# Patient Record
Sex: Female | Born: 1935 | Race: Black or African American | Hispanic: No | Smoking: Former smoker
Health system: Southern US, Community
[De-identification: ages and names within clinical notes are randomized; demographics above are authoritative.]

## PROBLEM LIST (undated history)

## (undated) DIAGNOSIS — M791 Myalgia, unspecified site: Secondary | ICD-10-CM

## (undated) DIAGNOSIS — I639 Cerebral infarction, unspecified: Secondary | ICD-10-CM

## (undated) DIAGNOSIS — I442 Atrioventricular block, complete: Secondary | ICD-10-CM

## (undated) DIAGNOSIS — Z8542 Personal history of malignant neoplasm of other parts of uterus: Secondary | ICD-10-CM

## (undated) DIAGNOSIS — N183 Chronic kidney disease, stage 3 unspecified: Secondary | ICD-10-CM

## (undated) DIAGNOSIS — I509 Heart failure, unspecified: Secondary | ICD-10-CM

## (undated) DIAGNOSIS — I219 Acute myocardial infarction, unspecified: Secondary | ICD-10-CM

## (undated) DIAGNOSIS — M109 Gout, unspecified: Secondary | ICD-10-CM

## (undated) DIAGNOSIS — D509 Iron deficiency anemia, unspecified: Secondary | ICD-10-CM

## (undated) DIAGNOSIS — R002 Palpitations: Secondary | ICD-10-CM

## (undated) DIAGNOSIS — E1121 Type 2 diabetes mellitus with diabetic nephropathy: Secondary | ICD-10-CM

## (undated) DIAGNOSIS — E785 Hyperlipidemia, unspecified: Secondary | ICD-10-CM

## (undated) DIAGNOSIS — J439 Emphysema, unspecified: Secondary | ICD-10-CM

## (undated) DIAGNOSIS — M1990 Unspecified osteoarthritis, unspecified site: Secondary | ICD-10-CM

## (undated) DIAGNOSIS — H9190 Unspecified hearing loss, unspecified ear: Secondary | ICD-10-CM

## (undated) DIAGNOSIS — I519 Heart disease, unspecified: Secondary | ICD-10-CM

## (undated) DIAGNOSIS — G4730 Sleep apnea, unspecified: Secondary | ICD-10-CM

## (undated) DIAGNOSIS — K219 Gastro-esophageal reflux disease without esophagitis: Secondary | ICD-10-CM

## (undated) DIAGNOSIS — Z860100 Personal history of colon polyps, unspecified: Secondary | ICD-10-CM

## (undated) DIAGNOSIS — I10 Essential (primary) hypertension: Secondary | ICD-10-CM

## (undated) DIAGNOSIS — I2510 Atherosclerotic heart disease of native coronary artery without angina pectoris: Secondary | ICD-10-CM

## (undated) HISTORY — DX: Personal history of colon polyps, unspecified: Z86.0100

## (undated) HISTORY — DX: Atrioventricular block, complete: I44.2

## (undated) HISTORY — DX: Hyperlipidemia, unspecified: E78.5

## (undated) HISTORY — DX: Atherosclerotic heart disease of native coronary artery without angina pectoris: I25.10

## (undated) HISTORY — DX: Acute myocardial infarction, unspecified: I21.9

## (undated) HISTORY — DX: Emphysema, unspecified: J43.9

## (undated) HISTORY — DX: Personal history of colonic polyps: Z86.010

## (undated) HISTORY — DX: Sleep apnea, unspecified: G47.30

## (undated) HISTORY — DX: Myalgia, unspecified site: M79.10

## (undated) HISTORY — PX: CERVICAL DISCECTOMY: SHX98

## (undated) HISTORY — DX: Gout, unspecified: M10.9

## (undated) HISTORY — DX: Iron deficiency anemia, unspecified: D50.9

## (undated) HISTORY — PX: ECTOPIC PREGNANCY SURGERY: SHX613

## (undated) HISTORY — PX: FOOT SURGERY: SHX648

## (undated) HISTORY — DX: Cerebral infarction, unspecified: I63.9

## (undated) HISTORY — PX: CORONARY ANGIOPLASTY: SHX604

## (undated) HISTORY — DX: Palpitations: R00.2

## (undated) HISTORY — DX: Gastro-esophageal reflux disease without esophagitis: K21.9

## (undated) HISTORY — DX: Unspecified osteoarthritis, unspecified site: M19.90

---

## 1996-11-19 DIAGNOSIS — I219 Acute myocardial infarction, unspecified: Secondary | ICD-10-CM

## 1996-11-19 HISTORY — DX: Acute myocardial infarction, unspecified: I21.9

## 2004-10-09 ENCOUNTER — Emergency Department (HOSPITAL_COMMUNITY): Admission: EM | Admit: 2004-10-09 | Discharge: 2004-10-09 | Payer: Self-pay | Admitting: Emergency Medicine

## 2004-11-10 ENCOUNTER — Ambulatory Visit (HOSPITAL_COMMUNITY): Admission: RE | Admit: 2004-11-10 | Discharge: 2004-11-11 | Payer: Self-pay | Admitting: *Deleted

## 2004-12-28 ENCOUNTER — Encounter (HOSPITAL_COMMUNITY): Admission: RE | Admit: 2004-12-28 | Discharge: 2005-01-27 | Payer: Self-pay | Admitting: *Deleted

## 2005-01-10 ENCOUNTER — Emergency Department (HOSPITAL_COMMUNITY): Admission: EM | Admit: 2005-01-10 | Discharge: 2005-01-10 | Payer: Self-pay | Admitting: Emergency Medicine

## 2005-03-14 ENCOUNTER — Ambulatory Visit (HOSPITAL_COMMUNITY): Admission: RE | Admit: 2005-03-14 | Discharge: 2005-03-14 | Payer: Self-pay | Admitting: Family Medicine

## 2005-12-07 ENCOUNTER — Ambulatory Visit (HOSPITAL_COMMUNITY): Admission: RE | Admit: 2005-12-07 | Discharge: 2005-12-07 | Payer: Self-pay | Admitting: *Deleted

## 2005-12-20 ENCOUNTER — Inpatient Hospital Stay (HOSPITAL_COMMUNITY): Admission: RE | Admit: 2005-12-20 | Discharge: 2005-12-22 | Payer: Self-pay | Admitting: *Deleted

## 2005-12-20 ENCOUNTER — Encounter (INDEPENDENT_AMBULATORY_CARE_PROVIDER_SITE_OTHER): Payer: Self-pay | Admitting: Cardiology

## 2006-02-25 ENCOUNTER — Ambulatory Visit: Admission: RE | Admit: 2006-02-25 | Discharge: 2006-02-25 | Payer: Self-pay | Admitting: *Deleted

## 2006-03-14 ENCOUNTER — Ambulatory Visit: Payer: Self-pay | Admitting: Pulmonary Disease

## 2006-03-18 ENCOUNTER — Ambulatory Visit (HOSPITAL_COMMUNITY): Admission: RE | Admit: 2006-03-18 | Discharge: 2006-03-18 | Payer: Self-pay | Admitting: Family Medicine

## 2006-03-18 ENCOUNTER — Ambulatory Visit (HOSPITAL_COMMUNITY): Admission: RE | Admit: 2006-03-18 | Discharge: 2006-03-18 | Payer: Self-pay | Admitting: *Deleted

## 2006-05-30 ENCOUNTER — Ambulatory Visit (HOSPITAL_COMMUNITY): Admission: RE | Admit: 2006-05-30 | Discharge: 2006-05-30 | Payer: Self-pay | Admitting: Family Medicine

## 2006-06-24 ENCOUNTER — Ambulatory Visit: Payer: Self-pay | Admitting: Family Medicine

## 2006-06-24 LAB — CONVERTED CEMR LAB
HCT: 37 %
Hemoglobin: 11.7 g/dL
MCHC: 31.6 g/dL
RDW: 16.5 %

## 2006-06-26 ENCOUNTER — Encounter (INDEPENDENT_AMBULATORY_CARE_PROVIDER_SITE_OTHER): Payer: Self-pay | Admitting: Family Medicine

## 2006-06-26 LAB — CONVERTED CEMR LAB
Ferritin: 25 ng/mL
Saturation Ratios: 23 %
TIBC: 424 ug/dL
UIBC: 325 ug/dL

## 2006-06-27 ENCOUNTER — Encounter (INDEPENDENT_AMBULATORY_CARE_PROVIDER_SITE_OTHER): Payer: Self-pay | Admitting: Family Medicine

## 2006-07-02 ENCOUNTER — Ambulatory Visit: Payer: Self-pay | Admitting: Gastroenterology

## 2006-07-08 ENCOUNTER — Ambulatory Visit: Payer: Self-pay | Admitting: Family Medicine

## 2006-08-06 ENCOUNTER — Ambulatory Visit (HOSPITAL_COMMUNITY): Admission: RE | Admit: 2006-08-06 | Discharge: 2006-08-06 | Payer: Self-pay | Admitting: Family Medicine

## 2006-08-06 ENCOUNTER — Ambulatory Visit: Payer: Self-pay | Admitting: Family Medicine

## 2006-08-26 ENCOUNTER — Ambulatory Visit: Payer: Self-pay | Admitting: Family Medicine

## 2006-09-19 HISTORY — PX: COLONOSCOPY: SHX174

## 2006-10-07 ENCOUNTER — Observation Stay (HOSPITAL_COMMUNITY): Admission: RE | Admit: 2006-10-07 | Discharge: 2006-10-08 | Payer: Self-pay | Admitting: Gastroenterology

## 2006-10-07 ENCOUNTER — Ambulatory Visit: Payer: Self-pay | Admitting: Gastroenterology

## 2006-10-24 ENCOUNTER — Ambulatory Visit: Payer: Self-pay | Admitting: Family Medicine

## 2006-10-24 ENCOUNTER — Ambulatory Visit: Payer: Self-pay | Admitting: Gastroenterology

## 2006-10-29 ENCOUNTER — Encounter: Payer: Self-pay | Admitting: Family Medicine

## 2006-10-29 DIAGNOSIS — I10 Essential (primary) hypertension: Secondary | ICD-10-CM | POA: Insufficient documentation

## 2006-10-29 DIAGNOSIS — I2510 Atherosclerotic heart disease of native coronary artery without angina pectoris: Secondary | ICD-10-CM

## 2006-10-29 DIAGNOSIS — I252 Old myocardial infarction: Secondary | ICD-10-CM

## 2006-10-29 DIAGNOSIS — J309 Allergic rhinitis, unspecified: Secondary | ICD-10-CM | POA: Insufficient documentation

## 2006-10-29 DIAGNOSIS — Z860100 Personal history of colon polyps, unspecified: Secondary | ICD-10-CM | POA: Insufficient documentation

## 2006-10-29 DIAGNOSIS — E782 Mixed hyperlipidemia: Secondary | ICD-10-CM

## 2006-10-29 DIAGNOSIS — K219 Gastro-esophageal reflux disease without esophagitis: Secondary | ICD-10-CM

## 2006-10-29 DIAGNOSIS — M545 Low back pain: Secondary | ICD-10-CM

## 2006-10-29 DIAGNOSIS — M1990 Unspecified osteoarthritis, unspecified site: Secondary | ICD-10-CM | POA: Insufficient documentation

## 2006-10-29 DIAGNOSIS — K5909 Other constipation: Secondary | ICD-10-CM

## 2006-11-15 ENCOUNTER — Ambulatory Visit (HOSPITAL_COMMUNITY): Admission: RE | Admit: 2006-11-15 | Discharge: 2006-11-15 | Payer: Self-pay | Admitting: *Deleted

## 2006-11-21 ENCOUNTER — Inpatient Hospital Stay (HOSPITAL_COMMUNITY): Admission: RE | Admit: 2006-11-21 | Discharge: 2006-11-23 | Payer: Self-pay | Admitting: *Deleted

## 2006-12-09 ENCOUNTER — Emergency Department (HOSPITAL_COMMUNITY): Admission: EM | Admit: 2006-12-09 | Discharge: 2006-12-09 | Payer: Self-pay | Admitting: Emergency Medicine

## 2007-01-08 ENCOUNTER — Encounter (INDEPENDENT_AMBULATORY_CARE_PROVIDER_SITE_OTHER): Payer: Self-pay | Admitting: Family Medicine

## 2007-01-24 ENCOUNTER — Encounter (INDEPENDENT_AMBULATORY_CARE_PROVIDER_SITE_OTHER): Payer: Self-pay | Admitting: Family Medicine

## 2007-01-24 ENCOUNTER — Ambulatory Visit: Payer: Self-pay | Admitting: Gastroenterology

## 2007-01-28 ENCOUNTER — Encounter (HOSPITAL_COMMUNITY): Admission: RE | Admit: 2007-01-28 | Discharge: 2007-02-27 | Payer: Self-pay | Admitting: *Deleted

## 2007-02-06 ENCOUNTER — Encounter (INDEPENDENT_AMBULATORY_CARE_PROVIDER_SITE_OTHER): Payer: Self-pay | Admitting: Family Medicine

## 2007-02-10 ENCOUNTER — Encounter: Payer: Self-pay | Admitting: Internal Medicine

## 2007-02-12 ENCOUNTER — Inpatient Hospital Stay (HOSPITAL_COMMUNITY): Admission: AD | Admit: 2007-02-12 | Discharge: 2007-02-24 | Payer: Self-pay | Admitting: Neurosurgery

## 2007-03-31 ENCOUNTER — Encounter (HOSPITAL_COMMUNITY): Admission: RE | Admit: 2007-03-31 | Discharge: 2007-04-30 | Payer: Self-pay | Admitting: Neurosurgery

## 2007-04-03 ENCOUNTER — Ambulatory Visit (HOSPITAL_COMMUNITY): Admission: RE | Admit: 2007-04-03 | Discharge: 2007-04-03 | Payer: Self-pay | Admitting: Family Medicine

## 2007-04-07 ENCOUNTER — Encounter (INDEPENDENT_AMBULATORY_CARE_PROVIDER_SITE_OTHER): Payer: Self-pay | Admitting: Family Medicine

## 2007-04-08 ENCOUNTER — Ambulatory Visit: Payer: Self-pay | Admitting: Family Medicine

## 2007-04-08 DIAGNOSIS — M5137 Other intervertebral disc degeneration, lumbosacral region: Secondary | ICD-10-CM

## 2007-05-06 ENCOUNTER — Encounter (INDEPENDENT_AMBULATORY_CARE_PROVIDER_SITE_OTHER): Payer: Self-pay | Admitting: Family Medicine

## 2007-05-07 ENCOUNTER — Encounter (INDEPENDENT_AMBULATORY_CARE_PROVIDER_SITE_OTHER): Payer: Self-pay | Admitting: Family Medicine

## 2007-05-12 ENCOUNTER — Encounter (HOSPITAL_COMMUNITY): Admission: RE | Admit: 2007-05-12 | Discharge: 2007-06-11 | Payer: Self-pay | Admitting: Neurosurgery

## 2007-05-14 ENCOUNTER — Encounter (INDEPENDENT_AMBULATORY_CARE_PROVIDER_SITE_OTHER): Payer: Self-pay | Admitting: Family Medicine

## 2007-05-14 ENCOUNTER — Ambulatory Visit (HOSPITAL_COMMUNITY): Admission: RE | Admit: 2007-05-14 | Discharge: 2007-05-14 | Payer: Self-pay | Admitting: *Deleted

## 2007-07-04 ENCOUNTER — Encounter (INDEPENDENT_AMBULATORY_CARE_PROVIDER_SITE_OTHER): Payer: Self-pay | Admitting: Family Medicine

## 2007-07-22 ENCOUNTER — Ambulatory Visit (HOSPITAL_COMMUNITY): Admission: RE | Admit: 2007-07-22 | Discharge: 2007-07-22 | Payer: Self-pay | Admitting: Family Medicine

## 2007-07-23 ENCOUNTER — Encounter (INDEPENDENT_AMBULATORY_CARE_PROVIDER_SITE_OTHER): Payer: Self-pay | Admitting: Family Medicine

## 2007-08-12 ENCOUNTER — Ambulatory Visit: Payer: Self-pay | Admitting: Family Medicine

## 2007-08-12 LAB — CONVERTED CEMR LAB
Hgb A1c MFr Bld: 7 %
LDL Goal: 70 mg/dL

## 2007-08-21 ENCOUNTER — Encounter (INDEPENDENT_AMBULATORY_CARE_PROVIDER_SITE_OTHER): Payer: Self-pay | Admitting: Family Medicine

## 2007-08-25 LAB — CONVERTED CEMR LAB
BUN: 24 mg/dL — ABNORMAL HIGH (ref 6–23)
Basophils Absolute: 0 10*3/uL (ref 0.0–0.1)
Basophils Relative: 0 % (ref 0–1)
Calcium: 9.3 mg/dL (ref 8.4–10.5)
Creatinine, Ser: 1.21 mg/dL — ABNORMAL HIGH (ref 0.40–1.20)
Creatinine, Urine: 84.5 mg/dL
HCT: 34.2 % — ABNORMAL LOW (ref 36.0–46.0)
HDL: 32 mg/dL — ABNORMAL LOW (ref >39)
Lymphocytes Relative: 42 % (ref 12–46)
Microalb Creat Ratio: 43 mg/g — ABNORMAL HIGH (ref 0.0–30.0)
Monocytes Absolute: 0.7 10*3/uL (ref 0.2–0.7)
Platelets: 251 10*3/uL (ref 150–400)
Potassium: 4.3 meq/L (ref 3.5–5.3)
RBC: 4.45 M/uL (ref 3.87–5.11)
Sodium: 144 meq/L (ref 135–145)
TSH: 4.168 microintl units/mL (ref 0.350–5.50)
Total CHOL/HDL Ratio: 3.7
Triglycerides: 82 mg/dL (ref <150)
WBC: 6.7 10*3/uL (ref 4.0–10.5)

## 2007-09-19 ENCOUNTER — Ambulatory Visit (HOSPITAL_COMMUNITY): Admission: RE | Admit: 2007-09-19 | Discharge: 2007-09-19 | Payer: Self-pay | Admitting: Neurosurgery

## 2007-09-23 ENCOUNTER — Ambulatory Visit: Payer: Self-pay | Admitting: Family Medicine

## 2007-09-23 DIAGNOSIS — R809 Proteinuria, unspecified: Secondary | ICD-10-CM | POA: Insufficient documentation

## 2007-09-23 DIAGNOSIS — D539 Nutritional anemia, unspecified: Secondary | ICD-10-CM

## 2007-09-23 LAB — CONVERTED CEMR LAB: Hemoglobin: 9.5 g/dL

## 2007-09-24 ENCOUNTER — Telehealth (INDEPENDENT_AMBULATORY_CARE_PROVIDER_SITE_OTHER): Payer: Self-pay | Admitting: *Deleted

## 2007-09-24 ENCOUNTER — Encounter (INDEPENDENT_AMBULATORY_CARE_PROVIDER_SITE_OTHER): Payer: Self-pay | Admitting: Family Medicine

## 2007-09-24 LAB — CONVERTED CEMR LAB
Iron: 112 ug/dL (ref 42–145)
Retic Ct Pct: 1.3 % (ref 0.4–3.1)
TIBC: 458 ug/dL (ref 250–470)
UIBC: 346 ug/dL

## 2007-09-30 ENCOUNTER — Telehealth (INDEPENDENT_AMBULATORY_CARE_PROVIDER_SITE_OTHER): Payer: Self-pay | Admitting: *Deleted

## 2007-10-09 ENCOUNTER — Encounter (INDEPENDENT_AMBULATORY_CARE_PROVIDER_SITE_OTHER): Payer: Self-pay | Admitting: Family Medicine

## 2007-10-09 ENCOUNTER — Ambulatory Visit (HOSPITAL_COMMUNITY): Admission: RE | Admit: 2007-10-09 | Discharge: 2007-10-09 | Payer: Self-pay | Admitting: *Deleted

## 2007-11-10 ENCOUNTER — Ambulatory Visit: Payer: Self-pay | Admitting: Family Medicine

## 2007-11-10 DIAGNOSIS — R002 Palpitations: Secondary | ICD-10-CM | POA: Insufficient documentation

## 2007-11-11 ENCOUNTER — Encounter (INDEPENDENT_AMBULATORY_CARE_PROVIDER_SITE_OTHER): Payer: Self-pay | Admitting: Family Medicine

## 2007-11-11 ENCOUNTER — Telehealth (INDEPENDENT_AMBULATORY_CARE_PROVIDER_SITE_OTHER): Payer: Self-pay | Admitting: *Deleted

## 2007-11-12 ENCOUNTER — Encounter (INDEPENDENT_AMBULATORY_CARE_PROVIDER_SITE_OTHER): Payer: Self-pay | Admitting: Family Medicine

## 2007-11-20 DIAGNOSIS — I2510 Atherosclerotic heart disease of native coronary artery without angina pectoris: Secondary | ICD-10-CM

## 2007-11-20 HISTORY — DX: Atherosclerotic heart disease of native coronary artery without angina pectoris: I25.10

## 2007-11-24 ENCOUNTER — Telehealth (INDEPENDENT_AMBULATORY_CARE_PROVIDER_SITE_OTHER): Payer: Self-pay | Admitting: Family Medicine

## 2007-12-02 ENCOUNTER — Ambulatory Visit (HOSPITAL_COMMUNITY): Admission: RE | Admit: 2007-12-02 | Discharge: 2007-12-02 | Payer: Self-pay | Admitting: Family Medicine

## 2007-12-02 ENCOUNTER — Encounter (INDEPENDENT_AMBULATORY_CARE_PROVIDER_SITE_OTHER): Payer: Self-pay | Admitting: Family Medicine

## 2007-12-09 ENCOUNTER — Ambulatory Visit: Payer: Self-pay | Admitting: Family Medicine

## 2007-12-10 ENCOUNTER — Telehealth (INDEPENDENT_AMBULATORY_CARE_PROVIDER_SITE_OTHER): Payer: Self-pay | Admitting: *Deleted

## 2007-12-10 ENCOUNTER — Encounter (INDEPENDENT_AMBULATORY_CARE_PROVIDER_SITE_OTHER): Payer: Self-pay | Admitting: Family Medicine

## 2007-12-10 LAB — CONVERTED CEMR LAB
Basophils Absolute: 0 10*3/uL (ref 0.0–0.1)
Basophils Relative: 0 % (ref 0–1)
Eosinophils Absolute: 0.2 10*3/uL (ref 0.0–0.7)
Hemoglobin: 10.3 g/dL — ABNORMAL LOW (ref 12.0–15.0)
MCHC: 31.1 g/dL (ref 30.0–36.0)
MCV: 76.1 fL — ABNORMAL LOW (ref 78.0–100.0)
Monocytes Absolute: 0.6 10*3/uL (ref 0.1–1.0)
Neutro Abs: 2.2 10*3/uL (ref 1.7–7.7)
Neutrophils Relative %: 42 % — ABNORMAL LOW (ref 43–77)
RDW: 15.9 % — ABNORMAL HIGH (ref 11.5–15.5)

## 2007-12-12 ENCOUNTER — Telehealth (INDEPENDENT_AMBULATORY_CARE_PROVIDER_SITE_OTHER): Payer: Self-pay | Admitting: *Deleted

## 2007-12-12 ENCOUNTER — Encounter (INDEPENDENT_AMBULATORY_CARE_PROVIDER_SITE_OTHER): Payer: Self-pay | Admitting: Family Medicine

## 2007-12-15 ENCOUNTER — Encounter (INDEPENDENT_AMBULATORY_CARE_PROVIDER_SITE_OTHER): Payer: Self-pay | Admitting: Family Medicine

## 2008-01-16 ENCOUNTER — Ambulatory Visit: Payer: Self-pay | Admitting: Family Medicine

## 2008-01-16 DIAGNOSIS — IMO0001 Reserved for inherently not codable concepts without codable children: Secondary | ICD-10-CM | POA: Insufficient documentation

## 2008-01-27 ENCOUNTER — Ambulatory Visit (HOSPITAL_COMMUNITY): Admission: RE | Admit: 2008-01-27 | Discharge: 2008-01-27 | Payer: Self-pay | Admitting: *Deleted

## 2008-02-02 ENCOUNTER — Encounter (INDEPENDENT_AMBULATORY_CARE_PROVIDER_SITE_OTHER): Payer: Self-pay | Admitting: Family Medicine

## 2008-02-06 ENCOUNTER — Encounter (INDEPENDENT_AMBULATORY_CARE_PROVIDER_SITE_OTHER): Payer: Self-pay | Admitting: Family Medicine

## 2008-02-26 ENCOUNTER — Encounter (INDEPENDENT_AMBULATORY_CARE_PROVIDER_SITE_OTHER): Payer: Self-pay | Admitting: Family Medicine

## 2008-03-05 ENCOUNTER — Inpatient Hospital Stay (HOSPITAL_COMMUNITY): Admission: EM | Admit: 2008-03-05 | Discharge: 2008-03-14 | Payer: Self-pay | Admitting: Emergency Medicine

## 2008-03-05 ENCOUNTER — Ambulatory Visit: Payer: Self-pay | Admitting: Emergency Medicine

## 2008-03-09 ENCOUNTER — Encounter (INDEPENDENT_AMBULATORY_CARE_PROVIDER_SITE_OTHER): Payer: Self-pay | Admitting: *Deleted

## 2008-03-09 ENCOUNTER — Ambulatory Visit: Payer: Self-pay | Admitting: Vascular Surgery

## 2008-04-21 ENCOUNTER — Encounter (INDEPENDENT_AMBULATORY_CARE_PROVIDER_SITE_OTHER): Payer: Self-pay | Admitting: Family Medicine

## 2008-04-26 ENCOUNTER — Ambulatory Visit: Admission: RE | Admit: 2008-04-26 | Discharge: 2008-04-26 | Payer: Self-pay | Admitting: *Deleted

## 2008-04-30 ENCOUNTER — Ambulatory Visit: Payer: Self-pay | Admitting: Family Medicine

## 2008-04-30 DIAGNOSIS — G4733 Obstructive sleep apnea (adult) (pediatric): Secondary | ICD-10-CM

## 2008-04-30 DIAGNOSIS — I519 Heart disease, unspecified: Secondary | ICD-10-CM

## 2008-04-30 LAB — CONVERTED CEMR LAB: Glucose, Bld: 225 mg/dL

## 2008-05-05 ENCOUNTER — Ambulatory Visit (HOSPITAL_COMMUNITY): Admission: RE | Admit: 2008-05-05 | Discharge: 2008-05-05 | Payer: Self-pay | Admitting: Family Medicine

## 2008-05-10 ENCOUNTER — Ambulatory Visit: Payer: Self-pay | Admitting: Pulmonary Disease

## 2008-05-14 ENCOUNTER — Encounter (INDEPENDENT_AMBULATORY_CARE_PROVIDER_SITE_OTHER): Payer: Self-pay | Admitting: Family Medicine

## 2008-05-15 ENCOUNTER — Encounter: Admission: RE | Admit: 2008-05-15 | Discharge: 2008-05-15 | Payer: Self-pay | Admitting: Neurosurgery

## 2008-05-18 ENCOUNTER — Ambulatory Visit: Payer: Self-pay | Admitting: Family Medicine

## 2008-05-19 ENCOUNTER — Encounter (INDEPENDENT_AMBULATORY_CARE_PROVIDER_SITE_OTHER): Payer: Self-pay | Admitting: Family Medicine

## 2008-05-19 LAB — CONVERTED CEMR LAB
Basophils Relative: 1 % (ref 0–1)
Eosinophils Absolute: 0.3 10*3/uL (ref 0.0–0.7)
Eosinophils Relative: 5 % (ref 0–5)
HCT: 35.3 % — ABNORMAL LOW (ref 36.0–46.0)
Lymphs Abs: 2.2 10*3/uL (ref 0.7–4.0)
MCHC: 30 g/dL (ref 30.0–36.0)
MCV: 75.4 fL — ABNORMAL LOW (ref 78.0–100.0)
Monocytes Absolute: 0.6 10*3/uL (ref 0.1–1.0)
Monocytes Relative: 10 % (ref 3–12)
Neutrophils Relative %: 48 % (ref 43–77)
RBC: 4.68 M/uL (ref 3.87–5.11)
WBC: 6 10*3/uL (ref 4.0–10.5)

## 2008-05-26 ENCOUNTER — Encounter: Admission: RE | Admit: 2008-05-26 | Discharge: 2008-05-26 | Payer: Self-pay | Admitting: Pulmonary Disease

## 2008-07-29 ENCOUNTER — Encounter (INDEPENDENT_AMBULATORY_CARE_PROVIDER_SITE_OTHER): Payer: Self-pay | Admitting: Family Medicine

## 2008-08-17 ENCOUNTER — Encounter (INDEPENDENT_AMBULATORY_CARE_PROVIDER_SITE_OTHER): Payer: Self-pay | Admitting: Family Medicine

## 2008-09-08 ENCOUNTER — Encounter: Admission: RE | Admit: 2008-09-08 | Discharge: 2008-09-08 | Payer: Self-pay | Admitting: Neurosurgery

## 2008-09-20 ENCOUNTER — Encounter: Admission: RE | Admit: 2008-09-20 | Discharge: 2008-09-20 | Payer: Self-pay | Admitting: Neurosurgery

## 2008-09-23 ENCOUNTER — Ambulatory Visit (HOSPITAL_COMMUNITY): Admission: RE | Admit: 2008-09-23 | Discharge: 2008-09-23 | Payer: Self-pay | Admitting: *Deleted

## 2008-09-24 ENCOUNTER — Ambulatory Visit: Payer: Self-pay | Admitting: Family Medicine

## 2008-09-24 LAB — CONVERTED CEMR LAB
Glucose, Bld: 350 mg/dL
Hgb A1c MFr Bld: 10.3 %

## 2008-09-27 ENCOUNTER — Encounter (INDEPENDENT_AMBULATORY_CARE_PROVIDER_SITE_OTHER): Payer: Self-pay | Admitting: Family Medicine

## 2008-11-24 ENCOUNTER — Ambulatory Visit (HOSPITAL_COMMUNITY): Admission: RE | Admit: 2008-11-24 | Discharge: 2008-11-24 | Payer: Self-pay | Admitting: Family Medicine

## 2008-12-03 ENCOUNTER — Ambulatory Visit (HOSPITAL_COMMUNITY): Admission: RE | Admit: 2008-12-03 | Discharge: 2008-12-03 | Payer: Self-pay | Admitting: Family Medicine

## 2008-12-03 ENCOUNTER — Ambulatory Visit: Payer: Self-pay | Admitting: Family Medicine

## 2008-12-16 ENCOUNTER — Ambulatory Visit: Payer: Self-pay | Admitting: Family Medicine

## 2008-12-16 DIAGNOSIS — J42 Unspecified chronic bronchitis: Secondary | ICD-10-CM

## 2008-12-17 ENCOUNTER — Encounter (INDEPENDENT_AMBULATORY_CARE_PROVIDER_SITE_OTHER): Payer: Self-pay | Admitting: Family Medicine

## 2008-12-21 ENCOUNTER — Encounter (INDEPENDENT_AMBULATORY_CARE_PROVIDER_SITE_OTHER): Payer: Self-pay | Admitting: Family Medicine

## 2008-12-21 ENCOUNTER — Ambulatory Visit (HOSPITAL_COMMUNITY): Admission: RE | Admit: 2008-12-21 | Discharge: 2008-12-21 | Payer: Self-pay | Admitting: Family Medicine

## 2008-12-23 ENCOUNTER — Encounter (INDEPENDENT_AMBULATORY_CARE_PROVIDER_SITE_OTHER): Payer: Self-pay | Admitting: Family Medicine

## 2008-12-30 ENCOUNTER — Ambulatory Visit: Payer: Self-pay | Admitting: Family Medicine

## 2008-12-30 DIAGNOSIS — I2789 Other specified pulmonary heart diseases: Secondary | ICD-10-CM | POA: Insufficient documentation

## 2009-01-18 ENCOUNTER — Encounter (INDEPENDENT_AMBULATORY_CARE_PROVIDER_SITE_OTHER): Payer: Self-pay | Admitting: Family Medicine

## 2009-02-09 ENCOUNTER — Encounter (INDEPENDENT_AMBULATORY_CARE_PROVIDER_SITE_OTHER): Payer: Self-pay | Admitting: Family Medicine

## 2009-02-10 ENCOUNTER — Ambulatory Visit: Payer: Self-pay | Admitting: Family Medicine

## 2009-02-24 ENCOUNTER — Ambulatory Visit: Payer: Self-pay | Admitting: Family Medicine

## 2009-02-24 DIAGNOSIS — J4489 Other specified chronic obstructive pulmonary disease: Secondary | ICD-10-CM | POA: Insufficient documentation

## 2009-02-24 DIAGNOSIS — J449 Chronic obstructive pulmonary disease, unspecified: Secondary | ICD-10-CM

## 2009-03-01 LAB — CONVERTED CEMR LAB: Cholesterol: 147 mg/dL

## 2009-03-24 ENCOUNTER — Ambulatory Visit: Payer: Self-pay | Admitting: Family Medicine

## 2009-04-14 ENCOUNTER — Ambulatory Visit: Payer: Self-pay | Admitting: Family Medicine

## 2009-04-14 DIAGNOSIS — R42 Dizziness and giddiness: Secondary | ICD-10-CM

## 2009-04-14 LAB — CONVERTED CEMR LAB
Bilirubin Urine: NEGATIVE
Blood Glucose, Fingerstick: 413
Blood in Urine, dipstick: NEGATIVE
Specific Gravity, Urine: 1.02
Urobilinogen, UA: 0.2
WBC Urine, dipstick: NEGATIVE

## 2009-04-15 ENCOUNTER — Encounter (INDEPENDENT_AMBULATORY_CARE_PROVIDER_SITE_OTHER): Payer: Self-pay | Admitting: Family Medicine

## 2009-04-15 LAB — CONVERTED CEMR LAB
Basophils Absolute: 0 10*3/uL (ref 0.0–0.1)
Basophils Relative: 0 % (ref 0–1)
CO2: 24 meq/L (ref 19–32)
Calcium: 9.4 mg/dL (ref 8.4–10.5)
Creatinine, Ser: 1.45 mg/dL — ABNORMAL HIGH (ref 0.40–1.20)
Eosinophils Absolute: 0.2 10*3/uL (ref 0.0–0.7)
Glucose, Bld: 331 mg/dL — ABNORMAL HIGH (ref 70–99)
MCHC: 30.4 g/dL (ref 30.0–36.0)
MCV: 76.4 fL — ABNORMAL LOW (ref 78.0–100.0)
Monocytes Relative: 12 % (ref 3–12)
Neutrophils Relative %: 53 % (ref 43–77)
Platelets: 235 10*3/uL (ref 150–400)
RBC: 5 M/uL (ref 3.87–5.11)
RDW: 17 % — ABNORMAL HIGH (ref 11.5–15.5)

## 2009-04-27 ENCOUNTER — Encounter (INDEPENDENT_AMBULATORY_CARE_PROVIDER_SITE_OTHER): Payer: Self-pay | Admitting: Family Medicine

## 2009-05-06 ENCOUNTER — Ambulatory Visit: Payer: Self-pay | Admitting: Family Medicine

## 2009-05-06 DIAGNOSIS — D239 Other benign neoplasm of skin, unspecified: Secondary | ICD-10-CM | POA: Insufficient documentation

## 2009-05-20 ENCOUNTER — Ambulatory Visit: Payer: Self-pay | Admitting: Family Medicine

## 2009-05-20 DIAGNOSIS — G609 Hereditary and idiopathic neuropathy, unspecified: Secondary | ICD-10-CM | POA: Insufficient documentation

## 2009-05-20 DIAGNOSIS — E669 Obesity, unspecified: Secondary | ICD-10-CM | POA: Insufficient documentation

## 2009-05-21 ENCOUNTER — Encounter (INDEPENDENT_AMBULATORY_CARE_PROVIDER_SITE_OTHER): Payer: Self-pay | Admitting: Family Medicine

## 2009-05-25 ENCOUNTER — Encounter (INDEPENDENT_AMBULATORY_CARE_PROVIDER_SITE_OTHER): Payer: Self-pay | Admitting: Family Medicine

## 2009-05-25 LAB — CONVERTED CEMR LAB
Creatinine, Urine: 372.7 mg/dL
Iron: 55 ug/dL (ref 42–145)
Lymphocytes Relative: 36 % (ref 12–46)
Lymphs Abs: 2.3 10*3/uL (ref 0.7–4.0)
Microalb, Ur: 122.62 mg/dL — ABNORMAL HIGH (ref 0.00–1.89)
Neutrophils Relative %: 52 % (ref 43–77)
Platelets: 209 10*3/uL (ref 150–400)
Saturation Ratios: 16 % — ABNORMAL LOW (ref 20–55)
UIBC: 298 ug/dL
WBC: 6.5 10*3/uL (ref 4.0–10.5)

## 2009-06-13 ENCOUNTER — Encounter (HOSPITAL_COMMUNITY): Admission: RE | Admit: 2009-06-13 | Discharge: 2009-07-13 | Payer: Self-pay | Admitting: Family Medicine

## 2009-06-24 ENCOUNTER — Ambulatory Visit: Payer: Self-pay | Admitting: Family Medicine

## 2009-07-18 ENCOUNTER — Encounter (HOSPITAL_COMMUNITY): Admission: RE | Admit: 2009-07-18 | Discharge: 2009-08-17 | Payer: Self-pay | Admitting: Family Medicine

## 2009-07-28 ENCOUNTER — Encounter (INDEPENDENT_AMBULATORY_CARE_PROVIDER_SITE_OTHER): Payer: Self-pay | Admitting: Family Medicine

## 2009-07-28 ENCOUNTER — Other Ambulatory Visit: Admission: RE | Admit: 2009-07-28 | Discharge: 2009-07-28 | Payer: Self-pay | Admitting: Family Medicine

## 2009-07-28 ENCOUNTER — Ambulatory Visit: Payer: Self-pay | Admitting: Family Medicine

## 2009-08-01 ENCOUNTER — Ambulatory Visit (HOSPITAL_COMMUNITY): Admission: RE | Admit: 2009-08-01 | Discharge: 2009-08-01 | Payer: Self-pay | Admitting: Family Medicine

## 2009-08-10 ENCOUNTER — Encounter (INDEPENDENT_AMBULATORY_CARE_PROVIDER_SITE_OTHER): Payer: Self-pay | Admitting: Family Medicine

## 2009-08-19 ENCOUNTER — Encounter (HOSPITAL_COMMUNITY): Admission: RE | Admit: 2009-08-19 | Discharge: 2009-09-18 | Payer: Self-pay | Admitting: Family Medicine

## 2009-09-19 ENCOUNTER — Encounter (HOSPITAL_COMMUNITY): Admission: RE | Admit: 2009-09-19 | Discharge: 2009-10-19 | Payer: Self-pay | Admitting: Family Medicine

## 2009-10-21 ENCOUNTER — Ambulatory Visit (HOSPITAL_COMMUNITY): Admission: RE | Admit: 2009-10-21 | Discharge: 2009-10-21 | Payer: Self-pay | Admitting: Cardiology

## 2009-10-24 ENCOUNTER — Ambulatory Visit: Payer: Self-pay | Admitting: Pulmonary Disease

## 2009-10-24 ENCOUNTER — Ambulatory Visit (HOSPITAL_COMMUNITY): Admission: RE | Admit: 2009-10-24 | Discharge: 2009-10-26 | Payer: Self-pay | Admitting: Cardiology

## 2009-11-17 ENCOUNTER — Encounter: Admission: RE | Admit: 2009-11-17 | Discharge: 2009-11-17 | Payer: Self-pay | Admitting: Family Medicine

## 2009-12-07 ENCOUNTER — Encounter: Admission: RE | Admit: 2009-12-07 | Discharge: 2009-12-07 | Payer: Self-pay | Admitting: Family Medicine

## 2010-01-09 ENCOUNTER — Ambulatory Visit (HOSPITAL_COMMUNITY): Admission: RE | Admit: 2010-01-09 | Discharge: 2010-01-09 | Payer: Self-pay | Admitting: Family Medicine

## 2010-01-18 ENCOUNTER — Encounter (HOSPITAL_COMMUNITY): Admission: RE | Admit: 2010-01-18 | Discharge: 2010-02-17 | Payer: Self-pay | Admitting: Family Medicine

## 2010-02-28 ENCOUNTER — Encounter: Admission: RE | Admit: 2010-02-28 | Discharge: 2010-02-28 | Payer: Self-pay | Admitting: Endocrinology

## 2010-04-18 ENCOUNTER — Ambulatory Visit (HOSPITAL_COMMUNITY): Admission: RE | Admit: 2010-04-18 | Discharge: 2010-04-18 | Payer: Self-pay | Admitting: Obstetrics and Gynecology

## 2010-04-18 ENCOUNTER — Encounter: Payer: Self-pay | Admitting: Obstetrics and Gynecology

## 2010-04-19 HISTORY — PX: ABDOMINAL HYSTERECTOMY: SHX81

## 2010-05-18 ENCOUNTER — Ambulatory Visit
Admission: RE | Admit: 2010-05-18 | Discharge: 2010-05-18 | Payer: Self-pay | Source: Home / Self Care | Admitting: Gynecologic Oncology

## 2010-06-07 ENCOUNTER — Inpatient Hospital Stay: Admission: AD | Admit: 2010-06-07 | Discharge: 2010-06-29 | Payer: Self-pay | Admitting: Internal Medicine

## 2010-06-12 ENCOUNTER — Ambulatory Visit (HOSPITAL_COMMUNITY): Admission: RE | Admit: 2010-06-12 | Discharge: 2010-06-12 | Payer: Self-pay | Admitting: Internal Medicine

## 2010-06-22 ENCOUNTER — Ambulatory Visit: Admission: RE | Admit: 2010-06-22 | Discharge: 2010-06-22 | Payer: Self-pay | Admitting: Gynecologic Oncology

## 2010-09-05 ENCOUNTER — Ambulatory Visit (HOSPITAL_COMMUNITY): Admission: RE | Admit: 2010-09-05 | Discharge: 2010-09-05 | Payer: Self-pay | Admitting: Family Medicine

## 2010-09-07 ENCOUNTER — Ambulatory Visit: Admission: RE | Admit: 2010-09-07 | Discharge: 2010-09-07 | Payer: Self-pay | Admitting: Gynecologic Oncology

## 2010-12-10 ENCOUNTER — Encounter: Payer: Self-pay | Admitting: Pulmonary Disease

## 2010-12-10 ENCOUNTER — Encounter: Payer: Self-pay | Admitting: Family Medicine

## 2010-12-11 ENCOUNTER — Encounter: Payer: Self-pay | Admitting: Family Medicine

## 2010-12-17 LAB — CONVERTED CEMR LAB
ALT: 28 units/L (ref 0–35)
BUN: 15 mg/dL (ref 6–23)
CO2: 32 meq/L (ref 19–32)
Calcium: 9.3 mg/dL (ref 8.4–10.5)
Chloride: 101 meq/L (ref 96–112)
Creatinine, Ser: 1.19 mg/dL (ref 0.40–1.20)
Glucose, Bld: 144 mg/dL — ABNORMAL HIGH (ref 70–99)
Glucose, Bld: 160 mg/dL
Pro B Natriuretic peptide (BNP): 184 pg/mL — ABNORMAL HIGH (ref 0.0–100.0)
TSH: 3.69 microintl units/mL (ref 0.350–4.50)
Total Bilirubin: 0.4 mg/dL (ref 0.3–1.2)

## 2010-12-27 ENCOUNTER — Ambulatory Visit (HOSPITAL_COMMUNITY)
Admission: RE | Admit: 2010-12-27 | Discharge: 2010-12-27 | Disposition: A | Discharge status: Home / Self Care | Payer: Medicare Other | Source: Ambulatory Visit | Attending: Family Medicine | Admitting: Family Medicine

## 2010-12-27 ENCOUNTER — Other Ambulatory Visit (HOSPITAL_COMMUNITY): Payer: Self-pay | Admitting: Family Medicine

## 2010-12-27 DIAGNOSIS — R52 Pain, unspecified: Secondary | ICD-10-CM

## 2010-12-27 DIAGNOSIS — S6990XA Unspecified injury of unspecified wrist, hand and finger(s), initial encounter: Secondary | ICD-10-CM | POA: Insufficient documentation

## 2010-12-27 DIAGNOSIS — M79609 Pain in unspecified limb: Secondary | ICD-10-CM | POA: Insufficient documentation

## 2010-12-27 DIAGNOSIS — X58XXXA Exposure to other specified factors, initial encounter: Secondary | ICD-10-CM | POA: Insufficient documentation

## 2011-02-02 LAB — GLUCOSE, CAPILLARY
Glucose-Capillary: 267 mg/dL — ABNORMAL HIGH (ref 70–99)
Glucose-Capillary: 282 mg/dL — ABNORMAL HIGH (ref 70–99)
Glucose-Capillary: 287 mg/dL — ABNORMAL HIGH (ref 70–99)
Glucose-Capillary: 305 mg/dL — ABNORMAL HIGH (ref 70–99)
Glucose-Capillary: 323 mg/dL — ABNORMAL HIGH (ref 70–99)
Glucose-Capillary: 339 mg/dL — ABNORMAL HIGH (ref 70–99)
Glucose-Capillary: 351 mg/dL — ABNORMAL HIGH (ref 70–99)
Glucose-Capillary: 361 mg/dL — ABNORMAL HIGH (ref 70–99)
Glucose-Capillary: 364 mg/dL — ABNORMAL HIGH (ref 70–99)
Glucose-Capillary: 368 mg/dL — ABNORMAL HIGH (ref 70–99)
Glucose-Capillary: 377 mg/dL — ABNORMAL HIGH (ref 70–99)
Glucose-Capillary: 388 mg/dL — ABNORMAL HIGH (ref 70–99)
Glucose-Capillary: 392 mg/dL — ABNORMAL HIGH (ref 70–99)
Glucose-Capillary: 400 mg/dL — ABNORMAL HIGH (ref 70–99)
Glucose-Capillary: 405 mg/dL — ABNORMAL HIGH (ref 70–99)
Glucose-Capillary: 417 mg/dL — ABNORMAL HIGH (ref 70–99)
Glucose-Capillary: 422 mg/dL — ABNORMAL HIGH (ref 70–99)
Glucose-Capillary: 443 mg/dL — ABNORMAL HIGH (ref 70–99)
Glucose-Capillary: 463 mg/dL — ABNORMAL HIGH (ref 70–99)
Glucose-Capillary: 493 mg/dL — ABNORMAL HIGH (ref 70–99)

## 2011-02-03 LAB — GLUCOSE, CAPILLARY
Glucose-Capillary: 260 mg/dL — ABNORMAL HIGH (ref 70–99)
Glucose-Capillary: 280 mg/dL — ABNORMAL HIGH (ref 70–99)
Glucose-Capillary: 285 mg/dL — ABNORMAL HIGH (ref 70–99)
Glucose-Capillary: 294 mg/dL — ABNORMAL HIGH (ref 70–99)
Glucose-Capillary: 294 mg/dL — ABNORMAL HIGH (ref 70–99)
Glucose-Capillary: 305 mg/dL — ABNORMAL HIGH (ref 70–99)
Glucose-Capillary: 306 mg/dL — ABNORMAL HIGH (ref 70–99)
Glucose-Capillary: 316 mg/dL — ABNORMAL HIGH (ref 70–99)
Glucose-Capillary: 328 mg/dL — ABNORMAL HIGH (ref 70–99)
Glucose-Capillary: 328 mg/dL — ABNORMAL HIGH (ref 70–99)
Glucose-Capillary: 331 mg/dL — ABNORMAL HIGH (ref 70–99)
Glucose-Capillary: 338 mg/dL — ABNORMAL HIGH (ref 70–99)
Glucose-Capillary: 342 mg/dL — ABNORMAL HIGH (ref 70–99)
Glucose-Capillary: 344 mg/dL — ABNORMAL HIGH (ref 70–99)
Glucose-Capillary: 353 mg/dL — ABNORMAL HIGH (ref 70–99)
Glucose-Capillary: 359 mg/dL — ABNORMAL HIGH (ref 70–99)
Glucose-Capillary: 362 mg/dL — ABNORMAL HIGH (ref 70–99)
Glucose-Capillary: 363 mg/dL — ABNORMAL HIGH (ref 70–99)
Glucose-Capillary: 363 mg/dL — ABNORMAL HIGH (ref 70–99)
Glucose-Capillary: 366 mg/dL — ABNORMAL HIGH (ref 70–99)
Glucose-Capillary: 368 mg/dL — ABNORMAL HIGH (ref 70–99)
Glucose-Capillary: 371 mg/dL — ABNORMAL HIGH (ref 70–99)
Glucose-Capillary: 376 mg/dL — ABNORMAL HIGH (ref 70–99)
Glucose-Capillary: 383 mg/dL — ABNORMAL HIGH (ref 70–99)
Glucose-Capillary: 386 mg/dL — ABNORMAL HIGH (ref 70–99)
Glucose-Capillary: 391 mg/dL — ABNORMAL HIGH (ref 70–99)
Glucose-Capillary: 412 mg/dL — ABNORMAL HIGH (ref 70–99)

## 2011-02-05 LAB — BASIC METABOLIC PANEL
Calcium: 9.5 mg/dL (ref 8.4–10.5)
Chloride: 108 mEq/L (ref 96–112)
Creatinine, Ser: 1.39 mg/dL — ABNORMAL HIGH (ref 0.4–1.2)
GFR calc Af Amer: 45 mL/min — ABNORMAL LOW (ref >60)
Sodium: 141 mEq/L (ref 135–145)

## 2011-02-05 LAB — CBC
MCHC: 31.3 g/dL (ref 30.0–36.0)
MCV: 77.3 fL — ABNORMAL LOW (ref 78.0–100.0)
Platelets: 187 10*3/uL (ref 150–400)
WBC: 5.4 10*3/uL (ref 4.0–10.5)

## 2011-02-05 LAB — GLUCOSE, CAPILLARY: Glucose-Capillary: 168 mg/dL — ABNORMAL HIGH (ref 70–99)

## 2011-02-05 LAB — MRSA PCR SCREENING: MRSA by PCR: NEGATIVE

## 2011-02-20 LAB — GLUCOSE, CAPILLARY
Glucose-Capillary: 258 mg/dL — ABNORMAL HIGH (ref 70–99)
Glucose-Capillary: 265 mg/dL — ABNORMAL HIGH (ref 70–99)
Glucose-Capillary: 272 mg/dL — ABNORMAL HIGH (ref 70–99)
Glucose-Capillary: 377 mg/dL — ABNORMAL HIGH (ref 70–99)
Glucose-Capillary: 96 mg/dL (ref 70–99)

## 2011-02-20 LAB — BASIC METABOLIC PANEL
BUN: 14 mg/dL (ref 6–23)
BUN: 16 mg/dL (ref 6–23)
BUN: 18 mg/dL (ref 6–23)
CO2: 28 mEq/L (ref 19–32)
Calcium: 9.2 mg/dL (ref 8.4–10.5)
Creatinine, Ser: 1.32 mg/dL — ABNORMAL HIGH (ref 0.4–1.2)
Creatinine, Ser: 1.33 mg/dL — ABNORMAL HIGH (ref 0.4–1.2)
Creatinine, Ser: 1.49 mg/dL — ABNORMAL HIGH (ref 0.4–1.2)
GFR calc Af Amer: 48 mL/min — ABNORMAL LOW (ref >60)
GFR calc non Af Amer: 34 mL/min — ABNORMAL LOW (ref >60)
GFR calc non Af Amer: 40 mL/min — ABNORMAL LOW (ref >60)

## 2011-02-20 LAB — CBC
MCHC: 31.9 g/dL (ref 30.0–36.0)
MCV: 77.1 fL — ABNORMAL LOW (ref 78.0–100.0)
Platelets: 170 10*3/uL (ref 150–400)
Platelets: 176 10*3/uL (ref 150–400)
RBC: 4.66 MIL/uL (ref 3.87–5.11)
WBC: 6.4 10*3/uL (ref 4.0–10.5)

## 2011-03-01 ENCOUNTER — Other Ambulatory Visit (HOSPITAL_COMMUNITY): Payer: Self-pay | Admitting: Pulmonary Disease

## 2011-03-01 ENCOUNTER — Ambulatory Visit (HOSPITAL_COMMUNITY)
Admission: RE | Admit: 2011-03-01 | Discharge: 2011-03-01 | Disposition: A | Discharge status: Home / Self Care | Payer: Medicare Other | Source: Ambulatory Visit | Attending: Pulmonary Disease | Admitting: Pulmonary Disease

## 2011-03-01 DIAGNOSIS — R0602 Shortness of breath: Secondary | ICD-10-CM

## 2011-03-06 LAB — BLOOD GAS, ARTERIAL
Acid-Base Excess: 3.7 mmol/L — ABNORMAL HIGH (ref 0.0–2.0)
Bicarbonate: 28.3 mEq/L — ABNORMAL HIGH (ref 20.0–24.0)
O2 Saturation: 96.7 %
Patient temperature: 37
TCO2: 25.5 mmol/L (ref 0–100)

## 2011-04-03 NOTE — Discharge Summary (Signed)
Krystal [name], Krystal [name]             ACCOUNT NO.:  [id number]   MEDICAL RECORD NO.:  [id number]          PATIENT TYPE:  INP   LOCATION:  3701                         FACILITY:  [facility]   PHYSICIAN:  Dani Gobble, MD       DATE OF BIRTH:  06/25/1936   DATE OF ADMISSION:  03/05/2008  DATE OF DISCHARGE:  03/14/2008                               DISCHARGE SUMMARY   DISCHARGE DIAGNOSES:  1. Weakness, shortness of breath secondary to diastolic dysfunction      and obstructive sleep apnea, improved.  2. Moderate pulmonary hypertension.  3. Coronary artery disease with patent stents in the right coronary,      left circumflex and left anterior descending artery, though      residual disease of 80% in a small diagonal one treated medically.      Abnormal CK-MB but negative troponin during hospitalization.  4. Junctional rhythm with shortness of breath, have adjusted      medications.  5. Hypotension with episode of junctional rhythm and shortness of      breath, adjusted medications.  6. Diabetes mellitus 2 with medication adjustment, to follow up with      Dr. Dagoberto Ligas.  7. Renal insufficiency.  8. Anemia with heme-positive stools at discharge, will follow up as an      outpatient.  Colonoscopy was done 2 years ago.  9. Elevated D-dimer with negative V/Q scan and negative venous      Dopplers of lower extremities.  10.Obstructive sleep apnea with continuous positive airway pressure.  11.Dyslipidemia.   DISCHARGE CONDITION:  Improved.   PROCEDURES:  Combined left heart catheterization and right and left  heart catheterization by Dr. Yates Decamp, March 08, 2008.   DISCHARGE MEDICATIONS:  1. Plavix 75 mg daily.  2. Divan 320/12.5 one daily, which is new.  3. Imdur 30 mg daily.  4. Crestor 20 mg daily.  5. Verapamil 180 mg one daily, new dose.  6. Glipizide twice a day - she was unsure of the dosage.  7. Januvia 100 mg daily.  8. Zetia 10 mg daily.  9. Metoprolol tartrate 50 mg twice a  day.  10.Niacin SR 500 mg at bedtime, new.  11.Aspirin 81 mg two daily.  12.Omeprazole 20 mg daily.  13.Nitroglycerin 1/150 under your tongue as needed for chest pain.  14.You may use MiraLax over-the-counter as needed for constipation.  15.You can use your sliding scale insulin as before and she uses it if      her glucose is greater than 200.  16.Stop taking metformin, Actos, Diovan 160/12.5, use newer      medication, and stop gemfibrozil.   DISCHARGE INSTRUCTIONS:  1. Low-sodium, 2-gram sodium, heart-healthy diabetic diet.  2. Wash catheterization site with soap and water.  Call if any      bleeding, swelling or drainage.  3. Increase activity slowly.  4. Decrease food intake to lose weight.  5. Follow up with Dr. Domingo Sep at this week; the office will call with      date and time.  6. Follow up with Dr. Juanetta Gosling in [place] for a  CPAP titration at      [facility].  7. Follow up with Dr. Dagoberto Ligas within the next month for diabetes      management.  8. Follow up with Dr. Loleta Chance and/or Dr. Erby Pian for general medical      care.  9. Have lab work done through home health on Tuesday to recheck      hemoglobin.   HISTORY OF PRESENT ILLNESS:  The patient presented to the emergency room  March 05, 2008, with increasing edema.  She had a few sharp, shooting  chest pains but not consistent.  She had been started on Lasix 2 weeks  prior with some improvement but symptoms continued to progress.  The  patient did not bring her medicines to the emergency room and so there  was concern correct dosing, if she has gotten everything.  She was  admitted for rule out MI, further evaluation, and she was started on IV  Lasix.   ALLERGIES:  1. PENICILLIN.  2. MORPHINE.  3. ZOCOR.   PAST MEDICAL HISTORY:  1. Coronary disease with previous MI and stents to the RCA, LAD and      circumflex.  2. Diabetes mellitus 2.  3. Dyslipidemia.  4. Hypertension.  5. Obstructive sleep apnea.  6. Normal  EF with diastolic dysfunction.  7. Mild to moderate MR, mild TR.  8. Pulmonary hypertension.  9. She had bilateral knee arthritis as well.  10.She previously has had a CardioNet for palpitations but no      arrhythmias were noted.  11.She has peripheral arterial disease with mild left subclavian      disease and disease of the proximal ICA.  12.She also has degenerative joint disease and spinal stenosis.   FAMILY HISTORY, SOCIAL HISTORY, REVIEW OF SYSTEMS, AND OUTPATIENT  MEDICATIONS:  See H&P.   PHYSICAL EXAM AT DISCHARGE:  Blood pressure 117/42, pulse 65 and  regular, temperature 97.8.  HEART:  Regular rate and rhythm.  No gallop, no murmur.  LUNGS:  Clear.  ABDOMEN:  Obese, soft, nontender.   LABORATORY DATA:  Admitting hemoglobin 10.1, hematocrit 32.5, WBC 6.9,  platelets 264, neutrophils 55, lymph 31.  Her hemoglobin slowly drifted  down.  She had been on heparin.  Prior to discharge her hemoglobin is  9.5 with hematocrit of 29.5, WBC 7.1, and platelets 229.   Blood gases on room air done April 21 showed pH of 7.405, pCO2 44.8, and  pO2 of 71.7 with oxygen saturation of 93%.  Coags on admission:  Pro  time 140, INR 1.1.  She was placed on heparin.  D-dimer was elevated at  0.50.  Tests were done, no clots were found.   Electrolytes prior to discharge:  Sodium 141, potassium 4.1, chloride  104, CO2 30, glucose 135, BUN 41, creatinine 1.24.  Creatinine peaked at  1.73 prior to the catheterization.  With the fluids it came down.  LFTs  were normal.  Total bili was low at 0.2.  Magnesium level was 2.3.   Cardiac markers:  CK ranged 261, 232, 214.  MBs 13, 9.5 and 6.8.  Relative index was elevated but troponin I was all negative at 0.01 x3.  BNP on admission was 172, came down to 150.   Total cholesterol 103, triglycerides 102, HDL 25, LDL 58.   TSH was 4.065.   Iron initially was 107 and prior to discharge was 56, TIBC was 492,  percent saturation was 22, and UIBC was  385.  Stool was heme-positive prior to discharge.   Radiology:  Chest x-ray on admission:  Heart size and mediastinal  contours within normal limits.  Lungs were clear.  No active disease.  V/Q scan done April 22:  Low likelihood ratio for pulmonary embolus.  Follow-up chest x-ray on April 22:  Heart size was stable.  Lungs were  clear.  No pleural fluid.  No acute findings.   EKG on admission:  Sinus rhythm, first-degree AV block, PR was 229  milliseconds, old septal infarct, no acute changes.   HOSPITAL COURSE:  Krystal [name] was admitted after presenting with  increasing shortness of breath and edema.  Her chest x-ray was clear.  She was started on IV Lasix for her diastolic dysfunction though blood  pressure on admission was actually 107/44, though at times with activity  it would be higher.  She was started on heparin and followed.  Plans  were for cardiac catheterization on April 20.  She was given Mucomyst  prior to the cardiac catheterization to protect her kidneys as she does  have renal insufficiency.  Underwent cardiac catheterization with  results as previously described and was found to have, again, moderate  pulmonary hypertension which had been seen on her 2-D echocardiogram  previously.  Her D-dimer was elevated.  She underwent a V/Q scan that  was negative for PE.  She also had lower extremity venous Dopplers,  again negative for DVT, and also no Baker cyst.  She does have  obstructive sleep apnea and was maintained on CPAP during  hospitalization.  Pulmonary consult was obtained.  Initially, there was  consideration for home oxygen but using the CPAP at night, during the  day her oxygen level actually improved and stayed in the 90s.  On the  last consult note from Dr. Delton Coombes that she would need CPAP titration  study at [facility], no oxygen for home, follow up with Dr. Juanetta Gosling.  If  she needed further pulmonary hypertension, follow up with Dr. Delton Coombes, he  would be glad  to see her.  The patient continued to improve.  She was  slightly anemic now.   On March 13, 2008, she did develop increasing shortness of breath  resting in bed.  She was found to be in a junctional rhythm.  Her blood  pressure had dropped to 92 and her heart rate was 48 in a junctional  rhythm.  We held her Lasix from that time out, it had already been  switched to p.o., and we also held her Diovan and verapamil and beta  blocker.  By the next morning she was better, though medications were  held, the Lopressor and the verapamil as needed.   At discharge I have decreased her verapamil because of the junctional  rhythm.  I will have her follow up with Dr. Domingo Sep to ensure that her  blood pressure does not climb at home once she goes back to eating her  more regular foods.  She has been instructed on 2-gram sodium diet and  she also will follow up with Dr. Dagoberto Ligas for diabetes.   Also, the patient seemed somewhat confused with medications.  We have  made changes.  She has different bottles of Verelan and verapamil at  home, partly because her pharmacy gives her one or the other depending  on what they have, which has not very helpful for this patient.  Home  health is to go out and assist her with her changes and get  her on the  right track.  Also, she will need lab work done Tuesday, April 28 to  follow up her hemoglobin.  I have not yet put her on iron; I was waiting  to see if off heparin and Lovenox her H&H would return back to her  previous hemoglobin of 10.      Darcella Gasman. Annie Paras, N.P.    ______________________________  Dani Gobble, MD    LRI/MEDQ  D:  03/14/2008  T:  03/14/2008  Job:  [id number]   cc:   Annia Friendly. Loleta Chance, MD  Oneal Deputy Juanetta Gosling, M.D.  Franchot Heidelberg, M.D.  Critical Care Medicine  Alfonse Alpers. Dagoberto Ligas, M.D.

## 2011-04-03 NOTE — Discharge Summary (Signed)
NAMEJANUS, VLCEK             ACCOUNT NO.:  [id number]   MEDICAL RECORD NO.:  [id number]          PATIENT TYPE:  OBV   LOCATION:  A219                          FACILITY:  [facility]   PHYSICIAN:  Marcello Moores, MD   DATE OF BIRTH:  Dec 04, 1935   DATE OF ADMISSION:  02/08/2007  DATE OF DISCHARGE:  03/26/2008LH                               DISCHARGE SUMMARY   ASSESSMENT THROUGH THE TRANSFER:  1. Severe back pain secondary to disk herniation with possible      compression.  2. Hypertension fairly controlled.  3. Hyperlipidemia stable.  4. Coronary artery disease stable.  5. Diabetes mellitus fairly controlled.  6. Chronic obstructive pulmonary disease.  7. Bronchitis stable.  8. History of anemia   HOSPITAL COURSE:  Ms. Robillard is a 75 year old female patient with  multiple medical problems.  She presented with severe back pain for two  weeks and with tingling and numbness of the lower legs.  A CAT scan of  the spine was done which shows disk herniation with possible compression  and she was put on pain medications, but then numbness and tingling  persisted.  It was discussed with neurosurgeon, Dr. Franky Macho, and he  agreed to accept her under his service and was transferred to [facility] for further treatment on February 12, 2007.   CURRENT MEDICATIONS:  1. Plavix 75 mg p.o. daily.  2. Diovan/hydrochlorothiazide 160/12.5 mg p.o. daily.  3. Actos 45 mg p.o. daily.  4. __________  600 mg p.o. daily.  5. Isosorbide mononitrate 30 mg p.o. daily.  6. Crestor 20 mg p.o. daily.  7. Verelan 300 mg p.o. daily.  8. Glipizide 10 mg p.o. daily.  9. Metformin 500 mg p.o. b.i.d.  10.Januvia 100 mg p.o. daily.   PHYSICAL EXAMINATION:  VITAL SIGNS:  Blood pressure 120/53, temperature  97.8, pulse rate 68 per minute.  HEENT:  She has pink conjunctivae and nonicteric sclera.  CHEST:  She has good air entry resolved with rhonchi.  CVS:  S1, S2 was regular.  ABDOMEN:  Soft but  obese.  EXTREMITIES:  She has no edema.  CNS:  She is alert and well-oriented.  There was no neurological deficit  except she has persistent numbness and tingling on the lower  extremities.   After discussing with Dr. Franky Macho of neurosurgery, she was transferred  to [facility] for further possible management to relieve her disk  herniation and compression.      Marcello Moores, MD  Electronically Signed     MT/MEDQ  D:  04/07/2007  T:  04/07/2007  Job:  [id number]

## 2011-04-03 NOTE — Procedures (Signed)
Krystal [name], SCARPULLA             ACCOUNT NO.:  [id number]   MEDICAL RECORD NO.:  [id number]          PATIENT TYPE:  OUT   LOCATION:  SLEEP LAB                     FACILITY:  [facility]   PHYSICIAN:  Barbaraann Share, MD,FCCPDATE OF BIRTH:  28-Sep-1936   DATE OF STUDY:  04/26/2008                            NOCTURNAL POLYSOMNOGRAM   REFERRING PHYSICIAN:  Dani Gobble, MD   INDICATIONS FOR STUDY:  Hypersomnia with sleep apnea.  The patient needs  optimization of CPAP pressure.   EPWORTH SCORE:  7.   SLEEP ARCHITECTURE:  The patient had total sleep time of 348 minutes  with decreased slow wave sleep as well as REM.  Sleep onset latency was  at the upper range of normal with 31 minutes noted, and REM onset was  normal as well.  Sleep efficiency was decreased at 86%.   RESPIRATORY DATA:  The patient underwent formal CPAP titration with a  medium Respironics ComfortGel mask.  Pressure was increased  incrementally up to a pressure of 12 cm of water, however, there were  significant numbers of breakthrough events noted.  Because of this, the  patient's pressure was increased to 13 cm, however, was decreased back  to 12 due to oral venting.  The sleep technician the decided against  replacing her nasal mask with a full face mask or trying a chin strap.  The patient's optimal pressure will be 13 cm, and the patient will  require a full face mask.  There was moderate snoring noted until CPAP  pressure was optimized.   OXYGEN DATA:  There was transient O2 desaturation as low as 86% until  optimal CPAP pressure was reached.   CARDIAC DATA:  No clinically significant arrhythmias were noted.   MOVEMENT/PARASOMNIA:  There were no abnormal leg jerks or abnormal  behaviors noted.   IMPRESSION/RECOMMENDATIONS:  1. Moderate control of previously documented obstructive sleep apnea      with 12 cm of CPAP pressure delivered by a medium Respironics      ComfortGel mask.  The patient had significant  breakthrough events      at that pressure, however on a pressure of 13 cm there was      excessive oral venting.  I would recommend a treatment pressure at      13 cm with a full face CPAP mask.  The patient should also be      encouraged to work aggressively on weight loss.      Barbaraann Share, MD,FCCP  Diplomate, American Board of Sleep  Medicine  Electronically Signed     KMC/MEDQ  D:  05/11/2008 15:22:42  T:  05/11/2008 16:04:41  Job:  [id number]

## 2011-04-03 NOTE — Procedures (Signed)
NAMESAYLOR, MURRY             ACCOUNT NO.:  [id number]   MEDICAL RECORD NO.:  [id number]           PATIENT TYPE:  OUT   LOCATION:  RESP                          FACILITY:  [facility]   PHYSICIAN:  Edward L. Juanetta Gosling, M.D.DATE OF BIRTH:  01-11-1936   DATE OF PROCEDURE:  DATE OF DISCHARGE:                            PULMONARY FUNCTION TEST   1. Spirometry shows a moderate ventilatory defect with evidence of      airflow obstruction which is most marked in the smaller airways.      2.  Lung volumes show borderline reduction in total lung capacity      and borderline air trapping.  2. DLCO is severely reduced.  3. Arterial blood gas shows adequate oxygenation, but note that it is      on 28% O2 and pCO2 is mildly elevated suggesting chronic CO2      retention.  4. There is no significant bronchodilator effect.      Edward L. Juanetta Gosling, M.D.  Electronically Signed     ELH/MEDQ  D:  12/22/2008  T:  12/22/2008  Job:  [id number]

## 2011-04-03 NOTE — Procedures (Signed)
NAMEALMAROSA, BOHAC             ACCOUNT NO.:  [id number]   MEDICAL RECORD NO.:  [id number]          PATIENT TYPE:  OUT   LOCATION:  RAD                           FACILITY:  [facility]   PHYSICIAN:  Edward L. Juanetta Gosling, M.D.DATE OF BIRTH:  1936-10-09   DATE OF PROCEDURE:  DATE OF DISCHARGE:                            PULMONARY FUNCTION TEST   1. Spirometry shows a moderate ventilatory defect without definite      airflow obstruction except at the level of the smaller airways.  2. Lung volumes are mildly reduced suggestive of a restrictive change.  3. DLCO is moderately reduced.  4. Arterial blood gases are normal.  5. There is no significant bronchodilator effect.  6. Noting the patient's stated height and weight, some of the      abnormalities seen on pulmonary function testing may be related to      body habitus.      Edward L. Juanetta Gosling, M.D.  Electronically Signed     ELH/MEDQ  D:  12/04/2007  T:  12/04/2007  Job:  [id number]   cc:   Franchot Heidelberg, M.D.

## 2011-04-03 NOTE — Cardiovascular Report (Signed)
NAMEFLOSSIE, WEXLER             ACCOUNT NO.:  [id number]   MEDICAL RECORD NO.:  [id number]          PATIENT TYPE:  INP   LOCATION:  3701                         FACILITY:  [facility]   PHYSICIAN:  Cristy Hilts. Jacinto Halim, MD       DATE OF BIRTH:  1936/03/11   DATE OF PROCEDURE:  03/08/2008  DATE OF DISCHARGE:                            CARDIAC CATHETERIZATION   PROCEDURES PERFORMED:  1. Hemodynamic monitoring of the left ventricle.  2. Coronary arteriography including selective right and left coronary      arteriography.  3. Right heart catheterization and calculation of cardiac output and      cardiac index by Fick.   INDICATION:  Ms. Aydia Maj is a 75 year old female with history of  known coronary artery disease, hypertension, hyperlipidemia, and  obstructive sleep apnea, who was admitted to the hospital with  increasing shortness of breath.  She was ruled out for myocardial  infarction.  She has history of PTCA and stenting to her mid circumflex  coronary artery in 1999 in [place] and mid and proximal LAD stent that  is 3.0 x 23 mm Cypher stent placed in January 2008, and a mid RCA stent  3.0 x 18 mm Cypher placed on December 20, 2005, and a distal RCA stent  that is a mini Vision placed on December 20, 2005.  A right heart  catheterization is being performed to evaluate for pulmonary  hypertension.   HEMODYNAMIC DATA RIGHT HEART CATHETERIZATION:  RA pressure 16/15 with a  mean of 30 mmHg.  Saturation 58%.   RV pressure 50/8, end-diastolic pressure of 14 mmHg.   PA pressure 49/19 with a mean of 30 mmHg.  PA saturation 55%.   Pulmonary capillary wedge 17/16, mean 15 mmHg.  Aortic saturation was  89%.   Cardiac output was 6.28, cardiac index of 2.98 by Fick and 5.09 and 2.41  by thermodilution respectively.   HEMODYNAMIC DATA OF LEFT HEART CATHETERIZATION:  Left ventricular  pressure was 138/-7 with end-diastolic pressure of 25 mmHg.  Aortic  pressure was 132/49 with a mean of 76  milliseconds mmHg.  There is no  pressure gradient across the aortic valve.   ANGIOGRAPHIC DATA:  Left ventricle:  Left ventriculography was not  performed, but the hemodynamic monitoring was performed only.  Previously, she has known normal left ventricular function.   Right coronary artery:  The right coronary artery has a largecaliber  dominant vessel.  The previously placed stents in the mid RCA and distal  RCA are widely patent.  There is mild luminal irregularity. The proximal  right coronary artery had mild calcification.   Left main coronary artery:  Left main coronary artery has a large  caliber smooth and normal.   Circumflex:  Circumflex artery is a large caliber vessel.  The proximal  circumflex has 20% stenosis and has an acute bend.  The mid circumflex  stent is widely patent.  The circumflex gives origin to a moderate-sized  obtuse marginal one, which is widely patent with mild luminal  irregularity.   Left LAD:  LAD has a large caliber vessel.  The previously placed Cypher  stent in the proximal and mid LAD is widely patent.  The stent jailed  diagonal one shows a 80% ostial stenosis; however, this is a 1.0 to 1.5  mm vessel and this is unchanged from prior cardiac catheterization.  The  LAD gives origin to large diagonal 2.  The LAD itself has mild luminal  irregularity.  It ends at the apex.   IMPRESSION:  1. Widely patent previously placed stents.  Elevated left ventricle      end-diastolic pressure secondary to hypertension, hyperlipidemia,      and obesity.  2. Moderate pulmonary hypertension.  Shortness of breath is probably      related to her obesity, diastolic dysfunction, and moderate      pulmonary hypertension.   RECOMMENDATIONS:  She will need weight loss and continued exercise  program and aggressive control of hypertension.  I did very minimal,  less than 1 mL contrast injection of the renal arteries and the renal  arteries were widely patent.    A total of 30-35 mL of contrast was utilized for diagnostic angiography  given her renal insufficiency.   TECHNIQUES AND PROCEDURES:  Under usual sterile precautions using a 7-  [place] right femoral venous and a 6-French right femoral arterial  access, a balloon-tip Swan-Ganz catheter was advanced to the venous  access into the pulmonary capillary wedge position.  The right-sided  hemodynamics were carefully performed and analyzed, waveforms were  analyzed and the catheter was then pulled out of the body.   A 6-French multipurpose B2 catheter was advanced to the ascending aorta  and then the left ventricle, and left ventricular pressures were  monitored.  Then, the catheter was pulled in the ascending aorta and  left main coronary artery selective engaged and angiography was  performed.  The catheter then pulled back in the abdominal aorta and  selective right and left renal arteriography by hand contrast injection  was performed with very minimal contrast, but this was not cine  angiogramed. The catheter then pulled out of the body and a 6-French  Judkins right 4 diagnostic catheter was utilized and gave the right  coronary artery in the usual fashion over a J-wire and angiography was  performed.  Then, the catheter was pulled out of the body.  Right  femoral arteriography was performed.  It was decided to hold to hold  manual pressure, as she already has a venous sheath.  The patient  tolerated the procedure well.  No immediate complications noted.      Cristy Hilts. Jacinto Halim, MD  Electronically Signed     JRG/MEDQ  D:  03/08/2008  T:  03/09/2008  Job:  [id number]   cc:   Ramon Dredge L. Juanetta Gosling, M.D.  Franchot Heidelberg, M.D.

## 2011-04-06 NOTE — Cardiovascular Report (Signed)
NAMEWAVER, DIBIASIO             ACCOUNT NO.:  [id number]   MEDICAL RECORD NO.:  [id number]          PATIENT TYPE:  OIB   LOCATION:  2899                         FACILITY:  [facility]   PHYSICIAN:  Darlin Priestly, MD  DATE OF BIRTH:  1936/06/07   DATE OF PROCEDURE:  12/20/2005  DATE OF DISCHARGE:                              CARDIAC CATHETERIZATION   PROCEDURES:  1.  Coronary angiography.  2.  PDA - proximal.  3.  Percutaneous transluminal coronary  angioplasty.  4.  Placement of intracoronary stent.  5.  RCA - mid.  6.  Placement of intercoronary stent.   COMPLICATIONS:  None.   Ms. Bellucci is a 75 year old female patient of Dr. Mirna Mires, Dr. Faythe Dingwall with a history of diabetes, hypertension, hyperlipidemia, history  of CAD with MI in 1995 with subsequent PTCA stenting of the distal RCA.  She  has also had history of in-stent restenosis of the distal RCA as well as a  cutting balloon angioplasty of the PDA and distal RCA.  In addition she has  stents to obtuse marginals.  For this reason she is complaining of  increasing shortness of breath and a repeat catheterization by me on December 14, 2005 revealing 80% mid RCA to these with 80% PDA lesion.  Her EF was  normal at the time.  She was also noted to have a small diagonal lesion.  She is now brought for a percutaneous intervention of the PDA and RCA.   DESCRIPTION OF PROCEDURE:  After informed consent the patient was brought to  the cardiac lab, right and left groin shaved, prepped and draped in usual  sterile fashion.  ECG monitor was established.  Using modified Seldinger  technique, a #6-French arterial sheath was inserted  in right femoral  artery. A 6-French JL-4 guiding catheter with side holes was engaged in the  right coronary ostium .  Next, a .014 ATW marker wire was advanced out of  the guiding catheter and positioned in the distal PDA without difficulty.  Following this a Voyager 2.0 x 8.0 mm balloon  was then positioned  across  the proximal PDA stenotic lesion and 2 inflations to 14 atmospheres  was  performed for approximately 1 minute and 40 seconds.  Follow-up angiogram  revealed no evidence of dissection or thrombus.  This  balloon was then  removed and the Guidant multi link mini Vision stent 2.25 x 12.0 mm was then  positioned across the proximal PDA stenotic  occlusion.  This  stent was  then deployed to 8 atmospheres for a total of 29 seconds.  A second  inflation to 12 atmospheres was performed for 21 seconds.  Follow-up  angiogram revealed no history of dissection or thrombus with TIMI-3 flow.  This stent balloon was then removed.  A Cypher 3.0 x 18.0 stent was then  positioned across the mid RC stenotic occlusion.  This stent was then  deployed to 12 atmospheres for a total of 29 seconds.  This stent appeared  to be appropriately sized in its proximal portion, however it appeared to be  somewhat  under deployed in the mid portion.  We then took a power cell 3.0 x  32 mm balloon and positioned it  in the mid stent to 14 atmospheres for a  total of 37 seconds.  Follow up angiogram showed no evidence of dissection  or thrombus.  The patient did complain of increasing shortness of breath  during the procedure and began to wheeze.  She was given intravenous Lasix  as well as a breathing treatment and maintained saturations in the upper  90's.  She was also noted to be markedly hypertensive requiring 18 cc of  nitro.  IV Angiomax was used throughout the case.   At this point we elected to conclude the procedure.  All balloons, wires,  and caths  were removed.  Hemostatic sheaths were sewn in place and patient  sent back to the recovery room in stable condition.   CONCLUSIONS:  1.  Successful percutaneous coronary angioplasty and placement of a multi      link mini-vision 2.25 x 12.0 mm stent in the proximal  PDA stenotic      lesion.  2.  Successful placement of a Cypher 3.0 x  18.0 mm stent in the mid RCA      stenotic lesion.  3.  Adjunct use of Angiomax infusion.  4.  Systemic hypertension.      Darlin Priestly, MD  Electronically Signed     RHM/MEDQ  D:  12/20/2005  T:  12/20/2005  Job:  [id number]   cc:   Annia Friendly. Loleta Chance, MD  Fax: [phone]   Faythe Dingwall, M.D.

## 2011-04-06 NOTE — Cardiovascular Report (Signed)
Krystal [name], Krystal [name]             ACCOUNT NO.:  [id number]   MEDICAL RECORD NO.:  [id number]          PATIENT TYPE:  INP   LOCATION:  2807                         FACILITY:  [facility]   PHYSICIAN:  Darlin Priestly, MD  DATE OF BIRTH:  1936/01/13   DATE OF PROCEDURE:  11/21/2006  DATE OF DISCHARGE:                            CARDIAC CATHETERIZATION   PROCEDURES:  1. Left heart catheterization.  2. Coronary angiography.  3. Left ventriculogram.  4. LAD - proximal - placement of intracoronary stent.   COMPLICATIONS:  None.   INDICATION:  Ms. Kirstein is a 75 year old female patient of Dr. Kem Boroughs and Dr. Dagoberto Ligas with a history of diabetes, hypertension,  hyperlipidemia, obstructive sleep apnea, history of inferior wall MI in  1995 with subsequent of stent in the RCA.  She has had multiple  percutaneous interventions with the RCA and PDA since that time with  scattered 60% disease throughout her LAD system.  Recently, she has  complained of increasing shortness of breath and chest pain.  She is now  referred for a repeat catheterization to assess her coronary anatomy.   DESCRIPTION OF OPERATION:  After obtaining informed consent, the patient  was brought to the cardiac cath lab.  The right and left groin was  shaved, prepped and draped in the usual sterile fashion.  An ECG monitor  was established.  Using modified Seldinger technique, #6-French arterial  sheath in the right femoral artery.  A 6-French diagnostic catheter was  used to performed using diagnostic angiography.   The left main is a medium-to-large vessel with no significant disease.   The LAD is a large vessel which coursed through the apex and gives rise  to two diagonal branches.  The LAD is noted to have 80% stenosis in the  proximal segment extending across the first diagonal on the septal  perforator.  The remainder of the LAD is irregular but has no high-grade  stenosis.   The first diagonal is a small  vessel with 80% ostial and proximal  disease.   The second diagonal is a medium-sized vessel with no significant  disease.   The circumflex is a medium-sized vessel coursing to the AV groove and  gives rise to three obtuse marginal branches.  There is a stent noted in  the mid AV groove circumflex which appears to be widely patent with mild  irregularities in the distal AV groove.   The first OM is a medium-sized vessel with no significant disease.   The second OM is a small vessel with no significant disease.   The third OM is a medium-sized vessel with no significant disease.   The right coronary  artery is a large vessel which is dominant and gives  rise to both PDA and posterolateral branch.  The RCA is coarsely  irregular with stent noted in the mid portion and distal portion.  The  proximal portion of the RCA is noted to be diffused 30% to 40% disease.  The mid RCA stent appears to be widely patent.  There is a 50% lesion  beyond the stent.  The distal  stent has 40% diffuse in-stent restenosis.   The PDA is a medium-sized vessel with stent noted in its proximal  segment with no severe disease.   The PLA is a small vessel with 90% proximal disease.   The left ventricle reveals a supernormal EF at 80%.   Hemodynamic systemic arterial pressure 139/55, LV systemic pressure of  139/5, LVEDP of 18.   INTERVENTIONAL PROCEDURE:  LAD-Proximal:  Final diagnostic angiography,  a #6-French JL4 guiding catheter was selectively engaged in the left  coronary ostium.  Following this, a 0.014 ATW __________  guiding  catheter was positioned across the proximal LAD stenotic lesion.  Measurements were obtained.  The guidewire was then positioned in the  distal LAD without difficulty.  A Cypher 3.0 x 23-mm stent was then  positioned across the proximal LAD stenotic lesion.  The stent was then  employed to 10 atmospheres for a total of approximately 15 seconds.  A  second inflation to 14  atmospheres were performed for a total of  approximately 10 seconds.  Followup angiogram revealed excellent luminal  gain with no dissection of the thrombus.  There was a TIMI 1 to 2 flow  noted in the septal perforator which improved with intracoronary nitro.  We then took a Quantum Maverick 3.0 x 12-mm balloon into the distal  portion of the stent, and overlapping inflations were performed  throughout the distal, mid, and proximal portions of the stent up to 20  atmospheres for a total of approximately 25 seconds.  Followup angiogram  revealed no dissection of the thrombus and TIMI-3 flow into the distal  vessel.  IV Angiomax was used throughout the case.   Final orthogonal angiograms reveal less than 10% of the residual  stenosis in the proximal LAD stenotic lesion with TIMI 3 flow into the  distal LAD.  At this point, we elected to conclude the procedure.  All  balloons, wires, caths were removed.  Hemostatic sheaths were sewn in  place, and the patient returned back to the ward in stable condition.   CONCLUSIONS:  1. Successful placement of a Cypher 3.0 x 23-mm stent in the proximal      LAD stenotic lesion, ultimately post dilated to 3.18 mm.  2. Scattered noncritical disease of the right coronary artery with 90%      lesion of the proximal small posterolateral artery.  3. Super normal EF.  4. Systemic hypertension.  5. Adjuvant use of Angiomax infusion.      Darlin Priestly, MD  Electronically Signed     RHM/MEDQ  D:  11/21/2006  T:  11/21/2006  Job:  [id number]   cc:   Annia Friendly. Loleta Chance, MD  Alfonse Alpers Dagoberto Ligas, M.D.  Dani Gobble, MD

## 2011-04-06 NOTE — Discharge Summary (Signed)
NAMEKAYAH, HECKER             ACCOUNT NO.:  [id number]   MEDICAL RECORD NO.:  [id number]          PATIENT TYPE:  INP   LOCATION:  2914                         FACILITY:  [facility]   PHYSICIAN:  Darlin Priestly, MD  DATE OF BIRTH:  26-Jan-1936   DATE OF ADMISSION:  12/20/2005  DATE OF DISCHARGE:  12/22/2005                                 DISCHARGE SUMMARY   DISCHARGE DIAGNOSIS:  1.  Subendocardial myocardial infarction by enzymes, status post elective      right coronary artery Cypher stenting this admission.  2.  Known coronary disease with previous right coronary artery intervention      in 1995 and subsequent interventions and atherectomy for in-stent      restenosis.  3.  Residual moderate disease in the left anterior descending.  4.  Hypertension.  5.  Insulin-dependent diabetes.  6.  Mild renal insufficiency.  7.  Fibromuscular dysplasia of the carotids.  8.  Arthritis.   HOSPITAL COURSE:  The patient is a 75 year old female followed by Dr.  Domingo Sep and Dr. Loleta Chance with a history of coronary disease as noted above.  Previously, she has had cardiac arrest during a Cardiolite study; I believe  this was in [place].  She saw Dr. Domingo Sep in the office December 04, 2005.  She had been having chest pain and was worrisome for angina.  She was set up  for a diagnostic catheterization.  This done as an outpatient by Dr. Jenne Campus  on December 14, 2005, and revealed an 80% distal RCA with a patent RCA site  previous to that.  Proximal to the RCA stent, there was an 80% stenosis.  The left main and circumflex were normal.  The LAD had a 50% narrowing and  an 80% in a small diagonal. EF was hyperdynamic.  The patient was admitted  for elective intervention on December 20, 2005.  The proximal RCA was dilated  and stented with a mini-vision stent.  The distal lesion was stented with a  Cypher stent.  She was put on Angiomax and monitored in the unit.  She did  have a bump in her troponins,  but her CKs were negative.  We feel she can be  discharged December 22, 2005.  She has had a slight rise in her BUN  postprocedure and we held her Diovan/HCTZ, and she will continue to hold  this for the next couple of days.   LABORATORY DATA:  White count 7.7, hemoglobin 10.6, hematocrit 32.8,  platelets 319, 000.  Sodium 130, potassium 2.9, BUN 42, creatinine 1.4.  Liver functions were normal.  Troponins peaked at 1.19.  BNP was less than  30.  A 2-D echocardiogram revealed normal LV function with mild to moderate  LVH.  Chest x-ray showed coarse bronchovascular markings which were stable.  No acute disease.  INR is 1.0.  EKG shows sinus rhythm, septal Q-waves and  poor anterior R-wave progression.   DISPOSITION:  The patient is discharged in stable condition.   DISCHARGE MEDICATIONS:  1.  Coated aspirin once a day.  2.  Plavix 75 mg a day.  A  3.  Gemfibrozil 600 mg twice a day.  4.  Glucotrol XL 10 mg twice a day.  5.  Imdur 30 mg a day.  6.  Crestor 10 mg a day.  7.  Actos plus 15/850 once a day which she will start on Sunday.  8.  75/25 insulin h.s. as taken at home.  9.  Verelan PM.  10. She will hold her Diovan/HCTZ for three days.   DISCHARGE INSTRUCTIONS:  1.  She will have a BMP Monday in [place].  2.  She will see Dr. Domingo Sep in about a week in the office in [place].      Abelino Derrick, P.A.      Darlin Priestly, MD  Electronically Signed    LKK/MEDQ  D:  12/22/2005  T:  12/22/2005  Job:  [id number]   cc:   Dani Gobble, MD  Fax: [phone]   Annia Friendly. Loleta Chance, MD  Fax: [phone]

## 2011-04-06 NOTE — Consult Note (Signed)
NAMEORVILLE, WIDMANN             ACCOUNT NO.:  [id number]   MEDICAL RECORD NO.:  [id number]          PATIENT TYPE:  INP   LOCATION:  3018                         FACILITY:  [facility]   PHYSICIAN:  Coletta Memos, M.D.     DATE OF BIRTH:  10/30/1936   DATE OF CONSULTATION:  02/13/2007  DATE OF DISCHARGE:                                 CONSULTATION   CHIEF COMPLAINT:  Displaced disk left L5-S1.   INDICATIONS:  Ms. Mcconaughey is a 75 year old woman who was admitted to the  [facility] on February 08, 2007.  At  that time, she was admitted for back pain.  She said she actually had to  call an ambulance to take her to the hospital because she was in so much  pain.  She does live by herself and she had never experienced pain like  this before.  The back pain was in the lower back radiating into the  left leg.  She had no discomfort in the right side.  She had no bowel or  bladder dysfunction.  She was not able to alleviate the pain in any  meaningful way.  She was seen in the [facility] emergency room and then  admitted by Dr. Sherle Poe on the 22nd.  She had some strange sensations  in the left lower extremity.  She was having great difficulty walking  around the house.  She says she has had pain for the past year in a  waxing and waning fashion.  The pain, however, became much worse over  the last 2 weeks and was persistent.  She has not had pain like this in  the past.  She was admitted to the hospital for pain control and  evaluation of this.  While at [facility], she received an MRI which  showed a large disk herniation at L5-S1, on the date of transfer the  24th.  However, they were unable I assume to obtain good pain control,  so I was asked yesterday if I would take her on transfer to achieve  adequate pain control.  I was asked to take on the transfer and she is  transferred to [facility] for possible operative intervention.   PAST MEDICAL  HISTORY:  1. Significant for coronary artery disease.  2. Hypertension.  3. Hyperlipidemia.  4. Asthma.  5. Chronic obstructive pulmonary disease.  6. Type 2 diabetes.  7. Obstructive sleep apnea with the use of CPAP.  8. Peripheral arterial disease.  9. Chronic anemia.   MEDICATIONS:  Included Plavix, Diovan, Actos, gemfibrozil, isosorbide  dinitrate, Crestor, Verelan, glipizide, metoprolol, metformin, Januvia  100 mg p.o. daily and she also uses a nebulizer at home.  She has an  allergy to PENICILLIN.  She has an intolerance to MORPHINE.   She is single.  She had a child who died.  She does not currently use  tobacco but has in the past.  Does not abuse any drugs.  No history of  illicit drug use.   FAMILY HISTORY:  Positive for coronary artery disease, diabetes,  hypertension.  She says she also cannot tolerate Zocor and Dilaudid.  She had a tubal ligation in 1999.  She has arthritis in her shoulder.  She does have headaches at times.  Position changes cause her to be  quite uncomfortable.  She has undergone coronary artery bypass grafting,  she says.  She did have stent placement in January 2008 secondary to  coronary artery disease.   VITAL SIGNS:  97.8, 78, 18, 122/58, oxygen saturation of 96.   EXAMINATION:  She is alert, oriented x4 and answering all questions  appropriately.  Significant pain in the lower extremities with movement.  Positive straight leg raising on the left side about 30 degrees.  She  has some weakness in the dorsiflexors on the left foot.  Proprioception  is intact.  Pinprick is intact.  Reflexes are intact at the knees and  ankles.  Normal muscle tone, bulk and coordination in the upper and  lower extremities.  I did not try to have Mrs. Geng walk as she was  in a good deal of discomfort.  She does have IVs placed.  She has  audible wheezing in the lungs.  No cervical bruits are appreciated.  Pulses are good in the wrists.  Abdomen is soft,  nontender.  Bowel  sounds were present.  No clubbing, cyanosis or edema in the upper and  lower extremities.   MRI shows a displaced disk at L5-S1 which has migrated rostrally behind  the body of L5.  This is putting a good deal of pressure on the L5 nerve  root.  The conus and the cauda are normal.  She has degenerated disk at  L1-2, L2-3, L3-4, L4-5, L5-S1.  Some facette arthropathy but very minor.  The biggest problem is the disk herniation at L5-S1 again which puts  more pressure seemingly on the L5 root then any other on the left side.   DIAGNOSIS:  1. Displaced disk left lumbar verterbrae-5/sacral vertebrae-1.  2. Left sacral vertebral-1 radiculopathy.   Ms. Srinivasan is amenable to surgery tonight.  However, she is on Plavix  that was not stopped.  I will order a bleeding time to see if this  improves to see exactly what her situation is, but if the bleeding time  is too high, I do not think for the sake of the disk operation that  platelet transfusion is in order.  We will simply wait until her  platelet function has returned closer to normal and then proceed with an  operative procedure then.  I will keep her in the hospital as she lives  by herself, does have good deal of weakness in the left lower extremity  as a result of the disk herniation.           ______________________________  Coletta Memos, M.D.     KC/MEDQ  D:  02/13/2007  T:  02/13/2007  Job:  [id number]

## 2011-04-06 NOTE — Discharge Summary (Signed)
NAMESHELDA, TRUBY             ACCOUNT NO.:  [id number]   MEDICAL RECORD NO.:  [id number]          PATIENT TYPE:  INP   LOCATION:  3703                         FACILITY:  [facility]   PHYSICIAN:  Darlin Priestly, MD  DATE OF BIRTH:  1936-08-11   DATE OF ADMISSION:  11/21/2006  DATE OF DISCHARGE:  11/23/2006                               DISCHARGE SUMMARY   DISCHARGE DIAGNOSIS:  1. Unstable angina, resolved.  2. Coronary artery disease with percutaneous transluminal coronary      angioplasty stent deployment to the proximal left anterior      descending artery with a drug-eluting CYPHER stent.      a.     History of coronary disease with a history of previous stent       to the mid right coronary artery as well as a stent to the left       circumflex in the mid portion.  3. Hyperdynamic left ventricular function, ejection fraction of 80%.  4. Diastolic dysfunction.  5. Hypertension.  6. Hyperlipidemia.  7. Increase of wheezes during this admission, treated with nebulizers      with improvement.  8. Diabetes mellitus, type 2.  9. Obstructive sleep apnea with continuous positive airway pressure.  10.Peripheral arterial disease with mild left subclavian stenosis.  11.Obesity.  12.Arthritis.  13.Anemia.   DISCHARGE CONDITION:  Improved.   PROCEDURES:  1. On November 21, 2006, left heart cath by Dr. Lenise Herald.  2. On November 21, 2006, PTCA and stent deployment to the proximal LAD      with a drug-eluting CYPHER stent placed reducing 80% stenosis to      less than 10%.  She continues with 80% stenosis of a small      diagonal.  We will treat it medically.   DISCHARGE MEDICATIONS:  1. Plavix 75 mg daily.  2. Imdur 30 mg daily.  3. Enteric-coated aspirin 81 mg daily.  4. __________  20 mg daily.  5. Crestor 20 mg daily.  6. Diovan/HCT 160/12.5, one and a half tablets daily.  7. Januvia 100 mg daily.  8. Actos 45 mg daily.  9. Glucophage 500 mg, two tablets twice a day.  Do  not take until      November 23, 2006, in the evening.  10.Gemfibrozil 600 mg twice a day.  11.Metoprolol 50 mg twice a day.  12.Verelan PM 300 mg daily.  13.Humulin mixed insulin 75/25 as before.  14.Nitroglycerin sublingual as needed for chest pain.   DISCHARGE INSTRUCTIONS:  1. Low-fat diabetic diet.  2. No driving for 2 days.  3. No lifting for 2 days.  4. Increase activity slowly.  5. May shower and bathe.  6. Wash cath site with soap and water.  Call if any bleeding, swelling      or drainage.  7. Follow up with Dr. Domingo Sep on December 09, 2006 and 11 a.m. in the      [place] office.   HISTORY OF PRESENT ILLNESS:  A 75 year old African American female with  a history of diabetes, hypertension, hyperlipidemia, severe obstructive  sleep apnea on CPAP,  bilateral knee arthritis, known coronary disease  with an MI in 1995, with a stent to the RCA, with multiple interventions  since that time to the RCA and circumflex.  She also has known  peripheral arterial disease with mild left subclavian stenosis.   The patient had a PCI in February 2007.  She received multi-link mini  Vision to the proximal PDA and a CYPHER stent to the mid RCA.  Her  previous stent to the circumflex was stable.  Her EF was supernormal at  80% with mid cavity obliteration.  She was seen by Dr. Domingo Sep on  November 14, 2006, complaining of chest discomfort, a tightness that had  been occurring over the last two to three weeks prior to that visit,  similar to her previous angina.  It was nitroglycerin responsive.  She  has also had increased fatigue and shortness of breath.  She also has  had palpitations in the past with only PACs showing on Holter.   She was brought in to [facility] electively for cardiac  catheterization.   ALLERGIES:  1. PENICILLIN  2. MORPHINE.  3. ZOCOR.   OUTPATIENT MEDICATIONS:  Essentially the same as discharge medicine, but  we did add __________  to the medical  regimen due to some chest  discomfort she had after her intervention.   Family history, social history, review of systems, see H&P.   DISCHARGE PHYSICAL EXAMINATION:  VITAL SIGNS:  Blood pressure 169/67,  pulse 65, respiratory rate is 18, temp 98.7, oxygen saturation room air  96%.   HEART:  S1-S2.  Regular rate and rhythm.  LUNGS:  Clear.  ABDOMEN:  Positive bowel sounds.  EXTREMITIES:  Right groin 2+ pedals.  Right groin without hematoma.   LABORATORY DATA:  Hemoglobin prior to admission was 10, at discharge  hemoglobin 9.8, hematocrit 30.9, platelets 209, MCV 6.1.  Sodium 140,  potassium 4, BUN 23, creatinine 1.1, and glucose 144.  Cardiac enzymes:  CK MBs were negative with CK 103, 102, 114, and 140.  MB 1.5, 1.8, 2.2,  and 3.6.  Troponin I did peak at 0.21 prior to discharge 0.09.   HOSPITAL COURSE:  Ms. Criss was brought in electively for outpatient  cardiac catheterization which she underwent without problems and  underwent PTCA and stent deployment to the proximal LAD as previously  described.  She did well initially, was admitted to the CCU after  procedure due to some left arm discomfort.  Troponin was slightly  elevated but CK MBs were negative.  She was to ambulate on the 4th which  she did without trouble in the CCU, was transferred to a telemetry bed.  Later that day, she complained of some chest discomfort and relieved  with one nitroglycerin sublingual.  EKG was without changes.  She  continued to improve and by the next morning of November 23, 2006, she was  ready for discharge home, ambulated in the hall without any further  chest discomfort.  She will follow up as an outpatient with Dr.  Domingo Sep.      Darcella Gasman. Valarie Merino      Darlin Priestly, MD  Electronically Signed    LRI/MEDQ  D:  11/23/2006  T:  11/23/2006  Job:  [id number]   cc:   Dani Gobble, MD  Alfonse Alpers Dagoberto Ligas, M.D.

## 2011-04-06 NOTE — Consult Note (Signed)
NAMEKAELEE, Krystal [name]             ACCOUNT NO.:  [id number]   MEDICAL RECORD NO.:  [id number]           PATIENT TYPE:  AMB   LOCATION:  DAY                           FACILITY:  [facility]   PHYSICIAN:  Kassie Mends, M.D.      DATE OF BIRTH:  11-03-1936   DATE OF CONSULTATION:  07/02/2006  DATE OF DISCHARGE:                                   CONSULTATION   Dear Dr. Erby Pian:   I am seeing [name] as a new patient consultation per your request.  I  am seeing her for rectal bleeding.   [name] is a 75 year old female who reports a history of colon polyps.  She has had two colonoscopies in the past.  She is unsure of the pathology  on those polyps.  She has three episodes of bright red blood per rectum  approximately 1 month ago.  She has not seen any additional bleeding.  She  denies any fever, rectal pain or rectal itching.  She reports years ago  she had hemorrhoids.  She denies any abdominal pain, weight loss, heartburn,  indigestion, rectal urgency, difficulty swallowing, vomiting or black tarry  stools.  She has mild nausea daily which is exacerbated by eggs.  She has no  known history of diverticulosis.   PAST MEDICAL HISTORY:  Includes diabetes and acute myocardial infarction in  February2007 with placement of a right coronary artery Cypher stent.  She  has hypertension and arthritis.   PAST SURGICAL HISTORY:  Tubal pregnancy requiring surgery and multiple  cardiac stent placements.   She is allergic PENICILLIN and MORPHINE.   MEDICINE LIST:  Includes Plavix 75 mg daily, Diovan, metformin, verapamil,  isosorbide, Actos, gemfibrozil,  metoprolol, Crestor, Ecotrin 81 mg daily,  and Januvia 160 mg daily.   She denies any family history of colon cancer or colon polyps.  She is  married and has no children.  She was retired from the [employer].  She stopped smoking 1 year ago.  She denies any alcohol use.   REVIEW OF SYSTEMS:  Per the HPI, otherwise all  systems are negative.   PHYSICAL EXAMINATION:  VITAL SIGNS: Weight 212.5 pounds, height 5 feet 7  inches, BMI 33.3, temperature 99, blood pressure 132/74, pulse 68.  GENERAL:  She is in no apparent distress, alert and oriented x4.  HEENT:  Atraumatic, normocephalic.  Pupils equal, react to light.  Mouth: No oral  lesions.  Posterior pharynx without erythema or exudate.  NECK: Full range  of motion and no lymphadenopathy.  ABDOMEN:  Bowel sounds present, soft,  nontender, nondistended.  No rebound or guarding.  No hepatosplenomegaly.  Obese.  EXTREMITIES: Her extremities are without cyanosis, clubbing or  edema.  NEUROLOGIC: She has no focal neurologic deficits.   Labs from August 2007:  White count 6.9, hemoglobin 11.7, platelet 308.   [name] is a 75 year old female with rectal bleeding and microcytic  anemia.  The differential diagnoses include occult gastrointestinal blood  loss from polyps, arteriovenous malformations, hemorrhoids, a low likelihood  of gastritis, peptic ulcer disease or reflux esophagitis.  Thank you for  allowing me to see [name] in consultation.  My recommendations follow.   [name] lives alone, and we will prep for her colonoscopy at home.  She  will be admitted to [facility] for 23-hour observation due to safety  concerns after receiving conscious sedation.  If no source for her  microcytic anemia can be found in her large intestines, a  esophagogastroduodenoscopy will be performed.  If no source for her anemia  can be found on upper endoscopy or colonoscopy, then would proceed with  capsule endoscopy to complete the evaluation of her gastrointestinal tract.  I will discuss with Dr. Domingo Sep whether the aspirin and/or the Plavix may  be discontinued for the colonoscopy and upper endoscopy.  She will follow up  as needed after the procedure.   Please feel free to contact me at [phone] with additional questions.   Sincerely,      Kassie Mends, M.D.  Electronically Signed     SM/MEDQ  D:  07/02/2006  T:  07/02/2006  Job:  [id number]

## 2011-04-06 NOTE — Procedures (Signed)
Krystal [name], Krystal [name]             ACCOUNT NO.:  [id number]   MEDICAL RECORD NO.:  [id number]          PATIENT TYPE:  OUT   LOCATION:  SLEEP LAB                     FACILITY:  [facility]   PHYSICIAN:  Marcelyn Bruins, M.D. [facility] DATE OF BIRTH:  Dec 15, 1935   DATE OF STUDY:  02/25/2006                              NOCTURNAL POLYSOMNOGRAM   REFERRING PHYSICIAN:  Dani Gobble, MD.   INDICATION FOR STUDY:  Hypersomnia with sleep apnea.   EPWORTH SLEEPINESS SCORE:  4.   SLEEP ARCHITECTURE:  The patient had a total sleep time of 218 minutes with  adequate slow wave sleep at very decreased REM.  Sleep onset latency was  increased at 37 minutes and REM onset was very prolonged at 345 minutes.  Sleep efficiency was very decreased at 54%.   RESPIRATORY DATA:  The patient was found to have 226 hypopneas with 50  apneas, which gave her a respiratory disturbance index of 76 events per  hour.  The events were not positional but there was moderate snoring noted  throughout.   OXYGEN DATA:  There was O2 desaturation as low as 81% with the patient's  obstructive events.   CARDIAC DATA:  No chronically significant cardiac arrhythmias.   MOVEMENT-PARASOMNIA:  The patient was found to have very small numbers of  leg jerks with no significant sleep disruption.   IMPRESSIONS-RECOMMENDATIONS:  Severe obstructive sleep apnea/hypopnea  syndrome with a respiratory disturbance index of 76 events per hour and  oxygen desaturation as low as 81%.  Treatment for this degree of sleep apnea  should focus primarily on weight loss if applicable as well as CPAP.                                            ______________________________  Marcelyn Bruins, M.D. [facility]  Diplomate, American Board of Sleep  Medicine     KC/MEDQ  D:  03/14/2006 16:04:28  T:  03/15/2006 11:09:56  Job:  [id number]

## 2011-04-06 NOTE — Discharge Summary (Signed)
Krystal [name], Krystal [name]             ACCOUNT NO.:  [id number]   MEDICAL RECORD NO.:  [id number]          PATIENT TYPE:  INP   LOCATION:  3018                         FACILITY:  [facility]   PHYSICIAN:  Coletta Memos, M.D.     DATE OF BIRTH:  Sep 14, 1936   DATE OF ADMISSION:  02/12/2007  DATE OF DISCHARGE:  02/24/2007                               DISCHARGE SUMMARY   ADMITTING DIAGNOSES:  1. Displaced disk L5-S1.  2. Left S1, left L5 radiculopathy.   DISCHARGE DIAGNOSES:  1. Displaced disk L5-S1.  2. Left S1, left L5 radiculopathy.   PROCEDURE:  Left L5 hemilaminectomy, diskectomy, and microdissection.   INDICATIONS:  Mrs. Sovine was actually admitted to [facility]  on February 08, 2007.  As this is one hospital system, I am still unsure  how it is that her date of admission is now March 26.  Nevertheless, she  was admitted to [facility] on February 08, 2007, for back and lower  extremity pain.  MRI showed a large herniated disk.  She was transferred  to [facility] on March 26.  At that time, she informed me that she  took Plavix.  So, although she had the herniated disk and the pain and  the desire for surgery, I could not do it until the bleeding time came  back to normal.  It started at 18 on the date of transfer.  When the  bleeding time was back to normal, we were able to then procedure with  her surgery.  Postoperatively, she has done well but has been very  reluctant to leave because she lives by herself.  She had preoperative  weakness in dorsiflexion in the left lower extremity.  She did need  rehab.  She did need gait training.  At discharge, she is doing all  those things.  Wound is clean, dry, no signs of infection.  She will be  sent home with pain medication, Vicodin and Flexeril.           ______________________________  Coletta Memos, M.D.     KC/MEDQ  D:  02/24/2007  T:  02/24/2007  Job:  [id number]

## 2011-04-06 NOTE — Op Note (Signed)
NAME:  Krystal [name], Krystal [name]             ACCOUNT NO.:  [id number]   MEDICAL RECORD NO.:  [id number]          PATIENT TYPE:  OBV   LOCATION:  A326                          FACILITY:  [facility]   PHYSICIAN:  Kassie Mends, M.D.      DATE OF BIRTH:  Jun 30, 1936   DATE OF PROCEDURE:  10/07/2006  DATE OF DISCHARGE:                                 OPERATIVE REPORT   PROCEDURE PERFORMED:  Colonoscopy   INDICATIONS FOR PROCEDURE:  Krystal [name] is a 75 year old female who has a  personal history of polyps.  She presents with rectal bleeding.   FINDINGS:  1. Moderate internal hemorrhoids.  Otherwise no polyps, masses,      inflammatory changes or vascular ectasia seen.   RECOMMENDATIONS:  1. High fiber diet.  Patient is given a handout on high fiber diet,      polyps, and management of hemorrhoids.  2. She has a return appointment to see me in two weeks.  3. If she continues to have problems with hemorrhoids, then I will give      her a referral to general surgery.  4. Screening colonoscopy in five years.   MEDICATIONS:  1. Demerol 75 mg  IV.  2. Versed 3 mg IV.   DESCRIPTION OF PROCEDURE:  Physical exam was performed and informed consent  was obtained from the patient after explaining the risks, benefits and  alternatives to the procedure.  The patient was connected to the monitor and  placed in left lateral position.  Continuous oxygen was provided by nasal  cannula and IV medicine administered through an indwelling cannula.  After  administration of sedation and rectal exam, the patient's rectum  was intubated and scope was advanced under direct visualization to the  cecum.  The scope was subsequently removed slowly by carefully examining the  color, texture, anatomy and integrity of the mucosa on the way out.  The  patient was recovered in the endoscopy suite and admitted to the floor in  satisfactory condition.      Kassie Mends, M.D.  Electronically Signed     SM/MEDQ  D:  1Feb 11, 202007   T:  1Feb 11, 202007  Job:  [id number]   cc:   Alfonse Alpers. Dagoberto Ligas, M.D.  Fax: [phone]   Annia Friendly. Loleta Chance, MD  Fax: [phone]   Dani Gobble, MD  Fax: [phone]

## 2011-04-06 NOTE — Consult Note (Signed)
NAMEKARESHA, TRZCINSKI             ACCOUNT NO.:  [id number]   MEDICAL RECORD NO.:  [id number]          PATIENT TYPE:  OBV   LOCATION:  A219                          FACILITY:  [facility]   PHYSICIAN:  J. Darreld Mclean, M.D. DATE OF BIRTH:  [birth year]   DATE OF CONSULTATION:  DATE OF DISCHARGE:  02/12/2007                                 CONSULTATION   Patient was seen in consultation, as requested by the hospitalist.   Patient is a 75 year old female with pain and tenderness involving her  left leg with pain radiating down to the toes on the left side.  She has  progressively gotten worse.  She has had a CT scan and more recently an  MRI scan, showing L5 nerve root impingement of a herniated nucleus  pulposus fragment on the left side.  This is a significant fragment.  This has not been relieved by medications or rest.   I talked to the patient, examined the patient.  I feel she needs to see  a neurosurgeon for neurosurgical procedure.  Back surgery is not done at  this institution.  Recommend neurosurgical consultation.           ______________________________  Shela Commons. Darreld Mclean, M.D.     JWK/MEDQ  D:  02/12/2007  T:  02/12/2007  Job:  [id number]

## 2011-04-06 NOTE — Cardiovascular Report (Signed)
NAMEAMORINA, Krystal [name]             ACCOUNT NO.:  [id number]   MEDICAL RECORD NO.:  [id number]          PATIENT TYPE:  OIB   LOCATION:  6526                         FACILITY:  [facility]   PHYSICIAN:  Darlin Priestly, MD  DATE OF BIRTH:  [birth year]   DATE OF PROCEDURE:  11/10/2004  DATE OF DISCHARGE:  11/11/2004                              CARDIAC CATHETERIZATION   PROCEDURE:  1.  Left heart catheterization.  2.  Coronary angiography.  3.  Left ventriculogram.  4.  Bilateral renal angiogram.  5.  Posterior descending artery/proximal percutaneous transluminal coronary      balloon angioplasty.  6.  Distal right coronary artery cutting balloon angioplasty.   INDICATIONS:  Ms. Sanborn is a 75 year old female, patient of Dr. Annia Friendly.  Hill and Dr. Kem Boroughs, with a history of diabetes, ongoing tobacco use,  hypertension, history of distal RCA stenting in 1999 with repeat circumflex  stenting in 1999 with repeat circumflex stenting at [facility].  The  patient recently saw Dr. Kem Boroughs complaining of left arm discomfort  and is now scheduled for cardiac catheterization to evaluate her distal RCA  stent as well as the circumflex stent.   DESCRIPTION OF PROCEDURE:  After informed consent, the patient was brought  to the cardiac catheter lab.  The right groin was shaved, prepped, and  draped in the usual sterile fashion.  ECG monitoring.  Stab with modified  Seldinger technique, 6 French arterial sheath in the right femoral artery.  A 6 French diagnostic catheter was used to perform diagnostic angiography.   RESULTS:  1.  The left main was a large vessel with no significant disease.  2.  LAD is a large vessel which courses back through two diagonal branches.      The LAD is noted to have proximal irregularities up to 40-50% in the      proximal vessel after takeoff of the first small diagonal.  3.  The first diagonal was a small vessel with 60% ostial lesion.  The  second diagonal was a medium-sized vessel with no significant disease.  4.  Left circumflex is a medium-sized vessel which coursed in the AV groove      and through the obtuse marginal branches.  The AV groove circumflex has      a widely patent mint AV groove circumflex stent.  5.  The first, second, and third OM were medium-sized vessels with no      significant disease.  6.  The right coronary artery was a large vessel which was dominant.  It      gave rise to both PDA as well as posterolateral branch.  There are      stents noted in the distal portion of the RCA with diffuse 70% end-stent      restenosis.  There is 40% diffuse proximal irregularities with a 60% mid-      RCA stenosis.  7.  The PDA is a medium-sized vessel with 80% proximal lesion.  The PLA is a      small vessel with 60% ostial lesion.   LEFT VENTRICULOGRAM:  Reveals preserved EF of 60%.   ANGIOGRAM:  Bilateral selective renal angiogram revealed no evidence of  significant renal artery stenosis.   HEMODYNAMIC DATA:  1.  Systemic arterial pressure 165/75.  2.  LV pressure 164/17.  3.  LVP of 24.   INTERVENTIONAL PROCEDURE:  PDA/proximal:  Following angiography, a 6 [place]  JR4 guiding catheter was threaded through right coronary ostium.  A 0.014  AGW marker was inserted beside the guiding catheter and positioned in the  distal PDA without difficulty.  Following this, a Maverick 2.0 by 9 mm  balloon was then tracked across the proximal portion of the PDA and three  subsequent inflations to 12 atmospheres were performed for a total of  approximately 1 minute and 50 seconds.  Follow-up angiogram revealed good  luminal game with no evidence of dissection and thrombus and TIMI III flow  to this vessel.  This balloon was then removed and a 2.5 by 10 mm cutting  balloon was then advanced into the distal portion of the distal RCA stent.  Approximately 6 inflations were then performed throughout the distal mid and   proximal portion of the distal RCA stent to maximum of 6 atmospheres.   A follow-up angiogram again showed good luminal gain; however, there still  appeared to be 30-40% end-stent restenosis.  This balloon was then exchanged  for a 2.75 by 10 mm balloon which was advanced into the distal portion of  the RCA stent.  Three overlapping inflations were then performed throughout  the distal, mid, and proximal RCA stent to a maximum of 6 atmospheres for a  total of approximately 1 minute and 40 seconds.  Follow-up angiogram  revealed no evidence of dissection of thrombus with TIMI III flow to the  distal vessel.  IV Reopro was used throughout the case and a dose of heparin  was given to maintain the ACT between 200 and 300.   Final angiograms revealed less than 20% residual stenosis in the proximal  PDA with approximately 20% end-stent restenosis throughout the distal RCA  stent.  At this point, we elected to conclude the procedure.  All balloons,  lines, and catheters were removed.  Hemostatic sheath was sewn in place and  the patient went back to the ward in stable condition.   CONCLUSION:  1.  Successful percutaneous transluminal coronary angioplasty of the      proximal posterior descending coronary artery with nonocclusion.  2.  Successful cutting balloon angioplasty of the distal right coronary      artery with end-stent restenosis.  3.  Systemic hypertension.  4.  Normal left ventricular systolic function.  5.  Normal renal arteries.  6.  Elevated left ventricular end-diastolic pressure.  7.  Adjuvant case of Reopro infusion.      [place]   RHM/MEDQ  D:  11/10/2004  T:  11/12/2004  Job:  [id number]   cc:   Kem Boroughs, M.D.   Annia Friendly. Loleta Chance, MD  [address]  [place]  [place] [address]  Fax: [phone]

## 2011-04-06 NOTE — Discharge Summary (Signed)
Krystal [name], Krystal [name]             ACCOUNT NO.:  [id number]   MEDICAL RECORD NO.:  [id number]          PATIENT TYPE:  OIB   LOCATION:  6526                         FACILITY:  [facility]   PHYSICIAN:  Darlin Priestly, MD  DATE OF BIRTH:  23-Apr-1936   DATE OF ADMISSION:  11/10/2004  DATE OF DISCHARGE:  11/11/2004                                 DISCHARGE SUMMARY   DISCHARGE DIAGNOSES:  1.  Angina.  2.  Coronary disease with percutaneous coronary intervention x 2, cutting      balloon angioplasty into the distal right coronary artery and      angioplasty to the posterior descending artery proximally reducing      stenosis from 80 to 0%.  Ejection fraction 60%.  3.  Tobacco abuse.  4.  Hypertension.  5.  Diabetes.  6.  Osteoarthritis.   DISCHARGE CONDITION:  Improved.   PROCEDURES:  1.  November 10, 2004:  Combined left heart catheterization by Darlin Priestly, M.D.  2.  November 10, 2004:  Angioplasty of the posterior descending artery      proximally and cutting balloon angioplasty to the right coronary      distally.   DISCHARGE MEDICATIONS:  1.  Plavix 75 mg daily, do not stop taking.  2.  Enteric-coated aspirin 81 mg daily.  3.  Diovan 160 mg daily.  4.  Metformin 500 mg 1 twice a day, do not take until Sunday evening.  5.  Glucotrol-XL 10 mg 1 twice a day.  6.  Lopressor 50 mg 1 twice a day.  7.  Stop  Zantac.  8.  Lopid 600 mg twice a day.  9.  Zocor 20 mg 1 every evening.  10. Verapamil 240 mg daily.  11. Furosemide as needed as before.  12. KlorCon 20 mEq as needed as before with the Lasix.  13. Nitroglycerin sublingual p.r.n. chest pain.  14. Protonix 40 mg once daily.   DISCHARGE INSTRUCTIONS:  1.  No strenuous activity for 48 hours, then resume regular activities.  2.  Watch diet closely over the weekend while off Glucophage, the resume low-      fat, low-salt, diabetic diet.  3.  Wash right groin catheterization site with soap and water, call if any    bleeding, swelling, or drainage.  4.  Follow up with Dr. Domingo Sep in the office, will call you with the date      and time.   HISTORY OF PRESENT ILLNESS:  Krystal [name] is a 75 year old African-American  female who presented to Dr. Roque Lias office at the request of Dr. Loleta Chance for  evaluation of her chest pain.  She had a history of coronary disease with an  MI in 1999, and undergoing a cardiac catheterization and stenting of the  right coronary and the in 2000, she underwent cardiac catheterization with a  second stent implanted in the RCA.  These were performed at [facility] in [place].   The patient was experiencing left arm pain, some tightness in the chest, no  sweating or diaphoresis or nausea.  She  was using nitroglycerin, but no more  frequently than usual.   It we decided to bring her in for cardiac catheterization to evaluate her  stent in this diabetic patient.   PAST MEDICAL HISTORY:  Other than cardiac, diabetes mellitus, hypertension,  hyperlipidemia, bronchitis, and bilateral knee arthritis.   FAMILY HISTORY, SOCIAL HISTORY, AND REVIEW OF SYSTEMS:  See H&P.   ALLERGIES:  MORPHINE and PENICILLIN.   OUTPATIENT MEDICATIONS:  Essentially the same as inpatient except now we  have decreased her aspirin and added Plavix as well as stopped the Zantac  and added Protonix.   PHYSICAL EXAMINATION AT DISCHARGE:  VITAL SIGNS:  Blood pressure 124/50,  pulse 68, respirations 20, temperature 98.2, oxygen saturation 92%.  GENERAL:  Alert and oriented African-American female with no problems.  HEART:  Regular rate and rhythm.  LUNGS:  Clear.  RIGHT GROIN:  Catheterization site stable. No hematoma.  Pedal pulses are  present.   LABORATORY VALUES:  Post-procedure hemoglobin 10.7, hematocrit 33, WBC 8.2,  platelets 296,000.  Sodium 135, potassium 3.9, BUN 13, creatinine 1, and  glucose 282.  CK-MB 106 with 2.4 and troponin-I 0.05.  Prior to procedure,  hemoglobin was 12.9,  hematocrit 40.8.  That should be followed as an  outpatient.   Chest x-ray done on October 09, 2004:  Mild bronchitic changes, upper  normal heart size, normal mediastinal contours and vascularity, no  infiltrate, effusion, or pneumothorax.   HOSPITAL COURSE:  Krystal [name] was brought in as an outpatient on November 10, 2004, underwent cardiac catheterization and interventions as previously  described.  Please see Dr. Mikey Bussing dictated note for further details.  She  remained stable during that evening.  On the next morning, she was stable,  ambulated without problems, and was felt ready for discharge home by Dr.  Alanda Amass who discharged her.      Laur   LRI/MEDQ  D:  11/11/2004  T:  11/13/2004  Job:  [id number]   cc:   Dani Gobble, MD  Fax: [phone]   Annia Friendly. Loleta Chance, MD  [address]  [place]  [place] [address]  Fax: [phone]

## 2011-04-06 NOTE — H&P (Signed)
NAMEHALAYNA, Krystal [name]             ACCOUNT NO.:  [id number]   MEDICAL RECORD NO.:  [id number]          PATIENT TYPE:  INP   LOCATION:  A330                          FACILITY:  [facility]   PHYSICIAN:  Margaretmary Dys, M.D.DATE OF BIRTH:  Jan 25, 1936   DATE OF ADMISSION:  02/08/2007  DATE OF DISCHARGE:  LH                              HISTORY & PHYSICAL   ADMISSION DIAGNOSES:  1. Severe back pain.  2. Degenerative disk disease of the lumbar spine.  3. Severe spinal stenosis.  4. Poorly controlled diabetes.  5. History of coronary artery disease with recent stent placement in      January, 2008.   CHIEF COMPLAINT:  Severe back pain over the past 2 weeks.   HISTORY OF PRESENT ILLNESS:  Krystal [name] is a 75 year old African-  American female with multiple medical problems who presented to the  emergency room with complaint of severe back pain. She describes the  pain as a 10 out of 10 at its worst, exacerbated by standing up  straight. The pain is relieved by sitting. It is a constant dull ache  and pain radiates to her left lower extremity with paresthesias. She  denies any fecal or urinary incontinence. She has not had any urinary  retention. She has had difficulty ambulating around her house. The  patient called 911 to bring her to the emergency room. She lives alone.  She reports she has been having some pain on and off for the past 1 year  but this has been much more severe over the past 2 weeks and appear to  be lasting much longer. She has never had any evaluation of her back and  was never told if she had any lumbar disk disease. The patient was  evaluated in the emergency room and a CT scan of the lumbar spine which  is reviewed below showed severe stenosis with significant disk  protrusion.  The patient has now been admitted for pain control and  further evaluation with MRI and possibly orthopedic surgery evaluation.   REVIEW OF SYSTEMS:  The patient denies any chest pain. No  nausea or  vomiting. No abdominal pain. No headaches or dizziness. She has had no  diarrhea. She overall feels well other than the severe back pain  radiating to her left leg.   PAST MEDICAL HISTORY:  1. Coronary artery disease with percutaneous transluminal coronary      angioplasty deployment to the left anterior descending artery with      a drug eluting stent in January, 2008.  2. History of coronary artery disease with a previous tent to the      right coronary artery as well as a stent to the left circumflex in      the mid portion.  3. Hyperdynamic left ventricular function with an ejection fraction of      80%.  4. Diastolic dysfunction.  5. Hypertension.  6. Hyperlipidemia.  7. Probable asthma/chronic obstructive pulmonary disease.  8. Type 2 diabetes.  9. Obstructive sleep apnea on continuous positive airway pressure.  10.Peripheral artery disease with mild left subclavian stenosis.  11.Obesity.  12.Degenerative joint disease involving the lower extremities.  13.History of chronic anemia.   CURRENT MEDICATIONS:  1. Plavix 75 mg p.o. daily.  2. Diovan 160/12.5 p.o. daily.  3. Actos 45 mg p.o. daily.  4. Gemfibrozil 600 mg p.o. daily.  5. Isosorbide mononitrate 39 mg p.o. daily.  6. Crestor 20 mg p.o. daily.  7. Verelan 300 mg p.o. daily.  8. Glipizide 10 mg p.o. daily.  9. Metoprolol (dose unclear).  10.Metformin 500 mg p.o. b.i.d.  11.Januvia 100 mg p.o. daily.   ALLERGIES:  PENICILLIN (causes a rash). MORPHINE (caused  hallucinations).   SOCIAL HISTORY:  The patient is single. She had a child die several  years ago. Patient used to smoke but quit smoking. She denies any  illicit drug use. No recent travel.   FAMILY HISTORY:  Positive for hypertension, coronary artery disease and  diabetes.   PHYSICAL EXAMINATION:  GENERAL: Conscious and alert. Patient is in pain  and distress. Alert and oriented to time, place and person.  VITAL SIGNS: Blood pressure was  163/68, pulse of 83, respiration was 22,  T-max 97.3, oxygen saturation was 98% on room air.  HEENT EXAM: Normocephalic, atraumatic. Oral mucosa was moist with no  exudates.  NECK: Supple, no JVD or lymphadenopathy.  LUNGS: Clear to auscultation with good air entry bilaterally.  HEART: S1, S2 regular, no S3, gallops or rubs.  ABDOMEN: Obese but soft, nontender, bowel sounds positive.  EXTREMITIES: No pretibial edema. No calf induration or tenderness was  noted. No muscle wasting was observed.  CNS EXAM: The patient was conscious, alert and oriented to time. place  and person. She did have significant tenderness over the lower lumbar  region with guarding. Reflexes were diminished in both lower  extremities. Sensation was intact.  POWER: Normal though exam was restricted due to her pain. The patient  was unable to complete a straight leg raise of the left leg.   LABORATORY/DIAGNOSTIC DATA:  White blood cell count was 7300, hemoglobin  10.3, hematocrit 32.7, platelet count was 316,000.  Sodium 136,  potassium 3.8, chloride 97, CO2 31, glucose 160, BUN 15, creatinine 1.1.  Calcium was 9.3.  Cardiac enzymes were negative.   A CT scan of the lumbar spine showed L5/S1 left sided disk protrusion  with mass effect as described above.  An MRI was advised.   L3-4 and L5 multifactorial spinal stenosis and prominent calcifications  of the aorta without any focal aneurysm.   ASSESSMENT AND PLAN:  PROBLEM #1: Krystal [name] is a 75 year old African-  American female who presents to the emergency room with complaints of  severe back pain with radiation to the left leg.  The patient denies any  prior history of trauma but she does remember trying to carry a heavy  box about 2 weeks ago and is not sure if she heard a pop sound.  Evaluation did not reveal any neurological deficits at this time but a  CT scan of the lumbar spine shows an L5-S1 disk protrusion with mass effect.  The patient is now being  admitted for further evaluation. She  will need an MRI or a post myelogram CT to further evaluate for these  findings.  In the meantime we will control her pain aggressively.  I  will also request orthopedic surgery to give [place] any input that they may  have on what needs to be done.  If patient develops neurologic symptoms  I will plan to transfer her urgently to a  neurosurgical spine surgery  center in [place] or [place] or [place]. I will also contemplate  steroids if that happens, but right now I think pain control is the big  issue and will get additional data.  from the MRI.   PROBLEM #2: Diabetes mellitus. Blood sugar is slightly elevated which  could be a stress response. Will continue on current oral hyperglycemia  agents and also sliding scale insulin.   PROBLEM #3: Poorly controlled hypertension. Will continue home  medications, put on Metoprolol 25 mg p.o. b.i.d. until we have the  actual dose of what she is on at home. Hypertension and increased  respiratory may also be a reflection of her pain. Hopefully pain control  will alleviate this.   DISPOSITION:  Will control pain, request MRI of the lumbar spine or a  post myelogram CT scan to help determine the next course of action.  Orthopedic surgical consult is pending.      Margaretmary Dys, M.D.  Electronically Signed     AM/MEDQ  D:  02/09/2007  T:  02/09/2007  Job:  [id number]   cc:   Franchot Heidelberg, M.D.

## 2011-04-06 NOTE — Op Note (Signed)
[name], [name]             ACCOUNT NO.:  [id number]   MEDICAL RECORD NO.:  [id number]          PATIENT TYPE:  INP   LOCATION:  3018                         FACILITY:  [facility]   PHYSICIAN:  Coletta Memos, M.D.     DATE OF BIRTH:  14-Mar-1936   DATE OF PROCEDURE:  02/18/2007  DATE OF DISCHARGE:                               OPERATIVE REPORT   PREOPERATIVE DIAGNOSIS:  1. Displaced distal left L5-S1.  2. Left S1 and left L5 radiculopathy.   POSTOPERATIVE DIAGNOSES:  1. Displaced distal left L5-S1.  2. Left S1 and left L5 radiculopathy.   PROCEDURE:  Left L5 hemilaminectomy and discectomy with microdissection.   COMPLICATIONS:  None.   FINDINGS:  Large pieces of fragmented disc in the epidural space rostral  to the L5-S1 disc space.   SURGEON:  Coletta Memos, M.D.   ASSISTANT:  None.   ANESTHESIA:  General endotracheal.   INDICATIONS:  Krystal [name] is a 75 year old who was first admitted to  [facility] on February 08, 2007.  She was admitted secondary to  severe pain in the lower extremities and her back.  She called an  ambulance and was taken to the [facility].  A workup performed  there included an MRI of the lumbar spine which showed a herniated disc  at L5-S1 with a probable free fragment.  She was transferred to me on  March 26 but I could not proceed with surgery as she was on Plavix.  Her  bleeding time at that juncture was 18.5 minutes.  I, therefore, held her  Plavix and she is taken to the operating room today for discectomy.   OPERATIVE NOTE:  Krystal [name] was brought to the operating room.  She was  intubated and placed under general anesthetic without difficulty.  She  was rolled prone onto a Wilson frame and all pressure points properly  padded.  Her back was prepped and she was draped in a sterile fashion.  I infiltrated 20 mL 0.5% lidocaine with 1:100,000 epinephrine into the  lumbar region subcutaneously.  I opened the skin with a #10  blade and I  took this down to the thoracolumbar fascia sharply.  I then exposed two  lamina, I placed a double ended ganglion knife, and that showed that the  superior lamina of my exposure was L4.  I then moved down one level and  performed a hemilaminectomy using a high speed drill.  I did this until  I was able to see the rostral edge of the ligamentum flavum.  I then  removed that in a rostral to caudal direction and exposed the thecal  sac.  I brought the microscope into the operative field to aid in  microdissection.  I retracted the thecal sac medially and probed for  what I believed to be free fragments.  The disc space appeared  relatively flat.  There was some calcification to it.  I then, after  working with a blunt tipped probe, saw what appeared to me to be disc  material.  I was able to pull out a number of  fragments which were  rather large using a Kerrison punch.  I then appreciated the opening  from the disc space to the epidural space.  At that time, I tried to  remove more and more of the material while I was in that opening, but  that led me to finally opened the disc space at L5-S1.  I encountered  what was a severely degenerated disc and removed loose disc material  from the endplate which was also loose.  I then probed again rostrally  and pulled out more free fragments underlying the L5 root.  Again, with  microdissection, I checked finally after I finished my decompression of  the disc space.  The nerve root of L5, the nerve root of S1, and the  thecal sac were all well decompressed.  I irrigated the wound.  I then  closed the wound in a layered fashion using Vicryl sutures.  Dermabond  was used for a sterile dressing.           ______________________________  Coletta Memos, M.D.     KC/MEDQ  D:  02/18/2007  T:  02/18/2007  Job:  [id number]

## 2011-04-06 NOTE — Procedures (Signed)
Krystal [name], Krystal [name]             ACCOUNT NO.:  [id number]   MEDICAL RECORD NO.:  [id number]            PATIENT TYPE:   LOCATION:                                 FACILITY:   PHYSICIAN:  Edward L. Juanetta Gosling, M.D.DATE OF BIRTH:  1935-11-24   DATE OF PROCEDURE:  DATE OF DISCHARGE:                                EKG INTERPRETATION   The rhythm is sinus rhythm with a rate of 70s. There is first degree AV  block. Right atrial enlargement is seen. There is slow R-wave progression  across the precordium with Q-waves in V1 and 2, suggestive of a previous  anteroseptal myocardial infarction. T-wave abnormalities which were  nonspecific are seen laterally. Abnormal electrocardiogram.      ELH/MEDQ  D:  01/12/2005  T:  01/12/2005  Job:  [id number]

## 2011-04-16 ENCOUNTER — Emergency Department (HOSPITAL_COMMUNITY): Admission: EM | Admit: 2011-04-16 | Payer: Medicare Other | Source: Home / Self Care

## 2011-04-16 ENCOUNTER — Inpatient Hospital Stay (HOSPITAL_COMMUNITY)
Admission: AD | Admit: 2011-04-16 | Discharge: 2011-04-18 | DRG: 287 | Disposition: A | Discharge status: Home Health | Payer: Medicare Other | Source: Ambulatory Visit | Attending: Cardiovascular Disease | Admitting: Cardiovascular Disease

## 2011-04-16 DIAGNOSIS — J449 Chronic obstructive pulmonary disease, unspecified: Secondary | ICD-10-CM | POA: Diagnosis present

## 2011-04-16 DIAGNOSIS — G4733 Obstructive sleep apnea (adult) (pediatric): Secondary | ICD-10-CM | POA: Diagnosis present

## 2011-04-16 DIAGNOSIS — E669 Obesity, unspecified: Secondary | ICD-10-CM | POA: Diagnosis present

## 2011-04-16 DIAGNOSIS — I2510 Atherosclerotic heart disease of native coronary artery without angina pectoris: Principal | ICD-10-CM | POA: Diagnosis present

## 2011-04-16 DIAGNOSIS — E119 Type 2 diabetes mellitus without complications: Secondary | ICD-10-CM | POA: Diagnosis present

## 2011-04-16 DIAGNOSIS — I10 Essential (primary) hypertension: Secondary | ICD-10-CM | POA: Diagnosis present

## 2011-04-16 DIAGNOSIS — J4489 Other specified chronic obstructive pulmonary disease: Secondary | ICD-10-CM | POA: Diagnosis present

## 2011-04-16 LAB — GLUCOSE, CAPILLARY: Glucose-Capillary: 308 mg/dL — ABNORMAL HIGH (ref 70–99)

## 2011-04-16 LAB — CBC
HCT: 40.7 % (ref 36.0–46.0)
Hemoglobin: 12.6 g/dL (ref 12.0–15.0)
RBC: 5.36 MIL/uL — ABNORMAL HIGH (ref 3.87–5.11)
WBC: 7.2 10*3/uL (ref 4.0–10.5)

## 2011-04-16 LAB — PROTIME-INR
INR: 1.01 (ref 0.00–1.49)
Prothrombin Time: 13.5 seconds (ref 11.6–15.2)

## 2011-04-16 LAB — BASIC METABOLIC PANEL
Chloride: 102 mEq/L (ref 96–112)
GFR calc Af Amer: 35 mL/min — ABNORMAL LOW (ref >60)
GFR calc non Af Amer: 29 mL/min — ABNORMAL LOW (ref >60)
Potassium: 4 mEq/L (ref 3.5–5.1)
Sodium: 141 mEq/L (ref 135–145)

## 2011-04-17 ENCOUNTER — Ambulatory Visit (HOSPITAL_COMMUNITY): Admission: RE | Admit: 2011-04-17 | Payer: Medicare Other | Source: Ambulatory Visit | Admitting: Cardiovascular Disease

## 2011-04-17 LAB — GLUCOSE, CAPILLARY
Glucose-Capillary: 133 mg/dL — ABNORMAL HIGH (ref 70–99)
Glucose-Capillary: 176 mg/dL — ABNORMAL HIGH (ref 70–99)
Glucose-Capillary: 224 mg/dL — ABNORMAL HIGH (ref 70–99)

## 2011-04-17 LAB — BASIC METABOLIC PANEL
BUN: 24 mg/dL — ABNORMAL HIGH (ref 6–23)
Calcium: 9.1 mg/dL (ref 8.4–10.5)
GFR calc non Af Amer: 31 mL/min — ABNORMAL LOW (ref >60)
Glucose, Bld: 147 mg/dL — ABNORMAL HIGH (ref 70–99)
Potassium: 3.7 mEq/L (ref 3.5–5.1)
Sodium: 143 mEq/L (ref 135–145)

## 2011-04-18 LAB — CBC
MCV: 75.5 fL — ABNORMAL LOW (ref 78.0–100.0)
Platelets: 208 10*3/uL (ref 150–400)
RBC: 4.98 MIL/uL (ref 3.87–5.11)
WBC: 6.5 10*3/uL (ref 4.0–10.5)

## 2011-04-18 LAB — BASIC METABOLIC PANEL
BUN: 23 mg/dL (ref 6–23)
Chloride: 103 mEq/L (ref 96–112)
Potassium: 3.7 mEq/L (ref 3.5–5.1)

## 2011-04-18 LAB — GLUCOSE, CAPILLARY
Glucose-Capillary: 160 mg/dL — ABNORMAL HIGH (ref 70–99)
Glucose-Capillary: 180 mg/dL — ABNORMAL HIGH (ref 70–99)

## 2011-05-03 NOTE — Discharge Summary (Addendum)
  NAMERUSHIE, Krystal [name]             ACCOUNT NO.:  [id number]  MEDICAL RECORD NO.:  [id number]           PATIENT TYPE:  E  LOCATION:  [facility]                         FACILITY:  [facility]  PHYSICIAN:  Nicki Guadalajara, M.D.     DATE OF BIRTH:  08-19-1936  DATE OF ADMISSION:  04/16/2011 DATE OF DISCHARGE:  04/18/2011                              DISCHARGE SUMMARY   DISCHARGE DIAGNOSES: 1. Chest pain consistent with unstable angina. 2. Abnormal outpatient Myoview. 3. Previous left anterior descending, circumflex, and right coronary     artery stenting, patent this admission. 4. Type 2 insulin-dependent diabetes. 5. Treated hypertension. 6. Treated dyslipidemia. 7. Chronic obstructive pulmonary disease, on chronic O2. 8. Morbid obesity. 9. Sleep apnea, on continuous positive airway pressure., 10.Stage III chronic renal insufficiency with a creatinine of 1.7.  HOSPITAL COURSE:  The patient is a 75 year old female who has been seen by our group in the past.  She has had multiple interventions dating back to 6.  Her last catheterization was in 2006.  She recently was seen in the office by Dr. Tresa Endo on Apr 10, 2011.  She had been complaining of some chest pain that was concerning for unstable angina. She had an outpatient Myoview that was abnormal with a suggestion of new ischemia.  It was decided to admit her for diagnostic catheterization. She was admitted on Apr 16, 2011, for hydration.  Her diuretic was cut back.  Creatinine was 1.7 on admission.  She underwent diagnostic catheterization on Apr 17, 2011.  This revealed a patent RCA proximal stent, 40-50% narrowing in the RCA distal stent and a PDA stent. Circumflex stent was patent and the vessel was otherwise open.  The LAD had a patent proximal stent with 50% narrowing in the native LAD after the stent.  The first diagonal had 70% narrowing.  Renal arteries and iliacs were normal.  Her EF was 70% with LVH.  Plan is for continued medical  therapy.  She was kept overnight to monitor her renal function. The patient does live alone and has trouble getting around some.  We asked care manager see her for home health nurse visits.  We feel she can be discharged on Apr 18, 2011.  Please see med rec for complete discharge medications.  LABORATORY DATA AT DISCHARGE:  White count 6.5, hemoglobin 11.7, hematocrit 37.6, and platelets 208.  Sodium 141 potassium 3.7, BUN 23, creatinine 1.75.  INR was 1.01.  EKG shows sinus rhythm, poor anterior R- wave progression, nonspecific ST changes.  DISPOSITION:  The patient is discharged in stable condition and will follow up with Dr. Tresa Endo in a couple of weeks.  We did cut her Demadex dose back.     Abelino Derrick, P.A.   ______________________________ Nicki Guadalajara, M.D.    Lenard Lance  D:  04/18/2011  T:  04/19/2011  Job:  [id number]  cc:   Annia Friendly. Loleta Chance, MD  Electronically Signed by Corine Shelter P.A. on 04/19/2011 04:22:35 PM Electronically Signed by Nicki Guadalajara M.D. on 05/03/2011 13:08:65 PM

## 2011-05-03 NOTE — Cardiovascular Report (Signed)
Krystal [name], Krystal [name] NO.:  [id number]  MEDICAL RECORD NO.:  [id number]           PATIENT TYPE:  LOCATION:                                 FACILITY:  PHYSICIAN:  Nicki Guadalajara, M.D.     DATE OF BIRTH:  02-07-1936  DATE OF PROCEDURE:  04/17/2011 DATE OF DISCHARGE:                           CARDIAC CATHETERIZATION   INDICATIONS:  Krystal [name] is a 75 year old African American female who has known coronary artery disease dating back to the 1990s. She has undergone multiple stent procedures and has stents located in the proximal LAD, AV groove circumflex, mid right coronary artery, and distal right coronary artery.  Her last catheterization was done in December 2006 which showed 50% narrowing in RCA and 70% distal RCA narrowing.  Recently, she underwent a stress test for recurrent chest pain.  She does have a history of significant obesity.  The stress study raised the possibility of a new area of mild ischemia in the mid anteroseptal region; however, breast attenuation could not be completely excluded.  Her chest pain has been somewhat nitrate responsive.  Because of risk factors and symptomatology, definitive repeat catheterization was recommended.  Due to a subsequent laboratory coming back showing renal insufficiency, she was brought in the day before to initiate hydration prior to her catheterization procedure.  PROCEDURE:  After premedication with Versed 1 mg plus fentanyl 25 mcg, the patient was prepped and draped in usual fashion.  Her right femoral artery was punctured anteriorly and a 5-French sheath was inserted. Diagnostic catheterization was done utilizing a 5-French Judkins for left and right coronary catheters.  A 5-French pigtail catheter was used for RAO ventriculography.  Distal aortography was also performed to make certain she did not have any renovascular etiology to her hypertension. Prior to breaking scrub, the angiographic findings  were reviewed with the patient's 2006 study.  Hemostasis was then obtained by direct manual pressure.  She tolerated the procedure well.  HEMODYNAMIC DATA:  Central aortic pressure 160/60.  Left ventricular pressure 160/24.  ANGIOGRAPHIC DATA: 1. Left main coronary was angiographically normal and bifurcated into     an LAD and left circumflex system. 2. The stent in the proximal LAD was widely patent.  The septal     perforating artery and first diagonal vessel arose within the     stented segment.  There was diffuse narrowing of 70% in the first     diagonal branch of the LAD.  Just beyond the LAD stent was     narrowing of approximately 50%.  The remainder of the LAD was free     of significant disease and gave rise to a moderate-sized second     diagonal vessel and extended to the apex. 3. The circumflex vessel gave rise to two marginal vessels.  The mid     AV groove circumflex stent was widely patent. 4. The right coronary artery was moderate-sized vessel.  The stent in     the proximal to mid RCA had 30% intimal hyperplasia but was free of     significant obstructive disease.  Beyond the acute margin, the  distal stent seemed to have diffuse narrowing of 40% to 50% due to     intimal hyperplasia.  It also appeared that there may have been a     stent at the ostium of the PDA, although this was hard to tell but     there did appear to be 40% narrowing in this proximal PDA segment.     There was brisk TIMI 3 flow. 5. RAO ventriculography revealed hyperdynamic LV function with an     ejection fraction greater than 70% with evidence for moderate left     ventricular hypertrophy. 6. Distal aortography did not demonstrate any renal artery stenosis or     significant aortoiliac disease.  IMPRESSION: 1. Hyperdynamic left ventricular function. 2. The mild moderate coronary artery disease with widely patent     previously placed proximal LAD stent but with 70% narrowing in the      first diagonal branch arising from the stented segment, 50%     narrowing in the left anterior descending at the distal aspect and     just beyond the stented segment in the left anterior descending. 3. Widely patent left circumflex atrioventricular groove stent. 4. Dominant right coronary artery with 30% intimal hyperplasia in the     proximal to mid right coronary artery stent and evidence for     diffuse 40% to 50% intimal hyperplasia in the distal right coronary     artery stent with 40% posterior descending artery stenosis.  The     patient will be aggressively hydrated following the catheterization     procedure.  Medications will be adjusted and titrated.          ______________________________ Nicki Guadalajara, M.D.     TK/MEDQ  D:  04/17/2011  T:  04/18/2011  Job:  [id number]  cc:   Annia Friendly. Loleta Chance, MD  Electronically Signed by Nicki Guadalajara M.D. on 05/03/2011 04:09:35 PM

## 2011-05-21 ENCOUNTER — Encounter: Payer: Self-pay | Admitting: Podiatry

## 2011-08-07 ENCOUNTER — Other Ambulatory Visit (HOSPITAL_COMMUNITY): Payer: Self-pay | Admitting: Family Medicine

## 2011-08-07 DIAGNOSIS — Z139 Encounter for screening, unspecified: Secondary | ICD-10-CM

## 2011-08-08 LAB — BLOOD GAS, ARTERIAL
Acid-Base Excess: 1.1
FIO2: 0.21
O2 Saturation: 92.2
Patient temperature: 37
pCO2 arterial: 44.1

## 2011-08-14 LAB — BLOOD GAS, ARTERIAL
Acid-Base Excess: 3.1 — ABNORMAL HIGH
Drawn by: 244801
FIO2: 0.21
pCO2 arterial: 44.8
pH, Arterial: 7.405 — ABNORMAL HIGH
pO2, Arterial: 71.7 — ABNORMAL LOW

## 2011-08-14 LAB — CBC
HCT: 30 — ABNORMAL LOW
Hemoglobin: 9.7 — ABNORMAL LOW
MCHC: 31.2
MCHC: 32
MCHC: 32.1
MCHC: 32.2
MCHC: 32.4
MCV: 75.7 — ABNORMAL LOW
MCV: 76.9 — ABNORMAL LOW
MCV: 77 — ABNORMAL LOW
Platelets: 229
Platelets: 264
RBC: 3.79 — ABNORMAL LOW
RBC: 3.86 — ABNORMAL LOW
RBC: 3.97
RBC: 4.3
RDW: 15.8 — ABNORMAL HIGH
RDW: 16.3 — ABNORMAL HIGH
RDW: 16.5 — ABNORMAL HIGH
WBC: 6.4
WBC: 6.7
WBC: 7.1

## 2011-08-14 LAB — IRON AND TIBC
Iron: 107
TIBC: 492 — ABNORMAL HIGH

## 2011-08-14 LAB — COMPREHENSIVE METABOLIC PANEL
AST: 18
CO2: 24
CO2: 28
Calcium: 9.4
Chloride: 104
Creatinine, Ser: 1.73 — ABNORMAL HIGH
Creatinine, Ser: 1.8 — ABNORMAL HIGH
GFR calc Af Amer: 35 — ABNORMAL LOW
GFR calc non Af Amer: 28 — ABNORMAL LOW
GFR calc non Af Amer: 29 — ABNORMAL LOW
Glucose, Bld: 101 — ABNORMAL HIGH
Glucose, Bld: 102 — ABNORMAL HIGH
Total Bilirubin: 0.2 — ABNORMAL LOW
Total Bilirubin: 0.4

## 2011-08-14 LAB — BASIC METABOLIC PANEL
BUN: 41 — ABNORMAL HIGH
BUN: 45 — ABNORMAL HIGH
CO2: 27
CO2: 32
Calcium: 9.1
Calcium: 9.1
Calcium: 9.5
Chloride: 100
Creatinine, Ser: 1.24 — ABNORMAL HIGH
Creatinine, Ser: 1.4 — ABNORMAL HIGH
Creatinine, Ser: 1.47 — ABNORMAL HIGH
Creatinine, Ser: 1.54 — ABNORMAL HIGH
GFR calc Af Amer: 40 — ABNORMAL LOW
GFR calc Af Amer: 43 — ABNORMAL LOW
GFR calc Af Amer: 52 — ABNORMAL LOW
GFR calc non Af Amer: 35 — ABNORMAL LOW
Glucose, Bld: 122 — ABNORMAL HIGH
Glucose, Bld: 149 — ABNORMAL HIGH

## 2011-08-14 LAB — OCCULT BLOOD X 1 CARD TO LAB, STOOL: Fecal Occult Bld: POSITIVE

## 2011-08-14 LAB — LIPID PANEL
Cholesterol: 103
HDL: 25 — ABNORMAL LOW
LDL Cholesterol: 58
Total CHOL/HDL Ratio: 4.1
Triglycerides: 102

## 2011-08-14 LAB — POCT I-STAT 3, VENOUS BLOOD GAS (G3P V)
Acid-Base Excess: 4 — ABNORMAL HIGH
O2 Saturation: 86
O2 Saturation: 89
Operator id: 284701
Operator id: 284701
TCO2: 32
pCO2, Ven: 47.5
pH, Ven: 7.368 — ABNORMAL HIGH
pH, Ven: 7.397 — ABNORMAL HIGH

## 2011-08-14 LAB — CARDIAC PANEL(CRET KIN+CKTOT+MB+TROPI): Relative Index: 4.1 — ABNORMAL HIGH

## 2011-08-14 LAB — HEPARIN LEVEL (UNFRACTIONATED)
Heparin Unfractionated: 0.17 — ABNORMAL LOW
Heparin Unfractionated: 0.78 — ABNORMAL HIGH

## 2011-08-14 LAB — CK TOTAL AND CKMB (NOT AT ARMC): Total CK: 261 — ABNORMAL HIGH

## 2011-08-14 LAB — IRON: Iron: 56

## 2011-08-14 LAB — DIFFERENTIAL
Basophils Absolute: 0.1
Eosinophils Absolute: 0.2
Lymphocytes Relative: 31
Lymphs Abs: 2.1
Neutrophils Relative %: 55

## 2011-08-14 LAB — TROPONIN I: Troponin I: 0.01

## 2011-08-14 LAB — B-NATRIURETIC PEPTIDE (CONVERTED LAB): Pro B Natriuretic peptide (BNP): 150 — ABNORMAL HIGH

## 2011-08-14 LAB — APTT: aPTT: 42 — ABNORMAL HIGH

## 2011-08-14 LAB — PROTIME-INR: Prothrombin Time: 14

## 2011-08-14 LAB — MAGNESIUM: Magnesium: 2.3

## 2011-08-14 LAB — D-DIMER, QUANTITATIVE: D-Dimer, Quant: 0.5 — ABNORMAL HIGH

## 2011-08-20 ENCOUNTER — Encounter: Payer: Self-pay | Admitting: Gastroenterology

## 2011-08-30 ENCOUNTER — Ambulatory Visit: Payer: Medicare Other | Admitting: Gynecologic Oncology

## 2011-08-30 ENCOUNTER — Ambulatory Visit: Payer: Medicare Other | Attending: Gynecologic Oncology | Admitting: Gynecologic Oncology

## 2011-08-30 DIAGNOSIS — I10 Essential (primary) hypertension: Secondary | ICD-10-CM | POA: Insufficient documentation

## 2011-08-30 DIAGNOSIS — E119 Type 2 diabetes mellitus without complications: Secondary | ICD-10-CM | POA: Insufficient documentation

## 2011-08-30 DIAGNOSIS — Z9079 Acquired absence of other genital organ(s): Secondary | ICD-10-CM | POA: Insufficient documentation

## 2011-08-30 DIAGNOSIS — Z90710 Acquired absence of both cervix and uterus: Secondary | ICD-10-CM | POA: Insufficient documentation

## 2011-08-30 DIAGNOSIS — C549 Malignant neoplasm of corpus uteri, unspecified: Secondary | ICD-10-CM | POA: Insufficient documentation

## 2011-08-30 DIAGNOSIS — J438 Other emphysema: Secondary | ICD-10-CM | POA: Insufficient documentation

## 2011-08-30 DIAGNOSIS — N952 Postmenopausal atrophic vaginitis: Secondary | ICD-10-CM | POA: Insufficient documentation

## 2011-08-30 DIAGNOSIS — Z794 Long term (current) use of insulin: Secondary | ICD-10-CM | POA: Insufficient documentation

## 2011-08-30 DIAGNOSIS — I252 Old myocardial infarction: Secondary | ICD-10-CM | POA: Insufficient documentation

## 2011-08-31 NOTE — Consult Note (Signed)
NAMETAMANTHA, SALINE             ACCOUNT NO.:  [id number]  MEDICAL RECORD NO.:  [id number]  LOCATION:  GYN                          FACILITY:  [facility]  PHYSICIAN:  Laurette Schimke, MD     DATE OF BIRTH:  [birth year]  DATE OF CONSULTATION:  08/30/2011 DATE OF DISCHARGE:                                CONSULTATION   REASON FOR CONSULT:  Surveillance endometrial cancer and vaginal bleeding.  HISTORY OF PRESENT ILLNESS:  This is a 75 year old who underwent a total abdominal hysterectomy, bilateral salpingo-oophorectomy on June 02, 2010, for a stage IA grade 1 endometrial cancer.  Her postoperative course was complicated by a prolonged ileus with significant physical decompensation requiring post discharge care at a nursing facility.  Ms. Tercero presents today with complaints of vaginal bleeding for approximately 3 days last week.  She denies any changes in bowel or bladder habits.  Denies bleeding from the rectum or the bladder.  She reports worsening shortness of breath, consistent with her emphysema.  PAST MEDICAL HISTORY: 1. Insulin-dependent diabetes mellitus. 2. Emphysema/COPD. 3. Hypertension. 4. History of myocardial infarction.  PAST SURGICAL HISTORY:  Ectopic pregnancy in 1960s, back surgery 4 years ago, total abdominal hysterectomy, bilateral salpingo-oophorectomy in July 2011.  REVIEW OF SYSTEMS:  Vaginal spotting, vaginal dryness.  No weight loss. Reports good appetite.  Reports stable abdominal bloating with intermittent nausea, that is unchanged over a long period of time. Otherwise 10-point review of systems is noncontributory.  PHYSICAL EXAMINATION:  GENERAL:  Well-developed female, in no acute distress. VITAL SIGNS:  Weight 230.5 pounds, blood pressure 120/80. CHEST:  Clear to auscultation. HEART:  Regular rate and rhythm.  ABDOMEN:  Soft, obese.  Midline incision intact without any evidence of a hernia. BACK:  No CVA tenderness.  Normal external genitalia,  Bartholin, urethra, and Skene.  Vagina markedly atrophic with trauma to the vaginal walls with manipulation of the speculum.  No gross lesions. DIGITAL EXAMINATION:  There is no associated absence of any pelvic masses. RECTAL EXAMINATION:  Good anal sphincter tone without any masses.  IMPRESSION: 1. Stage IA endometrial carcinoma.  No evidence of disease. 2. Atrophic vaginitis.  Vagifem was prescribed for a 3 month course.     The patient was counseled there is an [employer] administrator and Drug Administration     contraindication for the use of estrogen in women with endometrial     cancer; however, the symptomatic atrophic vaginitis can only be     managed with vaginal estrogen.  A 62-month course     has been prescribed.  Ms. Armel has been informed that if the     bleeding is any worse, to inform either myself or Dr. Emelda Fear.     She will follow up with Dr. Emelda Fear in 3 months and followup with     Gynecology-Oncology in 6 months.     Laurette Schimke, MD     WB/MEDQ  D:  08/30/2011  T:  08/30/2011  Job:  [id number]  cc:   Telford Nab, R.N. [address] [place], [place] [address]  Nicki Guadalajara, M.D. Fax: [phone]  Tilda Burrow, M.D. Fax: [phone]  Annia Friendly. Loleta Chance, MD Fax: [phone]  Electronically Signed  by Laurette Schimke MD on 08/31/2011 12:06:22 PM

## 2011-09-10 ENCOUNTER — Ambulatory Visit (HOSPITAL_COMMUNITY): Payer: Medicare Other

## 2011-09-13 ENCOUNTER — Ambulatory Visit (HOSPITAL_COMMUNITY)
Admission: RE | Admit: 2011-09-13 | Discharge: 2011-09-13 | Disposition: A | Discharge status: Home / Self Care | Payer: Medicare Other | Source: Ambulatory Visit | Attending: Family Medicine | Admitting: Family Medicine

## 2011-09-13 DIAGNOSIS — Z1231 Encounter for screening mammogram for malignant neoplasm of breast: Secondary | ICD-10-CM | POA: Insufficient documentation

## 2011-09-13 DIAGNOSIS — Z139 Encounter for screening, unspecified: Secondary | ICD-10-CM

## 2011-09-26 ENCOUNTER — Encounter: Payer: Self-pay | Admitting: Internal Medicine

## 2011-09-27 ENCOUNTER — Ambulatory Visit (INDEPENDENT_AMBULATORY_CARE_PROVIDER_SITE_OTHER): Payer: Medicare Other | Admitting: Gastroenterology

## 2011-09-27 ENCOUNTER — Encounter: Payer: Self-pay | Admitting: Gastroenterology

## 2011-09-27 VITALS — BP 140/80 | HR 76 | Temp 97.6°F | Ht 67.0 in | Wt 238.6 lb

## 2011-09-27 DIAGNOSIS — Z86010 Personal history of colonic polyps: Secondary | ICD-10-CM

## 2011-09-27 NOTE — Assessment & Plan Note (Signed)
TCS DEC 2012 DUE TO CARDIOPULMONARY COMLICATIONS WITH MODERATE SEDATION. HOLD IRON FOR 7 DAYS. HOLD BYETTA AND GLIPIZIDE AM OF PROCEDURE. 1/2 HUMALOG NIGHT PRIOR AND HOLD AM OF OPV PRN

## 2011-09-27 NOTE — Progress Notes (Signed)
Subjective:    Patient ID: Krystal [name], female    DOB: [birth year], 75 y.o.   MRN: [id number]  PCP: [facility]  HPI Bms: q2-3days. EATS CERTAIN THINGS TO MAKE THEM MOVE. EATS GREENS. USES MIRALAX PRN. RARE RECTAL BLEEDING-LAST TIME NEVER IN LAST YEAR.   Past Medical History  Diagnosis Date  . Sleep apnea   . Occult blood in stools   . Diastolic dysfunction   . Myalgia   . Abnormal PFT   . DM (diabetes mellitus)     type II uncontrolled  . Palpitations   . Microalbuminuria   . Microcytic anemia   . DJD (degenerative joint disease)   . Constipation   . Abnormal heart rhythm   . CAD (coronary artery disease)   . Osteoarthritis   . MI (myocardial infarction)   . Low back pain   . HTN (hypertension)   . Hyperlipidemia   . GERD (gastroesophageal reflux disease)   . History of colonic polyps   . Allergic rhinitis   . Personal history of colonic polyps     Past Surgical History  Procedure Date  . Ectopic pregnancy surgery   . Foot surgery     left and right for callous  . Angiplasty 99/05/06/07    x 5  . Cervical discectomy     L5 left/hemilminectomy  . Coronary angioplasty 02/2008  . Colonoscopy 09/2006    int hemmorhoids, COMPLICATED BY CARDIOPULMONARY COMPLICATIONS    Allergies  Allergen Reactions  . Morphine     REACTION: swelling,SOB  . Penicillins     REACTION: swelling,SOB    Current Outpatient Prescriptions  Medication Sig Dispense Refill  . aspirin 81 MG EC tablet Take 81 mg by mouth daily.        . clopidogrel (PLAVIX) 75 MG tablet Take 75 mg by mouth daily.        Marland Kitchen exenatide (BYETTA 10 MCG PEN) 10 MCG/0.04ML SOLN Inject 10 mcg into the skin 2 (two) times daily with a meal.        . famotidine (PEPCID) 20 MG tablet Take 20 mg by mouth 2 (two) times daily.        . ferrous sulfate 325 (65 FE) MG tablet Take 325 mg by mouth daily.        . Fluticasone-Salmeterol (ADVAIR DISKUS) 250-50 MCG/DOSE AEPB Inhale 2 puffs into the lungs 2 (two) times daily.         . furosemide (LASIX) 40 MG tablet Take 40 mg by mouth daily.        Marland Kitchen glipiZIDE (GLUCOTROL) 10 MG 24 hr tablet Take 10 mg by mouth daily.        . Hydrocodone-APAP-Dietary Prod (HYDROCODONE-APAP-NUTRIT SUPP) 5-500 MG MISC Take by mouth as needed.        . insulin lispro protamine-insulin lispro (HUMALOG 75/25) (75-25) 100 UNIT/ML SUSP Inject into the skin.        Marland Kitchen loratadine (CLARITIN) 10 MG tablet Take 10 mg by mouth daily.        . Nebivolol HCl (BYSTOLIC PO) Take 30 mg by mouth daily.        . niacin (NIASPAN) 1000 MG CR tablet Take 1,000 mg by mouth daily.        . potassium chloride (KLOR-CON) 10 MEQ CR tablet Take 40 mEq by mouth daily.        . rosuvastatin (CRESTOR) 40 MG tablet Take 40 mg by mouth daily.        Marland Kitchen  budesonide-formoterol (SYMBICORT) 160-4.5 MCG/ACT inhaler Inhale into the lungs.        . isosorbide mononitrate (IMDUR) 60 MG 24 hr tablet Take 60 mg by mouth daily.            Review of Systems  All other systems reviewed and are negative.       Objective:   Physical Exam  Vitals reviewed. Constitutional: She is oriented to person, place, and time. She appears well-developed and well-nourished. No distress.  HENT:  Head: Normocephalic and atraumatic.  Eyes: Pupils are equal, round, and reactive to light.  Neck: Normal range of motion. Neck supple.  Cardiovascular: Normal rate, regular rhythm and normal heart sounds.   Pulmonary/Chest: Effort normal and breath sounds normal. No respiratory distress.  Abdominal: Soft. Bowel sounds are normal. She exhibits no distension. There is no tenderness.  Musculoskeletal: She exhibits edema (TRACE BIL LE).  Lymphadenopathy:    She has no cervical adenopathy.  Neurological: She is alert and oriented to person, place, and time.       NO FOCAL DEFICITS           Assessment & Plan:

## 2011-10-01 NOTE — Progress Notes (Signed)
Cc to PCP 

## 2011-10-09 ENCOUNTER — Encounter (HOSPITAL_COMMUNITY): Payer: Self-pay | Admitting: Pharmacy Technician

## 2011-10-15 ENCOUNTER — Encounter (HOSPITAL_COMMUNITY): Admission: RE | Admit: 2011-10-15 | Discharge: 2011-10-15 | Payer: Medicare Other | Source: Ambulatory Visit

## 2011-10-15 NOTE — Patient Instructions (Signed)
20 Krystal [name]  10/15/2011   Your procedure is scheduled on:  10/22/2011  Report to [facility] at 07:15 AM.  Call this number if you have problems the morning of surgery: [phone]   Remember:   Do not eat food:After Midnight.  May have clear liquids:until Midnight .  Clear liquids include soda, tea, black coffee, apple or grape juice, broth.  Take these medicines the morning of surgery with A SIP OF WATER: Pepcid, Hydrocodone, Imdur, Verapamil, Claritin, Bystolic, and Ranexa. Also, take your inhaler, Advair.   Do not wear jewelry, make-up or nail polish.  Do not wear lotions, powders, or perfumes. You may wear deodorant.  Do not shave 48 hours prior to surgery.  Do not bring valuables to the hospital.  Contacts, dentures or bridgework may not be worn into surgery.  Leave suitcase in the car. After surgery it may be brought to your room.  For patients admitted to the hospital, checkout time is 11:00 AM the day of discharge.   Patients discharged the day of surgery will not be allowed to drive home.  Name and phone number of your driver:   Special Instructions: N/A   Please read over the following fact sheets that you were given: Anesthesia Post-op Instructions     Colonoscopy A colonoscopy is an exam to evaluate your entire colon. In this exam, your colon is cleansed. A long fiberoptic tube is inserted through your rectum and into your colon. The fiberoptic scope (endoscope) is a long bundle of enclosed and very flexible fibers. These fibers transmit light to the area examined and send images from that area to your caregiver. Discomfort is usually minimal. You may be given a drug to help you sleep (sedative) during or prior to the procedure. This exam helps to detect lumps (tumors), polyps, inflammation, and areas of bleeding. Your caregiver may also take a small piece of tissue (biopsy) that will be examined under a microscope. LET YOUR CAREGIVER KNOW ABOUT:   Allergies to  food or medicine.   Medicines taken, including vitamins, herbs, eyedrops, over-the-counter medicines, and creams.   Use of steroids (by mouth or creams).   Previous problems with anesthetics or numbing medicines.   History of bleeding problems or blood clots.   Previous surgery.   Other health problems, including diabetes and kidney problems.   Possibility of pregnancy, if this applies.  BEFORE THE PROCEDURE   A clear liquid diet may be required for 2 days before the exam.   Ask your caregiver about changing or stopping your regular medications.   Liquid injections (enemas) or laxatives may be required.   A large amount of electrolyte solution may be given to you to drink over a short period of time. This solution is used to clean out your colon.   You should be present 60 minutes prior to your procedure or as directed by your caregiver.  AFTER THE PROCEDURE   If you received a sedative or pain relieving medication, you will need to arrange for someone to drive you home.   Occasionally, there is a little blood passed with the first bowel movement. Do not be concerned.  FINDING OUT THE RESULTS OF YOUR TEST Not all test results are available during your visit. If your test results are not back during the visit, make an appointment with your caregiver to find out the results. Do not assume everything is normal if you have not heard from your caregiver or the medical facility. It is  important for you to follow up on all of your test results. HOME CARE INSTRUCTIONS   It is not unusual to pass moderate amounts of gas and experience mild abdominal cramping following the procedure. This is due to air being used to inflate your colon during the exam. Walking or a warm pack on your belly (abdomen) may help.   You may resume all normal meals and activities after sedatives and medicines have worn off.   Only take over-the-counter or prescription medicines for pain, discomfort, or fever as  directed by your caregiver. Do not use aspirin or blood thinners if a biopsy was taken. Consult your caregiver for medicine usage if biopsies were taken.  SEEK IMMEDIATE MEDICAL CARE IF:   You have a fever.   You pass large blood clots or fill a toilet with blood following the procedure. This may also occur 10 to 14 days following the procedure. This is more likely if a biopsy was taken.   You develop abdominal pain that keeps getting worse and cannot be relieved with medicine.  Document Released: 11/02/2000 Document Revised: 07/18/2011 Document Reviewed: 06/17/2008 [facility] Patient Information 2012 [place], [place].      PATIENT INSTRUCTIONS POST-ANESTHESIA  IMMEDIATELY FOLLOWING SURGERY:  Do not drive or operate machinery for the first twenty four hours after surgery.  Do not make any important decisions for twenty four hours after surgery or while taking narcotic pain medications or sedatives.  If you develop intractable nausea and vomiting or a severe headache please notify your doctor immediately.  FOLLOW-UP:  Please make an appointment with your surgeon as instructed. You do not need to follow up with anesthesia unless specifically instructed to do so.  WOUND CARE INSTRUCTIONS (if applicable):  Keep a dry clean dressing on the anesthesia/puncture wound site if there is drainage.  Once the wound has quit draining you may leave it open to air.  Generally you should leave the bandage intact for twenty four hours unless there is drainage.  If the epidural site drains for more than 36-48 hours please call the anesthesia department.  QUESTIONS?:  Please feel free to call your physician or the hospital operator if you have any questions, and they will be happy to assist you.     [facility] Anesthesia Department [address] [place] [phone]

## 2011-10-19 ENCOUNTER — Encounter (HOSPITAL_COMMUNITY)
Admission: RE | Admit: 2011-10-19 | Discharge: 2011-10-19 | Disposition: A | Discharge status: Home / Self Care | Payer: Medicare Other | Source: Ambulatory Visit | Attending: Gastroenterology | Admitting: Gastroenterology

## 2011-10-19 ENCOUNTER — Encounter (HOSPITAL_COMMUNITY): Payer: Self-pay

## 2011-10-19 HISTORY — DX: Unspecified hearing loss, unspecified ear: H91.90

## 2011-10-19 LAB — BASIC METABOLIC PANEL
BUN: 17 mg/dL (ref 6–23)
Chloride: 99 mEq/L (ref 96–112)
GFR calc Af Amer: 37 mL/min — ABNORMAL LOW (ref >90)
GFR calc non Af Amer: 32 mL/min — ABNORMAL LOW (ref >90)
Potassium: 4.1 mEq/L (ref 3.5–5.1)
Sodium: 139 mEq/L (ref 135–145)

## 2011-10-19 LAB — CBC
HCT: 40.5 % (ref 36.0–46.0)
Hemoglobin: 12.7 g/dL (ref 12.0–15.0)
RDW: 15.4 % (ref 11.5–15.5)
WBC: 6.6 10*3/uL (ref 4.0–10.5)

## 2011-10-19 NOTE — Patient Instructions (Addendum)
20 Krystal [name]  10/19/2011   Your procedure is scheduled on:  10/22/2011  Report to [facility] at 07;15 AM.  Call this number if you have problems the morning of surgery: [phone]   Remember:   Do not eat food:After Midnight.  May have clear liquids:until Midnight .  Clear liquids include soda, tea, black coffee, apple or grape juice, broth.  Take these medicines the morning of surgery with A SIP OF WATER: Verapamil, pepcid, advair, vicodin if needed, imdur, nebivolol (bystolic), and ranexa. Please bring your inhaler with you.   Do not wear jewelry, make-up or nail polish.  Do not wear lotions, powders, or perfumes. You may wear deodorant.  Do not bring valuables to the hospital.  Contacts, dentures or bridgework may not be worn into surgery.  Patients discharged the day of surgery will not be allowed to drive home.  Name and phone number of your driver:   Special Instructions: N/A   Please read over the following fact sheets that you were given: Pain Booklet, Anesthesia Post-op Instructions and Care and Recovery After Surgery  Colonoscopy Care After Read the instructions outlined below and refer to this sheet in the next few weeks. These discharge instructions provide you with general information on caring for yourself after you leave the hospital. Your doctor may also give you specific instructions. While your treatment has been planned according to the most current medical practices available, unavoidable complications occasionally occur. If you have any problems or questions after discharge, call your doctor. HOME CARE INSTRUCTIONS ACTIVITY:  You may resume your regular activity, but move at a slower pace for the next 24 hours.   Take frequent rest periods for the next 24 hours.   Walking will help get rid of the air and reduce the bloated feeling in your belly (abdomen).   No driving for 24 hours (because of the medicine (anesthesia) used during the test).   You may  shower.   Do not sign any important legal documents or operate any machinery for 24 hours (because of the anesthesia used during the test).  NUTRITION:  Drink plenty of fluids.   You may resume your normal diet as instructed by your doctor.   Begin with a light meal and progress to your normal diet. Heavy or fried foods are harder to digest and may make you feel sick to your stomach (nauseated).   Avoid alcoholic beverages for 24 hours or as instructed.  MEDICATIONS:  You may resume your normal medications unless your doctor tells you otherwise.  WHAT TO EXPECT TODAY:  Some feelings of bloating in the abdomen.   Passage of more gas than usual.   Spotting of blood in your stool or on the toilet paper.  IF YOU HAD POLYPS REMOVED DURING THE COLONOSCOPY:  No aspirin products for 7 days or as instructed.   No alcohol for 7 days or as instructed.   Eat a soft diet for the next 24 hours.  FINDING OUT THE RESULTS OF YOUR TEST Not all test results are available during your visit. If your test results are not back during the visit, make an appointment with your caregiver to find out the results. Do not assume everything is normal if you have not heard from your caregiver or the medical facility. It is important for you to follow up on all of your test results.  SEEK IMMEDIATE MEDICAL CARE IF:  You have more than a spotting of blood in your stool.  Your belly is swollen (abdominal distention).   You are nauseated or vomiting.   You have a fever.   You have abdominal pain or discomfort that is severe or gets worse throughout the day.  Document Released: 06/19/2004 Document Revised: 07/18/2011 Document Reviewed: 06/17/2008 [facility] Patient Information 2012 [place], [place].Monitored Anesthesia Care (MAC)  MAC stands for monitored anesthesia care. MAC usually means a tube is not put in your trachea (windpipe). MAC may also be called moderate sedation. MAC usually involves giving  intravenous anesthetic drugs, oxygen, watching vital signs and standard patient monitoring procedures similar to those used during a general anesthetic. MAC can be done without going to the operating room. MAC is typically used for small procedures that cannot be done with only local anesthesia. MAC usually means lower doses of anesthetic drugs. The recovery period tends to be shorter. The drugs used cause a lower level of awareness. This means you are partially awake and your reflexes are intact. You may hear what is being said and feel some pressure, but should not feel pain. The drugs used may affect your ability to remember the procedure. If you have depressed consciousness and lose some protective reflexes, this is called deep sedation. If you become unconscious and fall completely asleep, this is general anesthesia. In both deep sedation and general anesthesia, the caregivers must make sure that your airway remains open. During MAC, the sedation-trained caregivers will:  Give medications which may include:   Sedatives.   Analgesics.   Hypnotics.   Other medications which are needed to keep you comfortable, safe and secure.   Give local anesthetic to numb the procedural site.   Monitor your level of consciousness.   Monitor your blood pressure.   Monitor your heart rate and rhythm.   Monitor your respirations and oxygen levels.   Monitor your airway.   Monitor your level of pain.   Evaluate and treat problems which may occur.  Document Released: 08/01/2005 Document Revised: 07/18/2011 Document Reviewed: 10/04/2009 [facility] Patient Information 2012 [place], [place].

## 2011-10-22 ENCOUNTER — Ambulatory Visit (HOSPITAL_COMMUNITY): Payer: Medicare Other | Admitting: Anesthesiology

## 2011-10-22 ENCOUNTER — Ambulatory Visit (HOSPITAL_COMMUNITY)
Admission: RE | Admit: 2011-10-22 | Discharge: 2011-10-22 | Disposition: A | Discharge status: Home / Self Care | Payer: Medicare Other | Source: Ambulatory Visit | Attending: Gastroenterology | Admitting: Gastroenterology

## 2011-10-22 ENCOUNTER — Encounter (HOSPITAL_COMMUNITY)
Admission: RE | Disposition: A | Discharge status: Home / Self Care | Payer: Self-pay | Source: Ambulatory Visit | Attending: Gastroenterology

## 2011-10-22 ENCOUNTER — Encounter (HOSPITAL_COMMUNITY): Payer: Self-pay | Admitting: Anesthesiology

## 2011-10-22 ENCOUNTER — Encounter (HOSPITAL_COMMUNITY): Payer: Self-pay

## 2011-10-22 ENCOUNTER — Other Ambulatory Visit: Payer: Self-pay | Admitting: Gastroenterology

## 2011-10-22 DIAGNOSIS — K648 Other hemorrhoids: Secondary | ICD-10-CM | POA: Insufficient documentation

## 2011-10-22 DIAGNOSIS — E119 Type 2 diabetes mellitus without complications: Secondary | ICD-10-CM | POA: Insufficient documentation

## 2011-10-22 DIAGNOSIS — Z7982 Long term (current) use of aspirin: Secondary | ICD-10-CM | POA: Insufficient documentation

## 2011-10-22 DIAGNOSIS — Z860100 Personal history of colon polyps, unspecified: Secondary | ICD-10-CM | POA: Insufficient documentation

## 2011-10-22 DIAGNOSIS — Z79899 Other long term (current) drug therapy: Secondary | ICD-10-CM | POA: Insufficient documentation

## 2011-10-22 DIAGNOSIS — D126 Benign neoplasm of colon, unspecified: Secondary | ICD-10-CM | POA: Insufficient documentation

## 2011-10-22 DIAGNOSIS — Z01812 Encounter for preprocedural laboratory examination: Secondary | ICD-10-CM | POA: Insufficient documentation

## 2011-10-22 DIAGNOSIS — Z1211 Encounter for screening for malignant neoplasm of colon: Secondary | ICD-10-CM

## 2011-10-22 DIAGNOSIS — Z794 Long term (current) use of insulin: Secondary | ICD-10-CM | POA: Insufficient documentation

## 2011-10-22 DIAGNOSIS — I10 Essential (primary) hypertension: Secondary | ICD-10-CM | POA: Insufficient documentation

## 2011-10-22 DIAGNOSIS — G4733 Obstructive sleep apnea (adult) (pediatric): Secondary | ICD-10-CM | POA: Insufficient documentation

## 2011-10-22 HISTORY — PX: POLYPECTOMY: SHX5525

## 2011-10-22 SURGERY — COLONOSCOPY WITH PROPOFOL
Anesthesia: Monitor Anesthesia Care

## 2011-10-22 MED ORDER — MIDAZOLAM HCL 2 MG/2ML IJ SOLN
INTRAMUSCULAR | Status: AC
  Filled 2011-10-22: qty 2

## 2011-10-22 MED ORDER — FENTANYL CITRATE 0.05 MG/ML IJ SOLN
INTRAMUSCULAR | Status: DC | PRN
  Administered 2011-10-22 (×2): 50 ug via INTRAVENOUS

## 2011-10-22 MED ORDER — WATER FOR IRRIGATION, STERILE IR SOLN
Status: DC | PRN
  Administered 2011-10-22: 1000 mL

## 2011-10-22 MED ORDER — FENTANYL CITRATE 0.05 MG/ML IJ SOLN
INTRAMUSCULAR | Status: AC
  Filled 2011-10-22: qty 2

## 2011-10-22 MED ORDER — MIDAZOLAM HCL 2 MG/2ML IJ SOLN
1.0000 mg | INTRAMUSCULAR | Status: DC | PRN
  Administered 2011-10-22 (×2): 2 mg via INTRAVENOUS

## 2011-10-22 MED ORDER — PROPOFOL 10 MG/ML IV EMUL
INTRAVENOUS | Status: DC | PRN
  Administered 2011-10-22: 75 ug/kg/min via INTRAVENOUS

## 2011-10-22 MED ORDER — GLYCOPYRROLATE 0.2 MG/ML IJ SOLN
INTRAMUSCULAR | Status: AC
  Filled 2011-10-22: qty 1

## 2011-10-22 MED ORDER — NEBIVOLOL HCL 10 MG PO TABS
10.0000 mg | ORAL_TABLET | Freq: Every day | ORAL | Status: DC
  Administered 2011-10-22: 10 mg via ORAL
  Filled 2011-10-22: qty 1

## 2011-10-22 MED ORDER — MIDAZOLAM HCL 2 MG/2ML IJ SOLN
INTRAMUSCULAR | Status: AC
  Administered 2011-10-22: 2 mg via INTRAVENOUS
  Filled 2011-10-22: qty 2

## 2011-10-22 MED ORDER — LIDOCAINE HCL (PF) 1 % IJ SOLN
INTRAMUSCULAR | Status: AC
  Filled 2011-10-22: qty 5

## 2011-10-22 MED ORDER — STERILE WATER FOR IRRIGATION IR SOLN
Status: DC | PRN
  Administered 2011-10-22: 09:00:00

## 2011-10-22 MED ORDER — LACTATED RINGERS IV SOLN
INTRAVENOUS | Status: DC
  Administered 2011-10-22: 08:00:00 via INTRAVENOUS

## 2011-10-22 MED ORDER — PROPOFOL 10 MG/ML IV EMUL
INTRAVENOUS | Status: AC
  Filled 2011-10-22: qty 40

## 2011-10-22 MED ORDER — GLYCOPYRROLATE 0.2 MG/ML IJ SOLN
0.2000 mg | Freq: Once | INTRAMUSCULAR | Status: AC | PRN
  Administered 2011-10-22: 0.2 mg via INTRAVENOUS

## 2011-10-22 SURGICAL SUPPLY — 21 items
ELECT REM PT RETURN 9FT ADLT (ELECTROSURGICAL)
ELECTRODE REM PT RTRN 9FT ADLT (ELECTROSURGICAL) IMPLANT
FCP BXJMBJMB 240X2.8X (CUTTING FORCEPS)
FLOOR PAD 36X40 (MISCELLANEOUS) ×3
FORCEPS BIOP RAD 4 LRG CAP 4 (CUTTING FORCEPS) ×3 IMPLANT
FORCEPS BIOP RJ4 240 W/NDL (CUTTING FORCEPS)
FORCEPS BXJMBJMB 240X2.8X (CUTTING FORCEPS) IMPLANT
INJECTOR/SNARE I SNARE (MISCELLANEOUS) IMPLANT
LUBRICANT JELLY 4.5OZ STERILE (MISCELLANEOUS) ×3 IMPLANT
MANIFOLD NEPTUNE II (INSTRUMENTS) ×3 IMPLANT
NEEDLE SCLEROTHERAPY 25GX240 (NEEDLE) IMPLANT
PAD FLOOR 36X40 (MISCELLANEOUS) ×2 IMPLANT
PROBE APC STR FIRE (PROBE) IMPLANT
PROBE INJECTION GOLD (MISCELLANEOUS)
PROBE INJECTION GOLD 7FR (MISCELLANEOUS) IMPLANT
SNARE ROTATE MED OVAL 20MM (MISCELLANEOUS) IMPLANT
SYR 50ML LL SCALE MARK (SYRINGE) ×3 IMPLANT
TRAP SPECIMEN MUCOUS 40CC (MISCELLANEOUS) IMPLANT
TUBING ENDO SMARTCAP PENTAX (MISCELLANEOUS) IMPLANT
TUBING IRRIGATION ENDOGATOR (MISCELLANEOUS) ×3 IMPLANT
WATER STERILE IRR 1000ML POUR (IV SOLUTION) ×6 IMPLANT

## 2011-10-22 NOTE — Anesthesia Postprocedure Evaluation (Signed)
  Anesthesia Post-op Note  Patient: Krystal [name]  Procedure(s) Performed:  COLONOSCOPY WITH PROPOFOL - in cecum at 0927 ; total withdrawal time 13 minutes; POLYPECTOMY - Descending Colon Polypectomy  Patient Location: PACU  Anesthesia Type: MAC  Level of Consciousness: awake, alert  and oriented  Airway and Oxygen Therapy: Patient Spontanous Breathing  Post-op Pain: none  Post-op Assessment: Post-op Vital signs reviewed, Patient's Cardiovascular Status Stable, Respiratory Function Stable and No signs of Nausea or vomiting  Post-op Vital Signs: Reviewed and stable  Complications: No apparent anesthesia complications

## 2011-10-22 NOTE — Transfer of Care (Signed)
Immediate Anesthesia Transfer of Care Note  Patient: Krystal [name]  Procedure(s) Performed:  COLONOSCOPY WITH PROPOFOL - in cecum at 0927 ; total withdrawal time 13 minutes; POLYPECTOMY - Descending Colon Polypectomy  Patient Location: PACU  Anesthesia Type: MAC  Level of Consciousness: awake, alert  and oriented  Airway & Oxygen Therapy: Patient Spontanous Breathing and Patient connected to face mask oxygen  Post-op Assessment: Report given to PACU RN  Post vital signs: Reviewed and stable  Complications: No apparent anesthesia complications

## 2011-10-22 NOTE — Anesthesia Preprocedure Evaluation (Addendum)
Anesthesia Evaluation  Patient identified by MRN, date of birth, ID band Patient awake    Reviewed: Allergy & Precautions, H&P , NPO status , Patient's Chart, lab work & pertinent test results, reviewed documented beta blocker date and time   History of Anesthesia Complications Negative for: history of anesthetic complications  Airway Mallampati: II      Dental  (+) Teeth Intact   Pulmonary sleep apnea and Continuous Positive Airway Pressure Ventilation , COPD oxygen dependent,    + decreased breath sounds      Cardiovascular hypertension, Pt. on medications + angina with exertion + CAD, + Past MI and + Cardiac Stents Regular Normal    Neuro/Psych  Neuromuscular disease    GI/Hepatic GERD-  Medicated and Poorly Controlled,  Endo/Other  Type 2, Oral Hypoglycemic Agents  Renal/GU      Musculoskeletal   Abdominal   Peds  Hematology   Anesthesia Other Findings   Reproductive/Obstetrics                          Anesthesia Physical Anesthesia Plan  ASA: III  Anesthesia Plan: MAC   Post-op Pain Management:    Induction: Intravenous  Airway Management Planned: Simple Face Mask  Additional Equipment:   Intra-op Plan:   Post-operative Plan:   Informed Consent: I have reviewed the patients History and Physical, chart, labs and discussed the procedure including the risks, benefits and alternatives for the proposed anesthesia with the patient or authorized representative who has indicated his/her understanding and acceptance.     Plan Discussed with:   Anesthesia Plan Comments:         Anesthesia Quick Evaluation

## 2011-10-22 NOTE — H&P (Signed)
Reason for Visit     Colonoscopy          Vitals - Last Recorded       BP  Pulse  Temp(Src)  Ht  Wt  BMI    140/80  76  97.6 F (36.4 C) (Temporal)  5\' 7"  (1.702 m)  238 lb 9.6 oz (108.228 kg)  37.37 kg/m2             Progress Notes     Jonette Eva, MD 09/27/2011 4:31 PM Signed    Subjective:    Patient ID: Krystal [name], female DOB: [birth year], 75 y.o. MRN: [id number]  PCP: [facility]  HPI  Bms: q2-3days. EATS CERTAIN THINGS TO MAKE THEM MOVE. EATS GREENS. USES MIRALAX PRN. RARE RECTAL BLEEDING-LAST TIME NEVER IN LAST YEAR.     Past Medical History     Diagnosis  Date     .  Sleep apnea      .  Occult blood in stools      .  Diastolic dysfunction      .  Myalgia      .  Abnormal PFT      .  DM (diabetes mellitus)        type II uncontrolled     .  Palpitations      .  Microalbuminuria      .  Microcytic anemia      .  DJD (degenerative joint disease)      .  Constipation      .  Abnormal heart rhythm      .  CAD (coronary artery disease)      .  Osteoarthritis      .  MI (myocardial infarction)      .  Low back pain      .  HTN (hypertension)      .  Hyperlipidemia      .  GERD (gastroesophageal reflux disease)      .  History of colonic polyps      .  Allergic rhinitis      .  Personal history of colonic polyps          Past Surgical History     Procedure  Date     .  Ectopic pregnancy surgery      .  Foot surgery        left and right for callous     .  Angiplasty  99/05/06/07       x 5     .  Cervical discectomy        L5 left/hemilminectomy     .  Coronary angioplasty  02/2008     .  Colonoscopy  09/2006       int hemmorhoids, COMPLICATED BY CARDIOPULMONARY COMPLICATIONS         Allergies     Allergen  Reactions     .  Morphine        REACTION: swelling,SOB     .  Penicillins        REACTION: swelling,SOB         Current Outpatient Prescriptions     Medication  Sig  Dispense  Refill     .  aspirin 81 MG EC tablet  Take 81 mg by mouth  daily.       .  clopidogrel (PLAVIX) 75 MG tablet  Take 75 mg by mouth daily.       Marland Kitchen  exenatide (BYETTA 10 MCG PEN) 10 MCG/0.04ML SOLN  Inject 10 mcg into the skin 2 (two) times daily with a meal.       .  famotidine (PEPCID) 20 MG tablet  Take 20 mg by mouth 2 (two) times daily.       .  ferrous sulfate 325 (65 FE) MG tablet  Take 325 mg by mouth daily.       .  Fluticasone-Salmeterol (ADVAIR DISKUS) 250-50 MCG/DOSE AEPB  Inhale 2 puffs into the lungs 2 (two) times daily.       .  furosemide (LASIX) 40 MG tablet  Take 40 mg by mouth daily.       Marland Kitchen  glipiZIDE (GLUCOTROL) 10 MG 24 hr tablet  Take 10 mg by mouth daily.       .  Hydrocodone-APAP-Dietary Prod (HYDROCODONE-APAP-NUTRIT SUPP) 5-500 MG MISC  Take by mouth as needed.       .  insulin lispro protamine-insulin lispro (HUMALOG 75/25) (75-25) 100 UNIT/ML SUSP  Inject into the skin.       Marland Kitchen  loratadine (CLARITIN) 10 MG tablet  Take 10 mg by mouth daily.       .  Nebivolol HCl (BYSTOLIC PO)  Take 30 mg by mouth daily.       .  niacin (NIASPAN) 1000 MG CR tablet  Take 1,000 mg by mouth daily.       .  potassium chloride (KLOR-CON) 10 MEQ CR tablet  Take 40 mEq by mouth daily.       .  rosuvastatin (CRESTOR) 40 MG tablet  Take 40 mg by mouth daily.       .  budesonide-formoterol (SYMBICORT) 160-4.5 MCG/ACT inhaler  Inhale into the lungs.       .  isosorbide mononitrate (IMDUR) 60 MG 24 hr tablet  Take 60 mg by mouth daily.        Review of Systems  All other systems reviewed and are negative.        Objective:      Physical Exam  Vitals reviewed.  Constitutional: She is oriented to person, place, and time. She appears well-developed and well-nourished. No distress.  HENT:  Head: Normocephalic and atraumatic.  Eyes: Pupils are equal, round, and reactive to light.  Neck: Normal range of motion. Neck supple.  Cardiovascular: Normal rate, regular rhythm and normal heart sounds.  Pulmonary/Chest: Effort normal and breath sounds normal. No  respiratory distress.  Abdominal: Soft. Bowel sounds are normal. She exhibits no distension. There is no tenderness.  Musculoskeletal: She exhibits edema (TRACE BIL LE).  Lymphadenopathy:  She has no cervical adenopathy.  Neurological: She is alert and oriented to person, place, and time.  NO FOCAL DEFICITS        Assessment & Plan:       Krystal [name] 10/01/2011 10:30 AM Signed  Cc to PCP      COLONIC POLYPS, HX OF - Jonette Eva, MD 09/27/2011 4:29 PM Signed  TCS DEC 2012 DUE TO CARDIOPULMONARY COMLICATIONS WITH MODERATE SEDATION.  HOLD IRON FOR 7 DAYS.  HOLD BYETTA AND GLIPIZIDE AM OF PROCEDURE.  1/2 HUMALOG NIGHT PRIOR AND HOLD AM OF  OPV PRN

## 2011-10-22 NOTE — Interval H&P Note (Signed)
History and Physical Interval Note:  10/22/2011 7:38 AM  Krystal [name]  has presented today for surgery, with the diagnosis of COLON POLYPS  The various methods of treatment have been discussed with the patient and family. After consideration of risks, benefits and other options for treatment, the patient has consented to  Procedure(s): COLONOSCOPY WITH PROPOFOL as a surgical intervention .  The patients' history has been reviewed, patient examined, no change in status, stable for surgery.  I have reviewed the patients' chart and labs.  Questions were answered to the patient's satisfaction.     [employer]

## 2011-10-25 ENCOUNTER — Encounter (HOSPITAL_COMMUNITY): Payer: Self-pay | Admitting: Gastroenterology

## 2011-10-26 ENCOUNTER — Telehealth: Payer: Self-pay | Admitting: Gastroenterology

## 2011-10-26 NOTE — Telephone Encounter (Signed)
Results Cc to PCP  

## 2011-10-26 NOTE — Telephone Encounter (Signed)
Please call pt. She had HYPERPLASTIC POLYPS removed from her colon. TCS in 10-15 years. High fiber diet.

## 2011-10-26 NOTE — Telephone Encounter (Signed)
Pt aware of results and I am going to mail a high fiber diet to her.

## 2011-11-01 NOTE — Telephone Encounter (Signed)
tcs in 10-15 years

## 2011-11-27 DIAGNOSIS — I270 Primary pulmonary hypertension: Secondary | ICD-10-CM | POA: Diagnosis not present

## 2011-11-27 DIAGNOSIS — G4730 Sleep apnea, unspecified: Secondary | ICD-10-CM | POA: Diagnosis not present

## 2011-11-27 DIAGNOSIS — J449 Chronic obstructive pulmonary disease, unspecified: Secondary | ICD-10-CM | POA: Diagnosis not present

## 2011-11-29 ENCOUNTER — Other Ambulatory Visit (HOSPITAL_COMMUNITY): Payer: Self-pay | Admitting: Pulmonary Disease

## 2011-11-29 ENCOUNTER — Ambulatory Visit (HOSPITAL_COMMUNITY)
Admission: RE | Admit: 2011-11-29 | Discharge: 2011-11-29 | Disposition: A | Discharge status: Home / Self Care | Payer: Medicare Other | Source: Ambulatory Visit | Attending: Pulmonary Disease | Admitting: Pulmonary Disease

## 2011-11-29 DIAGNOSIS — I517 Cardiomegaly: Secondary | ICD-10-CM | POA: Diagnosis not present

## 2011-11-29 DIAGNOSIS — R0602 Shortness of breath: Secondary | ICD-10-CM | POA: Insufficient documentation

## 2011-11-29 DIAGNOSIS — I739 Peripheral vascular disease, unspecified: Secondary | ICD-10-CM | POA: Diagnosis not present

## 2011-11-29 DIAGNOSIS — F17200 Nicotine dependence, unspecified, uncomplicated: Secondary | ICD-10-CM | POA: Diagnosis not present

## 2011-11-29 DIAGNOSIS — L608 Other nail disorders: Secondary | ICD-10-CM | POA: Diagnosis not present

## 2011-11-29 DIAGNOSIS — L84 Corns and callosities: Secondary | ICD-10-CM | POA: Diagnosis not present

## 2011-11-29 DIAGNOSIS — E1059 Type 1 diabetes mellitus with other circulatory complications: Secondary | ICD-10-CM | POA: Diagnosis not present

## 2011-12-10 DIAGNOSIS — I2510 Atherosclerotic heart disease of native coronary artery without angina pectoris: Secondary | ICD-10-CM | POA: Diagnosis not present

## 2011-12-10 DIAGNOSIS — I119 Hypertensive heart disease without heart failure: Secondary | ICD-10-CM | POA: Diagnosis not present

## 2011-12-10 DIAGNOSIS — G4733 Obstructive sleep apnea (adult) (pediatric): Secondary | ICD-10-CM | POA: Diagnosis not present

## 2011-12-10 DIAGNOSIS — E782 Mixed hyperlipidemia: Secondary | ICD-10-CM | POA: Diagnosis not present

## 2012-01-18 DIAGNOSIS — E119 Type 2 diabetes mellitus without complications: Secondary | ICD-10-CM | POA: Diagnosis not present

## 2012-01-25 DIAGNOSIS — R0609 Other forms of dyspnea: Secondary | ICD-10-CM | POA: Diagnosis not present

## 2012-01-25 DIAGNOSIS — E789 Disorder of lipoprotein metabolism, unspecified: Secondary | ICD-10-CM | POA: Diagnosis not present

## 2012-01-25 DIAGNOSIS — I10 Essential (primary) hypertension: Secondary | ICD-10-CM | POA: Diagnosis not present

## 2012-01-25 DIAGNOSIS — E119 Type 2 diabetes mellitus without complications: Secondary | ICD-10-CM | POA: Diagnosis not present

## 2012-01-30 DIAGNOSIS — E785 Hyperlipidemia, unspecified: Secondary | ICD-10-CM | POA: Diagnosis not present

## 2012-01-30 DIAGNOSIS — J449 Chronic obstructive pulmonary disease, unspecified: Secondary | ICD-10-CM | POA: Diagnosis not present

## 2012-01-30 DIAGNOSIS — I10 Essential (primary) hypertension: Secondary | ICD-10-CM | POA: Diagnosis not present

## 2012-02-05 DIAGNOSIS — Z131 Encounter for screening for diabetes mellitus: Secondary | ICD-10-CM | POA: Diagnosis not present

## 2012-02-05 DIAGNOSIS — Z1389 Encounter for screening for other disorder: Secondary | ICD-10-CM | POA: Diagnosis not present

## 2012-02-05 DIAGNOSIS — N949 Unspecified condition associated with female genital organs and menstrual cycle: Secondary | ICD-10-CM | POA: Diagnosis not present

## 2012-02-05 DIAGNOSIS — Z8542 Personal history of malignant neoplasm of other parts of uterus: Secondary | ICD-10-CM | POA: Diagnosis not present

## 2012-02-25 DIAGNOSIS — E1059 Type 1 diabetes mellitus with other circulatory complications: Secondary | ICD-10-CM | POA: Diagnosis not present

## 2012-02-25 DIAGNOSIS — L608 Other nail disorders: Secondary | ICD-10-CM | POA: Diagnosis not present

## 2012-02-25 DIAGNOSIS — I10 Essential (primary) hypertension: Secondary | ICD-10-CM | POA: Diagnosis not present

## 2012-02-25 DIAGNOSIS — N390 Urinary tract infection, site not specified: Secondary | ICD-10-CM | POA: Diagnosis not present

## 2012-02-25 DIAGNOSIS — L84 Corns and callosities: Secondary | ICD-10-CM | POA: Diagnosis not present

## 2012-02-25 DIAGNOSIS — I739 Peripheral vascular disease, unspecified: Secondary | ICD-10-CM | POA: Diagnosis not present

## 2012-03-26 DIAGNOSIS — N2581 Secondary hyperparathyroidism of renal origin: Secondary | ICD-10-CM | POA: Diagnosis not present

## 2012-03-26 DIAGNOSIS — J449 Chronic obstructive pulmonary disease, unspecified: Secondary | ICD-10-CM | POA: Diagnosis not present

## 2012-03-26 DIAGNOSIS — I10 Essential (primary) hypertension: Secondary | ICD-10-CM | POA: Diagnosis not present

## 2012-03-26 DIAGNOSIS — E119 Type 2 diabetes mellitus without complications: Secondary | ICD-10-CM | POA: Diagnosis not present

## 2012-04-10 DIAGNOSIS — I10 Essential (primary) hypertension: Secondary | ICD-10-CM | POA: Diagnosis not present

## 2012-04-10 DIAGNOSIS — E1129 Type 2 diabetes mellitus with other diabetic kidney complication: Secondary | ICD-10-CM | POA: Diagnosis not present

## 2012-04-10 DIAGNOSIS — R609 Edema, unspecified: Secondary | ICD-10-CM | POA: Diagnosis not present

## 2012-04-23 DIAGNOSIS — G4733 Obstructive sleep apnea (adult) (pediatric): Secondary | ICD-10-CM | POA: Diagnosis not present

## 2012-04-23 DIAGNOSIS — E785 Hyperlipidemia, unspecified: Secondary | ICD-10-CM | POA: Diagnosis not present

## 2012-04-23 DIAGNOSIS — I119 Hypertensive heart disease without heart failure: Secondary | ICD-10-CM | POA: Diagnosis not present

## 2012-05-15 DIAGNOSIS — L608 Other nail disorders: Secondary | ICD-10-CM | POA: Diagnosis not present

## 2012-05-15 DIAGNOSIS — I739 Peripheral vascular disease, unspecified: Secondary | ICD-10-CM | POA: Diagnosis not present

## 2012-05-15 DIAGNOSIS — E1059 Type 1 diabetes mellitus with other circulatory complications: Secondary | ICD-10-CM | POA: Diagnosis not present

## 2012-05-15 DIAGNOSIS — L84 Corns and callosities: Secondary | ICD-10-CM | POA: Diagnosis not present

## 2012-05-19 DIAGNOSIS — I10 Essential (primary) hypertension: Secondary | ICD-10-CM | POA: Diagnosis not present

## 2012-05-19 DIAGNOSIS — E109 Type 1 diabetes mellitus without complications: Secondary | ICD-10-CM | POA: Diagnosis not present

## 2012-05-19 DIAGNOSIS — J449 Chronic obstructive pulmonary disease, unspecified: Secondary | ICD-10-CM | POA: Diagnosis not present

## 2012-05-19 DIAGNOSIS — G4730 Sleep apnea, unspecified: Secondary | ICD-10-CM | POA: Diagnosis not present

## 2012-07-08 DIAGNOSIS — I129 Hypertensive chronic kidney disease with stage 1 through stage 4 chronic kidney disease, or unspecified chronic kidney disease: Secondary | ICD-10-CM | POA: Diagnosis not present

## 2012-07-08 DIAGNOSIS — E119 Type 2 diabetes mellitus without complications: Secondary | ICD-10-CM | POA: Diagnosis not present

## 2012-07-08 DIAGNOSIS — I10 Essential (primary) hypertension: Secondary | ICD-10-CM | POA: Diagnosis not present

## 2012-07-09 ENCOUNTER — Encounter: Payer: Self-pay | Admitting: Gastroenterology

## 2012-07-10 ENCOUNTER — Encounter: Payer: Self-pay | Admitting: Gastroenterology

## 2012-07-10 ENCOUNTER — Other Ambulatory Visit: Payer: Self-pay | Admitting: Obstetrics and Gynecology

## 2012-07-10 ENCOUNTER — Other Ambulatory Visit (HOSPITAL_COMMUNITY)
Admission: RE | Admit: 2012-07-10 | Discharge: 2012-07-10 | Disposition: A | Discharge status: Home / Self Care | Payer: Medicare Other | Source: Ambulatory Visit | Attending: Obstetrics and Gynecology | Admitting: Obstetrics and Gynecology

## 2012-07-10 ENCOUNTER — Ambulatory Visit (INDEPENDENT_AMBULATORY_CARE_PROVIDER_SITE_OTHER): Payer: Medicare Other | Admitting: Gastroenterology

## 2012-07-10 VITALS — BP 138/63 | HR 84 | Temp 97.6°F | Ht 67.0 in | Wt 226.6 lb

## 2012-07-10 DIAGNOSIS — K648 Other hemorrhoids: Secondary | ICD-10-CM | POA: Insufficient documentation

## 2012-07-10 DIAGNOSIS — Z124 Encounter for screening for malignant neoplasm of cervix: Secondary | ICD-10-CM | POA: Diagnosis not present

## 2012-07-10 DIAGNOSIS — Z1272 Encounter for screening for malignant neoplasm of vagina: Secondary | ICD-10-CM | POA: Diagnosis not present

## 2012-07-10 DIAGNOSIS — Z1212 Encounter for screening for malignant neoplasm of rectum: Secondary | ICD-10-CM | POA: Diagnosis not present

## 2012-07-10 DIAGNOSIS — Z8542 Personal history of malignant neoplasm of other parts of uterus: Secondary | ICD-10-CM | POA: Diagnosis not present

## 2012-07-10 NOTE — Progress Notes (Signed)
Subjective:    Patient ID: Krystal [name], female    DOB: [birth year], 76 y.o.   MRN: [id number]  Pcp: [facility]  HPI LAST SEEN DEC 2012: TCS-LARGE IH. HAD CONSTIPATION 2 WEEKS AGO. TOOK 2 CAPS OF MIRALAX. CONSTIPATION ASSOCIATED WITH BLOATING AND ABD PAIN. TWO NEW MEDS SINCE LAST SEEN. NO PAIN OR PRESSURE IN HER RECTUM. DRINKS WATER. EATS PEACHES, PEARS, STRAWBERRIES. BLOATING PAIN BETTER AFTER HER BM. RECTAL BLEEDING SUN WIPED AND SAW IT THEN WIPED AGAIN AND JUST A LITTLE SPOT(2).   Past Medical History  Diagnosis Date  . Sleep apnea   . Occult blood in stools   . Diastolic dysfunction   . Myalgia   . Abnormal PFT   . DM (diabetes mellitus)     type II uncontrolled  . Palpitations   . Microalbuminuria   . Microcytic anemia   . DJD (degenerative joint disease)   . Constipation   . Abnormal heart rhythm   . CAD (coronary artery disease)   . Osteoarthritis   . Low back pain   . HTN (hypertension)   . Hyperlipidemia   . GERD (gastroesophageal reflux disease)   . History of colonic polyps   . Allergic rhinitis   . Personal history of colonic polyps   . HOH (hard of hearing)   . MI (myocardial infarction) 98  . Glaucoma   . Cancer     uterine    Past Surgical History  Procedure Date  . Ectopic pregnancy surgery   . Foot surgery     left and right for callous  . Angiplasty 99/05/06/07    x 5  . Cervical discectomy     L5 left/hemilminectomy  . Colonoscopy 09/2006    int hemmorhoids, COMPLICATED BY CARDIOPULMONARY COMPLICATIONS  . Coronary angioplasty 02/2008    5 cardiac stents total  . Abdominal hysterectomy     uterine cancer  . Polypectomy 10/22/2011    internal hemorrhoids/sessile polyp    Allergies  Allergen Reactions  . Morphine     REACTION: swelling,SOB  . Penicillins     REACTION: swelling,SOB   Current Outpatient Prescriptions  Medication Sig Dispense Refill  . aspirin 81 MG EC tablet Take 81 mg by mouth daily.        . clopidogrel (PLAVIX) 75 MG  tablet Take 75 mg by mouth daily.        Marland Kitchen exenatide (BYETTA 10 MCG PEN) 10 MCG/0.04ML SOLN Inject 10 mcg into the skin 2 (two) times daily with a meal.        . ezetimibe (ZETIA) 10 MG tablet Take 10 mg by mouth daily.        . famotidine (PEPCID) 20 MG tablet Take 20 mg by mouth daily.       . ferrous sulfate 325 (65 FE) MG tablet Take 325 mg by mouth daily.   NONE FOR 3 WEEKS      . Fluticasone-Salmeterol (ADVAIR DISKUS) 250-50 MCG/DOSE AEPB Inhale 1 puff into the lungs 2 (two) times daily.       . furosemide (LASIX) 40 MG tablet Take 40 mg by mouth daily.        Marland Kitchen glipiZIDE (GLUCOTROL) 10 MG 24 hr tablet Take 10 mg by mouth daily.        Marland Kitchen HYDROcodone-acetaminophen (VICODIN) 5-500 MG per tablet Take 500 mg by mouth Every 6 hours as needed. For pain      . Hydrocodone-APAP-Dietary Prod (HYDROCODONE-APAP-NUTRIT SUPP) 5-500 MG MISC Take by mouth as  needed.        . insulin lispro protamine-insulin lispro (HUMALOG 75/25) (75-25) 100 UNIT/ML SUSP Inject 80 Units into the skin 2 (two) times daily.       . isosorbide mononitrate (IMDUR) 60 MG 24 hr tablet Take 60 mg by mouth daily.       Marland Kitchen loratadine (CLARITIN) 10 MG tablet Take 10 mg by mouth daily.        . nebivolol (BYSTOLIC) 10 MG tablet Take 10 mg by mouth daily. Along with 20 mg      . Nebivolol HCl 20 MG TABS Take 20 mg by mouth daily. Along with 10 mg      . niacin (NIASPAN) 1000 MG CR tablet Take 1,000 mg by mouth daily.        Marland Kitchen NITROSTAT 0.4 MG SL tablet Take 1 tablet by mouth every 5 (five) minutes x 3 doses as needed. For chest pains      . polyethylene glycol powder (GLYCOLAX/MIRALAX) powder Take 1 packet by mouth Every 48 hours.      . potassium chloride (KLOR-CON) 10 MEQ CR tablet Take 40 mEq by mouth daily.        . ranolazine (RANEXA) 500 MG 12 hr tablet Take 500 mg by mouth daily.        . rosuvastatin (CRESTOR) 40 MG tablet Take 40 mg by mouth daily.        Marland Kitchen torsemide (DEMADEX) 20 MG tablet Take 20 mg by mouth Daily.      .  verapamil (CALAN-SR) 120 MG CR tablet Take 1 tablet by mouth Daily.         Review of Systems     Objective:   Physical Exam  Vitals reviewed. Constitutional: She is oriented to person, place, and time. She appears well-nourished. No distress.       OXYGEN IN PLACE  HENT:  Head: Normocephalic and atraumatic.  Mouth/Throat: Oropharynx is clear and moist. No oropharyngeal exudate.  Eyes: Pupils are equal, round, and reactive to light. No scleral icterus.  Neck: Normal range of motion. Neck supple.  Cardiovascular: Normal rate, regular rhythm and normal heart sounds.   Pulmonary/Chest: Effort normal and breath sounds normal. No respiratory distress.  Abdominal: Soft. Bowel sounds are normal. She exhibits no distension. There is no tenderness.  Neurological: She is alert and oriented to person, place, and time.       NO FOCAL DEFICITS   Psychiatric: She has a normal mood and affect.          Assessment & Plan:

## 2012-07-10 NOTE — Progress Notes (Signed)
Faxed to PCP

## 2012-07-10 NOTE — Patient Instructions (Signed)
YOUR RECTAL BLEEDING IS DUE TO HEMORRHOIDS. HEMORRHOIDS FLARE IF YOU HAVE CONSTIPATION.   USE MIRALAX EVERY MONWEDFRI. INCREASE TO M-F IF YOUR CONSTIPATION DOES NOT RESOLVE.   USE PREPARATION H AS NEEDED FOR RECTAL BLEEDING OR PAIN.  CONTINUE A HIGH FIBER DIET. SEE INFO BELOW.  DRINK WATER TO KEEP URINE LIGHT YELLOW.    FOLLOW UP IN 3 MOS. WE WILL DISCUSS [facility] AT THAT TIME.    Hemorrhoids Hemorrhoids are dilated (enlarged) veins around the rectum. Sometimes clots will form in the veins. This makes them swollen and painful. These are called thrombosed hemorrhoids.  Causes of hemorrhoids include:    Constipation.   Straining to have a bowel movement.      HOME CARE INSTRUCTIONS  Eat a well balanced diet and drink 6 to 8 glasses of water every day to avoid constipation. You may also use a bulk laxative.   Avoid straining to have bowel movements.   Keep anal area dry and clean.   If thrombosed:  Take hot sitz baths for 20 to 30 minutes, 3 to 4 times per day.   If the hemorrhoids are very tender and swollen, place ice packs on area as tolerated. Using ice packs between sitz baths may be helpful. Fill a plastic bag with ice and use a towel between the bag of ice and your skin.   Do not use a donut shaped pillow or sit on the toilet for long periods. This increases blood pooling and pain.   Move your bowels when your body has the urge; this will require less straining and will decrease pain and pressure.    High-Fiber Diet A high-fiber diet changes your normal diet to include more whole grains, legumes, fruits, and vegetables. Changes in the diet involve replacing refined carbohydrates with unrefined foods. The calorie level of the diet is essentially unchanged. The Dietary Reference Intake (recommended amount) for adult males is 38 grams per day. For adult females, it is 25 grams per day. Pregnant and lactating women should consume 28 grams of fiber per  day. Fiber is the intact part of a plant that is not broken down during digestion. Functional fiber is fiber that has been isolated from the plant to provide a beneficial effect in the body. PURPOSE  Increase stool bulk.   Ease and regulate bowel movements.   Lower cholesterol.  INDICATIONS THAT YOU NEED MORE FIBER  Constipation and hemorrhoids.   Uncomplicated diverticulosis (intestine condition) and irritable bowel syndrome.   Weight management.   As a protective measure against hardening of the arteries (atherosclerosis), diabetes, and cancer.   GUIDELINES FOR INCREASING FIBER IN THE DIET  Start adding fiber to the diet slowly. A gradual increase of about 5 more grams (2 slices of whole-wheat bread, 2 servings of most fruits or vegetables, or 1 bowl of high-fiber cereal) per day is best. Too rapid an increase in fiber may result in constipation, flatulence, and bloating.   Drink enough water and fluids to keep your urine clear or pale yellow. Water, juice, or caffeine-free drinks are recommended. Not drinking enough fluid may cause constipation.   Eat a variety of high-fiber foods rather than one type of fiber.   Try to increase your intake of fiber through using high-fiber foods rather than fiber pills or supplements that contain small amounts of fiber.   The goal is to change the types of food eaten. Do not supplement your present diet with high-fiber foods, but replace foods in  your present diet.  INCLUDE A VARIETY OF FIBER SOURCES  Replace refined and processed grains with whole grains, canned fruits with fresh fruits, and incorporate other fiber sources. White rice, white breads, and most bakery goods contain little or no fiber.   Brown whole-grain rice, buckwheat oats, and many fruits and vegetables are all good sources of fiber. These include: broccoli, Brussels sprouts, cabbage, cauliflower, beets, sweet potatoes, white potatoes (skin on), carrots, tomatoes, eggplant,  squash, berries, fresh fruits, and dried fruits.   Cereals appear to be the richest source of fiber. Cereal fiber is found in whole grains and bran. Bran is the fiber-rich outer coat of cereal grain, which is largely removed in refining. In whole-grain cereals, the bran remains. In breakfast cereals, the largest amount of fiber is found in those with "bran" in their names. The fiber content is sometimes indicated on the label.   You may need to include additional fruits and vegetables each day.   In baking, for 1 cup white flour, you may use the following substitutions:   1 cup whole-wheat flour minus 2 tablespoons.   1/2 cup white flour plus 1/2 cup whole-wheat flour.

## 2012-07-10 NOTE — Assessment & Plan Note (Deleted)
FLARE DUE TO CONSTIPATION.  USE MIRALAX EVERY MONWEDFRI. INCREASE TO M-F IF CONSTIPATION NOT RESOLVED.  FOLLOW UP IN 3 MOS. IF BLEEDING CONTINUES, COULD CONSIDER [facility].

## 2012-07-16 NOTE — Progress Notes (Signed)
Jun 30 2012: CR 1.43 K 4.8 GLU 239

## 2012-07-28 DIAGNOSIS — E1059 Type 1 diabetes mellitus with other circulatory complications: Secondary | ICD-10-CM | POA: Diagnosis not present

## 2012-07-28 DIAGNOSIS — L84 Corns and callosities: Secondary | ICD-10-CM | POA: Diagnosis not present

## 2012-07-28 DIAGNOSIS — I739 Peripheral vascular disease, unspecified: Secondary | ICD-10-CM | POA: Diagnosis not present

## 2012-08-04 ENCOUNTER — Other Ambulatory Visit (HOSPITAL_COMMUNITY): Payer: Self-pay | Admitting: Family Medicine

## 2012-08-04 DIAGNOSIS — Z1211 Encounter for screening for malignant neoplasm of colon: Secondary | ICD-10-CM

## 2012-08-20 DIAGNOSIS — Z8542 Personal history of malignant neoplasm of other parts of uterus: Secondary | ICD-10-CM | POA: Diagnosis not present

## 2012-08-20 DIAGNOSIS — N952 Postmenopausal atrophic vaginitis: Secondary | ICD-10-CM | POA: Diagnosis not present

## 2012-09-09 DIAGNOSIS — I2510 Atherosclerotic heart disease of native coronary artery without angina pectoris: Secondary | ICD-10-CM | POA: Diagnosis not present

## 2012-09-09 DIAGNOSIS — E119 Type 2 diabetes mellitus without complications: Secondary | ICD-10-CM | POA: Diagnosis not present

## 2012-09-09 DIAGNOSIS — F411 Generalized anxiety disorder: Secondary | ICD-10-CM | POA: Diagnosis not present

## 2012-09-09 DIAGNOSIS — Z23 Encounter for immunization: Secondary | ICD-10-CM | POA: Diagnosis not present

## 2012-09-09 DIAGNOSIS — R079 Chest pain, unspecified: Secondary | ICD-10-CM | POA: Diagnosis not present

## 2012-09-11 ENCOUNTER — Inpatient Hospital Stay (HOSPITAL_COMMUNITY)
Admission: AD | Admit: 2012-09-11 | Discharge: 2012-09-19 | DRG: 286 | Disposition: A | Discharge status: Home Health | Payer: Medicare Other | Source: Ambulatory Visit | Attending: Cardiovascular Disease | Admitting: Cardiovascular Disease

## 2012-09-11 DIAGNOSIS — Y92009 Unspecified place in unspecified non-institutional (private) residence as the place of occurrence of the external cause: Secondary | ICD-10-CM

## 2012-09-11 DIAGNOSIS — I2510 Atherosclerotic heart disease of native coronary artery without angina pectoris: Principal | ICD-10-CM | POA: Diagnosis present

## 2012-09-11 DIAGNOSIS — H9190 Unspecified hearing loss, unspecified ear: Secondary | ICD-10-CM | POA: Diagnosis present

## 2012-09-11 DIAGNOSIS — Z87891 Personal history of nicotine dependence: Secondary | ICD-10-CM

## 2012-09-11 DIAGNOSIS — I472 Ventricular tachycardia, unspecified: Secondary | ICD-10-CM | POA: Diagnosis not present

## 2012-09-11 DIAGNOSIS — I200 Unstable angina: Secondary | ICD-10-CM | POA: Diagnosis present

## 2012-09-11 DIAGNOSIS — Y840 Cardiac catheterization as the cause of abnormal reaction of the patient, or of later complication, without mention of misadventure at the time of the procedure: Secondary | ICD-10-CM | POA: Diagnosis not present

## 2012-09-11 DIAGNOSIS — E119 Type 2 diabetes mellitus without complications: Secondary | ICD-10-CM | POA: Diagnosis present

## 2012-09-11 DIAGNOSIS — H409 Unspecified glaucoma: Secondary | ICD-10-CM | POA: Diagnosis present

## 2012-09-11 DIAGNOSIS — I519 Heart disease, unspecified: Secondary | ICD-10-CM | POA: Diagnosis present

## 2012-09-11 DIAGNOSIS — I129 Hypertensive chronic kidney disease with stage 1 through stage 4 chronic kidney disease, or unspecified chronic kidney disease: Secondary | ICD-10-CM | POA: Diagnosis present

## 2012-09-11 DIAGNOSIS — G4730 Sleep apnea, unspecified: Secondary | ICD-10-CM | POA: Diagnosis present

## 2012-09-11 DIAGNOSIS — R6889 Other general symptoms and signs: Secondary | ICD-10-CM | POA: Diagnosis not present

## 2012-09-11 DIAGNOSIS — Z860100 Personal history of colon polyps, unspecified: Secondary | ICD-10-CM

## 2012-09-11 DIAGNOSIS — N183 Chronic kidney disease, stage 3 unspecified: Secondary | ICD-10-CM | POA: Diagnosis present

## 2012-09-11 DIAGNOSIS — Z6835 Body mass index (BMI) 35.0-35.9, adult: Secondary | ICD-10-CM

## 2012-09-11 DIAGNOSIS — Z880 Allergy status to penicillin: Secondary | ICD-10-CM

## 2012-09-11 DIAGNOSIS — Z794 Long term (current) use of insulin: Secondary | ICD-10-CM

## 2012-09-11 DIAGNOSIS — Z7982 Long term (current) use of aspirin: Secondary | ICD-10-CM

## 2012-09-11 DIAGNOSIS — Z79899 Other long term (current) drug therapy: Secondary | ICD-10-CM

## 2012-09-11 DIAGNOSIS — IMO0002 Reserved for concepts with insufficient information to code with codable children: Secondary | ICD-10-CM | POA: Diagnosis not present

## 2012-09-11 DIAGNOSIS — I10 Essential (primary) hypertension: Secondary | ICD-10-CM | POA: Diagnosis not present

## 2012-09-11 DIAGNOSIS — N9989 Other postprocedural complications and disorders of genitourinary system: Secondary | ICD-10-CM | POA: Diagnosis not present

## 2012-09-11 DIAGNOSIS — Z888 Allergy status to other drugs, medicaments and biological substances status: Secondary | ICD-10-CM

## 2012-09-11 DIAGNOSIS — Z7902 Long term (current) use of antithrombotics/antiplatelets: Secondary | ICD-10-CM

## 2012-09-11 DIAGNOSIS — N179 Acute kidney failure, unspecified: Secondary | ICD-10-CM | POA: Diagnosis not present

## 2012-09-11 DIAGNOSIS — N990 Postprocedural (acute) (chronic) kidney failure: Secondary | ICD-10-CM | POA: Diagnosis not present

## 2012-09-11 DIAGNOSIS — E782 Mixed hyperlipidemia: Secondary | ICD-10-CM | POA: Diagnosis not present

## 2012-09-11 DIAGNOSIS — I2789 Other specified pulmonary heart diseases: Secondary | ICD-10-CM | POA: Diagnosis present

## 2012-09-11 DIAGNOSIS — E875 Hyperkalemia: Secondary | ICD-10-CM | POA: Diagnosis present

## 2012-09-11 DIAGNOSIS — I5033 Acute on chronic diastolic (congestive) heart failure: Secondary | ICD-10-CM | POA: Diagnosis not present

## 2012-09-11 DIAGNOSIS — Z8542 Personal history of malignant neoplasm of other parts of uterus: Secondary | ICD-10-CM

## 2012-09-11 DIAGNOSIS — J4489 Other specified chronic obstructive pulmonary disease: Secondary | ICD-10-CM | POA: Diagnosis present

## 2012-09-11 DIAGNOSIS — Y9210 Unspecified residential institution as the place of occurrence of the external cause: Secondary | ICD-10-CM | POA: Diagnosis not present

## 2012-09-11 DIAGNOSIS — G4733 Obstructive sleep apnea (adult) (pediatric): Secondary | ICD-10-CM | POA: Diagnosis present

## 2012-09-11 DIAGNOSIS — E785 Hyperlipidemia, unspecified: Secondary | ICD-10-CM | POA: Diagnosis present

## 2012-09-11 DIAGNOSIS — I252 Old myocardial infarction: Secondary | ICD-10-CM

## 2012-09-11 DIAGNOSIS — E669 Obesity, unspecified: Secondary | ICD-10-CM | POA: Diagnosis present

## 2012-09-11 DIAGNOSIS — I509 Heart failure, unspecified: Secondary | ICD-10-CM | POA: Diagnosis present

## 2012-09-11 DIAGNOSIS — I4729 Other ventricular tachycardia: Secondary | ICD-10-CM | POA: Diagnosis not present

## 2012-09-11 DIAGNOSIS — R079 Chest pain, unspecified: Secondary | ICD-10-CM | POA: Diagnosis not present

## 2012-09-11 DIAGNOSIS — J42 Unspecified chronic bronchitis: Secondary | ICD-10-CM | POA: Diagnosis present

## 2012-09-11 DIAGNOSIS — J449 Chronic obstructive pulmonary disease, unspecified: Secondary | ICD-10-CM | POA: Diagnosis present

## 2012-09-11 DIAGNOSIS — Z9861 Coronary angioplasty status: Secondary | ICD-10-CM

## 2012-09-11 DIAGNOSIS — T82897A Other specified complication of cardiac prosthetic devices, implants and grafts, initial encounter: Secondary | ICD-10-CM | POA: Diagnosis present

## 2012-09-11 HISTORY — DX: Chronic kidney disease, stage 3 (moderate): N18.3

## 2012-09-11 HISTORY — DX: Chronic kidney disease, stage 3 unspecified: N18.30

## 2012-09-11 HISTORY — DX: Heart disease, unspecified: I51.9

## 2012-09-11 LAB — CBC WITH DIFFERENTIAL/PLATELET
Basophils Absolute: 0 10*3/uL (ref 0.0–0.1)
Basophils Relative: 0 % (ref 0–1)
Eosinophils Absolute: 0.2 10*3/uL (ref 0.0–0.7)
Eosinophils Relative: 4 % (ref 0–5)
HCT: 37.8 % (ref 36.0–46.0)
Hemoglobin: 11.8 g/dL — ABNORMAL LOW (ref 12.0–15.0)
Lymphocytes Relative: 37 % (ref 12–46)
Lymphs Abs: 2.2 10*3/uL (ref 0.7–4.0)
MCH: 23.9 pg — ABNORMAL LOW (ref 26.0–34.0)
MCHC: 31.2 g/dL (ref 30.0–36.0)
MCV: 76.5 fL — ABNORMAL LOW (ref 78.0–100.0)
Monocytes Absolute: 0.6 10*3/uL (ref 0.1–1.0)
Monocytes Relative: 10 % (ref 3–12)
Neutro Abs: 2.9 10*3/uL (ref 1.7–7.7)
Neutrophils Relative %: 49 % (ref 43–77)
Platelets: 203 10*3/uL (ref 150–400)
RBC: 4.94 MIL/uL (ref 3.87–5.11)
RDW: 15.6 % — ABNORMAL HIGH (ref 11.5–15.5)
WBC: 6.1 10*3/uL (ref 4.0–10.5)

## 2012-09-11 LAB — COMPREHENSIVE METABOLIC PANEL
ALT: 22 U/L (ref 0–35)
AST: 22 U/L (ref 0–37)
Albumin: 3.6 g/dL (ref 3.5–5.2)
Alkaline Phosphatase: 72 U/L (ref 39–117)
BUN: 27 mg/dL — ABNORMAL HIGH (ref 6–23)
CO2: 28 mEq/L (ref 19–32)
Calcium: 9.7 mg/dL (ref 8.4–10.5)
Chloride: 102 mEq/L (ref 96–112)
Creatinine, Ser: 1.22 mg/dL — ABNORMAL HIGH (ref 0.50–1.10)
GFR calc Af Amer: 49 mL/min — ABNORMAL LOW (ref >90)
GFR calc non Af Amer: 43 mL/min — ABNORMAL LOW (ref >90)
Glucose, Bld: 182 mg/dL — ABNORMAL HIGH (ref 70–99)
Potassium: 4.3 mEq/L (ref 3.5–5.1)
Sodium: 139 mEq/L (ref 135–145)
Total Bilirubin: 0.3 mg/dL (ref 0.3–1.2)
Total Protein: 7 g/dL (ref 6.0–8.3)

## 2012-09-11 LAB — PROTIME-INR
INR: 1.07 (ref 0.00–1.49)
Prothrombin Time: 13.8 seconds (ref 11.6–15.2)

## 2012-09-11 LAB — GLUCOSE, CAPILLARY
Glucose-Capillary: 155 mg/dL — ABNORMAL HIGH (ref 70–99)
Glucose-Capillary: 155 mg/dL — ABNORMAL HIGH (ref 70–99)

## 2012-09-11 LAB — APTT: aPTT: 29 seconds (ref 24–37)

## 2012-09-11 LAB — TSH: TSH: 1.571 u[IU]/mL (ref 0.350–4.500)

## 2012-09-11 MED ORDER — INSULIN ASPART PROT & ASPART (70-30 MIX) 100 UNIT/ML ~~LOC~~ SUSP
50.0000 [IU] | Freq: Two times a day (BID) | SUBCUTANEOUS | Status: DC
  Filled 2012-09-11: qty 3

## 2012-09-11 MED ORDER — NITROGLYCERIN IN D5W 200-5 MCG/ML-% IV SOLN
5.0000 ug/min | INTRAVENOUS | Status: DC
  Administered 2012-09-11: 5 ug/min via INTRAVENOUS

## 2012-09-11 MED ORDER — SODIUM CHLORIDE 0.9 % IJ SOLN: 3.0000 mL | Freq: Two times a day (BID) | INTRAMUSCULAR | Status: DC

## 2012-09-11 MED ORDER — EXENATIDE 10 MCG/0.04ML ~~LOC~~ SOPN
10.0000 ug | PEN_INJECTOR | Freq: Two times a day (BID) | SUBCUTANEOUS | Status: DC
Start: 2012-09-11 — End: 2012-09-11

## 2012-09-11 MED ORDER — RANOLAZINE ER 500 MG PO TB12
500.0000 mg | ORAL_TABLET | Freq: Two times a day (BID) | ORAL | Status: DC
  Administered 2012-09-11 – 2012-09-19 (×15): 500 mg via ORAL
  Filled 2012-09-11 (×17): qty 1

## 2012-09-11 MED ORDER — GLIPIZIDE ER 10 MG PO TB24
10.0000 mg | ORAL_TABLET | Freq: Two times a day (BID) | ORAL | Status: DC
  Administered 2012-09-12 – 2012-09-19 (×13): 10 mg via ORAL
  Filled 2012-09-11 (×17): qty 1

## 2012-09-11 MED ORDER — NIACIN ER 500 MG PO CPCR
1000.0000 mg | ORAL_CAPSULE | Freq: Every day | ORAL | Status: DC
  Administered 2012-09-11 – 2012-09-18 (×8): 1000 mg via ORAL
  Filled 2012-09-11 (×9): qty 2

## 2012-09-11 MED ORDER — FERROUS SULFATE 325 (65 FE) MG PO TABS
325.0000 mg | ORAL_TABLET | Freq: Every day | ORAL | Status: DC
  Administered 2012-09-12 – 2012-09-19 (×8): 325 mg via ORAL
  Filled 2012-09-11 (×8): qty 1

## 2012-09-11 MED ORDER — NEBIVOLOL HCL 10 MG PO TABS
10.0000 mg | ORAL_TABLET | Freq: Every day | ORAL | Status: DC
  Administered 2012-09-12: 10 mg via ORAL
  Filled 2012-09-11 (×2): qty 1

## 2012-09-11 MED ORDER — POTASSIUM CHLORIDE CRYS ER 20 MEQ PO TBCR
20.0000 meq | EXTENDED_RELEASE_TABLET | Freq: Every day | ORAL | Status: DC
  Administered 2012-09-13 – 2012-09-17 (×5): 20 meq via ORAL
  Filled 2012-09-11 (×5): qty 1

## 2012-09-11 MED ORDER — FAMOTIDINE 20 MG PO TABS
20.0000 mg | ORAL_TABLET | Freq: Two times a day (BID) | ORAL | Status: DC
  Administered 2012-09-11 – 2012-09-16 (×10): 20 mg via ORAL
  Filled 2012-09-11 (×11): qty 1

## 2012-09-11 MED ORDER — FLUTICASONE-SALMETEROL 250-50 MCG/DOSE IN AEPB
1.0000 | INHALATION_SPRAY | Freq: Two times a day (BID) | RESPIRATORY_TRACT | Status: DC
  Administered 2012-09-11 – 2012-09-19 (×15): 1 via RESPIRATORY_TRACT
  Filled 2012-09-11: qty 14

## 2012-09-11 MED ORDER — INSULIN ASPART PROT & ASPART (70-30 MIX) 100 UNIT/ML ~~LOC~~ SUSP
50.0000 [IU] | Freq: Two times a day (BID) | SUBCUTANEOUS | Status: DC
  Administered 2012-09-13: 50 [IU] via SUBCUTANEOUS
  Filled 2012-09-11: qty 10

## 2012-09-11 MED ORDER — IRBESARTAN 150 MG PO TABS
150.0000 mg | ORAL_TABLET | Freq: Every day | ORAL | Status: DC
  Administered 2012-09-13 – 2012-09-15 (×3): 150 mg via ORAL
  Filled 2012-09-11 (×4): qty 1

## 2012-09-11 MED ORDER — EZETIMIBE 10 MG PO TABS
10.0000 mg | ORAL_TABLET | Freq: Every day | ORAL | Status: DC
  Administered 2012-09-12 – 2012-09-19 (×8): 10 mg via ORAL
  Filled 2012-09-11 (×8): qty 1

## 2012-09-11 MED ORDER — SULFAMETHOXAZOLE-TMP DS 800-160 MG PO TABS
1.0000 | ORAL_TABLET | Freq: Two times a day (BID) | ORAL | Status: DC
  Administered 2012-09-11 – 2012-09-15 (×8): 1 via ORAL
  Filled 2012-09-11 (×9): qty 1

## 2012-09-11 MED ORDER — ACETAMINOPHEN 325 MG PO TABS: 650.0000 mg | ORAL_TABLET | ORAL | Status: DC | PRN

## 2012-09-11 MED ORDER — ATORVASTATIN CALCIUM 80 MG PO TABS
80.0000 mg | ORAL_TABLET | Freq: Every day | ORAL | Status: DC
  Administered 2012-09-11 – 2012-09-18 (×8): 80 mg via ORAL
  Filled 2012-09-11 (×9): qty 1

## 2012-09-11 MED ORDER — VERAPAMIL HCL ER 120 MG PO TBCR
120.0000 mg | EXTENDED_RELEASE_TABLET | Freq: Every day | ORAL | Status: DC
  Administered 2012-09-12 – 2012-09-14 (×3): 120 mg via ORAL
  Filled 2012-09-11 (×4): qty 1

## 2012-09-11 MED ORDER — HEPARIN (PORCINE) IN NACL 100-0.45 UNIT/ML-% IJ SOLN
1450.0000 [IU]/h | INTRAMUSCULAR | Status: DC
  Administered 2012-09-11 – 2012-09-12 (×2): 1450 [IU]/h via INTRAVENOUS
  Filled 2012-09-11 (×4): qty 250

## 2012-09-11 MED ORDER — INSULIN ASPART PROT & ASPART (70-30 MIX) 100 UNIT/ML ~~LOC~~ SUSP
30.0000 [IU] | Freq: Once | SUBCUTANEOUS | Status: AC
  Administered 2012-09-11: 30 [IU] via SUBCUTANEOUS
  Filled 2012-09-11: qty 3

## 2012-09-11 MED ORDER — ONDANSETRON HCL 4 MG/2ML IJ SOLN: 4.0000 mg | Freq: Four times a day (QID) | INTRAMUSCULAR | Status: DC | PRN

## 2012-09-11 MED ORDER — TORSEMIDE 20 MG PO TABS
20.0000 mg | ORAL_TABLET | Freq: Every day | ORAL | Status: DC
  Filled 2012-09-11: qty 1

## 2012-09-11 MED ORDER — ZOLPIDEM TARTRATE 5 MG PO TABS: 5.0000 mg | ORAL_TABLET | Freq: Every evening | ORAL | Status: DC | PRN

## 2012-09-11 MED ORDER — LORATADINE 10 MG PO TABS
5.0000 mg | ORAL_TABLET | Freq: Two times a day (BID) | ORAL | Status: DC
  Administered 2012-09-11: 5 mg via ORAL
  Administered 2012-09-12: 10:00:00 via ORAL
  Administered 2012-09-12 – 2012-09-19 (×14): 5 mg via ORAL
  Filled 2012-09-11 (×18): qty 0.5

## 2012-09-11 MED ORDER — HYDROCODONE-ACETAMINOPHEN 5-325 MG PO TABS
1.0000 | ORAL_TABLET | Freq: Four times a day (QID) | ORAL | Status: DC | PRN
  Administered 2012-09-18: 2 via ORAL
  Filled 2012-09-11: qty 2

## 2012-09-11 MED ORDER — LORATADINE 10 MG PO TABS: 5.0000 mg | ORAL_TABLET | Freq: Every day | ORAL | Status: DC

## 2012-09-11 MED ORDER — SODIUM CHLORIDE 0.9 % IV SOLN
1.0000 mL/kg/h | INTRAVENOUS | Status: DC
  Administered 2012-09-12: 1 mL/kg/h via INTRAVENOUS
  Administered 2012-09-12: 0.998 mL/kg/h via INTRAVENOUS

## 2012-09-11 MED ORDER — NITROGLYCERIN 0.4 MG SL SUBL: 0.4000 mg | SUBLINGUAL_TABLET | SUBLINGUAL | Status: DC | PRN

## 2012-09-11 MED ORDER — SODIUM CHLORIDE 0.9 % IJ SOLN: 3.0000 mL | INTRAMUSCULAR | Status: DC | PRN

## 2012-09-11 MED ORDER — INSULIN ASPART 100 UNIT/ML ~~LOC~~ SOLN
0.0000 [IU] | Freq: Every day | SUBCUTANEOUS | Status: DC
  Administered 2012-09-12: 5 [IU] via SUBCUTANEOUS
  Administered 2012-09-18: 3 [IU] via SUBCUTANEOUS

## 2012-09-11 MED ORDER — ALPRAZOLAM 0.25 MG PO TABS: 0.2500 mg | ORAL_TABLET | Freq: Two times a day (BID) | ORAL | Status: DC | PRN

## 2012-09-11 MED ORDER — CLOPIDOGREL BISULFATE 75 MG PO TABS
75.0000 mg | ORAL_TABLET | Freq: Every day | ORAL | Status: DC
  Administered 2012-09-12 – 2012-09-19 (×8): 75 mg via ORAL
  Filled 2012-09-11 (×8): qty 1

## 2012-09-11 MED ORDER — NIACIN ER (ANTIHYPERLIPIDEMIC) 1000 MG PO TBCR: 1000.0000 mg | EXTENDED_RELEASE_TABLET | Freq: Every day | ORAL | Status: DC

## 2012-09-11 MED ORDER — BUDESONIDE-FORMOTEROL FUMARATE 160-4.5 MCG/ACT IN AERO: 2.0000 | INHALATION_SPRAY | Freq: Two times a day (BID) | RESPIRATORY_TRACT | Status: DC

## 2012-09-11 MED ORDER — INSULIN ASPART 100 UNIT/ML ~~LOC~~ SOLN
0.0000 [IU] | Freq: Three times a day (TID) | SUBCUTANEOUS | Status: DC
  Administered 2012-09-11: 3 [IU] via SUBCUTANEOUS
  Administered 2012-09-12: 5 [IU] via SUBCUTANEOUS
  Administered 2012-09-12: 3 [IU] via SUBCUTANEOUS
  Administered 2012-09-12: 5 [IU] via SUBCUTANEOUS
  Administered 2012-09-13: 8 [IU] via SUBCUTANEOUS
  Administered 2012-09-13: 2 [IU] via SUBCUTANEOUS
  Administered 2012-09-13: 5 [IU] via SUBCUTANEOUS
  Administered 2012-09-14: 2 [IU] via SUBCUTANEOUS
  Administered 2012-09-14: 3 [IU] via SUBCUTANEOUS
  Administered 2012-09-14: 5 [IU] via SUBCUTANEOUS
  Administered 2012-09-15 (×2): 2 [IU] via SUBCUTANEOUS
  Administered 2012-09-15 – 2012-09-16 (×2): 3 [IU] via SUBCUTANEOUS
  Administered 2012-09-17 (×2): 5 [IU] via SUBCUTANEOUS
  Administered 2012-09-18: 8 [IU] via SUBCUTANEOUS
  Administered 2012-09-18: 5 [IU] via SUBCUTANEOUS
  Administered 2012-09-19: 2 [IU] via SUBCUTANEOUS

## 2012-09-11 MED ORDER — LEVOCETIRIZINE DIHYDROCHLORIDE 5 MG PO TABS: 5.0000 mg | ORAL_TABLET | Freq: Every evening | ORAL | Status: DC

## 2012-09-11 MED ORDER — HEPARIN BOLUS VIA INFUSION
5000.0000 [IU] | Freq: Once | INTRAVENOUS | Status: AC
  Administered 2012-09-11: 5000 [IU] via INTRAVENOUS
  Filled 2012-09-11: qty 5000

## 2012-09-11 MED ORDER — NITROGLYCERIN 0.4 MG SL SUBL
0.4000 mg | SUBLINGUAL_TABLET | SUBLINGUAL | Status: DC | PRN
  Administered 2012-09-13: 0.4 mg via SUBLINGUAL

## 2012-09-11 MED ORDER — SODIUM CHLORIDE 0.9 % IV SOLN
250.0000 mL | INTRAVENOUS | Status: DC | PRN
  Administered 2012-09-11: 16:00:00 via INTRAVENOUS

## 2012-09-11 MED ORDER — ASPIRIN 81 MG PO TBEC: 81.0000 mg | DELAYED_RELEASE_TABLET | Freq: Every day | ORAL | Status: DC

## 2012-09-11 MED ORDER — NITROGLYCERIN IN D5W 200-5 MCG/ML-% IV SOLN
INTRAVENOUS | Status: AC
  Filled 2012-09-11: qty 250

## 2012-09-11 MED ORDER — TORSEMIDE 20 MG PO TABS: 20.0000 mg | ORAL_TABLET | Freq: Every day | ORAL | Status: DC

## 2012-09-11 MED ORDER — DIAZEPAM 5 MG PO TABS
5.0000 mg | ORAL_TABLET | ORAL | Status: AC
  Administered 2012-09-12: 5 mg via ORAL
  Filled 2012-09-11: qty 1

## 2012-09-11 MED ORDER — ASPIRIN EC 81 MG PO TBEC
81.0000 mg | DELAYED_RELEASE_TABLET | Freq: Every day | ORAL | Status: DC
  Administered 2012-09-12 – 2012-09-19 (×8): 81 mg via ORAL
  Filled 2012-09-11 (×8): qty 1

## 2012-09-11 MED ORDER — POTASSIUM CHLORIDE CRYS ER 20 MEQ PO TBCR: 20.0000 meq | EXTENDED_RELEASE_TABLET | Freq: Every day | ORAL | Status: DC

## 2012-09-11 NOTE — H&P (Signed)
Patient ID: Krystal [name] MRN: [id number], DOB/AGE: 1936-05-10   Admit date: 09/11/2012   Primary Physician: Evlyn Courier, MD Primary Cardiologist: Dr Tresa Endo  HPI: 76 y/o female known to Dr Tresa Endo with a history of CAD. She has had a remote RCA stent in 1995. In Feb 2007 she had RCA ISR and a new RCA site Rx'd with Cypher stents. In Jan 2008 she had an LAD Cypher placed. Cath in April 2009, December 2010, and May 2012 have shown patency of these sites. The last cath was done after an abnormal Myoview. She presented to the [place] office today with a history of 3 days of SSCP and Lt arm pain. She also says she has been SOB. Today her symptoms improved with NTG SL.    Problem List: Past Medical History  Diagnosis Date  . Sleep apnea   . Occult blood in stools   . Diastolic dysfunction   . Myalgia   . Abnormal PFT   . DM (diabetes mellitus)     type II uncontrolled  . Palpitations   . Microalbuminuria   . Microcytic anemia   . DJD (degenerative joint disease)   . Constipation   . Abnormal heart rhythm   . CAD (coronary artery disease)   . Osteoarthritis   . Low back pain   . HTN (hypertension)   . Hyperlipidemia   . GERD (gastroesophageal reflux disease)   . History of colonic polyps   . Allergic rhinitis   . Personal history of colonic polyps   . HOH (hard of hearing)   . MI (myocardial infarction) 98  . Glaucoma   . Cancer     uterine    Past Surgical History  Procedure Date  . Ectopic pregnancy surgery   . Foot surgery     left and right for callous  . Angiplasty 99/05/06/07    x 5  . Cervical discectomy     L5 left/hemilminectomy  . Colonoscopy 09/2006    int hemmorhoids, COMPLICATED BY CARDIOPULMONARY COMPLICATIONS  . Coronary angioplasty 02/2008    5 cardiac stents total  . Abdominal hysterectomy     uterine cancer  . Polypectomy 10/22/2011    internal hemorrhoids/sessile polyp     Allergies:  Allergies  Allergen Reactions  . Morphine    REACTION: swelling,SOB  . Penicillins     REACTION: swelling,SOB     Home Medications Prescriptions prior to admission  Medication Sig Dispense Refill  . aspirin 81 MG EC tablet Take 81 mg by mouth daily.        . budesonide-formoterol (SYMBICORT) 160-4.5 MCG/ACT inhaler Inhale 2 puffs into the lungs 2 (two) times daily.      . clopidogrel (PLAVIX) 75 MG tablet Take 75 mg by mouth daily.        Marland Kitchen exenatide (BYETTA 10 MCG PEN) 10 MCG/0.04ML SOLN Inject 10 mcg into the skin 2 (two) times daily with a meal.        . ezetimibe (ZETIA) 10 MG tablet Take 10 mg by mouth daily.        . famotidine (PEPCID) 20 MG tablet Take 20 mg by mouth 2 (two) times daily.       . ferrous sulfate 325 (65 FE) MG tablet Take 325 mg by mouth daily.        . Fluticasone-Salmeterol (ADVAIR DISKUS) 250-50 MCG/DOSE AEPB Inhale 1 puff into the lungs 2 (two) times daily.       . furosemide (LASIX) 40 MG tablet  Take 40 mg by mouth daily.        Marland Kitchen glipiZIDE (GLUCOTROL) 10 MG 24 hr tablet Take 10 mg by mouth 2 (two) times daily.       Marland Kitchen HYDROcodone-acetaminophen (VICODIN) 5-500 MG per tablet Take 500 mg by mouth Every 6 hours as needed. For pain      . insulin lispro protamine-insulin lispro (HUMALOG 75/25) (75-25) 100 UNIT/ML SUSP Inject 80 Units into the skin 2 (two) times daily.       . isosorbide mononitrate (IMDUR) 60 MG 24 hr tablet Take 30 mg by mouth daily.       Marland Kitchen levocetirizine (XYZAL) 5 MG tablet Take 5 mg by mouth every evening.      . loratadine (CLARITIN) 10 MG tablet Take 5 mg by mouth daily.       . Nebivolol HCl (BYSTOLIC PO) Take 40 mg by mouth daily.      . niacin (NIASPAN) 1000 MG CR tablet Take 1,000 mg by mouth daily.        Marland Kitchen NITROSTAT 0.4 MG SL tablet Take 1 tablet by mouth every 5 (five) minutes x 3 doses as needed. For chest pains      . olmesartan (BENICAR) 20 MG tablet Take 40 mg by mouth daily.      . potassium chloride (KLOR-CON) 10 MEQ CR tablet Take 40 mEq by mouth daily.       .  ranolazine (RANEXA) 500 MG 12 hr tablet Take 500 mg by mouth 2 (two) times daily.       . rosuvastatin (CRESTOR) 40 MG tablet Take 40 mg by mouth daily.        Marland Kitchen sulfamethoxazole-trimethoprim (BACTRIM DS) 800-160 MG per tablet Take 1 tablet by mouth 2 (two) times daily.      Marland Kitchen torsemide (DEMADEX) 20 MG tablet Take 20 mg by mouth Daily.      . verapamil (CALAN-SR) 120 MG CR tablet Take 1 tablet by mouth Daily.         Family History  Problem Relation Age of Onset  . Anesthesia problems Neg Hx   . Hypotension Neg Hx   . Malignant hyperthermia Neg Hx   . Pseudochol deficiency Neg Hx      History   Social History  . Marital Status: Widowed    Spouse Name: N/A    Number of Children: N/A  . Years of Education: N/A   Occupational History  . Not on file.   Social History Main Topics  . Smoking status: Former Smoker -- 0.4 packs/day    Types: Cigarettes    Quit date: 10/18/2006  . Smokeless tobacco: Not on file   Comment: quit about 4 yrs ago  . Alcohol Use: No  . Drug Use: No  . Sexually Active: Yes    Birth Control/ Protection: Surgical   Other Topics Concern  . Not on file   Social History Narrative  . No narrative on file   She lives alone, only family is an 86y/o aunt.  Review of Systems: General: negative for chills, fever, night sweats or weight changes.  Cardiovascular: negative for, edema, orthopnea, palpitations, paroxysmal nocturnal dyspnea or shortness of breath Dermatological: negative for rash Respiratory: negative for cough or wheezing Urologic: negative for hematuria Abdominal: negative for nausea, vomiting, diarrhea, bright red blood per rectum, melena, or hematemesis Neurologic: negative for visual changes, syncope, or dizziness All other systems reviewed and are otherwise negative except as noted above.  Physical Exam: Blood  pressure 173/73, height 5\' 7"  (1.702 m), weight 101.2 kg (223 lb 1.7 oz).  General appearance: alert, cooperative and no  distress Neck: no carotid bruit and no JVD Lungs: clear to auscultation bilaterally Heart: regular rate and rhythm Abdomen: morbid obesity Extremities: no edema Pulses: 2+ and symmetric Skin: cool and dry Neurologic: Grossly normal    Labs:  No results found for this or any previous visit (from the past 24 hour(s)).   Radiology/Studies: No results found.  EKG: pending  ASSESSMENT AND PLAN:  Principal Problem:  *Unstable angina Active Problems:  HYPERTENSION  CAD, prior LAD/RCA/CFX stents, last cath March 2012 showed patent stents  COPD  Diabetes mellitus  HYPERLIPIDEMIA  OBESITY  DIASTOLIC DYSFUNCTION, normal LVF  SLEEP APNEA, on C-pap  Plan- Radial cath in am. Check enzymes tonight, Heparin and NTG  Signed, Jatoria Kneeland K, PA-C 09/11/2012, 3:20 PM

## 2012-09-11 NOTE — H&P (Signed)
Pt. Seen and examined. Agree with the NP/PA-C note as written.  Patient seen in the office today by my partner Dr. Allyson Sabal. Concern for unstable angina. He has admitted her for [facility] tomorrow. She is currently chest pain free. Plan IV nitroglycerin and heparin gtts.  Chrystie Nose, MD, [facility] Attending Cardiologist [facility]

## 2012-09-11 NOTE — Progress Notes (Signed)
ANTICOAGULATION CONSULT NOTE - Initial Consult  Pharmacy Consult for Heparin Indication: chest pain/ACS  Allergies  Allergen Reactions  . Morphine     REACTION: swelling,SOB  . Penicillins     REACTION: swelling,SOB    Patient Measurements: Height: 5\' 7"  (170.2 cm) Weight: 223 lb 1.7 oz (101.2 kg) IBW/kg (Calculated) : 61.6   Labs: No results found for this basename: HGB:2,HCT:3,PLT:3,APTT:3,LABPROT:3,INR:3,HEPARINUNFRC:3,CREATININE:3,CKTOTAL:3,CKMB:3,TROPONINI:3 in the last 72 hours  Estimated Creatinine Clearance: 39.2 ml/min (by C-G formula based on Cr of 1.54).   Medical History: Past Medical History  Diagnosis Date  . Sleep apnea   . Occult blood in stools   . Diastolic dysfunction   . Myalgia   . Abnormal PFT   . DM (diabetes mellitus)     type II uncontrolled  . Palpitations   . Microalbuminuria   . Microcytic anemia   . DJD (degenerative joint disease)   . Constipation   . Abnormal heart rhythm   . CAD (coronary artery disease)   . Osteoarthritis   . Low back pain   . HTN (hypertension)   . Hyperlipidemia   . GERD (gastroesophageal reflux disease)   . History of colonic polyps   . Allergic rhinitis   . Personal history of colonic polyps   . HOH (hard of hearing)   . MI (myocardial infarction) 98  . Glaucoma   . Cancer     uterine   Assessment: 76 year old with a history of CAD.  She has had a remote RCA stent in 1995. In Feb 2007 she had RCA ISR and a new RCA site Rx'd with Cypher stents. In Jan 2008 she had an LAD Cypher placed. Cath in April 2009, December 2010, and May 2012 have shown patency of these sites. The last cath was done after an abnormal Myoview.   She presents today with a 3 day history of chest and left arm pain.  To begin heparin.  Cath planned for AM  Goal of Therapy:  Heparin level 0.3-0.7 units/ml Monitor platelets by anticoagulation protocol: Yes   Plan:  1) Heparin 5000 units iv bolus x 1 2) Heparin drip at 1450 units /  hr 3) Heparin level 6 hours after heparin begins 4) Daily heparin level, CBC  Thank you. Okey Regal, PharmD [phone]  09/11/2012,3:45 PM

## 2012-09-11 NOTE — Progress Notes (Signed)
Set up patient on Auto CPAP (6-20cmh2o) with a nasal mask and 2 liters of oxygen bled in. Filled up humidifier to full line with sterile water. Patient is tolerating well at this time. RT will monitor.

## 2012-09-12 ENCOUNTER — Encounter (HOSPITAL_COMMUNITY): Payer: Self-pay

## 2012-09-12 ENCOUNTER — Encounter (HOSPITAL_COMMUNITY)
Admission: AD | Disposition: A | Discharge status: Home Health | Payer: Self-pay | Source: Ambulatory Visit | Attending: Cardiovascular Disease

## 2012-09-12 DIAGNOSIS — I509 Heart failure, unspecified: Secondary | ICD-10-CM | POA: Diagnosis not present

## 2012-09-12 DIAGNOSIS — H409 Unspecified glaucoma: Secondary | ICD-10-CM | POA: Diagnosis present

## 2012-09-12 DIAGNOSIS — I472 Ventricular tachycardia: Secondary | ICD-10-CM | POA: Diagnosis not present

## 2012-09-12 DIAGNOSIS — Z79899 Other long term (current) drug therapy: Secondary | ICD-10-CM | POA: Diagnosis not present

## 2012-09-12 DIAGNOSIS — I2789 Other specified pulmonary heart diseases: Secondary | ICD-10-CM | POA: Diagnosis present

## 2012-09-12 DIAGNOSIS — R079 Chest pain, unspecified: Secondary | ICD-10-CM | POA: Diagnosis not present

## 2012-09-12 DIAGNOSIS — Z7902 Long term (current) use of antithrombotics/antiplatelets: Secondary | ICD-10-CM | POA: Diagnosis not present

## 2012-09-12 DIAGNOSIS — I129 Hypertensive chronic kidney disease with stage 1 through stage 4 chronic kidney disease, or unspecified chronic kidney disease: Secondary | ICD-10-CM | POA: Diagnosis present

## 2012-09-12 DIAGNOSIS — Z880 Allergy status to penicillin: Secondary | ICD-10-CM | POA: Diagnosis not present

## 2012-09-12 DIAGNOSIS — I200 Unstable angina: Secondary | ICD-10-CM | POA: Diagnosis present

## 2012-09-12 DIAGNOSIS — E875 Hyperkalemia: Secondary | ICD-10-CM | POA: Diagnosis present

## 2012-09-12 DIAGNOSIS — I10 Essential (primary) hypertension: Secondary | ICD-10-CM | POA: Diagnosis not present

## 2012-09-12 DIAGNOSIS — T82897A Other specified complication of cardiac prosthetic devices, implants and grafts, initial encounter: Secondary | ICD-10-CM | POA: Diagnosis present

## 2012-09-12 DIAGNOSIS — N9989 Other postprocedural complications and disorders of genitourinary system: Secondary | ICD-10-CM | POA: Diagnosis not present

## 2012-09-12 DIAGNOSIS — R0602 Shortness of breath: Secondary | ICD-10-CM | POA: Diagnosis not present

## 2012-09-12 DIAGNOSIS — H9190 Unspecified hearing loss, unspecified ear: Secondary | ICD-10-CM | POA: Diagnosis present

## 2012-09-12 DIAGNOSIS — I2510 Atherosclerotic heart disease of native coronary artery without angina pectoris: Secondary | ICD-10-CM | POA: Diagnosis not present

## 2012-09-12 DIAGNOSIS — I252 Old myocardial infarction: Secondary | ICD-10-CM | POA: Diagnosis not present

## 2012-09-12 DIAGNOSIS — J449 Chronic obstructive pulmonary disease, unspecified: Secondary | ICD-10-CM | POA: Diagnosis present

## 2012-09-12 DIAGNOSIS — Z7982 Long term (current) use of aspirin: Secondary | ICD-10-CM | POA: Diagnosis not present

## 2012-09-12 DIAGNOSIS — I5033 Acute on chronic diastolic (congestive) heart failure: Secondary | ICD-10-CM | POA: Diagnosis present

## 2012-09-12 DIAGNOSIS — G4730 Sleep apnea, unspecified: Secondary | ICD-10-CM | POA: Diagnosis present

## 2012-09-12 DIAGNOSIS — Z794 Long term (current) use of insulin: Secondary | ICD-10-CM | POA: Diagnosis not present

## 2012-09-12 DIAGNOSIS — E669 Obesity, unspecified: Secondary | ICD-10-CM | POA: Diagnosis present

## 2012-09-12 DIAGNOSIS — E119 Type 2 diabetes mellitus without complications: Secondary | ICD-10-CM | POA: Diagnosis present

## 2012-09-12 DIAGNOSIS — E785 Hyperlipidemia, unspecified: Secondary | ICD-10-CM | POA: Diagnosis present

## 2012-09-12 DIAGNOSIS — M169 Osteoarthritis of hip, unspecified: Secondary | ICD-10-CM | POA: Diagnosis not present

## 2012-09-12 DIAGNOSIS — N179 Acute kidney failure, unspecified: Secondary | ICD-10-CM | POA: Diagnosis not present

## 2012-09-12 DIAGNOSIS — M25559 Pain in unspecified hip: Secondary | ICD-10-CM | POA: Diagnosis not present

## 2012-09-12 HISTORY — PX: LEFT AND RIGHT HEART CATHETERIZATION WITH CORONARY ANGIOGRAM: SHX5449

## 2012-09-12 LAB — CBC
HCT: 34.8 % — ABNORMAL LOW (ref 36.0–46.0)
Hemoglobin: 10.8 g/dL — ABNORMAL LOW (ref 12.0–15.0)
MCH: 23.7 pg — ABNORMAL LOW (ref 26.0–34.0)
MCHC: 31 g/dL (ref 30.0–36.0)
MCV: 76.3 fL — ABNORMAL LOW (ref 78.0–100.0)
Platelets: 179 10*3/uL (ref 150–400)
RBC: 4.56 MIL/uL (ref 3.87–5.11)
RDW: 15.7 % — ABNORMAL HIGH (ref 11.5–15.5)
WBC: 7.5 10*3/uL (ref 4.0–10.5)

## 2012-09-12 LAB — POCT I-STAT 3, ART BLOOD GAS (G3+)
Acid-Base Excess: 1 mmol/L (ref 0.0–2.0)
O2 Saturation: 96 %

## 2012-09-12 LAB — TROPONIN I: Troponin I: <0.3 ng/mL (ref <0.30)

## 2012-09-12 LAB — POCT I-STAT 3, VENOUS BLOOD GAS (G3P V)
Bicarbonate: 27.5 mEq/L — ABNORMAL HIGH (ref 20.0–24.0)
O2 Saturation: 48 %
O2 Saturation: 48 %
TCO2: 29 mmol/L (ref 0–100)
pCO2, Ven: 55 mmHg — ABNORMAL HIGH (ref 45.0–50.0)
pH, Ven: 7.31 — ABNORMAL HIGH (ref 7.250–7.300)
pO2, Ven: 29 mmHg — CL (ref 30.0–45.0)

## 2012-09-12 LAB — BASIC METABOLIC PANEL
BUN: 22 mg/dL (ref 6–23)
CO2: 26 mEq/L (ref 19–32)
Calcium: 9.1 mg/dL (ref 8.4–10.5)
Chloride: 105 mEq/L (ref 96–112)
Creatinine, Ser: 1.17 mg/dL — ABNORMAL HIGH (ref 0.50–1.10)
GFR calc Af Amer: 52 mL/min — ABNORMAL LOW (ref >90)
GFR calc non Af Amer: 45 mL/min — ABNORMAL LOW (ref >90)
Glucose, Bld: 166 mg/dL — ABNORMAL HIGH (ref 70–99)
Potassium: 4 mEq/L (ref 3.5–5.1)
Sodium: 140 mEq/L (ref 135–145)

## 2012-09-12 LAB — HEPARIN LEVEL (UNFRACTIONATED)
Heparin Unfractionated: 0.49 IU/mL (ref 0.30–0.70)
Heparin Unfractionated: 0.6 IU/mL (ref 0.30–0.70)

## 2012-09-12 LAB — GLUCOSE, CAPILLARY
Glucose-Capillary: 185 mg/dL — ABNORMAL HIGH (ref 70–99)
Glucose-Capillary: 202 mg/dL — ABNORMAL HIGH (ref 70–99)
Glucose-Capillary: 239 mg/dL — ABNORMAL HIGH (ref 70–99)

## 2012-09-12 LAB — HEMOGLOBIN A1C
Hgb A1c MFr Bld: 10.2 % — ABNORMAL HIGH (ref <5.7)
Mean Plasma Glucose: 246 mg/dL — ABNORMAL HIGH (ref <117)

## 2012-09-12 SURGERY — LEFT AND RIGHT HEART CATHETERIZATION WITH CORONARY ANGIOGRAM
Anesthesia: LOCAL

## 2012-09-12 MED ORDER — OXYCODONE-ACETAMINOPHEN 5-325 MG PO TABS
1.0000 | ORAL_TABLET | ORAL | Status: DC | PRN
  Administered 2012-09-18: 1 via ORAL
  Filled 2012-09-12: qty 1

## 2012-09-12 MED ORDER — SODIUM CHLORIDE 0.9 % IV SOLN: INTRAVENOUS | Status: DC

## 2012-09-12 MED ORDER — LEVALBUTEROL HCL 0.63 MG/3ML IN NEBU
0.6300 mg | INHALATION_SOLUTION | Freq: Three times a day (TID) | RESPIRATORY_TRACT | Status: DC | PRN
  Administered 2012-09-14: 0.63 mg via RESPIRATORY_TRACT
  Filled 2012-09-12: qty 3

## 2012-09-12 MED ORDER — NITROGLYCERIN 0.2 MG/ML ON CALL CATH LAB
INTRAVENOUS | Status: AC
  Filled 2012-09-12: qty 1

## 2012-09-12 MED ORDER — LIDOCAINE HCL (PF) 1 % IJ SOLN
INTRAMUSCULAR | Status: AC
  Filled 2012-09-12: qty 30

## 2012-09-12 MED ORDER — FUROSEMIDE 10 MG/ML IJ SOLN
40.0000 mg | Freq: Once | INTRAMUSCULAR | Status: AC
  Administered 2012-09-12: 40 mg via INTRAVENOUS
  Filled 2012-09-12: qty 4

## 2012-09-12 MED ORDER — HYDRALAZINE HCL 20 MG/ML IJ SOLN
10.0000 mg | Freq: Four times a day (QID) | INTRAMUSCULAR | Status: DC | PRN
  Administered 2012-09-13: 10 mg via INTRAVENOUS
  Filled 2012-09-12: qty 1

## 2012-09-12 MED ORDER — FUROSEMIDE 80 MG PO TABS
80.0000 mg | ORAL_TABLET | Freq: Every day | ORAL | Status: DC
  Filled 2012-09-12: qty 1

## 2012-09-12 MED ORDER — ONDANSETRON HCL 4 MG/2ML IJ SOLN: 4.0000 mg | Freq: Four times a day (QID) | INTRAMUSCULAR | Status: DC | PRN

## 2012-09-12 MED ORDER — HEPARIN (PORCINE) IN NACL 2-0.9 UNIT/ML-% IJ SOLN
INTRAMUSCULAR | Status: AC
  Filled 2012-09-12: qty 1000

## 2012-09-12 MED ORDER — ACETAMINOPHEN 325 MG PO TABS: 650.0000 mg | ORAL_TABLET | ORAL | Status: DC | PRN

## 2012-09-12 NOTE — CV Procedure (Signed)
Krystal [name] CARDIAC CATHETERIZATION REPORT   Procedures performed:  1. Right and left heart catheterization  2. Selective coronary angiography  3. Left ventriculography  4. MYNX arteriotomy closure device  Reason for procedure:  Unstable angina pectoris Congestive heart failure  Procedure performed by: Thurmon Fair, MD, FACC  Complications: none   Estimated blood loss: less than 5 mL   History:  76 yo woman with longstanding CAD s/p multiple PCI procedures  Consent: The risks, benefits, and details of the procedure were explained to the patient. Risks including death, MI, stroke, bleeding, limb ischemia, renal failure and allergy were described and accepted by the patient. Informed written consent was obtained prior to proceeding.  Technique: The patient was brought to the cardiac catheterization laboratory in the fasting state. He was prepped and draped in the usual sterile fashion. Local anesthesia with 1% lidocaine was administered to the right groin area. Using the modified Seldinger technique a 5 French right common femoral artery sheath was introduced without difficulty. In a similar fashion a 7 [place] right common femoral vein sheath was introduced. Under fluoroscopic guidance, using 5 [place] JL4, JR and angled pigtail catheters, selective cannulation of the left coronary artery, right coronary artery and left ventricle were respectively performed. Several coronary angiograms in a variety of projections were recorded, as well as a left ventriculogram in the RAO projection. Left ventricular pressure and a pull back to the aorta were recorded. No immediate complications occurred. At the end of the procedure, all catheters were removed. After the procedure, arterial hemostasis was achieved with a minx closure device and venous hemostasis will be achieved with manual pressure.  Contrast used: 100 mL Omnipaque  Angiographic Findings:  1. The left main coronary artery is free of significant  atherosclerosis and bifurcates in the usual fashion into the left anterior descending artery and left circumflex coronary artery.  2. The left anterior descending artery is a large vessel that reaches the apex and generates two major diagonal branches. There is evidence of extensive luminal irregularities and mild calcification. There is a widely patent stent in the proximal vessel with no more than 20% in-stent restenosis. The first septal artery is "jailed" with approximately 80-90% ostial stenosis. No other hemodynamically meaningful stenoses are seen. 3. The left circumflex coronary artery is a large-size vessel on dominant vessel that generates 3 major oblique marginal arteries, of which the first is by far the largest  . There is evidence of extensive luminal irregularities and mild calcification. There is a widely patent stent in the proximal vessel with no more than 20-30% in-stent restenosis. No other hemodynamically meaningful stenoses are seen. 4. The right coronary artery is a large-size dominant vessel that generates a medium-size posterior lateral ventricular system as well as a long and branching posterior descending artery. There is evidence of extensive luminal irregularities and mild calcification. Therapeutically 3 stents in the right coronary artery. The first is located in the midportion of the vessel and is widely patent with minimal in stent restenosis. The other 2 stents appear to be located just upstream and just downstream of the right coronary artery bifurcation at the crux. There is approximately 40-50% in-stent restenosis in the proximal stent in a diffuse pattern. There is approximately 30% in-stent restenosis in the distal stent which appears to be smaller in caliber. The ostium the posterolateral artery is mildly "pinched" with a maximum diameter stenosis of about 50%. No other hemodynamically meaningful stenoses are seen.  5. The left ventricle is normal in size.  The left  ventricle systolic function is normal with an estimated ejection fraction of 65%. Regional wall motion abnormalities are not seen. No left ventricular thrombus is seen. There is no mitral insufficiency. The ascending aorta appears normal in caliber, with mild calcification. There is no aortic valve stenosis by pullback.    Hemodynamic findings:  Aortic pressure 123/51 (mean 78) mm Hg Left ventricle 130/10 with end-diastolic pressure of 23 mm Hg Pulmonary artery 47/20 (mean 30) mm Hg Pulmonary artery wedge pressure A wave 25, V wave 29, mean 26 mm Hg Right ventricle 50/10 with an end-diastolic pressure of 13 mm Hg Right atrium A wave 14, V wave 14,mean 12 mm Hg  Cardiac output is  4L per minute (cardiac index 1.9 L per minute per meter sq)  Systemic arterial oxygen saturation was 96% Right atrial  48% Pulmonary artery 48%  Pulmonary vascular resistance  200 DSC (424DSCI) Systemic vascular resistance    1420 DSC (3011 DSCI)   IMPRESSIONS:  Extensive coronary atherosclerosis without hemodynamically significant stenoses in the large epicardial arteries. Previously stented areas are widely patent with mild to moderate in-stent restenosis There is evidence of acute on chronic diastolic left ventricular failure as well as moderate pulmonary arterial hypertension and mild right heart failure. Transpulmonary gradient is quite low suggesting that the pulmonary arterial hypertension is primarily related to diastolic heart failure RECOMMENDATION:  Continued coronary risk factor modification. Diuretics. Continue intravenous nitroglycerin overnight to assist diuresis.

## 2012-09-12 NOTE — Progress Notes (Signed)
Inpatient Diabetes Program Recommendations  AACE/ADA: New Consensus Statement on Inpatient Glycemic Control (2013)  Target Ranges:  Prepandial:   less than 140 mg/dL      Peak postprandial:   less than 180 mg/dL (1-2 hours)      Critically ill patients:  140 - 180 mg/dL    Inpatient Diabetes Program Recommendations HgbA1C: =10.2 Outpatient Referral: Order OP ed if patient has not been to OP classes before. Diabetes Coordinator will give patient information for Diabetes and nutrition classes at [facility].   The diabetes videos have been ordered for patient to view on the patient education network as soon as she is alert and oriented.  Thank you  Piedad Climes RN,BSN,CDE Inpatient Diabetes Coordinator [phone] (team pager)

## 2012-09-12 NOTE — Progress Notes (Signed)
[facility]  Subjective: No CP.  She is SOB but not more than usual.  Objective: Vital signs in last 24 hours: Temp:  [98.2 F (36.8 C)-98.8 F (37.1 C)] 98.8 F (37.1 C) (10/25 0431) Pulse Rate:  [74-87] 85  (10/25 0431) Resp:  [14-20] 18  (10/25 0758) BP: (117-191)/(17-120) 191/58 mmHg (10/25 0758) SpO2:  [98 %-100 %] 98 % (10/25 0758) Weight:  [101.2 kg (223 lb 1.7 oz)] 101.2 kg (223 lb 1.7 oz) (10/24 1500) Last BM Date: 09/11/12  Intake/Output from previous day: 10/24 0701 - 10/25 0700 In: 1904 [P.O.:440; I.V.:1464] Out: 700 [Urine:700] Intake/Output this shift: Total I/O In: 121.5 [I.V.:121.5] Out: -   Medications Current Facility-Administered Medications  Medication Dose Route Frequency Provider Last Rate Last Dose  . 0.9 %  sodium chloride infusion  250 mL Intravenous PRN Abelino Derrick, PA 10 mL/hr at 09/11/12 1800 250 mL at 09/11/12 1800  . 0.9 %  sodium chloride infusion  1 mL/kg/hr Intravenous Continuous Eda Paschal [place], [place] 101 mL/hr at 09/12/12 0650 0.998 mL/kg/hr at 09/12/12 0650  . acetaminophen (TYLENOL) tablet 650 mg  650 mg Oral Q4H PRN Abelino Derrick, PA      . ALPRAZolam Prudy Feeler) tablet 0.25 mg  0.25 mg Oral BID PRN Abelino Derrick, PA      . aspirin EC tablet 81 mg  81 mg Oral Daily Eda Paschal [place], [place]      . atorvastatin (LIPITOR) tablet 80 mg  80 mg Oral [address] [place], [place]   80 mg at 09/11/12 1744  . clopidogrel (PLAVIX) tablet 75 mg  75 mg Oral Daily Luke K [place], [place]      . diazepam (VALIUM) tablet 5 mg  5 mg Oral On Call Abelino Derrick, [place]      . ezetimibe (ZETIA) tablet 10 mg  10 mg Oral Daily Eda Paschal [place], [place]      . famotidine (PEPCID) tablet 20 mg  20 mg Oral BID Eda Paschal [place], [place]   20 mg at 09/11/12 2138  . ferrous sulfate tablet 325 mg  325 mg Oral Daily Eda Paschal [place], [place]      . Fluticasone-Salmeterol (ADVAIR) 250-50 MCG/DOSE inhaler 1 puff  1 puff Inhalation BID Eda Paschal [place], [place]   1 puff at 09/11/12 1952  .  glipiZIDE (GLUCOTROL XL) 24 hr tablet 10 mg  10 mg Oral BID WC Luke K [place], [place]      . heparin ADULT infusion 100 units/mL (25000 units/250 mL)  1,450 Units/hr Intravenous Continuous Runell Gess, MD 14.5 mL/hr at 09/12/12 0650 1,450 Units/hr at 09/12/12 0650  . heparin bolus via infusion 5,000 Units  5,000 Units Intravenous Once Runell Gess, MD   5,000 Units at 09/11/12 1600  . HYDROcodone-acetaminophen (NORCO/VICODIN) 5-325 MG per tablet 1-2 tablet  1-2 tablet Oral Q6H PRN Abelino Derrick, PA      . insulin aspart (novoLOG) injection 0-15 Units  0-15 Units Subcutaneous TID WC Abelino Derrick, PA   3 Units at 09/11/12 1744  . insulin aspart (novoLOG) injection 0-5 Units  0-5 Units Subcutaneous QHS Eda Paschal [place], [place]      . insulin aspart protamine-insulin aspart (NOVOLOG 70/30) injection 30 Units  30 Units Subcutaneous Once Runell Gess, MD   30 Units at 09/11/12 1814  . insulin aspart protamine-insulin aspart (NOVOLOG 70/30) injection 50 Units  50 Units Subcutaneous BID WC Runell Gess, MD      .  irbesartan (AVAPRO) tablet 150 mg  150 mg Oral Daily Eda Paschal [place], [place]      . loratadine (CLARITIN) tablet 5 mg  5 mg Oral BID Runell Gess, MD   5 mg at 09/11/12 2138  . nebivolol (BYSTOLIC) tablet 10 mg  10 mg Oral Daily Eda Paschal [place], [place]      . niacin CR capsule 1,000 mg  1,000 mg Oral QHS Runell Gess, MD   1,000 mg at 09/11/12 2137  . nitroGLYCERIN (NITROSTAT) SL tablet 0.4 mg  0.4 mg Sublingual Q5 Min x 3 PRN Abelino Derrick, PA      . nitroGLYCERIN 0.2 mg/mL in dextrose 5 % infusion  5 mcg/min Intravenous Titrated Abelino Derrick, PA 3 mL/hr at 09/11/12 1800 10 mcg/min at 09/11/12 1800  . nitroGLYCERIN 0.2 mg/mL in dextrose 5 % infusion           . ondansetron (ZOFRAN) injection 4 mg  4 mg Intravenous Q6H PRN Abelino Derrick, PA      . potassium chloride SA (K-DUR,KLOR-CON) CR tablet 20 mEq  20 mEq Oral Daily Runell Gess, MD      . ranolazine (RANEXA) 12 hr tablet 500 mg  500 mg  Oral BID Eda Paschal [place], PA   500 mg at 09/11/12 2138  . sodium chloride 0.9 % injection 3 mL  3 mL Intravenous Q12H [employer], [place]      . sodium chloride 0.9 % injection 3 mL  3 mL Intravenous PRN Abelino Derrick, PA      . sulfamethoxazole-trimethoprim (BACTRIM DS) 800-160 MG per tablet 1 tablet  1 tablet Oral BID Abelino Derrick, [place]   1 tablet at 09/11/12 2137  . torsemide (DEMADEX) tablet 20 mg  20 mg Oral Daily Runell Gess, MD      . verapamil (CALAN-SR) CR tablet 120 mg  120 mg Oral Daily Eda Paschal [place], [place]      . zolpidem (AMBIEN) tablet 5 mg  5 mg Oral QHS PRN Abelino Derrick, PA      . DISCONTD: aspirin EC tablet 81 mg  81 mg Oral Daily Eda Paschal [place], [place]      . DISCONTD: budesonide-formoterol (SYMBICORT) 160-4.5 MCG/ACT inhaler 2 puff  2 puff Inhalation BID Abelino Derrick, [place]      . DISCONTD: exenatide (BYETTA) injection SOLN 10 mcg  10 mcg Subcutaneous BID WC Abelino Derrick, [place]      . DISCONTD: insulin aspart protamine-insulin aspart (NOVOLOG 70/30) injection 50 Units  50 Units Subcutaneous BID WC Abelino Derrick, [place]      . DISCONTD: insulin aspart protamine-insulin aspart (NOVOLOG 70/30) injection 50 Units  50 Units Subcutaneous BID WC Runell Gess, MD      . DISCONTD: levocetirizine (XYZAL) tablet 5 mg  5 mg Oral QPM Abelino Derrick, [place]      . DISCONTD: loratadine (CLARITIN) tablet 5 mg  5 mg Oral Daily Eda Paschal [place], [place]      . DISCONTD: niacin (NIASPAN) CR tablet 1,000 mg  1,000 mg Oral Daily Abelino Derrick, [place]      . DISCONTD: nitroGLYCERIN (NITROSTAT) SL tablet 0.4 mg  0.4 mg Sublingual Q5 Min x 3 PRN Abelino Derrick, [place]      . DISCONTD: potassium chloride SA (K-DUR,KLOR-CON) CR tablet 20 mEq  20 mEq Oral Daily Abelino Derrick, [place]      . DISCONTD: torsemide (DEMADEX) tablet 20  mg  20 mg Oral Daily Eda Paschal [place], [place]        PE: General appearance: alert, cooperative and no distress Lungs: Slight bilateral wheeze.  Decreased BS. Heart: regular rate and rhythm, S1, S2 normal, no murmur,  click, rub or gallop Extremities: No LEE Pulses: 2+ and symmetric Skin: Warm and dry. Neurologic: Grossly normal  Lab Results:   Basename 09/12/12 0455 09/11/12 1618  WBC 7.5 6.1  HGB 10.8* 11.8*  HCT 34.8* 37.8  PLT 179 203   BMET  Basename 09/12/12 0455 09/11/12 1618  NA 140 139  K 4.0 4.3  CL 105 102  CO2 26 28  GLUCOSE 166* 182*  BUN 22 27*  CREATININE 1.17* 1.22*  CALCIUM 9.1 9.7   PT/INR  Basename 09/11/12 1618  LABPROT 13.8  INR 1.07   Cardiac Panel (last 3 results)  Basename 09/11/12 1556  CKTOTAL --  CKMB --  TROPONINI <0.30  RELINDX --    Studies/Results: @RISRSLT2 @   Assessment/Plan  Principal Problem:  *Unstable angina Active Problems:  Diabetes mellitus  HYPERLIPIDEMIA  OBESITY  HYPERTENSION  CAD, prior LAD/RCA/CFX stents, last cath March 2012 showed patent stents  DIASTOLIC DYSFUNCTION, normal LVF  COPD  SLEEP APNEA, on C-pap  Plan:   Left radial cath today.  Troponin negative.  BP is elevated.  Will given IV hydralazine now.  Adding Xopenex for wheezing as well.  A1C: 10.2   Lateral TWI.  Septal Q's.   LOS: 1 day    HAGER, BRYAN 09/12/2012 8:13 AM  I have seen and examined the patient along with Wilburt Finlay, PA.  I have reviewed the chart, notes and new data.  I agree with PA's note.  Key new complaints: little change in symptoms Key examination changes: mild wheezing; elevated BP Key new findings / data: normal enzymes, ECG changes could well be from LVH or ischemia.  PLAN: Only reasonable way to exclude a new coronary problem is coronary angiography, especially with a history of false positive nuclear perfusion studies. Will try radial approach in view of her obesity. This procedure has been fully reviewed with the patient and written informed consent has been obtained.  Thurmon Fair, MD, [facility] [phone] 09/12/2012, 8:50 AM

## 2012-09-12 NOTE — Progress Notes (Signed)
Pt. Placed herself on cpap. RT connected pt. Oxygen to the cpap. Pt. On auto titrate mode and is tolerating well at this time. Pt. Wearing a nasal mask. RT will continue to monitor.

## 2012-09-12 NOTE — Progress Notes (Signed)
To the cath lab by bed, stable. 

## 2012-09-12 NOTE — Progress Notes (Signed)
ANTICOAGULATION CONSULT NOTE - Follow Up Consult  Pharmacy Consult for Heparin Indication: chest pain/ACS  Allergies  Allergen Reactions  . Morphine Shortness Of Breath and Swelling  . Penicillins Shortness Of Breath and Swelling    Patient Measurements: Height: 5\' 7"  (170.2 cm) Weight: 223 lb 1.7 oz (101.2 kg) IBW/kg (Calculated) : 61.6  Heparin Dosing Weight: 84kg  Vital Signs: Temp: 98 F (36.7 C) (10/25 0844) Temp src: Oral (10/25 0844) BP: 191/58 mmHg (10/25 0758) Pulse Rate: 84  (10/25 0844)  Labs:  Basename 09/12/12 0455 09/11/12 2333 09/11/12 1618 09/11/12 1556  HGB 10.8* -- 11.8* --  HCT 34.8* -- 37.8 --  PLT 179 -- 203 --  APTT -- -- 29 --  LABPROT -- -- 13.8 --  INR -- -- 1.07 --  HEPARINUNFRC 0.60 0.49 -- --  CREATININE 1.17* -- 1.22* --  CKTOTAL -- -- -- --  CKMB -- -- -- --  TROPONINI -- -- -- <0.30    Estimated Creatinine Clearance: 51.5 ml/min (by C-G formula based on Cr of 1.17).   Medications:  Heparin @ 1450 units/hr  Assessment: 76yof continues on heparin with a therapeutic heparin level. CBC is fairly stable. No bleeding. For radial cath today.  Goal of Therapy:  Heparin level 0.3-0.7 units/ml Monitor platelets by anticoagulation protocol: Yes   Plan:  1) Continue heparin at 1450 units/hr 2) Follow up after cath  Fredrik Rigger 09/12/2012,11:21 AM

## 2012-09-12 NOTE — Progress Notes (Signed)
ANTICOAGULATION CONSULT NOTE - Follow Up Consult  Pharmacy Consult for heparin Indication: chest pain/ACS  Allergies  Allergen Reactions  . Morphine Shortness Of Breath and Swelling  . Penicillins Shortness Of Breath and Swelling    Patient Measurements: Height: 5\' 7"  (170.2 cm) Weight: 223 lb 1.7 oz (101.2 kg) IBW/kg (Calculated) : 61.6    Vital Signs: Temp: 98.8 F (37.1 C) (10/24 2342) Temp src: Oral (10/24 2342) BP: 179/65 mmHg (10/24 2342) Pulse Rate: 86  (10/24 2342)  Labs:  Basename 09/11/12 2333 09/11/12 1618 09/11/12 1556  HGB -- 11.8* --  HCT -- 37.8 --  PLT -- 203 --  APTT -- 29 --  LABPROT -- 13.8 --  INR -- 1.07 --  HEPARINUNFRC 0.49 -- --  CREATININE -- 1.22* --  CKTOTAL -- -- --  CKMB -- -- --  TROPONINI -- -- <0.30    Estimated Creatinine Clearance: 49.4 ml/min (by C-G formula based on Cr of 1.22).   Medications:  Scheduled:    . aspirin EC  81 mg Oral Daily  . atorvastatin  80 mg Oral q1800  . clopidogrel  75 mg Oral Daily  . diazepam  5 mg Oral On Call  . ezetimibe  10 mg Oral Daily  . famotidine  20 mg Oral BID  . ferrous sulfate  325 mg Oral Daily  . Fluticasone-Salmeterol  1 puff Inhalation BID  . glipiZIDE  10 mg Oral BID WC  . heparin  5,000 Units Intravenous Once  . insulin aspart  0-15 Units Subcutaneous TID WC  . insulin aspart  0-5 Units Subcutaneous QHS  . insulin aspart protamine-insulin aspart  30 Units Subcutaneous Once  . insulin aspart protamine-insulin aspart  50 Units Subcutaneous BID WC  . irbesartan  150 mg Oral Daily  . loratadine  5 mg Oral BID  . nebivolol  10 mg Oral Daily  . niacin  1,000 mg Oral QHS  . nitroGLYCERIN      . potassium chloride  20 mEq Oral Daily  . ranolazine  500 mg Oral BID  . sodium chloride  3 mL Intravenous Q12H  . sulfamethoxazole-trimethoprim  1 tablet Oral BID  . torsemide  20 mg Oral Daily  . verapamil  120 mg Oral Daily  . DISCONTD: aspirin  81 mg Oral Daily  . DISCONTD:  budesonide-formoterol  2 puff Inhalation BID  . DISCONTD: exenatide  10 mcg Subcutaneous BID WC  . DISCONTD: insulin aspart protamine-insulin aspart  50 Units Subcutaneous BID WC  . DISCONTD: insulin aspart protamine-insulin aspart  50 Units Subcutaneous BID WC  . DISCONTD: levocetirizine  5 mg Oral QPM  . DISCONTD: loratadine  5 mg Oral Daily  . DISCONTD: niacin  1,000 mg Oral Daily  . DISCONTD: potassium chloride  20 mEq Oral Daily  . DISCONTD: torsemide  20 mg Oral Daily   Infusions:    . sodium chloride 0.998 mL/kg/hr (09/11/12 2000)  . heparin 1,450 Units/hr (09/11/12 1640)  . nitroGLYCERIN 10 mcg/min (09/11/12 1800)    Assessment: 76 yo female with ACS/chest pain is currently on therapeutic heparin.  Heparin level 0.49. Goal of Therapy:  Heparin level 0.3-0.7 units/ml Monitor platelets by anticoagulation protocol: Yes   Plan:  1) Continue heparin at 1450 units/hr. 2) Follow up heparin level and CBC in am.  Demerius Podolak, Tsz-Yin 09/12/2012,1:12 AM

## 2012-09-12 NOTE — Progress Notes (Signed)
Right groin dressing soaked with blood. Dressing reinforced manual pressure applied x 20 min. Pressure dressing applied. Bleeding stopped. Site soft to touch, continue to monitor.

## 2012-09-13 ENCOUNTER — Encounter (HOSPITAL_COMMUNITY): Payer: Self-pay | Admitting: Cardiology

## 2012-09-13 DIAGNOSIS — I5033 Acute on chronic diastolic (congestive) heart failure: Secondary | ICD-10-CM | POA: Diagnosis present

## 2012-09-13 LAB — CBC
HCT: 34.5 % — ABNORMAL LOW (ref 36.0–46.0)
MCV: 76.3 fL — ABNORMAL LOW (ref 78.0–100.0)
RBC: 4.52 MIL/uL (ref 3.87–5.11)
WBC: 7.2 10*3/uL (ref 4.0–10.5)

## 2012-09-13 LAB — GLUCOSE, CAPILLARY: Glucose-Capillary: 131 mg/dL — ABNORMAL HIGH (ref 70–99)

## 2012-09-13 MED ORDER — MAGNESIUM HYDROXIDE 400 MG/5ML PO SUSP
30.0000 mL | Freq: Every day | ORAL | Status: DC | PRN
  Administered 2012-09-13: 30 mL via ORAL
  Filled 2012-09-13: qty 30

## 2012-09-13 MED ORDER — NEBIVOLOL HCL 10 MG PO TABS
40.0000 mg | ORAL_TABLET | Freq: Every day | ORAL | Status: DC
  Administered 2012-09-14 – 2012-09-17 (×4): 40 mg via ORAL
  Filled 2012-09-13 (×5): qty 4

## 2012-09-13 MED ORDER — NEBIVOLOL HCL 10 MG PO TABS
10.0000 mg | ORAL_TABLET | Freq: Once | ORAL | Status: AC
  Administered 2012-09-13: 10 mg via ORAL
  Filled 2012-09-13: qty 1

## 2012-09-13 MED ORDER — FUROSEMIDE 10 MG/ML IJ SOLN
40.0000 mg | Freq: Two times a day (BID) | INTRAMUSCULAR | Status: DC
  Administered 2012-09-13 – 2012-09-14 (×3): 40 mg via INTRAVENOUS
  Filled 2012-09-13 (×4): qty 4

## 2012-09-13 MED ORDER — INSULIN ASPART PROT & ASPART (70-30 MIX) 100 UNIT/ML ~~LOC~~ SUSP
70.0000 [IU] | Freq: Two times a day (BID) | SUBCUTANEOUS | Status: DC
  Administered 2012-09-13 – 2012-09-14 (×2): 70 [IU] via SUBCUTANEOUS
  Administered 2012-09-14: 20 [IU] via SUBCUTANEOUS
  Administered 2012-09-15: 70 [IU] via SUBCUTANEOUS
  Administered 2012-09-15: 40 [IU] via SUBCUTANEOUS
  Administered 2012-09-16 – 2012-09-19 (×6): 70 [IU] via SUBCUTANEOUS
  Filled 2012-09-13: qty 3

## 2012-09-13 NOTE — Progress Notes (Signed)
Pt complaining of chest pain 7/10.  BP is 153/63 and HR is 60.  EKG obtained.  One nitro SL given.  Pt verbalizes pain has resolved.  Will continue to monitor.

## 2012-09-13 NOTE — Progress Notes (Signed)
[facility] DAILY PROGRESS NOTE  NAME:  Krystal [name]   MRN: [id number] DOB:  [birth year]   ADMIT DATE: 09/11/2012   Patient Description   76 y.o. female with PMH below presented with three-day history of substernal chest pain radiating to the left arm history personal angina. She underwent diagnostic right and a heart catheterization yesterday by Dr. Royann Shivers with results as noted below. No evidence of obstructive coronary disease to explain angina however there is evidence of acute on chronic diastolic dysfunction/heart failure.    Past Medical History  Diagnosis Date  . Sleep apnea   . Occult blood in stools   . Diastolic dysfunction   . Myalgia   . Abnormal PFT   . DM (diabetes mellitus)     type II uncontrolled  . Palpitations   . Microalbuminuria   . Microcytic anemia   . DJD (degenerative joint disease)   . Constipation   . Abnormal heart rhythm   . CAD (coronary artery disease)     RCA stent in 1995. In Feb 2007 she had RCA ISR and a new RCA site Rx'd with Cypher stents. In Jan 2008 she had an LAD Cypher placed; Cath 09/12/12 - patent stents: 1 mLAD, 1 Cx, 3 in RCA  . Osteoarthritis   . Low back pain   . HTN (hypertension)   . Hyperlipidemia   . GERD (gastroesophageal reflux disease)   . History of colonic polyps   . Allergic rhinitis   . Personal history of colonic polyps   . HOH (hard of hearing)   . MI (myocardial infarction) 98  . Glaucoma(365)   . Cancer     uterine  . Diastolic dysfunction, left ventricle 09/12/12   Length of Stay:  LOS: 2 days   Subjective:   Today Abigail Miyamoto was quite short of breath getting up from her chair to the bathroom. She denied any further chest discomfort was mostly dyspneic. Sitting in a chair she is comfortable on her home oxygen level. Her blood pressures overnight abrasion 130/66-148/52.  Objective:  Temp:  [97.4 F (36.3 C)-98.5 F (36.9 C)] 98.3 F (36.8 C) (10/26 0745) Pulse  Rate:  [51-79] 77  (10/26 0745) Resp:  [14-23] 18  (10/26 0745) BP: (109-174)/(36-76) 133/66 mmHg (10/26 0745) SpO2:  [97 %-100 %] 99 % (10/26 0745) Weight change:  Physical Exam: General appearance: alert, cooperative, mild distress and pleasant mood & affect Neck: no adenopathy, no carotid bruit, no JVD, supple, symmetrical, trachea midline and thyroid not enlarged, symmetric, no tenderness/mass/nodules Lungs: diminished breath sounds bibasilar and wheezes mild scattered; no "rales" Heart: regular rate and rhythm, S1, S2 normal, no murmur, click, rub or gallop and no S4 auscultated due to body habitus Abdomen: soft, non-tender; bowel sounds normal; no masses,  no organomegaly and obese Extremities: edema mild Pulses: 2+ and symmetric  Intake/Output from previous day: 10/25 0701 - 10/26 0700 In: 1108.5 [P.O.:320; I.V.:788.5] Out: -   Intake/Output Summary (Last 24 hours) at 09/13/12 0917 Last data filed at 09/13/12 0600  Gross per 24 hour  Intake    987 ml  Output      0 ml  Net    987 ml    Lab 09/12/12 0455 09/11/12 1618  NA 140 139  K 4.0 4.3  CL 105 102  CO2 26 28  BUN 22 27*  CREATININE 1.17* 1.22*     Lab 09/13/12 0455 09/12/12 0455 09/11/12 1618  WBC 7.2 7.5  6.1  HGB 10.6* 10.8* 11.8*  HCT 34.5* 34.8* 37.8  PLT 171 179 203   No components found with this basename: TROP:3, BNP:3  Imaging:   R&LHC yesterday:  Extensive coronary atherosclerosis without hemodynamically significant stenoses in the large epicardial arteries. Previously stented areas are widely patent with mild to moderate in-stent restenosis  Hemodynamic findings:  Aortic pressure 123/51 (mean 78) mm Hg  Left ventricle 130/10 with end-diastolic pressure of 23 mm Hg  Pulmonary artery 47/20 (mean 30) mm Hg  Pulmonary artery wedge pressure A wave 25, V wave 29, mean 26 mm Hg  Right ventricle 50/10 with an end-diastolic pressure of 13 mm Hg  Right atrium A wave 14, V wave 14,mean 12 mm Hg    Cardiac output is 4L per minute (cardiac index 1.9 L per minute per meter sq)   IMPRESSIONS:  Mild to moderate ISR Acute on Chronic Diastolic Left Ventricular Failure as well as moderate pulmonary arterial hypertension and mild right heart failure.  Transpulmonary gradient is quite low suggesting that the pulmonary arterial hypertension is primarily related to diastolic heart failure  RECOMMENDATION:  Continued coronary risk factor modification.  Diuretics.  Continue intravenous nitroglycerin overnight to assist diuresis.   MAR Reviewed  Assessment/Plan:  Active Problems:  DIASTOLIC DYSFUNCTION, normal LVF  Unstable angina  Acute on chronic diastolic heart failure - with SSx of Unstable Angina  Diabetes mellitus  OBESITY  CAD, prior LAD/RCA/CFX stents, cath 09/12/12 with patent stents  HYPERTENSION, PULMONARY - partially due to LV diastolic dysfunction  BRONCHITIS, CHRONIC  HYPERLIPIDEMIA  HYPERTENSION  COPD  SLEEP APNEA, on C-pap  Blood pressure much better controlled overnight, however neck ~2 L positive over 48 hours or not recorded/measured correctly Hemoglobin stable after initial drop with precatheterization.  She's a very interesting regimen of medications at home including was recorded as diastolic 40 mg daily, Imdur 60 mg daily, Arava female 120 mg daily, torsemide 20 mg daily, Benicar 40 mg daily, lisinopril 10 mg daily, Lasix 40 mg daily. She is also on Crestor 40 mg and that Zetia 10 mg and that it can further lipids. She is on aspirin and Plavix. For diabetes she is on glipizide 10 mg, Byetta 10 mg - 3 times a day and Humalog mix 75/25 8 units in the morning. She also thinks that she is taking Ranexa 0 but is not.  Time Spent Directly with Patient:  Least 15 minutes talking with the patient this morning she is quite confused as to what her regimen is. She is still short of breath simply walking to the bathroom which is more than her baseline. I still think she needs  more time for diuresis. My plan is to give her IV diuretics today. I've asked Dr. Tresa Endo to actually review her medication list to solidify what her home med list will be her blood pressure/diastolic heart failure control. This needs to be a explained to her in detail on discharge as to what these medications for. She feels that the medications are being adjusted by multiple physicians and gets quite confused.  I think she is stable to be transferred to telemetry.  But is not ready for discharge  Blood sugars are not adequately controlled -- her home 70/30 dose is 80, will need to adjust. & cover with SSI.  Will need to have this addressed by her PCP/DM MD as OP.  Glucotrol is not adequately controlling her PM sugars with the AM 70/30 (75/25). -- I will increase 70/30 to  70 Units for now & continue SSI  Discussed with Dr. Tresa Endo -- will need to streamline her med regimen -- d/c on ARB only, Bystolic (not + Verapamil), only 1 loop diuretic.   Will consider Ranexa as OP  To accurately adjust BP med regimen may take 1-2 days which is vital to ensure that she understands her regimen & is ready for d/c with low likelihood of bounceback.  15 minutes   Anothony Bursch W, M.D., M.S. [facility] [address] [place], [place]  [address]  [phone]  09/13/2012 9:17 AM

## 2012-09-14 LAB — CBC
HCT: 35.7 % — ABNORMAL LOW (ref 36.0–46.0)
MCH: 24.2 pg — ABNORMAL LOW (ref 26.0–34.0)
MCV: 77.1 fL — ABNORMAL LOW (ref 78.0–100.0)
RDW: 15.9 % — ABNORMAL HIGH (ref 11.5–15.5)
WBC: 9 10*3/uL (ref 4.0–10.5)

## 2012-09-14 LAB — GLUCOSE, CAPILLARY
Glucose-Capillary: 176 mg/dL — ABNORMAL HIGH (ref 70–99)
Glucose-Capillary: 179 mg/dL — ABNORMAL HIGH (ref 70–99)

## 2012-09-14 MED ORDER — FUROSEMIDE 40 MG PO TABS
40.0000 mg | ORAL_TABLET | Freq: Two times a day (BID) | ORAL | Status: DC
  Filled 2012-09-14 (×2): qty 1

## 2012-09-14 MED ORDER — FUROSEMIDE 40 MG PO TABS
40.0000 mg | ORAL_TABLET | Freq: Two times a day (BID) | ORAL | Status: DC
  Filled 2012-09-14 (×4): qty 1

## 2012-09-14 MED ORDER — FUROSEMIDE 10 MG/ML IJ SOLN
40.0000 mg | Freq: Once | INTRAMUSCULAR | Status: AC
  Administered 2012-09-14: 40 mg via INTRAVENOUS

## 2012-09-14 NOTE — Progress Notes (Signed)
Patient refused to wear cpap tonight. RT will continue to monitor. 

## 2012-09-14 NOTE — Progress Notes (Signed)
Rounding on patient when patient stated "I just don't feel good. I feel really dizzy, like I'm going to pass out." Initial BP: 97/66, but after 5 minutes of sitting BP: 134/76, other vitals WDL. Upon checking telemetry, it appeared patient had a rhythm change to afib, which patient states she has a history of, but upon further assessment of two other RNs it was thought patient may be in an accelerated third degree heart block. Wilburt Finlay, PA on call for [facility] paged and made aware. Order given to obtain 12 lead EKG and call with results.  Results given to [facility], waiting for further orders. Will continue to monitor patient closely.

## 2012-09-14 NOTE — Progress Notes (Signed)
Pt's CBG this am 126 - order for 70 units 70/30 as well as 2 units Novolog.  Pt stated "if i was at home, I would only take 20 units 70/30 since my sugar is 126 and see what it does." 20 units of 70/30 given to pt per her request as well as 2 units novolog.  Will speak with rounding physician regarding this matter.

## 2012-09-14 NOTE — Progress Notes (Signed)
Currently patient states "I feel better, just want to rest." BP holding in 130s systolic.  Updated by Wilburt Finlay, adjusting patients meds and confirmed pt was not in a heart block, but an escape rhythm.  Will continue to monitor closely.

## 2012-09-14 NOTE — Progress Notes (Signed)
[facility]  Subjective: Feeling better.  Objective: Vital signs in last 24 hours: Temp:  [97.5 F (36.4 C)-98.7 F (37.1 C)] 97.5 F (36.4 C) (10/27 0500) Pulse Rate:  [57-75] 67  (10/27 0500) Resp:  [18-20] 18  (10/27 0500) BP: (134-165)/(21-89) 149/79 mmHg (10/27 0500) SpO2:  [97 %-100 %] 99 % (10/27 0500) Weight:  [102.5 kg (225 lb 15.5 oz)] 102.5 kg (225 lb 15.5 oz) (10/27 0500) Last BM Date: 09/10/12  Intake/Output from previous day: 10/26 0701 - 10/27 0700 In: 281.3 [P.O.:240; I.V.:41.3] Out: 1200 [Urine:1200] Intake/Output this shift: Total I/O In: 240 [P.O.:240] Out: -   Medications Current Facility-Administered Medications  Medication Dose Route Frequency Provider Last Rate Last Dose  . 0.9 %  sodium chloride infusion   Intravenous Continuous Runell Gess, MD      . acetaminophen (TYLENOL) tablet 650 mg  650 mg Oral Q4H PRN Mihai Croitoru, MD      . ALPRAZolam Prudy Feeler) tablet 0.25 mg  0.25 mg Oral BID PRN Abelino Derrick, PA      . aspirin EC tablet 81 mg  81 mg Oral Daily [address] [place], [place]   81 mg at 09/14/12 1006  . atorvastatin (LIPITOR) tablet 80 mg  80 mg Oral [address] [place], [place]   80 mg at 09/14/12 1006  . clopidogrel (PLAVIX) tablet 75 mg  75 mg Oral Daily Eda Paschal [employer], [place]   75 mg at 09/14/12 1005  . ezetimibe (ZETIA) tablet 10 mg  10 mg Oral Daily Eda Paschal [place], [place]   10 mg at 09/14/12 1006  . famotidine (PEPCID) tablet 20 mg  20 mg Oral BID Eda Paschal [place], PA   20 mg at 09/14/12 1006  . ferrous sulfate tablet 325 mg  325 mg Oral Daily Eda Paschal [place], [place]   325 mg at 09/14/12 1006  . Fluticasone-Salmeterol (ADVAIR) 250-50 MCG/DOSE inhaler 1 puff  1 puff Inhalation BID Abelino Derrick, PA   1 puff at 09/14/12 1013  . furosemide (LASIX) injection 40 mg  40 mg Intravenous BID Marykay Lex, MD   40 mg at 09/14/12 1006  . glipiZIDE (GLUCOTROL XL) 24 hr tablet 10 mg  10 mg Oral BID WC Abelino Derrick, PA   10 mg at 09/14/12 0813    . hydrALAZINE (APRESOLINE) injection 10 mg  10 mg Intravenous Q6H PRN Wilburt Finlay, PA   10 mg at 09/13/12 0359  . HYDROcodone-acetaminophen (NORCO/VICODIN) 5-325 MG per tablet 1-2 tablet  1-2 tablet Oral Q6H PRN Abelino Derrick, PA      . insulin aspart (novoLOG) injection 0-15 Units  0-15 Units Subcutaneous TID [facility] Eda Paschal [place], [place]   2 Units at 09/14/12 [phone]  . insulin aspart (novoLOG) injection 0-5 Units  0-5 Units Subcutaneous QHS Eda Paschal [place], [place]   5 Units at 09/12/12 2220  . insulin aspart protamine-insulin aspart (NOVOLOG 70/30) injection 70 Units  70 Units Subcutaneous BID WC Marykay Lex, MD   20 Units at 09/14/12 [phone]  . irbesartan (AVAPRO) tablet 150 mg  150 mg Oral Daily Eda Paschal [place], [place]   150 mg at 09/14/12 1006  . levalbuterol (XOPENEX) nebulizer solution 0.63 mg  0.63 mg Nebulization Q8H PRN Wilburt Finlay, PA      . loratadine (CLARITIN) tablet 5 mg  5 mg Oral BID Runell Gess, MD   5 mg at 09/14/12 1006  . magnesium hydroxide (MILK OF MAGNESIA)  suspension 30 mL  30 mL Oral Daily PRN Runell Gess, MD   30 mL at 09/13/12 2151  . nebivolol (BYSTOLIC) tablet 10 mg  10 mg Oral Once Marykay Lex, MD   10 mg at 09/13/12 1046  . nebivolol (BYSTOLIC) tablet 40 mg  40 mg Oral Daily Marykay Lex, MD   40 mg at 09/14/12 1006  . niacin CR capsule 1,000 mg  1,000 mg Oral QHS Runell Gess, MD   1,000 mg at 09/13/12 2132  . nitroGLYCERIN (NITROSTAT) SL tablet 0.4 mg  0.4 mg Sublingual Q5 Min x 3 PRN Abelino Derrick, PA   0.4 mg at 09/13/12 2112  . nitroGLYCERIN 0.2 mg/mL in dextrose 5 % infusion  5 mcg/min Intravenous Titrated Abelino Derrick, PA   20 mcg/min at 09/13/12 0600  . ondansetron (ZOFRAN) injection 4 mg  4 mg Intravenous Q6H PRN Mihai Croitoru, MD      . oxyCODONE-acetaminophen (PERCOCET/ROXICET) 5-325 MG per tablet 1-2 tablet  1-2 tablet Oral Q4H PRN Mihai Croitoru, MD      . potassium chloride SA (K-DUR,KLOR-CON) CR tablet 20 mEq  20 mEq Oral Daily Runell Gess, MD    20 mEq at 09/14/12 1006  . ranolazine (RANEXA) 12 hr tablet 500 mg  500 mg Oral BID Abelino Derrick, PA   500 mg at 09/14/12 1005  . sulfamethoxazole-trimethoprim (BACTRIM DS) 800-160 MG per tablet 1 tablet  1 tablet Oral BID Abelino Derrick, PA   1 tablet at 09/14/12 1005  . verapamil (CALAN-SR) CR tablet 120 mg  120 mg Oral Daily Eda Paschal [place], [place]   120 mg at 09/14/12 1005  . zolpidem (AMBIEN) tablet 5 mg  5 mg Oral QHS PRN Abelino Derrick, PA        PE: General appearance: alert, cooperative and no distress Lungs: clear to auscultation bilaterally Heart: regular rate and rhythm, S1, S2 normal, no murmur, click, rub or gallop Extremities: No LEE Pulses: 2+ and symmetric Skin: No LEE Neurologic: Grossly normal  Lab Results:   Basename 09/14/12 0506 09/13/12 0455 09/12/12 0455  WBC 9.0 7.2 7.5  HGB 11.2* 10.6* 10.8*  HCT 35.7* 34.5* 34.8*  PLT 188 171 179   BMET  Basename 09/12/12 0455 09/11/12 1618  NA 140 139  K 4.0 4.3  CL 105 102  CO2 26 28  GLUCOSE 166* 182*  BUN 22 27*  CREATININE 1.17* 1.22*  CALCIUM 9.1 9.7   PT/INR  Basename 09/11/12 1618  LABPROT 13.8  INR 1.07   Assessment/Plan   Active Problems:  Diabetes mellitus  HYPERLIPIDEMIA  OBESITY  HYPERTENSION  CAD, prior LAD/RCA/CFX stents, cath 09/12/12 with patent stents  HYPERTENSION, PULMONARY - partially due to LV diastolic dysfunction  DIASTOLIC DYSFUNCTION, normal LVF  BRONCHITIS, CHRONIC  COPD  SLEEP APNEA, on C-pap  Unstable angina  Acute on chronic diastolic heart failure - with SSx of Unstable Angina  Plan:   1.  Net fluids -0.53ml(several unmeasured urine).  Still on IV lasix.  Will switch to PO.  I discussed daily weights and reduced Na intake.    2.  A1C 10.2.  Glucose uncontrolled. Will need OP followup.  3.  Likely DC home tomorrow.   LOS: 3 days    HAGER, BRYAN 09/14/2012 10:37 AM  I have seen & examined the patient this Am & agree with Mr. Jasper Riling findings, exam &  recommendations.  Improving Sx of HF -- no more CP.  Decent, yet not impressive diuresis overnight.  BP relatively well controlled -- too soon to titrate medications, as current doses only 1 day old.  Will need standing lasix or torsemide on d/c (will need to simplify her regimen).  Glucose levels are better -- with high A1C, needs closer OP monitoring.  Agree with at least 1 more day.  May actually give 1 more dose IV Lasix along with PO dose this PM.  Marykay Lex, M.D., M.S. [facility] [address] [place], [place]  [address]  [phone] Pager # [phone] 09/14/2012 12:13 PM

## 2012-09-14 NOTE — Progress Notes (Signed)
Placed patient on Auto-CPAP with minimum pressure set at 4cm with maximum pressure at at 20cm. Oxygen set at 2lpm,will continue to monitor patient

## 2012-09-15 ENCOUNTER — Ambulatory Visit (HOSPITAL_COMMUNITY): Payer: Medicare Other

## 2012-09-15 ENCOUNTER — Inpatient Hospital Stay (HOSPITAL_COMMUNITY): Payer: Medicare Other

## 2012-09-15 LAB — GLUCOSE, CAPILLARY
Glucose-Capillary: 108 mg/dL — ABNORMAL HIGH (ref 70–99)
Glucose-Capillary: 60 mg/dL — ABNORMAL LOW (ref 70–99)
Glucose-Capillary: 143 mg/dL — ABNORMAL HIGH (ref 70–99)
Glucose-Capillary: 121 mg/dL — ABNORMAL HIGH (ref 70–99)

## 2012-09-15 LAB — BASIC METABOLIC PANEL
BUN: 31 mg/dL — ABNORMAL HIGH (ref 6–23)
Chloride: 102 mEq/L (ref 96–112)
GFR calc Af Amer: 25 mL/min — ABNORMAL LOW (ref >90)
GFR calc non Af Amer: 21 mL/min — ABNORMAL LOW (ref >90)
Glucose, Bld: 179 mg/dL — ABNORMAL HIGH (ref 70–99)
Potassium: 4.6 mEq/L (ref 3.5–5.1)
Sodium: 139 mEq/L (ref 135–145)

## 2012-09-15 LAB — URINALYSIS, ROUTINE W REFLEX MICROSCOPIC
Bilirubin Urine: NEGATIVE
Glucose, UA: NEGATIVE mg/dL
Ketones, ur: NEGATIVE mg/dL
Protein, ur: 100 mg/dL — AB
pH: 6.5 (ref 5.0–8.0)

## 2012-09-15 LAB — URINE MICROSCOPIC-ADD ON

## 2012-09-15 LAB — CBC
HCT: 37.2 % (ref 36.0–46.0)
MCHC: 31.2 g/dL (ref 30.0–36.0)
MCV: 76.2 fL — ABNORMAL LOW (ref 78.0–100.0)
RDW: 15.7 % — ABNORMAL HIGH (ref 11.5–15.5)

## 2012-09-15 MED ORDER — POLYETHYLENE GLYCOL 3350 17 G PO PACK
17.0000 g | PACK | Freq: Every day | ORAL | Status: DC
  Administered 2012-09-15 – 2012-09-19 (×5): 17 g via ORAL
  Filled 2012-09-15 (×5): qty 1

## 2012-09-15 MED ORDER — LACTULOSE 10 GM/15ML PO SOLN
20.0000 g | Freq: Once | ORAL | Status: AC
  Administered 2012-09-15: 20 g via ORAL
  Filled 2012-09-15: qty 30

## 2012-09-15 MED ORDER — DOCUSATE SODIUM 100 MG PO CAPS
100.0000 mg | ORAL_CAPSULE | Freq: Two times a day (BID) | ORAL | Status: DC
  Administered 2012-09-15 – 2012-09-19 (×9): 100 mg via ORAL
  Filled 2012-09-15 (×10): qty 1

## 2012-09-15 NOTE — Progress Notes (Signed)
CARDIAC REHAB PHASE I   PRE:  Rate/Rhythm: 73 SR    BP: sitting 162/76    SaO2: 100 3L   MODE:  Ambulation: 240 ft   POST:  Rate/Rhythm: 76    BP: sitting 166/92     SaO2: 100 2L  Assist x2 with RW and 2L (what she wears at home). Fairly steady, x1 LOB. Gets DOE with x3 rest breaks. SaO2 good on 2L. Doesn't seem pt is very active at home although she sts she does her own grocery shopping. Sts she feels SOB. Will f/u. [phone]  Harriet Masson CES, ACSM

## 2012-09-15 NOTE — Progress Notes (Signed)
Subjective: Arrythmia last pm, during the time frame of ? High grade AV block with junct escape rhythm pt was dizzy and BP dropped the 90's systolic.  Once BP improved/HR stablized  Today continues with SOB that is worse now than her normal.  She does wear 02 at home. [place] no BM since last Wed. Objective: Vital signs in last 24 hours: Temp:  [97.5 F (36.4 C)-98 F (36.7 C)] 97.8 F (36.6 C) (10/28 0500) Pulse Rate:  [55-66] 66  (10/28 0500) Resp:  [18-20] 20  (10/28 0500) BP: (97-155)/(66-92) 146/76 mmHg (10/28 0500) SpO2:  [98 %-100 %] 98 % (10/28 0500) Weight:  [103 kg (227 lb 1.2 oz)] 103 kg (227 lb 1.2 oz) (10/28 0500) Weight change: 0.5 kg (1 lb 1.6 oz) Last BM Date: 09/10/12 Intake/Output from previous day:-720 10/27 0701 - 10/28 0700 In: 480 [P.O.:480] Out: 1200 [Urine:1200] Intake/Output this shift:    PE: General:alert and oriented, complains of constipation Heart:S1S2 RRR, no further arrhythmias Lungs:clear without rales, rhonchi or wheezes Abd:+ BS, soft non tender Ext:no edema    Lab Results:  Basename 09/15/12 0521 09/14/12 0506  WBC 9.2 9.0  HGB 11.6* 11.2*  HCT 37.2 35.7*  PLT 197 188   BMET  Basename 09/15/12 0521  NA 139  K 4.6  CL 102  CO2 26  GLUCOSE 179*  BUN 31*  CREATININE 2.15*  CALCIUM 9.2   No results found for this basename: TROPONINI:2,CK,MB:2 in the last 72 hours  Lab Results  Component Value Date   CHOL 147 03/01/2009   HDL 33 03/01/2009   LDLCALC 92 03/01/2009   TRIG 166 03/01/2009   CHOLHDL 4.1 03/06/2008   Lab Results  Component Value Date   HGBA1C 10.2* 09/11/2012     Lab Results  Component Value Date   TSH 1.571 09/11/2012      EKG: from yesterday (10/27) Profound Sinus Brady with Junctional escape beats/rhythm.  Studies/Results: No results found.  Medications: I have reviewed the patient's current medications.    Marland Kitchen aspirin EC  81 mg Oral Daily  . atorvastatin  80 mg Oral q1800  . clopidogrel  75 mg Oral  Daily  . ezetimibe  10 mg Oral Daily  . famotidine  20 mg Oral BID  . ferrous sulfate  325 mg Oral Daily  . Fluticasone-Salmeterol  1 puff Inhalation BID  . furosemide  40 mg Intravenous Once  . furosemide  40 mg Oral BID  . glipiZIDE  10 mg Oral BID WC  . insulin aspart  0-15 Units Subcutaneous TID WC  . insulin aspart  0-5 Units Subcutaneous QHS  . insulin aspart protamine-insulin aspart  70 Units Subcutaneous BID WC  . irbesartan  150 mg Oral Daily  . loratadine  5 mg Oral BID  . nebivolol  40 mg Oral Daily  . niacin  1,000 mg Oral QHS  . potassium chloride  20 mEq Oral Daily  . ranolazine  500 mg Oral BID  . sulfamethoxazole-trimethoprim  1 tablet Oral BID  . verapamil  120 mg Oral Daily  . DISCONTD: furosemide  40 mg Intravenous BID  . DISCONTD: furosemide  40 mg Oral BID   Assessment/Plan: Principal Problem:  *Acute on chronic diastolic heart failure - with SSx of Unstable Angina Active Problems:  Diabetes mellitus  HYPERLIPIDEMIA  OBESITY  HYPERTENSION  CAD, prior LAD/RCA/CFX stents, cath 09/12/12 with patent stents  HYPERTENSION, PULMONARY - partially due to LV diastolic dysfunction  DIASTOLIC DYSFUNCTION, normal LVF  BRONCHITIS, CHRONIC  COPD  SLEEP APNEA, on C-pap  Unstable angina  PLAN: Cr. Up from 1.17 on the 25th to 2.15 today.  Will hold lasix. Holding Verapamil for now due to arrhythmia yesterday.  Ambulate with cardiac rehab.  Keep today, recheck labs in am.  Evaluate dyspnea off lasix today.  She has already rec'd ARB.   Will d/c bactrim  Pt denies taking med prior to admit.   LOS: 4 days   INGOLD,LAURA R 09/15/2012, 9:22 AM  I have seen & examined the patient today along with Ms. Ingold.  I agree with her note.  Unusual rhythm change yesterday -- also became hypotensive -- Verapamil D/C'd.  Will need to monitor today & tonite to ensure no further episodes of this rhythm  Acute Renal failure with Cr up to 2.15 from 1.17 -- in setting of diuresis  (~740ml only out in past 24 hrs -- would not expect that dramatic a change in renal Fxn) -- likely combination of diuresis + arrythmia related hypotension ATN.  Will need to see this trending back to normal prior to d/c.  BP has improved.  Continues to complain of dyspnea - only potential cardiac etiology is diastolic dysfunction, which should be improved with diuresis & BP control.  Obesity is probably the major driving force with external restrictive lung disease.  Keep inpatient overnight -- if Cr stable & BP / HR stable, anticipate d/c tomorrow.   Marykay Lex, M.D., M.S. [facility] [address] [place], [place]  [address]  [phone] Pager # [phone] 09/15/2012 1:24 PM

## 2012-09-15 NOTE — Progress Notes (Signed)
Hypoglycemic Event  CBG: 60  Treatment: 15 GM carbohydrate snack  Symptoms: Shaky  Follow-up CBG: Time:0220 CBG Result:108  Possible Reasons for Event: Unknown  Comments/MD notified:Pt BS up to 108 after snack, and stated feels better    [name]  Remember to initiate Hypoglycemia Order Set & complete

## 2012-09-15 NOTE — Progress Notes (Signed)
Pt stated she needed her blood sugar checked because she felt "shaky". Blood sugar was taken and it was 60, pt given 15g carbohydrate snack. Will reassess blood sugar.

## 2012-09-16 LAB — BASIC METABOLIC PANEL
CO2: 28 mEq/L (ref 19–32)
Chloride: 105 mEq/L (ref 96–112)
GFR calc non Af Amer: 22 mL/min — ABNORMAL LOW (ref >90)
Glucose, Bld: 94 mg/dL (ref 70–99)
Potassium: 4.5 mEq/L (ref 3.5–5.1)
Sodium: 141 mEq/L (ref 135–145)

## 2012-09-16 LAB — CBC
HCT: 36.8 % (ref 36.0–46.0)
Hemoglobin: 11.6 g/dL — ABNORMAL LOW (ref 12.0–15.0)
RBC: 4.78 MIL/uL (ref 3.87–5.11)
WBC: 8.6 10*3/uL (ref 4.0–10.5)

## 2012-09-16 LAB — GLUCOSE, CAPILLARY

## 2012-09-16 MED ORDER — FAMOTIDINE 20 MG PO TABS
20.0000 mg | ORAL_TABLET | Freq: Every day | ORAL | Status: DC
  Administered 2012-09-17 – 2012-09-19 (×3): 20 mg via ORAL
  Filled 2012-09-16 (×3): qty 1

## 2012-09-16 NOTE — Progress Notes (Signed)
[facility]  Subjective: Breathing better.  Objective: Vital signs in last 24 hours: Temp:  [97.5 F (36.4 C)-98.4 F (36.9 C)] 98.4 F (36.9 C) (10/29 0500) Pulse Rate:  [66-68] 66  (10/29 0500) Resp:  [18-20] 18  (10/29 0500) BP: (126-137)/(63-82) 126/63 mmHg (10/29 0500) SpO2:  [98 %-100 %] 100 % (10/29 0500) Last BM Date: 09/10/12  Intake/Output from previous day:   Intake/Output this shift:    Medications Current Facility-Administered Medications  Medication Dose Route Frequency Provider Last Rate Last Dose  . 0.9 %  sodium chloride infusion   Intravenous Continuous Runell Gess, MD      . acetaminophen (TYLENOL) tablet 650 mg  650 mg Oral Q4H PRN Aamir Mclinden, MD      . ALPRAZolam Prudy Feeler) tablet 0.25 mg  0.25 mg Oral BID PRN Abelino Derrick, PA      . aspirin EC tablet 81 mg  81 mg Oral Daily Eda Paschal [place], [place]   81 mg at 09/15/12 0950  . atorvastatin (LIPITOR) tablet 80 mg  80 mg Oral [address] [employer], [place]   80 mg at 09/15/12 0950  . clopidogrel (PLAVIX) tablet 75 mg  75 mg Oral Daily Eda Paschal [place], [place]   75 mg at 09/15/12 2130  . docusate sodium (COLACE) capsule 100 mg  100 mg Oral BID Nada Boozer, NP   100 mg at 09/15/12 2209  . ezetimibe (ZETIA) tablet 10 mg  10 mg Oral Daily Eda Paschal [place], [place]   10 mg at 09/15/12 0950  . famotidine (PEPCID) tablet 20 mg  20 mg Oral BID Eda Paschal [place], [place]   20 mg at 09/15/12 2209  . ferrous sulfate tablet 325 mg  325 mg Oral Daily Eda Paschal [place], [place]   325 mg at 09/15/12 0950  . Fluticasone-Salmeterol (ADVAIR) 250-50 MCG/DOSE inhaler 1 puff  1 puff Inhalation BID Eda Paschal [place], PA   1 puff at 09/16/12 0901  . glipiZIDE (GLUCOTROL XL) 24 hr tablet 10 mg  10 mg Oral BID WC Abelino Derrick, PA   10 mg at 09/15/12 1652  . hydrALAZINE (APRESOLINE) injection 10 mg  10 mg Intravenous Q6H PRN Wilburt Finlay, PA   10 mg at 09/13/12 0359  . HYDROcodone-acetaminophen (NORCO/VICODIN) 5-325 MG per tablet 1-2 tablet  1-2  tablet Oral Q6H PRN Abelino Derrick, PA      . insulin aspart (novoLOG) injection 0-15 Units  0-15 Units Subcutaneous TID WC Abelino Derrick, PA   2 Units at 09/15/12 1653  . insulin aspart (novoLOG) injection 0-5 Units  0-5 Units Subcutaneous QHS Eda Paschal [place], [place]   5 Units at 09/12/12 2220  . insulin aspart protamine-insulin aspart (NOVOLOG 70/30) injection 70 Units  70 Units Subcutaneous BID WC Marykay Lex, MD   40 Units at 09/15/12 1653  . irbesartan (AVAPRO) tablet 150 mg  150 mg Oral Daily Eda Paschal [place], [place]   150 mg at 09/15/12 0950  . lactulose (CHRONULAC) 10 GM/15ML solution 20 g  20 g Oral Once Nada Boozer, NP   20 g at 09/15/12 1209  . levalbuterol (XOPENEX) nebulizer solution 0.63 mg  0.63 mg Nebulization Q8H PRN Wilburt Finlay, PA   0.63 mg at 09/14/12 1751  . loratadine (CLARITIN) tablet 5 mg  5 mg Oral BID Runell Gess, MD   5 mg at 09/15/12 2210  . magnesium hydroxide (MILK OF MAGNESIA) suspension 30 mL  30 mL  Oral Daily PRN Runell Gess, MD   30 mL at 09/13/12 2151  . nebivolol (BYSTOLIC) tablet 40 mg  40 mg Oral Daily Wilburt Finlay, PA   40 mg at 09/15/12 0950  . niacin CR capsule 1,000 mg  1,000 mg Oral QHS Runell Gess, MD   1,000 mg at 09/15/12 2209  . nitroGLYCERIN (NITROSTAT) SL tablet 0.4 mg  0.4 mg Sublingual Q5 Min x 3 PRN Abelino Derrick, PA   0.4 mg at 09/13/12 2112  . nitroGLYCERIN 0.2 mg/mL in dextrose 5 % infusion  5 mcg/min Intravenous Titrated Abelino Derrick, PA   20 mcg/min at 09/13/12 0600  . ondansetron (ZOFRAN) injection 4 mg  4 mg Intravenous Q6H PRN Murel Wigle, MD      . oxyCODONE-acetaminophen (PERCOCET/ROXICET) 5-325 MG per tablet 1-2 tablet  1-2 tablet Oral Q4H PRN Massiah Minjares, MD      . polyethylene glycol (MIRALAX / GLYCOLAX) packet 17 g  17 g Oral Daily Nada Boozer, NP   17 g at 09/15/12 1045  . potassium chloride SA (K-DUR,KLOR-CON) CR tablet 20 mEq  20 mEq Oral Daily Runell Gess, MD   20 mEq at 09/15/12 0950  . ranolazine (RANEXA) 12 hr  tablet 500 mg  500 mg Oral BID Eda Paschal [place], PA   500 mg at 09/15/12 2209  . zolpidem (AMBIEN) tablet 5 mg  5 mg Oral QHS PRN Abelino Derrick, PA      . DISCONTD: furosemide (LASIX) tablet 40 mg  40 mg Oral BID Wilburt Finlay, PA      . DISCONTD: sulfamethoxazole-trimethoprim (BACTRIM DS) 800-160 MG per tablet 1 tablet  1 tablet Oral BID Abelino Derrick, PA   1 tablet at 09/15/12 0953  . DISCONTD: verapamil (CALAN-SR) CR tablet 120 mg  120 mg Oral Daily Wilburt Finlay, PA   120 mg at 09/14/12 1005    PE: General appearance: alert, cooperative and no distress Lungs: clear to auscultation bilaterally Heart: regular rate and rhythm, S1, S2 normal, no murmur, click, rub or gallop Extremities: 1+ LEE Pulses: 2+ and symmetric Neurologic: Grossly normal  Lab Results:   Basename 09/16/12 0554 09/15/12 0521 09/14/12 0506  WBC 8.6 9.2 9.0  HGB 11.6* 11.6* 11.2*  HCT 36.8 37.2 35.7*  PLT 209 197 188   BMET  Basename 09/16/12 0554 09/15/12 0521  NA 141 139  K 4.5 4.6  CL 105 102  CO2 28 26  GLUCOSE 94 179*  BUN 28* 31*  CREATININE 2.08* 2.15*  CALCIUM 9.2 9.2   Assessment/Plan  Principal Problem:  *Acute on chronic diastolic heart failure - with SSx of Unstable Angina Active Problems:  Diabetes mellitus  HYPERLIPIDEMIA  OBESITY  HYPERTENSION  CAD, prior LAD/RCA/CFX stents, cath 09/12/12 with patent stents  HYPERTENSION, PULMONARY - partially due to LV diastolic dysfunction  DIASTOLIC DYSFUNCTION, normal LVF  BRONCHITIS, CHRONIC  COPD  SLEEP APNEA, on C-pap  Unstable angina  Plan:  Maintaining NSR.  Some NSVT ~7beat on telemetry.  Junctional escape seen from Saturday evening not apparent any more.  Minimal improvement today in SCr.  BP and HR stable.  Verapamil DCd.  Will DC the ARB with kidney function where it is.  Consider Imdur or hydralazine for afterload.Can follow SCr as outpat.  She also needs a 30 day Cardionet monitor.     LOS: 5 days    HAGER, BRYAN 09/16/2012 9:20  AM  I have seen and examined the patient along with  Wilburt Finlay, PA.  I have reviewed the chart, notes and new data.  I agree with PA's note.  Worsening kidney function despite weight gain suggests a component of contrast nephrotoxicity. Creat seems to have peaked and is starting to trend down. Agree with holding ARB. Wait another 24 h until restarting diuretics. BP is great right now, will start hydr/nitrates if BP starts to rise and we cannot yet restart ARB. Keep inpatient another 24-48 hours depending on renal function.  Thurmon Fair, MD, [facility] [phone] 09/16/2012, 11:57 AM

## 2012-09-16 NOTE — Progress Notes (Signed)
CARDIAC REHAB PHASE I   SR PRE:  Rate/Rhythm: 69 SR/    BP: sitting 130/60    SaO2: 100 2L  MODE:  Ambulation: 200 ft   POST:  Rate/Rhythm: 76    BP: sitting 130/60     SaO2: 98 2L  Steady using RW assist x1 with 2L. SOB. Required x3 rest stops. Sts SOB worse when she stops/sits down. VSS.  [id number]  Elissa Lovett [place] CES, ACSM

## 2012-09-17 LAB — BASIC METABOLIC PANEL
Calcium: 9.1 mg/dL (ref 8.4–10.5)
Creatinine, Ser: 1.85 mg/dL — ABNORMAL HIGH (ref 0.50–1.10)
GFR calc Af Amer: 30 mL/min — ABNORMAL LOW (ref >90)
Glucose, Bld: 253 mg/dL — ABNORMAL HIGH (ref 70–99)
Potassium: 5.2 mEq/L — ABNORMAL HIGH (ref 3.5–5.1)
Sodium: 138 mEq/L (ref 135–145)

## 2012-09-17 LAB — GLUCOSE, CAPILLARY
Glucose-Capillary: 114 mg/dL — ABNORMAL HIGH (ref 70–99)
Glucose-Capillary: 106 mg/dL — ABNORMAL HIGH (ref 70–99)

## 2012-09-17 LAB — CBC
HCT: 36.4 % (ref 36.0–46.0)
Hemoglobin: 11.2 g/dL — ABNORMAL LOW (ref 12.0–15.0)
MCHC: 30.8 g/dL (ref 30.0–36.0)
MCV: 77.8 fL — ABNORMAL LOW (ref 78.0–100.0)

## 2012-09-17 NOTE — Progress Notes (Signed)
[facility]  Patient Status: New  [facility] is providing the following services: RN  If patient discharges after hours, please call [phone].   Wynelle Bourgeois 09/17/2012, 7:24 PM

## 2012-09-17 NOTE — Progress Notes (Signed)
[facility]  DAILY PROGRESS NOTE   Subjective:  No events noted overnight. Creatinine is pending today.  She feels well.  Objective:  Temp:  [98.4 F (36.9 C)-98.9 F (37.2 C)] 98.4 F (36.9 C) (10/30 0500) Pulse Rate:  [65-68] 66  (10/30 0500) Resp:  [18-20] 20  (10/30 0500) BP: (139-155)/(67-75) 139/75 mmHg (10/30 0500) SpO2:  [100 %] 100 % (10/30 0500) Weight change:   Intake/Output from previous day: 10/29 0701 - 10/30 0700 In: 480 [P.O.:480] Out: -   Intake/Output from this shift: Total I/O In: 120 [P.O.:120] Out: -   Medications: Current Facility-Administered Medications  Medication Dose Route Frequency Provider Last Rate Last Dose  . 0.9 %  sodium chloride infusion   Intravenous Continuous Runell Gess, MD      . acetaminophen (TYLENOL) tablet 650 mg  650 mg Oral Q4H PRN Mihai Croitoru, MD      . ALPRAZolam Prudy Feeler) tablet 0.25 mg  0.25 mg Oral BID PRN Abelino Derrick, PA      . aspirin EC tablet 81 mg  81 mg Oral Daily [address] [place], [place]   81 mg at 09/16/12 1029  . atorvastatin (LIPITOR) tablet 80 mg  80 mg Oral [address] [place], [place]   80 mg at 09/16/12 1829  . clopidogrel (PLAVIX) tablet 75 mg  75 mg Oral Daily Eda Paschal [place], [place]   75 mg at 09/16/12 1032  . docusate sodium (COLACE) capsule 100 mg  100 mg Oral BID Nada Boozer, NP   100 mg at 09/16/12 2142  . ezetimibe (ZETIA) tablet 10 mg  10 mg Oral Daily Eda Paschal [place], [place]   10 mg at 09/16/12 1029  . famotidine (PEPCID) tablet 20 mg  20 mg Oral Daily Abelino Derrick, [place]      . ferrous sulfate tablet 325 mg  325 mg Oral Daily Eda Paschal [place], [place]   325 mg at 09/16/12 1029  . Fluticasone-Salmeterol (ADVAIR) 250-50 MCG/DOSE inhaler 1 puff  1 puff Inhalation BID Eda Paschal [place], [place]   1 puff at 09/16/12 2133  . glipiZIDE (GLUCOTROL XL) 24 hr tablet 10 mg  10 mg Oral BID WC Abelino Derrick, PA   10 mg at 09/17/12 1610  . hydrALAZINE (APRESOLINE) injection 10 mg  10 mg Intravenous Q6H PRN Wilburt Finlay, PA   10 mg at 09/13/12 0359  . HYDROcodone-acetaminophen (NORCO/VICODIN) 5-325 MG per tablet 1-2 tablet  1-2 tablet Oral Q6H PRN Abelino Derrick, PA      . insulin aspart (novoLOG) injection 0-15 Units  0-15 Units Subcutaneous TID WC Eda Paschal [place], PA   3 Units at 09/16/12 1830  . insulin aspart (novoLOG) injection 0-5 Units  0-5 Units Subcutaneous QHS Eda Paschal [place], [place]   5 Units at 09/12/12 2220  . insulin aspart protamine-insulin aspart (NOVOLOG 70/30) injection 70 Units  70 Units Subcutaneous BID WC Marykay Lex, MD   70 Units at 09/17/12 [phone]  . levalbuterol (XOPENEX) nebulizer solution 0.63 mg  0.63 mg Nebulization Q8H PRN Wilburt Finlay, PA   0.63 mg at 09/14/12 1751  . loratadine (CLARITIN) tablet 5 mg  5 mg Oral BID Runell Gess, MD   5 mg at 09/16/12 2142  . magnesium hydroxide (MILK OF MAGNESIA) suspension 30 mL  30 mL Oral Daily PRN Runell Gess, MD   30 mL at 09/13/12 2151  . nebivolol (BYSTOLIC) tablet 40 mg  40 mg  Oral Daily Wilburt Finlay, PA   40 mg at 09/16/12 1029  . niacin CR capsule 1,000 mg  1,000 mg Oral QHS Runell Gess, MD   1,000 mg at 09/16/12 2142  . nitroGLYCERIN (NITROSTAT) SL tablet 0.4 mg  0.4 mg Sublingual Q5 Min x 3 PRN Abelino Derrick, PA   0.4 mg at 09/13/12 2112  . nitroGLYCERIN 0.2 mg/mL in dextrose 5 % infusion  5 mcg/min Intravenous Titrated Abelino Derrick, PA   20 mcg/min at 09/13/12 0600  . ondansetron (ZOFRAN) injection 4 mg  4 mg Intravenous Q6H PRN Mihai Croitoru, MD      . oxyCODONE-acetaminophen (PERCOCET/ROXICET) 5-325 MG per tablet 1-2 tablet  1-2 tablet Oral Q4H PRN Mihai Croitoru, MD      . polyethylene glycol (MIRALAX / GLYCOLAX) packet 17 g  17 g Oral Daily Nada Boozer, NP   17 g at 09/16/12 1030  . potassium chloride SA (K-DUR,KLOR-CON) CR tablet 20 mEq  20 mEq Oral Daily Runell Gess, MD   20 mEq at 09/16/12 1029  . ranolazine (RANEXA) 12 hr tablet 500 mg  500 mg Oral BID Eda Paschal [place], PA   500 mg at 09/16/12 2142  . zolpidem  (AMBIEN) tablet 5 mg  5 mg Oral QHS PRN Abelino Derrick, PA      . DISCONTD: famotidine (PEPCID) tablet 20 mg  20 mg Oral BID Abelino Derrick, PA   20 mg at 09/16/12 1029  . DISCONTD: irbesartan (AVAPRO) tablet 150 mg  150 mg Oral Daily Eda Paschal [place], [place]   150 mg at 09/15/12 1610    Physical Exam: General appearance: alert, no distress and on oxygen per nasal cannula Neck: no adenopathy, no carotid bruit, no JVD, supple, symmetrical, trachea midline and thyroid not enlarged, symmetric, no tenderness/mass/nodules Lungs: diminished breath sounds bibasilar Heart: regular rate and rhythm, S1, S2 normal, no murmur, click, rub or gallop Abdomen: soft, non-tender; bowel sounds normal; no masses,  no organomegaly Extremities: extremities normal, atraumatic, no cyanosis or edema Pulses: 2+ and symmetric Skin: Skin color, texture, turgor normal. No rashes or lesions Neurologic: Grossly normal  Lab Results: Results for orders placed during the hospital encounter of 09/11/12 (from the past 48 hour(s))  GLUCOSE, CAPILLARY     Status: Abnormal   Collection Time   09/15/12 12:04 PM      Component Value Range Comment   Glucose-Capillary 136 (*) 70 - 99 mg/dL    Comment 1 Notify RN     GLUCOSE, CAPILLARY     Status: Abnormal   Collection Time   09/15/12  4:34 PM      Component Value Range Comment   Glucose-Capillary 143 (*) 70 - 99 mg/dL    Comment 1 Notify RN     URINALYSIS, ROUTINE W REFLEX MICROSCOPIC     Status: Abnormal   Collection Time   09/15/12  7:05 PM      Component Value Range Comment   Color, Urine YELLOW  YELLOW    APPearance CLEAR  CLEAR    Specific Gravity, Urine 1.020  1.005 - 1.030    pH 6.5  5.0 - 8.0    Glucose, UA NEGATIVE  NEGATIVE mg/dL    Hgb urine dipstick NEGATIVE  NEGATIVE    Bilirubin Urine NEGATIVE  NEGATIVE    Ketones, ur NEGATIVE  NEGATIVE mg/dL    Protein, ur 960 (*) NEGATIVE mg/dL    Urobilinogen, UA 0.2  0.0 - 1.0 mg/dL  Nitrite NEGATIVE  NEGATIVE     Leukocytes, UA NEGATIVE  NEGATIVE   URINE MICROSCOPIC-ADD ON     Status: Abnormal   Collection Time   09/15/12  7:05 PM      Component Value Range Comment   Squamous Epithelial / LPF MANY (*) RARE    WBC, UA 0-2  <3 WBC/hpf    Bacteria, UA RARE  RARE    Urine-Other RARE YEAST     GLUCOSE, CAPILLARY     Status: Abnormal   Collection Time   09/15/12  9:16 PM      Component Value Range Comment   Glucose-Capillary 121 (*) 70 - 99 mg/dL    Comment 1 Notify RN     CBC     Status: Abnormal   Collection Time   09/16/12  5:54 AM      Component Value Range Comment   WBC 8.6  4.0 - 10.5 K/uL    RBC 4.78  3.87 - 5.11 MIL/uL    Hemoglobin 11.6 (*) 12.0 - 15.0 g/dL    HCT 16.1  09.6 - 04.5 %    MCV 77.0 (*) 78.0 - 100.0 fL    MCH 24.3 (*) 26.0 - 34.0 pg    MCHC 31.5  30.0 - 36.0 g/dL    RDW 40.9 (*) 81.1 - 15.5 %    Platelets 209  150 - 400 K/uL   BASIC METABOLIC PANEL     Status: Abnormal   Collection Time   09/16/12  5:54 AM      Component Value Range Comment   Sodium 141  135 - 145 mEq/L    Potassium 4.5  3.5 - 5.1 mEq/L    Chloride 105  96 - 112 mEq/L    CO2 28  19 - 32 mEq/L    Glucose, Bld 94  70 - 99 mg/dL    BUN 28 (*) 6 - 23 mg/dL    Creatinine, Ser 9.14 (*) 0.50 - 1.10 mg/dL    Calcium 9.2  8.4 - 78.2 mg/dL    GFR calc non Af Amer 22 (*) >90 mL/min    GFR calc Af Amer 26 (*) >90 mL/min   GLUCOSE, CAPILLARY     Status: Normal   Collection Time   09/16/12  7:39 AM      Component Value Range Comment   Glucose-Capillary 97  70 - 99 mg/dL    Comment 1 Notify RN     GLUCOSE, CAPILLARY     Status: Abnormal   Collection Time   09/16/12 11:29 AM      Component Value Range Comment   Glucose-Capillary 176 (*) 70 - 99 mg/dL    Comment 1 Notify RN     GLUCOSE, CAPILLARY     Status: Abnormal   Collection Time   09/16/12  4:59 PM      Component Value Range Comment   Glucose-Capillary 190 (*) 70 - 99 mg/dL    Comment 1 Notify RN     GLUCOSE, CAPILLARY     Status: Abnormal    Collection Time   09/16/12  9:03 PM      Component Value Range Comment   Glucose-Capillary 119 (*) 70 - 99 mg/dL    Comment 1 Notify RN     GLUCOSE, CAPILLARY     Status: Normal   Collection Time   09/17/12  1:56 AM      Component Value Range Comment   Glucose-Capillary 74  70 - 99  mg/dL   GLUCOSE, CAPILLARY     Status: Abnormal   Collection Time   09/17/12  2:22 AM      Component Value Range Comment   Glucose-Capillary 114 (*) 70 - 99 mg/dL    Comment 1 Notify RN     CBC     Status: Abnormal   Collection Time   09/17/12  5:56 AM      Component Value Range Comment   WBC 7.6  4.0 - 10.5 K/uL    RBC 4.68  3.87 - 5.11 MIL/uL    Hemoglobin 11.2 (*) 12.0 - 15.0 g/dL    HCT 95.2  84.1 - 32.4 %    MCV 77.8 (*) 78.0 - 100.0 fL    MCH 23.9 (*) 26.0 - 34.0 pg    MCHC 30.8  30.0 - 36.0 g/dL    RDW 40.1 (*) 02.7 - 15.5 %    Platelets 210  150 - 400 K/uL   GLUCOSE, CAPILLARY     Status: Abnormal   Collection Time   09/17/12  7:39 AM      Component Value Range Comment   Glucose-Capillary 106 (*) 70 - 99 mg/dL     Imaging: Dg Hip Complete Left  09/15/2012  *RADIOLOGY REPORT*  Clinical Data: Left hip pain  LEFT HIP - COMPLETE 2+ VIEW  Comparison: 07/22/2007  Findings: Negative for fracture.  Mild degenerative change in the hip joint bilaterally is similar to the prior study.  Negative for AVN.  No focal bony destruction.  IMPRESSION: Mild degenerative change in both hip joints.  No acute abnormality.   Original Report Authenticated By: Camelia Phenes, M.D.     Assessment:  1. Principal Problem: 2.  *Acute on chronic diastolic heart failure - with SSx of Unstable Angina 3. Active Problems: 4.  Diabetes mellitus 5.  HYPERLIPIDEMIA 6.  OBESITY 7.  HYPERTENSION 8.  CAD, prior LAD/RCA/CFX stents, cath 09/12/12 with patent stents 9.  HYPERTENSION, PULMONARY - partially due to LV diastolic dysfunction 10.  DIASTOLIC DYSFUNCTION, normal LVF 11.  BRONCHITIS, CHRONIC 12.  COPD 13.   SLEEP APNEA, on C-pap 14.  Unstable angina 15.   Plan:  1. Awaiting today's creatinine value. Renal function appears to be improving. May be able to restart low dose lasix today if creatinine improves. Hold on restarting bp meds until tomorrow - agree that Bidil would be a good choice if creatinine remains elevated and she remains hypertensive.  Time Spent Directly with Patient:  15 minutes  Length of Stay:  LOS: 6 days   Chrystie Nose, MD, [facility] Attending Cardiologist [facility]  Lotoya Casella C 09/17/2012, 8:52 AM

## 2012-09-17 NOTE — Progress Notes (Signed)
   CARE MANAGEMENT NOTE 09/17/2012  Patient:  Krystal [name], Krystal [name]   Account Number:  [id number]  Date Initiated:  09/11/2012  Documentation initiated by:  Junius Creamer  Subjective/Objective Assessment:   adm w ch pain     Action/Plan:   lives alone   Anticipated DC Date:     Anticipated DC Plan:        DC Planning Services  CM consult      Choice offered to / List presented to:          [facility] arranged  HH-1 RN  HH-10 DISEASE MANAGEMENT      HH agency  [facility]   Status of service:  Completed, signed off Medicare Important Message given?   (If response is "NO", the following Medicare IM given date fields will be blank) Date Medicare IM given:   Date Additional Medicare IM given:    Discharge Disposition:  HOME W HOME HEALTH SERVICES  Per UR Regulation:  Reviewed for med. necessity/level of care/duration of stay  If discussed at Long Length of Stay Meetings, dates discussed:    Comments:  09-17-12 [address] [place], [place] [phone] CM did speak to pt and she will benefit from [facility] for CHF management. Pt is agreeable to [facility] for  [facility] services. CM made referral  for hh services. SOC to begin within 24-48 hours post d/c. Pt states she will have transportation tomorrow for possible d/c home. Cm will continue to monitor.   10/24 15:49p debbie dowell rn,bsn [phone]

## 2012-09-17 NOTE — Progress Notes (Deleted)
Krystal Nose, MD Physician Signed Cardiology Progress Notes 09/17/2012 8:52 AM  [facility]  DAILY PROGRESS NOTE  Subjective:  No events noted overnight. Creatinine is pending today. She feels well.  Objective:  Temp: [98.4 F (36.9 C)-98.9 F (37.2 C)] 98.4 F (36.9 C) (10/30 0500)  Pulse Rate: [65-68] 66 (10/30 0500)  Resp: [18-20] 20 (10/30 0500)  BP: (139-155)/(67-75) 139/75 mmHg (10/30 0500)  SpO2: [100 %] 100 % (10/30 0500)  Weight change:  Intake/Output from previous day:  10/29 0701 - 10/30 0700  In: 480 [P.O.:480]  Out: -  Intake/Output from this shift:  Total I/O  In: 120 [P.O.:120]  Out: -  Medications:    Current Facility-Administered Medications    Medication  Dose  Route  Frequency  Provider  Last Rate  Last Dose    .  0.9 % sodium chloride infusion   Intravenous  Continuous  Runell Gess, MD      .  acetaminophen (TYLENOL) tablet 650 mg  650 mg  Oral  Q4H PRN  Mihai Croitoru, MD      .  ALPRAZolam Prudy Feeler) tablet 0.25 mg  0.25 mg  Oral  BID PRN  Abelino Derrick, PA      .  aspirin EC tablet 81 mg  81 mg  Oral  Daily  [address] [place], [place]   81 mg at 09/16/12 1029    .  atorvastatin (LIPITOR) tablet 80 mg  80 mg  Oral  [address] [place], [place]   80 mg at 09/16/12 1829    .  clopidogrel (PLAVIX) tablet 75 mg  75 mg  Oral  Daily  Eda Paschal [place], [place]   75 mg at 09/16/12 1032    .  docusate sodium (COLACE) capsule 100 mg  100 mg  Oral  BID  Nada Boozer, NP   100 mg at 09/16/12 2142    .  ezetimibe (ZETIA) tablet 10 mg  10 mg  Oral  Daily  Eda Paschal [place], [place]   10 mg at 09/16/12 1029    .  famotidine (PEPCID) tablet 20 mg  20 mg  Oral  Daily  Abelino Derrick, [place]      .  ferrous sulfate tablet 325 mg  325 mg  Oral  Daily  Eda Paschal [place], [place]   325 mg at 09/16/12 1029    .  Fluticasone-Salmeterol (ADVAIR) 250-50 MCG/DOSE inhaler 1 puff  1 puff  Inhalation  BID  Eda Paschal [place], [place]   1 puff at 09/16/12 2133    .  glipiZIDE (GLUCOTROL XL) 24 hr tablet  10 mg  10 mg  Oral  BID WC  Abelino Derrick, PA   10 mg at 09/17/12 4098    .  hydrALAZINE (APRESOLINE) injection 10 mg  10 mg  Intravenous  Q6H PRN  Wilburt Finlay, PA   10 mg at 09/13/12 0359    .  HYDROcodone-acetaminophen (NORCO/VICODIN) 5-325 MG per tablet 1-2 tablet  1-2 tablet  Oral  Q6H PRN  Abelino Derrick, PA      .  insulin aspart (novoLOG) injection 0-15 Units  0-15 Units  Subcutaneous  TID WC  Eda Paschal [place], PA   3 Units at 09/16/12 1830    .  insulin aspart (novoLOG) injection 0-5 Units  0-5 Units  Subcutaneous  QHS  Eda Paschal [place], [place]   5 Units at 09/12/12 2220    .  insulin aspart protamine-insulin aspart (NOVOLOG 70/30) injection 70 Units  70 Units  Subcutaneous  BID WC  Marykay Lex, MD   70 Units at 09/17/12 [phone]    .  levalbuterol (XOPENEX) nebulizer solution 0.63 mg  0.63 mg  Nebulization  Q8H PRN  Wilburt Finlay, PA   0.63 mg at 09/14/12 1751    .  loratadine (CLARITIN) tablet 5 mg  5 mg  Oral  BID  Runell Gess, MD   5 mg at 09/16/12 2142    .  magnesium hydroxide (MILK OF MAGNESIA) suspension 30 mL  30 mL  Oral  Daily PRN  Runell Gess, MD   30 mL at 09/13/12 2151    .  nebivolol (BYSTOLIC) tablet 40 mg  40 mg  Oral  Daily  Wilburt Finlay, PA   40 mg at 09/16/12 1029    .  niacin CR capsule 1,000 mg  1,000 mg  Oral  QHS  Runell Gess, MD   1,000 mg at 09/16/12 2142    .  nitroGLYCERIN (NITROSTAT) SL tablet 0.4 mg  0.4 mg  Sublingual  Q5 Min x 3 PRN  Abelino Derrick, PA   0.4 mg at 09/13/12 2112    .  nitroGLYCERIN 0.2 mg/mL in dextrose 5 % infusion  5 mcg/min  Intravenous  Titrated  Abelino Derrick, PA   20 mcg/min at 09/13/12 0600    .  ondansetron (ZOFRAN) injection 4 mg  4 mg  Intravenous  Q6H PRN  Mihai Croitoru, MD      .  oxyCODONE-acetaminophen (PERCOCET/ROXICET) 5-325 MG per tablet 1-2 tablet  1-2 tablet  Oral  Q4H PRN  Mihai Croitoru, MD      .  polyethylene glycol (MIRALAX / GLYCOLAX) packet 17 g  17 g  Oral  Daily  Nada Boozer, NP   17 g at 09/16/12 1030    .   potassium chloride SA (K-DUR,KLOR-CON) CR tablet 20 mEq  20 mEq  Oral  Daily  Runell Gess, MD   20 mEq at 09/16/12 1029    .  ranolazine (RANEXA) 12 hr tablet 500 mg  500 mg  Oral  BID  Eda Paschal [place], PA   500 mg at 09/16/12 2142    .  zolpidem (AMBIEN) tablet 5 mg  5 mg  Oral  QHS PRN  Abelino Derrick, PA      .  DISCONTD: famotidine (PEPCID) tablet 20 mg  20 mg  Oral  BID  Abelino Derrick, PA   20 mg at 09/16/12 1029    .  DISCONTD: irbesartan (AVAPRO) tablet 150 mg  150 mg  Oral  Daily  Eda Paschal [place], [place]   150 mg at 09/15/12 5409     Physical Exam:  General appearance: alert, no distress and on oxygen per nasal cannula  Neck: no adenopathy, no carotid bruit, no JVD, supple, symmetrical, trachea midline and thyroid not enlarged, symmetric, no tenderness/mass/nodules  Lungs: diminished breath sounds bibasilar  Heart: regular rate and rhythm, S1, S2 normal, no murmur, click, rub or gallop  Abdomen: soft, non-tender; bowel sounds normal; no masses, no organomegaly  Extremities: extremities normal, atraumatic, no cyanosis or edema  Pulses: 2+ and symmetric  Skin: Skin color, texture, turgor normal. No rashes or lesions  Neurologic: Grossly normal  Lab Results:    Results for orders placed during the hospital encounter of 09/11/12 (from the past 48 hour(s))    GLUCOSE, CAPILLARY  Status: Abnormal     Collection Time     09/15/12 12:04 PM    Component  Value  Range  Comment     Glucose-Capillary  136 (*)  70 - 99 mg/dL      Comment 1  Notify RN      GLUCOSE, CAPILLARY Status: Abnormal     Collection Time     09/15/12 4:34 PM    Component  Value  Range  Comment     Glucose-Capillary  143 (*)  70 - 99 mg/dL      Comment 1  Notify RN      URINALYSIS, ROUTINE W REFLEX MICROSCOPIC Status: Abnormal     Collection Time     09/15/12 7:05 PM    Component  Value  Range  Comment     Color, Urine  YELLOW  YELLOW      APPearance  CLEAR  CLEAR      Specific Gravity, Urine  1.020  1.005 - 1.030        pH  6.5  5.0 - 8.0      Glucose, UA  NEGATIVE  NEGATIVE mg/dL      Hgb urine dipstick  NEGATIVE  NEGATIVE      Bilirubin Urine  NEGATIVE  NEGATIVE      Ketones, ur  NEGATIVE  NEGATIVE mg/dL      Protein, ur  366 (*)  NEGATIVE mg/dL      Urobilinogen, UA  0.2  0.0 - 1.0 mg/dL      Nitrite  NEGATIVE  NEGATIVE      Leukocytes, UA  NEGATIVE  NEGATIVE     URINE MICROSCOPIC-ADD ON Status: Abnormal     Collection Time     09/15/12 7:05 PM    Component  Value  Range  Comment     Squamous Epithelial / LPF  MANY (*)  RARE      WBC, UA  0-2  <3 WBC/hpf      Bacteria, UA  RARE  RARE      Urine-Other  RARE YEAST      GLUCOSE, CAPILLARY Status: Abnormal     Collection Time     09/15/12 9:16 PM    Component  Value  Range  Comment     Glucose-Capillary  121 (*)  70 - 99 mg/dL      Comment 1  Notify RN      CBC Status: Abnormal     Collection Time     09/16/12 5:54 AM    Component  Value  Range  Comment     WBC  8.6  4.0 - 10.5 K/uL      RBC  4.78  3.87 - 5.11 MIL/uL      Hemoglobin  11.6 (*)  12.0 - 15.0 g/dL      HCT  44.0  34.7 - 46.0 %      MCV  77.0 (*)  78.0 - 100.0 fL      MCH  24.3 (*)  26.0 - 34.0 pg      MCHC  31.5  30.0 - 36.0 g/dL      RDW  42.5 (*)  95.6 - 15.5 %      Platelets  209  150 - 400 K/uL     BASIC METABOLIC PANEL Status: Abnormal     Collection Time     09/16/12 5:54 AM    Component  Value  Range  Comment  Sodium  141  135 - 145 mEq/L      Potassium  4.5  3.5 - 5.1 mEq/L      Chloride  105  96 - 112 mEq/L      CO2  28  19 - 32 mEq/L      Glucose, Bld  94  70 - 99 mg/dL      BUN  28 (*)  6 - 23 mg/dL      Creatinine, Ser  7.82 (*)  0.50 - 1.10 mg/dL      Calcium  9.2  8.4 - 10.5 mg/dL      GFR calc non Af Amer  22 (*)  >90 mL/min      GFR calc Af Amer  26 (*)  >90 mL/min     GLUCOSE, CAPILLARY Status: Normal     Collection Time     09/16/12 7:39 AM    Component  Value  Range  Comment     Glucose-Capillary  97  70 - 99 mg/dL      Comment 1  Notify RN       GLUCOSE, CAPILLARY Status: Abnormal     Collection Time     09/16/12 11:29 AM    Component  Value  Range  Comment     Glucose-Capillary  176 (*)  70 - 99 mg/dL      Comment 1  Notify RN      GLUCOSE, CAPILLARY Status: Abnormal     Collection Time     09/16/12 4:59 PM    Component  Value  Range  Comment     Glucose-Capillary  190 (*)  70 - 99 mg/dL      Comment 1  Notify RN      GLUCOSE, CAPILLARY Status: Abnormal     Collection Time     09/16/12 9:03 PM    Component  Value  Range  Comment     Glucose-Capillary  119 (*)  70 - 99 mg/dL      Comment 1  Notify RN      GLUCOSE, CAPILLARY Status: Normal     Collection Time     09/17/12 1:56 AM    Component  Value  Range  Comment     Glucose-Capillary  74  70 - 99 mg/dL     GLUCOSE, CAPILLARY Status: Abnormal     Collection Time     09/17/12 2:22 AM    Component  Value  Range  Comment     Glucose-Capillary  114 (*)  70 - 99 mg/dL      Comment 1  Notify RN      CBC Status: Abnormal     Collection Time     09/17/12 5:56 AM    Component  Value  Range  Comment     WBC  7.6  4.0 - 10.5 K/uL      RBC  4.68  3.87 - 5.11 MIL/uL      Hemoglobin  11.2 (*)  12.0 - 15.0 g/dL      HCT  95.6  21.3 - 46.0 %      MCV  77.8 (*)  78.0 - 100.0 fL      MCH  23.9 (*)  26.0 - 34.0 pg      MCHC  30.8  30.0 - 36.0 g/dL      RDW  08.6 (*)  57.8 - 15.5 %      Platelets  210  150 -  400 K/uL     GLUCOSE, CAPILLARY Status: Abnormal     Collection Time     09/17/12 7:39 AM    Component  Value  Range  Comment     Glucose-Capillary  106 (*)  70 - 99 mg/dL      Imaging:  Dg Hip Complete Left  09/15/2012 *RADIOLOGY REPORT* Clinical Data: Left hip pain LEFT HIP - COMPLETE 2+ VIEW Comparison: 07/22/2007 Findings: Negative for fracture. Mild degenerative change in the hip joint bilaterally is similar to the prior study. Negative for AVN. No focal bony destruction. IMPRESSION: Mild degenerative change in both hip joints. No acute abnormality. Original  Report Authenticated By: Camelia Phenes, M.D.   Assessment:  1. Principal Problem: 2. *Acute on chronic diastolic heart failure - with SSx of Unstable Angina 3. Active Problems: 4. Diabetes mellitus 5. HYPERLIPIDEMIA 6. OBESITY 7. HYPERTENSION 8. CAD, prior LAD/RCA/CFX stents, cath 09/12/12 with patent stents 9. HYPERTENSION, PULMONARY - partially due to LV diastolic dysfunction 10. DIASTOLIC DYSFUNCTION, normal LVF 11. BRONCHITIS, CHRONIC 12. COPD 13. SLEEP APNEA, on C-pap 14. Unstable angina 15.  Plan:  1. Awaiting today's creatinine value. Renal function appears to be improving. May be able to restart low dose lasix today if creatinine improves. Hold on restarting bp meds until tomorrow - agree that Bidil would be a good choice if creatinine remains elevated and she remains hypertensive. Time Spent Directly with Patient:  15 minutes  Length of Stay:  LOS: 6 days  Krystal Nose, MD, [facility]  Attending Cardiologist  [facility]  HILTY,Kenneth C  09/17/2012, 8:52 AM

## 2012-09-17 NOTE — Progress Notes (Signed)
CARDIAC REHAB PHASE I   PRE:  Rate/Rhythm: 73 SR    BP: sitting 173/61    SaO2: 100 2L  MODE:  Ambulation: 450 ft   POST:  Rate/Rhythm: 82 SR    BP: sitting 194/74     SaO2: 100 2L  Stronger today. Less SOB. X1 rest break toward end of longer walk. Used RW, assist x1 with 2L O2. Tired after walk. BP elevated in right forearm. Back to bed for sleep. [id number]  Harriet Masson CES, ACSM

## 2012-09-18 ENCOUNTER — Encounter (HOSPITAL_COMMUNITY): Payer: Self-pay | Admitting: Cardiology

## 2012-09-18 DIAGNOSIS — E875 Hyperkalemia: Secondary | ICD-10-CM | POA: Diagnosis present

## 2012-09-18 DIAGNOSIS — N990 Postprocedural (acute) (chronic) kidney failure: Secondary | ICD-10-CM | POA: Diagnosis not present

## 2012-09-18 LAB — BASIC METABOLIC PANEL
BUN: 23 mg/dL (ref 6–23)
CO2: 25 mEq/L (ref 19–32)
Calcium: 9.2 mg/dL (ref 8.4–10.5)
Chloride: 105 mEq/L (ref 96–112)
Creatinine, Ser: 1.67 mg/dL — ABNORMAL HIGH (ref 0.50–1.10)

## 2012-09-18 LAB — CBC
HCT: 37.2 % (ref 36.0–46.0)
MCH: 23.8 pg — ABNORMAL LOW (ref 26.0–34.0)
MCV: 77 fL — ABNORMAL LOW (ref 78.0–100.0)
RBC: 4.83 MIL/uL (ref 3.87–5.11)
WBC: 7.2 10*3/uL (ref 4.0–10.5)

## 2012-09-18 LAB — GLUCOSE, CAPILLARY: Glucose-Capillary: 225 mg/dL — ABNORMAL HIGH (ref 70–99)

## 2012-09-18 MED ORDER — NEBIVOLOL HCL 10 MG PO TABS
20.0000 mg | ORAL_TABLET | Freq: Every day | ORAL | Status: DC
  Administered 2012-09-18 – 2012-09-19 (×2): 20 mg via ORAL
  Filled 2012-09-18 (×2): qty 2

## 2012-09-18 MED ORDER — SODIUM POLYSTYRENE SULFONATE 15 GM/60ML PO SUSP
15.0000 g | Freq: Once | ORAL | Status: AC
  Administered 2012-09-18: 15 g via ORAL
  Filled 2012-09-18: qty 60

## 2012-09-18 MED ORDER — INSULIN ASPART PROT & ASPART (70-30 MIX) 100 UNIT/ML ~~LOC~~ SUSP
10.0000 [IU] | Freq: Once | SUBCUTANEOUS | Status: AC
  Administered 2012-09-18: 10 [IU] via SUBCUTANEOUS
  Filled 2012-09-18: qty 3

## 2012-09-18 MED ORDER — AMLODIPINE BESYLATE 5 MG PO TABS
5.0000 mg | ORAL_TABLET | Freq: Every day | ORAL | Status: DC
  Administered 2012-09-18 – 2012-09-19 (×2): 5 mg via ORAL
  Filled 2012-09-18 (×2): qty 1

## 2012-09-18 NOTE — Progress Notes (Signed)
Pt states not ready to wear CPAP at this time and will self administer when ready. Pt will call if assistance needed.

## 2012-09-18 NOTE — Progress Notes (Signed)
Subjective: No complaints  Walked yesterday only 1 rest break.  BP elevated after walk 194/74--off ARB and verapamil   Objective: Vital signs in last 24 hours: Temp:  [98 F (36.7 C)-98.5 F (36.9 C)] 98 F (36.7 C) (10/31 0518) Pulse Rate:  [66-70] 67  (10/31 0518) Resp:  [18-20] 18  (10/31 0518) BP: (128-170)/(70-74) 149/74 mmHg (10/31 0518) SpO2:  [98 %-100 %] 100 % (10/31 0518) Weight change:  Last BM Date: 09/17/12 Intake/Output from previous day: +600 10/30 0701 - 10/31 0700 In: 600 [P.O.:600] Out: -  Intake/Output this shift:    PE: General: alert and oriented Heart:S1S2 RRR Lungs:decreased in the bases Abd:+ BS, soft, nontender Ext:no edema    Lab Results:  [facility] 09/18/12 0610 09/17/12 0556  WBC 7.2 7.6  HGB 11.5* 11.2*  HCT 37.2 36.4  PLT 235 210   BMET  Basename 09/18/12 0610 09/17/12 1054  NA 140 138  K 5.5* 5.2*  CL 105 105  CO2 25 27  GLUCOSE 83 253*  BUN 23 24*  CREATININE 1.67* 1.85*  CALCIUM 9.2 9.1   No results found for this basename: TROPONINI:2,CK,MB:2 in the last 72 hours  Lab Results  Component Value Date   CHOL 147 03/01/2009   HDL 33 03/01/2009   LDLCALC 92 03/01/2009   TRIG 166 03/01/2009   CHOLHDL 4.1 03/06/2008   Lab Results  Component Value Date   HGBA1C 10.2* 09/11/2012     Lab Results  Component Value Date   TSH 1.571 09/11/2012      EKG: Orders placed during the hospital encounter of 09/11/12  . EKG 12-LEAD  . EKG 12-LEAD  . EKG 12-LEAD  . EKG 12-LEAD  . EKG 12-LEAD  . EKG 12-LEAD  . EKG 12-LEAD  . EKG  . EKG 12-LEAD  . EKG 12-LEAD    Studies/Results: No results found.  Medications: I have reviewed the patient's current medications.    Marland Kitchen aspirin EC  81 mg Oral Daily  . atorvastatin  80 mg Oral q1800  . clopidogrel  75 mg Oral Daily  . docusate sodium  100 mg Oral BID  . ezetimibe  10 mg Oral Daily  . famotidine  20 mg Oral Daily  . ferrous sulfate  325 mg Oral Daily  . Fluticasone-Salmeterol   1 puff Inhalation BID  . glipiZIDE  10 mg Oral BID WC  . insulin aspart  0-15 Units Subcutaneous TID WC  . insulin aspart  0-5 Units Subcutaneous QHS  . insulin aspart protamine-insulin aspart  10 Units Subcutaneous Once  . insulin aspart protamine-insulin aspart  70 Units Subcutaneous BID WC  . loratadine  5 mg Oral BID  . nebivolol  40 mg Oral Daily  . niacin  1,000 mg Oral QHS  . polyethylene glycol  17 g Oral Daily  . ranolazine  500 mg Oral BID  . DISCONTD: potassium chloride  20 mEq Oral Daily   Assessment/Plan: Principal Problem:  *Acute on chronic diastolic heart failure - with SSx of Unstable Angina Active Problems:  Diabetes mellitus  HYPERLIPIDEMIA  OBESITY  HYPERTENSION  CAD, prior LAD/RCA/CFX stents, cath 09/12/12 with patent stents  HYPERTENSION, PULMONARY - partially due to LV diastolic dysfunction  DIASTOLIC DYSFUNCTION, normal LVF  BRONCHITIS, CHRONIC  COPD  SLEEP APNEA, on C-pap  Unstable angina  Acute renal failure due to procedure  CKD (chronic kidney disease) stage 3, GFR 30-59 ml/min  Hyperkalemia  PLAN: no further arrhythmias except last pm slept without cpap and  had pause with junct escape beat.  Cr improving K+ level still elevated, will give kayexalate.  Constipation resolved.  Off Lasix, torsemide,  Verapamil, benicar.   BP elevated with exertion.  Outpt she was on furosemide and torsemide.  She has seen Dr. Caryn [facility] at [facility].  ? D/c today vs. Tomorrow after medication adjustment. Off all diuretics  LOS: 7 days   INGOLD,LAURA R 09/18/2012, 9:19 AM   Patient seen and examined. Agree with assessment and plan.  Not yet ready for DC today.  Will decrease Bystolic to 20 mg daily, and try low dose amlodipine at 5 mg for improved BP control; she may have had edema issues in the past. Cr slowly improving. Plan home tomorrow with OV f/u. May ultimately need low dose diuretic as an outpatient.   Lennette Bihari, MD, [facility] 09/18/2012 10:01 AM

## 2012-09-18 NOTE — Progress Notes (Signed)
CARDIAC REHAB PHASE I   PRE:  Rate/Rhythm: 65SR  BP:  Supine:   Sitting: 127/78  Standing:    SaO2: 100%2L  MODE:  Ambulation: 550 ft   POST:  Rate/Rhythem: 76SR  BP:  Supine:   Sitting: 151/61  Standing:    SaO2: 97%2L 1153-1212 Pt walked 550 ft on 2L with rolling walker and minimal asst. Stopped a couple of times to rest. Wheezing. To recliner after walk. Discussed daily weights during walk. Pt knew to call MD if 3lb weight gain overnight or 5 lbs in week.  Krystal [name]

## 2012-09-19 LAB — CBC
HCT: 36 % (ref 36.0–46.0)
Hemoglobin: 11 g/dL — ABNORMAL LOW (ref 12.0–15.0)
RBC: 4.63 MIL/uL (ref 3.87–5.11)
RDW: 16 % — ABNORMAL HIGH (ref 11.5–15.5)
WBC: 7.2 10*3/uL (ref 4.0–10.5)

## 2012-09-19 LAB — BASIC METABOLIC PANEL
BUN: 21 mg/dL (ref 6–23)
CO2: 29 mEq/L (ref 19–32)
Chloride: 105 mEq/L (ref 96–112)
GFR calc Af Amer: 38 mL/min — ABNORMAL LOW (ref >90)
Glucose, Bld: 85 mg/dL (ref 70–99)
Potassium: 4.2 mEq/L (ref 3.5–5.1)

## 2012-09-19 LAB — GLUCOSE, CAPILLARY: Glucose-Capillary: 93 mg/dL (ref 70–99)

## 2012-09-19 MED ORDER — RANOLAZINE ER 500 MG PO TB12: 500.0000 mg | ORAL_TABLET | Freq: Two times a day (BID) | ORAL | Status: DC

## 2012-09-19 MED ORDER — AMLODIPINE BESYLATE 5 MG PO TABS: 5.0000 mg | ORAL_TABLET | Freq: Every day | ORAL | Status: DC

## 2012-09-19 MED ORDER — FUROSEMIDE 40 MG PO TABS: 40.0000 mg | ORAL_TABLET | ORAL | Status: DC | PRN

## 2012-09-19 NOTE — Progress Notes (Signed)
[facility]  Subjective: No Complaints.  Objective: Vital signs in last 24 hours: Temp:  [97.4 F (36.3 C)-98.4 F (36.9 C)] 97.8 F (36.6 C) (11/01 0603) Pulse Rate:  [65-66] 66  (11/01 0603) Resp:  [18-20] 18  (11/01 0603) BP: (127-162)/(49-81) 156/49 mmHg (11/01 0603) SpO2:  [98 %-100 %] 100 % (11/01 0603) Last BM Date: 09/18/12  Intake/Output from previous day: 10/31 0701 - 11/01 0700 In: 1200 [P.O.:1200] Out: -  Intake/Output this shift:    Medications Current Facility-Administered Medications  Medication Dose Route Frequency Provider Last Rate Last Dose  . 0.9 %  sodium chloride infusion   Intravenous Continuous Runell Gess, MD      . acetaminophen (TYLENOL) tablet 650 mg  650 mg Oral Q4H PRN Mihai Croitoru, MD      . ALPRAZolam Prudy Feeler) tablet 0.25 mg  0.25 mg Oral BID PRN Abelino Derrick, PA      . amLODipine (NORVASC) tablet 5 mg  5 mg Oral Daily Nada Boozer, NP   5 mg at 09/18/12 1135  . aspirin EC tablet 81 mg  81 mg Oral Daily Eda Paschal [place], [place]   81 mg at 09/18/12 1137  . atorvastatin (LIPITOR) tablet 80 mg  80 mg Oral [address] [place], [place]   80 mg at 09/18/12 1735  . clopidogrel (PLAVIX) tablet 75 mg  75 mg Oral Daily Eda Paschal [place], [place]   75 mg at 09/18/12 1137  . docusate sodium (COLACE) capsule 100 mg  100 mg Oral BID Nada Boozer, NP   100 mg at 09/18/12 2218  . ezetimibe (ZETIA) tablet 10 mg  10 mg Oral Daily Eda Paschal [place], [place]   10 mg at 09/18/12 1138  . famotidine (PEPCID) tablet 20 mg  20 mg Oral Daily Eda Paschal [place], [place]   20 mg at 09/18/12 1136  . ferrous sulfate tablet 325 mg  325 mg Oral Daily Eda Paschal [place], [place]   325 mg at 09/18/12 1137  . Fluticasone-Salmeterol (ADVAIR) 250-50 MCG/DOSE inhaler 1 puff  1 puff Inhalation BID Eda Paschal [place], [place]   1 puff at 09/18/12 2048  . glipiZIDE (GLUCOTROL XL) 24 hr tablet 10 mg  10 mg Oral BID WC Abelino Derrick, PA   10 mg at 09/19/12 0900  . hydrALAZINE (APRESOLINE) injection 10 mg  10  mg Intravenous Q6H PRN Wilburt Finlay, PA   10 mg at 09/13/12 0359  . HYDROcodone-acetaminophen (NORCO/VICODIN) 5-325 MG per tablet 1-2 tablet  1-2 tablet Oral Q6H PRN Abelino Derrick, PA   2 tablet at 09/18/12 2009  . insulin aspart (novoLOG) injection 0-15 Units  0-15 Units Subcutaneous TID WC Eda Paschal [place], PA   8 Units at 09/18/12 1735  . insulin aspart (novoLOG) injection 0-5 Units  0-5 Units Subcutaneous QHS Eda Paschal [place], [place]   3 Units at 09/18/12 2219  . insulin aspart protamine-insulin aspart (NOVOLOG 70/30) injection 70 Units  70 Units Subcutaneous BID WC Marykay Lex, MD   70 Units at 09/19/12 0859  . levalbuterol (XOPENEX) nebulizer solution 0.63 mg  0.63 mg Nebulization Q8H PRN Wilburt Finlay, PA   0.63 mg at 09/14/12 1751  . loratadine (CLARITIN) tablet 5 mg  5 mg Oral BID Runell Gess, MD   5 mg at 09/18/12 2218  . magnesium hydroxide (MILK OF MAGNESIA) suspension 30 mL  30 mL Oral Daily PRN Runell Gess, MD   30 mL at 09/13/12  2151  . nebivolol (BYSTOLIC) tablet 20 mg  20 mg Oral Daily Nada Boozer, NP   20 mg at 09/18/12 1138  . niacin CR capsule 1,000 mg  1,000 mg Oral QHS Runell Gess, MD   1,000 mg at 09/18/12 2218  . nitroGLYCERIN (NITROSTAT) SL tablet 0.4 mg  0.4 mg Sublingual Q5 Min x 3 PRN Abelino Derrick, PA   0.4 mg at 09/13/12 2112  . ondansetron (ZOFRAN) injection 4 mg  4 mg Intravenous Q6H PRN Mihai Croitoru, MD      . oxyCODONE-acetaminophen (PERCOCET/ROXICET) 5-325 MG per tablet 1-2 tablet  1-2 tablet Oral Q4H PRN Thurmon Fair, MD   1 tablet at 09/18/12 0827  . polyethylene glycol (MIRALAX / GLYCOLAX) packet 17 g  17 g Oral Daily Nada Boozer, NP   17 g at 09/18/12 1136  . ranolazine (RANEXA) 12 hr tablet 500 mg  500 mg Oral BID Eda Paschal [place], PA   500 mg at 09/18/12 2218  . sodium polystyrene (KAYEXALATE) 15 GM/60ML suspension 15 g  15 g Oral Once Nada Boozer, NP   15 g at 09/18/12 1136  . zolpidem (AMBIEN) tablet 5 mg  5 mg Oral QHS PRN Abelino Derrick, PA        . DISCONTD: nebivolol (BYSTOLIC) tablet 40 mg  40 mg Oral Daily Wilburt Finlay, PA   40 mg at 09/17/12 1011    PE: General appearance: alert, cooperative and no distress Lungs: clear to auscultation bilaterally Heart: regular rate and rhythm, S1, S2 normal, no murmur, click, rub or gallop Extremities: No LEE Pulses: 2+ and symmetric Neurologic: Grossly normal  Lab Results:   Basename 09/19/12 0545 09/18/12 0610 09/17/12 0556  WBC 7.2 7.2 7.6  HGB 11.0* 11.5* 11.2*  HCT 36.0 37.2 36.4  PLT 210 235 210   BMET  Basename 09/19/12 0545 09/18/12 0610 09/17/12 1054  NA 141 140 138  K 4.2 5.5* 5.2*  CL 105 105 105  CO2 29 25 27   GLUCOSE 85 83 253*  BUN 21 23 24*  CREATININE 1.52* 1.67* 1.85*  CALCIUM 8.7 9.2 9.1   PT/INR No results found for this basename: LABPROT:3,INR:3 in the last 72 hours Cholesterol No results found for this basename: CHOL in the last 72 hours Cardiac Enzymes No components found with this basename: TROPONIN:3, CKMB:3  Studies/Results: @RISRSLT2 @   Assessment/Plan   Principal Problem:  *Acute on chronic diastolic heart failure - with SSx of Unstable Angina Active Problems:  Diabetes mellitus  HYPERLIPIDEMIA  OBESITY  HYPERTENSION  CAD, prior LAD/RCA/CFX stents, cath 09/12/12 with patent stents  HYPERTENSION, PULMONARY - partially due to LV diastolic dysfunction  DIASTOLIC DYSFUNCTION, normal LVF  BRONCHITIS, CHRONIC  COPD  SLEEP APNEA, on C-pap  Unstable angina  Acute renal failure due to procedure  CKD (chronic kidney disease) stage 3, GFR 30-59 ml/min  Hyperkalemia  Plan:  No further arrhythmia.   SCr continues to improves.  Now 1.52.  Potassium better after Kayexalate.   BP Stable.  Can adjust meds as OP.  Amlodipine adding yesterday and Bystolic decreased.  FU OP BMET and appt in 1-2 weeks. Discussed wt monitoring and sodium intake.  Will given PRN lasix.   LOS: 8 days    Randilyn Foisy 09/19/2012 9:28 AM

## 2012-09-19 NOTE — Progress Notes (Signed)
Patient placed on cpap auto with 3lpm 02 bleed in. Patient is tolerating cpap well at this time. Rt will continue to monitor.

## 2012-09-19 NOTE — Discharge Summary (Signed)
Patient ID: Krystal [name],  MRN: [id number], DOB/AGE: Jan 16, 1936 76 y.o.  Admit date: 09/11/2012 Discharge date: 09/19/2012  Primary Care Provider:  Dr Mirna Mires Primary Cardiologist: Dr Tresa Endo [place])  Discharge Diagnoses Principal Problem:  *Unstable angina Active Problems:  Acute on chronic diastolic heart failure - with SSx of Unstable Angina  HYPERTENSION  CAD, prior LAD/RCA/CFX stents, cath 09/12/12 with patent stents  HYPERTENSION, PULMONARY - partially due to LV diastolic dysfunction  COPD  Acute renal failure due to procedure  CKD (chronic kidney disease) stage 3, GFR 30-59 ml/min  Diabetes mellitus  HYPERLIPIDEMIA  OBESITY  DIASTOLIC DYSFUNCTION, normal LVF  BRONCHITIS, CHRONIC  SLEEP APNEA, on C-pap  Hyperkalemia, treated     Procedures: Rt and Lt heart cath 09/12/12   Hospital Course:  76 y/o female known to Dr Tresa Endo with a history of CAD. She has had a remote RCA stent in 1995. In Feb 2007 she had RCA ISR and a new RCA site Rx'd with Cypher stents. In Jan 2008 she had an LAD Cypher placed. Cath in April 2009, December 2010, and May 2012 have shown patency of these sites. The last cath was done after an abnormal Myoview. She presented to the [place] office 09/11/12 with a history of 3 days of SSCP and Lt arm pain. She also says she has been SOB. Her symptoms improved with NTG SL. She was admitted to telemetry, Troponin was negative. It was felt she would require a diagnostic cath to determine if she had progression in her CAD as her Myoview in the past was false positive. Rt and Lt heart cath was done 09/12/12 and revealed patent stents with evidence of pulmonary HTN and diastolic dysfunction. Post cath her SCr peaked at 2.15. She also had hyperkalemia and this was treated. Dr Rennis Golden feels she can be discharged 09/19/12 on prn Lasix based on her weight. We will ask for [facility] before discharge.   Discharge Vitals:  Blood pressure 180/75, pulse 66, temperature 97.8  F (36.6 C), temperature source Oral, resp. rate 18, height 5\' 7"  (1.702 m), weight 103 kg (227 lb 1.2 oz), SpO2 100.00%.    Labs: Results for orders placed during the hospital encounter of 09/11/12 (from the past 48 hour(s))  GLUCOSE, CAPILLARY     Status: Abnormal   Collection Time   09/17/12  4:45 PM      Component Value Range Comment   Glucose-Capillary 222 (*) 70 - 99 mg/dL    Comment 1 Notify RN     GLUCOSE, CAPILLARY     Status: Abnormal   Collection Time   09/17/12  9:04 PM      Component Value Range Comment   Glucose-Capillary 179 (*) 70 - 99 mg/dL    Comment 1 Notify RN     CBC     Status: Abnormal   Collection Time   09/18/12  6:10 AM      Component Value Range Comment   WBC 7.2  4.0 - 10.5 K/uL    RBC 4.83  3.87 - 5.11 MIL/uL    Hemoglobin 11.5 (*) 12.0 - 15.0 g/dL    HCT 91.4  78.2 - 95.6 %    MCV 77.0 (*) 78.0 - 100.0 fL    MCH 23.8 (*) 26.0 - 34.0 pg    MCHC 30.9  30.0 - 36.0 g/dL    RDW 21.3 (*) 08.6 - 15.5 %    Platelets 235  150 - 400 K/uL   BASIC METABOLIC PANEL  Status: Abnormal   Collection Time   09/18/12  6:10 AM      Component Value Range Comment   Sodium 140  135 - 145 mEq/L    Potassium 5.5 (*) 3.5 - 5.1 mEq/L MARKED HEMOLYSIS   Chloride 105  96 - 112 mEq/L    CO2 25  19 - 32 mEq/L    Glucose, Bld 83  70 - 99 mg/dL    BUN 23  6 - 23 mg/dL    Creatinine, Ser 1.61 (*) 0.50 - 1.10 mg/dL    Calcium 9.2  8.4 - 09.6 mg/dL    GFR calc non Af Amer 29 (*) >90 mL/min    GFR calc Af Amer 34 (*) >90 mL/min   GLUCOSE, CAPILLARY     Status: Normal   Collection Time   09/18/12  7:36 AM      Component Value Range Comment   Glucose-Capillary 72  70 - 99 mg/dL   GLUCOSE, CAPILLARY     Status: Abnormal   Collection Time   09/18/12 11:36 AM      Component Value Range Comment   Glucose-Capillary 225 (*) 70 - 99 mg/dL   GLUCOSE, CAPILLARY     Status: Abnormal   Collection Time   09/18/12  4:02 PM      Component Value Range Comment   Glucose-Capillary  303 (*) 70 - 99 mg/dL   GLUCOSE, CAPILLARY     Status: Abnormal   Collection Time   09/18/12  9:41 PM      Component Value Range Comment   Glucose-Capillary 271 (*) 70 - 99 mg/dL   CBC     Status: Abnormal   Collection Time   09/19/12  5:45 AM      Component Value Range Comment   WBC 7.2  4.0 - 10.5 K/uL    RBC 4.63  3.87 - 5.11 MIL/uL    Hemoglobin 11.0 (*) 12.0 - 15.0 g/dL    HCT 04.5  40.9 - 81.1 %    MCV 77.8 (*) 78.0 - 100.0 fL    MCH 23.8 (*) 26.0 - 34.0 pg    MCHC 30.6  30.0 - 36.0 g/dL    RDW 91.4 (*) 78.2 - 15.5 %    Platelets 210  150 - 400 K/uL   BASIC METABOLIC PANEL     Status: Abnormal   Collection Time   09/19/12  5:45 AM      Component Value Range Comment   Sodium 141  135 - 145 mEq/L    Potassium 4.2  3.5 - 5.1 mEq/L    Chloride 105  96 - 112 mEq/L    CO2 29  19 - 32 mEq/L    Glucose, Bld 85  70 - 99 mg/dL    BUN 21  6 - 23 mg/dL    Creatinine, Ser 9.56 (*) 0.50 - 1.10 mg/dL    Calcium 8.7  8.4 - 21.3 mg/dL    GFR calc non Af Amer 33 (*) >90 mL/min    GFR calc Af Amer 38 (*) >90 mL/min   GLUCOSE, CAPILLARY     Status: Normal   Collection Time   09/19/12  7:34 AM      Component Value Range Comment   Glucose-Capillary 93  70 - 99 mg/dL   GLUCOSE, CAPILLARY     Status: Abnormal   Collection Time   09/19/12 11:34 AM      Component Value Range Comment   Glucose-Capillary 133 (*)  70 - 99 mg/dL     Disposition: Follow up with Dr Rennis Golden as Dr Tresa Endo has no appointments for 3 months in [place]. BMP next week.     Follow-up Information    Follow up with Chrystie Nose, MD. On 10/09/2012. (9:30am at the [facility] office)    Contact information:   [address] [place] [address] [phone]          Discharge Medications:    Medication List     As of 09/19/2012 12:14 PM    STOP taking these medications         potassium chloride 10 MEQ CR tablet   Commonly known as: KLOR-CON      torsemide 20 MG tablet   Commonly known as:  DEMADEX      TAKE these medications         ADVAIR DISKUS 250-50 MCG/DOSE Aepb   Generic drug: Fluticasone-Salmeterol   Inhale 1 puff into the lungs 2 (two) times daily.      amLODipine 5 MG tablet   Commonly known as: NORVASC   Take 1 tablet (5 mg total) by mouth daily.      aspirin 81 MG EC tablet   Take 81 mg by mouth daily.      budesonide-formoterol 160-4.5 MCG/ACT inhaler   Commonly known as: SYMBICORT   Inhale 2 puffs into the lungs 2 (two) times daily.      BYSTOLIC 20 MG Tabs   Generic drug: Nebivolol HCl   Take 20 mg by mouth daily. Takes with 10mg  tablet for total dose of 30mg       clopidogrel 75 MG tablet   Commonly known as: PLAVIX   Take 75 mg by mouth daily.      ezetimibe 10 MG tablet   Commonly known as: ZETIA   Take 10 mg by mouth daily.      famotidine 20 MG tablet   Commonly known as: PEPCID   Take 20 mg by mouth 2 (two) times daily.      ferrous sulfate 325 (65 FE) MG tablet   Take 325 mg by mouth daily.      furosemide 40 MG tablet   Commonly known as: LASIX   Take 1 tablet (40 mg total) by mouth as needed (take one if your weight increases more than 2 lbs in 24hrs).      glipiZIDE 10 MG 24 hr tablet   Commonly known as: GLUCOTROL XL   Take 10 mg by mouth 2 (two) times daily.      HYDROcodone-acetaminophen 5-500 MG per tablet   Commonly known as: VICODIN   Take 1 tablet by mouth every 6 (six) hours as needed. For pain      insulin lispro protamine-insulin lispro (75-25) 100 UNIT/ML Susp   Commonly known as: HUMALOG 75/25   Inject 70 Units into the skin 2 (two) times daily with a meal.      isosorbide mononitrate 60 MG 24 hr tablet   Commonly known as: IMDUR   Take 30 mg by mouth daily.      loratadine 10 MG tablet   Commonly known as: CLARITIN   Take 10 mg by mouth daily.      NITROSTAT 0.4 MG SL tablet   Generic drug: nitroGLYCERIN   Take 1 tablet by mouth every 5 (five) minutes x 3 doses as needed. For chest pains       olmesartan 20 MG tablet   Commonly known as: [employer]  Take 40 mg by mouth daily.      ranolazine 500 MG 12 hr tablet   Commonly known as: RANEXA   Take 1 tablet (500 mg total) by mouth 2 (two) times daily.      rosuvastatin 40 MG tablet   Commonly known as: CRESTOR   Take 40 mg by mouth daily.      verapamil 120 MG CR tablet   Commonly known as: CALAN-SR   Take 120 mg by mouth at bedtime.           Duration of Discharge Encounter: Greater than 30 minutes including physician time.  Jolene Provost PA-C 09/19/2012 12:14 PM

## 2012-09-19 NOTE — Progress Notes (Signed)
Pt. Seen and examined. Agree with the NP/PA-C note as written.  Ok for discharge today. Outpatient follow-up in our office.  Chrystie Nose, MD, [facility] Attending Cardiologist [facility]

## 2012-09-20 DIAGNOSIS — E119 Type 2 diabetes mellitus without complications: Secondary | ICD-10-CM | POA: Diagnosis not present

## 2012-09-20 DIAGNOSIS — I5033 Acute on chronic diastolic (congestive) heart failure: Secondary | ICD-10-CM | POA: Diagnosis not present

## 2012-09-20 DIAGNOSIS — I2510 Atherosclerotic heart disease of native coronary artery without angina pectoris: Secondary | ICD-10-CM | POA: Diagnosis not present

## 2012-09-20 DIAGNOSIS — I509 Heart failure, unspecified: Secondary | ICD-10-CM | POA: Diagnosis not present

## 2012-09-20 DIAGNOSIS — J449 Chronic obstructive pulmonary disease, unspecified: Secondary | ICD-10-CM | POA: Diagnosis not present

## 2012-09-20 DIAGNOSIS — M159 Polyosteoarthritis, unspecified: Secondary | ICD-10-CM | POA: Diagnosis not present

## 2012-09-24 DIAGNOSIS — I5033 Acute on chronic diastolic (congestive) heart failure: Secondary | ICD-10-CM | POA: Diagnosis not present

## 2012-09-24 DIAGNOSIS — I509 Heart failure, unspecified: Secondary | ICD-10-CM | POA: Diagnosis not present

## 2012-09-24 DIAGNOSIS — M159 Polyosteoarthritis, unspecified: Secondary | ICD-10-CM | POA: Diagnosis not present

## 2012-09-24 DIAGNOSIS — I2510 Atherosclerotic heart disease of native coronary artery without angina pectoris: Secondary | ICD-10-CM | POA: Diagnosis not present

## 2012-09-24 DIAGNOSIS — E119 Type 2 diabetes mellitus without complications: Secondary | ICD-10-CM | POA: Diagnosis not present

## 2012-09-24 DIAGNOSIS — J449 Chronic obstructive pulmonary disease, unspecified: Secondary | ICD-10-CM | POA: Diagnosis not present

## 2012-09-25 DIAGNOSIS — Z79899 Other long term (current) drug therapy: Secondary | ICD-10-CM | POA: Diagnosis not present

## 2012-09-26 DIAGNOSIS — M159 Polyosteoarthritis, unspecified: Secondary | ICD-10-CM | POA: Diagnosis not present

## 2012-09-26 DIAGNOSIS — I509 Heart failure, unspecified: Secondary | ICD-10-CM | POA: Diagnosis not present

## 2012-09-26 DIAGNOSIS — J449 Chronic obstructive pulmonary disease, unspecified: Secondary | ICD-10-CM | POA: Diagnosis not present

## 2012-09-26 DIAGNOSIS — I2510 Atherosclerotic heart disease of native coronary artery without angina pectoris: Secondary | ICD-10-CM | POA: Diagnosis not present

## 2012-09-26 DIAGNOSIS — E119 Type 2 diabetes mellitus without complications: Secondary | ICD-10-CM | POA: Diagnosis not present

## 2012-09-26 DIAGNOSIS — I5033 Acute on chronic diastolic (congestive) heart failure: Secondary | ICD-10-CM | POA: Diagnosis not present

## 2012-09-27 DIAGNOSIS — E119 Type 2 diabetes mellitus without complications: Secondary | ICD-10-CM | POA: Diagnosis not present

## 2012-09-27 DIAGNOSIS — I2510 Atherosclerotic heart disease of native coronary artery without angina pectoris: Secondary | ICD-10-CM | POA: Diagnosis not present

## 2012-09-27 DIAGNOSIS — I509 Heart failure, unspecified: Secondary | ICD-10-CM | POA: Diagnosis not present

## 2012-09-27 DIAGNOSIS — M159 Polyosteoarthritis, unspecified: Secondary | ICD-10-CM | POA: Diagnosis not present

## 2012-09-27 DIAGNOSIS — J449 Chronic obstructive pulmonary disease, unspecified: Secondary | ICD-10-CM | POA: Diagnosis not present

## 2012-09-27 DIAGNOSIS — I5033 Acute on chronic diastolic (congestive) heart failure: Secondary | ICD-10-CM | POA: Diagnosis not present

## 2012-09-29 ENCOUNTER — Encounter: Payer: Self-pay | Admitting: *Deleted

## 2012-09-30 DIAGNOSIS — I5033 Acute on chronic diastolic (congestive) heart failure: Secondary | ICD-10-CM | POA: Diagnosis not present

## 2012-09-30 DIAGNOSIS — I509 Heart failure, unspecified: Secondary | ICD-10-CM | POA: Diagnosis not present

## 2012-09-30 DIAGNOSIS — J449 Chronic obstructive pulmonary disease, unspecified: Secondary | ICD-10-CM | POA: Diagnosis not present

## 2012-09-30 DIAGNOSIS — E119 Type 2 diabetes mellitus without complications: Secondary | ICD-10-CM | POA: Diagnosis not present

## 2012-09-30 DIAGNOSIS — I2510 Atherosclerotic heart disease of native coronary artery without angina pectoris: Secondary | ICD-10-CM | POA: Diagnosis not present

## 2012-09-30 DIAGNOSIS — M159 Polyosteoarthritis, unspecified: Secondary | ICD-10-CM | POA: Diagnosis not present

## 2012-10-02 DIAGNOSIS — E119 Type 2 diabetes mellitus without complications: Secondary | ICD-10-CM | POA: Diagnosis not present

## 2012-10-02 DIAGNOSIS — I509 Heart failure, unspecified: Secondary | ICD-10-CM | POA: Diagnosis not present

## 2012-10-02 DIAGNOSIS — I2510 Atherosclerotic heart disease of native coronary artery without angina pectoris: Secondary | ICD-10-CM | POA: Diagnosis not present

## 2012-10-02 DIAGNOSIS — J449 Chronic obstructive pulmonary disease, unspecified: Secondary | ICD-10-CM | POA: Diagnosis not present

## 2012-10-02 DIAGNOSIS — M159 Polyosteoarthritis, unspecified: Secondary | ICD-10-CM | POA: Diagnosis not present

## 2012-10-02 DIAGNOSIS — I5033 Acute on chronic diastolic (congestive) heart failure: Secondary | ICD-10-CM | POA: Diagnosis not present

## 2012-10-03 DIAGNOSIS — I5033 Acute on chronic diastolic (congestive) heart failure: Secondary | ICD-10-CM | POA: Diagnosis not present

## 2012-10-03 DIAGNOSIS — I2510 Atherosclerotic heart disease of native coronary artery without angina pectoris: Secondary | ICD-10-CM | POA: Diagnosis not present

## 2012-10-03 DIAGNOSIS — I509 Heart failure, unspecified: Secondary | ICD-10-CM | POA: Diagnosis not present

## 2012-10-03 DIAGNOSIS — E119 Type 2 diabetes mellitus without complications: Secondary | ICD-10-CM | POA: Diagnosis not present

## 2012-10-03 DIAGNOSIS — M159 Polyosteoarthritis, unspecified: Secondary | ICD-10-CM | POA: Diagnosis not present

## 2012-10-03 DIAGNOSIS — J449 Chronic obstructive pulmonary disease, unspecified: Secondary | ICD-10-CM | POA: Diagnosis not present

## 2012-10-06 DIAGNOSIS — I509 Heart failure, unspecified: Secondary | ICD-10-CM | POA: Diagnosis not present

## 2012-10-06 DIAGNOSIS — J449 Chronic obstructive pulmonary disease, unspecified: Secondary | ICD-10-CM | POA: Diagnosis not present

## 2012-10-06 DIAGNOSIS — I2510 Atherosclerotic heart disease of native coronary artery without angina pectoris: Secondary | ICD-10-CM | POA: Diagnosis not present

## 2012-10-06 DIAGNOSIS — E119 Type 2 diabetes mellitus without complications: Secondary | ICD-10-CM | POA: Diagnosis not present

## 2012-10-06 DIAGNOSIS — M159 Polyosteoarthritis, unspecified: Secondary | ICD-10-CM | POA: Diagnosis not present

## 2012-10-06 DIAGNOSIS — I5033 Acute on chronic diastolic (congestive) heart failure: Secondary | ICD-10-CM | POA: Diagnosis not present

## 2012-10-08 DIAGNOSIS — E119 Type 2 diabetes mellitus without complications: Secondary | ICD-10-CM | POA: Diagnosis not present

## 2012-10-08 DIAGNOSIS — J449 Chronic obstructive pulmonary disease, unspecified: Secondary | ICD-10-CM | POA: Diagnosis not present

## 2012-10-08 DIAGNOSIS — M159 Polyosteoarthritis, unspecified: Secondary | ICD-10-CM | POA: Diagnosis not present

## 2012-10-08 DIAGNOSIS — I5033 Acute on chronic diastolic (congestive) heart failure: Secondary | ICD-10-CM | POA: Diagnosis not present

## 2012-10-08 DIAGNOSIS — I509 Heart failure, unspecified: Secondary | ICD-10-CM | POA: Diagnosis not present

## 2012-10-08 DIAGNOSIS — I2510 Atherosclerotic heart disease of native coronary artery without angina pectoris: Secondary | ICD-10-CM | POA: Diagnosis not present

## 2012-10-09 DIAGNOSIS — M159 Polyosteoarthritis, unspecified: Secondary | ICD-10-CM | POA: Diagnosis not present

## 2012-10-09 DIAGNOSIS — E785 Hyperlipidemia, unspecified: Secondary | ICD-10-CM | POA: Diagnosis not present

## 2012-10-09 DIAGNOSIS — J449 Chronic obstructive pulmonary disease, unspecified: Secondary | ICD-10-CM | POA: Diagnosis not present

## 2012-10-09 DIAGNOSIS — R0602 Shortness of breath: Secondary | ICD-10-CM | POA: Diagnosis not present

## 2012-10-09 DIAGNOSIS — E119 Type 2 diabetes mellitus without complications: Secondary | ICD-10-CM | POA: Diagnosis not present

## 2012-10-09 DIAGNOSIS — I2789 Other specified pulmonary heart diseases: Secondary | ICD-10-CM | POA: Diagnosis not present

## 2012-10-09 DIAGNOSIS — R069 Unspecified abnormalities of breathing: Secondary | ICD-10-CM | POA: Diagnosis not present

## 2012-10-09 DIAGNOSIS — E782 Mixed hyperlipidemia: Secondary | ICD-10-CM | POA: Diagnosis not present

## 2012-10-09 DIAGNOSIS — I5033 Acute on chronic diastolic (congestive) heart failure: Secondary | ICD-10-CM | POA: Diagnosis not present

## 2012-10-09 DIAGNOSIS — Z79899 Other long term (current) drug therapy: Secondary | ICD-10-CM | POA: Diagnosis not present

## 2012-10-09 DIAGNOSIS — I2510 Atherosclerotic heart disease of native coronary artery without angina pectoris: Secondary | ICD-10-CM | POA: Diagnosis not present

## 2012-10-09 DIAGNOSIS — I509 Heart failure, unspecified: Secondary | ICD-10-CM | POA: Diagnosis not present

## 2012-10-14 DIAGNOSIS — I509 Heart failure, unspecified: Secondary | ICD-10-CM | POA: Diagnosis not present

## 2012-10-14 DIAGNOSIS — J449 Chronic obstructive pulmonary disease, unspecified: Secondary | ICD-10-CM | POA: Diagnosis not present

## 2012-10-14 DIAGNOSIS — I5033 Acute on chronic diastolic (congestive) heart failure: Secondary | ICD-10-CM | POA: Diagnosis not present

## 2012-10-14 DIAGNOSIS — E119 Type 2 diabetes mellitus without complications: Secondary | ICD-10-CM | POA: Diagnosis not present

## 2012-10-14 DIAGNOSIS — I2510 Atherosclerotic heart disease of native coronary artery without angina pectoris: Secondary | ICD-10-CM | POA: Diagnosis not present

## 2012-10-14 DIAGNOSIS — M159 Polyosteoarthritis, unspecified: Secondary | ICD-10-CM | POA: Diagnosis not present

## 2012-10-17 DIAGNOSIS — I509 Heart failure, unspecified: Secondary | ICD-10-CM | POA: Diagnosis not present

## 2012-10-17 DIAGNOSIS — J449 Chronic obstructive pulmonary disease, unspecified: Secondary | ICD-10-CM | POA: Diagnosis not present

## 2012-10-17 DIAGNOSIS — I2510 Atherosclerotic heart disease of native coronary artery without angina pectoris: Secondary | ICD-10-CM | POA: Diagnosis not present

## 2012-10-17 DIAGNOSIS — M159 Polyosteoarthritis, unspecified: Secondary | ICD-10-CM | POA: Diagnosis not present

## 2012-10-17 DIAGNOSIS — E119 Type 2 diabetes mellitus without complications: Secondary | ICD-10-CM | POA: Diagnosis not present

## 2012-10-17 DIAGNOSIS — I5033 Acute on chronic diastolic (congestive) heart failure: Secondary | ICD-10-CM | POA: Diagnosis not present

## 2012-10-21 DIAGNOSIS — I509 Heart failure, unspecified: Secondary | ICD-10-CM | POA: Diagnosis not present

## 2012-10-21 DIAGNOSIS — M159 Polyosteoarthritis, unspecified: Secondary | ICD-10-CM | POA: Diagnosis not present

## 2012-10-21 DIAGNOSIS — J449 Chronic obstructive pulmonary disease, unspecified: Secondary | ICD-10-CM | POA: Diagnosis not present

## 2012-10-21 DIAGNOSIS — E119 Type 2 diabetes mellitus without complications: Secondary | ICD-10-CM | POA: Diagnosis not present

## 2012-10-21 DIAGNOSIS — I2510 Atherosclerotic heart disease of native coronary artery without angina pectoris: Secondary | ICD-10-CM | POA: Diagnosis not present

## 2012-10-21 DIAGNOSIS — I5033 Acute on chronic diastolic (congestive) heart failure: Secondary | ICD-10-CM | POA: Diagnosis not present

## 2012-10-22 DIAGNOSIS — I509 Heart failure, unspecified: Secondary | ICD-10-CM | POA: Diagnosis not present

## 2012-10-22 DIAGNOSIS — I5033 Acute on chronic diastolic (congestive) heart failure: Secondary | ICD-10-CM | POA: Diagnosis not present

## 2012-10-22 DIAGNOSIS — I2510 Atherosclerotic heart disease of native coronary artery without angina pectoris: Secondary | ICD-10-CM | POA: Diagnosis not present

## 2012-10-22 DIAGNOSIS — M159 Polyosteoarthritis, unspecified: Secondary | ICD-10-CM | POA: Diagnosis not present

## 2012-10-22 DIAGNOSIS — J449 Chronic obstructive pulmonary disease, unspecified: Secondary | ICD-10-CM | POA: Diagnosis not present

## 2012-10-22 DIAGNOSIS — E119 Type 2 diabetes mellitus without complications: Secondary | ICD-10-CM | POA: Diagnosis not present

## 2012-10-23 DIAGNOSIS — I509 Heart failure, unspecified: Secondary | ICD-10-CM | POA: Diagnosis not present

## 2012-10-23 DIAGNOSIS — I2510 Atherosclerotic heart disease of native coronary artery without angina pectoris: Secondary | ICD-10-CM | POA: Diagnosis not present

## 2012-10-23 DIAGNOSIS — E119 Type 2 diabetes mellitus without complications: Secondary | ICD-10-CM | POA: Diagnosis not present

## 2012-10-23 DIAGNOSIS — M159 Polyosteoarthritis, unspecified: Secondary | ICD-10-CM | POA: Diagnosis not present

## 2012-10-23 DIAGNOSIS — J449 Chronic obstructive pulmonary disease, unspecified: Secondary | ICD-10-CM | POA: Diagnosis not present

## 2012-10-23 DIAGNOSIS — I5033 Acute on chronic diastolic (congestive) heart failure: Secondary | ICD-10-CM | POA: Diagnosis not present

## 2012-10-24 DIAGNOSIS — G4733 Obstructive sleep apnea (adult) (pediatric): Secondary | ICD-10-CM | POA: Diagnosis not present

## 2012-10-24 DIAGNOSIS — I119 Hypertensive heart disease without heart failure: Secondary | ICD-10-CM | POA: Diagnosis not present

## 2012-10-24 DIAGNOSIS — I2510 Atherosclerotic heart disease of native coronary artery without angina pectoris: Secondary | ICD-10-CM | POA: Diagnosis not present

## 2012-10-24 DIAGNOSIS — E109 Type 1 diabetes mellitus without complications: Secondary | ICD-10-CM | POA: Diagnosis not present

## 2012-10-28 DIAGNOSIS — E119 Type 2 diabetes mellitus without complications: Secondary | ICD-10-CM | POA: Diagnosis not present

## 2012-10-28 DIAGNOSIS — I5033 Acute on chronic diastolic (congestive) heart failure: Secondary | ICD-10-CM | POA: Diagnosis not present

## 2012-10-28 DIAGNOSIS — I2510 Atherosclerotic heart disease of native coronary artery without angina pectoris: Secondary | ICD-10-CM | POA: Diagnosis not present

## 2012-10-28 DIAGNOSIS — I509 Heart failure, unspecified: Secondary | ICD-10-CM | POA: Diagnosis not present

## 2012-10-28 DIAGNOSIS — M159 Polyosteoarthritis, unspecified: Secondary | ICD-10-CM | POA: Diagnosis not present

## 2012-10-28 DIAGNOSIS — J449 Chronic obstructive pulmonary disease, unspecified: Secondary | ICD-10-CM | POA: Diagnosis not present

## 2012-10-30 DIAGNOSIS — E1059 Type 1 diabetes mellitus with other circulatory complications: Secondary | ICD-10-CM | POA: Diagnosis not present

## 2012-10-30 DIAGNOSIS — I739 Peripheral vascular disease, unspecified: Secondary | ICD-10-CM | POA: Diagnosis not present

## 2012-10-30 DIAGNOSIS — J449 Chronic obstructive pulmonary disease, unspecified: Secondary | ICD-10-CM | POA: Diagnosis not present

## 2012-10-30 DIAGNOSIS — I5033 Acute on chronic diastolic (congestive) heart failure: Secondary | ICD-10-CM | POA: Diagnosis not present

## 2012-10-30 DIAGNOSIS — L608 Other nail disorders: Secondary | ICD-10-CM | POA: Diagnosis not present

## 2012-10-30 DIAGNOSIS — I2510 Atherosclerotic heart disease of native coronary artery without angina pectoris: Secondary | ICD-10-CM | POA: Diagnosis not present

## 2012-10-30 DIAGNOSIS — E119 Type 2 diabetes mellitus without complications: Secondary | ICD-10-CM | POA: Diagnosis not present

## 2012-10-30 DIAGNOSIS — I509 Heart failure, unspecified: Secondary | ICD-10-CM | POA: Diagnosis not present

## 2012-10-30 DIAGNOSIS — L84 Corns and callosities: Secondary | ICD-10-CM | POA: Diagnosis not present

## 2012-10-30 DIAGNOSIS — M159 Polyosteoarthritis, unspecified: Secondary | ICD-10-CM | POA: Diagnosis not present

## 2012-10-31 DIAGNOSIS — I509 Heart failure, unspecified: Secondary | ICD-10-CM | POA: Diagnosis not present

## 2012-10-31 DIAGNOSIS — I5033 Acute on chronic diastolic (congestive) heart failure: Secondary | ICD-10-CM | POA: Diagnosis not present

## 2012-10-31 DIAGNOSIS — E119 Type 2 diabetes mellitus without complications: Secondary | ICD-10-CM | POA: Diagnosis not present

## 2012-10-31 DIAGNOSIS — J449 Chronic obstructive pulmonary disease, unspecified: Secondary | ICD-10-CM | POA: Diagnosis not present

## 2012-10-31 DIAGNOSIS — I2510 Atherosclerotic heart disease of native coronary artery without angina pectoris: Secondary | ICD-10-CM | POA: Diagnosis not present

## 2012-10-31 DIAGNOSIS — M159 Polyosteoarthritis, unspecified: Secondary | ICD-10-CM | POA: Diagnosis not present

## 2012-11-04 DIAGNOSIS — I509 Heart failure, unspecified: Secondary | ICD-10-CM | POA: Diagnosis not present

## 2012-11-04 DIAGNOSIS — M159 Polyosteoarthritis, unspecified: Secondary | ICD-10-CM | POA: Diagnosis not present

## 2012-11-04 DIAGNOSIS — J449 Chronic obstructive pulmonary disease, unspecified: Secondary | ICD-10-CM | POA: Diagnosis not present

## 2012-11-04 DIAGNOSIS — I2510 Atherosclerotic heart disease of native coronary artery without angina pectoris: Secondary | ICD-10-CM | POA: Diagnosis not present

## 2012-11-04 DIAGNOSIS — E119 Type 2 diabetes mellitus without complications: Secondary | ICD-10-CM | POA: Diagnosis not present

## 2012-11-04 DIAGNOSIS — I5033 Acute on chronic diastolic (congestive) heart failure: Secondary | ICD-10-CM | POA: Diagnosis not present

## 2012-11-06 DIAGNOSIS — I5033 Acute on chronic diastolic (congestive) heart failure: Secondary | ICD-10-CM | POA: Diagnosis not present

## 2012-11-06 DIAGNOSIS — I2510 Atherosclerotic heart disease of native coronary artery without angina pectoris: Secondary | ICD-10-CM | POA: Diagnosis not present

## 2012-11-06 DIAGNOSIS — J449 Chronic obstructive pulmonary disease, unspecified: Secondary | ICD-10-CM | POA: Diagnosis not present

## 2012-11-06 DIAGNOSIS — I509 Heart failure, unspecified: Secondary | ICD-10-CM | POA: Diagnosis not present

## 2012-11-06 DIAGNOSIS — M159 Polyosteoarthritis, unspecified: Secondary | ICD-10-CM | POA: Diagnosis not present

## 2012-11-06 DIAGNOSIS — E119 Type 2 diabetes mellitus without complications: Secondary | ICD-10-CM | POA: Diagnosis not present

## 2012-11-10 DIAGNOSIS — I509 Heart failure, unspecified: Secondary | ICD-10-CM | POA: Diagnosis not present

## 2012-11-10 DIAGNOSIS — E119 Type 2 diabetes mellitus without complications: Secondary | ICD-10-CM | POA: Diagnosis not present

## 2012-11-10 DIAGNOSIS — I5033 Acute on chronic diastolic (congestive) heart failure: Secondary | ICD-10-CM | POA: Diagnosis not present

## 2012-11-10 DIAGNOSIS — J449 Chronic obstructive pulmonary disease, unspecified: Secondary | ICD-10-CM | POA: Diagnosis not present

## 2012-11-10 DIAGNOSIS — M159 Polyosteoarthritis, unspecified: Secondary | ICD-10-CM | POA: Diagnosis not present

## 2012-11-10 DIAGNOSIS — I2510 Atherosclerotic heart disease of native coronary artery without angina pectoris: Secondary | ICD-10-CM | POA: Diagnosis not present

## 2012-11-13 DIAGNOSIS — M159 Polyosteoarthritis, unspecified: Secondary | ICD-10-CM | POA: Diagnosis not present

## 2012-11-13 DIAGNOSIS — I509 Heart failure, unspecified: Secondary | ICD-10-CM | POA: Diagnosis not present

## 2012-11-13 DIAGNOSIS — I2510 Atherosclerotic heart disease of native coronary artery without angina pectoris: Secondary | ICD-10-CM | POA: Diagnosis not present

## 2012-11-13 DIAGNOSIS — E119 Type 2 diabetes mellitus without complications: Secondary | ICD-10-CM | POA: Diagnosis not present

## 2012-11-13 DIAGNOSIS — J449 Chronic obstructive pulmonary disease, unspecified: Secondary | ICD-10-CM | POA: Diagnosis not present

## 2012-11-13 DIAGNOSIS — I5033 Acute on chronic diastolic (congestive) heart failure: Secondary | ICD-10-CM | POA: Diagnosis not present

## 2012-11-14 DIAGNOSIS — I498 Other specified cardiac arrhythmias: Secondary | ICD-10-CM | POA: Diagnosis not present

## 2012-11-14 DIAGNOSIS — E1129 Type 2 diabetes mellitus with other diabetic kidney complication: Secondary | ICD-10-CM | POA: Diagnosis not present

## 2012-11-14 DIAGNOSIS — R42 Dizziness and giddiness: Secondary | ICD-10-CM | POA: Diagnosis not present

## 2012-11-14 DIAGNOSIS — E119 Type 2 diabetes mellitus without complications: Secondary | ICD-10-CM | POA: Diagnosis not present

## 2012-11-14 DIAGNOSIS — E1165 Type 2 diabetes mellitus with hyperglycemia: Secondary | ICD-10-CM | POA: Diagnosis not present

## 2012-11-14 DIAGNOSIS — J209 Acute bronchitis, unspecified: Secondary | ICD-10-CM | POA: Diagnosis not present

## 2012-11-14 DIAGNOSIS — I2510 Atherosclerotic heart disease of native coronary artery without angina pectoris: Secondary | ICD-10-CM | POA: Diagnosis not present

## 2012-11-17 DIAGNOSIS — E119 Type 2 diabetes mellitus without complications: Secondary | ICD-10-CM | POA: Diagnosis not present

## 2012-11-17 DIAGNOSIS — M159 Polyosteoarthritis, unspecified: Secondary | ICD-10-CM | POA: Diagnosis not present

## 2012-11-17 DIAGNOSIS — I2510 Atherosclerotic heart disease of native coronary artery without angina pectoris: Secondary | ICD-10-CM | POA: Diagnosis not present

## 2012-11-17 DIAGNOSIS — I509 Heart failure, unspecified: Secondary | ICD-10-CM | POA: Diagnosis not present

## 2012-11-17 DIAGNOSIS — I5033 Acute on chronic diastolic (congestive) heart failure: Secondary | ICD-10-CM | POA: Diagnosis not present

## 2012-11-17 DIAGNOSIS — J449 Chronic obstructive pulmonary disease, unspecified: Secondary | ICD-10-CM | POA: Diagnosis not present

## 2012-11-19 DIAGNOSIS — M159 Polyosteoarthritis, unspecified: Secondary | ICD-10-CM | POA: Diagnosis not present

## 2012-11-19 DIAGNOSIS — I5033 Acute on chronic diastolic (congestive) heart failure: Secondary | ICD-10-CM | POA: Diagnosis not present

## 2012-11-19 DIAGNOSIS — I2510 Atherosclerotic heart disease of native coronary artery without angina pectoris: Secondary | ICD-10-CM | POA: Diagnosis not present

## 2012-11-19 DIAGNOSIS — J449 Chronic obstructive pulmonary disease, unspecified: Secondary | ICD-10-CM | POA: Diagnosis not present

## 2012-11-19 DIAGNOSIS — E119 Type 2 diabetes mellitus without complications: Secondary | ICD-10-CM | POA: Diagnosis not present

## 2012-11-19 DIAGNOSIS — I442 Atrioventricular block, complete: Secondary | ICD-10-CM

## 2012-11-19 DIAGNOSIS — I509 Heart failure, unspecified: Secondary | ICD-10-CM | POA: Diagnosis not present

## 2012-11-19 HISTORY — DX: Atrioventricular block, complete: I44.2

## 2012-11-20 DIAGNOSIS — I5033 Acute on chronic diastolic (congestive) heart failure: Secondary | ICD-10-CM | POA: Diagnosis not present

## 2012-11-20 DIAGNOSIS — J449 Chronic obstructive pulmonary disease, unspecified: Secondary | ICD-10-CM | POA: Diagnosis not present

## 2012-11-20 DIAGNOSIS — E119 Type 2 diabetes mellitus without complications: Secondary | ICD-10-CM | POA: Diagnosis not present

## 2012-11-20 DIAGNOSIS — I509 Heart failure, unspecified: Secondary | ICD-10-CM | POA: Diagnosis not present

## 2012-11-20 DIAGNOSIS — M159 Polyosteoarthritis, unspecified: Secondary | ICD-10-CM | POA: Diagnosis not present

## 2012-11-20 DIAGNOSIS — I2510 Atherosclerotic heart disease of native coronary artery without angina pectoris: Secondary | ICD-10-CM | POA: Diagnosis not present

## 2012-11-21 DIAGNOSIS — M159 Polyosteoarthritis, unspecified: Secondary | ICD-10-CM | POA: Diagnosis not present

## 2012-11-21 DIAGNOSIS — I2510 Atherosclerotic heart disease of native coronary artery without angina pectoris: Secondary | ICD-10-CM | POA: Diagnosis not present

## 2012-11-21 DIAGNOSIS — I509 Heart failure, unspecified: Secondary | ICD-10-CM | POA: Diagnosis not present

## 2012-11-21 DIAGNOSIS — I5033 Acute on chronic diastolic (congestive) heart failure: Secondary | ICD-10-CM | POA: Diagnosis not present

## 2012-11-21 DIAGNOSIS — J449 Chronic obstructive pulmonary disease, unspecified: Secondary | ICD-10-CM | POA: Diagnosis not present

## 2012-11-21 DIAGNOSIS — E119 Type 2 diabetes mellitus without complications: Secondary | ICD-10-CM | POA: Diagnosis not present

## 2012-11-25 DIAGNOSIS — I2510 Atherosclerotic heart disease of native coronary artery without angina pectoris: Secondary | ICD-10-CM | POA: Diagnosis not present

## 2012-11-25 DIAGNOSIS — M159 Polyosteoarthritis, unspecified: Secondary | ICD-10-CM | POA: Diagnosis not present

## 2012-11-25 DIAGNOSIS — I509 Heart failure, unspecified: Secondary | ICD-10-CM | POA: Diagnosis not present

## 2012-11-25 DIAGNOSIS — J449 Chronic obstructive pulmonary disease, unspecified: Secondary | ICD-10-CM | POA: Diagnosis not present

## 2012-11-25 DIAGNOSIS — I5033 Acute on chronic diastolic (congestive) heart failure: Secondary | ICD-10-CM | POA: Diagnosis not present

## 2012-11-25 DIAGNOSIS — E119 Type 2 diabetes mellitus without complications: Secondary | ICD-10-CM | POA: Diagnosis not present

## 2012-11-27 DIAGNOSIS — I5033 Acute on chronic diastolic (congestive) heart failure: Secondary | ICD-10-CM | POA: Diagnosis not present

## 2012-11-27 DIAGNOSIS — J449 Chronic obstructive pulmonary disease, unspecified: Secondary | ICD-10-CM | POA: Diagnosis not present

## 2012-11-27 DIAGNOSIS — I509 Heart failure, unspecified: Secondary | ICD-10-CM | POA: Diagnosis not present

## 2012-11-27 DIAGNOSIS — I2510 Atherosclerotic heart disease of native coronary artery without angina pectoris: Secondary | ICD-10-CM | POA: Diagnosis not present

## 2012-11-27 DIAGNOSIS — E119 Type 2 diabetes mellitus without complications: Secondary | ICD-10-CM | POA: Diagnosis not present

## 2012-11-27 DIAGNOSIS — M159 Polyosteoarthritis, unspecified: Secondary | ICD-10-CM | POA: Diagnosis not present

## 2012-12-01 DIAGNOSIS — M159 Polyosteoarthritis, unspecified: Secondary | ICD-10-CM | POA: Diagnosis not present

## 2012-12-01 DIAGNOSIS — E119 Type 2 diabetes mellitus without complications: Secondary | ICD-10-CM | POA: Diagnosis not present

## 2012-12-01 DIAGNOSIS — I5033 Acute on chronic diastolic (congestive) heart failure: Secondary | ICD-10-CM | POA: Diagnosis not present

## 2012-12-01 DIAGNOSIS — I2510 Atherosclerotic heart disease of native coronary artery without angina pectoris: Secondary | ICD-10-CM | POA: Diagnosis not present

## 2012-12-01 DIAGNOSIS — J449 Chronic obstructive pulmonary disease, unspecified: Secondary | ICD-10-CM | POA: Diagnosis not present

## 2012-12-01 DIAGNOSIS — I509 Heart failure, unspecified: Secondary | ICD-10-CM | POA: Diagnosis not present

## 2012-12-02 DIAGNOSIS — I2510 Atherosclerotic heart disease of native coronary artery without angina pectoris: Secondary | ICD-10-CM | POA: Diagnosis not present

## 2012-12-02 DIAGNOSIS — M159 Polyosteoarthritis, unspecified: Secondary | ICD-10-CM | POA: Diagnosis not present

## 2012-12-02 DIAGNOSIS — J449 Chronic obstructive pulmonary disease, unspecified: Secondary | ICD-10-CM | POA: Diagnosis not present

## 2012-12-02 DIAGNOSIS — E119 Type 2 diabetes mellitus without complications: Secondary | ICD-10-CM | POA: Diagnosis not present

## 2012-12-02 DIAGNOSIS — I509 Heart failure, unspecified: Secondary | ICD-10-CM | POA: Diagnosis not present

## 2012-12-02 DIAGNOSIS — I5033 Acute on chronic diastolic (congestive) heart failure: Secondary | ICD-10-CM | POA: Diagnosis not present

## 2012-12-04 DIAGNOSIS — E119 Type 2 diabetes mellitus without complications: Secondary | ICD-10-CM | POA: Diagnosis not present

## 2012-12-04 DIAGNOSIS — J449 Chronic obstructive pulmonary disease, unspecified: Secondary | ICD-10-CM | POA: Diagnosis not present

## 2012-12-04 DIAGNOSIS — I509 Heart failure, unspecified: Secondary | ICD-10-CM | POA: Diagnosis not present

## 2012-12-04 DIAGNOSIS — M159 Polyosteoarthritis, unspecified: Secondary | ICD-10-CM | POA: Diagnosis not present

## 2012-12-04 DIAGNOSIS — I5033 Acute on chronic diastolic (congestive) heart failure: Secondary | ICD-10-CM | POA: Diagnosis not present

## 2012-12-04 DIAGNOSIS — I2510 Atherosclerotic heart disease of native coronary artery without angina pectoris: Secondary | ICD-10-CM | POA: Diagnosis not present

## 2012-12-08 DIAGNOSIS — I509 Heart failure, unspecified: Secondary | ICD-10-CM | POA: Diagnosis not present

## 2012-12-08 DIAGNOSIS — I2510 Atherosclerotic heart disease of native coronary artery without angina pectoris: Secondary | ICD-10-CM | POA: Diagnosis not present

## 2012-12-08 DIAGNOSIS — M159 Polyosteoarthritis, unspecified: Secondary | ICD-10-CM | POA: Diagnosis not present

## 2012-12-08 DIAGNOSIS — I5033 Acute on chronic diastolic (congestive) heart failure: Secondary | ICD-10-CM | POA: Diagnosis not present

## 2012-12-08 DIAGNOSIS — J449 Chronic obstructive pulmonary disease, unspecified: Secondary | ICD-10-CM | POA: Diagnosis not present

## 2012-12-08 DIAGNOSIS — E119 Type 2 diabetes mellitus without complications: Secondary | ICD-10-CM | POA: Diagnosis not present

## 2012-12-11 DIAGNOSIS — M159 Polyosteoarthritis, unspecified: Secondary | ICD-10-CM | POA: Diagnosis not present

## 2012-12-11 DIAGNOSIS — E119 Type 2 diabetes mellitus without complications: Secondary | ICD-10-CM | POA: Diagnosis not present

## 2012-12-11 DIAGNOSIS — I5033 Acute on chronic diastolic (congestive) heart failure: Secondary | ICD-10-CM | POA: Diagnosis not present

## 2012-12-11 DIAGNOSIS — I2510 Atherosclerotic heart disease of native coronary artery without angina pectoris: Secondary | ICD-10-CM | POA: Diagnosis not present

## 2012-12-11 DIAGNOSIS — J449 Chronic obstructive pulmonary disease, unspecified: Secondary | ICD-10-CM | POA: Diagnosis not present

## 2012-12-11 DIAGNOSIS — I509 Heart failure, unspecified: Secondary | ICD-10-CM | POA: Diagnosis not present

## 2012-12-12 DIAGNOSIS — E119 Type 2 diabetes mellitus without complications: Secondary | ICD-10-CM | POA: Diagnosis not present

## 2012-12-12 DIAGNOSIS — I509 Heart failure, unspecified: Secondary | ICD-10-CM | POA: Diagnosis not present

## 2012-12-12 DIAGNOSIS — I2510 Atherosclerotic heart disease of native coronary artery without angina pectoris: Secondary | ICD-10-CM | POA: Diagnosis not present

## 2012-12-12 DIAGNOSIS — J449 Chronic obstructive pulmonary disease, unspecified: Secondary | ICD-10-CM | POA: Diagnosis not present

## 2012-12-12 DIAGNOSIS — M159 Polyosteoarthritis, unspecified: Secondary | ICD-10-CM | POA: Diagnosis not present

## 2012-12-12 DIAGNOSIS — I5033 Acute on chronic diastolic (congestive) heart failure: Secondary | ICD-10-CM | POA: Diagnosis not present

## 2012-12-15 DIAGNOSIS — I2510 Atherosclerotic heart disease of native coronary artery without angina pectoris: Secondary | ICD-10-CM | POA: Diagnosis not present

## 2012-12-15 DIAGNOSIS — I509 Heart failure, unspecified: Secondary | ICD-10-CM | POA: Diagnosis not present

## 2012-12-15 DIAGNOSIS — J449 Chronic obstructive pulmonary disease, unspecified: Secondary | ICD-10-CM | POA: Diagnosis not present

## 2012-12-15 DIAGNOSIS — E119 Type 2 diabetes mellitus without complications: Secondary | ICD-10-CM | POA: Diagnosis not present

## 2012-12-15 DIAGNOSIS — I5033 Acute on chronic diastolic (congestive) heart failure: Secondary | ICD-10-CM | POA: Diagnosis not present

## 2012-12-15 DIAGNOSIS — M159 Polyosteoarthritis, unspecified: Secondary | ICD-10-CM | POA: Diagnosis not present

## 2012-12-18 DIAGNOSIS — E119 Type 2 diabetes mellitus without complications: Secondary | ICD-10-CM | POA: Diagnosis not present

## 2012-12-18 DIAGNOSIS — I2510 Atherosclerotic heart disease of native coronary artery without angina pectoris: Secondary | ICD-10-CM | POA: Diagnosis not present

## 2012-12-18 DIAGNOSIS — I5033 Acute on chronic diastolic (congestive) heart failure: Secondary | ICD-10-CM | POA: Diagnosis not present

## 2012-12-18 DIAGNOSIS — I509 Heart failure, unspecified: Secondary | ICD-10-CM | POA: Diagnosis not present

## 2012-12-18 DIAGNOSIS — M159 Polyosteoarthritis, unspecified: Secondary | ICD-10-CM | POA: Diagnosis not present

## 2012-12-18 DIAGNOSIS — J449 Chronic obstructive pulmonary disease, unspecified: Secondary | ICD-10-CM | POA: Diagnosis not present

## 2012-12-19 DIAGNOSIS — I2510 Atherosclerotic heart disease of native coronary artery without angina pectoris: Secondary | ICD-10-CM | POA: Diagnosis not present

## 2012-12-19 DIAGNOSIS — E119 Type 2 diabetes mellitus without complications: Secondary | ICD-10-CM | POA: Diagnosis not present

## 2012-12-19 DIAGNOSIS — J449 Chronic obstructive pulmonary disease, unspecified: Secondary | ICD-10-CM | POA: Diagnosis not present

## 2012-12-19 DIAGNOSIS — I509 Heart failure, unspecified: Secondary | ICD-10-CM | POA: Diagnosis not present

## 2012-12-19 DIAGNOSIS — M159 Polyosteoarthritis, unspecified: Secondary | ICD-10-CM | POA: Diagnosis not present

## 2012-12-19 DIAGNOSIS — I5033 Acute on chronic diastolic (congestive) heart failure: Secondary | ICD-10-CM | POA: Diagnosis not present

## 2012-12-22 DIAGNOSIS — I509 Heart failure, unspecified: Secondary | ICD-10-CM | POA: Diagnosis not present

## 2012-12-22 DIAGNOSIS — E119 Type 2 diabetes mellitus without complications: Secondary | ICD-10-CM | POA: Diagnosis not present

## 2012-12-22 DIAGNOSIS — M159 Polyosteoarthritis, unspecified: Secondary | ICD-10-CM | POA: Diagnosis not present

## 2012-12-22 DIAGNOSIS — I5033 Acute on chronic diastolic (congestive) heart failure: Secondary | ICD-10-CM | POA: Diagnosis not present

## 2012-12-22 DIAGNOSIS — I2510 Atherosclerotic heart disease of native coronary artery without angina pectoris: Secondary | ICD-10-CM | POA: Diagnosis not present

## 2012-12-22 DIAGNOSIS — J449 Chronic obstructive pulmonary disease, unspecified: Secondary | ICD-10-CM | POA: Diagnosis not present

## 2012-12-24 DIAGNOSIS — R82998 Other abnormal findings in urine: Secondary | ICD-10-CM | POA: Diagnosis not present

## 2012-12-24 DIAGNOSIS — E782 Mixed hyperlipidemia: Secondary | ICD-10-CM | POA: Diagnosis not present

## 2012-12-24 DIAGNOSIS — G4733 Obstructive sleep apnea (adult) (pediatric): Secondary | ICD-10-CM | POA: Diagnosis not present

## 2012-12-24 DIAGNOSIS — I2510 Atherosclerotic heart disease of native coronary artery without angina pectoris: Secondary | ICD-10-CM | POA: Diagnosis not present

## 2012-12-24 DIAGNOSIS — R609 Edema, unspecified: Secondary | ICD-10-CM | POA: Diagnosis not present

## 2012-12-24 DIAGNOSIS — E7800 Pure hypercholesterolemia, unspecified: Secondary | ICD-10-CM | POA: Diagnosis not present

## 2012-12-24 DIAGNOSIS — E049 Nontoxic goiter, unspecified: Secondary | ICD-10-CM | POA: Diagnosis not present

## 2012-12-25 ENCOUNTER — Other Ambulatory Visit (HOSPITAL_COMMUNITY): Payer: Self-pay | Admitting: Cardiovascular Disease

## 2012-12-25 DIAGNOSIS — I2510 Atherosclerotic heart disease of native coronary artery without angina pectoris: Secondary | ICD-10-CM

## 2012-12-25 DIAGNOSIS — I5033 Acute on chronic diastolic (congestive) heart failure: Secondary | ICD-10-CM | POA: Diagnosis not present

## 2012-12-25 DIAGNOSIS — E119 Type 2 diabetes mellitus without complications: Secondary | ICD-10-CM | POA: Diagnosis not present

## 2012-12-25 DIAGNOSIS — M159 Polyosteoarthritis, unspecified: Secondary | ICD-10-CM | POA: Diagnosis not present

## 2012-12-25 DIAGNOSIS — I509 Heart failure, unspecified: Secondary | ICD-10-CM | POA: Diagnosis not present

## 2012-12-25 DIAGNOSIS — J449 Chronic obstructive pulmonary disease, unspecified: Secondary | ICD-10-CM | POA: Diagnosis not present

## 2012-12-26 DIAGNOSIS — N2581 Secondary hyperparathyroidism of renal origin: Secondary | ICD-10-CM | POA: Diagnosis not present

## 2012-12-26 DIAGNOSIS — N183 Chronic kidney disease, stage 3 unspecified: Secondary | ICD-10-CM | POA: Diagnosis not present

## 2012-12-29 DIAGNOSIS — I10 Essential (primary) hypertension: Secondary | ICD-10-CM | POA: Diagnosis not present

## 2012-12-29 DIAGNOSIS — E7800 Pure hypercholesterolemia, unspecified: Secondary | ICD-10-CM | POA: Diagnosis not present

## 2012-12-29 DIAGNOSIS — E049 Nontoxic goiter, unspecified: Secondary | ICD-10-CM | POA: Diagnosis not present

## 2012-12-30 DIAGNOSIS — E119 Type 2 diabetes mellitus without complications: Secondary | ICD-10-CM | POA: Diagnosis not present

## 2012-12-30 DIAGNOSIS — I509 Heart failure, unspecified: Secondary | ICD-10-CM | POA: Diagnosis not present

## 2012-12-30 DIAGNOSIS — J449 Chronic obstructive pulmonary disease, unspecified: Secondary | ICD-10-CM | POA: Diagnosis not present

## 2012-12-30 DIAGNOSIS — I2510 Atherosclerotic heart disease of native coronary artery without angina pectoris: Secondary | ICD-10-CM | POA: Diagnosis not present

## 2012-12-30 DIAGNOSIS — M159 Polyosteoarthritis, unspecified: Secondary | ICD-10-CM | POA: Diagnosis not present

## 2012-12-30 DIAGNOSIS — I5033 Acute on chronic diastolic (congestive) heart failure: Secondary | ICD-10-CM | POA: Diagnosis not present

## 2013-01-02 ENCOUNTER — Encounter (HOSPITAL_COMMUNITY): Payer: Medicare Other

## 2013-01-02 DIAGNOSIS — I2510 Atherosclerotic heart disease of native coronary artery without angina pectoris: Secondary | ICD-10-CM | POA: Diagnosis not present

## 2013-01-02 DIAGNOSIS — E119 Type 2 diabetes mellitus without complications: Secondary | ICD-10-CM | POA: Diagnosis not present

## 2013-01-02 DIAGNOSIS — I5033 Acute on chronic diastolic (congestive) heart failure: Secondary | ICD-10-CM | POA: Diagnosis not present

## 2013-01-02 DIAGNOSIS — J449 Chronic obstructive pulmonary disease, unspecified: Secondary | ICD-10-CM | POA: Diagnosis not present

## 2013-01-02 DIAGNOSIS — I509 Heart failure, unspecified: Secondary | ICD-10-CM | POA: Diagnosis not present

## 2013-01-02 DIAGNOSIS — M159 Polyosteoarthritis, unspecified: Secondary | ICD-10-CM | POA: Diagnosis not present

## 2013-01-06 DIAGNOSIS — I5033 Acute on chronic diastolic (congestive) heart failure: Secondary | ICD-10-CM | POA: Diagnosis not present

## 2013-01-06 DIAGNOSIS — J449 Chronic obstructive pulmonary disease, unspecified: Secondary | ICD-10-CM | POA: Diagnosis not present

## 2013-01-06 DIAGNOSIS — E119 Type 2 diabetes mellitus without complications: Secondary | ICD-10-CM | POA: Diagnosis not present

## 2013-01-06 DIAGNOSIS — M159 Polyosteoarthritis, unspecified: Secondary | ICD-10-CM | POA: Diagnosis not present

## 2013-01-06 DIAGNOSIS — I509 Heart failure, unspecified: Secondary | ICD-10-CM | POA: Diagnosis not present

## 2013-01-06 DIAGNOSIS — I2510 Atherosclerotic heart disease of native coronary artery without angina pectoris: Secondary | ICD-10-CM | POA: Diagnosis not present

## 2013-01-08 DIAGNOSIS — I509 Heart failure, unspecified: Secondary | ICD-10-CM | POA: Diagnosis not present

## 2013-01-08 DIAGNOSIS — I2510 Atherosclerotic heart disease of native coronary artery without angina pectoris: Secondary | ICD-10-CM | POA: Diagnosis not present

## 2013-01-08 DIAGNOSIS — I5033 Acute on chronic diastolic (congestive) heart failure: Secondary | ICD-10-CM | POA: Diagnosis not present

## 2013-01-08 DIAGNOSIS — M159 Polyosteoarthritis, unspecified: Secondary | ICD-10-CM | POA: Diagnosis not present

## 2013-01-08 DIAGNOSIS — J449 Chronic obstructive pulmonary disease, unspecified: Secondary | ICD-10-CM | POA: Diagnosis not present

## 2013-01-08 DIAGNOSIS — E119 Type 2 diabetes mellitus without complications: Secondary | ICD-10-CM | POA: Diagnosis not present

## 2013-01-09 DIAGNOSIS — J449 Chronic obstructive pulmonary disease, unspecified: Secondary | ICD-10-CM | POA: Diagnosis not present

## 2013-01-09 DIAGNOSIS — I2510 Atherosclerotic heart disease of native coronary artery without angina pectoris: Secondary | ICD-10-CM | POA: Diagnosis not present

## 2013-01-09 DIAGNOSIS — M159 Polyosteoarthritis, unspecified: Secondary | ICD-10-CM | POA: Diagnosis not present

## 2013-01-09 DIAGNOSIS — E119 Type 2 diabetes mellitus without complications: Secondary | ICD-10-CM | POA: Diagnosis not present

## 2013-01-09 DIAGNOSIS — I509 Heart failure, unspecified: Secondary | ICD-10-CM | POA: Diagnosis not present

## 2013-01-09 DIAGNOSIS — I5033 Acute on chronic diastolic (congestive) heart failure: Secondary | ICD-10-CM | POA: Diagnosis not present

## 2013-01-12 DIAGNOSIS — M159 Polyosteoarthritis, unspecified: Secondary | ICD-10-CM | POA: Diagnosis not present

## 2013-01-12 DIAGNOSIS — E119 Type 2 diabetes mellitus without complications: Secondary | ICD-10-CM | POA: Diagnosis not present

## 2013-01-12 DIAGNOSIS — I2510 Atherosclerotic heart disease of native coronary artery without angina pectoris: Secondary | ICD-10-CM | POA: Diagnosis not present

## 2013-01-12 DIAGNOSIS — J449 Chronic obstructive pulmonary disease, unspecified: Secondary | ICD-10-CM | POA: Diagnosis not present

## 2013-01-12 DIAGNOSIS — I5033 Acute on chronic diastolic (congestive) heart failure: Secondary | ICD-10-CM | POA: Diagnosis not present

## 2013-01-12 DIAGNOSIS — I509 Heart failure, unspecified: Secondary | ICD-10-CM | POA: Diagnosis not present

## 2013-01-13 DIAGNOSIS — M159 Polyosteoarthritis, unspecified: Secondary | ICD-10-CM | POA: Diagnosis not present

## 2013-01-13 DIAGNOSIS — E119 Type 2 diabetes mellitus without complications: Secondary | ICD-10-CM | POA: Diagnosis not present

## 2013-01-13 DIAGNOSIS — J449 Chronic obstructive pulmonary disease, unspecified: Secondary | ICD-10-CM | POA: Diagnosis not present

## 2013-01-13 DIAGNOSIS — I5033 Acute on chronic diastolic (congestive) heart failure: Secondary | ICD-10-CM | POA: Diagnosis not present

## 2013-01-13 DIAGNOSIS — I509 Heart failure, unspecified: Secondary | ICD-10-CM | POA: Diagnosis not present

## 2013-01-13 DIAGNOSIS — I2510 Atherosclerotic heart disease of native coronary artery without angina pectoris: Secondary | ICD-10-CM | POA: Diagnosis not present

## 2013-01-14 ENCOUNTER — Ambulatory Visit (HOSPITAL_COMMUNITY)
Admission: RE | Admit: 2013-01-14 | Discharge: 2013-01-14 | Disposition: A | Discharge status: Home / Self Care | Payer: Medicare Other | Source: Ambulatory Visit | Attending: Cardiovascular Disease | Admitting: Cardiovascular Disease

## 2013-01-14 DIAGNOSIS — R002 Palpitations: Secondary | ICD-10-CM | POA: Insufficient documentation

## 2013-01-14 DIAGNOSIS — E119 Type 2 diabetes mellitus without complications: Secondary | ICD-10-CM | POA: Insufficient documentation

## 2013-01-14 DIAGNOSIS — R0602 Shortness of breath: Secondary | ICD-10-CM | POA: Diagnosis not present

## 2013-01-14 DIAGNOSIS — I2510 Atherosclerotic heart disease of native coronary artery without angina pectoris: Secondary | ICD-10-CM

## 2013-01-14 DIAGNOSIS — I10 Essential (primary) hypertension: Secondary | ICD-10-CM | POA: Insufficient documentation

## 2013-01-14 DIAGNOSIS — J449 Chronic obstructive pulmonary disease, unspecified: Secondary | ICD-10-CM | POA: Insufficient documentation

## 2013-01-14 DIAGNOSIS — J4489 Other specified chronic obstructive pulmonary disease: Secondary | ICD-10-CM | POA: Insufficient documentation

## 2013-01-14 DIAGNOSIS — R079 Chest pain, unspecified: Secondary | ICD-10-CM | POA: Diagnosis not present

## 2013-01-14 DIAGNOSIS — R42 Dizziness and giddiness: Secondary | ICD-10-CM | POA: Diagnosis not present

## 2013-01-14 MED ORDER — TECHNETIUM TC 99M SESTAMIBI GENERIC - CARDIOLITE
10.0000 | Freq: Once | INTRAVENOUS | Status: AC | PRN
  Administered 2013-01-14: 10 via INTRAVENOUS

## 2013-01-14 MED ORDER — TECHNETIUM TC 99M SESTAMIBI GENERIC - CARDIOLITE
30.0000 | Freq: Once | INTRAVENOUS | Status: AC | PRN
  Administered 2013-01-14: 30 via INTRAVENOUS

## 2013-01-14 MED ORDER — AMINOPHYLLINE 25 MG/ML IV SOLN
75.0000 mg | Freq: Once | INTRAVENOUS | Status: AC
  Administered 2013-01-14: 75 mg via INTRAVENOUS

## 2013-01-14 MED ORDER — REGADENOSON 0.4 MG/5ML IV SOLN
0.4000 mg | Freq: Once | INTRAVENOUS | Status: AC
  Administered 2013-01-14: 0.4 mg via INTRAVENOUS

## 2013-01-14 NOTE — Procedures (Addendum)
[facility] [address] [place] [address] [phone]  Cardiology Nuclear Med Study  NHI BUTRUM is a 77 y.o. female     MRN : [id number]     DOB: [birth year]  Procedure Date: 01/14/2013  Nuclear Med Background Indication for Stress Test:  Evaluation for Ischemia, Stent Patency, PTCA Patency and Abnormal EKG History:  COPD and CAD;MI;PTCA-2012;STENT--10/2009 Cardiac Risk Factors: History of Smoking, Hypertension, IDDM Type 2 and Obesity  Symptoms:  Chest Pain, Dizziness, Fatigue, Light-Headedness, Palpitations and SOB   Nuclear Pre-Procedure Caffeine/Decaff Intake:  1:00am NPO After: 11:00am   IV Site: R Antecubital  IV 0.9% NS with Angio Cath:  22g  Chest Size (in):  N/A IV Started by: Emmit Pomfret, RN  Height: 5\' 7"  (1.702 m)  Cup Size: C  BMI:  Body mass index is 35.7 kg/(m^2). Weight:  228 lb (103.42 kg)   Tech Comments:  PT IS ON CONTINUOUS OXYGEN    Nuclear Med Study 1 or 2 day study: 1 day  Stress Test Type:  Lexiscan  Order Authorizing Provider:  Benny Lennert   Resting Radionuclide: Technetium 28m Sestamibi  Resting Radionuclide Dose: 10.6 mCi   Stress Radionuclide:  Technetium 32m Sestamibi  Stress Radionuclide Dose: 30.8 mCi           Stress Protocol Rest HR: 49 64  Rest BP: 147/69 Stress BP: 136/55  Exercise Time (min): n/a METS: n/a          Dose of Adenosine (mg):  n/a Dose of Lexiscan: 0.4 mg  Dose of Atropine (mg): n/a Dose of Dobutamine: n/a mcg/kg/min (at max HR)  Stress Test Technologist: Ernestene Mention, CCT Nuclear Technologist: Koren Shiver, CNMT   Rest Procedure:  Myocardial perfusion imaging was performed at rest 45 minutes following the intravenous administration of Technetium 88m Sestamibi. Stress Procedure:  The patient received IV Lexiscan 0.4 mg over 15-seconds.  Technetium 67m Sestamibi injected at 30-seconds.  Due to patient increased shortness of breath, she was given  IV Aminophylline 75 mg. Symptom was resolved.There were no significant changes with Lexiscan.  Quantitative spect images were obtained after a 45 minute delay.  Transient Ischemic Dilatation (Normal <1.22):  0.95 Lung/Heart Ratio (Normal <0.45):  0.34 QGS EDV:  75 ml QGS ESV:  22 ml LV Ejection Fraction: 71%     Rest ECG: NSR. Anteroseptal infarcttion, old. Lateral ST-T changes  Stress ECG: No significant change from baseline ECG  QPS Raw Data Images:  Normal; no motion artifact; normal heart/lung ratio. Stress Images:  Mild anterior wall reduction intracer uptake. Normal homogeneous uptake in all other areas of the myocardium. Rest Images:  Comparison with the stress images reveals no significant change. Subtraction (SDS):  There is a fixed anterior defect that is most consistent with breast attenuation.  Impression Exercise Capacity:  Lexiscan with no exercise. BP Response:  Normal blood pressure response. Clinical Symptoms:  No significant symptoms noted. ECG Impression:  No significant ST segment change suggestive of ischemia. Comparison with Prior Nuclear Study: Anterior wall ischemia is no longer appreciated, A very mild and fixed anterior defect, not corroborated by a wall motion abnormality, is consistent with attenuation artifact.  Overall Impression:  Low risk stress nuclear study.  LV Wall Motion:  NL LV Function; NL Wall Motion   Laurieann Friddle, MD  01/14/2013 6:15 PM

## 2013-01-15 DIAGNOSIS — I509 Heart failure, unspecified: Secondary | ICD-10-CM | POA: Diagnosis not present

## 2013-01-15 DIAGNOSIS — I2510 Atherosclerotic heart disease of native coronary artery without angina pectoris: Secondary | ICD-10-CM | POA: Diagnosis not present

## 2013-01-15 DIAGNOSIS — E119 Type 2 diabetes mellitus without complications: Secondary | ICD-10-CM | POA: Diagnosis not present

## 2013-01-15 DIAGNOSIS — M159 Polyosteoarthritis, unspecified: Secondary | ICD-10-CM | POA: Diagnosis not present

## 2013-01-15 DIAGNOSIS — J449 Chronic obstructive pulmonary disease, unspecified: Secondary | ICD-10-CM | POA: Diagnosis not present

## 2013-01-15 DIAGNOSIS — I5033 Acute on chronic diastolic (congestive) heart failure: Secondary | ICD-10-CM | POA: Diagnosis not present

## 2013-01-25 ENCOUNTER — Other Ambulatory Visit: Payer: Self-pay

## 2013-01-25 ENCOUNTER — Encounter (HOSPITAL_COMMUNITY): Payer: Self-pay

## 2013-01-25 ENCOUNTER — Emergency Department (HOSPITAL_COMMUNITY): Payer: Medicare Other

## 2013-01-25 ENCOUNTER — Inpatient Hospital Stay (HOSPITAL_COMMUNITY)
Admission: EM | Admit: 2013-01-25 | Discharge: 2013-01-27 | DRG: 310 | Disposition: A | Discharge status: Home Health | Payer: Medicare Other | Attending: Cardiovascular Disease | Admitting: Cardiovascular Disease

## 2013-01-25 DIAGNOSIS — Z6832 Body mass index (BMI) 32.0-32.9, adult: Secondary | ICD-10-CM | POA: Diagnosis not present

## 2013-01-25 DIAGNOSIS — I2510 Atherosclerotic heart disease of native coronary artery without angina pectoris: Secondary | ICD-10-CM | POA: Diagnosis present

## 2013-01-25 DIAGNOSIS — I519 Heart disease, unspecified: Secondary | ICD-10-CM | POA: Diagnosis present

## 2013-01-25 DIAGNOSIS — R079 Chest pain, unspecified: Secondary | ICD-10-CM | POA: Diagnosis not present

## 2013-01-25 DIAGNOSIS — D509 Iron deficiency anemia, unspecified: Secondary | ICD-10-CM | POA: Diagnosis present

## 2013-01-25 DIAGNOSIS — Z87891 Personal history of nicotine dependence: Secondary | ICD-10-CM | POA: Diagnosis not present

## 2013-01-25 DIAGNOSIS — K219 Gastro-esophageal reflux disease without esophagitis: Secondary | ICD-10-CM | POA: Diagnosis present

## 2013-01-25 DIAGNOSIS — J449 Chronic obstructive pulmonary disease, unspecified: Secondary | ICD-10-CM | POA: Diagnosis present

## 2013-01-25 DIAGNOSIS — Z79899 Other long term (current) drug therapy: Secondary | ICD-10-CM | POA: Diagnosis not present

## 2013-01-25 DIAGNOSIS — N183 Chronic kidney disease, stage 3 unspecified: Secondary | ICD-10-CM | POA: Diagnosis present

## 2013-01-25 DIAGNOSIS — E119 Type 2 diabetes mellitus without complications: Secondary | ICD-10-CM | POA: Diagnosis not present

## 2013-01-25 DIAGNOSIS — Z885 Allergy status to narcotic agent status: Secondary | ICD-10-CM

## 2013-01-25 DIAGNOSIS — R072 Precordial pain: Secondary | ICD-10-CM | POA: Diagnosis not present

## 2013-01-25 DIAGNOSIS — I509 Heart failure, unspecified: Secondary | ICD-10-CM | POA: Diagnosis present

## 2013-01-25 DIAGNOSIS — I252 Old myocardial infarction: Secondary | ICD-10-CM | POA: Diagnosis not present

## 2013-01-25 DIAGNOSIS — E669 Obesity, unspecified: Secondary | ICD-10-CM | POA: Diagnosis present

## 2013-01-25 DIAGNOSIS — Z794 Long term (current) use of insulin: Secondary | ICD-10-CM | POA: Diagnosis not present

## 2013-01-25 DIAGNOSIS — I442 Atrioventricular block, complete: Secondary | ICD-10-CM | POA: Diagnosis not present

## 2013-01-25 DIAGNOSIS — G609 Hereditary and idiopathic neuropathy, unspecified: Secondary | ICD-10-CM | POA: Diagnosis present

## 2013-01-25 DIAGNOSIS — R0602 Shortness of breath: Secondary | ICD-10-CM | POA: Diagnosis not present

## 2013-01-25 DIAGNOSIS — E782 Mixed hyperlipidemia: Secondary | ICD-10-CM | POA: Diagnosis present

## 2013-01-25 DIAGNOSIS — I2789 Other specified pulmonary heart diseases: Secondary | ICD-10-CM | POA: Diagnosis present

## 2013-01-25 DIAGNOSIS — Z880 Allergy status to penicillin: Secondary | ICD-10-CM

## 2013-01-25 DIAGNOSIS — G4730 Sleep apnea, unspecified: Secondary | ICD-10-CM | POA: Diagnosis present

## 2013-01-25 DIAGNOSIS — E785 Hyperlipidemia, unspecified: Secondary | ICD-10-CM | POA: Diagnosis present

## 2013-01-25 DIAGNOSIS — Z8542 Personal history of malignant neoplasm of other parts of uterus: Secondary | ICD-10-CM

## 2013-01-25 DIAGNOSIS — I129 Hypertensive chronic kidney disease with stage 1 through stage 4 chronic kidney disease, or unspecified chronic kidney disease: Secondary | ICD-10-CM | POA: Diagnosis present

## 2013-01-25 DIAGNOSIS — Z9861 Coronary angioplasty status: Secondary | ICD-10-CM | POA: Diagnosis not present

## 2013-01-25 DIAGNOSIS — M1990 Unspecified osteoarthritis, unspecified site: Secondary | ICD-10-CM | POA: Diagnosis present

## 2013-01-25 DIAGNOSIS — I10 Essential (primary) hypertension: Secondary | ICD-10-CM | POA: Diagnosis present

## 2013-01-25 DIAGNOSIS — J4489 Other specified chronic obstructive pulmonary disease: Secondary | ICD-10-CM | POA: Diagnosis present

## 2013-01-25 DIAGNOSIS — Z7982 Long term (current) use of aspirin: Secondary | ICD-10-CM | POA: Diagnosis not present

## 2013-01-25 DIAGNOSIS — G4733 Obstructive sleep apnea (adult) (pediatric): Secondary | ICD-10-CM | POA: Diagnosis present

## 2013-01-25 DIAGNOSIS — R42 Dizziness and giddiness: Secondary | ICD-10-CM | POA: Diagnosis present

## 2013-01-25 HISTORY — DX: Heart failure, unspecified: I50.9

## 2013-01-25 LAB — BASIC METABOLIC PANEL
BUN: 38 mg/dL — ABNORMAL HIGH (ref 6–23)
Chloride: 100 mEq/L (ref 96–112)
Creatinine, Ser: 2.69 mg/dL — ABNORMAL HIGH (ref 0.50–1.10)
GFR calc Af Amer: 19 mL/min — ABNORMAL LOW (ref >90)
Glucose, Bld: 227 mg/dL — ABNORMAL HIGH (ref 70–99)
Potassium: 5 mEq/L (ref 3.5–5.1)

## 2013-01-25 LAB — CBC
HCT: 36.2 % (ref 36.0–46.0)
Hemoglobin: 11.4 g/dL — ABNORMAL LOW (ref 12.0–15.0)
MCHC: 31.5 g/dL (ref 30.0–36.0)
MCV: 76.2 fL — ABNORMAL LOW (ref 78.0–100.0)
RDW: 16.5 % — ABNORMAL HIGH (ref 11.5–15.5)
WBC: 8 10*3/uL (ref 4.0–10.5)

## 2013-01-25 MED ORDER — SODIUM CHLORIDE 0.9 % IV SOLN
INTRAVENOUS | Status: DC
  Administered 2013-01-25: via INTRAVENOUS

## 2013-01-25 MED ORDER — NITROGLYCERIN 0.4 MG SL SUBL
SUBLINGUAL_TABLET | SUBLINGUAL | Status: AC
  Administered 2013-01-25: 0.4 mg
  Filled 2013-01-25: qty 25

## 2013-01-25 MED ORDER — ASPIRIN 325 MG PO TABS
325.0000 mg | ORAL_TABLET | ORAL | Status: AC
  Administered 2013-01-25: 325 mg via ORAL
  Filled 2013-01-25: qty 1

## 2013-01-25 MED ORDER — NITROGLYCERIN 0.4 MG SL SUBL
0.4000 mg | SUBLINGUAL_TABLET | SUBLINGUAL | Status: DC | PRN
  Administered 2013-01-25 (×2): 0.4 mg via SUBLINGUAL

## 2013-01-25 NOTE — ED Notes (Addendum)
Pt rating chest "tightness" a 3 now but states she is very dizzy.

## 2013-01-25 NOTE — ED Provider Notes (Addendum)
History    This chart was scribed for Shelda Jakes, MD by Sofie Rower, ED Scribe. The patient was seen in room APA15/AP[facility]15 and the patient's care was started at 8:21PM.    CSN: [id number]  Arrival date & time 01/25/13  1948   First MD Initiated Contact with Patient 01/25/13 2021      Chief Complaint  Patient presents with  . Shortness of Breath  . Chest Pain    (Consider location/radiation/quality/duration/timing/severity/associated sxs/prior treatment) Patient is a 77 y.o. female presenting with chest pain. The history is provided by the patient. No language interpreter was used.  Chest Pain Pain location:  Substernal area (Left substernal area) Pain radiates to:  Does not radiate Pain radiates to the back: no   Pain severity:  Moderate Onset quality:  Sudden Duration:  3 minutes Timing:  Intermittent Progression:  Worsening Chronicity:  New Context: movement   Relieved by:  Nothing Worsened by:  Movement, exertion and certain positions Ineffective treatments:  Aspirin Associated symptoms: diaphoresis, dizziness, headache, nausea and shortness of breath   Associated symptoms: no abdominal pain, no back pain, no cough, no fever and not vomiting     Krystal [name] is a 77 y.o. female , with a hx of diabetes, CAD, hypertension, MI, and coronary angioplasty (performed 02/2008, 5 stents total), who presents to the Emergency Department complaining of intermittent, progressively worsening, non radiating chest pain, located at the left substernal chest, onset two days ago (01/23/13).  Associated symptoms include diffuse headache, dizziness (onset four days ago, characterized as a tight sensation, intensified by the transition from the seated to standing position), shortness of breath, nausea and sweats. The pt reports she has been very anxious and experiencing intermittent chest pains, lasting 2-3 minutes in duration, for the past two days. The last chest pain episode the pt  experienced occurred at 8:00PM this evening, 01/25/13. The pt has taken aspirin PTA which does not provide relief of the chest pain. Modifying factors include certain movements and positions which intensifies the dizziness, in addition to exertion which intensifies the shortness of breath. Important to note, the pt is taking plavix at the present point and time.   The pt denies fever, chills, nasal congestion, rhinorrhea, cough, sore throat, visual disturbance, neck pain, back pain, abdominal pain, vomiting, diarrhea, dysuria, and rash.  The pt does not smoke or drink alcohol.   PCP is Dr. Loleta Chance. Cardiologist is Dr. Tresa Endo [facility])   Past Medical History  Diagnosis Date  . Sleep apnea   . Occult blood in stools   . Diastolic dysfunction   . Myalgia   . Abnormal PFT   . DM (diabetes mellitus)     type II uncontrolled  . Palpitations   . Microalbuminuria   . Microcytic anemia   . DJD (degenerative joint disease)   . Constipation   . Abnormal heart rhythm   . CAD (coronary artery disease)     RCA stent in 1995. In Feb 2007 she had RCA ISR and a new RCA site Rx'd with Cypher stents. In Jan 2008 she had an LAD Cypher placed; Cath 09/12/12 - patent stents: 1 mLAD, 1 Cx, 3 in RCA  . Osteoarthritis   . Low back pain   . HTN (hypertension)   . Hyperlipidemia   . GERD (gastroesophageal reflux disease)   . History of colonic polyps   . Allergic rhinitis   . Personal history of colonic polyps   . HOH (hard of hearing)   .  MI (myocardial infarction) 98  . Glaucoma(365)   . Cancer     uterine  . Diastolic dysfunction, left ventricle 09/12/12  . Acute renal failure due to procedure 09/18/2012  . CKD (chronic kidney disease) stage 3, GFR 30-59 ml/min 09/18/2012    Past Surgical History  Procedure Laterality Date  . Ectopic pregnancy surgery    . Foot surgery      left and right for callous  . Angiplasty  99/05/06/07    x 5  . Cervical discectomy      L5  left/hemilminectomy  . Colonoscopy  09/2006    int hemmorhoids, COMPLICATED BY CARDIOPULMONARY COMPLICATIONS  . Coronary angioplasty  02/2008    5 cardiac stents total  . Abdominal hysterectomy      uterine cancer  . Polypectomy  10/22/2011    internal hemorrhoids/sessile polyp    Family History  Problem Relation Age of Onset  . Anesthesia problems Neg Hx   . Hypotension Neg Hx   . Malignant hyperthermia Neg Hx   . Pseudochol deficiency Neg Hx     History  Substance Use Topics  . Smoking status: Former Smoker -- 0.40 packs/day    Types: Cigarettes    Quit date: 10/18/2006  . Smokeless tobacco: Not on file     Comment: quit about 4 yrs ago  . Alcohol Use: No    OB History   Grav Para Term Preterm Abortions TAB SAB Ect Mult Living                  Review of Systems  Constitutional: Positive for diaphoresis. Negative for fever and chills.  HENT: Negative for congestion, sore throat, rhinorrhea and neck pain.   Eyes: Negative for visual disturbance.  Respiratory: Positive for shortness of breath. Negative for cough.   Cardiovascular: Positive for chest pain.  Gastrointestinal: Positive for nausea. Negative for vomiting, abdominal pain and diarrhea.  Genitourinary: Negative for dysuria.  Musculoskeletal: Negative for back pain.  Skin: Negative for rash.  Neurological: Positive for dizziness and headaches.  Hematological: Bruises/bleeds easily.    Allergies  Morphine and Penicillins  Home Medications   Current Outpatient Rx  Name  Route  Sig  Dispense  Refill  . amLODipine (NORVASC) 5 MG tablet   Oral   Take 5 mg by mouth every morning.         Marland Kitchen aspirin 81 MG EC tablet   Oral   Take 81 mg by mouth every morning.          . budesonide-formoterol (SYMBICORT) 160-4.5 MCG/ACT inhaler   Inhalation   Inhale 2 puffs into the lungs 2 (two) times daily.         . clopidogrel (PLAVIX) 75 MG tablet   Oral   Take 75 mg by mouth every morning.          .  ezetimibe (ZETIA) 10 MG tablet   Oral   Take 10 mg by mouth every morning.          . famotidine (PEPCID) 20 MG tablet   Oral   Take 20 mg by mouth 2 (two) times daily.          . ferrous sulfate 325 (65 FE) MG tablet   Oral   Take 325 mg by mouth daily.          . Fluticasone-Salmeterol (ADVAIR DISKUS) 250-50 MCG/DOSE AEPB   Inhalation   Inhale 1 puff into the lungs 2 (two) times daily.          Marland Kitchen  furosemide (LASIX) 40 MG tablet   Oral   Take 1 tablet (40 mg total) by mouth as needed (take one if your weight increases more than 2 lbs in 24hrs).   30 tablet      . insulin lispro protamine-insulin lispro (HUMALOG 75/25) (75-25) 100 UNIT/ML SUSP   Subcutaneous   Inject 40-70 Units into the skin 2 (two) times daily with a meal. 70 units in the morning and 40 units at bedtime         . isosorbide mononitrate (IMDUR) 60 MG 24 hr tablet   Oral   Take 30 mg by mouth daily.          Marland Kitchen loratadine (CLARITIN) 10 MG tablet   Oral   Take 10 mg by mouth every morning.          . nebivolol (BYSTOLIC) 10 MG tablet   Oral   Take 10 mg by mouth daily. *takes with 20mg  tablet for a total of 30mg  daily*         . Nebivolol HCl (BYSTOLIC) 20 MG TABS   Oral   Take 20 mg by mouth daily. Takes with 10mg  tablet for total dose of 30mg          . NITROSTAT 0.4 MG SL tablet   Oral   Take 1 tablet by mouth every 5 (five) minutes x 3 doses as needed. For chest pains         . olmesartan (BENICAR) 40 MG tablet   Oral   Take 40 mg by mouth every morning.         . ranolazine (RANEXA) 500 MG 12 hr tablet   Oral   Take 1 tablet (500 mg total) by mouth 2 (two) times daily.   60 tablet   6   . rosuvastatin (CRESTOR) 40 MG tablet   Oral   Take 40 mg by mouth every evening.          . verapamil (CALAN-SR) 120 MG CR tablet   Oral   Take 120 mg by mouth at bedtime.           BP 134/39  Pulse 101  Temp(Src) 97.5 F (36.4 C) (Oral)  Resp 20  Ht 5\' 7"  (1.702 m)   Wt 219 lb (99.338 kg)  BMI 34.29 kg/m2  SpO2 95%  Physical Exam  Nursing note and vitals reviewed. Constitutional: She is oriented to person, place, and time. She appears well-developed and well-nourished. No distress.  HENT:  Head: Normocephalic and atraumatic.  Mouth/Throat: Oropharynx is clear and moist.  Eyes: EOM are normal. Pupils are equal, round, and reactive to light. No scleral icterus.  Neck: Neck supple. No tracheal deviation present.  Cardiovascular: Regular rhythm and normal heart sounds.  Bradycardia present.   No murmur heard. Pulmonary/Chest: Effort normal and breath sounds normal. No respiratory distress. She has no wheezes.  Abdominal: Soft. Bowel sounds are normal. She exhibits no distension. There is no tenderness.  Musculoskeletal: Normal range of motion. She exhibits no edema.  Neurological: She is alert and oriented to person, place, and time. No cranial nerve deficit or sensory deficit.  Skin: Skin is warm and dry.  Psychiatric: She has a normal mood and affect. Her behavior is normal.    ED Course  Procedures (including critical care time)  DIAGNOSTIC STUDIES: Oxygen Saturation is 95% on [place], normal by my interpretation.    COORDINATION OF CARE:  9:17 PM- Treatment plan discussed with patient. Pt agrees with treatment.  Results for orders placed during the hospital encounter of 01/25/13  CBC      Result Value Range   WBC 8.0  4.0 - 10.5 K/uL   RBC 4.75  3.87 - 5.11 MIL/uL   Hemoglobin 11.4 (*) 12.0 - 15.0 g/dL   HCT 11.9  14.7 - 82.9 %   MCV 76.2 (*) 78.0 - 100.0 fL   MCH 24.0 (*) 26.0 - 34.0 pg   MCHC 31.5  30.0 - 36.0 g/dL   RDW 56.2 (*) 13.0 - 86.5 %   Platelets 230  150 - 400 K/uL  BASIC METABOLIC PANEL      Result Value Range   Sodium 139  135 - 145 mEq/L   Potassium 5.0  3.5 - 5.1 mEq/L   Chloride 100  96 - 112 mEq/L   CO2 31  19 - 32 mEq/L   Glucose, Bld 227 (*) 70 - 99 mg/dL   BUN 38 (*) 6 - 23 mg/dL   Creatinine, Ser 7.84 (*)  0.50 - 1.10 mg/dL   Calcium 9.6  8.4 - 69.6 mg/dL   GFR calc non Af Amer 16 (*) >90 mL/min   GFR calc Af Amer 19 (*) >90 mL/min  TROPONIN I      Result Value Range   Troponin I <0.30  <0.30 ng/mL  PRO B NATRIURETIC PEPTIDE      Result Value Range   Pro B Natriuretic peptide (BNP) 613.4 (*) 0 - 125 pg/mL  HEPATIC FUNCTION PANEL      Result Value Range   Total Protein 6.5  6.0 - 8.3 g/dL   Albumin 3.3 (*) 3.5 - 5.2 g/dL   AST 14  0 - 37 U/L   ALT 17  0 - 35 U/L   Alkaline Phosphatase 65  39 - 117 U/L   Total Bilirubin 0.2 (*) 0.3 - 1.2 mg/dL   Bilirubin, Direct PENDING  0.0 - 0.3 mg/dL   Indirect Bilirubin PENDING  0.3 - 0.9 mg/dL   Dg Chest Port 1 View  01/25/2013  *RADIOLOGY REPORT*  Clinical Data: Shortness of breath and chest pain.  PORTABLE CHEST - 1 VIEW  Comparison: Chest 11/29/2011.  Findings: Heart size is upper normal.  No pulmonary edema, consolidative process or pneumothorax.  No pleural effusion is identified.  IMPRESSION: No acute finding.   Original Report Authenticated By: Holley Dexter, M.D.    Results for orders placed during the hospital encounter of 01/25/13  CBC      Result Value Range   WBC 8.0  4.0 - 10.5 K/uL   RBC 4.75  3.87 - 5.11 MIL/uL   Hemoglobin 11.4 (*) 12.0 - 15.0 g/dL   HCT 29.5  28.4 - 13.2 %   MCV 76.2 (*) 78.0 - 100.0 fL   MCH 24.0 (*) 26.0 - 34.0 pg   MCHC 31.5  30.0 - 36.0 g/dL   RDW 44.0 (*) 10.2 - 72.5 %   Platelets 230  150 - 400 K/uL  BASIC METABOLIC PANEL      Result Value Range   Sodium 139  135 - 145 mEq/L   Potassium 5.0  3.5 - 5.1 mEq/L   Chloride 100  96 - 112 mEq/L   CO2 31  19 - 32 mEq/L   Glucose, Bld 227 (*) 70 - 99 mg/dL   BUN 38 (*) 6 - 23 mg/dL   Creatinine, Ser 3.66 (*) 0.50 - 1.10 mg/dL   Calcium 9.6  8.4 - 44.0 mg/dL  GFR calc non Af Amer 16 (*) >90 mL/min   GFR calc Af Amer 19 (*) >90 mL/min  TROPONIN I      Result Value Range   Troponin I <0.30  <0.30 ng/mL  PRO B NATRIURETIC PEPTIDE      Result Value  Range   Pro B Natriuretic peptide (BNP) 613.4 (*) 0 - 125 pg/mL  HEPATIC FUNCTION PANEL      Result Value Range   Total Protein 6.5  6.0 - 8.3 g/dL   Albumin 3.3 (*) 3.5 - 5.2 g/dL   AST 14  0 - 37 U/L   ALT 17  0 - 35 U/L   Alkaline Phosphatase 65  39 - 117 U/L   Total Bilirubin 0.2 (*) 0.3 - 1.2 mg/dL   Bilirubin, Direct PENDING  0.0 - 0.3 mg/dL   Indirect Bilirubin PENDING  0.3 - 0.9 mg/dL     Date: 96/02/5408  Rate: 47  Rhythm: Complete heart block.  QRS Axis: normal  Intervals: normal  ST/T Wave abnormalities: normal  Conduction Disutrbances:none  Narrative Interpretation:   Old EKG Reviewed: changes noted Previous EKG from 02/11/2013 shows marked sinus bradycardia with first degree AV block.   1. Complete heart block   2. Chest pain       MDM  Patient with new complete heart block. Previous EKG did show a sinus bradycardia with first degree AV block. Patient's followed by [facility] heart and vascular cardiology. In discussion with the on-call physician for the group house possible suspects include that the patient is on a beta blocker and a calcium channel blocker however she been on both of them for a long time that could be the culprit. They will admit her to the CC[facility] by [facility]. If heart block does not resolve may require pacemaker. Patient's symptoms today of feeling as if she was given a pass out or probably related to this. While patient is in the bed unknown further symptoms. Heart rates been consistently in the mid to upper 40s occasionally in the low 50s. Blood pressure although not hypotensive has been in the 90s systolic. Patient's troponin was negative she's had intermittent chest pain for the past 3 days. Renal function shows an elevated creatinine at 2.69 with a BUN of 38 potassium normal at 5. Patient's previous creatinines have been in the 1 range services as an increase. As stated above patient's chest pain is only lasted 1-2 minutes  and then is gone for about an hour for comes back again. She did have an aspirin today was originally treated with some nitroglycerin when she got here made no difference in the pain. Chest x-ray was negative.     I personally performed the services described in this documentation, which was scribed in my presence. The recorded information has been reviewed and is accurate.     Shelda Jakes, MD 01/25/13 [id number]  Shelda Jakes, MD 01/26/13 0001

## 2013-01-25 NOTE — ED Notes (Signed)
Short of breath, chest pain, and I have been dizzy for 3 days per pt.

## 2013-01-26 ENCOUNTER — Encounter (HOSPITAL_COMMUNITY): Payer: Self-pay | Admitting: *Deleted

## 2013-01-26 DIAGNOSIS — E119 Type 2 diabetes mellitus without complications: Secondary | ICD-10-CM | POA: Diagnosis not present

## 2013-01-26 DIAGNOSIS — I442 Atrioventricular block, complete: Secondary | ICD-10-CM | POA: Diagnosis present

## 2013-01-26 DIAGNOSIS — I129 Hypertensive chronic kidney disease with stage 1 through stage 4 chronic kidney disease, or unspecified chronic kidney disease: Secondary | ICD-10-CM | POA: Diagnosis not present

## 2013-01-26 LAB — HEPATIC FUNCTION PANEL
ALT: 17 U/L (ref 0–35)
Albumin: 3.3 g/dL — ABNORMAL LOW (ref 3.5–5.2)
Alkaline Phosphatase: 65 U/L (ref 39–117)
Total Bilirubin: 0.2 mg/dL — ABNORMAL LOW (ref 0.3–1.2)
Total Protein: 6.5 g/dL (ref 6.0–8.3)

## 2013-01-26 LAB — BASIC METABOLIC PANEL
BUN: 41 mg/dL — ABNORMAL HIGH (ref 6–23)
CO2: 30 mEq/L (ref 19–32)
Calcium: 9 mg/dL (ref 8.4–10.5)
Chloride: 102 mEq/L (ref 96–112)
Creatinine, Ser: 2.35 mg/dL — ABNORMAL HIGH (ref 0.50–1.10)
GFR calc Af Amer: 22 mL/min — ABNORMAL LOW (ref >90)
GFR calc non Af Amer: 19 mL/min — ABNORMAL LOW (ref >90)
Glucose, Bld: 233 mg/dL — ABNORMAL HIGH (ref 70–99)
Potassium: 4.3 mEq/L (ref 3.5–5.1)
Sodium: 139 mEq/L (ref 135–145)

## 2013-01-26 LAB — URINALYSIS, ROUTINE W REFLEX MICROSCOPIC
Bilirubin Urine: NEGATIVE
Glucose, UA: NEGATIVE mg/dL
Hgb urine dipstick: NEGATIVE
Ketones, ur: NEGATIVE mg/dL
Nitrite: NEGATIVE
Protein, ur: 30 mg/dL — AB
Specific Gravity, Urine: 1.019 (ref 1.005–1.030)
Urobilinogen, UA: 0.2 mg/dL (ref 0.0–1.0)
pH: 5 (ref 5.0–8.0)

## 2013-01-26 LAB — CBC
HCT: 33.9 % — ABNORMAL LOW (ref 36.0–46.0)
Hemoglobin: 11 g/dL — ABNORMAL LOW (ref 12.0–15.0)
MCH: 24.2 pg — ABNORMAL LOW (ref 26.0–34.0)
MCHC: 32.4 g/dL (ref 30.0–36.0)
MCV: 74.5 fL — ABNORMAL LOW (ref 78.0–100.0)
Platelets: 203 10*3/uL (ref 150–400)
RBC: 4.55 MIL/uL (ref 3.87–5.11)
RDW: 16.2 % — ABNORMAL HIGH (ref 11.5–15.5)
WBC: 7.7 10*3/uL (ref 4.0–10.5)

## 2013-01-26 LAB — URINE MICROSCOPIC-ADD ON

## 2013-01-26 LAB — MRSA PCR SCREENING: MRSA by PCR: NEGATIVE

## 2013-01-26 LAB — GLUCOSE, CAPILLARY
Glucose-Capillary: 248 mg/dL — ABNORMAL HIGH (ref 70–99)
Glucose-Capillary: 205 mg/dL — ABNORMAL HIGH (ref 70–99)
Glucose-Capillary: 277 mg/dL — ABNORMAL HIGH (ref 70–99)
Glucose-Capillary: 156 mg/dL — ABNORMAL HIGH (ref 70–99)

## 2013-01-26 LAB — TSH: TSH: 1.021 u[IU]/mL (ref 0.350–4.500)

## 2013-01-26 LAB — PRO B NATRIURETIC PEPTIDE: Pro B Natriuretic peptide (BNP): 828 pg/mL — ABNORMAL HIGH (ref 0–125)

## 2013-01-26 LAB — HEMOGLOBIN A1C
Hgb A1c MFr Bld: 10.1 % — ABNORMAL HIGH (ref <5.7)
Mean Plasma Glucose: 243 mg/dL — ABNORMAL HIGH (ref <117)

## 2013-01-26 LAB — TROPONIN I: Troponin I: <0.3 ng/mL (ref <0.30)

## 2013-01-26 MED ORDER — RANOLAZINE ER 500 MG PO TB12
500.0000 mg | ORAL_TABLET | Freq: Two times a day (BID) | ORAL | Status: DC
  Administered 2013-01-26 – 2013-01-27 (×3): 500 mg via ORAL
  Filled 2013-01-26 (×4): qty 1

## 2013-01-26 MED ORDER — FAMOTIDINE 20 MG PO TABS
20.0000 mg | ORAL_TABLET | Freq: Every day | ORAL | Status: DC
  Administered 2013-01-26 – 2013-01-27 (×2): 20 mg via ORAL
  Filled 2013-01-26 (×2): qty 1

## 2013-01-26 MED ORDER — BIOTENE DRY MOUTH MT LIQD
15.0000 mL | Freq: Two times a day (BID) | OROMUCOSAL | Status: DC
  Administered 2013-01-26 – 2013-01-27 (×3): 15 mL via OROMUCOSAL

## 2013-01-26 MED ORDER — ASPIRIN EC 81 MG PO TBEC
81.0000 mg | DELAYED_RELEASE_TABLET | Freq: Every day | ORAL | Status: DC
  Administered 2013-01-26 – 2013-01-27 (×2): 81 mg via ORAL
  Filled 2013-01-26 (×2): qty 1

## 2013-01-26 MED ORDER — ATORVASTATIN CALCIUM 80 MG PO TABS
80.0000 mg | ORAL_TABLET | Freq: Every day | ORAL | Status: DC
  Administered 2013-01-26: 80 mg via ORAL
  Filled 2013-01-26 (×2): qty 1

## 2013-01-26 MED ORDER — MAGNESIUM HYDROXIDE 400 MG/5ML PO SUSP
30.0000 mL | Freq: Every day | ORAL | Status: DC | PRN
  Administered 2013-01-26: 30 mL via ORAL
  Filled 2013-01-26: qty 30

## 2013-01-26 MED ORDER — SODIUM CHLORIDE 0.9 % IV SOLN: 250.0000 mL | INTRAVENOUS | Status: DC | PRN

## 2013-01-26 MED ORDER — INSULIN LISPRO PROT & LISPRO (75-25 MIX) 100 UNIT/ML ~~LOC~~ SUSP: 30.0000 [IU] | Freq: Two times a day (BID) | SUBCUTANEOUS | Status: DC

## 2013-01-26 MED ORDER — EZETIMIBE 10 MG PO TABS
10.0000 mg | ORAL_TABLET | Freq: Every morning | ORAL | Status: DC
  Administered 2013-01-26 – 2013-01-27 (×2): 10 mg via ORAL
  Filled 2013-01-26 (×2): qty 1

## 2013-01-26 MED ORDER — CLOPIDOGREL BISULFATE 75 MG PO TABS
75.0000 mg | ORAL_TABLET | Freq: Every day | ORAL | Status: DC
  Administered 2013-01-26 – 2013-01-27 (×2): 75 mg via ORAL
  Filled 2013-01-26 (×3): qty 1

## 2013-01-26 MED ORDER — ENOXAPARIN SODIUM 30 MG/0.3ML ~~LOC~~ SOLN
30.0000 mg | SUBCUTANEOUS | Status: DC
  Administered 2013-01-26 – 2013-01-27 (×2): 30 mg via SUBCUTANEOUS
  Filled 2013-01-26 (×2): qty 0.3

## 2013-01-26 MED ORDER — LORATADINE 10 MG PO TABS
10.0000 mg | ORAL_TABLET | Freq: Every morning | ORAL | Status: DC
  Administered 2013-01-26 – 2013-01-27 (×2): 10 mg via ORAL
  Filled 2013-01-26 (×2): qty 1

## 2013-01-26 MED ORDER — MOMETASONE FURO-FORMOTEROL FUM 100-5 MCG/ACT IN AERO: 2.0000 | INHALATION_SPRAY | Freq: Two times a day (BID) | RESPIRATORY_TRACT | Status: DC

## 2013-01-26 MED ORDER — FERROUS SULFATE 325 (65 FE) MG PO TABS
325.0000 mg | ORAL_TABLET | Freq: Every day | ORAL | Status: DC
  Administered 2013-01-26 – 2013-01-27 (×2): 325 mg via ORAL
  Filled 2013-01-26 (×3): qty 1

## 2013-01-26 MED ORDER — INSULIN ASPART PROT & ASPART (70-30 MIX) 100 UNIT/ML ~~LOC~~ SUSP
30.0000 [IU] | Freq: Two times a day (BID) | SUBCUTANEOUS | Status: DC
  Administered 2013-01-26 (×2): 30 [IU] via SUBCUTANEOUS
  Filled 2013-01-26 (×2): qty 10

## 2013-01-26 MED ORDER — ONDANSETRON HCL 4 MG/2ML IJ SOLN: 4.0000 mg | Freq: Four times a day (QID) | INTRAMUSCULAR | Status: DC | PRN

## 2013-01-26 MED ORDER — FAMOTIDINE 20 MG PO TABS: 20.0000 mg | ORAL_TABLET | Freq: Two times a day (BID) | ORAL | Status: DC

## 2013-01-26 MED ORDER — ZOLPIDEM TARTRATE 5 MG PO TABS: 5.0000 mg | ORAL_TABLET | Freq: Every evening | ORAL | Status: DC | PRN

## 2013-01-26 MED ORDER — DOPAMINE-DEXTROSE 3.2-5 MG/ML-% IV SOLN
INTRAVENOUS | Status: AC
  Filled 2013-01-26: qty 250

## 2013-01-26 MED ORDER — ALPRAZOLAM 0.25 MG PO TABS: 0.2500 mg | ORAL_TABLET | Freq: Two times a day (BID) | ORAL | Status: DC | PRN

## 2013-01-26 MED ORDER — ISOSORBIDE MONONITRATE ER 30 MG PO TB24
30.0000 mg | ORAL_TABLET | Freq: Every day | ORAL | Status: DC
  Administered 2013-01-26 – 2013-01-27 (×2): 30 mg via ORAL
  Filled 2013-01-26 (×2): qty 1

## 2013-01-26 MED ORDER — ASPIRIN 81 MG PO TBEC
81.0000 mg | DELAYED_RELEASE_TABLET | Freq: Every morning | ORAL | Status: DC
Start: 2013-01-26 — End: 2013-01-26

## 2013-01-26 MED ORDER — SODIUM CHLORIDE 0.9 % IV SOLN
INTRAVENOUS | Status: DC
  Administered 2013-01-26: 10 mL/h via INTRAVENOUS

## 2013-01-26 MED ORDER — AMLODIPINE BESYLATE 5 MG PO TABS
5.0000 mg | ORAL_TABLET | Freq: Every morning | ORAL | Status: DC
  Administered 2013-01-26 – 2013-01-27 (×2): 5 mg via ORAL
  Filled 2013-01-26 (×2): qty 1

## 2013-01-26 MED ORDER — SODIUM CHLORIDE 0.9 % IJ SOLN: 3.0000 mL | INTRAMUSCULAR | Status: DC | PRN

## 2013-01-26 MED ORDER — ACETAMINOPHEN 325 MG PO TABS: 650.0000 mg | ORAL_TABLET | ORAL | Status: DC | PRN

## 2013-01-26 MED ORDER — BUDESONIDE-FORMOTEROL FUMARATE 160-4.5 MCG/ACT IN AERO
2.0000 | INHALATION_SPRAY | Freq: Two times a day (BID) | RESPIRATORY_TRACT | Status: DC
  Administered 2013-01-26 – 2013-01-27 (×3): 2 via RESPIRATORY_TRACT
  Filled 2013-01-26: qty 6

## 2013-01-26 MED ORDER — SODIUM CHLORIDE 0.9 % IJ SOLN
3.0000 mL | Freq: Two times a day (BID) | INTRAMUSCULAR | Status: DC
  Administered 2013-01-26 – 2013-01-27 (×2): 3 mL via INTRAVENOUS

## 2013-01-26 MED ORDER — NITROGLYCERIN 0.4 MG SL SUBL: 0.4000 mg | SUBLINGUAL_TABLET | SUBLINGUAL | Status: DC | PRN

## 2013-01-26 MED ORDER — DOPAMINE-DEXTROSE 3.2-5 MG/ML-% IV SOLN
3.0000 ug/kg/min | INTRAVENOUS | Status: DC
  Administered 2013-01-26: 3 ug/kg/min via INTRAVENOUS

## 2013-01-26 NOTE — H&P (Signed)
Patient ID: HAILE TOPPINS MRN: [id number], DOB/AGE: 1936/03/25   Admit date: 01/25/2013   Primary Physician: Evlyn Courier, MD Primary Cardiologist: Dr Tresa Endo  HPI:     77 y/o female known to Dr Tresa Endo with a history of CAD. She has had a remote RCA stent in 1995. In Feb 2007 she had RCA ISR and a new RCA site Rx'd with Cypher stents. In Jan 2008 she had an LAD Cypher placed. Cath in April 2009, December 2010, and May 2012 have shown patency of these sites. The last cath was 10/13 after an abnormal Myoview. This again showed patent stents and she was treated medically. She saw Dr Tresa Endo 12/24/12 and was complaining of some chest pain. A Myoview was done 01/14/13 that was low risk. She came to the ER 6:30 pm on the 9th with complaints of 3 days of weakness and dizziness. She denies syncope. In the ER her EKG showed heart block vs junctional rhythm with narrow complex QRS and bradycardia. She is transported now to [facility]. She is currently in NSR SB and comfortable. She denies any syncope.     Problem List: Past Medical History  Diagnosis Date  . Sleep apnea   . Occult blood in stools   . Diastolic dysfunction   . Myalgia   . Abnormal PFT   . DM (diabetes mellitus)     type II uncontrolled  . Palpitations   . Microalbuminuria   . Microcytic anemia   . DJD (degenerative joint disease)   . Constipation   . Abnormal heart rhythm   . CAD (coronary artery disease)     RCA stent in 1995. In Feb 2007 she had RCA ISR and a new RCA site Rx'd with Cypher stents. In Jan 2008 she had an LAD Cypher placed; Cath 09/12/12 - patent stents: 1 mLAD, 1 Cx, 3 in RCA  . Osteoarthritis   . Low back pain   . HTN (hypertension)   . Hyperlipidemia   . GERD (gastroesophageal reflux disease)   . History of colonic polyps   . Allergic rhinitis   . Personal history of colonic polyps   . HOH (hard of hearing)   . MI (myocardial infarction) 98  . Glaucoma(365)   . Cancer     uterine  . Diastolic dysfunction, left  ventricle 09/12/12  . Acute renal failure due to procedure 09/18/2012  . CKD (chronic kidney disease) stage 3, GFR 30-59 ml/min 09/18/2012  . CHF (congestive heart failure)     Past Surgical History  Procedure Laterality Date  . Ectopic pregnancy surgery    . Foot surgery      left and right for callous  . Angiplasty  99/05/06/07    x 5  . Cervical discectomy      L5 left/hemilminectomy  . Colonoscopy  09/2006    int hemmorhoids, COMPLICATED BY CARDIOPULMONARY COMPLICATIONS  . Coronary angioplasty  02/2008    5 cardiac stents total  . Abdominal hysterectomy      uterine cancer  . Polypectomy  10/22/2011    internal hemorrhoids/sessile polyp     Allergies:  Allergies  Allergen Reactions  . Morphine Shortness Of Breath and Swelling  . Penicillins Shortness Of Breath and Swelling     Home Medications Prescriptions prior to admission  Medication Sig Dispense Refill  . amLODipine (NORVASC) 5 MG tablet Take 5 mg by mouth every morning.      Marland Kitchen aspirin 81 MG EC tablet Take 81 mg by mouth every  morning.       . budesonide-formoterol (SYMBICORT) 160-4.5 MCG/ACT inhaler Inhale 2 puffs into the lungs 2 (two) times daily.      . clopidogrel (PLAVIX) 75 MG tablet Take 75 mg by mouth every morning.       . ezetimibe (ZETIA) 10 MG tablet Take 10 mg by mouth every morning.       . famotidine (PEPCID) 20 MG tablet Take 20 mg by mouth 2 (two) times daily.       . ferrous sulfate 325 (65 FE) MG tablet Take 325 mg by mouth daily.       . Fluticasone-Salmeterol (ADVAIR DISKUS) 250-50 MCG/DOSE AEPB Inhale 1 puff into the lungs 2 (two) times daily.       . furosemide (LASIX) 40 MG tablet Take 1 tablet (40 mg total) by mouth as needed (take one if your weight increases more than 2 lbs in 24hrs).  30 tablet    . insulin lispro protamine-insulin lispro (HUMALOG 75/25) (75-25) 100 UNIT/ML SUSP Inject 40-70 Units into the skin 2 (two) times daily with a meal. 70 units in the morning and 40 units at  bedtime      . isosorbide mononitrate (IMDUR) 60 MG 24 hr tablet Take 30 mg by mouth daily.       Marland Kitchen loratadine (CLARITIN) 10 MG tablet Take 10 mg by mouth every morning.       . nebivolol (BYSTOLIC) 10 MG tablet Take 10 mg by mouth daily. *takes with 20mg  tablet for a total of 30mg  daily*      . Nebivolol HCl (BYSTOLIC) 20 MG TABS Take 20 mg by mouth daily. Takes with 10mg  tablet for total dose of 30mg       . NITROSTAT 0.4 MG SL tablet Take 1 tablet by mouth every 5 (five) minutes x 3 doses as needed. For chest pains      . olmesartan (BENICAR) 40 MG tablet Take 40 mg by mouth every morning.      . ranolazine (RANEXA) 500 MG 12 hr tablet Take 1 tablet (500 mg total) by mouth 2 (two) times daily.  60 tablet  6  . rosuvastatin (CRESTOR) 40 MG tablet Take 40 mg by mouth every evening.       . verapamil (CALAN-SR) 120 MG CR tablet Take 120 mg by mouth at bedtime.         Family History  Problem Relation Age of Onset  . Anesthesia problems Neg Hx   . Hypotension Neg Hx   . Malignant hyperthermia Neg Hx   . Pseudochol deficiency Neg Hx      History   Social History  . Marital Status: Widowed    Spouse Name: N/A    Number of Children: N/A  . Years of Education: N/A   Occupational History  . Not on file.   Social History Main Topics  . Smoking status: Former Smoker -- 0.40 packs/day    Types: Cigarettes    Quit date: 10/18/2006  . Smokeless tobacco: Not on file     Comment: quit about 4 yrs ago  . Alcohol Use: No  . Drug Use: No  . Sexually Active: Yes    Birth Control/ Protection: Surgical   Other Topics Concern  . Not on file   Social History Narrative  . No narrative on file     Review of Systems: General: negative for chills, fever, night sweats or weight changes.  Cardiovascular edema, orthopnea, palpitations, paroxysmal nocturnal dyspnea or  shortness of breath Dermatological: negative for rash Respiratory: negative for cough or wheezing, chronic O2 and  C-pap Urologic: negative for hematuria Abdominal: negative for nausea, vomiting, diarrhea, bright red blood per rectum, melena, or hematemesis Neurologic: negative for visual changes, syncope, or dizziness All other systems reviewed and are otherwise negative except as noted above.  Physical Exam: Blood pressure 114/67, pulse 47, temperature 97.5 F (36.4 C), temperature source Oral, resp. rate 18, height 5\' 7"  (1.702 m), weight 99.338 kg (219 lb), SpO2 98.00%.  General appearance: alert, cooperative, no distress and morbidly obese Neck: no carotid bruit and bruit Lungs: clear to auscultation bilaterally Heart: regular rate and rhythm Abdomen: obese Extremities: extremities normal, atraumatic, no cyanosis or edema Pulses: 2+ and symmetric Skin: Skin color, texture, turgor normal. No rashes or lesions Neurologic: Grossly normal    Labs:   Results for orders placed during the hospital encounter of 01/25/13 (from the past 24 hour(s))  CBC     Status: Abnormal   Collection Time    01/25/13  9:18 PM      Result Value Range   WBC 8.0  4.0 - 10.5 K/uL   RBC 4.75  3.87 - 5.11 MIL/uL   Hemoglobin 11.4 (*) 12.0 - 15.0 g/dL   HCT 16.1  09.6 - 04.5 %   MCV 76.2 (*) 78.0 - 100.0 fL   MCH 24.0 (*) 26.0 - 34.0 pg   MCHC 31.5  30.0 - 36.0 g/dL   RDW 40.9 (*) 81.1 - 91.4 %   Platelets 230  150 - 400 K/uL  BASIC METABOLIC PANEL     Status: Abnormal   Collection Time    01/25/13  9:18 PM      Result Value Range   Sodium 139  135 - 145 mEq/L   Potassium 5.0  3.5 - 5.1 mEq/L   Chloride 100  96 - 112 mEq/L   CO2 31  19 - 32 mEq/L   Glucose, Bld 227 (*) 70 - 99 mg/dL   BUN 38 (*) 6 - 23 mg/dL   Creatinine, Ser 7.82 (*) 0.50 - 1.10 mg/dL   Calcium 9.6  8.4 - 95.6 mg/dL   GFR calc non Af Amer 16 (*) >90 mL/min   GFR calc Af Amer 19 (*) >90 mL/min  TROPONIN I     Status: None   Collection Time    01/25/13  9:18 PM      Result Value Range   Troponin I <0.30  <0.30 ng/mL  PRO B  NATRIURETIC PEPTIDE     Status: Abnormal   Collection Time    01/25/13  9:18 PM      Result Value Range   Pro B Natriuretic peptide (BNP) 613.4 (*) 0 - 125 pg/mL  HEPATIC FUNCTION PANEL     Status: Abnormal (Preliminary result)   Collection Time    01/25/13  9:40 PM      Result Value Range   Total Protein 6.5  6.0 - 8.3 g/dL   Albumin 3.3 (*) 3.5 - 5.2 g/dL   AST 14  0 - 37 U/L   ALT 17  0 - 35 U/L   Alkaline Phosphatase 65  39 - 117 U/L   Total Bilirubin 0.2 (*) 0.3 - 1.2 mg/dL   Bilirubin, Direct PENDING  0.0 - 0.3 mg/dL   Indirect Bilirubin PENDING  0.3 - 0.9 mg/dL     Radiology/Studies: Dg Chest Port 1 View  01/25/2013  *RADIOLOGY REPORT*  Clinical Data: Shortness  of breath and chest pain.  PORTABLE CHEST - 1 VIEW  Comparison: Chest 11/29/2011.  Findings: Heart size is upper normal.  No pulmonary edema, consolidative process or pneumothorax.  No pleural effusion is identified.  IMPRESSION: No acute finding.   Original Report Authenticated By: Holley Dexter, M.D.     RUE:[id number] NSR, SB  ASSESSMENT AND PLAN:   Principal Problem:   Complete heart block, transient, vs transient junctional rythm  Active Problems:   DIZZINESS, secondary to arrythmia   Diabetes mellitus, type 2 IDDM   HYPERTENSION   CAD, prior LAD/RCA/CFX stents, cath 09/12/12 with patent stents, low risk BNuc 01/14/13   HYPERTENSION, PULMONARY - partially due to LV diastolic dysfunction   COPD, chronic O2   CKD (chronic kidney disease) stage 3, GFR 30-59 ml/min   HYPERLIPIDEMIA   OBESITY   PERIPHERAL NEUROPATHY   DIASTOLIC DYSFUNCTION, normal LVF   SLEEP APNEA, on C-pap  Plan- Hold Bystolic, (on 30mg  daily ?) low dose Dopamine tonight. Consider lower dose Bystolic on am. Stop Verapamil, hold ARB with borderline high K+ and increase in her baseline SCr.     Deland Pretty, PA-C 01/26/2013, 1:05 AM

## 2013-01-26 NOTE — Progress Notes (Signed)
Placed pt. On cpap as per order. Pt. Is tolerating well at this time. RT will monitor.

## 2013-01-26 NOTE — H&P (Signed)
Pt. Seen and examined. Agree with the NP/PA-C note as written.  77 yo female who has been feeling weak and dizzy. EKG on admission showed marked bradycardia and complete heart block.  Her medications were withheld and she was started on low dose dopamine. This morning she is in sinus rhythm in the 70's with a normal blood pressure. No complaints of chest pain. Still feels somewhat fatigued.  Plan to stop dopamine this morning. Continue to hold bystolic and verapamil.  May need to add hydralazine if BP >140 systolic. Ok to transfer to telemetry floor this afternoon.  Chrystie Nose, MD, [facility] Attending Cardiologist [facility]

## 2013-01-26 NOTE — Care Management Note (Signed)
    Page 1 of 1   01/26/2013     10:35:06 AM   CARE MANAGEMENT NOTE 01/26/2013  Patient:  Krystal [name], Krystal [name]   Account Number:  [id number]  Date Initiated:  01/26/2013  Documentation initiated by:  Junius Creamer  Subjective/Objective Assessment:   adm w arrthymia     Action/Plan:   lives alone, pcp dr Earvin [place]   Anticipated DC Date:     Anticipated DC Plan:        DC Planning Services  CM consult      Choice offered to / List presented to:             Status of service:   Medicare Important Message given?   (If response is "NO", the following Medicare IM given date fields will be blank) Date Medicare IM given:   Date Additional Medicare IM given:    Discharge Disposition:    Per UR Regulation:  Reviewed for med. necessity/level of care/duration of stay  If discussed at Long Length of Stay Meetings, dates discussed:    Comments:  3/10 1034a debbie dowell rn,bsn

## 2013-01-26 NOTE — Progress Notes (Signed)
Inpatient Diabetes Program Recommendations  AACE/ADA: New Consensus Statement on Inpatient Glycemic Control (2013)  Target Ranges:  Prepandial:   less than 140 mg/dL      Peak postprandial:   less than 180 mg/dL (1-2 hours)      Critically ill patients:  140 - 180 mg/dL  Results for BETHANN, QUALLEY (MRN [id number]) as of 01/26/2013 13:37  Ref. Range 01/26/2013 01:42 01/26/2013 08:22 01/26/2013 11:52  Glucose-Capillary Latest Range: 70-99 mg/dL 295 (H) 621 (H) 308 (H)   Inpatient Diabetes Program Recommendations Correction (SSI): Start Novolog MODERATE scale TID + HS scale per Glycemic Control order set Thank you  Piedad Climes BSN, RN,CDE Inpatient Diabetes Coordinator [phone] (team pager)

## 2013-01-27 DIAGNOSIS — I442 Atrioventricular block, complete: Secondary | ICD-10-CM | POA: Diagnosis not present

## 2013-01-27 DIAGNOSIS — I2510 Atherosclerotic heart disease of native coronary artery without angina pectoris: Secondary | ICD-10-CM | POA: Diagnosis not present

## 2013-01-27 LAB — GLUCOSE, CAPILLARY: Glucose-Capillary: 194 mg/dL — ABNORMAL HIGH (ref 70–99)

## 2013-01-27 MED ORDER — AMLODIPINE BESYLATE 5 MG PO TABS
5.0000 mg | ORAL_TABLET | Freq: Two times a day (BID) | ORAL | Status: DC
  Filled 2013-01-27: qty 1

## 2013-01-27 MED ORDER — AMLODIPINE BESYLATE 5 MG PO TABS: 5.0000 mg | ORAL_TABLET | Freq: Two times a day (BID) | ORAL | Status: DC

## 2013-01-27 NOTE — Progress Notes (Signed)
[facility]  Subjective: No further CP. Only feels dizzy when first standing, then dissipates after a few seconds.   Objective: Vital signs in last 24 hours: Temp:  [97.8 F (36.6 C)-98.3 F (36.8 C)] 98.1 F (36.7 C) (03/11 0521) Pulse Rate:  [65-74] 65 (03/11 0521) Resp:  [18-21] 18 (03/11 0521) BP: (139-165)/(50-70) 145/70 mmHg (03/11 0938) SpO2:  [98 %-100 %] 100 % (03/11 0923) Weight:  [225 lb 12 oz (102.4 kg)] 225 lb 12 oz (102.4 kg) (03/11 0521) Last BM Date: 01/26/13  Intake/Output from previous day: 03/10 0701 - 03/11 0700 In: 1468.4 [P.O.:1440; I.V.:28.4] Out: 300 [Urine:300] Intake/Output this shift:    Medications Current Facility-Administered Medications  Medication Dose Route Frequency Merilee Wible Last Rate Last Dose  . 0.9 %  sodium chloride infusion  250 mL Intravenous PRN Eda Paschal Kilroy, PA-C      . 0.9 %  sodium chloride infusion   Intravenous Continuous Abelino Derrick, PA-C   10 mL/hr at 01/26/13 0217  . acetaminophen (TYLENOL) tablet 650 mg  650 mg Oral Q4H PRN Abelino Derrick, PA-C      . ALPRAZolam Prudy Feeler) tablet 0.25 mg  0.25 mg Oral BID PRN Eda Paschal Kilroy, PA-C      . amLODipine (NORVASC) tablet 5 mg  5 mg Oral q morning - 10a Abelino Derrick, PA-C   5 mg at 01/27/13 4098  . antiseptic oral rinse (BIOTENE) solution 15 mL  15 mL Mouth Rinse BID Lennette Bihari, MD   15 mL at 01/27/13 0800  . aspirin EC tablet 81 mg  81 mg Oral Daily Lennette Bihari, MD   81 mg at 01/27/13 1191  . atorvastatin (LIPITOR) tablet 80 mg  80 mg Oral [address] [place], PA-C   80 mg at 01/26/13 1648  . budesonide-formoterol (SYMBICORT) 160-4.5 MCG/ACT inhaler 2 puff  2 puff Inhalation BID Abelino Derrick, PA-C   2 puff at 01/27/13 4782  . clopidogrel (PLAVIX) tablet 75 mg  75 mg Oral Q breakfast Abelino Derrick, PA-C   75 mg at 01/27/13 9562  . enoxaparin (LOVENOX) injection 30 mg  30 mg Subcutaneous Q24H Abelino Derrick, PA-C   30 mg at 01/27/13 1308  . ezetimibe  (ZETIA) tablet 10 mg  10 mg Oral q morning - 10a Abelino Derrick, PA-C   10 mg at 01/27/13 6578  . famotidine (PEPCID) tablet 20 mg  20 mg Oral Daily Lennette Bihari, MD   20 mg at 01/27/13 [phone]  . ferrous sulfate tablet 325 mg  325 mg Oral Q breakfast Abelino Derrick, PA-C   325 mg at 01/27/13 2952  . insulin aspart protamine-insulin aspart (NOVOLOG 70/30) injection 30 Units  30 Units Subcutaneous BID WC Lennette Bihari, MD   30 Units at 01/26/13 1648  . isosorbide mononitrate (IMDUR) 24 hr tablet 30 mg  30 mg Oral Daily Abelino Derrick, PA-C   30 mg at 01/27/13 8413  . loratadine (CLARITIN) tablet 10 mg  10 mg Oral q morning - 10a Abelino Derrick, PA-C   10 mg at 01/27/13 2440  . magnesium hydroxide (MILK OF MAGNESIA) suspension 30 mL  30 mL Oral Daily PRN Lennette Bihari, MD   30 mL at 01/26/13 1648  . nitroGLYCERIN (NITROSTAT) SL tablet 0.4 mg  0.4 mg Sublingual Q5 min PRN Hurman Horn, MD   0.4 mg at 01/25/13 2059  . nitroGLYCERIN (NITROSTAT) SL  tablet 0.4 mg  0.4 mg Sublingual Q5 Min x 3 PRN Eda Paschal Kilroy, PA-C      . ondansetron [facility]) injection 4 mg  4 mg Intravenous Q6H PRN Abelino Derrick, PA-C      . ranolazine (RANEXA) 12 hr tablet 500 mg  500 mg Oral BID Abelino Derrick, PA-C   500 mg at 01/27/13 1610  . sodium chloride 0.9 % injection 3 mL  3 mL Intravenous Q12H Abelino Derrick, PA-C   3 mL at 01/27/13 0942  . sodium chloride 0.9 % injection 3 mL  3 mL Intravenous PRN Abelino Derrick, PA-C      . zolpidem (AMBIEN) tablet 5 mg  5 mg Oral QHS PRN Abelino Derrick, PA-C        PE: General appearance: alert, cooperative and no distress Lungs: clear to auscultation bilaterally Heart: regular rate and rhythm Extremities: no LEE Pulses: 2+ and symmetric Skin: warm and dry Neurologic: Grossly normal  Lab Results:   Recent Labs  01/25/13 2118 01/26/13 0550  WBC 8.0 7.7  HGB 11.4* 11.0*  HCT 36.2 33.9*  PLT 230 203   BMET  Recent Labs  01/25/13 2118 01/26/13 0550  NA 139 139  K 5.0 4.3   CL 100 102  CO2 31 30  GLUCOSE 227* 233*  BUN 38* 41*  CREATININE 2.69* 2.35*  CALCIUM 9.6 9.0   Assessment/Plan  Principal Problem:   Complete heart block, transiet Active Problems:   Diabetes mellitus, type 2 IDDM   HYPERLIPIDEMIA   OBESITY   PERIPHERAL NEUROPATHY   HYPERTENSION   CAD, prior LAD/RCA/CFX stents, cath 09/12/12 with patent stents, low risk BNuc 01/14/13   HYPERTENSION, PULMONARY - partially due to LV diastolic dysfunction   DIASTOLIC DYSFUNCTION, normal LVF   COPD, chronic O2   DIZZINESS   SLEEP APNEA, on C-pap   CKD (chronic kidney disease) stage 3, GFR 30-59 ml/min  Plan: Bystolic and verapamil were D/C yesterday due to symptomatic complete heart block. Pt is maintaining NSR on telemetry. HR is in the 70s. First degree HB noted on telemetry but no further CHB. BP is stable with most recent being 145/70. No further CP. Continue holding Bystolic and verapamil for now. MD to follow with further recommendation.      LOS: 2 days    Brittainy M. Sharol Harness, PA-C 01/27/2013 10:11 AM

## 2013-01-27 NOTE — Discharge Summary (Signed)
Physician Discharge Summary  Patient ID: Krystal [name] MRN: [id number] DOB/AGE: Dec 11, 1935 77 y.o.  Admit date: 01/25/2013 Discharge date: 01/27/2013  Admission Diagnoses: Complete Heart Block  Discharge Diagnoses:  Principal Problem:   Complete heart block, transiet Active Problems:   Diabetes mellitus, type 2 IDDM   HYPERLIPIDEMIA   OBESITY   PERIPHERAL NEUROPATHY   HYPERTENSION   CAD, prior LAD/RCA/CFX stents, cath 09/12/12 with patent stents, low risk BNuc 01/14/13   HYPERTENSION, PULMONARY - partially due to LV diastolic dysfunction   DIASTOLIC DYSFUNCTION, normal LVF   COPD, chronic O2   DIZZINESS   SLEEP APNEA, on C-pap   CKD (chronic kidney disease) stage 3, GFR 30-59 ml/min   Discharged Condition: stable  Hospital Course: The patient is a 77 y/o female known to Dr Tresa Endo with a history of CAD. She has had a remote RCA stent in 1995. In Feb 2007 she had RCA ISR and a new RCA site Rx'd with Cypher stents. In Jan 2008 she had a LAD Cypher placed. Cath in April 2009, December 2010, and May 2012 have shown patency of these sites. The last cath was 10/13 after an abnormal Myoview. This again showed patent stents and she was treated medically. She saw Dr Tresa Endo on 12/24/12 and was complaining of some chest pain. A Myoview was done 01/14/13 that was low risk.   She initially presented to the ER at [facility] on 3/9 with complaints of 3 days of weakness and dizziness. She denied syncope. In the ER her EKG showed marked bradycardia and complete heart block. She was transported to [facility] for further care. She had been on Bystolic and verapamil. Both medications were held and she was started on low dose dopamine. She also had an increase in her baseline SCr and was borderline hyperkalemic. Cr was 2.69 and K+ was 5.0. Her ACE-I was therefore held. The following morning, she was in sinus rhythm with a heart rate in the 70's. Her blood pressure was normal. Dopamine was discontinued. On hospital day  2, she continued to maintain sinus rhythm and had no further complete heart block off of beta blocker and verapamil. She was however hypertensive with systolic pressures in the 150's -160s. Her Norvasc was increased to 5 mg BID. Her Cr was trending down to 2.35.  She was last seen and examined by Dr. Rennis Golden, who felt she was stable for discharge home.  She is ordered to discontinue taking Bystolic, verapamil and lisinopril at home, and to increase Norvasc to two times per day. Arrangements have been made for her to pick up a holter monitor from [facility], to wear for 2 weeks to assess for further heart block. Follow up has been arranged at our [facility] office with Dr. Rennis Golden on 02/20/13.   Consults: None  Significant Diagnostic Studies: None  Treatments: See Hospital Course  Discharge Exam: Blood pressure 152/65, pulse 71, temperature 97.9 F (36.6 C), temperature source Oral, resp. rate 18, height 5\' 7"  (1.702 m), weight 225 lb 12 oz (102.4 kg), SpO2 100.00%.   Disposition: 06-Home-Health Care Svc      Discharge Orders   Future Orders Complete By Expires     Diet - low sodium heart healthy  As directed     Discharge instructions  As directed     Comments:      Increase Norvasc to 2 times a day Go to [facility] office in [place] on Wed. 3/12 to pick up heart monitor and wear for  2 weeks.    Increase activity slowly  As directed         Medication List    STOP taking these medications       lisinopril 10 MG tablet  Commonly known as:  PRINIVIL,ZESTRIL     Nebivolol HCl 20 MG Tabs     verapamil 120 MG CR tablet  Commonly known as:  CALAN-SR      TAKE these medications       ADVAIR DISKUS 250-50 MCG/DOSE Aepb  Generic drug:  Fluticasone-Salmeterol  Inhale 1 puff into the lungs 2 (two) times daily.     amLODipine 5 MG tablet  Commonly known as:  NORVASC  Take 1 tablet (5 mg total) by mouth 2 (two) times daily.     aspirin 81 MG EC tablet  Take 81 mg by  mouth every morning.     budesonide-formoterol 160-4.5 MCG/ACT inhaler  Commonly known as:  SYMBICORT  Inhale 2 puffs into the lungs 2 (two) times daily as needed (for shortness of breath).     clopidogrel 75 MG tablet  Commonly known as:  PLAVIX  Take 75 mg by mouth every morning.     ezetimibe 10 MG tablet  Commonly known as:  ZETIA  Take 10 mg by mouth every morning.     famotidine 20 MG tablet  Commonly known as:  PEPCID  Take 20 mg by mouth 2 (two) times daily.     ferrous sulfate 325 (65 FE) MG tablet  Take 325 mg by mouth daily.     furosemide 40 MG tablet  Commonly known as:  LASIX  Take 1 tablet (40 mg total) by mouth as needed (take one if your weight increases more than 2 lbs in 24hrs).     insulin lispro protamine-insulin lispro (75-25) 100 UNIT/ML Susp  Commonly known as:  HUMALOG 75/25  Inject 40-70 Units into the skin 2 (two) times daily with a meal. Takes 70 units with breakfast and 40 units with supper     isosorbide mononitrate 30 MG 24 hr tablet  Commonly known as:  IMDUR  Take 30 mg by mouth daily.     loratadine 10 MG tablet  Commonly known as:  CLARITIN  Take 10 mg by mouth daily as needed for allergies.     NITROSTAT 0.4 MG SL tablet  Generic drug:  nitroGLYCERIN  Take 1 tablet by mouth every 5 (five) minutes x 3 doses as needed. For chest pains     ranolazine 500 MG 12 hr tablet  Commonly known as:  RANEXA  Take 1 tablet (500 mg total) by mouth 2 (two) times daily.     rosuvastatin 20 MG tablet  Commonly known as:  CRESTOR  Take 20 mg by mouth every evening.       Follow-up Information   Follow up with Chrystie Nose, MD On 02/20/2013. (at [place] office 8:30 am )    Contact information:   [place] Office [phone]       Follow up with [facility] On 01/28/2013. (Go to [place] office to pick up heart monitor after 2:00pm)    Contact information:   [place] Office [phone]     Time Spent on  Discharge: 45 minutes  Signed: Allayne Butcher, PA-C 01/27/2013, 4:41 PM

## 2013-01-27 NOTE — Progress Notes (Signed)
Pt discharged per MD order and protocol. Discharge instructions reviewed with patient and all questions answered. Prescription given to pt. Pt aware of all follow up appointments.  Martyn Malay, RN

## 2013-01-27 NOTE — Progress Notes (Addendum)
Pt. Seen and examined. Agree with the NP/PA-C note as written.  Complete heart block has resolved. Maintaining sinus.  Hypertensive now that b-blocker and verapamil have been stopped. On amlodipine 5 mg - will change to 5 mg po BID. Renal function improved somewhat. Ok for discharge home today. Follow-up with Dr. Tresa Endo, Dr. Allyson Sabal or myself in the office in [place] in 3-4 weeks - will arrange 2 week outpatient monitor to evaluate for further heart block.   Chrystie Nose, MD, [facility] Attending Cardiologist [facility]

## 2013-01-28 DIAGNOSIS — I442 Atrioventricular block, complete: Secondary | ICD-10-CM | POA: Diagnosis not present

## 2013-02-05 DIAGNOSIS — E1059 Type 1 diabetes mellitus with other circulatory complications: Secondary | ICD-10-CM | POA: Diagnosis not present

## 2013-02-05 DIAGNOSIS — I739 Peripheral vascular disease, unspecified: Secondary | ICD-10-CM | POA: Diagnosis not present

## 2013-02-05 DIAGNOSIS — L84 Corns and callosities: Secondary | ICD-10-CM | POA: Diagnosis not present

## 2013-02-05 DIAGNOSIS — L608 Other nail disorders: Secondary | ICD-10-CM | POA: Diagnosis not present

## 2013-02-19 DIAGNOSIS — I2510 Atherosclerotic heart disease of native coronary artery without angina pectoris: Secondary | ICD-10-CM | POA: Diagnosis not present

## 2013-02-19 DIAGNOSIS — E119 Type 2 diabetes mellitus without complications: Secondary | ICD-10-CM | POA: Diagnosis not present

## 2013-02-19 DIAGNOSIS — I10 Essential (primary) hypertension: Secondary | ICD-10-CM | POA: Diagnosis not present

## 2013-02-20 DIAGNOSIS — I2789 Other specified pulmonary heart diseases: Secondary | ICD-10-CM | POA: Diagnosis not present

## 2013-02-20 DIAGNOSIS — Z79899 Other long term (current) drug therapy: Secondary | ICD-10-CM | POA: Diagnosis not present

## 2013-02-20 DIAGNOSIS — I442 Atrioventricular block, complete: Secondary | ICD-10-CM | POA: Diagnosis not present

## 2013-02-20 DIAGNOSIS — I2510 Atherosclerotic heart disease of native coronary artery without angina pectoris: Secondary | ICD-10-CM | POA: Diagnosis not present

## 2013-02-23 DIAGNOSIS — I10 Essential (primary) hypertension: Secondary | ICD-10-CM | POA: Diagnosis not present

## 2013-03-10 DIAGNOSIS — I10 Essential (primary) hypertension: Secondary | ICD-10-CM | POA: Diagnosis not present

## 2013-03-10 DIAGNOSIS — E119 Type 2 diabetes mellitus without complications: Secondary | ICD-10-CM | POA: Diagnosis not present

## 2013-03-10 DIAGNOSIS — I2510 Atherosclerotic heart disease of native coronary artery without angina pectoris: Secondary | ICD-10-CM | POA: Diagnosis not present

## 2013-03-19 DIAGNOSIS — E1129 Type 2 diabetes mellitus with other diabetic kidney complication: Secondary | ICD-10-CM | POA: Diagnosis not present

## 2013-03-19 DIAGNOSIS — E7800 Pure hypercholesterolemia, unspecified: Secondary | ICD-10-CM | POA: Diagnosis not present

## 2013-03-19 DIAGNOSIS — E1165 Type 2 diabetes mellitus with hyperglycemia: Secondary | ICD-10-CM | POA: Diagnosis not present

## 2013-03-19 DIAGNOSIS — I10 Essential (primary) hypertension: Secondary | ICD-10-CM | POA: Diagnosis not present

## 2013-03-25 DIAGNOSIS — G4730 Sleep apnea, unspecified: Secondary | ICD-10-CM | POA: Diagnosis not present

## 2013-03-25 DIAGNOSIS — J449 Chronic obstructive pulmonary disease, unspecified: Secondary | ICD-10-CM | POA: Diagnosis not present

## 2013-03-25 DIAGNOSIS — E109 Type 1 diabetes mellitus without complications: Secondary | ICD-10-CM | POA: Diagnosis not present

## 2013-03-25 DIAGNOSIS — I10 Essential (primary) hypertension: Secondary | ICD-10-CM | POA: Diagnosis not present

## 2013-03-27 ENCOUNTER — Emergency Department (HOSPITAL_COMMUNITY)
Admission: EM | Admit: 2013-03-27 | Discharge: 2013-03-27 | Disposition: A | Discharge status: Home / Self Care | Payer: Medicare Other | Attending: Emergency Medicine | Admitting: Emergency Medicine

## 2013-03-27 ENCOUNTER — Encounter (HOSPITAL_COMMUNITY): Payer: Self-pay | Admitting: *Deleted

## 2013-03-27 DIAGNOSIS — H409 Unspecified glaucoma: Secondary | ICD-10-CM | POA: Insufficient documentation

## 2013-03-27 DIAGNOSIS — I252 Old myocardial infarction: Secondary | ICD-10-CM | POA: Insufficient documentation

## 2013-03-27 DIAGNOSIS — M1990 Unspecified osteoarthritis, unspecified site: Secondary | ICD-10-CM | POA: Diagnosis not present

## 2013-03-27 DIAGNOSIS — N183 Chronic kidney disease, stage 3 unspecified: Secondary | ICD-10-CM | POA: Diagnosis not present

## 2013-03-27 DIAGNOSIS — I2510 Atherosclerotic heart disease of native coronary artery without angina pectoris: Secondary | ICD-10-CM | POA: Diagnosis not present

## 2013-03-27 DIAGNOSIS — Z860100 Personal history of colon polyps, unspecified: Secondary | ICD-10-CM | POA: Insufficient documentation

## 2013-03-27 DIAGNOSIS — Z794 Long term (current) use of insulin: Secondary | ICD-10-CM | POA: Insufficient documentation

## 2013-03-27 DIAGNOSIS — D509 Iron deficiency anemia, unspecified: Secondary | ICD-10-CM | POA: Insufficient documentation

## 2013-03-27 DIAGNOSIS — E119 Type 2 diabetes mellitus without complications: Secondary | ICD-10-CM | POA: Diagnosis not present

## 2013-03-27 DIAGNOSIS — Z8679 Personal history of other diseases of the circulatory system: Secondary | ICD-10-CM | POA: Diagnosis not present

## 2013-03-27 DIAGNOSIS — T23009A Burn of unspecified degree of unspecified hand, unspecified site, initial encounter: Secondary | ICD-10-CM | POA: Insufficient documentation

## 2013-03-27 DIAGNOSIS — Z880 Allergy status to penicillin: Secondary | ICD-10-CM | POA: Diagnosis not present

## 2013-03-27 DIAGNOSIS — K219 Gastro-esophageal reflux disease without esophagitis: Secondary | ICD-10-CM | POA: Insufficient documentation

## 2013-03-27 DIAGNOSIS — Z87891 Personal history of nicotine dependence: Secondary | ICD-10-CM | POA: Insufficient documentation

## 2013-03-27 DIAGNOSIS — Z8639 Personal history of other endocrine, nutritional and metabolic disease: Secondary | ICD-10-CM | POA: Insufficient documentation

## 2013-03-27 DIAGNOSIS — G4730 Sleep apnea, unspecified: Secondary | ICD-10-CM | POA: Insufficient documentation

## 2013-03-27 DIAGNOSIS — Z7982 Long term (current) use of aspirin: Secondary | ICD-10-CM | POA: Insufficient documentation

## 2013-03-27 DIAGNOSIS — I509 Heart failure, unspecified: Secondary | ICD-10-CM | POA: Diagnosis not present

## 2013-03-27 DIAGNOSIS — I129 Hypertensive chronic kidney disease with stage 1 through stage 4 chronic kidney disease, or unspecified chronic kidney disease: Secondary | ICD-10-CM | POA: Insufficient documentation

## 2013-03-27 DIAGNOSIS — Z8542 Personal history of malignant neoplasm of other parts of uterus: Secondary | ICD-10-CM | POA: Insufficient documentation

## 2013-03-27 DIAGNOSIS — Z7902 Long term (current) use of antithrombotics/antiplatelets: Secondary | ICD-10-CM | POA: Insufficient documentation

## 2013-03-27 DIAGNOSIS — Z9861 Coronary angioplasty status: Secondary | ICD-10-CM | POA: Diagnosis not present

## 2013-03-27 DIAGNOSIS — Y9389 Activity, other specified: Secondary | ICD-10-CM | POA: Insufficient documentation

## 2013-03-27 DIAGNOSIS — E785 Hyperlipidemia, unspecified: Secondary | ICD-10-CM | POA: Diagnosis not present

## 2013-03-27 DIAGNOSIS — X12XXXA Contact with other hot fluids, initial encounter: Secondary | ICD-10-CM | POA: Insufficient documentation

## 2013-03-27 DIAGNOSIS — Y9229 Other specified public building as the place of occurrence of the external cause: Secondary | ICD-10-CM | POA: Insufficient documentation

## 2013-03-27 DIAGNOSIS — Z862 Personal history of diseases of the blood and blood-forming organs and certain disorders involving the immune mechanism: Secondary | ICD-10-CM | POA: Insufficient documentation

## 2013-03-27 DIAGNOSIS — T24019A Burn of unspecified degree of unspecified thigh, initial encounter: Secondary | ICD-10-CM | POA: Diagnosis not present

## 2013-03-27 DIAGNOSIS — H9190 Unspecified hearing loss, unspecified ear: Secondary | ICD-10-CM | POA: Insufficient documentation

## 2013-03-27 DIAGNOSIS — IMO0001 Reserved for inherently not codable concepts without codable children: Secondary | ICD-10-CM | POA: Diagnosis not present

## 2013-03-27 DIAGNOSIS — T24119A Burn of first degree of unspecified thigh, initial encounter: Secondary | ICD-10-CM | POA: Insufficient documentation

## 2013-03-27 DIAGNOSIS — Z79899 Other long term (current) drug therapy: Secondary | ICD-10-CM | POA: Insufficient documentation
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
## 2013-03-27 NOTE — ED Notes (Signed)
Pt sitting in bedside chair at time of d/c. No blistering noted to areas on the hand. When asked to rate pain for d/c documentation, she said it was "about the same, sometimes a ten" Pt sitting quietly. Ambulated out without difficulty.
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
## 2013-03-30 DIAGNOSIS — E119 Type 2 diabetes mellitus without complications: Secondary | ICD-10-CM | POA: Diagnosis not present

## 2013-03-30 DIAGNOSIS — T24009A Burn of unspecified degree of unspecified site of unspecified lower limb, except ankle and foot, initial encounter: Secondary | ICD-10-CM | POA: Diagnosis not present

## 2013-04-06 DIAGNOSIS — E119 Type 2 diabetes mellitus without complications: Secondary | ICD-10-CM | POA: Diagnosis not present

## 2013-04-06 DIAGNOSIS — T24009A Burn of unspecified degree of unspecified site of unspecified lower limb, except ankle and foot, initial encounter: Secondary | ICD-10-CM | POA: Diagnosis not present

## 2013-04-15 DIAGNOSIS — I10 Essential (primary) hypertension: Secondary | ICD-10-CM | POA: Diagnosis not present

## 2013-04-15 DIAGNOSIS — E049 Nontoxic goiter, unspecified: Secondary | ICD-10-CM | POA: Diagnosis not present

## 2013-04-20 ENCOUNTER — Other Ambulatory Visit (HOSPITAL_COMMUNITY): Payer: Self-pay | Admitting: Cardiovascular Disease

## 2013-04-20 DIAGNOSIS — T24009A Burn of unspecified degree of unspecified site of unspecified lower limb, except ankle and foot, initial encounter: Secondary | ICD-10-CM | POA: Diagnosis not present

## 2013-04-23 ENCOUNTER — Ambulatory Visit (HOSPITAL_COMMUNITY)
Admission: RE | Admit: 2013-04-23 | Discharge: 2013-04-23 | Disposition: A | Discharge status: Home / Self Care | Payer: Medicare Other | Source: Ambulatory Visit | Attending: Family Medicine | Admitting: Family Medicine

## 2013-04-23 DIAGNOSIS — Z1231 Encounter for screening mammogram for malignant neoplasm of breast: Secondary | ICD-10-CM | POA: Diagnosis not present

## 2013-04-23 DIAGNOSIS — Z1211 Encounter for screening for malignant neoplasm of colon: Secondary | ICD-10-CM

## 2013-04-28 DIAGNOSIS — E049 Nontoxic goiter, unspecified: Secondary | ICD-10-CM | POA: Diagnosis not present

## 2013-05-14 DIAGNOSIS — L84 Corns and callosities: Secondary | ICD-10-CM | POA: Diagnosis not present

## 2013-05-14 DIAGNOSIS — L608 Other nail disorders: Secondary | ICD-10-CM | POA: Diagnosis not present

## 2013-05-14 DIAGNOSIS — I739 Peripheral vascular disease, unspecified: Secondary | ICD-10-CM | POA: Diagnosis not present

## 2013-05-14 DIAGNOSIS — E1059 Type 1 diabetes mellitus with other circulatory complications: Secondary | ICD-10-CM | POA: Diagnosis not present

## 2013-06-10 ENCOUNTER — Ambulatory Visit (INDEPENDENT_AMBULATORY_CARE_PROVIDER_SITE_OTHER): Payer: Medicare Other | Admitting: Endocrinology

## 2013-06-10 ENCOUNTER — Encounter: Payer: Self-pay | Admitting: Endocrinology

## 2013-06-10 VITALS — BP 140/70 | HR 98 | Resp 14 | Ht 65.5 in | Wt 224.1 lb

## 2013-06-10 DIAGNOSIS — E1165 Type 2 diabetes mellitus with hyperglycemia: Secondary | ICD-10-CM

## 2013-06-10 DIAGNOSIS — E785 Hyperlipidemia, unspecified: Secondary | ICD-10-CM

## 2013-06-10 DIAGNOSIS — IMO0001 Reserved for inherently not codable concepts without codable children: Secondary | ICD-10-CM

## 2013-06-10 DIAGNOSIS — I10 Essential (primary) hypertension: Secondary | ICD-10-CM | POA: Diagnosis not present

## 2013-06-10 DIAGNOSIS — E1129 Type 2 diabetes mellitus with other diabetic kidney complication: Secondary | ICD-10-CM

## 2013-06-10 DIAGNOSIS — N189 Chronic kidney disease, unspecified: Secondary | ICD-10-CM

## 2013-06-10 DIAGNOSIS — E049 Nontoxic goiter, unspecified: Secondary | ICD-10-CM

## 2013-06-10 NOTE — Progress Notes (Signed)
Patient ID: Krystal [name], female   DOB: 06-05-1936, 77 y.o.   MRN: [id number]  Reason for Appointment: Diabetes follow-up   History of Present Illness    Diagnosis: date of diagnosis: 1999, Type 2 diabetes mellitus.   Complications: nephropathy.  Side effects from medications: Edema.  Proper timing of medications in relation to meals: Not consistently, Takes her insulin at breakfast in am but not always before supper.  Monitors blood glucose: Twice a day.  Glucometer: Freestyle.  Blood Glucose readings by recall: AM 200+; acl 190-200  acl 200 hs 200+   Hypoglycemia frequency: Once at 2:30 AM  Meals: 3 meals per day with breakfast at 10 am; usually minimal lunch; dinner 6 pm. The diet that the patient has been following is none, does not always restrict fat intake and will try to eat sugar-free desserts, eating out about twice a week.  Physical activity: exercise: none, gets short of breath on activity.  Dietician visit: 5/14.  CDE 6/14 Retinal exam: Most recent:12/13.   Current insulin dose: LANTUS insulin 55 UNITS IN AM DAILY; NOVOLOG 25 AT BREAKFAST, 20 AT LUNCH AND 35 AT SUPPER. Takes ac or just pc  PAST history: She has had long-standing diabetes which had been initially treated with oral agents. She was started on premixed insulin possibly in 2005. She also has been on metformin, Januvia, Actos and glipizide in the past. Actos was stopped in 2009 when she had edema. Metformin was stopped because of renal dysfunction Overall her blood sugar control has been poor with A1c consistently over 7% and since about 2009 has been mostly over 10%. INSULIN doses have been progressively increased. However her evening dose has been variable because of the tendency to hypoglycemia overnight; was taking pm dose hs. She had been continued on premixed insulin because of simplicity of dosing.   RECENT history: Because of poor control with premixed insulin and difficulty with consistent compliance with  midday insulin she was switched to a basal bolus regimen with Lantus and NovoLog. Although she was started on 60 units of Lantus she is only taking 55 units and blood sugars are still about 200 throughout the day Did not bring her monitor and not clear how much fluctuation she has. However her blood sugars are overall much better especially later in the day. She thinks she is doing a little better with compliance with mealtime insulin She has been to the nurse educator also for further education She has started back on Victoza which she only tried with a sample previously Also her stress level is better She did have one episode of low sugar at night possibly from eating smaller amounts at suppertime  The last HbgA1c was 9.7 in 5/14, was 10.1 % on 12/24/12.   The microalbumin has been tested , and the result is 860.   Hyperlipidemia:   Most recent lipids showed LDL measured: 98, HDL: 37. Currently on Crestor 40 mg and Zetia.       Medication List       This list is accurate as of: 06/10/13 11:59 PM.  Always use your most recent med list.               ADVAIR DISKUS 250-50 MCG/DOSE Aepb  Generic drug:  Fluticasone-Salmeterol  Inhale 1 puff into the lungs 2 (two) times daily.     amLODipine 5 MG tablet  Commonly known as:  NORVASC  Take 1 tablet (5 mg total) by mouth 2 (two) times daily.  aspirin 81 MG EC tablet  Take 81 mg by mouth every morning.     budesonide-formoterol 160-4.5 MCG/ACT inhaler  Commonly known as:  SYMBICORT  Inhale 2 puffs into the lungs 2 (two) times daily as needed (for shortness of breath).     clopidogrel 75 MG tablet  Commonly known as:  PLAVIX  Take 75 mg by mouth every morning.     ezetimibe 10 MG tablet  Commonly known as:  ZETIA  Take 10 mg by mouth every morning.     famotidine 20 MG tablet  Commonly known as:  PEPCID  Take 20 mg by mouth 2 (two) times daily.     ferrous sulfate 325 (65 FE) MG tablet  Take 325 mg by mouth daily.      furosemide 40 MG tablet  Commonly known as:  LASIX  Take 1 tablet (40 mg total) by mouth as needed (take one if your weight increases more than 2 lbs in 24hrs).     insulin aspart 100 UNIT/ML injection  Commonly known as:  novoLOG  Inject into the skin 3 (three) times daily with meals.     insulin glargine 100 UNIT/ML injection  Commonly known as:  LANTUS  Inject 55 Units into the skin at bedtime.     isosorbide mononitrate 30 MG 24 hr tablet  Commonly known as:  IMDUR  TAKE 1 TABLET ONCE DAILY.     loratadine 10 MG tablet  Commonly known as:  CLARITIN  Take 10 mg by mouth daily as needed for allergies.     NITROSTAT 0.4 MG SL tablet  Generic drug:  nitroGLYCERIN  Take 1 tablet by mouth every 5 (five) minutes x 3 doses as needed. For chest pains     ranolazine 500 MG 12 hr tablet  Commonly known as:  RANEXA  Take 1,000 mg by mouth 2 (two) times daily.     rosuvastatin 20 MG tablet  Commonly known as:  CRESTOR  Take 20 mg by mouth every evening.        Allergies:  Allergies  Allergen Reactions  . Morphine Shortness Of Breath and Swelling  . Penicillins Shortness Of Breath and Swelling    Past Medical History  Diagnosis Date  . Sleep apnea   . Occult blood in stools   . Diastolic dysfunction   . Myalgia   . Abnormal PFT   . DM (diabetes mellitus)     type II uncontrolled  . Palpitations   . Microalbuminuria   . Microcytic anemia   . DJD (degenerative joint disease)   . Constipation   . Abnormal heart rhythm   . CAD (coronary artery disease)     RCA stent in 1995. In Feb 2007 she had RCA ISR and a new RCA site Rx'd with Cypher stents. In Jan 2008 she had an LAD Cypher placed; Cath 09/12/12 - patent stents: 1 mLAD, 1 Cx, 3 in RCA  . Osteoarthritis   . Low back pain   . HTN (hypertension)   . Hyperlipidemia   . GERD (gastroesophageal reflux disease)   . History of colonic polyps   . Allergic rhinitis   . Personal history of colonic polyps   . HOH (hard of  hearing)   . MI (myocardial infarction) 98  . Glaucoma   . Diastolic dysfunction, left ventricle 09/12/12  . CHF (congestive heart failure)   . Cancer     uterine  . Acute renal failure due to procedure 09/18/2012  .  CKD (chronic kidney disease) stage 3, GFR 30-59 ml/min 09/18/2012    Past Surgical History  Procedure Laterality Date  . Ectopic pregnancy surgery    . Foot surgery      left and right for callous  . Angiplasty  99/05/06/07    x 5  . Cervical discectomy      L5 left/hemilminectomy  . Colonoscopy  09/2006    int hemmorhoids, COMPLICATED BY CARDIOPULMONARY COMPLICATIONS  . Abdominal hysterectomy      uterine cancer  . Polypectomy  10/22/2011    internal hemorrhoids/sessile polyp  . Coronary angioplasty  02/2008    5 cardiac stents total    Family History  Problem Relation Age of Onset  . Anesthesia problems Neg Hx   . Hypotension Neg Hx   . Malignant hyperthermia Neg Hx   . Pseudochol deficiency Neg Hx     Social History:  reports that she quit smoking about 6 years ago. Her smoking use included Cigarettes. She smoked 0.40 packs per day. She does not have any smokeless tobacco history on file. She reports that she does not drink alcohol or use illicit drugs.  Review of Systems - Cardiovascular ROS: She has a history of CAD Also has history of asthma/COPD She has a history of small goiter   Examination:   BP 140/70  Pulse 98  Resp 14  Ht 5' 5.5" (1.664 m)  Wt 224 lb 1.6 oz (101.651 kg)  BMI 36.71 kg/m2  SpO2 94%  Body mass index is 36.71 kg/(m^2).   Assesment:    1. Diabetes type 2, uncontrolled - 250.02  The patient's diabetes control appears to be overall better although still poorly controlled with home readings consistently over 200. She does seem to be benefiting from using Lantus instead of premixed insulin and fasting readings are better  Overall will need to higher doses of insulin except at suppertime She may have a tendency to low sugars  early at night if she is not eating a full meal and this needs to be assessed better with postprandial readings and review of meter download Again discussed principles of adjustment of Lantus insulin, how this works, timing of insulin and blood sugar target Also she will hopefully be able to adjust her mealtime dose based on portion sizes and carbohydrate intake. Discussed postprandial blood sugar targets Also her weight may stabilize better with continuing Victoza; this should have better effects physiologically when glucose toxicity has improved further  2. History of hyperlipidemia: We'll need followup levels   PLAN:  Increase Lantus to 65 units and reduce suppertime dose by 5 units,  Adjust insulin as discussed further especially with variable meal sizes Bring monitor on next visit  Counseling time over 50% of today's 25 minute visit  Deshanta Lady 06/16/2013, 10:36 AM

## 2013-06-10 NOTE — Patient Instructions (Addendum)
Gray ( lantus ) 65 UNITS IN AM DAILY;   NOVOLOG 25 AT BREAKFAST, 25 AT LUNCH AND 30 AT SUPPER

## 2013-06-16 DIAGNOSIS — E119 Type 2 diabetes mellitus without complications: Secondary | ICD-10-CM | POA: Insufficient documentation

## 2013-06-16 DIAGNOSIS — N189 Chronic kidney disease, unspecified: Secondary | ICD-10-CM | POA: Insufficient documentation

## 2013-06-23 DIAGNOSIS — I10 Essential (primary) hypertension: Secondary | ICD-10-CM | POA: Diagnosis not present

## 2013-06-23 DIAGNOSIS — E119 Type 2 diabetes mellitus without complications: Secondary | ICD-10-CM | POA: Diagnosis not present

## 2013-06-23 DIAGNOSIS — N289 Disorder of kidney and ureter, unspecified: Secondary | ICD-10-CM | POA: Diagnosis not present

## 2013-06-23 DIAGNOSIS — J309 Allergic rhinitis, unspecified: Secondary | ICD-10-CM | POA: Diagnosis not present

## 2013-06-25 DIAGNOSIS — E109 Type 1 diabetes mellitus without complications: Secondary | ICD-10-CM | POA: Diagnosis not present

## 2013-06-25 DIAGNOSIS — J449 Chronic obstructive pulmonary disease, unspecified: Secondary | ICD-10-CM | POA: Diagnosis not present

## 2013-06-25 DIAGNOSIS — N19 Unspecified kidney failure: Secondary | ICD-10-CM | POA: Diagnosis not present

## 2013-06-25 DIAGNOSIS — J019 Acute sinusitis, unspecified: Secondary | ICD-10-CM | POA: Diagnosis not present

## 2013-07-01 ENCOUNTER — Ambulatory Visit
Admission: RE | Admit: 2013-07-01 | Discharge: 2013-07-01 | Disposition: A | Discharge status: Home / Self Care | Payer: Medicare Other | Source: Ambulatory Visit | Attending: Family Medicine | Admitting: Family Medicine

## 2013-07-01 ENCOUNTER — Encounter: Payer: Self-pay | Admitting: Endocrinology

## 2013-07-01 ENCOUNTER — Other Ambulatory Visit: Payer: Self-pay | Admitting: Family Medicine

## 2013-07-01 ENCOUNTER — Ambulatory Visit (INDEPENDENT_AMBULATORY_CARE_PROVIDER_SITE_OTHER): Payer: Medicare Other | Admitting: Endocrinology

## 2013-07-01 VITALS — BP 136/70 | HR 83 | Resp 12 | Ht 67.0 in | Wt 224.3 lb

## 2013-07-01 DIAGNOSIS — E1165 Type 2 diabetes mellitus with hyperglycemia: Secondary | ICD-10-CM | POA: Diagnosis not present

## 2013-07-01 DIAGNOSIS — E785 Hyperlipidemia, unspecified: Secondary | ICD-10-CM | POA: Diagnosis not present

## 2013-07-01 DIAGNOSIS — R809 Proteinuria, unspecified: Secondary | ICD-10-CM | POA: Diagnosis not present

## 2013-07-01 DIAGNOSIS — M25551 Pain in right hip: Secondary | ICD-10-CM

## 2013-07-01 DIAGNOSIS — E1129 Type 2 diabetes mellitus with other diabetic kidney complication: Secondary | ICD-10-CM | POA: Diagnosis not present

## 2013-07-01 DIAGNOSIS — M169 Osteoarthritis of hip, unspecified: Secondary | ICD-10-CM | POA: Diagnosis not present

## 2013-07-01 NOTE — Progress Notes (Signed)
Patient ID: Krystal [name], female   DOB: 04-07-1936, 77 y.o.   MRN: [id number]  Reason for Appointment: Diabetes follow-up   History of Present Illness    Diagnosis: date of diagnosis: 1999, Type 2 diabetes mellitus.   PAST history: She has had long-standing diabetes which had been initially treated with oral agents. She was started on premixed insulin possibly in 2005. She also has been on metformin, Januvia, Actos and glipizide in the past. Actos was stopped in 2009 when she had edema. Metformin was stopped because of renal dysfunction Overall her blood sugar control has been poor with A1c consistently over 7% and since about 2009 has been mostly over 10%. INSULIN doses have been progressively increased. However her evening dose has been variable because of the tendency to hypoglycemia overnight; was taking pm dose hs. She had been continued on premixed insulin because of simplicity of dosing.   Complications: nephropathy.  Side effects from medications: Edema.  Proper timing of medications in relation to meals: Not consistently, Takes her insulin at breakfast in am but not always before supper.  Monitors blood glucose: Twice a day.  Glucometer: Freestyle.  Blood Glucose readings: Fasting 166-391 and on fasting 149-443 with higher readings in the evenings and occasional good readings at 2 AM or 5 PM  Hypoglycemia frequency: none  Current insulin dose: LANTUS insulin 65 h.s.; NOVOLOG 30 AT BREAKFAST, 25 AT LUNCH AND 35 AT SUPPER. Takes ac or just pc  Meals: 3 meals per day with breakfast at 10 am; usually minimal lunch; dinner 5-6 pm. The diet that the patient has been following is none, does not always restrict fat intake and will try to eat sugar-free desserts, eating out about twice a week.  Physical activity: exercise: none, gets short of breath on activity.  Dietician visit: 5/14.  CDE 6/14 Retinal exam: Most recent:12/13.   RECENT history: Because of poor control with premixed  insulin and difficulty with consistent compliance with midday insulin she was switched to a basal bolus regimen with Lantus and NovoLog. Appears that initially the readings were better with this regimen but they are high again. She is again getting noncompliant with insulin at mealtimes and does not understand she can keep the insulin with her when eating out Also made her take her Lantus on waking up but later when she has breakfast She has been to the nurse educator also for education a couple of times She has again not been taking Victoza as directed  The last HbgA1c was 9.7 in 5/14, was 10.1 % on 12/24/12.   The microalbumin has been tested , and the result was 860.   Hyperlipidemia:   Most recent lipids showed LDL measured: 98, HDL: 37. Currently on Crestor 40 mg and Zetia.       Medication List       This list is accurate as of: 07/01/13 11:29 AM.  Always use your most recent med list.               ADVAIR DISKUS 250-50 MCG/DOSE Aepb  Generic drug:  Fluticasone-Salmeterol  Inhale 1 puff into the lungs 2 (two) times daily.     amLODipine 5 MG tablet  Commonly known as:  NORVASC  Take 1 tablet (5 mg total) by mouth 2 (two) times daily.     aspirin 81 MG EC tablet  Take 81 mg by mouth every morning.     budesonide-formoterol 160-4.5 MCG/ACT inhaler  Commonly known as:  SYMBICORT  Inhale  2 puffs into the lungs 2 (two) times daily as needed (for shortness of breath).     clopidogrel 75 MG tablet  Commonly known as:  PLAVIX  Take 75 mg by mouth every morning.     ezetimibe 10 MG tablet  Commonly known as:  ZETIA  Take 10 mg by mouth every morning.     famotidine 20 MG tablet  Commonly known as:  PEPCID  Take 20 mg by mouth 2 (two) times daily.     ferrous sulfate 325 (65 FE) MG tablet  Take 325 mg by mouth daily.     furosemide 40 MG tablet  Commonly known as:  LASIX  Take 1 tablet (40 mg total) by mouth as needed (take one if your weight increases more than 2 lbs  in 24hrs).     insulin aspart 100 UNIT/ML injection  Commonly known as:  novoLOG  Inject 30 Units into the skin 3 (three) times daily with meals. Take 25 units at breakfast and lunch and 30 units at supper     insulin glargine 100 UNIT/ML injection  Commonly known as:  LANTUS  Inject 65 Units into the skin at bedtime.     isosorbide mononitrate 30 MG 24 hr tablet  Commonly known as:  IMDUR  TAKE 1 TABLET ONCE DAILY.     loratadine 10 MG tablet  Commonly known as:  CLARITIN  Take 10 mg by mouth daily as needed for allergies.     NITROSTAT 0.4 MG SL tablet  Generic drug:  nitroGLYCERIN  Take 1 tablet by mouth every 5 (five) minutes x 3 doses as needed. For chest pains     RANEXA 1000 MG SR tablet  Generic drug:  ranolazine  Take 1,000 mg by mouth 2 (two) times daily.     rosuvastatin 20 MG tablet  Commonly known as:  CRESTOR  Take 20 mg by mouth every evening.        Allergies:  Allergies  Allergen Reactions  . Morphine Shortness Of Breath and Swelling  . Penicillins Shortness Of Breath and Swelling    Past Medical History  Diagnosis Date  . Sleep apnea   . Occult blood in stools   . Diastolic dysfunction   . Myalgia   . Abnormal PFT   . DM (diabetes mellitus)     type II uncontrolled  . Palpitations   . Microalbuminuria   . Microcytic anemia   . DJD (degenerative joint disease)   . Constipation   . Abnormal heart rhythm   . CAD (coronary artery disease)     RCA stent in 1995. In Feb 2007 she had RCA ISR and a new RCA site Rx'd with Cypher stents. In Jan 2008 she had an LAD Cypher placed; Cath 09/12/12 - patent stents: 1 mLAD, 1 Cx, 3 in RCA  . Osteoarthritis   . Low back pain   . HTN (hypertension)   . Hyperlipidemia   . GERD (gastroesophageal reflux disease)   . History of colonic polyps   . Allergic rhinitis   . Personal history of colonic polyps   . HOH (hard of hearing)   . MI (myocardial infarction) 98  . Glaucoma   . Diastolic dysfunction, left  ventricle 09/12/12  . CHF (congestive heart failure)   . Cancer     uterine  . Acute renal failure due to procedure 09/18/2012  . CKD (chronic kidney disease) stage 3, GFR 30-59 ml/min 09/18/2012    Past Surgical History  Procedure Laterality Date  . Ectopic pregnancy surgery    . Foot surgery      left and right for callous  . Angiplasty  99/05/06/07    x 5  . Cervical discectomy      L5 left/hemilminectomy  . Colonoscopy  09/2006    int hemmorhoids, COMPLICATED BY CARDIOPULMONARY COMPLICATIONS  . Abdominal hysterectomy      uterine cancer  . Polypectomy  10/22/2011    internal hemorrhoids/sessile polyp  . Coronary angioplasty  02/2008    5 cardiac stents total    Family History  Problem Relation Age of Onset  . Anesthesia problems Neg Hx   . Hypotension Neg Hx   . Malignant hyperthermia Neg Hx   . Pseudochol deficiency Neg Hx     Social History:  reports that she quit smoking about 6 years ago. Her smoking use included Cigarettes. She smoked 0.40 packs per day. She does not have any smokeless tobacco history on file. She reports that she does not drink alcohol or use illicit drugs.  Review of Systems - Cardiovascular ROS: She has a history of CAD Also has history of asthma/COPD She has a history of small goiter   Examination:   BP 136/70  Pulse 83  Resp 12  Ht 5\' 7"  (1.702 m)  Wt 224 lb 4.8 oz (101.742 kg)  BMI 35.12 kg/m2  SpO2 99%  Body mass index is 35.12 kg/(m^2).   No edema  Assesment:   1. Diabetes type 2, uncontrolled - 250.02  The patient's diabetes control appears to be overall worse  with home readings mostly over 200. Has a few better readings recently She does seem to be benefiting from using Lantus instead of premixed insulin and fasting readings are better  Overall will need higher doses of insulin including Lantus Discussed need for better compliance with all insulin doses especially at meals and she can carry the insulin pen with her when  eating out She has difficulty understanding instructions and written instruction reviewed with her in detail Also discussed restarting the Victoza which will help was prandial readings Also her weight may stabilize better with Victoza; this should have better effects physiologically when glucose toxicity has improved further  2. History of hyperlipidemia: we'll need followup levels  PLAN:  Increase Lantus to 75 units and keep mealtime doses at breakfast and lunch unchanged for now Since the glucose is highest at bedtime we'll increase the suppertime dose to 40 She will call if blood sugars are not improving or if she is having any low sugars Adjust insulin on followup visit  Counseling time over 50% of today's 25 minute visit  Timm Bonenberger 07/01/2013, 11:29 AM

## 2013-07-01 NOTE — Patient Instructions (Addendum)
TAKE GRAY insulin on waking up and increase the dose to 75 units  Start VICTOZA injection with the sample pen once daily at the same time of the day.  Dial the dose to 0.6 mg for the first week.  You may  experience nausea in the first few days which usually gets better the After 1 week increase the dose to 1.2mg  daily if no nausea.  You may inject in the stomach, thigh or arm.   You will feel fullness of the stomach with starting the medication and should try to keep portions of food small.    NovoLog/orange pen: Take this before each meal and may take it with you when eating out. Increase the dose to 40 units before supper.  May take extra 10 units at mealtimes on top of the usual dosage blood sugar is over 200

## 2013-07-22 ENCOUNTER — Other Ambulatory Visit: Payer: Self-pay | Admitting: *Deleted

## 2013-07-22 MED ORDER — FAMOTIDINE 20 MG PO TABS: 20.0000 mg | ORAL_TABLET | Freq: Two times a day (BID) | ORAL | Status: DC

## 2013-07-22 NOTE — Telephone Encounter (Signed)
Rx was sent to pharmacy electronically. 

## 2013-07-28 ENCOUNTER — Encounter: Payer: Self-pay | Admitting: Cardiovascular Disease

## 2013-07-28 ENCOUNTER — Ambulatory Visit (INDEPENDENT_AMBULATORY_CARE_PROVIDER_SITE_OTHER): Payer: Medicare Other | Admitting: Cardiovascular Disease

## 2013-07-28 VITALS — BP 140/82 | HR 88 | Ht 67.0 in | Wt 228.5 lb

## 2013-07-28 DIAGNOSIS — J449 Chronic obstructive pulmonary disease, unspecified: Secondary | ICD-10-CM

## 2013-07-28 DIAGNOSIS — R0602 Shortness of breath: Secondary | ICD-10-CM | POA: Diagnosis not present

## 2013-07-28 DIAGNOSIS — G4730 Sleep apnea, unspecified: Secondary | ICD-10-CM

## 2013-07-28 DIAGNOSIS — I2510 Atherosclerotic heart disease of native coronary artery without angina pectoris: Secondary | ICD-10-CM

## 2013-07-28 DIAGNOSIS — E559 Vitamin D deficiency, unspecified: Secondary | ICD-10-CM

## 2013-07-28 DIAGNOSIS — E785 Hyperlipidemia, unspecified: Secondary | ICD-10-CM | POA: Diagnosis not present

## 2013-07-28 DIAGNOSIS — R5381 Other malaise: Secondary | ICD-10-CM

## 2013-07-28 DIAGNOSIS — E119 Type 2 diabetes mellitus without complications: Secondary | ICD-10-CM

## 2013-07-28 DIAGNOSIS — R002 Palpitations: Secondary | ICD-10-CM

## 2013-07-28 DIAGNOSIS — R42 Dizziness and giddiness: Secondary | ICD-10-CM

## 2013-07-28 MED ORDER — NEBIVOLOL HCL 2.5 MG PO TABS: 2.5000 mg | ORAL_TABLET | Freq: Every day | ORAL | Status: DC

## 2013-07-28 MED ORDER — ISOSORBIDE MONONITRATE ER 60 MG PO TB24: ORAL_TABLET | ORAL | Status: DC

## 2013-07-28 NOTE — Patient Instructions (Addendum)
Your physician has recommended you make the following change in your medication Increase the isosorbide to 60 mg daily. Bystolic 2.5 mg daily. These new  Prescriptions has already been sent to your pharmacy.  Your physician recommends that you return for lab work fasting prior to your next visit.  Your physician recommends that you schedule a follow-up appointment in: 3-4 weeks.Marland Kitchen

## 2013-08-03 ENCOUNTER — Ambulatory Visit: Payer: Medicare Other | Admitting: Endocrinology

## 2013-08-06 ENCOUNTER — Ambulatory Visit: Payer: Medicare Other | Admitting: Endocrinology

## 2013-08-09 ENCOUNTER — Encounter: Payer: Self-pay | Admitting: Cardiovascular Disease

## 2013-08-09 NOTE — Progress Notes (Signed)
Patient ID: Krystal [name], female   DOB: 1936/06/06, 77 y.o.   MRN: [id number]     HPI: Krystal [name], is a 77 y.o. female who presents to the office for a five-month cardiology evaluation. I last saw her in December 2013 but apparently she did see Dr. Rennis [name] in April 2014. Krystal [name] has a history of hypertension, type 2 diabetes mellitus, obstructive sleep apnea on CPAP therapy, and has documented diastolic dysfunction. She has coronary artery disease dating back to the 90s and in the past had undergone multiple stent procedures with stents located in the proximal LAD, 80 groove circumflex, mid right coronary artery, and distal right coronary artery. Her last catheterization was done in October 2013 by Dr. Eugene [name] not found to have high-grade restenosis. She did have 80-90% ostial stenosis and a small jailed first septal perforating artery. She had mild calcification at 2030% narrowing in the stent in the circumflex vessel. The right coronary artery had 3 stents and there was 40-50% in-stent smooth narrowing with 30% narrowing in the distal stent. Ejection fraction was 65%. She did have moderate pulmonary hypertension with a PA pressure 47/20 was found to have acute on chronic diastolic left ventricular failure. Recently, she has noticed weakness at times and dizziness. She also feels at times her heart "jumps ."  She does note some occasional episodes of chest tightness. She is on oxygen 2 L in the past, while she was on high-dose verapamil and Bystolic she did develop transient heart block and these medicines were ultimately discontinued.  Past Medical History  Diagnosis Date  . Sleep apnea   . Occult blood in stools   . Diastolic dysfunction   . Myalgia   . Abnormal PFT   . DM (diabetes mellitus)     type II uncontrolled  . Palpitations   . Microalbuminuria   . Microcytic anemia   . DJD (degenerative joint disease)   . Constipation   . Abnormal heart rhythm   . CAD (coronary  artery disease)     RCA stent in 1995. In Feb 2007 she had RCA ISR and a new RCA site Rx'd with Cypher stents. In Jan 2008 she had an LAD Cypher placed; Cath 09/12/12 - patent stents: 1 mLAD, 1 Cx, 3 in RCA  . Osteoarthritis   . Low back pain   . HTN (hypertension)   . Hyperlipidemia   . GERD (gastroesophageal reflux disease)   . History of colonic polyps   . Allergic rhinitis   . Personal history of colonic polyps   . HOH (hard of hearing)   . MI (myocardial infarction) 98  . Glaucoma   . Diastolic dysfunction, left ventricle 09/12/12  . CHF (congestive heart failure)   . Cancer     uterine  . Acute renal failure due to procedure 09/18/2012  . CKD (chronic kidney disease) stage 3, GFR 30-59 ml/min 09/18/2012    Past Surgical History  Procedure Laterality Date  . Ectopic pregnancy surgery    . Foot surgery      left and right for callous  . Angiplasty  99/05/06/07    x 5  . Cervical discectomy      L5 left/hemilminectomy  . Colonoscopy  09/2006    int hemmorhoids, COMPLICATED BY CARDIOPULMONARY COMPLICATIONS  . Abdominal hysterectomy      uterine cancer  . Polypectomy  10/22/2011    internal hemorrhoids/sessile polyp  . Coronary angioplasty  02/2008    5 cardiac stents total  Allergies  Allergen Reactions  . Morphine Shortness Of Breath and Swelling  . Penicillins Shortness Of Breath and Swelling    Current Outpatient Prescriptions  Medication Sig Dispense Refill  . amLODipine (NORVASC) 5 MG tablet Take 1 tablet (5 mg total) by mouth 2 (two) times daily.  60 tablet  5  . aspirin 81 MG EC tablet Take 81 mg by mouth every morning.       . clopidogrel (PLAVIX) 75 MG tablet Take 75 mg by mouth every morning.       . ezetimibe (ZETIA) 10 MG tablet Take 10 mg by mouth every morning.       . famotidine (PEPCID) 20 MG tablet Take 1 tablet (20 mg total) by mouth 2 (two) times daily.  60 tablet  9  . ferrous sulfate 325 (65 FE) MG tablet Take 325 mg by mouth daily.       .  Fluticasone-Salmeterol (ADVAIR DISKUS) 250-50 MCG/DOSE AEPB Inhale 1 puff into the lungs 2 (two) times daily.       . furosemide (LASIX) 40 MG tablet Take 1 tablet (40 mg total) by mouth as needed (take one if your weight increases more than 2 lbs in 24hrs).  30 tablet    . insulin aspart (NOVOLOG) 100 UNIT/ML injection Inject 30 Units into the skin 3 (three) times daily with meals. Take 25 units @ breakfast and lunch and 30 units @supper .      . insulin glargine (LANTUS) 100 UNIT/ML injection Inject 65 Units into the skin at bedtime.       . isosorbide mononitrate (IMDUR) 60 MG 24 hr tablet TAKE 1 TABLET ONCE DAILY.  30 tablet  6  . loratadine (CLARITIN) 10 MG tablet Take 10 mg by mouth daily as needed for allergies.      Marland Kitchen NITROSTAT 0.4 MG SL tablet Take 1 tablet by mouth every 5 (five) minutes x 3 doses as needed. For chest pains      . ranolazine (RANEXA) 1000 MG SR tablet Take 1,000 mg by mouth 2 (two) times daily.      . rosuvastatin (CRESTOR) 20 MG tablet Take 20 mg by mouth every evening.      . budesonide-formoterol (SYMBICORT) 160-4.5 MCG/ACT inhaler Inhale 2 puffs into the lungs 2 (two) times daily as needed (for shortness of breath).      . calcitRIOL (ROCALTROL) 0.25 MCG capsule       . HYDROcodone-acetaminophen (VICODIN) 5-500 MG per tablet       . montelukast (SINGULAIR) 10 MG tablet       . nebivolol (BYSTOLIC) 2.5 MG tablet Take 1 tablet (2.5 mg total) by mouth daily.  30 tablet  6  . potassium chloride (K-DUR) 10 MEQ tablet       . QC PEN NEEDLES 31G X 6 MM MISC        No current facility-administered medications for this visit.    History   Social History  . Marital Status: Widowed    Spouse Name: N/A    Number of Children: N/A  . Years of Education: N/A   Occupational History  . Not on file.   Social History Main Topics  . Smoking status: Former Smoker -- 0.40 packs/day    Types: Cigarettes    Quit date: 10/18/2006  . Smokeless tobacco: Not on file     Comment:  quit about 4 yrs ago  . Alcohol Use: No  . Drug Use: No  . Sexual Activity: Yes  Birth Control/ Protection: Surgical   Other Topics Concern  . Not on file   Social History Narrative  . No narrative on file    Family History  Problem Relation Age of Onset  . Anesthesia problems Neg Hx   . Hypotension Neg Hx   . Malignant hyperthermia Neg Hx   . Pseudochol deficiency Neg Hx     ROS is negative for fevers, chills or night sweats.  She does note weakness. At times there is some transient dizziness. She feels at times her heart is faster. She denies any syncope. Time she does note vague chest tightness. She denies bleeding. She is on 2 L of oxygen supplement during the day and uses her CPAP therapy with oxygen nocturnally. She at times noticed trace ankle swelling. She denies paresthesias Other system review is negative.  PE BP 140/82  Pulse 88  Ht 5\' 7"  (1.702 m)  Wt 228 lb 8 oz (103.647 kg)  BMI 35.78 kg/m2  General: Alert, oriented, no distress.  Skin: normal turgor, no rashes HEENT: Normocephalic, atraumatic. Pupils round and reactive; sclera anicteric;no lid lag.  Nose without nasal septal hypertrophy Mouth/Parynx benign; Mallinpatti scale 3/4 Neck: No JVD, no carotid briuts Lungs: clear to ausculatation and percussion; no wheezing or rales Heart: RRR, s1 s2 normal 1/6 systolic murmur Abdomen: Central adiposity soft, nontender; no hepatosplenomehaly, BS+; abdominal aorta nontender and not dilated by palpation. Pulses 2+ Extremities: no clubbing cyanosis or edema, Homan's sign negative  Neurologic: grossly nonfocal  ECG: Sinus rhythm at 88 beats per minute. Nonspecific T changes.  LABS:  BMET    Component Value Date/Time   NA 139 01/26/2013 0550   K 4.3 01/26/2013 0550   CL 102 01/26/2013 0550   CO2 30 01/26/2013 0550   GLUCOSE 233* 01/26/2013 0550   BUN 41* 01/26/2013 0550   CREATININE 2.35* 01/26/2013 0550   CALCIUM 9.0 01/26/2013 0550   GFRNONAA 19* 01/26/2013 0550    GFRAA 22* 01/26/2013 0550     Hepatic Function Panel     Component Value Date/Time   PROT 6.5 01/25/2013 2140   ALBUMIN 3.3* 01/25/2013 2140   AST 14 01/25/2013 2140   ALT 17 01/25/2013 2140   ALKPHOS 65 01/25/2013 2140   BILITOT 0.2* 01/25/2013 2140   BILIDIR <0.1 01/25/2013 2140   IBILI NOT CALCULATED 01/25/2013 2140     CBC    Component Value Date/Time   WBC 7.7 01/26/2013 0550   RBC 4.55 01/26/2013 0550   HGB 11.0* 01/26/2013 0550   HCT 33.9* 01/26/2013 0550   PLT 203 01/26/2013 0550   MCV 74.5* 01/26/2013 0550   MCH 24.2* 01/26/2013 0550   MCHC 32.4 01/26/2013 0550   RDW 16.2* 01/26/2013 0550   LYMPHSABS 2.2 09/11/2012 1618   MONOABS 0.6 09/11/2012 1618   EOSABS 0.2 09/11/2012 1618   BASOSABS 0.0 09/11/2012 1618     BNP    Component Value Date/Time   PROBNP 828.0* 01/26/2013 0550    Lipid Panel     Component Value Date/Time   CHOL 147 03/01/2009   TRIG 166 03/01/2009   HDL 33 03/01/2009   CHOLHDL 4.1 03/06/2008 0750   VLDL 20 03/06/2008 0750   LDLCALC 92 03/01/2009     RADIOLOGY: No results found.    ASSESSMENT AND PLAN: Krystal [name] has established coronary disease dating back to the 1990s and has undergone intervention with stenting to her proximal LAD, AV groove circumflex, mid and distal right coronary arteries. He has  been off verapamil and Bystolic of which were at higher doses when she developed transient heart block. Her resting pulse is now in the upper 80's. I'm suggesting a rechallenge with very low-dose Bystolic at 2.5 mg daily. She also has noticed some vague chest pain and does have mild concomitant CAD at her last catheterization. I will further titrate her isosorbide mononitrate to 60 mg from her present dose of 30 mg. Laboratory will be rechecked consisting of a CBC, CMP, lipid panel, thyroid function studies, as well as vitamin D level. I will see her back in the office in approximately 4 weeks for followup evaluation.     Lennette Bihari, MD, [facility]    08/09/2013 10:37 AM

## 2013-08-13 ENCOUNTER — Encounter: Payer: Self-pay | Admitting: Endocrinology

## 2013-08-13 ENCOUNTER — Ambulatory Visit (INDEPENDENT_AMBULATORY_CARE_PROVIDER_SITE_OTHER): Payer: Medicare Other | Admitting: Endocrinology

## 2013-08-13 ENCOUNTER — Encounter: Payer: Self-pay | Admitting: Cardiology

## 2013-08-13 ENCOUNTER — Ambulatory Visit (INDEPENDENT_AMBULATORY_CARE_PROVIDER_SITE_OTHER): Payer: Medicare Other | Admitting: Cardiovascular Disease

## 2013-08-13 ENCOUNTER — Other Ambulatory Visit (HOSPITAL_COMMUNITY): Payer: Self-pay | Admitting: Cardiovascular Disease

## 2013-08-13 VITALS — BP 150/92 | HR 80 | Ht 67.0 in | Wt 227.9 lb

## 2013-08-13 VITALS — BP 122/60 | HR 84 | Temp 98.3°F | Resp 14 | Ht 67.0 in | Wt 231.1 lb

## 2013-08-13 DIAGNOSIS — I119 Hypertensive heart disease without heart failure: Secondary | ICD-10-CM

## 2013-08-13 DIAGNOSIS — E049 Nontoxic goiter, unspecified: Secondary | ICD-10-CM | POA: Diagnosis not present

## 2013-08-13 DIAGNOSIS — I25810 Atherosclerosis of coronary artery bypass graft(s) without angina pectoris: Secondary | ICD-10-CM

## 2013-08-13 DIAGNOSIS — R600 Localized edema: Secondary | ICD-10-CM

## 2013-08-13 DIAGNOSIS — IMO0001 Reserved for inherently not codable concepts without codable children: Secondary | ICD-10-CM

## 2013-08-13 DIAGNOSIS — I2510 Atherosclerotic heart disease of native coronary artery without angina pectoris: Secondary | ICD-10-CM

## 2013-08-13 DIAGNOSIS — E785 Hyperlipidemia, unspecified: Secondary | ICD-10-CM | POA: Diagnosis not present

## 2013-08-13 DIAGNOSIS — R002 Palpitations: Secondary | ICD-10-CM | POA: Diagnosis not present

## 2013-08-13 DIAGNOSIS — R0789 Other chest pain: Secondary | ICD-10-CM

## 2013-08-13 DIAGNOSIS — G4730 Sleep apnea, unspecified: Secondary | ICD-10-CM | POA: Diagnosis not present

## 2013-08-13 DIAGNOSIS — E1129 Type 2 diabetes mellitus with other diabetic kidney complication: Secondary | ICD-10-CM | POA: Diagnosis not present

## 2013-08-13 DIAGNOSIS — R609 Edema, unspecified: Secondary | ICD-10-CM

## 2013-08-13 DIAGNOSIS — E669 Obesity, unspecified: Secondary | ICD-10-CM

## 2013-08-13 DIAGNOSIS — N184 Chronic kidney disease, stage 4 (severe): Secondary | ICD-10-CM | POA: Diagnosis not present

## 2013-08-13 LAB — COMPREHENSIVE METABOLIC PANEL
Alkaline Phosphatase: 70 U/L (ref 39–117)
BUN: 17 mg/dL (ref 6–23)
Glucose, Bld: 295 mg/dL — ABNORMAL HIGH (ref 70–99)
Sodium: 138 mEq/L (ref 135–145)
Total Bilirubin: 0.2 mg/dL — ABNORMAL LOW (ref 0.3–1.2)
Total Protein: 6.9 g/dL (ref 6.0–8.3)

## 2013-08-13 LAB — T4, FREE: Free T4: 0.87 ng/dL (ref 0.60–1.60)

## 2013-08-13 LAB — LIPID PANEL
Cholesterol: 192 mg/dL (ref 0–200)
Total CHOL/HDL Ratio: 4
Triglycerides: 365 mg/dL — ABNORMAL HIGH (ref 0.0–149.0)
VLDL: 73 mg/dL — ABNORMAL HIGH (ref 0.0–40.0)

## 2013-08-13 MED ORDER — NEBIVOLOL HCL 2.5 MG PO TABS: 2.5000 mg | ORAL_TABLET | Freq: Two times a day (BID) | ORAL | Status: DC

## 2013-08-13 MED ORDER — FUROSEMIDE 40 MG PO TABS: ORAL_TABLET | ORAL | Status: DC

## 2013-08-13 NOTE — Patient Instructions (Addendum)
GRAY TUBE Increase Lantus to 75 units INSTEAD OF 60  ORANGE Pen TAKE  50 AT SUPPER AND 25 AT LUNCH

## 2013-08-13 NOTE — Progress Notes (Signed)
Patient ID: Krystal [name], female   DOB: 1936-09-13, 77 y.o.   MRN: [id number]  Reason for Appointment: Diabetes follow-up   History of Present Illness   Diagnosis: date of diagnosis: 1999, Type 2 diabetes mellitus.   PAST history: She has had long-standing diabetes which had been initially treated with oral agents. She was started on premixed insulin possibly in 2005. She also has been on metformin, Januvia, Actos and glipizide in the past. Actos was stopped in 2009 when she had edema. Metformin was stopped because of renal dysfunction Overall her blood sugar control has been poor with A1c consistently over 7% and since about 2009 has been mostly over 10%. INSULIN doses have been progressively increased. However her evening dose has been variable because of the tendency to hypoglycemia overnight; was taking pm dose hs. She had been continued on premixed insulin because of simplicity of dosing.   RECENT history: Because of poor control with premixed insulin and difficulty with consistent compliance with midday insulin she was switched to a basal bolus regimen with Lantus and NovoLog. Appears that initially the readings were better with this regimen but they are persistently high. She is  getting better compliant with insulin at mealtimes and afternoon blood sugars are better but she has not taken the doses of Lantus and not clear how much he is taking at suppertime. Because of high readings at bedtime she may take 15 units additional NovoLog at bedtime She has been to the nurse educator also for education a couple of times She has been taking Victoza as directed since her last visit   Current insulin dose: LANTUS insulin 60 am.; NOVOLOG 30 AT BREAKFAST, 20 AT LUNCH AND 35 AT SUPPER, 15 hs at times. Takes ac or just pc  Complications: nephropathy.  Side effects from medications: Edema.  Proper timing of medications in relation to meals: Not consistently, Takes her insulin at breakfast in am but not  always before supper.  Monitors blood glucose: Twice a day.  Glucometer: Freestyle.  Blood Glucose readings: am: Average about 260, range 122-425 with only one reading below 170. Midday and evening mostly over 200 with sporadic good readings at 3-4 PM. Have his blood sugar and late night about 320 and overnight range 195-340. Overall average 263 as of 08/13/13  Hypoglycemia frequency: none  Meals: 3 meals per day with breakfast at 10 am; usually minimal lunch; dinner 5-6 pm. The diet that the patient has been following is none, does not always restrict fat intake and will try to eat sugar-free desserts, eating out about twice a week.  Physical activity: exercise: none, gets short of breath on activity.  Dietician visit: 5/14.  CDE 6/14 Retinal exam: Most recent:12/13.   The last HbgA1c was 9.7 in 5/14, was 10.1 % on 12/24/12. Lab Results  Component Value Date   HGBA1C 11.6* 08/13/2013   The microalbumin has been tested , and the result was 860.   Hyperlipidemia:   Most recent lipids showed LDL measured: 98, HDL: 37. Currently on Crestor 40 mg and Zetia.     Medication List       This list is accurate as of: 08/13/13  2:41 PM.  Always use your most recent med list.               ADVAIR DISKUS 250-50 MCG/DOSE Aepb  Generic drug:  Fluticasone-Salmeterol  Inhale 1 puff into the lungs 2 (two) times daily.     amLODipine 5 MG tablet  Commonly  known as:  NORVASC  Take 1 tablet (5 mg total) by mouth 2 (two) times daily.     aspirin 81 MG EC tablet  Take 81 mg by mouth every morning.     budesonide-formoterol 160-4.5 MCG/ACT inhaler  Commonly known as:  SYMBICORT  Inhale 2 puffs into the lungs 2 (two) times daily as needed (for shortness of breath).     calcitRIOL 0.25 MCG capsule  Commonly known as:  ROCALTROL     clopidogrel 75 MG tablet  Commonly known as:  PLAVIX  Take 75 mg by mouth every morning.     ezetimibe 10 MG tablet  Commonly known as:  ZETIA  Take 10 mg by  mouth every morning.     famotidine 20 MG tablet  Commonly known as:  PEPCID  Take 1 tablet (20 mg total) by mouth 2 (two) times daily.     ferrous sulfate 325 (65 FE) MG tablet  Take 325 mg by mouth daily.     furosemide 40 MG tablet  Commonly known as:  LASIX  Take 1 tablet in the AM and 1/2 tablet in the PM.     HYDROcodone-acetaminophen 5-500 MG per tablet  Commonly known as:  VICODIN     insulin aspart 100 UNIT/ML injection  Commonly known as:  novoLOG  Inject 30 Units into the skin 3 (three) times daily with meals. Take 25 units @ breakfast and lunch and 30 units @supper .     insulin glargine 100 UNIT/ML injection  Commonly known as:  LANTUS  Inject 65 Units into the skin at bedtime.     isosorbide mononitrate 60 MG 24 hr tablet  Commonly known as:  IMDUR  TAKE 1 TABLET ONCE DAILY.     loratadine 10 MG tablet  Commonly known as:  CLARITIN  Take 10 mg by mouth daily as needed for allergies.     montelukast 10 MG tablet  Commonly known as:  SINGULAIR     nebivolol 2.5 MG tablet  Commonly known as:  BYSTOLIC  Take 1 tablet (2.5 mg total) by mouth 2 (two) times daily.     NITROSTAT 0.4 MG SL tablet  Generic drug:  nitroGLYCERIN  Take 1 tablet by mouth every 5 (five) minutes x 3 doses as needed. For chest pains     potassium chloride 10 MEQ tablet  Commonly known as:  K-DUR     QC PEN NEEDLES 31G X 6 MM Misc  Generic drug:  Insulin Pen Needle     RANEXA 1000 MG SR tablet  Generic drug:  ranolazine  Take 1,000 mg by mouth 2 (two) times daily.     rosuvastatin 20 MG tablet  Commonly known as:  CRESTOR  Take 20 mg by mouth every evening.        Allergies:  Allergies  Allergen Reactions  . Morphine Shortness Of Breath and Swelling  . Penicillins Shortness Of Breath and Swelling    Past Medical History  Diagnosis Date  . Sleep apnea     on C-pap  . Occult blood in stools   . Myalgia   . Abnormal PFT   . DM (diabetes mellitus)     type II  uncontrolled  . Palpitations   . Microalbuminuria   . Microcytic anemia   . DJD (degenerative joint disease)   . Constipation   . CAD (coronary artery disease) 2009    5 stents- #3 in RCA, #1 each in LAD and AVG  . Osteoarthritis   .  Low back pain   . HTN (hypertension)   . Hyperlipidemia   . GERD (gastroesophageal reflux disease)   . Allergic rhinitis   . Personal history of colonic polyps   . HOH (hard of hearing)   . MI (myocardial infarction) 98  . Glaucoma   . Diastolic dysfunction, left ventricle 09/12/12  . Cancer     uterine  . Acute renal failure due to procedure 09/18/2012  . CKD (chronic kidney disease) stage 3, GFR 30-59 ml/min 09/18/2012  . Heart block 02/2013    transient    Past Surgical History  Procedure Laterality Date  . Ectopic pregnancy surgery    . Foot surgery      left and right for callous  . Cervical discectomy      L5 left/hemilminectomy  . Colonoscopy  09/2006    int hemmorhoids, COMPLICATED BY CARDIOPULMONARY COMPLICATIONS  . Abdominal hysterectomy      uterine cancer  . Polypectomy  10/22/2011    internal hemorrhoids/sessile polyp  . Coronary angioplasty  1/99, 1/07, 1/08, 4/09    5 cardiac stents total    Family History  Problem Relation Age of Onset  . Anesthesia problems Neg Hx   . Hypotension Neg Hx   . Malignant hyperthermia Neg Hx   . Pseudochol deficiency Neg Hx     Social History:  reports that she quit smoking about 6 years ago. Her smoking use included Cigarettes. She smoked 0.40 packs per day. She does not have any smokeless tobacco history on file. She reports that she does not drink alcohol or use illicit drugs.  Review of Systems  Cardiovascular ROS: She has a history of CAD Also has history of asthma/COPD She has a history of small goiter   Examination:   BP 122/60  Pulse 84  Temp(Src) 98.3 F (36.8 C)  Resp 14  Ht 5\' 7"  (1.702 m)  Wt 231 lb 1.6 oz (104.826 kg)  BMI 36.19 kg/m2  SpO2 95%  Body mass index  is 36.19 kg/(m^2).   No edema  Assesment:   Diabetes type 2, uncontrolled - 250.02  The patient's diabetes control appears to be overall still poorly controlled  with home readings mostly over 200. Has a few better readings in the afternoon with improving her compliance with the lunchtime dose She is unsure about what insulin doses she is taking and probably did not increase her Lantus as directed to 75 units Fasting readings are still mostly over 200 Her highest readings appear to be after supper again and she has taken correction dose of 10 units at bedtime which helps overnight readings a little Discussed need for further improving compliance with all insulin doses especially at meals and she can carry the insulin pen with her when eating out Also having difficulty with weight gain despite restarting Victoza  HYPERLIPIDEMIA: Triglycerides are significantly high, previously normal, may improve with better diabetes control and weight loss  PLAN:  Increase Lantus to 75 units and keep mealtime doses at breakfast and lunch unchanged for now She will also clarify what exactly she is taking at home since she is not sure about the doses of insulin at present Since the glucose is highest at bedtime will increase the suppertime dose to 50 She will call if blood sugars are not improving or if she is having any low sugars Adjust insulin further on followup visit Consider adding Invokana on the next visit   Tawna Alwin 08/13/2013, 2:41 PM   9/29, Addendum: Patient  was called and insulin instructions were confirmed, she has also started taking 40 units NovoLog at breakfast and lunch, has been compliant with Victoza

## 2013-08-13 NOTE — Patient Instructions (Addendum)
Your physician has recommended you make the following change in your medication: Increase the furosemide to 40mg  in the morning and 20 mg in the afternoon. Change the bystolic to 2.5 mg twice daily.  Your physician has requested that you have a lexiscan myoview. For further information please visit [email or web address] Please follow instruction sheet, as given.  Your physician recommends that you schedule a follow-up appointment in: 4 WEEKS

## 2013-08-14 ENCOUNTER — Ambulatory Visit (HOSPITAL_COMMUNITY)
Admission: RE | Admit: 2013-08-14 | Discharge: 2013-08-14 | Disposition: A | Discharge status: Home / Self Care | Payer: Medicare Other | Source: Ambulatory Visit | Attending: Cardiovascular Disease | Admitting: Cardiovascular Disease

## 2013-08-14 DIAGNOSIS — I252 Old myocardial infarction: Secondary | ICD-10-CM

## 2013-08-14 DIAGNOSIS — I2510 Atherosclerotic heart disease of native coronary artery without angina pectoris: Secondary | ICD-10-CM | POA: Diagnosis not present

## 2013-08-14 DIAGNOSIS — I25810 Atherosclerosis of coronary artery bypass graft(s) without angina pectoris: Secondary | ICD-10-CM | POA: Diagnosis not present

## 2013-08-14 LAB — MICROALBUMIN / CREATININE URINE RATIO
Creatinine,U: 74 mg/dL
Microalb Creat Ratio: 98.8 mg/g — ABNORMAL HIGH (ref 0.0–30.0)
Microalb, Ur: 73.1 mg/dL — ABNORMAL HIGH (ref 0.0–1.9)

## 2013-08-14 MED ORDER — TECHNETIUM TC 99M SESTAMIBI GENERIC - CARDIOLITE
10.6000 | Freq: Once | INTRAVENOUS | Status: AC | PRN
  Administered 2013-08-14: 11 via INTRAVENOUS

## 2013-08-14 MED ORDER — AMINOPHYLLINE 25 MG/ML IV SOLN
125.0000 mg | Freq: Once | INTRAVENOUS | Status: AC
  Administered 2013-08-14: 125 mg via INTRAVENOUS

## 2013-08-14 MED ORDER — REGADENOSON 0.4 MG/5ML IV SOLN
0.4000 mg | Freq: Once | INTRAVENOUS | Status: AC
  Administered 2013-08-14: 0.4 mg via INTRAVENOUS

## 2013-08-14 MED ORDER — TECHNETIUM TC 99M SESTAMIBI GENERIC - CARDIOLITE
31.9000 | Freq: Once | INTRAVENOUS | Status: AC | PRN
  Administered 2013-08-14: 31.9 via INTRAVENOUS

## 2013-08-14 NOTE — Procedures (Addendum)
[facility] [address] [place] [address] [phone]  Cardiology Nuclear Med Study  Krystal [name] is a 77 y.o. female     MRN : [id number]     DOB: [birth year]  Procedure Date: 08/14/2013  Nuclear Med Background Indication for Stress Test:  Evaluation for Ischemia and Stent Patency History:  COPD and CAD;MI-1998 AND 10/29/2006;STENT/PTCA--1995,12/2005,11/2006;CHF;ARF;CHB;DJD Cardiac Risk Factors: History of Smoking, Hypertension, IDDM Type 2, Lipids, Obesity and PULMONARY HYPERTENSION;HYPERKALEMIA  Symptoms:  Chest Pain, Dizziness, Fatigue, Light-Headedness, Palpitations and SOB   Nuclear Pre-Procedure Caffeine/Decaff Intake:  12:00am NPO After: 10AM   IV Site: R Antecubital  IV 0.9% NS with Angio Cath:  22g  Chest Size (in):  N/A IV Started by: Emmit Pomfret, RN  Height: 5\' 7"  (1.702 m)  Cup Size: C  BMI:  Body mass index is 35.55 kg/(m^2). Weight:  227 lb (102.967 kg)   Tech Comments:  N/A    Nuclear Med Study 1 or 2 day study: 1 day  Stress Test Type:  Lexiscan  Order Authorizing Provider:  Nicki Guadalajara, MD   Resting Radionuclide: Technetium 91m Sestamibi  Resting Radionuclide Dose: 10.6 mCi   Stress Radionuclide:  Technetium 45m Sestamibi  Stress Radionuclide Dose: 31.9 mCi           Stress Protocol Rest HR:79 Stress HR:91  Rest BP: 152/72 Stress BP: 162/65  Exercise Time (min): n/a METS: n/a          Dose of Adenosine (mg):  n/a Dose of Lexiscan: 0.4 mg  Dose of Atropine (mg): n/a Dose of Dobutamine: n/a mcg/kg/min (at max HR)  Stress Test Technologist: Ernestene Mention, CCT Nuclear Technologist: Gonzella Lex, CNMT   Rest Procedure:  Myocardial perfusion imaging was performed at rest 45 minutes following the intravenous administration of Technetium 12m Sestamibi. Stress Procedure:  The patient received IV Lexiscan 0.4 mg over 15-seconds.  Technetium 3m Sestamibi injected at 30-seconds.  Due to  patient's extreme shortness of breath, she was given IV Aminophylline 125 mg. Symptom was resolved during recovery. There were no significant changes with Lexiscan.  Quantitative spect images were obtained after a 45 minute delay.  Transient Ischemic Dilatation (Normal <1.22):  1.03 Lung/Heart Ratio (Normal <0.45):  0.20 QGS EDV:  62 ml QGS ESV:  24 ml LV Ejection Fraction: 62%  Signed  Gonzella Lex, CNMT  Rest ECG: NSR with non-specific ST-T wave changes  Stress ECG: No significant change from baseline ECG and No significant ST segment change suggestive of ischemia.  QPS Raw Data Images:  Mild breast attenuation.  Normal left ventricular size. Stress Images:  There is decreased uptake in the apex.  There is no evidence of partial reversibility in this area. Rest Images:  There is decreased uptake in the apex. Subtraction (SDS):  There is a fixed anteroapical defect that is most consistent with breast attenuation.  There is no evidence of scar or ischemia.   Impression Exercise Capacity:  Lexiscan with no exercise. BP Response:  Normal blood pressure response. Clinical Symptoms:  There is dyspnea. ECG Impression:  No significant ECG changes with Lexiscan. Comparison with Prior Nuclear Study: No images to compare LV Wall Motion:  NL LV Function; NL Wall Motion   Overall Impression:  Normal stress nuclear study. and Low risk stress nuclear study with mild apical breast attenuation.    Marykay Lex, MD  08/14/2013 6:29 PM

## 2013-08-15 ENCOUNTER — Encounter: Payer: Self-pay | Admitting: Cardiovascular Disease

## 2013-08-15 DIAGNOSIS — R600 Localized edema: Secondary | ICD-10-CM | POA: Insufficient documentation

## 2013-08-15 DIAGNOSIS — R0789 Other chest pain: Secondary | ICD-10-CM | POA: Insufficient documentation

## 2013-08-15 NOTE — Progress Notes (Signed)
Patient ID: Krystal [name], female   DOB: [birth year], 77 y.o.   MRN: [id number]     HPI: Krystal [name], is a 77 y.o. female who presents to the office for a followup cardiology evaluation.  Krystal [name] has a history of hypertension, type 2 diabetes mellitus, obstructive sleep apnea on CPAP therapy, and has documented diastolic dysfunction. She has coronary artery disease dating back to the 90s and in the past had undergone multiple stent procedures with stents located in the proximal LAD,  circumflex, mid right coronary artery, and distal right coronary artery. Her last catheterization was done in October 2013 by Dr Royann Shivers and she was not found to have high-grade restenosis. She did have 80-90% ostial stenosis and a small jailed first septal perforating artery. She had mild calcification at 2030% narrowing in the stent in the circumflex vessel. The right coronary artery had 3 stents and there was 40-50% in-stent smooth narrowing with 30% narrowing in the distal stent. Ejection fraction was 65%. She did have moderate pulmonary hypertension with a PA pressure 47/20 was found to have acute on chronic diastolic left ventricular failure. Recently, she has noticed weakness at times and dizziness. She also feels at times her heart "jumps ."  She does note some occasional episodes of chest tightness. She is on oxygen 2 L in the past, while she was on high-dose verapamil and Bystolic she did develop transient heart block and these medicines were ultimately discontinued. When I saw her approximately 3-4 weeks ago I did recommend a rechallenge of very low-dose Bystolic at 2.5 mg daily. At that time her resting pulse was in the. She has experienced some episodes of chest discomfort. Presently she admits to fatigue, shortness of breath, swelling of her legs and feet intermittently, as well as episodes of chest tightness.  Past Medical History  Diagnosis Date  . Sleep apnea     on C-pap  . Occult blood in  stools   . Myalgia   . Abnormal PFT   . DM (diabetes mellitus)     type II uncontrolled  . Palpitations   . Microalbuminuria   . Microcytic anemia   . DJD (degenerative joint disease)   . Constipation   . CAD (coronary artery disease) 2009    5 stents- #3 in RCA, #1 each in LAD and AVG  . Osteoarthritis   . Low back pain   . HTN (hypertension)   . Hyperlipidemia   . GERD (gastroesophageal reflux disease)   . Allergic rhinitis   . Personal history of colonic polyps   . HOH (hard of hearing)   . MI (myocardial infarction) 98  . Glaucoma   . Diastolic dysfunction, left ventricle 09/12/12  . Cancer     uterine  . Acute renal failure due to procedure 09/18/2012  . CKD (chronic kidney disease) stage 3, GFR 30-59 ml/min 09/18/2012  . Heart block 02/2013    transient    Past Surgical History  Procedure Laterality Date  . Ectopic pregnancy surgery    . Foot surgery      left and right for callous  . Cervical discectomy      L5 left/hemilminectomy  . Colonoscopy  09/2006    int hemmorhoids, COMPLICATED BY CARDIOPULMONARY COMPLICATIONS  . Abdominal hysterectomy      uterine cancer  . Polypectomy  10/22/2011    internal hemorrhoids/sessile polyp  . Coronary angioplasty  1/99, 1/07, 1/08, 4/09    5 cardiac stents total  Allergies  Allergen Reactions  . Morphine Shortness Of Breath and Swelling  . Penicillins Shortness Of Breath and Swelling    Current Outpatient Prescriptions  Medication Sig Dispense Refill  . amLODipine (NORVASC) 5 MG tablet Take 1 tablet (5 mg total) by mouth 2 (two) times daily.  60 tablet  5  . aspirin 81 MG EC tablet Take 81 mg by mouth every morning.       . budesonide-formoterol (SYMBICORT) 160-4.5 MCG/ACT inhaler Inhale 2 puffs into the lungs 2 (two) times daily as needed (for shortness of breath).      . calcitRIOL (ROCALTROL) 0.25 MCG capsule       . clopidogrel (PLAVIX) 75 MG tablet Take 75 mg by mouth every morning.       . ezetimibe  (ZETIA) 10 MG tablet Take 10 mg by mouth every morning.       . famotidine (PEPCID) 20 MG tablet Take 1 tablet (20 mg total) by mouth 2 (two) times daily.  60 tablet  9  . ferrous sulfate 325 (65 FE) MG tablet Take 325 mg by mouth daily.       . Fluticasone-Salmeterol (ADVAIR DISKUS) 250-50 MCG/DOSE AEPB Inhale 1 puff into the lungs 2 (two) times daily.       . furosemide (LASIX) 40 MG tablet Take 1 tablet in the AM and 1/2 tablet in the PM.  45 tablet  6  . HYDROcodone-acetaminophen (VICODIN) 5-500 MG per tablet       . insulin aspart (NOVOLOG) 100 UNIT/ML injection Inject 30 Units into the skin 3 (three) times daily with meals. Take 25 units @ breakfast and lunch and 30 units @supper .      . insulin glargine (LANTUS) 100 UNIT/ML injection Inject 65 Units into the skin at bedtime.       . isosorbide mononitrate (IMDUR) 60 MG 24 hr tablet TAKE 1 TABLET ONCE DAILY.  30 tablet  6  . loratadine (CLARITIN) 10 MG tablet Take 10 mg by mouth daily as needed for allergies.      . montelukast (SINGULAIR) 10 MG tablet       . nebivolol (BYSTOLIC) 2.5 MG tablet Take 1 tablet (2.5 mg total) by mouth 2 (two) times daily.  60 tablet  6  . NITROSTAT 0.4 MG SL tablet Take 1 tablet by mouth every 5 (five) minutes x 3 doses as needed. For chest pains      . potassium chloride (K-DUR) 10 MEQ tablet       . QC PEN NEEDLES 31G X 6 MM MISC       . ranolazine (RANEXA) 1000 MG SR tablet Take 1,000 mg by mouth 2 (two) times daily.      . rosuvastatin (CRESTOR) 20 MG tablet Take 20 mg by mouth every evening.       No current facility-administered medications for this visit.    History   Social History  . Marital Status: Widowed    Spouse Name: N/A    Number of Children: N/A  . Years of Education: N/A   Occupational History  . Not on file.   Social History Main Topics  . Smoking status: Former Smoker -- 0.40 packs/day    Types: Cigarettes    Quit date: 10/18/2006  . Smokeless tobacco: Not on file      Comment: quit about 4 yrs ago  . Alcohol Use: No  . Drug Use: No  . Sexual Activity: Yes    Birth Control/ Protection: Surgical  Other Topics Concern  . Not on file   Social History Narrative  . No narrative on file    Family History  Problem Relation Age of Onset  . Anesthesia problems Neg Hx   . Hypotension Neg Hx   . Malignant hyperthermia Neg Hx   . Pseudochol deficiency Neg Hx     ROS is negative for fevers, chills or night sweats.  She does note weakness. At times there is some transient dizziness. She has noticed that her pulse is not quite as fast restarting low-dose Bystolic.  She denies any syncope. She still experiences vague chest tightness. She denies bleeding. She is on 2 L of oxygen supplement during the day and uses her CPAP therapy with oxygen nocturnally. She complains of ankle swelling. She denies paresthesias Other system review is negative.  PE BP 150/92  Pulse 80  Ht 5\' 7"  (1.702 m)  Wt 227 lb 14.4 oz (103.375 kg)  BMI 35.69 kg/m2  General: Alert, oriented, no distress.  Skin: normal turgor, no rashes HEENT: Normocephalic, atraumatic. Pupils round and reactive; sclera anicteric;no lid lag.  Nose without nasal septal hypertrophy Mouth/Parynx benign; Mallinpatti scale 3/4 Neck: No JVD, no carotid briuts Lungs: clear to ausculatation and percussion; no wheezing or rales Heart: RRR, s1 s2 normal 1/6 systolic murmur Abdomen: Central adiposity soft, nontender; no hepatosplenomehaly, BS+; abdominal aorta nontender and not dilated by palpation. Pulses 2+ Extremities: Bilateral ankle swelling ; no clubbing cyanosis, Homan's sign negative  Neurologic: grossly nonfocal  ECG: Sinus rhythm at 88 beats per minute. Nonspecific T changes.  LABS:  BMET    Component Value Date/Time   NA 138 08/13/2013 1502   K 4.0 08/13/2013 1502   CL 100 08/13/2013 1502   CO2 32 08/13/2013 1502   GLUCOSE 295* 08/13/2013 1502   BUN 17 08/13/2013 1502   CREATININE 1.5* 08/13/2013  1502   CALCIUM 9.4 08/13/2013 1502   GFRNONAA 19* 01/26/2013 0550   GFRAA 22* 01/26/2013 0550     Hepatic Function Panel     Component Value Date/Time   PROT 6.9 08/13/2013 1502   ALBUMIN 3.6 08/13/2013 1502   AST 19 08/13/2013 1502   ALT 19 08/13/2013 1502   ALKPHOS 70 08/13/2013 1502   BILITOT 0.2* 08/13/2013 1502   BILIDIR <0.1 01/25/2013 2140   IBILI NOT CALCULATED 01/25/2013 2140     CBC    Component Value Date/Time   WBC 7.7 01/26/2013 0550   RBC 4.55 01/26/2013 0550   HGB 11.0* 01/26/2013 0550   HCT 33.9* 01/26/2013 0550   PLT 203 01/26/2013 0550   MCV 74.5* 01/26/2013 0550   MCH 24.2* 01/26/2013 0550   MCHC 32.4 01/26/2013 0550   RDW 16.2* 01/26/2013 0550   LYMPHSABS 2.2 09/11/2012 1618   MONOABS 0.6 09/11/2012 1618   EOSABS 0.2 09/11/2012 1618   BASOSABS 0.0 09/11/2012 1618     BNP    Component Value Date/Time   PROBNP 828.0* 01/26/2013 0550    Lipid Panel     Component Value Date/Time   CHOL 192 08/13/2013 1502   TRIG 365.0* 08/13/2013 1502   HDL 43.60 08/13/2013 1502   CHOLHDL 4 08/13/2013 1502   VLDL 73.0* 08/13/2013 1502   LDLCALC 92 03/01/2009     RADIOLOGY: No results found.    ASSESSMENT AND PLAN: Krystal [name] has established coronary disease dating back to the 1990s and has undergone intervention with stenting to her proximal LAD, AV groove circumflex, mid and distal right coronary  arteries. Remotely, she had developed transient heart block when she was on high-dose verapamil and Bystolic. When I saw her approximately one month ago, her resting pulse was fast that she was experiencing some episodes of chest pain. She has tolerated the reinstitution of low-dose Bystolic. I'm not recommending she increase this to 2.5 mg twice a day she continues to have leg swelling. I'm increasing her Lasix to 40 mg in the morning and to take an additional 20 mg in the late afternoon. In light of her recurrent episodes of chest tightness I am scheduling her for an LexiScan  Myoview study. I will see her back in the office in 4 weeks for followup evaluation  Lennette Bihari, MD, [facility]  08/15/2013 7:03 PM

## 2013-08-17 ENCOUNTER — Encounter: Payer: Self-pay | Admitting: *Deleted

## 2013-08-17 NOTE — Progress Notes (Signed)
Quick Note:  nuc result note sent to patient. ______

## 2013-08-17 NOTE — Progress Notes (Signed)
Quick Note:  NUC result note sent to patient. ______

## 2013-08-18 ENCOUNTER — Ambulatory Visit: Payer: Medicare Other | Admitting: Cardiovascular Disease

## 2013-08-31 ENCOUNTER — Encounter: Payer: Self-pay | Admitting: Cardiovascular Disease

## 2013-09-01 ENCOUNTER — Ambulatory Visit: Payer: Medicare Other | Admitting: Cardiovascular Disease

## 2013-09-04 DIAGNOSIS — E119 Type 2 diabetes mellitus without complications: Secondary | ICD-10-CM | POA: Diagnosis not present

## 2013-09-04 DIAGNOSIS — J309 Allergic rhinitis, unspecified: Secondary | ICD-10-CM | POA: Diagnosis not present

## 2013-09-04 DIAGNOSIS — R609 Edema, unspecified: Secondary | ICD-10-CM | POA: Diagnosis not present

## 2013-09-08 DIAGNOSIS — L608 Other nail disorders: Secondary | ICD-10-CM | POA: Diagnosis not present

## 2013-09-08 DIAGNOSIS — B351 Tinea unguium: Secondary | ICD-10-CM | POA: Diagnosis not present

## 2013-09-08 DIAGNOSIS — L84 Corns and callosities: Secondary | ICD-10-CM | POA: Diagnosis not present

## 2013-09-08 DIAGNOSIS — M79609 Pain in unspecified limb: Secondary | ICD-10-CM | POA: Diagnosis not present

## 2013-09-08 DIAGNOSIS — I739 Peripheral vascular disease, unspecified: Secondary | ICD-10-CM | POA: Diagnosis not present

## 2013-09-08 DIAGNOSIS — E1059 Type 1 diabetes mellitus with other circulatory complications: Secondary | ICD-10-CM | POA: Diagnosis not present

## 2013-09-10 ENCOUNTER — Ambulatory Visit (INDEPENDENT_AMBULATORY_CARE_PROVIDER_SITE_OTHER): Payer: Medicare Other | Admitting: Cardiovascular Disease

## 2013-09-10 ENCOUNTER — Encounter: Payer: Self-pay | Admitting: Cardiovascular Disease

## 2013-09-10 VITALS — BP 162/90 | HR 88 | Ht 67.0 in | Wt 229.3 lb

## 2013-09-10 DIAGNOSIS — I2510 Atherosclerotic heart disease of native coronary artery without angina pectoris: Secondary | ICD-10-CM

## 2013-09-10 DIAGNOSIS — E785 Hyperlipidemia, unspecified: Secondary | ICD-10-CM

## 2013-09-10 DIAGNOSIS — I5033 Acute on chronic diastolic (congestive) heart failure: Secondary | ICD-10-CM | POA: Diagnosis not present

## 2013-09-10 DIAGNOSIS — J449 Chronic obstructive pulmonary disease, unspecified: Secondary | ICD-10-CM

## 2013-09-10 DIAGNOSIS — E669 Obesity, unspecified: Secondary | ICD-10-CM

## 2013-09-10 DIAGNOSIS — I10 Essential (primary) hypertension: Secondary | ICD-10-CM | POA: Diagnosis not present

## 2013-09-10 DIAGNOSIS — I2789 Other specified pulmonary heart diseases: Secondary | ICD-10-CM

## 2013-09-10 MED ORDER — NEBIVOLOL HCL 5 MG PO TABS: ORAL_TABLET | ORAL | Status: DC

## 2013-09-10 MED ORDER — AMLODIPINE BESYLATE 5 MG PO TABS: 5.0000 mg | ORAL_TABLET | Freq: Every day | ORAL | Status: DC

## 2013-09-10 MED ORDER — SPIRONOLACTONE 25 MG PO TABS: ORAL_TABLET | ORAL | Status: DC

## 2013-09-10 NOTE — Progress Notes (Signed)
Patient ID: Krystal [name], female   DOB: 14-Feb-1936, 77 y.o.   MRN: [id number]      HPI: Krystal [name] is a 77 y.o. female who presents to the office for a followup cardiology evaluation.  Ms. Coto has a history of hypertension, type 2 diabetes mellitus, obstructive sleep apnea on CPAP therapy, and has documented diastolic dysfunction. She has coronary artery disease dating back to the 1990s and in the past had undergone multiple stent procedures involving the proximal LAD,  circumflex, mid right coronary artery, and distal right coronary artery. Her last catheterization in October 2013 by Dr Royann Shivers did not reveal high-grade restenosis. She did have 80-90% ostial stenosis and a small jailed first septal perforating artery. She had mild calcification at 20-30% narrowing in the stent in the circumflex vessel. The right coronary artery had 3 stents and there was 40-50% in-stent smooth narrowing with 30% narrowing in the distal stent. Ejection fraction was 65%. She did have moderate pulmonary hypertension with a PA pressure 47/20 was found to have acute on chronic diastolic left ventricular failure. Recently, she has noticed weakness at times and dizziness. She also feels at times her heart "jumps ."  She does note some occasional episodes of chest tightness. She is on oxygen 2 L.  Remotely while she was on high-dose verapamil and Bystolic she did develop transient heart block and these medicines were ultimately discontinued. When I saw her approximately 8 weeks ago I did recommend a rechallenge of very low-dose Bystolic at 2.5 mg daily which she tolerated and when I last saw her I further titrated his to 2.5 mg twice a day.   She underwent a nuclear perfusion study on 08/14/2013 which was low risk and showed mild breast attenuation but otherwise normal perfusion. Post-rest ejection fraction was 62%.   Ms. Severin continues to note intermittent leg swelling. She continues to experience shortness  of breath with activity and fatigue. She is sleeping with CPAP.  Past Medical History  Diagnosis Date  . Sleep apnea     on C-pap  . Occult blood in stools   . Myalgia   . Abnormal PFT   . DM (diabetes mellitus)     type II uncontrolled  . Palpitations   . Microalbuminuria   . Microcytic anemia   . DJD (degenerative joint disease)   . Constipation   . CAD (coronary artery disease) 2009    5 stents- #3 in RCA, #1 each in LAD and AVG  . Osteoarthritis   . Low back pain   . HTN (hypertension)   . Hyperlipidemia   . GERD (gastroesophageal reflux disease)   . Allergic rhinitis   . Personal history of colonic polyps   . HOH (hard of hearing)   . MI (myocardial infarction) 98  . Glaucoma   . Diastolic dysfunction, left ventricle 09/12/12  . Cancer     uterine  . Acute renal failure due to procedure 09/18/2012  . CKD (chronic kidney disease) stage 3, GFR 30-59 ml/min 09/18/2012  . Heart block 02/2013    transient    Past Surgical History  Procedure Laterality Date  . Ectopic pregnancy surgery    . Foot surgery      left and right for callous  . Cervical discectomy      L5 left/hemilminectomy  . Colonoscopy  09/2006    int hemmorhoids, COMPLICATED BY CARDIOPULMONARY COMPLICATIONS  . Abdominal hysterectomy      uterine cancer  . Polypectomy  10/22/2011  internal hemorrhoids/sessile polyp  . Coronary angioplasty  1/99, 1/07, 1/08, 4/09    5 cardiac stents total    Allergies  Allergen Reactions  . Morphine Shortness Of Breath and Swelling  . Penicillins Shortness Of Breath and Swelling    Current Outpatient Prescriptions  Medication Sig Dispense Refill  . amLODipine (NORVASC) 5 MG tablet Take 1 tablet (5 mg total) by mouth 2 (two) times daily.  60 tablet  5  . aspirin 81 MG EC tablet Take 81 mg by mouth every morning.       . budesonide-formoterol (SYMBICORT) 160-4.5 MCG/ACT inhaler Inhale 2 puffs into the lungs 2 (two) times daily as needed (for shortness of  breath).      . calcitRIOL (ROCALTROL) 0.25 MCG capsule       . clopidogrel (PLAVIX) 75 MG tablet Take 75 mg by mouth every morning.       . ezetimibe (ZETIA) 10 MG tablet Take 10 mg by mouth every morning.       . famotidine (PEPCID) 20 MG tablet Take 1 tablet (20 mg total) by mouth 2 (two) times daily.  60 tablet  9  . ferrous sulfate 325 (65 FE) MG tablet Take 325 mg by mouth daily.       . Fluticasone-Salmeterol (ADVAIR DISKUS) 250-50 MCG/DOSE AEPB Inhale 1 puff into the lungs 2 (two) times daily.       . furosemide (LASIX) 40 MG tablet Take 1 tablet in the AM and 1/2 tablet in the PM.  45 tablet  6  . HYDROcodone-acetaminophen (VICODIN) 5-500 MG per tablet       . insulin aspart (NOVOLOG) 100 UNIT/ML injection Inject 30 Units into the skin 3 (three) times daily with meals. Take 25 units @ breakfast and lunch and 30 units @supper .      . insulin glargine (LANTUS) 100 UNIT/ML injection Inject 65 Units into the skin at bedtime.       . isosorbide mononitrate (IMDUR) 60 MG 24 hr tablet TAKE 1 TABLET ONCE DAILY.  30 tablet  6  . loratadine (CLARITIN) 10 MG tablet Take 10 mg by mouth daily as needed for allergies.      . montelukast (SINGULAIR) 10 MG tablet       . nebivolol (BYSTOLIC) 2.5 MG tablet Take 1 tablet (2.5 mg total) by mouth 2 (two) times daily.  60 tablet  6  . NITROSTAT 0.4 MG SL tablet Take 1 tablet by mouth every 5 (five) minutes x 3 doses as needed. For chest pains      . potassium chloride (K-DUR) 10 MEQ tablet       . QC PEN NEEDLES 31G X 6 MM MISC       . ranolazine (RANEXA) 1000 MG SR tablet Take 1,000 mg by mouth 2 (two) times daily.      . rosuvastatin (CRESTOR) 20 MG tablet Take 20 mg by mouth every evening.       No current facility-administered medications for this visit.    History   Social History  . Marital Status: Widowed    Spouse Name: N/A    Number of Children: N/A  . Years of Education: N/A   Occupational History  . Not on file.   Social History  Main Topics  . Smoking status: Former Smoker -- 0.40 packs/day    Types: Cigarettes    Quit date: 10/18/2006  . Smokeless tobacco: Not on file     Comment: quit about 4 yrs ago  .  Alcohol Use: No  . Drug Use: No  . Sexual Activity: Yes    Birth Control/ Protection: Surgical   Other Topics Concern  . Not on file   Social History Narrative  . No narrative on file    Family History  Problem Relation Age of Onset  . Anesthesia problems Neg Hx   . Hypotension Neg Hx   . Malignant hyperthermia Neg Hx   . Pseudochol deficiency Neg Hx     ROS is negative for fevers, chills or night sweats.  She does note weakness. At times there is some transient dizziness. She has noticed that her pulse is not quite as fast restarting low-dose Bystolic.  She denies any syncope. She still experiences vague chest tightness. She denies bleeding. She is on 2 L of oxygen supplement during the day and uses her CPAP therapy with oxygen nocturnally. She complains of ankle swelling. She denies paresthesias Other system review is negative.  PE BP 162/90  Pulse 88  Ht 5\' 7"  (1.702 m)  Wt 229 lb 4.8 oz (104.01 kg)  BMI 35.91 kg/m2  Repeat blood pressure by me 160/90. General: Alert, oriented, no distress.  Skin: normal turgor, no rashes HEENT: Normocephalic, atraumatic. Pupils round and reactive; sclera anicteric;no lid lag.  Nose without nasal septal hypertrophy Mouth/Parynx benign; Mallinpatti scale 3/4 Neck: No JVD, no carotid briuts Lungs: clear to ausculatation and percussion; no wheezing or rales Heart: RRR, s1 s2 normal 1/6 systolic murmur Abdomen: Central adiposity soft, nontender; no hepatosplenomehaly, BS+; abdominal aorta nontender and not dilated by palpation. Pulses 2+ Extremities: Mild bilateral ankle swelling ; no clubbing cyanosis, Homan's sign negative  Neurologic: grossly nonfocal   LABS:  BMET    Component Value Date/Time   NA 138 08/13/2013 1502   K 4.0 08/13/2013 1502   CL 100  08/13/2013 1502   CO2 32 08/13/2013 1502   GLUCOSE 295* 08/13/2013 1502   BUN 17 08/13/2013 1502   CREATININE 1.5* 08/13/2013 1502   CALCIUM 9.4 08/13/2013 1502   GFRNONAA 19* 01/26/2013 0550   GFRAA 22* 01/26/2013 0550     Hepatic Function Panel     Component Value Date/Time   PROT 6.9 08/13/2013 1502   ALBUMIN 3.6 08/13/2013 1502   AST 19 08/13/2013 1502   ALT 19 08/13/2013 1502   ALKPHOS 70 08/13/2013 1502   BILITOT 0.2* 08/13/2013 1502   BILIDIR <0.1 01/25/2013 2140   IBILI NOT CALCULATED 01/25/2013 2140     CBC    Component Value Date/Time   WBC 7.7 01/26/2013 0550   RBC 4.55 01/26/2013 0550   HGB 11.0* 01/26/2013 0550   HCT 33.9* 01/26/2013 0550   PLT 203 01/26/2013 0550   MCV 74.5* 01/26/2013 0550   MCH 24.2* 01/26/2013 0550   MCHC 32.4 01/26/2013 0550   RDW 16.2* 01/26/2013 0550   LYMPHSABS 2.2 09/11/2012 1618   MONOABS 0.6 09/11/2012 1618   EOSABS 0.2 09/11/2012 1618   BASOSABS 0.0 09/11/2012 1618     BNP    Component Value Date/Time   PROBNP 828.0* 01/26/2013 0550    Lipid Panel     Component Value Date/Time   CHOL 192 08/13/2013 1502   TRIG 365.0* 08/13/2013 1502   HDL 43.60 08/13/2013 1502   CHOLHDL 4 08/13/2013 1502   VLDL 73.0* 08/13/2013 1502   LDLCALC 92 03/01/2009     RADIOLOGY: No results found.    ASSESSMENT AND PLAN:  Ms. Aundria Bitterman has established coronary disease dating back to the  1990s and has undergone intervention with stenting to her proximal LAD, AV groove circumflex, mid and distal right coronary arteries. Remotely, she had developed transient heart block when she was on high-dose verapamil and Bystolic. She has been off of verapamil and has been able to tolerate reinstitution of low-dose Bystolic currently at 2.5 twice a day. Her resting pulse today is in the 88. She continues to have leg swelling which I believe may also be contributed by her amlodipine. I will at recommend she decrease her amlodipine from 10 mg to 5 mg. I will try to slightly  titrate her Bystolic 25 mg in the morning and 2.5 mg at night. I will further add Spiriva lactone 12.5 mg daily to see if this can also improve her blood pressure. She will discontinue her 10 mEq supplemental potassium. In 2 weeks we will obtain a CMet to make certain renal function and liver functions studies are normal with the medication adjustments. I suspect her dyspnea is contributed by her moderate pulmonary hypertension rather than ischemia mediated. I will see her back in the office in 3 months for followup evaluation.   Lennette Bihari, MD, [facility]  09/10/2013 2:03 PM

## 2013-09-10 NOTE — Patient Instructions (Addendum)
Your physician has recommended you make the following change in your medication: decrease the amlodipine down to 5 mg daily. Increase  The bystolic to 5 mg in the morning.  And 2.5 mg in the PM. START spironaclactone 12.5 mg daily this has already been sent to the pharmacy. Stop the potassium.  Your physician recommends that you return for lab work in: 2 WEEKS.  Your physician recommends that you schedule a follow-up appointment in: 3 months.

## 2013-09-11 DIAGNOSIS — I2510 Atherosclerotic heart disease of native coronary artery without angina pectoris: Secondary | ICD-10-CM | POA: Diagnosis not present

## 2013-09-11 DIAGNOSIS — Z23 Encounter for immunization: Secondary | ICD-10-CM | POA: Diagnosis not present

## 2013-09-11 DIAGNOSIS — I10 Essential (primary) hypertension: Secondary | ICD-10-CM | POA: Diagnosis not present

## 2013-09-11 LAB — COMPREHENSIVE METABOLIC PANEL
ALT: 21 U/L (ref 0–35)
Albumin: 3.9 g/dL (ref 3.5–5.2)
Alkaline Phosphatase: 71 U/L (ref 39–117)
BUN: 18 mg/dL (ref 6–23)
CO2: 25 mEq/L (ref 19–32)
Glucose, Bld: 281 mg/dL — ABNORMAL HIGH (ref 70–99)
Potassium: 4 mEq/L (ref 3.5–5.3)

## 2013-09-24 DIAGNOSIS — G4730 Sleep apnea, unspecified: Secondary | ICD-10-CM | POA: Diagnosis not present

## 2013-09-24 DIAGNOSIS — I10 Essential (primary) hypertension: Secondary | ICD-10-CM | POA: Diagnosis not present

## 2013-09-24 DIAGNOSIS — J209 Acute bronchitis, unspecified: Secondary | ICD-10-CM | POA: Diagnosis not present

## 2013-09-24 DIAGNOSIS — E109 Type 1 diabetes mellitus without complications: Secondary | ICD-10-CM | POA: Diagnosis not present

## 2013-10-23 ENCOUNTER — Encounter: Payer: Self-pay | Admitting: Cardiovascular Disease

## 2013-10-27 DIAGNOSIS — M545 Low back pain: Secondary | ICD-10-CM | POA: Diagnosis not present

## 2013-10-27 DIAGNOSIS — Z9981 Dependence on supplemental oxygen: Secondary | ICD-10-CM | POA: Diagnosis not present

## 2013-10-27 DIAGNOSIS — R32 Unspecified urinary incontinence: Secondary | ICD-10-CM | POA: Diagnosis not present

## 2013-10-27 DIAGNOSIS — I252 Old myocardial infarction: Secondary | ICD-10-CM | POA: Diagnosis not present

## 2013-10-27 DIAGNOSIS — E669 Obesity, unspecified: Secondary | ICD-10-CM | POA: Diagnosis not present

## 2013-10-27 DIAGNOSIS — M6281 Muscle weakness (generalized): Secondary | ICD-10-CM | POA: Diagnosis not present

## 2013-10-27 DIAGNOSIS — N189 Chronic kidney disease, unspecified: Secondary | ICD-10-CM | POA: Diagnosis not present

## 2013-10-27 DIAGNOSIS — I129 Hypertensive chronic kidney disease with stage 1 through stage 4 chronic kidney disease, or unspecified chronic kidney disease: Secondary | ICD-10-CM | POA: Diagnosis not present

## 2013-10-27 DIAGNOSIS — I2510 Atherosclerotic heart disease of native coronary artery without angina pectoris: Secondary | ICD-10-CM | POA: Diagnosis not present

## 2013-10-27 DIAGNOSIS — Z794 Long term (current) use of insulin: Secondary | ICD-10-CM | POA: Diagnosis not present

## 2013-10-27 DIAGNOSIS — J209 Acute bronchitis, unspecified: Secondary | ICD-10-CM | POA: Diagnosis not present

## 2013-10-27 DIAGNOSIS — K5900 Constipation, unspecified: Secondary | ICD-10-CM | POA: Diagnosis not present

## 2013-10-28 DIAGNOSIS — D631 Anemia in chronic kidney disease: Secondary | ICD-10-CM | POA: Diagnosis not present

## 2013-10-28 DIAGNOSIS — N183 Chronic kidney disease, stage 3 unspecified: Secondary | ICD-10-CM | POA: Diagnosis not present

## 2013-10-29 ENCOUNTER — Other Ambulatory Visit: Payer: Self-pay | Admitting: Cardiovascular Disease

## 2013-10-29 DIAGNOSIS — J209 Acute bronchitis, unspecified: Secondary | ICD-10-CM | POA: Diagnosis not present

## 2013-10-29 DIAGNOSIS — I2510 Atherosclerotic heart disease of native coronary artery without angina pectoris: Secondary | ICD-10-CM | POA: Diagnosis not present

## 2013-10-29 DIAGNOSIS — I129 Hypertensive chronic kidney disease with stage 1 through stage 4 chronic kidney disease, or unspecified chronic kidney disease: Secondary | ICD-10-CM | POA: Diagnosis not present

## 2013-10-29 DIAGNOSIS — M545 Low back pain: Secondary | ICD-10-CM | POA: Diagnosis not present

## 2013-10-29 DIAGNOSIS — M6281 Muscle weakness (generalized): Secondary | ICD-10-CM | POA: Diagnosis not present

## 2013-10-29 NOTE — Telephone Encounter (Signed)
Rx was sent to pharmacy electronically. 

## 2013-10-30 DIAGNOSIS — M6281 Muscle weakness (generalized): Secondary | ICD-10-CM | POA: Diagnosis not present

## 2013-10-30 DIAGNOSIS — I2510 Atherosclerotic heart disease of native coronary artery without angina pectoris: Secondary | ICD-10-CM | POA: Diagnosis not present

## 2013-10-30 DIAGNOSIS — I129 Hypertensive chronic kidney disease with stage 1 through stage 4 chronic kidney disease, or unspecified chronic kidney disease: Secondary | ICD-10-CM | POA: Diagnosis not present

## 2013-10-30 DIAGNOSIS — J209 Acute bronchitis, unspecified: Secondary | ICD-10-CM | POA: Diagnosis not present

## 2013-10-30 DIAGNOSIS — M545 Low back pain: Secondary | ICD-10-CM | POA: Diagnosis not present

## 2013-11-02 DIAGNOSIS — I129 Hypertensive chronic kidney disease with stage 1 through stage 4 chronic kidney disease, or unspecified chronic kidney disease: Secondary | ICD-10-CM | POA: Diagnosis not present

## 2013-11-02 DIAGNOSIS — M6281 Muscle weakness (generalized): Secondary | ICD-10-CM | POA: Diagnosis not present

## 2013-11-02 DIAGNOSIS — M545 Low back pain: Secondary | ICD-10-CM | POA: Diagnosis not present

## 2013-11-02 DIAGNOSIS — I2510 Atherosclerotic heart disease of native coronary artery without angina pectoris: Secondary | ICD-10-CM | POA: Diagnosis not present

## 2013-11-02 DIAGNOSIS — J209 Acute bronchitis, unspecified: Secondary | ICD-10-CM | POA: Diagnosis not present

## 2013-11-04 DIAGNOSIS — I129 Hypertensive chronic kidney disease with stage 1 through stage 4 chronic kidney disease, or unspecified chronic kidney disease: Secondary | ICD-10-CM | POA: Diagnosis not present

## 2013-11-04 DIAGNOSIS — M545 Low back pain: Secondary | ICD-10-CM | POA: Diagnosis not present

## 2013-11-04 DIAGNOSIS — M6281 Muscle weakness (generalized): Secondary | ICD-10-CM | POA: Diagnosis not present

## 2013-11-04 DIAGNOSIS — I2510 Atherosclerotic heart disease of native coronary artery without angina pectoris: Secondary | ICD-10-CM | POA: Diagnosis not present

## 2013-11-04 DIAGNOSIS — J209 Acute bronchitis, unspecified: Secondary | ICD-10-CM | POA: Diagnosis not present

## 2013-11-05 DIAGNOSIS — M545 Low back pain: Secondary | ICD-10-CM | POA: Diagnosis not present

## 2013-11-05 DIAGNOSIS — J209 Acute bronchitis, unspecified: Secondary | ICD-10-CM | POA: Diagnosis not present

## 2013-11-05 DIAGNOSIS — I2510 Atherosclerotic heart disease of native coronary artery without angina pectoris: Secondary | ICD-10-CM | POA: Diagnosis not present

## 2013-11-05 DIAGNOSIS — M6281 Muscle weakness (generalized): Secondary | ICD-10-CM | POA: Diagnosis not present

## 2013-11-05 DIAGNOSIS — I129 Hypertensive chronic kidney disease with stage 1 through stage 4 chronic kidney disease, or unspecified chronic kidney disease: Secondary | ICD-10-CM | POA: Diagnosis not present

## 2013-11-06 DIAGNOSIS — J209 Acute bronchitis, unspecified: Secondary | ICD-10-CM | POA: Diagnosis not present

## 2013-11-06 DIAGNOSIS — I2510 Atherosclerotic heart disease of native coronary artery without angina pectoris: Secondary | ICD-10-CM | POA: Diagnosis not present

## 2013-11-06 DIAGNOSIS — I129 Hypertensive chronic kidney disease with stage 1 through stage 4 chronic kidney disease, or unspecified chronic kidney disease: Secondary | ICD-10-CM | POA: Diagnosis not present

## 2013-11-06 DIAGNOSIS — M6281 Muscle weakness (generalized): Secondary | ICD-10-CM | POA: Diagnosis not present

## 2013-11-06 DIAGNOSIS — M545 Low back pain: Secondary | ICD-10-CM | POA: Diagnosis not present

## 2013-11-10 DIAGNOSIS — J209 Acute bronchitis, unspecified: Secondary | ICD-10-CM | POA: Diagnosis not present

## 2013-11-10 DIAGNOSIS — I129 Hypertensive chronic kidney disease with stage 1 through stage 4 chronic kidney disease, or unspecified chronic kidney disease: Secondary | ICD-10-CM | POA: Diagnosis not present

## 2013-11-10 DIAGNOSIS — I2510 Atherosclerotic heart disease of native coronary artery without angina pectoris: Secondary | ICD-10-CM | POA: Diagnosis not present

## 2013-11-10 DIAGNOSIS — M6281 Muscle weakness (generalized): Secondary | ICD-10-CM | POA: Diagnosis not present

## 2013-11-10 DIAGNOSIS — M545 Low back pain: Secondary | ICD-10-CM | POA: Diagnosis not present

## 2013-11-13 DIAGNOSIS — M545 Low back pain: Secondary | ICD-10-CM | POA: Diagnosis not present

## 2013-11-13 DIAGNOSIS — I129 Hypertensive chronic kidney disease with stage 1 through stage 4 chronic kidney disease, or unspecified chronic kidney disease: Secondary | ICD-10-CM | POA: Diagnosis not present

## 2013-11-13 DIAGNOSIS — J209 Acute bronchitis, unspecified: Secondary | ICD-10-CM | POA: Diagnosis not present

## 2013-11-13 DIAGNOSIS — M6281 Muscle weakness (generalized): Secondary | ICD-10-CM | POA: Diagnosis not present

## 2013-11-13 DIAGNOSIS — I2510 Atherosclerotic heart disease of native coronary artery without angina pectoris: Secondary | ICD-10-CM | POA: Diagnosis not present

## 2013-11-17 DIAGNOSIS — M6281 Muscle weakness (generalized): Secondary | ICD-10-CM | POA: Diagnosis not present

## 2013-11-17 DIAGNOSIS — I2510 Atherosclerotic heart disease of native coronary artery without angina pectoris: Secondary | ICD-10-CM | POA: Diagnosis not present

## 2013-11-17 DIAGNOSIS — J209 Acute bronchitis, unspecified: Secondary | ICD-10-CM | POA: Diagnosis not present

## 2013-11-17 DIAGNOSIS — I129 Hypertensive chronic kidney disease with stage 1 through stage 4 chronic kidney disease, or unspecified chronic kidney disease: Secondary | ICD-10-CM | POA: Diagnosis not present

## 2013-11-17 DIAGNOSIS — M545 Low back pain: Secondary | ICD-10-CM | POA: Diagnosis not present

## 2013-11-18 DIAGNOSIS — M545 Low back pain: Secondary | ICD-10-CM | POA: Diagnosis not present

## 2013-11-18 DIAGNOSIS — M6281 Muscle weakness (generalized): Secondary | ICD-10-CM | POA: Diagnosis not present

## 2013-11-18 DIAGNOSIS — J209 Acute bronchitis, unspecified: Secondary | ICD-10-CM | POA: Diagnosis not present

## 2013-11-18 DIAGNOSIS — I129 Hypertensive chronic kidney disease with stage 1 through stage 4 chronic kidney disease, or unspecified chronic kidney disease: Secondary | ICD-10-CM | POA: Diagnosis not present

## 2013-11-18 DIAGNOSIS — I2510 Atherosclerotic heart disease of native coronary artery without angina pectoris: Secondary | ICD-10-CM | POA: Diagnosis not present

## 2013-11-20 DIAGNOSIS — M545 Low back pain, unspecified: Secondary | ICD-10-CM | POA: Diagnosis not present

## 2013-11-20 DIAGNOSIS — IMO0001 Reserved for inherently not codable concepts without codable children: Secondary | ICD-10-CM | POA: Diagnosis not present

## 2013-11-20 DIAGNOSIS — J209 Acute bronchitis, unspecified: Secondary | ICD-10-CM | POA: Diagnosis not present

## 2013-11-20 DIAGNOSIS — I2510 Atherosclerotic heart disease of native coronary artery without angina pectoris: Secondary | ICD-10-CM | POA: Diagnosis not present

## 2013-11-20 DIAGNOSIS — I129 Hypertensive chronic kidney disease with stage 1 through stage 4 chronic kidney disease, or unspecified chronic kidney disease: Secondary | ICD-10-CM | POA: Diagnosis not present

## 2013-11-20 DIAGNOSIS — M6281 Muscle weakness (generalized): Secondary | ICD-10-CM | POA: Diagnosis not present

## 2013-11-23 DIAGNOSIS — I129 Hypertensive chronic kidney disease with stage 1 through stage 4 chronic kidney disease, or unspecified chronic kidney disease: Secondary | ICD-10-CM | POA: Diagnosis not present

## 2013-11-23 DIAGNOSIS — IMO0001 Reserved for inherently not codable concepts without codable children: Secondary | ICD-10-CM | POA: Diagnosis not present

## 2013-11-23 DIAGNOSIS — M545 Low back pain, unspecified: Secondary | ICD-10-CM | POA: Diagnosis not present

## 2013-11-23 DIAGNOSIS — J209 Acute bronchitis, unspecified: Secondary | ICD-10-CM | POA: Diagnosis not present

## 2013-11-23 DIAGNOSIS — I2510 Atherosclerotic heart disease of native coronary artery without angina pectoris: Secondary | ICD-10-CM | POA: Diagnosis not present

## 2013-11-23 DIAGNOSIS — M6281 Muscle weakness (generalized): Secondary | ICD-10-CM | POA: Diagnosis not present

## 2013-11-25 DIAGNOSIS — M6281 Muscle weakness (generalized): Secondary | ICD-10-CM | POA: Diagnosis not present

## 2013-11-25 DIAGNOSIS — I129 Hypertensive chronic kidney disease with stage 1 through stage 4 chronic kidney disease, or unspecified chronic kidney disease: Secondary | ICD-10-CM | POA: Diagnosis not present

## 2013-11-25 DIAGNOSIS — M545 Low back pain, unspecified: Secondary | ICD-10-CM | POA: Diagnosis not present

## 2013-11-25 DIAGNOSIS — I2510 Atherosclerotic heart disease of native coronary artery without angina pectoris: Secondary | ICD-10-CM | POA: Diagnosis not present

## 2013-11-25 DIAGNOSIS — IMO0001 Reserved for inherently not codable concepts without codable children: Secondary | ICD-10-CM | POA: Diagnosis not present

## 2013-11-25 DIAGNOSIS — J209 Acute bronchitis, unspecified: Secondary | ICD-10-CM | POA: Diagnosis not present

## 2013-11-26 DIAGNOSIS — IMO0001 Reserved for inherently not codable concepts without codable children: Secondary | ICD-10-CM | POA: Diagnosis not present

## 2013-11-26 DIAGNOSIS — J209 Acute bronchitis, unspecified: Secondary | ICD-10-CM | POA: Diagnosis not present

## 2013-11-26 DIAGNOSIS — M6281 Muscle weakness (generalized): Secondary | ICD-10-CM | POA: Diagnosis not present

## 2013-11-26 DIAGNOSIS — M545 Low back pain, unspecified: Secondary | ICD-10-CM | POA: Diagnosis not present

## 2013-11-26 DIAGNOSIS — I129 Hypertensive chronic kidney disease with stage 1 through stage 4 chronic kidney disease, or unspecified chronic kidney disease: Secondary | ICD-10-CM | POA: Diagnosis not present

## 2013-11-26 DIAGNOSIS — I2510 Atherosclerotic heart disease of native coronary artery without angina pectoris: Secondary | ICD-10-CM | POA: Diagnosis not present

## 2013-11-27 DIAGNOSIS — IMO0001 Reserved for inherently not codable concepts without codable children: Secondary | ICD-10-CM | POA: Diagnosis not present

## 2013-11-27 DIAGNOSIS — M6281 Muscle weakness (generalized): Secondary | ICD-10-CM | POA: Diagnosis not present

## 2013-11-27 DIAGNOSIS — M545 Low back pain, unspecified: Secondary | ICD-10-CM | POA: Diagnosis not present

## 2013-11-27 DIAGNOSIS — J209 Acute bronchitis, unspecified: Secondary | ICD-10-CM | POA: Diagnosis not present

## 2013-11-27 DIAGNOSIS — I2510 Atherosclerotic heart disease of native coronary artery without angina pectoris: Secondary | ICD-10-CM | POA: Diagnosis not present

## 2013-11-27 DIAGNOSIS — I129 Hypertensive chronic kidney disease with stage 1 through stage 4 chronic kidney disease, or unspecified chronic kidney disease: Secondary | ICD-10-CM | POA: Diagnosis not present

## 2013-11-30 DIAGNOSIS — M545 Low back pain, unspecified: Secondary | ICD-10-CM | POA: Diagnosis not present

## 2013-11-30 DIAGNOSIS — I2510 Atherosclerotic heart disease of native coronary artery without angina pectoris: Secondary | ICD-10-CM | POA: Diagnosis not present

## 2013-11-30 DIAGNOSIS — M6281 Muscle weakness (generalized): Secondary | ICD-10-CM | POA: Diagnosis not present

## 2013-11-30 DIAGNOSIS — J209 Acute bronchitis, unspecified: Secondary | ICD-10-CM | POA: Diagnosis not present

## 2013-11-30 DIAGNOSIS — IMO0001 Reserved for inherently not codable concepts without codable children: Secondary | ICD-10-CM | POA: Diagnosis not present

## 2013-11-30 DIAGNOSIS — I129 Hypertensive chronic kidney disease with stage 1 through stage 4 chronic kidney disease, or unspecified chronic kidney disease: Secondary | ICD-10-CM | POA: Diagnosis not present

## 2013-12-01 DIAGNOSIS — M6281 Muscle weakness (generalized): Secondary | ICD-10-CM | POA: Diagnosis not present

## 2013-12-01 DIAGNOSIS — I129 Hypertensive chronic kidney disease with stage 1 through stage 4 chronic kidney disease, or unspecified chronic kidney disease: Secondary | ICD-10-CM | POA: Diagnosis not present

## 2013-12-01 DIAGNOSIS — M545 Low back pain, unspecified: Secondary | ICD-10-CM | POA: Diagnosis not present

## 2013-12-01 DIAGNOSIS — IMO0001 Reserved for inherently not codable concepts without codable children: Secondary | ICD-10-CM | POA: Diagnosis not present

## 2013-12-01 DIAGNOSIS — I2510 Atherosclerotic heart disease of native coronary artery without angina pectoris: Secondary | ICD-10-CM | POA: Diagnosis not present

## 2013-12-01 DIAGNOSIS — J209 Acute bronchitis, unspecified: Secondary | ICD-10-CM | POA: Diagnosis not present

## 2013-12-02 DIAGNOSIS — I129 Hypertensive chronic kidney disease with stage 1 through stage 4 chronic kidney disease, or unspecified chronic kidney disease: Secondary | ICD-10-CM | POA: Diagnosis not present

## 2013-12-02 DIAGNOSIS — IMO0001 Reserved for inherently not codable concepts without codable children: Secondary | ICD-10-CM | POA: Diagnosis not present

## 2013-12-02 DIAGNOSIS — I2510 Atherosclerotic heart disease of native coronary artery without angina pectoris: Secondary | ICD-10-CM | POA: Diagnosis not present

## 2013-12-02 DIAGNOSIS — M545 Low back pain, unspecified: Secondary | ICD-10-CM | POA: Diagnosis not present

## 2013-12-02 DIAGNOSIS — J209 Acute bronchitis, unspecified: Secondary | ICD-10-CM | POA: Diagnosis not present

## 2013-12-02 DIAGNOSIS — M6281 Muscle weakness (generalized): Secondary | ICD-10-CM | POA: Diagnosis not present

## 2013-12-03 ENCOUNTER — Encounter: Payer: Self-pay | Admitting: Endocrinology

## 2013-12-03 DIAGNOSIS — I2510 Atherosclerotic heart disease of native coronary artery without angina pectoris: Secondary | ICD-10-CM | POA: Diagnosis not present

## 2013-12-03 DIAGNOSIS — E119 Type 2 diabetes mellitus without complications: Secondary | ICD-10-CM | POA: Diagnosis not present

## 2013-12-03 DIAGNOSIS — J3489 Other specified disorders of nose and nasal sinuses: Secondary | ICD-10-CM | POA: Diagnosis not present

## 2013-12-04 DIAGNOSIS — IMO0001 Reserved for inherently not codable concepts without codable children: Secondary | ICD-10-CM | POA: Diagnosis not present

## 2013-12-04 DIAGNOSIS — M545 Low back pain, unspecified: Secondary | ICD-10-CM | POA: Diagnosis not present

## 2013-12-04 DIAGNOSIS — I2510 Atherosclerotic heart disease of native coronary artery without angina pectoris: Secondary | ICD-10-CM | POA: Diagnosis not present

## 2013-12-04 DIAGNOSIS — M6281 Muscle weakness (generalized): Secondary | ICD-10-CM | POA: Diagnosis not present

## 2013-12-04 DIAGNOSIS — J209 Acute bronchitis, unspecified: Secondary | ICD-10-CM | POA: Diagnosis not present

## 2013-12-04 DIAGNOSIS — I129 Hypertensive chronic kidney disease with stage 1 through stage 4 chronic kidney disease, or unspecified chronic kidney disease: Secondary | ICD-10-CM | POA: Diagnosis not present

## 2013-12-07 DIAGNOSIS — I2510 Atherosclerotic heart disease of native coronary artery without angina pectoris: Secondary | ICD-10-CM | POA: Diagnosis not present

## 2013-12-07 DIAGNOSIS — M6281 Muscle weakness (generalized): Secondary | ICD-10-CM | POA: Diagnosis not present

## 2013-12-07 DIAGNOSIS — M545 Low back pain, unspecified: Secondary | ICD-10-CM | POA: Diagnosis not present

## 2013-12-07 DIAGNOSIS — IMO0001 Reserved for inherently not codable concepts without codable children: Secondary | ICD-10-CM | POA: Diagnosis not present

## 2013-12-07 DIAGNOSIS — I129 Hypertensive chronic kidney disease with stage 1 through stage 4 chronic kidney disease, or unspecified chronic kidney disease: Secondary | ICD-10-CM | POA: Diagnosis not present

## 2013-12-07 DIAGNOSIS — J209 Acute bronchitis, unspecified: Secondary | ICD-10-CM | POA: Diagnosis not present

## 2013-12-08 DIAGNOSIS — M545 Low back pain, unspecified: Secondary | ICD-10-CM | POA: Diagnosis not present

## 2013-12-08 DIAGNOSIS — I2510 Atherosclerotic heart disease of native coronary artery without angina pectoris: Secondary | ICD-10-CM | POA: Diagnosis not present

## 2013-12-08 DIAGNOSIS — I129 Hypertensive chronic kidney disease with stage 1 through stage 4 chronic kidney disease, or unspecified chronic kidney disease: Secondary | ICD-10-CM | POA: Diagnosis not present

## 2013-12-08 DIAGNOSIS — J209 Acute bronchitis, unspecified: Secondary | ICD-10-CM | POA: Diagnosis not present

## 2013-12-08 DIAGNOSIS — IMO0001 Reserved for inherently not codable concepts without codable children: Secondary | ICD-10-CM | POA: Diagnosis not present

## 2013-12-08 DIAGNOSIS — M6281 Muscle weakness (generalized): Secondary | ICD-10-CM | POA: Diagnosis not present

## 2013-12-10 DIAGNOSIS — M545 Low back pain, unspecified: Secondary | ICD-10-CM | POA: Diagnosis not present

## 2013-12-10 DIAGNOSIS — I129 Hypertensive chronic kidney disease with stage 1 through stage 4 chronic kidney disease, or unspecified chronic kidney disease: Secondary | ICD-10-CM | POA: Diagnosis not present

## 2013-12-10 DIAGNOSIS — M6281 Muscle weakness (generalized): Secondary | ICD-10-CM | POA: Diagnosis not present

## 2013-12-10 DIAGNOSIS — J209 Acute bronchitis, unspecified: Secondary | ICD-10-CM | POA: Diagnosis not present

## 2013-12-10 DIAGNOSIS — IMO0001 Reserved for inherently not codable concepts without codable children: Secondary | ICD-10-CM | POA: Diagnosis not present

## 2013-12-10 DIAGNOSIS — I2510 Atherosclerotic heart disease of native coronary artery without angina pectoris: Secondary | ICD-10-CM | POA: Diagnosis not present

## 2013-12-11 DIAGNOSIS — J209 Acute bronchitis, unspecified: Secondary | ICD-10-CM | POA: Diagnosis not present

## 2013-12-11 DIAGNOSIS — M545 Low back pain, unspecified: Secondary | ICD-10-CM | POA: Diagnosis not present

## 2013-12-11 DIAGNOSIS — I2510 Atherosclerotic heart disease of native coronary artery without angina pectoris: Secondary | ICD-10-CM | POA: Diagnosis not present

## 2013-12-11 DIAGNOSIS — M6281 Muscle weakness (generalized): Secondary | ICD-10-CM | POA: Diagnosis not present

## 2013-12-11 DIAGNOSIS — I129 Hypertensive chronic kidney disease with stage 1 through stage 4 chronic kidney disease, or unspecified chronic kidney disease: Secondary | ICD-10-CM | POA: Diagnosis not present

## 2013-12-11 DIAGNOSIS — IMO0001 Reserved for inherently not codable concepts without codable children: Secondary | ICD-10-CM | POA: Diagnosis not present

## 2013-12-14 DIAGNOSIS — IMO0001 Reserved for inherently not codable concepts without codable children: Secondary | ICD-10-CM | POA: Diagnosis not present

## 2013-12-14 DIAGNOSIS — M6281 Muscle weakness (generalized): Secondary | ICD-10-CM | POA: Diagnosis not present

## 2013-12-14 DIAGNOSIS — I2510 Atherosclerotic heart disease of native coronary artery without angina pectoris: Secondary | ICD-10-CM | POA: Diagnosis not present

## 2013-12-14 DIAGNOSIS — J209 Acute bronchitis, unspecified: Secondary | ICD-10-CM | POA: Diagnosis not present

## 2013-12-14 DIAGNOSIS — M545 Low back pain, unspecified: Secondary | ICD-10-CM | POA: Diagnosis not present

## 2013-12-14 DIAGNOSIS — I129 Hypertensive chronic kidney disease with stage 1 through stage 4 chronic kidney disease, or unspecified chronic kidney disease: Secondary | ICD-10-CM | POA: Diagnosis not present

## 2013-12-15 DIAGNOSIS — IMO0001 Reserved for inherently not codable concepts without codable children: Secondary | ICD-10-CM | POA: Diagnosis not present

## 2013-12-15 DIAGNOSIS — I2510 Atherosclerotic heart disease of native coronary artery without angina pectoris: Secondary | ICD-10-CM | POA: Diagnosis not present

## 2013-12-15 DIAGNOSIS — J209 Acute bronchitis, unspecified: Secondary | ICD-10-CM | POA: Diagnosis not present

## 2013-12-15 DIAGNOSIS — M545 Low back pain, unspecified: Secondary | ICD-10-CM | POA: Diagnosis not present

## 2013-12-15 DIAGNOSIS — M6281 Muscle weakness (generalized): Secondary | ICD-10-CM | POA: Diagnosis not present

## 2013-12-15 DIAGNOSIS — I129 Hypertensive chronic kidney disease with stage 1 through stage 4 chronic kidney disease, or unspecified chronic kidney disease: Secondary | ICD-10-CM | POA: Diagnosis not present

## 2013-12-17 ENCOUNTER — Ambulatory Visit: Payer: Medicare Other | Admitting: Endocrinology

## 2013-12-17 DIAGNOSIS — L608 Other nail disorders: Secondary | ICD-10-CM | POA: Diagnosis not present

## 2013-12-17 DIAGNOSIS — L84 Corns and callosities: Secondary | ICD-10-CM | POA: Diagnosis not present

## 2013-12-17 DIAGNOSIS — E1059 Type 1 diabetes mellitus with other circulatory complications: Secondary | ICD-10-CM | POA: Diagnosis not present

## 2013-12-17 DIAGNOSIS — I739 Peripheral vascular disease, unspecified: Secondary | ICD-10-CM | POA: Diagnosis not present

## 2013-12-18 ENCOUNTER — Other Ambulatory Visit: Payer: Self-pay | Admitting: Endocrinology

## 2013-12-18 DIAGNOSIS — I129 Hypertensive chronic kidney disease with stage 1 through stage 4 chronic kidney disease, or unspecified chronic kidney disease: Secondary | ICD-10-CM | POA: Diagnosis not present

## 2013-12-18 DIAGNOSIS — M6281 Muscle weakness (generalized): Secondary | ICD-10-CM | POA: Diagnosis not present

## 2013-12-18 DIAGNOSIS — J209 Acute bronchitis, unspecified: Secondary | ICD-10-CM | POA: Diagnosis not present

## 2013-12-18 DIAGNOSIS — M545 Low back pain, unspecified: Secondary | ICD-10-CM | POA: Diagnosis not present

## 2013-12-18 DIAGNOSIS — E039 Hypothyroidism, unspecified: Secondary | ICD-10-CM | POA: Diagnosis not present

## 2013-12-18 DIAGNOSIS — IMO0001 Reserved for inherently not codable concepts without codable children: Secondary | ICD-10-CM | POA: Diagnosis not present

## 2013-12-18 DIAGNOSIS — I2510 Atherosclerotic heart disease of native coronary artery without angina pectoris: Secondary | ICD-10-CM | POA: Diagnosis not present

## 2013-12-18 LAB — COMPREHENSIVE METABOLIC PANEL
ALK PHOS: 59 U/L (ref 39–117)
ALT: 16 U/L (ref 0–35)
AST: 15 U/L (ref 0–37)
Albumin: 3.7 g/dL (ref 3.5–5.2)
BILIRUBIN TOTAL: 0.4 mg/dL (ref 0.2–1.2)
BUN: 16 mg/dL (ref 6–23)
CO2: 28 mEq/L (ref 19–32)
Calcium: 8.9 mg/dL (ref 8.4–10.5)
Chloride: 104 mEq/L (ref 96–112)
Creat: 1.3 mg/dL — ABNORMAL HIGH (ref 0.50–1.10)
Glucose, Bld: 192 mg/dL — ABNORMAL HIGH (ref 70–99)
Potassium: 4 mEq/L (ref 3.5–5.3)
SODIUM: 140 meq/L (ref 135–145)
TOTAL PROTEIN: 6.3 g/dL (ref 6.0–8.3)

## 2013-12-18 LAB — HEMOGLOBIN A1C
Hgb A1c MFr Bld: 11.5 % — ABNORMAL HIGH (ref <5.7)
Mean Plasma Glucose: 283 mg/dL — ABNORMAL HIGH (ref <117)

## 2013-12-18 LAB — LIPID PANEL
Cholesterol: 191 mg/dL (ref 0–200)
HDL: 39 mg/dL — ABNORMAL LOW (ref >39)
LDL CALC: 115 mg/dL — AB (ref 0–99)
TRIGLYCERIDES: 186 mg/dL — AB (ref <150)
Total CHOL/HDL Ratio: 4.9 Ratio
VLDL: 37 mg/dL (ref 0–40)

## 2013-12-19 LAB — T4, FREE: Free T4: 1.06 ng/dL (ref 0.80–1.80)

## 2013-12-19 LAB — TSH: TSH: 3.239 u[IU]/mL (ref 0.350–4.500)

## 2013-12-22 DIAGNOSIS — J209 Acute bronchitis, unspecified: Secondary | ICD-10-CM | POA: Diagnosis not present

## 2013-12-22 DIAGNOSIS — I2510 Atherosclerotic heart disease of native coronary artery without angina pectoris: Secondary | ICD-10-CM | POA: Diagnosis not present

## 2013-12-22 DIAGNOSIS — IMO0001 Reserved for inherently not codable concepts without codable children: Secondary | ICD-10-CM | POA: Diagnosis not present

## 2013-12-22 DIAGNOSIS — M545 Low back pain, unspecified: Secondary | ICD-10-CM | POA: Diagnosis not present

## 2013-12-22 DIAGNOSIS — M6281 Muscle weakness (generalized): Secondary | ICD-10-CM | POA: Diagnosis not present

## 2013-12-22 DIAGNOSIS — I129 Hypertensive chronic kidney disease with stage 1 through stage 4 chronic kidney disease, or unspecified chronic kidney disease: Secondary | ICD-10-CM | POA: Diagnosis not present

## 2013-12-23 DIAGNOSIS — J209 Acute bronchitis, unspecified: Secondary | ICD-10-CM | POA: Diagnosis not present

## 2013-12-23 DIAGNOSIS — M6281 Muscle weakness (generalized): Secondary | ICD-10-CM | POA: Diagnosis not present

## 2013-12-23 DIAGNOSIS — M545 Low back pain, unspecified: Secondary | ICD-10-CM | POA: Diagnosis not present

## 2013-12-23 DIAGNOSIS — IMO0001 Reserved for inherently not codable concepts without codable children: Secondary | ICD-10-CM | POA: Diagnosis not present

## 2013-12-23 DIAGNOSIS — I2510 Atherosclerotic heart disease of native coronary artery without angina pectoris: Secondary | ICD-10-CM | POA: Diagnosis not present

## 2013-12-23 DIAGNOSIS — I129 Hypertensive chronic kidney disease with stage 1 through stage 4 chronic kidney disease, or unspecified chronic kidney disease: Secondary | ICD-10-CM | POA: Diagnosis not present

## 2013-12-24 DIAGNOSIS — I509 Heart failure, unspecified: Secondary | ICD-10-CM | POA: Diagnosis not present

## 2013-12-24 DIAGNOSIS — M545 Low back pain, unspecified: Secondary | ICD-10-CM | POA: Diagnosis not present

## 2013-12-24 DIAGNOSIS — J209 Acute bronchitis, unspecified: Secondary | ICD-10-CM | POA: Diagnosis not present

## 2013-12-24 DIAGNOSIS — G4730 Sleep apnea, unspecified: Secondary | ICD-10-CM | POA: Diagnosis not present

## 2013-12-24 DIAGNOSIS — M6281 Muscle weakness (generalized): Secondary | ICD-10-CM | POA: Diagnosis not present

## 2013-12-24 DIAGNOSIS — E109 Type 1 diabetes mellitus without complications: Secondary | ICD-10-CM | POA: Diagnosis not present

## 2013-12-24 DIAGNOSIS — R197 Diarrhea, unspecified: Secondary | ICD-10-CM | POA: Diagnosis not present

## 2013-12-24 DIAGNOSIS — I2510 Atherosclerotic heart disease of native coronary artery without angina pectoris: Secondary | ICD-10-CM | POA: Diagnosis not present

## 2013-12-24 DIAGNOSIS — I129 Hypertensive chronic kidney disease with stage 1 through stage 4 chronic kidney disease, or unspecified chronic kidney disease: Secondary | ICD-10-CM | POA: Diagnosis not present

## 2013-12-24 DIAGNOSIS — IMO0001 Reserved for inherently not codable concepts without codable children: Secondary | ICD-10-CM | POA: Diagnosis not present

## 2013-12-25 ENCOUNTER — Ambulatory Visit: Payer: Medicare Other | Admitting: Endocrinology

## 2013-12-25 DIAGNOSIS — I129 Hypertensive chronic kidney disease with stage 1 through stage 4 chronic kidney disease, or unspecified chronic kidney disease: Secondary | ICD-10-CM | POA: Diagnosis not present

## 2013-12-25 DIAGNOSIS — M545 Low back pain, unspecified: Secondary | ICD-10-CM | POA: Diagnosis not present

## 2013-12-25 DIAGNOSIS — IMO0001 Reserved for inherently not codable concepts without codable children: Secondary | ICD-10-CM | POA: Diagnosis not present

## 2013-12-25 DIAGNOSIS — I2510 Atherosclerotic heart disease of native coronary artery without angina pectoris: Secondary | ICD-10-CM | POA: Diagnosis not present

## 2013-12-25 DIAGNOSIS — J209 Acute bronchitis, unspecified: Secondary | ICD-10-CM | POA: Diagnosis not present

## 2013-12-25 DIAGNOSIS — M6281 Muscle weakness (generalized): Secondary | ICD-10-CM | POA: Diagnosis not present

## 2013-12-26 DIAGNOSIS — I2510 Atherosclerotic heart disease of native coronary artery without angina pectoris: Secondary | ICD-10-CM | POA: Diagnosis not present

## 2013-12-26 DIAGNOSIS — M6281 Muscle weakness (generalized): Secondary | ICD-10-CM | POA: Diagnosis not present

## 2013-12-26 DIAGNOSIS — E669 Obesity, unspecified: Secondary | ICD-10-CM | POA: Diagnosis not present

## 2013-12-26 DIAGNOSIS — Z5189 Encounter for other specified aftercare: Secondary | ICD-10-CM | POA: Diagnosis not present

## 2013-12-26 DIAGNOSIS — N189 Chronic kidney disease, unspecified: Secondary | ICD-10-CM | POA: Diagnosis not present

## 2013-12-26 DIAGNOSIS — M545 Low back pain, unspecified: Secondary | ICD-10-CM | POA: Diagnosis not present

## 2013-12-26 DIAGNOSIS — J449 Chronic obstructive pulmonary disease, unspecified: Secondary | ICD-10-CM | POA: Diagnosis not present

## 2013-12-26 DIAGNOSIS — Z9981 Dependence on supplemental oxygen: Secondary | ICD-10-CM | POA: Diagnosis not present

## 2013-12-26 DIAGNOSIS — I129 Hypertensive chronic kidney disease with stage 1 through stage 4 chronic kidney disease, or unspecified chronic kidney disease: Secondary | ICD-10-CM | POA: Diagnosis not present

## 2013-12-26 DIAGNOSIS — E119 Type 2 diabetes mellitus without complications: Secondary | ICD-10-CM | POA: Diagnosis not present

## 2013-12-26 DIAGNOSIS — Z794 Long term (current) use of insulin: Secondary | ICD-10-CM | POA: Diagnosis not present

## 2013-12-26 DIAGNOSIS — M25559 Pain in unspecified hip: Secondary | ICD-10-CM | POA: Diagnosis not present

## 2013-12-28 ENCOUNTER — Encounter: Payer: Self-pay | Admitting: Cardiovascular Disease

## 2013-12-28 ENCOUNTER — Ambulatory Visit (INDEPENDENT_AMBULATORY_CARE_PROVIDER_SITE_OTHER): Payer: Medicare Other | Admitting: Cardiovascular Disease

## 2013-12-28 VITALS — BP 148/70 | HR 78 | Ht 67.0 in | Wt 233.8 lb

## 2013-12-28 DIAGNOSIS — I252 Old myocardial infarction: Secondary | ICD-10-CM | POA: Diagnosis not present

## 2013-12-28 DIAGNOSIS — Z79899 Other long term (current) drug therapy: Secondary | ICD-10-CM | POA: Diagnosis not present

## 2013-12-28 DIAGNOSIS — R0602 Shortness of breath: Secondary | ICD-10-CM | POA: Diagnosis not present

## 2013-12-28 DIAGNOSIS — R609 Edema, unspecified: Secondary | ICD-10-CM

## 2013-12-28 DIAGNOSIS — I2510 Atherosclerotic heart disease of native coronary artery without angina pectoris: Secondary | ICD-10-CM | POA: Diagnosis not present

## 2013-12-28 DIAGNOSIS — I10 Essential (primary) hypertension: Secondary | ICD-10-CM

## 2013-12-28 DIAGNOSIS — G4730 Sleep apnea, unspecified: Secondary | ICD-10-CM

## 2013-12-28 DIAGNOSIS — E785 Hyperlipidemia, unspecified: Secondary | ICD-10-CM

## 2013-12-28 DIAGNOSIS — R600 Localized edema: Secondary | ICD-10-CM

## 2013-12-28 MED ORDER — TORSEMIDE 20 MG PO TABS: ORAL_TABLET | ORAL | Status: DC

## 2013-12-28 NOTE — Patient Instructions (Addendum)
Your physician has recommended you make the following change in your medication: STOP the FUROSEMIDE. Start the torsemide prescription. This has already been sent to the pharmacy.  Your physician recommends that you schedule a follow-up appointment in: 6 weeks.  Your physician recommends that you return for lab work in: 2 weeks.

## 2013-12-29 ENCOUNTER — Ambulatory Visit: Payer: Medicare Other | Admitting: Endocrinology

## 2013-12-30 ENCOUNTER — Encounter: Payer: Self-pay | Admitting: Cardiovascular Disease

## 2013-12-30 DIAGNOSIS — M545 Low back pain, unspecified: Secondary | ICD-10-CM | POA: Diagnosis not present

## 2013-12-30 DIAGNOSIS — M25559 Pain in unspecified hip: Secondary | ICD-10-CM | POA: Diagnosis not present

## 2013-12-30 DIAGNOSIS — I129 Hypertensive chronic kidney disease with stage 1 through stage 4 chronic kidney disease, or unspecified chronic kidney disease: Secondary | ICD-10-CM | POA: Diagnosis not present

## 2013-12-30 DIAGNOSIS — Z5189 Encounter for other specified aftercare: Secondary | ICD-10-CM | POA: Diagnosis not present

## 2013-12-30 DIAGNOSIS — M6281 Muscle weakness (generalized): Secondary | ICD-10-CM | POA: Diagnosis not present

## 2013-12-30 DIAGNOSIS — E119 Type 2 diabetes mellitus without complications: Secondary | ICD-10-CM | POA: Diagnosis not present

## 2013-12-30 NOTE — Progress Notes (Signed)
Patient ID: Krystal [name], female   DOB: 03/29/1936, 78 y.o.   MRN: [id number]       HPI: Krystal [name] is a 78 y.o. female who presents to the office for a followup cardiology evaluation.  Ms. Quakenbush has a history of hypertension, type 2 diabetes mellitus, obstructive sleep apnea on CPAP therapy, and has documented diastolic dysfunction. She has coronary artery disease dating back to the 1990s and in the past had undergone multiple stent procedures involving the proximal LAD,  circumflex, mid right coronary artery, and distal right coronary artery. Her last catheterization in October 2013 by Dr Sallyanne Kuster did not reveal high-grade restenosis. She did have 80-90% ostial stenosis and a small jailed first septal perforating artery. She had mild calcification at 20-30% narrowing in the stent in the circumflex vessel. The right coronary artery had 3 stents and there was 40-50% in-stent smooth narrowing with 30% narrowing in the distal stent. Ejection fraction was 65%. She did have moderate pulmonary hypertension with a PA pressure 47/20 was found to have acute on chronic diastolic left ventricular failure. Recently, she has noticed weakness at times and dizziness. She also feels at times her heart "jumps ."  She does note some occasional episodes of chest tightness. She is on oxygen 2 L.  Remotely while she was on high-dose verapamil and Bystolic she did develop transient heart block and these medicines were ultimately discontinued. When I saw her approximately 8 weeks ago I  recommended a rechallenge of very low-dose Bystolic at 2.5 mg daily which she tolerated and when I last saw her I further titrated his to 2.5 mg twice a day.   She underwent a nuclear perfusion study on 08/14/2013 which was low risk and showed mild breast attenuation but otherwise normal perfusion. Post-stress ejection fraction was 62%.   I last saw her, I added prolactin on to her medical regimen and recommended she discontinue  supplemental potassium. Ms. Barber continues to note leg swelling. She continues to experience shortness of breath with activity and fatigue. She denies exertional chest pain She is sleeping with CPAP. She has gained approximately 4 pounds.   Past Medical History  Diagnosis Date  . Sleep apnea     on C-pap  . Occult blood in stools   . Myalgia   . Abnormal PFT   . DM (diabetes mellitus)     type II uncontrolled  . Palpitations   . Microalbuminuria   . Microcytic anemia   . DJD (degenerative joint disease)   . Constipation   . CAD (coronary artery disease) 2009    5 stents- #3 in RCA, #1 each in LAD and AVG  . Osteoarthritis   . Low back pain   . HTN (hypertension)   . Hyperlipidemia   . GERD (gastroesophageal reflux disease)   . Allergic rhinitis   . Personal history of colonic polyps   . HOH (hard of hearing)   . MI (myocardial infarction) 72  . Glaucoma   . Diastolic dysfunction, left ventricle 09/12/12  . Cancer     uterine  . Acute renal failure due to procedure 09/18/2012  . CKD (chronic kidney disease) stage 3, GFR 30-59 ml/min 09/18/2012  . Heart block 02/2013    transient    Past Surgical History  Procedure Laterality Date  . Ectopic pregnancy surgery    . Foot surgery      left and right for callous  . Cervical discectomy      L5 left/hemilminectomy  .  Colonoscopy  09/2006    int hemmorhoids, COMPLICATED BY CARDIOPULMONARY COMPLICATIONS  . Abdominal hysterectomy      uterine cancer  . Polypectomy  10/22/2011    internal hemorrhoids/sessile polyp  . Coronary angioplasty  1/99, 1/07, 1/08, 4/09    5 cardiac stents total    Allergies  Allergen Reactions  . Morphine Shortness Of Breath and Swelling  . Penicillins Shortness Of Breath and Swelling    Current Outpatient Prescriptions  Medication Sig Dispense Refill  . amLODipine (NORVASC) 5 MG tablet Take 1 tablet (5 mg total) by mouth daily.  60 tablet  5  . aspirin 81 MG EC tablet Take 81 mg by mouth  every morning.       . budesonide-formoterol (SYMBICORT) 160-4.5 MCG/ACT inhaler Inhale 2 puffs into the lungs 2 (two) times daily as needed (for shortness of breath).      . calcitRIOL (ROCALTROL) 0.25 MCG capsule       . clopidogrel (PLAVIX) 75 MG tablet Take 75 mg by mouth every morning.       . ezetimibe (ZETIA) 10 MG tablet Take 10 mg by mouth every morning.       . famotidine (PEPCID) 20 MG tablet Take 1 tablet (20 mg total) by mouth 2 (two) times daily.  60 tablet  9  . ferrous sulfate 325 (65 FE) MG tablet Take 325 mg by mouth daily.       . Fluticasone-Salmeterol (ADVAIR DISKUS) 250-50 MCG/DOSE AEPB Inhale 1 puff into the lungs 2 (two) times daily.       Marland Kitchen HYDROcodone-acetaminophen (VICODIN) 5-500 MG per tablet       . insulin aspart (NOVOLOG) 100 UNIT/ML injection Inject 40 Units into the skin 4 (four) times daily.       . insulin glargine (LANTUS) 100 UNIT/ML injection Inject 70 Units into the skin at bedtime.       . isosorbide mononitrate (IMDUR) 60 MG 24 hr tablet TAKE 1 TABLET ONCE DAILY.  30 tablet  6  . loratadine (CLARITIN) 10 MG tablet Take 10 mg by mouth daily as needed for allergies.      . montelukast (SINGULAIR) 10 MG tablet       . nebivolol (BYSTOLIC) 5 MG tablet Take 1 tablet in the morning and 1/2 tablet in the PM  45 tablet  11  . NITROSTAT 0.4 MG SL tablet Take 1 tablet by mouth every 5 (five) minutes x 3 doses as needed. For chest pains      . OXYGEN-HELIUM IN Inhale into the lungs. 2 liters continuous      . RANEXA 1000 MG SR tablet TAKE (1) TABLET TWICE DAILY.  60 tablet  10  . rosuvastatin (CRESTOR) 20 MG tablet Take 20 mg by mouth every evening.      Marland Kitchen spironolactone (ALDACTONE) 25 MG tablet Take 1/2 tablet daily  30 tablet  6  . torsemide (DEMADEX) 20 MG tablet Take 2 tablets twice daily for 2 days then 2 tablets in the morning and 1 in the afternoon  94 tablet  6   No current facility-administered medications for this visit.    History   Social History    . Marital Status: Widowed    Spouse Name: N/A    Number of Children: N/A  . Years of Education: N/A   Occupational History  . Not on file.   Social History Main Topics  . Smoking status: Former Smoker -- 0.40 packs/day    Types: Cigarettes  Quit date: 10/18/2006  . Smokeless tobacco: Not on file     Comment: quit about 4 yrs ago  . Alcohol Use: No  . Drug Use: No  . Sexual Activity: Yes    Birth Control/ Protection: Surgical   Other Topics Concern  . Not on file   Social History Narrative  . No narrative on file    Family History  Problem Relation Age of Onset  . Anesthesia problems Neg Hx   . Hypotension Neg Hx   . Malignant hyperthermia Neg Hx   . Pseudochol deficiency Neg Hx     ROS is negative for fevers, chills or night sweats. She admits to weight gain. She denies change in vision or hearing. She does note weakness. At times there is some transient dizziness. She has noticed that her pulse is not quite as fast restarting low-dose Bystolic.  She denies any syncope. She still experiences vague chest tightness. She denies bleeding.  She denies nausea vomiting or diarrhea. There is no blood in stool or urine. She is on 2 L of oxygen supplement during the day and uses her CPAP therapy with oxygen nocturnally. She does admit to increased daytime sleepiness. She has not had a download checked in some time with reference to her CPAP unit. She gets her supplies from [employer]. She denies claudication She complains of ankle swelling. She denies paresthesias Other comprehensive 14 point system review is negative.  PE BP 148/70  Pulse 78  Ht 5\' 7"  (1.702 m)  Wt 233 lb 12.8 oz (106.051 kg)  BMI 36.61 kg/m2  Repeat blood pressure by me 160/90. General: Alert, oriented, no distress.  Skin: normal turgor, no rashes HEENT: Normocephalic, atraumatic. Pupils round and reactive; sclera anicteric;no lid lag. No xanthelasmas Nose without nasal septal  hypertrophy Mouth/Parynx benign; Mallinpatti scale 3/4 Neck: No JVD, no carotid bruits with normal carotid up stroke. Lungs: clear to ausculatation and percussion; no wheezing or rales Heart: RRR, s1 s2 normal 1/6 systolic murmur; no diastolic murmur. No S3 or S4 gallop. Abdomen: Central adiposity soft, nontender; no hepatosplenomehaly, BS+; abdominal aorta nontender and not dilated by palpation. Pulses 2+ Extremities: Bilateral ankle swelling ; no clubbing cyanosis, Homan's sign negative  Neurologic: grossly nonfocal; cranial nerves grossly normal Psychological: Normal affect.   ECG (and apparently read by me): Normal sinus rhythm at 78 beats per minute. Suggestion of left atrial enlargement. Nonspecific ST-T changes. QTc interval 451 ms.  LABS:  BMET    Component Value Date/Time   NA 140 12/18/2013 1058   K 4.0 12/18/2013 1058   CL 104 12/18/2013 1058   CO2 28 12/18/2013 1058   GLUCOSE 192* 12/18/2013 1058   BUN 16 12/18/2013 1058   CREATININE 1.30* 12/18/2013 1058   CREATININE 1.5* 08/13/2013 1502   CALCIUM 8.9 12/18/2013 1058   GFRNONAA 19* 01/26/2013 0550   GFRAA 22* 01/26/2013 0550     Hepatic Function Panel     Component Value Date/Time   PROT 6.3 12/18/2013 1058   ALBUMIN 3.7 12/18/2013 1058   AST 15 12/18/2013 1058   ALT 16 12/18/2013 1058   ALKPHOS 59 12/18/2013 1058   BILITOT 0.4 12/18/2013 1058   BILIDIR <0.1 01/25/2013 2140   IBILI NOT CALCULATED 01/25/2013 2140     CBC    Component Value Date/Time   WBC 7.7 01/26/2013 0550   RBC 4.55 01/26/2013 0550   HGB 11.0* 01/26/2013 0550   HCT 33.9* 01/26/2013 0550   PLT 203 01/26/2013 0550  MCV 74.5* 01/26/2013 0550   MCH 24.2* 01/26/2013 0550   MCHC 32.4 01/26/2013 0550   RDW 16.2* 01/26/2013 0550   LYMPHSABS 2.2 09/11/2012 1618   MONOABS 0.6 09/11/2012 1618   EOSABS 0.2 09/11/2012 1618   BASOSABS 0.0 09/11/2012 1618     BNP    Component Value Date/Time   PROBNP 828.0* 01/26/2013 0550    Lipid Panel     Component  Value Date/Time   CHOL 191 12/18/2013 1058   TRIG 186* 12/18/2013 1058   HDL 39* 12/18/2013 1058   CHOLHDL 4.9 12/18/2013 1058   VLDL 37 12/18/2013 1058   LDLCALC 115* 12/18/2013 1058     RADIOLOGY: No results found.    ASSESSMENT AND PLAN:  Ms. Salene Mohamud has established coronary disease dating back to the 1990s and has undergone intervention with stenting to her proximal LAD, AV groove circumflex, mid and distal right coronary arteries. Remotely, she had developed transient heart block when she was on high-dose verapamil and Bystolic. She has been off verapamil and has been able to tolerate reinstitution of low-dose Bystolic currently at 2.5 twice a day. Her resting pulse today is at 78. She is now on spironolactone. She does not seem to be benefiting from her current dose of furosemide and as a result we'll change this to torsemide and have her take 40 mg twice a day for 2 days and then 40 mg in the morning and 20 mg in the afternoon. I'm also recommending that we obtain a download of her CPAP unit and live her residual daytime sleepiness despite her using CPAP 100% of the time. I have recommended a followup Bmet  in 2 weeks. I will see her in 6 weeks for cardiology reevaluation.   Troy Sine, MD, [facility]  12/30/2013 7:02 PM

## 2013-12-31 DIAGNOSIS — Z5189 Encounter for other specified aftercare: Secondary | ICD-10-CM | POA: Diagnosis not present

## 2013-12-31 DIAGNOSIS — M545 Low back pain, unspecified: Secondary | ICD-10-CM | POA: Diagnosis not present

## 2013-12-31 DIAGNOSIS — M6281 Muscle weakness (generalized): Secondary | ICD-10-CM | POA: Diagnosis not present

## 2013-12-31 DIAGNOSIS — M25559 Pain in unspecified hip: Secondary | ICD-10-CM | POA: Diagnosis not present

## 2013-12-31 DIAGNOSIS — E119 Type 2 diabetes mellitus without complications: Secondary | ICD-10-CM | POA: Diagnosis not present

## 2013-12-31 DIAGNOSIS — I129 Hypertensive chronic kidney disease with stage 1 through stage 4 chronic kidney disease, or unspecified chronic kidney disease: Secondary | ICD-10-CM | POA: Diagnosis not present

## 2014-01-04 DIAGNOSIS — M545 Low back pain, unspecified: Secondary | ICD-10-CM | POA: Diagnosis not present

## 2014-01-04 DIAGNOSIS — M6281 Muscle weakness (generalized): Secondary | ICD-10-CM | POA: Diagnosis not present

## 2014-01-04 DIAGNOSIS — Z5189 Encounter for other specified aftercare: Secondary | ICD-10-CM | POA: Diagnosis not present

## 2014-01-04 DIAGNOSIS — M25559 Pain in unspecified hip: Secondary | ICD-10-CM | POA: Diagnosis not present

## 2014-01-04 DIAGNOSIS — E119 Type 2 diabetes mellitus without complications: Secondary | ICD-10-CM | POA: Diagnosis not present

## 2014-01-04 DIAGNOSIS — I129 Hypertensive chronic kidney disease with stage 1 through stage 4 chronic kidney disease, or unspecified chronic kidney disease: Secondary | ICD-10-CM | POA: Diagnosis not present

## 2014-01-05 DIAGNOSIS — E119 Type 2 diabetes mellitus without complications: Secondary | ICD-10-CM | POA: Diagnosis not present

## 2014-01-05 DIAGNOSIS — M545 Low back pain, unspecified: Secondary | ICD-10-CM | POA: Diagnosis not present

## 2014-01-05 DIAGNOSIS — M6281 Muscle weakness (generalized): Secondary | ICD-10-CM | POA: Diagnosis not present

## 2014-01-05 DIAGNOSIS — I129 Hypertensive chronic kidney disease with stage 1 through stage 4 chronic kidney disease, or unspecified chronic kidney disease: Secondary | ICD-10-CM | POA: Diagnosis not present

## 2014-01-05 DIAGNOSIS — M25559 Pain in unspecified hip: Secondary | ICD-10-CM | POA: Diagnosis not present

## 2014-01-05 DIAGNOSIS — Z5189 Encounter for other specified aftercare: Secondary | ICD-10-CM | POA: Diagnosis not present

## 2014-01-07 DIAGNOSIS — Z5189 Encounter for other specified aftercare: Secondary | ICD-10-CM | POA: Diagnosis not present

## 2014-01-07 DIAGNOSIS — M6281 Muscle weakness (generalized): Secondary | ICD-10-CM | POA: Diagnosis not present

## 2014-01-07 DIAGNOSIS — M25559 Pain in unspecified hip: Secondary | ICD-10-CM | POA: Diagnosis not present

## 2014-01-07 DIAGNOSIS — E119 Type 2 diabetes mellitus without complications: Secondary | ICD-10-CM | POA: Diagnosis not present

## 2014-01-07 DIAGNOSIS — M545 Low back pain, unspecified: Secondary | ICD-10-CM | POA: Diagnosis not present

## 2014-01-07 DIAGNOSIS — I129 Hypertensive chronic kidney disease with stage 1 through stage 4 chronic kidney disease, or unspecified chronic kidney disease: Secondary | ICD-10-CM | POA: Diagnosis not present

## 2014-01-11 ENCOUNTER — Ambulatory Visit (INDEPENDENT_AMBULATORY_CARE_PROVIDER_SITE_OTHER): Payer: Medicare Other | Admitting: Endocrinology

## 2014-01-11 ENCOUNTER — Encounter: Payer: Self-pay | Admitting: Endocrinology

## 2014-01-11 VITALS — BP 122/52 | HR 70 | Temp 98.2°F | Resp 16 | Ht 67.0 in | Wt 233.2 lb

## 2014-01-11 DIAGNOSIS — I2510 Atherosclerotic heart disease of native coronary artery without angina pectoris: Secondary | ICD-10-CM | POA: Diagnosis not present

## 2014-01-11 DIAGNOSIS — IMO0001 Reserved for inherently not codable concepts without codable children: Secondary | ICD-10-CM | POA: Diagnosis not present

## 2014-01-11 DIAGNOSIS — E1165 Type 2 diabetes mellitus with hyperglycemia: Principal | ICD-10-CM

## 2014-01-11 DIAGNOSIS — E785 Hyperlipidemia, unspecified: Secondary | ICD-10-CM

## 2014-01-11 LAB — GLUCOSE, POCT (MANUAL RESULT ENTRY): POC GLUCOSE: 111 mg/dL — AB (ref 70–99)

## 2014-01-11 MED ORDER — CANAGLIFLOZIN 100 MG PO TABS: 100.0000 mg | ORAL_TABLET | Freq: Every day | ORAL | Status: DC

## 2014-01-11 NOTE — Patient Instructions (Addendum)
Gray pen LANTUS 40 UNITS AT 10 AM AND 10 PM  Orange Pen 50 at breakfast and supper  Invokana 100mg  in am daily  Call if sugars get < 100

## 2014-01-11 NOTE — Progress Notes (Signed)
Patient ID: Krystal [name], female   DOB: 09/11/1936, 78 y.o.   MRN: [id number]  Reason for Appointment: Diabetes follow-up   History of Present Illness   Diagnosis: date of diagnosis: 1999, Type 2 diabetes mellitus.   PAST history: She has had long-standing diabetes which had been initially treated with oral agents. She was started on premixed insulin possibly in 2005. She also has been on metformin, Januvia, Actos and glipizide in the past. Actos was stopped in 2009 when she had edema. Metformin was stopped because of renal dysfunction Overall her blood sugar control has been poor with A1c consistently over 7% and since about 2009 has been mostly over 10%. INSULIN doses have been progressively increased. However her evening dose has been variable because of the tendency to hypoglycemia overnight; was taking pm dose hs. She had been continued on premixed insulin because of simplicity of dosing.   RECENT history: She has not been seen in followup since 07/2013 She has been on a basal bolus regimen with Lantus and NovoLog in 2014. Appears that initially the readings were better with this regimen but they are persistently high.  Her A1c is not any better She is  getting usually compliant with insulin at mealtimes but has somewhat variable mealtimes and usually no lunch She continues to have significantly high FASTING readings despite taking large amount of Lantus; also daytime readings are not much better with switching this to the morning At times afternoon blood sugars are higher also; will have occasional good readings sporadically including today She is not sure of Victoza was helping and she could not afford it She has been to the nurse educator also for education a couple of times without much benefit  Current insulin dose: LANTUS insulin 74 IN AM; NOVOLOG 42 BID-tid.    Side effects from medications: Edema from Actos.  Monitors blood glucose: Twice a day.  Glucometer: Freestyle.  Blood  Glucose readings:  PREMEAL  late a.m.   3-7 PM    bedtime   overnight  Overall  Glucose range:  201-305   112-405   245-358   126-228   112-405   Mean/median:      273    Hypoglycemia frequency: none  Meals: 3 meals per day with breakfast at 10 am (egg, bacon, toast) ; usually minimal lunch; dinner 5-6 pm. The diet that the patient has been following is none, does not always restrict fat intake and will try to eat sugar-free desserts, eating out about twice a week. Juice at times Physical activity: exercise: none, gets short of breath on activity.  Dietician visit: 5/14.  CDE 6/14 Retinal exam: Most recent:12/13.  Complications: nephropathy.  Wt Readings from Last 3 Encounters:  01/11/14 233 lb 3.2 oz (105.779 kg)  12/28/13 233 lb 12.8 oz (106.051 kg)  09/10/13 229 lb 4.8 oz (104.01 kg)   Glycemic control:  Lab Results  Component Value Date   HGBA1C 11.5* 12/18/2013   HGBA1C 11.6* 08/13/2013   HGBA1C 10.1* 01/26/2013   Lab Results  Component Value Date   MICROALBUR 73.1* 08/14/2013   LDLCALC 115* 12/18/2013   CREATININE 1.30* 12/18/2013    The microalbumin has been tested , and the result was 860.   Office Visit on 01/11/2014  Component Date Value Ref Range Status  . POC Glucose 01/11/2014 111* 70 - 99 mg/dl Final  Orders Only on 12/18/2013  Component Date Value Ref Range Status  . Sodium 12/18/2013 140  135 - 145 mEq/L Final  .  Potassium 12/18/2013 4.0  3.5 - 5.3 mEq/L Final  . Chloride 12/18/2013 104  96 - 112 mEq/L Final  . CO2 12/18/2013 28  19 - 32 mEq/L Final  . Glucose, Bld 12/18/2013 192* 70 - 99 mg/dL Final  . BUN 12/18/2013 16  6 - 23 mg/dL Final  . Creat 12/18/2013 1.30* 0.50 - 1.10 mg/dL Final  . Total Bilirubin 12/18/2013 0.4  0.2 - 1.2 mg/dL Final   ** Please note change in reference range(s). **  . Alkaline Phosphatase 12/18/2013 59  39 - 117 U/L Final  . AST 12/18/2013 15  0 - 37 U/L Final  . ALT 12/18/2013 16  0 - 35 U/L Final  . Total Protein  12/18/2013 6.3  6.0 - 8.3 g/dL Final  . Albumin 12/18/2013 3.7  3.5 - 5.2 g/dL Final  . Calcium 12/18/2013 8.9  8.4 - 10.5 mg/dL Final  . Cholesterol 12/18/2013 191  0 - 200 mg/dL Final   Comment: ATP III Classification:                                < 200        mg/dL        Desirable                               200 - 239     mg/dL        Borderline High                               >= 240        mg/dL        High                             . Triglycerides 12/18/2013 186* <150 mg/dL Final  . HDL 12/18/2013 39* >39 mg/dL Final  . Total CHOL/HDL Ratio 12/18/2013 4.9   Final  . VLDL 12/18/2013 37  0 - 40 mg/dL Final  . LDL Cholesterol 12/18/2013 115* 0 - 99 mg/dL Final   Comment:                            Total Cholesterol/HDL Ratio:CHD Risk                                                 Coronary Heart Disease Risk Table                                                                 Men       Women                                   1/2 Average Risk              3.4  3.3                                       Average Risk              5.0        4.4                                    2X Average Risk              9.6        7.1                                    3X Average Risk             23.4       11.0                          Use the calculated Patient Ratio above and the CHD Risk table                           to determine the patient's CHD Risk.                          ATP III Classification (LDL):                                < 100        mg/dL         Optimal                               100 - 129     mg/dL         Near or Above Optimal                               130 - 159     mg/dL         Borderline High                               160 - 189     mg/dL         High                                > 190        mg/dL         Very High                             . TSH 12/18/2013 3.239  0.350 - 4.500 uIU/mL Final  . Free T4 12/18/2013 1.06  0.80 - 1.80 ng/dL  Final  . Hemoglobin A1C 12/18/2013 11.5* <5.7 % Final   Comment:  According to the ADA Clinical Practice Recommendations for 2011, when                          HbA1c is used as a screening test:                                                       >=6.5%   Diagnostic of Diabetes Mellitus                                     (if abnormal result is confirmed)                                                     5.7-6.4%   Increased risk of developing Diabetes Mellitus                                                     References:Diagnosis and Classification of Diabetes Mellitus,Diabetes                          [id number] 1):S62-S69 and Standards of Medical Care in                                  Diabetes - 2011,Diabetes [id number] (Suppl 1):S11-S61.                             . Mean Plasma Glucose 12/18/2013 283* <117 mg/dL Final         Medication List       This list is accurate as of: 01/11/14  4:14 PM.  Always use your most recent med list.               ADVAIR DISKUS 250-50 MCG/DOSE Aepb  Generic drug:  Fluticasone-Salmeterol  Inhale 1 puff into the lungs 2 (two) times daily.     amLODipine 5 MG tablet  Commonly known as:  NORVASC  Take 1 tablet (5 mg total) by mouth daily.     aspirin 81 MG EC tablet  Take 81 mg by mouth every morning.     budesonide-formoterol 160-4.5 MCG/ACT inhaler  Commonly known as:  SYMBICORT  Inhale 2 puffs into the lungs 2 (two) times daily as needed (for shortness of breath).     calcitRIOL 0.25 MCG capsule  Commonly known as:  ROCALTROL     clopidogrel 75 MG tablet  Commonly known as:  PLAVIX  Take 75 mg by mouth every morning.     ezetimibe 10 MG tablet  Commonly known as:  ZETIA  Take 10 mg by mouth every morning.     famotidine 20 MG tablet  Commonly known as:  PEPCID  Take 1 tablet (20 mg total) by mouth 2 (two) times  daily.  ferrous sulfate 325 (65 FE) MG tablet  Take 325 mg by mouth daily.     HYDROcodone-acetaminophen 5-500 MG per tablet  Commonly known as:  VICODIN     insulin aspart 100 UNIT/ML injection  Commonly known as:  novoLOG  Inject 42 Units into the skin 3 (three) times daily with meals.     insulin glargine 100 UNIT/ML injection  Commonly known as:  LANTUS  Inject 74 Units into the skin at bedtime.     isosorbide mononitrate 60 MG 24 hr tablet  Commonly known as:  IMDUR  TAKE 1 TABLET ONCE DAILY.     loratadine 10 MG tablet  Commonly known as:  CLARITIN  Take 10 mg by mouth daily as needed for allergies.     montelukast 10 MG tablet  Commonly known as:  SINGULAIR     nebivolol 5 MG tablet  Commonly known as:  BYSTOLIC  Take 1 tablet in the morning and 1/2 tablet in the PM     NITROSTAT 0.4 MG SL tablet  Generic drug:  nitroGLYCERIN  Take 1 tablet by mouth every 5 (five) minutes x 3 doses as needed. For chest pains     OXYGEN-HELIUM IN  Inhale into the lungs. 2 liters continuous     RANEXA 1000 MG SR tablet  Generic drug:  ranolazine  TAKE (1) TABLET TWICE DAILY.     rosuvastatin 20 MG tablet  Commonly known as:  CRESTOR  Take 20 mg by mouth every evening.     spironolactone 25 MG tablet  Commonly known as:  ALDACTONE  Take 1/2 tablet daily     torsemide 20 MG tablet  Commonly known as:  DEMADEX  Take 2 tablets twice daily for 2 days then 2 tablets in the morning and 1 in the afternoon        Allergies:  Allergies  Allergen Reactions  . Morphine Shortness Of Breath and Swelling  . Penicillins Shortness Of Breath and Swelling    Past Medical History  Diagnosis Date  . Sleep apnea     on C-pap  . Occult blood in stools   . Myalgia   . Abnormal PFT   . DM (diabetes mellitus)     type II uncontrolled  . Palpitations   . Microalbuminuria   . Microcytic anemia   . DJD (degenerative joint disease)   . Constipation   . CAD (coronary artery  disease) 2009    5 stents- #3 in RCA, #1 each in LAD and AVG  . Osteoarthritis   . Low back pain   . HTN (hypertension)   . Hyperlipidemia   . GERD (gastroesophageal reflux disease)   . Allergic rhinitis   . Personal history of colonic polyps   . HOH (hard of hearing)   . MI (myocardial infarction) 36  . Glaucoma   . Diastolic dysfunction, left ventricle 09/12/12  . Cancer     uterine  . Acute renal failure due to procedure 09/18/2012  . CKD (chronic kidney disease) stage 3, GFR 30-59 ml/min 09/18/2012  . Heart block 02/2013    transient    Past Surgical History  Procedure Laterality Date  . Ectopic pregnancy surgery    . Foot surgery      left and right for callous  . Cervical discectomy      L5 left/hemilminectomy  . Colonoscopy  09/2006    int hemmorhoids, COMPLICATED BY CARDIOPULMONARY COMPLICATIONS  . Abdominal hysterectomy      uterine  cancer  . Polypectomy  10/22/2011    internal hemorrhoids/sessile polyp  . Coronary angioplasty  1/99, 1/07, 1/08, 4/09    5 cardiac stents total    Family History  Problem Relation Age of Onset  . Anesthesia problems Neg Hx   . Hypotension Neg Hx   . Malignant hyperthermia Neg Hx   . Pseudochol deficiency Neg Hx     Social History:  reports that she quit smoking about 7 years ago. Her smoking use included Cigarettes. She smoked 0.40 packs per day. She does not have any smokeless tobacco history on file. She reports that she does not drink alcohol or use illicit drugs.  Review of Systems  Hyperlipidemia:   Currently on Crestor  20 mg and Zetia.   Lab Results  Component Value Date   CHOL 191 12/18/2013   HDL 39* 12/18/2013   LDLCALC 115* 12/18/2013   LDLDIRECT 118.4 08/13/2013   TRIG 186* 12/18/2013   CHOLHDL 4.9 12/18/2013   Cardiovascular ROS: She has a history of CAD Also has history of asthma/COPD She has a history of small goiter   Examination:   BP 122/52  Pulse 70  Temp(Src) 98.2 F (36.8 C)  Resp 16  Ht 5'  7" (1.702 m)  Wt 233 lb 3.2 oz (105.779 kg)  BMI 36.52 kg/m2  SpO2 94%  Body mass index is 36.52 kg/(m^2).   No edema  Assesment:   Diabetes type 2, uncontrolled   The patient's diabetes  is still overall still poorly controlled  with home readings averaging 272 and an acceptably high A1c of 11.5% .  Has a few better readings very sporadically She probably did not benefit significantly from Victoza and appears to be quite insulin resistant with requiring large doses of both basal and bolus insulin. Not a candidate for Actos because of her tendency to edema and ? CHF   Her diet appears to be fairly good, she is not able to exercise much more lose weight.  HYPERLIPIDEMIA:  LDL is still relatively high despite Crestor and Zetia  PLAN:   Split Lantus to  twice a day to see if it will help her glycemic control as blood sugars are relatively high with the morning injection  Increase dose of NovoLog and she will take 50 units with her main meals Trial of Invokana 100 mg daily since her renal function is fairly good She was explained how this works, doses, benefits, possible side effects and given co-pay card as well as sample  Will need short-term followup  She will call if blood sugars are not improving or if she is having any low sugars Adjust insulin further on followup visit Consider adding metformin back on the next visit since renal function is reasonably good  Counseling time over 50% of today's 25 minute visit  Aliegha Paullin 01/11/2014, 4:14 PM

## 2014-01-13 ENCOUNTER — Telehealth: Payer: Self-pay | Admitting: Obstetrics and Gynecology

## 2014-01-15 DIAGNOSIS — Z5189 Encounter for other specified aftercare: Secondary | ICD-10-CM | POA: Diagnosis not present

## 2014-01-15 DIAGNOSIS — E119 Type 2 diabetes mellitus without complications: Secondary | ICD-10-CM | POA: Diagnosis not present

## 2014-01-15 DIAGNOSIS — M545 Low back pain, unspecified: Secondary | ICD-10-CM | POA: Diagnosis not present

## 2014-01-15 DIAGNOSIS — M6281 Muscle weakness (generalized): Secondary | ICD-10-CM | POA: Diagnosis not present

## 2014-01-15 DIAGNOSIS — M25559 Pain in unspecified hip: Secondary | ICD-10-CM | POA: Diagnosis not present

## 2014-01-15 DIAGNOSIS — I129 Hypertensive chronic kidney disease with stage 1 through stage 4 chronic kidney disease, or unspecified chronic kidney disease: Secondary | ICD-10-CM | POA: Diagnosis not present

## 2014-01-15 NOTE — Telephone Encounter (Signed)
Pt states started bleeding again only happens when pt urinates, appt made with Dr. Glo Herring for Monday, January 18, 2014 at 10:15 am.

## 2014-01-18 ENCOUNTER — Encounter: Payer: Self-pay | Admitting: Obstetrics and Gynecology

## 2014-01-18 ENCOUNTER — Ambulatory Visit (INDEPENDENT_AMBULATORY_CARE_PROVIDER_SITE_OTHER): Payer: Medicare Other | Admitting: Obstetrics and Gynecology

## 2014-01-18 ENCOUNTER — Other Ambulatory Visit (HOSPITAL_COMMUNITY)
Admission: RE | Admit: 2014-01-18 | Discharge: 2014-01-18 | Disposition: A | Discharge status: Home / Self Care | Payer: Medicare Other | Source: Ambulatory Visit | Attending: Obstetrics and Gynecology | Admitting: Obstetrics and Gynecology

## 2014-01-18 VITALS — BP 156/70 | Ht 67.0 in | Wt 236.2 lb

## 2014-01-18 DIAGNOSIS — Z0000 Encounter for general adult medical examination without abnormal findings: Secondary | ICD-10-CM

## 2014-01-18 DIAGNOSIS — B373 Candidiasis of vulva and vagina: Secondary | ICD-10-CM | POA: Diagnosis not present

## 2014-01-18 DIAGNOSIS — Z124 Encounter for screening for malignant neoplasm of cervix: Secondary | ICD-10-CM | POA: Insufficient documentation

## 2014-01-18 DIAGNOSIS — I509 Heart failure, unspecified: Secondary | ICD-10-CM

## 2014-01-18 DIAGNOSIS — R319 Hematuria, unspecified: Secondary | ICD-10-CM | POA: Diagnosis not present

## 2014-01-18 DIAGNOSIS — N950 Postmenopausal bleeding: Secondary | ICD-10-CM | POA: Insufficient documentation

## 2014-01-18 DIAGNOSIS — N898 Other specified noninflammatory disorders of vagina: Secondary | ICD-10-CM

## 2014-01-18 DIAGNOSIS — N939 Abnormal uterine and vaginal bleeding, unspecified: Secondary | ICD-10-CM

## 2014-01-18 DIAGNOSIS — Z1151 Encounter for screening for human papillomavirus (HPV): Secondary | ICD-10-CM | POA: Insufficient documentation

## 2014-01-18 DIAGNOSIS — B3731 Acute candidiasis of vulva and vagina: Secondary | ICD-10-CM

## 2014-01-18 LAB — POCT URINALYSIS DIPSTICK
Glucose, UA: NEGATIVE
KETONES UA: NEGATIVE
Leukocytes, UA: NEGATIVE
NITRITE UA: NEGATIVE
Protein, UA: 300

## 2014-01-18 LAB — POCT WET PREP (WET MOUNT): CLUE CELLS WET PREP WHIFF POC: NEGATIVE

## 2014-01-18 MED ORDER — NYSTATIN-TRIAMCINOLONE 100000-0.1 UNIT/GM-% EX OINT: 1.0000 "application " | TOPICAL_OINTMENT | Freq: Two times a day (BID) | CUTANEOUS | Status: DC

## 2014-01-18 MED ORDER — FLUCONAZOLE 150 MG PO TABS
150.0000 mg | ORAL_TABLET | Freq: Once | ORAL | Status: DC
Start: 2014-01-18 — End: 2014-03-05

## 2014-01-18 NOTE — Progress Notes (Signed)
This chart was scribed by Ludger Nutting, Medical Scribe, for Dr. Mallory Shirk on 01/18/14 at 10:48 AM. This chart was reviewed by Dr. Mallory Shirk and is accurate.   [facility] Visit  Patient name: Krystal [name] MRN [id number]  Date of birth: February 26, 1936  CC & HPI:  Krystal [name] is a 78 y.o. female presenting today for possible vaginal bleeding. She reports the bleeding is similar to menstrual bleeding but reports noticing clumps of blood fall when urinating.  Pt has had abdominal hysterectomy after uterine cancer. She also reports vaginal discharge and has been experiencing a foul odor.    Pt reports having increased leg swelling despite taking fluid pills. She reports increased fatigue with exertion.   ROS:  Negative except listed above.   Pertinent History Reviewed:  Medical & Surgical Hx:  Reviewed: Significant for  Past Medical History  Diagnosis Date  . Sleep apnea     on C-pap  . Occult blood in stools   . Myalgia   . Abnormal PFT   . DM (diabetes mellitus)     type II uncontrolled  . Palpitations   . Microalbuminuria   . Microcytic anemia   . DJD (degenerative joint disease)   . Constipation   . CAD (coronary artery disease) 2009    5 stents- #3 in RCA, #1 each in LAD and AVG  . Osteoarthritis   . Low back pain   . HTN (hypertension)   . Hyperlipidemia   . GERD (gastroesophageal reflux disease)   . Allergic rhinitis   . Personal history of colonic polyps   . HOH (hard of hearing)   . MI (myocardial infarction) 1  . Glaucoma   . Diastolic dysfunction, left ventricle 09/12/12  . Cancer     uterine  . Acute renal failure due to procedure 09/18/2012  . CKD (chronic kidney disease) stage 3, GFR 30-59 ml/min 09/18/2012  . Heart block 02/2013    transient    Past Surgical History  Procedure Laterality Date  . Ectopic pregnancy surgery    . Foot surgery      left and right for callous  . Cervical discectomy      L5 left/hemilminectomy  .  Colonoscopy  09/2006    int hemmorhoids, COMPLICATED BY CARDIOPULMONARY COMPLICATIONS  . Abdominal hysterectomy      uterine cancer  . Polypectomy  10/22/2011    internal hemorrhoids/sessile polyp  . Coronary angioplasty  1/99, 1/07, 1/08, 4/09    5 cardiac stents total    Medications: Reviewed & Updated - see associated section Social History: Reviewed -  reports that she quit smoking about 7 years ago. Her smoking use included Cigarettes. She smoked 0.40 packs per day. She does not have any smokeless tobacco history on file.  Objective Findings:  Vitals: BP 156/70  Ht 5\' 7"  (1.702 m)  Wt 236 lb 3.2 oz (107.14 kg)  BMI 36.99 kg/m2  Physical Examination: General appearance - alert, well appearing, and in no distress and oriented to person, place, and time Heart - normal rate, regular rhythm, normal S1, S2, no murmurs, rubs, clicks or gallops Pelvic - VULVA: normal appearing vulva with no masses, tenderness or lesions, Erythematous with white discharge, suspicious for yeast. No lesions. VAGINA: atrophic, no lesions. White discharge present,  CERVIX: surgically absent,  UTERUS: surgically absent ADNEXA: normal adnexa in size, nontender and no masses    Assessment & Plan:    A:   1.Monilial Vaginitis 2.Status  post grade 1 CA endometrium s/p TAHBSO 04/2010 3. CHF, known CAD 4. Vaginal bleeding by history    P: 1. Rx diflucan and mytrex  2. UA, CNS sent 3. Pap done 4. F/U 4WK FOR pROOF of cure, reexam

## 2014-01-18 NOTE — Patient Instructions (Signed)
Monilial Vaginitis  Vaginitis in a soreness, swelling and redness (inflammation) of the vagina and vulva. Monilial vaginitis is not a sexually transmitted infection.  CAUSES   Yeast vaginitis is caused by yeast (candida) that is normally found in your vagina. With a yeast infection, the candida has overgrown in number to a point that upsets the chemical balance.  SYMPTOMS   · White, thick vaginal discharge.  · Swelling, itching, redness and irritation of the vagina and possibly the lips of the vagina (vulva).  · Burning or painful urination.  · Painful intercourse.  DIAGNOSIS   Things that may contribute to monilial vaginitis are:  · Postmenopausal and virginal states.  · Pregnancy.  · Infections.  · Being tired, sick or stressed, especially if you had monilial vaginitis in the past.  · Diabetes. Good control will help lower the chance.  · Birth control pills.  · Tight fitting garments.  · Using bubble bath, feminine sprays, douches or deodorant tampons.  · Taking certain medications that kill germs (antibiotics).  · Sporadic recurrence can occur if you become ill.  TREATMENT   Your caregiver will give you medication.  · There are several kinds of anti monilial vaginal creams and suppositories specific for monilial vaginitis. For recurrent yeast infections, use a suppository or cream in the vagina 2 times a week, or as directed.  · Anti-monilial or steroid cream for the itching or irritation of the vulva may also be used. Get your caregiver's permission.  · Painting the vagina with methylene blue solution may help if the monilial cream does not work.  · Eating yogurt may help prevent monilial vaginitis.  HOME CARE INSTRUCTIONS   · Finish all medication as prescribed.  · Do not have sex until treatment is completed or after your caregiver tells you it is okay.  · Take warm sitz baths.  · Do not douche.  · Do not use tampons, especially scented ones.  · Wear cotton underwear.  · Avoid tight pants and panty  hose.  · Tell your sexual partner that you have a yeast infection. They should go to their caregiver if they have symptoms such as mild rash or itching.  · Your sexual partner should be treated as well if your infection is difficult to eliminate.  · Practice safer sex. Use condoms.  · Some vaginal medications cause latex condoms to fail. Vaginal medications that harm condoms are:  · Cleocin cream.  · Butoconazole (Femstat®).  · Terconazole (Terazol®) vaginal suppository.  · Miconazole (Monistat®) (may be purchased over the counter).  SEEK MEDICAL CARE IF:   · You have a temperature by mouth above 102° F (38.9° C).  · The infection is getting worse after 2 days of treatment.  · The infection is not getting better after 3 days of treatment.  · You develop blisters in or around your vagina.  · You develop vaginal bleeding, and it is not your menstrual period.  · You have pain when you urinate.  · You develop intestinal problems.  · You have pain with sexual intercourse.  Document Released: 08/15/2005 Document Revised: 01/28/2012 Document Reviewed: 04/29/2009  [employer]® Patient Information ©2014 [employer].

## 2014-01-19 LAB — URINE CULTURE: Colony Count: >=100000

## 2014-01-21 DIAGNOSIS — I129 Hypertensive chronic kidney disease with stage 1 through stage 4 chronic kidney disease, or unspecified chronic kidney disease: Secondary | ICD-10-CM | POA: Diagnosis not present

## 2014-01-21 DIAGNOSIS — M545 Low back pain, unspecified: Secondary | ICD-10-CM | POA: Diagnosis not present

## 2014-01-21 DIAGNOSIS — M6281 Muscle weakness (generalized): Secondary | ICD-10-CM | POA: Diagnosis not present

## 2014-01-21 DIAGNOSIS — E119 Type 2 diabetes mellitus without complications: Secondary | ICD-10-CM | POA: Diagnosis not present

## 2014-01-21 DIAGNOSIS — M25559 Pain in unspecified hip: Secondary | ICD-10-CM | POA: Diagnosis not present

## 2014-01-21 DIAGNOSIS — Z5189 Encounter for other specified aftercare: Secondary | ICD-10-CM | POA: Diagnosis not present

## 2014-01-22 DIAGNOSIS — I129 Hypertensive chronic kidney disease with stage 1 through stage 4 chronic kidney disease, or unspecified chronic kidney disease: Secondary | ICD-10-CM | POA: Diagnosis not present

## 2014-01-22 DIAGNOSIS — M25559 Pain in unspecified hip: Secondary | ICD-10-CM | POA: Diagnosis not present

## 2014-01-22 DIAGNOSIS — E119 Type 2 diabetes mellitus without complications: Secondary | ICD-10-CM | POA: Diagnosis not present

## 2014-01-22 DIAGNOSIS — Z5189 Encounter for other specified aftercare: Secondary | ICD-10-CM | POA: Diagnosis not present

## 2014-01-22 DIAGNOSIS — M545 Low back pain, unspecified: Secondary | ICD-10-CM | POA: Diagnosis not present

## 2014-01-22 DIAGNOSIS — M6281 Muscle weakness (generalized): Secondary | ICD-10-CM | POA: Diagnosis not present

## 2014-01-28 DIAGNOSIS — I2510 Atherosclerotic heart disease of native coronary artery without angina pectoris: Secondary | ICD-10-CM | POA: Diagnosis not present

## 2014-01-28 DIAGNOSIS — E119 Type 2 diabetes mellitus without complications: Secondary | ICD-10-CM | POA: Diagnosis not present

## 2014-01-28 DIAGNOSIS — R42 Dizziness and giddiness: Secondary | ICD-10-CM | POA: Diagnosis not present

## 2014-02-02 ENCOUNTER — Other Ambulatory Visit: Payer: Self-pay | Admitting: Endocrinology

## 2014-02-05 ENCOUNTER — Ambulatory Visit: Payer: Medicare Other | Admitting: Endocrinology

## 2014-02-08 ENCOUNTER — Ambulatory Visit (INDEPENDENT_AMBULATORY_CARE_PROVIDER_SITE_OTHER): Payer: Medicare Other | Admitting: Cardiovascular Disease

## 2014-02-08 ENCOUNTER — Encounter: Payer: Self-pay | Admitting: Cardiovascular Disease

## 2014-02-08 VITALS — BP 140/72 | HR 68 | Ht 67.0 in | Wt 234.2 lb

## 2014-02-08 DIAGNOSIS — I2510 Atherosclerotic heart disease of native coronary artery without angina pectoris: Secondary | ICD-10-CM | POA: Diagnosis not present

## 2014-02-08 DIAGNOSIS — E669 Obesity, unspecified: Secondary | ICD-10-CM | POA: Diagnosis not present

## 2014-02-08 DIAGNOSIS — R0602 Shortness of breath: Secondary | ICD-10-CM | POA: Diagnosis not present

## 2014-02-08 DIAGNOSIS — I5033 Acute on chronic diastolic (congestive) heart failure: Secondary | ICD-10-CM

## 2014-02-08 DIAGNOSIS — R609 Edema, unspecified: Secondary | ICD-10-CM

## 2014-02-08 DIAGNOSIS — I10 Essential (primary) hypertension: Secondary | ICD-10-CM

## 2014-02-08 DIAGNOSIS — G4730 Sleep apnea, unspecified: Secondary | ICD-10-CM

## 2014-02-08 DIAGNOSIS — R600 Localized edema: Secondary | ICD-10-CM

## 2014-02-08 DIAGNOSIS — E785 Hyperlipidemia, unspecified: Secondary | ICD-10-CM | POA: Diagnosis not present

## 2014-02-08 MED ORDER — NEBIVOLOL HCL 5 MG PO TABS
5.0000 mg | ORAL_TABLET | Freq: Every day | ORAL | Status: DC
Start: 2014-02-08 — End: 2014-02-08

## 2014-02-08 MED ORDER — ISOSORBIDE MONONITRATE ER 60 MG PO TB24: ORAL_TABLET | ORAL | Status: DC

## 2014-02-08 MED ORDER — NEBIVOLOL HCL 5 MG PO TABS: ORAL_TABLET | ORAL | Status: DC

## 2014-02-08 NOTE — Patient Instructions (Signed)
Your physician has recommended you make the following change in your medication: STOP THE AMLODIPINE.  Increase the isosorbide to 1 & 1/2 tablet in the morning and 1/2 tablet in the pm.   Increase the bystolic to 5 mg twice daily.   Your physician recommends that you schedule a follow-up appointment in: 6-8 weeks.

## 2014-02-12 ENCOUNTER — Encounter: Payer: Self-pay | Admitting: Endocrinology

## 2014-02-12 ENCOUNTER — Ambulatory Visit (INDEPENDENT_AMBULATORY_CARE_PROVIDER_SITE_OTHER): Payer: Medicare Other | Admitting: Endocrinology

## 2014-02-12 VITALS — BP 138/70 | HR 77 | Temp 98.2°F | Resp 18 | Ht 67.0 in | Wt 238.0 lb

## 2014-02-12 DIAGNOSIS — IMO0001 Reserved for inherently not codable concepts without codable children: Secondary | ICD-10-CM | POA: Diagnosis not present

## 2014-02-12 DIAGNOSIS — I2510 Atherosclerotic heart disease of native coronary artery without angina pectoris: Secondary | ICD-10-CM

## 2014-02-12 DIAGNOSIS — E1165 Type 2 diabetes mellitus with hyperglycemia: Principal | ICD-10-CM

## 2014-02-12 NOTE — Progress Notes (Signed)
Patient ID: Krystal [name], female   DOB: Apr 17, 1936, 78 y.o.   MRN: [id number]    Reason for Appointment: Diabetes follow-up   History of Present Illness   Diagnosis: date of diagnosis: 1999, Type 2 diabetes mellitus.   PAST history: She has had long-standing diabetes which had been initially treated with oral agents. She was started on premixed insulin possibly in 2005. She also has been on metformin, Januvia, Actos and glipizide in the past. Actos was stopped in 2009 when she had edema. Metformin was stopped because of renal dysfunction Overall her blood sugar control has been poor with A1c consistently over 7% and since about 2009 has been mostly over 10%. INSULIN doses have been progressively increased. However her evening dose has been variable because of the tendency to hypoglycemia overnight; was taking pm dose hs. She had been continued on premixed insulin because of simplicity of dosing.   RECENT history:   On her previous visit she was changed from 74 units of Lantus in the morning to twice a day dosage. She is somewhat confused about how much insulin she is taking was supposed to be taking Lantus 40 twice a day and NovoLog 50 AC 3 times a day She is still taking about 42 units of NovoLog and may take 20 units at bedtime if blood sugar is high Also was given Invokana to start but she did not get the prescription filled. Glucose readings are apparently still not controlled although relatively better in the last week or 2 Blood sugars recently are relatively better in the morning but mostly higher later in the evening Today glucose was 97 fasting when she took extra NovoLog last night; however with extra NovoLog at bedtime she has had occasional low sugars during the night Current insulin dose: LANTUS insulin 40 twice a day; NOVOLOG 42 BID-tid.    Side effects from medications: Edema from Actos.  Monitors blood glucose: Twice a day.  Glucometer: Freestyle.  Blood Glucose  readings:  PREMEAL Breakfast  3 PM  Dinner Bedtime Overall  Glucose range:  75-371   126-342   99-308   159 447   54-447   Mean/median:  235   270   241   300 and   251    Hypoglycemia: Once overnight with glucose 54  Meals: 3 meals per day with breakfast at 10 am (egg, bacon, toast) ; usually minimal lunch; dinner 5-6 pm. The diet that the patient has been following is none, does not always restrict fat intake and will try to eat sugar-free desserts, eating out about twice a week. Juice at times Physical activity: exercise: none, gets short of breath on activity.  Dietician visit: 5/14.  CDE 6/14 Retinal exam: Most recent:12/13.  Complications: nephropathy.   Wt Readings from Last 3 Encounters:  02/12/14 238 lb (107.956 kg)  02/08/14 234 lb 3.2 oz (106.232 kg)  01/18/14 236 lb 3.2 oz (107.14 kg)   Glycemic control:  Lab Results  Component Value Date   HGBA1C 11.5* 12/18/2013   HGBA1C 11.6* 08/13/2013   HGBA1C 10.1* 01/26/2013   Lab Results  Component Value Date   MICROALBUR 73.1* 08/14/2013   LDLCALC 115* 12/18/2013   CREATININE 1.30* 12/18/2013    The microalbumin has been tested , and the result was high         Medication List       This list is accurate as of: 02/12/14  1:58 PM.  Always use your most recent med list.  ADVAIR DISKUS 250-50 MCG/DOSE Aepb  Generic drug:  Fluticasone-Salmeterol  Inhale 1 puff into the lungs 2 (two) times daily.     aspirin 81 MG EC tablet  Take 81 mg by mouth every morning.     budesonide-formoterol 160-4.5 MCG/ACT inhaler  Commonly known as:  SYMBICORT  Inhale 2 puffs into the lungs 2 (two) times daily as needed (for shortness of breath).     calcitRIOL 0.25 MCG capsule  Commonly known as:  ROCALTROL     Canagliflozin 100 MG Tabs  Commonly known as:  INVOKANA  Take 1 tablet (100 mg total) by mouth daily before breakfast.     clopidogrel 75 MG tablet  Commonly known as:  PLAVIX  Take 75 mg by mouth every  morning.     ezetimibe 10 MG tablet  Commonly known as:  ZETIA  Take 10 mg by mouth every morning.     famotidine 20 MG tablet  Commonly known as:  PEPCID  Take 1 tablet (20 mg total) by mouth 2 (two) times daily.     ferrous sulfate 325 (65 FE) MG tablet  Take 325 mg by mouth daily.     fluconazole 150 MG tablet  Commonly known as:  DIFLUCAN  Take 1 tablet (150 mg total) by mouth once.     HYDROcodone-acetaminophen 5-500 MG per tablet  Commonly known as:  VICODIN     NOVOLOG FLEXPEN 100 UNIT/ML FlexPen  Generic drug:  insulin aspart  USE 25 UNITS AT BREAKFAST, 20 UNITS AT LUNCH AND 35 UNITS AT SUPPER, 3 TIMES A DAY SUB-Q     insulin aspart 100 UNIT/ML injection  Commonly known as:  novoLOG  Inject 42 Units into the skin 3 (three) times daily with meals.     LANTUS SOLOSTAR 100 UNIT/ML Solostar Pen  Generic drug:  Insulin Glargine  USE 60 UNITS IN MORNING DAILY SUB-Q.     insulin glargine 100 UNIT/ML injection  Commonly known as:  LANTUS  Inject 74 Units into the skin at bedtime.     isosorbide mononitrate 60 MG 24 hr tablet  Commonly known as:  IMDUR  Take 1 & 1/2 tablet in the morning and 1/2 tablet in the PM.     loratadine 10 MG tablet  Commonly known as:  CLARITIN  Take 10 mg by mouth daily as needed for allergies.     montelukast 10 MG tablet  Commonly known as:  SINGULAIR     nebivolol 5 MG tablet  Commonly known as:  BYSTOLIC  Take 1 tablet twice daily     NITROSTAT 0.4 MG SL tablet  Generic drug:  nitroGLYCERIN  Take 1 tablet by mouth every 5 (five) minutes x 3 doses as needed. For chest pains     nystatin-triamcinolone ointment  Commonly known as:  MYCOLOG  Apply 1 application topically 2 (two) times daily. To affected area.     OXYGEN-HELIUM IN  Inhale into the lungs. 2 liters continuous     RANEXA 1000 MG SR tablet  Generic drug:  ranolazine  TAKE (1) TABLET TWICE DAILY.     rosuvastatin 20 MG tablet  Commonly known as:  CRESTOR  Take 20  mg by mouth every evening.     spironolactone 25 MG tablet  Commonly known as:  ALDACTONE  Take 1/2 tablet daily     torsemide 20 MG tablet  Commonly known as:  DEMADEX  Take 2 tablets twice daily for 2 days then 2 tablets in the  morning and 1 in the afternoon     VICTOZA 18 MG/3ML Sopn  Generic drug:  Liraglutide  Inject into the skin.        Allergies:  Allergies  Allergen Reactions  . Morphine Shortness Of Breath and Swelling  . Penicillins Shortness Of Breath and Swelling    Past Medical History  Diagnosis Date  . Sleep apnea     on C-pap  . Occult blood in stools   . Myalgia   . Abnormal PFT   . DM (diabetes mellitus)     type II uncontrolled  . Palpitations   . Microalbuminuria   . Microcytic anemia   . DJD (degenerative joint disease)   . Constipation   . CAD (coronary artery disease) 2009    5 stents- #3 in RCA, #1 each in LAD and AVG  . Osteoarthritis   . Low back pain   . HTN (hypertension)   . Hyperlipidemia   . GERD (gastroesophageal reflux disease)   . Allergic rhinitis   . Personal history of colonic polyps   . HOH (hard of hearing)   . MI (myocardial infarction) 27  . Glaucoma   . Diastolic dysfunction, left ventricle 09/12/12  . Cancer 04/2010       . Acute renal failure due to procedure 09/18/2012  . CKD (chronic kidney disease) stage 3, GFR 30-59 ml/min 09/18/2012  . Heart block 02/2013    transient    Past Surgical History  Procedure Laterality Date  . Ectopic pregnancy surgery    . Foot surgery      left and right for callous  . Cervical discectomy      L5 left/hemilminectomy  . Colonoscopy  09/2006    int hemmorhoids, COMPLICATED BY CARDIOPULMONARY COMPLICATIONS  . Abdominal hysterectomy  04/2010    uterine cancer,TAHBSO  . Polypectomy  10/22/2011    internal hemorrhoids/sessile polyp  . Coronary angioplasty  1/99, 1/07, 1/08, 4/09    5 cardiac stents total    Family History  Problem Relation Age of Onset  . Anesthesia  problems Neg Hx   . Hypotension Neg Hx   . Malignant hyperthermia Neg Hx   . Pseudochol deficiency Neg Hx     Social History:  reports that she quit smoking about 7 years ago. Her smoking use included Cigarettes. She smoked 0.40 packs per day. She has never used smokeless tobacco. She reports that she does not drink alcohol or use illicit drugs.  Review of Systems  Hyperlipidemia:   Currently on Crestor  20 mg and Zetia. LDL is still relatively high  Lab Results  Component Value Date   CHOL 191 12/18/2013   HDL 39* 12/18/2013   LDLCALC 115* 12/18/2013   LDLDIRECT 118.4 08/13/2013   TRIG 186* 12/18/2013   CHOLHDL 4.9 12/18/2013   Cardiovascular ROS: She has a history of CAD Also has history of asthma/COPD She has a history of small goiter  No recent pedal edema, continues to be on diuretics   Examination:   BP 138/70  Pulse 77  Temp(Src) 98.2 F (36.8 C)  Resp 18  Ht 5\' 7"  (1.702 m)  Wt 238 lb (107.956 kg)  BMI 37.27 kg/m2  SpO2 97%  Body mass index is 37.27 kg/(m^2).   No edema  Assesment:   Diabetes type 2, uncontrolled   The patient's diabetes  is still overall still difficult to control even with large doses of insulin and also Victoza However the last week or 10 days  her blood sugars appear to be relatively better especially in the mornings. She was supposed to be taking Invokana but she did not get the prescription and most likely will benefit from this with better blood sugar control and weight loss Currently her highest readings appear to be late in the evening; again not clear what doses of insulin she is taking at mealtimes Also she is getting low normal or low readings overnight with taking bedtime NovoLog for hyperglycemia Recommendations made today:  LANTUS 40 UNITS AT 11 AM AND 45 units 11 PM   NovoLog 45  at breakfAst  and 50 at supper, NONE  At bedtime  She will go and get the prescription for Invokana to take before breakfast. Discussed timing of  the medication, benefits, possible side effects and need to monitor blood pressure with this. May also need to reduce her diuretics  Call if blood sugars are low at any time  Consider adding metformin back on the next visit since renal function is reasonably good  Balanced meals without large amounts of carbohydrate  Counseling time over 50% of today's 25 minute visit  Allyn Bartelson 02/12/2014, 1:58 PM

## 2014-02-12 NOTE — Patient Instructions (Addendum)
Gray pen LANTUS 40 UNITS AT 11 AM AND 45 units 11 PM   Orange Pen 45  at breakfast and 50 at supper, NONE  At bedtime  Invokana before 11 am daily  Call if sugars get low

## 2014-02-14 ENCOUNTER — Encounter: Payer: Self-pay | Admitting: Cardiovascular Disease

## 2014-02-14 NOTE — Progress Notes (Signed)
Patient ID: Krystal [name], female   DOB: Mar 10, 1936, 78 y.o.   MRN: [id number]        HPI: Krystal [name] is a 78 y.o. female who presents to the office for a followup cardiology evaluation.  Krystal [name] has a history of hypertension, type 2 diabetes mellitus, obstructive sleep apnea on CPAP therapy, and has documented diastolic dysfunction. She has coronary artery disease dating back to the 1990s and has undergone multiple stent procedures involving the proximal LAD,  circumflex, mid right coronary artery, and distal right coronary artery. Her last catheterization in October 2013 did not reveal high-grade restenosis. She did have 80-90% ostial stenosis and a small jailed first septal perforating artery. She had mild calcification at 20-30% narrowing in the stent in the circumflex vessel. The right coronary artery had 3 stents and there was 40-50% in-stent smooth narrowing with 30% narrowing in the distal stent. Ejection fraction was 65%. She did have moderate pulmonary hypertension with a PA pressure 47/20 was found to have acute on chronic diastolic left ventricular failure. Recently, she has noticed weakness at times and dizziness. She also feels at times her heart "jumps ." In the past does note some occasional episodes of chest tightness. She is on oxygen 2 L.  Remotely while she was on high-dose verapamil and Bystolic she did develop transient heart block and these medicines were ultimately discontinued. When I saw her approximately 4 months I  recommended a rechallenge of very low-dose Bystolic at 2.5 mg daily which she tolerated and subsequently have  I further titrated his to 2.5 mg twice a day, and most recently to 5 mg in the am and 2.5 mg in the evening.  She underwent a nuclear perfusion study on 08/14/2013 which was low risk and showed mild breast attenuation but otherwise normal perfusion. Post-stress ejection fraction was 62%.    Krystal [name] continues to note leg swelling. She  continues to experience shortness of breath with activity and fatigue. She denies exertional chest pain She is sleeping with CPAP. She continues to complain of being weak tired and having shortness of breath with activity.  Past Medical History  Diagnosis Date  . Sleep apnea     on C-pap  . Occult blood in stools   . Myalgia   . Abnormal PFT   . DM (diabetes mellitus)     type II uncontrolled  . Palpitations   . Microalbuminuria   . Microcytic anemia   . DJD (degenerative joint disease)   . Constipation   . CAD (coronary artery disease) 2009    5 stents- #3 in RCA, #1 each in LAD and AVG  . Osteoarthritis   . Low back pain   . HTN (hypertension)   . Hyperlipidemia   . GERD (gastroesophageal reflux disease)   . Allergic rhinitis   . Personal history of colonic polyps   . HOH (hard of hearing)   . MI (myocardial infarction) 64  . Glaucoma   . Diastolic dysfunction, left ventricle 09/12/12  . Cancer 04/2010       . Acute renal failure due to procedure 09/18/2012  . CKD (chronic kidney disease) stage 3, GFR 30-59 ml/min 09/18/2012  . Heart block 02/2013    transient    Past Surgical History  Procedure Laterality Date  . Ectopic pregnancy surgery    . Foot surgery      left and right for callous  . Cervical discectomy      L5 left/hemilminectomy  .  Colonoscopy  09/2006    int hemmorhoids, COMPLICATED BY CARDIOPULMONARY COMPLICATIONS  . Abdominal hysterectomy  04/2010    uterine cancer,TAHBSO  . Polypectomy  10/22/2011    internal hemorrhoids/sessile polyp  . Coronary angioplasty  1/99, 1/07, 1/08, 4/09    5 cardiac stents total    Allergies  Allergen Reactions  . Morphine Shortness Of Breath and Swelling  . Penicillins Shortness Of Breath and Swelling    Current Outpatient Prescriptions  Medication Sig Dispense Refill  . aspirin 81 MG EC tablet Take 81 mg by mouth every morning.       . budesonide-formoterol (SYMBICORT) 160-4.5 MCG/ACT inhaler Inhale 2 puffs into  the lungs 2 (two) times daily as needed (for shortness of breath).      . calcitRIOL (ROCALTROL) 0.25 MCG capsule       . Canagliflozin (INVOKANA) 100 MG TABS Take 1 tablet (100 mg total) by mouth daily before breakfast.  30 tablet  1  . clopidogrel (PLAVIX) 75 MG tablet Take 75 mg by mouth every morning.       . ezetimibe (ZETIA) 10 MG tablet Take 10 mg by mouth every morning.       . famotidine (PEPCID) 20 MG tablet Take 1 tablet (20 mg total) by mouth 2 (two) times daily.  60 tablet  9  . ferrous sulfate 325 (65 FE) MG tablet Take 325 mg by mouth daily.       . fluconazole (DIFLUCAN) 150 MG tablet Take 1 tablet (150 mg total) by mouth once.  2 tablet  1  . Fluticasone-Salmeterol (ADVAIR DISKUS) 250-50 MCG/DOSE AEPB Inhale 1 puff into the lungs 2 (two) times daily.       Marland Kitchen HYDROcodone-acetaminophen (VICODIN) 5-500 MG per tablet       . insulin aspart (NOVOLOG) 100 UNIT/ML injection Inject 42 Units into the skin 3 (three) times daily with meals.       . insulin glargine (LANTUS) 100 UNIT/ML injection Inject 74 Units into the skin at bedtime.       Marland Kitchen LANTUS SOLOSTAR 100 UNIT/ML Solostar Pen USE 60 UNITS IN MORNING DAILY SUB-Q.  15 mL  3  . Liraglutide (VICTOZA) 18 MG/3ML SOPN Inject into the skin.      Marland Kitchen loratadine (CLARITIN) 10 MG tablet Take 10 mg by mouth daily as needed for allergies.      . montelukast (SINGULAIR) 10 MG tablet       . NITROSTAT 0.4 MG SL tablet Take 1 tablet by mouth every 5 (five) minutes x 3 doses as needed. For chest pains      . NOVOLOG FLEXPEN 100 UNIT/ML FlexPen USE 25 UNITS AT BREAKFAST, 20 UNITS AT LUNCH AND 35 UNITS AT SUPPER, 3 TIMES A DAY SUB-Q  15 mL  3  . nystatin-triamcinolone ointment (MYCOLOG) Apply 1 application topically 2 (two) times daily. To affected area.  30 g  prn  . OXYGEN-HELIUM IN Inhale into the lungs. 2 liters continuous      . RANEXA 1000 MG SR tablet TAKE (1) TABLET TWICE DAILY.  60 tablet  10  . rosuvastatin (CRESTOR) 20 MG tablet Take 20 mg  by mouth every evening.      Marland Kitchen spironolactone (ALDACTONE) 25 MG tablet Take 1/2 tablet daily  30 tablet  6  . torsemide (DEMADEX) 20 MG tablet Take 2 tablets twice daily for 2 days then 2 tablets in the morning and 1 in the afternoon  94 tablet  6  . isosorbide  mononitrate (IMDUR) 60 MG 24 hr tablet Take 1 & 1/2 tablet in the morning and 1/2 tablet in the PM.  60 tablet  3  . nebivolol (BYSTOLIC) 10 MG tablet Take 10 mg by mouth 2 (two) times daily.       No current facility-administered medications for this visit.    History   Social History  . Marital Status: Widowed    Spouse Name: N/A    Number of Children: N/A  . Years of Education: N/A   Occupational History  . Not on file.   Social History Main Topics  . Smoking status: Former Smoker -- 0.40 packs/day    Types: Cigarettes    Quit date: 10/18/2006  . Smokeless tobacco: Never Used     Comment: quit about 4 yrs ago  . Alcohol Use: No  . Drug Use: No  . Sexual Activity: No   Other Topics Concern  . Not on file   Social History Narrative  . No narrative on file    Family History  Problem Relation Age of Onset  . Anesthesia problems Neg Hx   . Hypotension Neg Hx   . Malignant hyperthermia Neg Hx   . Pseudochol deficiency Neg Hx     ROS is negative for fevers, chills or night sweats. She admits to weight gain. She denies change in vision or hearing. She does note weakness. There is some transient dizziness. She has noticed that her pulse is not quite as fast restarting Bystolic.  She denies any syncope. She still experiences vague chest tightness. She denies bleeding.  She denies nausea vomiting or diarrhea. There is no blood in stool or urine. She is on 2 L of oxygen supplement during the day and uses her CPAP therapy with oxygen nocturnally. She does admit to increased daytime sleepiness. She has not had a download checked in some time with reference to her CPAP unit. She gets her supplies from [employer]. She  denies claudication She complains of ankle swelling. She denies paresthesias Other comprehensive 14 point system review is negative.  PE BP 140/72  Pulse 68  Ht 5\' 7"  (1.702 m)  Wt 234 lb 3.2 oz (106.232 kg)  BMI 36.67 kg/m2  General: Alert, oriented, no distress.  Skin: normal turgor, no rashes HEENT: Normocephalic, atraumatic. Pupils round and reactive; sclera anicteric;no lid lag. No xanthelasmas Nose without nasal septal hypertrophy Mouth/Parynx benign; Mallinpatti scale 3/4 Neck: No JVD, no carotid bruits with normal carotid up stroke. Lungs: clear to ausculatation and percussion; no wheezing or rales Heart: RRR, s1 s2 normal 1/6 systolic murmur; no diastolic murmur. No S3 or S4 gallop. Abdomen: Central adiposity soft, nontender; no hepatosplenomehaly, BS+; abdominal aorta nontender and not dilated by palpation. Pulses 2+ Extremities: Bilateral ankle swelling ; no clubbing cyanosis, Homan's sign negative  Neurologic: grossly nonfocal; cranial nerves grossly normal Psychological: Normal affect.  ECG (independently read by me) normal sinus rhythm at 68 beats per minute. Diagnostic T-wave changes in leads 1 and L. V5 and V6.  Prior ECG ECG: Normal sinus rhythm at 78 beats per minute. Suggestion of left atrial enlargement. Nonspecific ST-T changes. QTc interval 451 ms.  LABS:  BMET    Component Value Date/Time   NA 140 12/18/2013 1058   K 4.0 12/18/2013 1058   CL 104 12/18/2013 1058   CO2 28 12/18/2013 1058   GLUCOSE 192* 12/18/2013 1058   BUN 16 12/18/2013 1058   CREATININE 1.30* 12/18/2013 1058   CREATININE 1.5* 08/13/2013 1502  CALCIUM 8.9 12/18/2013 1058   GFRNONAA 19* 01/26/2013 0550   GFRAA 22* 01/26/2013 0550     Hepatic Function Panel     Component Value Date/Time   PROT 6.3 12/18/2013 1058   ALBUMIN 3.7 12/18/2013 1058   AST 15 12/18/2013 1058   ALT 16 12/18/2013 1058   ALKPHOS 59 12/18/2013 1058   BILITOT 0.4 12/18/2013 1058   BILIDIR <0.1 01/25/2013 2140   IBILI NOT  CALCULATED 01/25/2013 2140     CBC    Component Value Date/Time   WBC 7.7 01/26/2013 0550   RBC 4.55 01/26/2013 0550   HGB 11.0* 01/26/2013 0550   HCT 33.9* 01/26/2013 0550   PLT 203 01/26/2013 0550   MCV 74.5* 01/26/2013 0550   MCH 24.2* 01/26/2013 0550   MCHC 32.4 01/26/2013 0550   RDW 16.2* 01/26/2013 0550   LYMPHSABS 2.2 09/11/2012 1618   MONOABS 0.6 09/11/2012 1618   EOSABS 0.2 09/11/2012 1618   BASOSABS 0.0 09/11/2012 1618     BNP    Component Value Date/Time   PROBNP 828.0* 01/26/2013 0550    Lipid Panel     Component Value Date/Time   CHOL 191 12/18/2013 1058   TRIG 186* 12/18/2013 1058   HDL 39* 12/18/2013 1058   CHOLHDL 4.9 12/18/2013 1058   VLDL 37 12/18/2013 1058   LDLCALC 115* 12/18/2013 1058     RADIOLOGY: No results found.    ASSESSMENT AND PLAN:  Krystal [name] has established coronary disease dating back to the 1990s and has undergone intervention with stenting to her proximal LAD, AV groove circumflex, mid and distal right coronary arteries. Remotely, she had developed transient heart block when she was on high-dose verapamil and Bystolic. She has tolerated resumption of Bystolic with improvement in her subsequent fast heartbeat. She continues to have difficulty with leg swelling. For this reason, recommend she discontinue amlodipine which undoubtedly is contributing to this. I will further titrate her isosorbide mononitrate to 90 mg in the morning and she will take 30 mg at bedtime. Also titrate her diastolic to 5 mg twice a day. Again discussed importance of weight loss. Her body mass index is 36.7 compatible with moderate obesity. She continues to use her CPAP with 100% compliance. I will need to be obtained to verify adequate pressures and optimal treatment.  I will see her in 2 months for followup evaluation or sooner if problems arise.  Troy Sine, MD, [facility]  02/14/2014 8:41 PM

## 2014-02-17 ENCOUNTER — Ambulatory Visit: Payer: Medicare Other | Admitting: Obstetrics and Gynecology

## 2014-02-23 DIAGNOSIS — M25559 Pain in unspecified hip: Secondary | ICD-10-CM | POA: Diagnosis not present

## 2014-02-23 DIAGNOSIS — M545 Low back pain, unspecified: Secondary | ICD-10-CM | POA: Diagnosis not present

## 2014-02-23 DIAGNOSIS — M6281 Muscle weakness (generalized): Secondary | ICD-10-CM | POA: Diagnosis not present

## 2014-02-23 DIAGNOSIS — I129 Hypertensive chronic kidney disease with stage 1 through stage 4 chronic kidney disease, or unspecified chronic kidney disease: Secondary | ICD-10-CM | POA: Diagnosis not present

## 2014-02-23 DIAGNOSIS — Z5189 Encounter for other specified aftercare: Secondary | ICD-10-CM | POA: Diagnosis not present

## 2014-02-23 DIAGNOSIS — E119 Type 2 diabetes mellitus without complications: Secondary | ICD-10-CM | POA: Diagnosis not present

## 2014-02-24 ENCOUNTER — Ambulatory Visit: Payer: Medicare Other | Admitting: Obstetrics and Gynecology

## 2014-02-24 DIAGNOSIS — E669 Obesity, unspecified: Secondary | ICD-10-CM | POA: Diagnosis not present

## 2014-02-24 DIAGNOSIS — M545 Low back pain, unspecified: Secondary | ICD-10-CM | POA: Diagnosis not present

## 2014-02-24 DIAGNOSIS — Z794 Long term (current) use of insulin: Secondary | ICD-10-CM | POA: Diagnosis not present

## 2014-02-24 DIAGNOSIS — Z8719 Personal history of other diseases of the digestive system: Secondary | ICD-10-CM | POA: Diagnosis not present

## 2014-02-24 DIAGNOSIS — IMO0001 Reserved for inherently not codable concepts without codable children: Secondary | ICD-10-CM | POA: Diagnosis not present

## 2014-02-24 DIAGNOSIS — N189 Chronic kidney disease, unspecified: Secondary | ICD-10-CM | POA: Diagnosis not present

## 2014-02-24 DIAGNOSIS — I129 Hypertensive chronic kidney disease with stage 1 through stage 4 chronic kidney disease, or unspecified chronic kidney disease: Secondary | ICD-10-CM | POA: Diagnosis not present

## 2014-02-24 DIAGNOSIS — Z9981 Dependence on supplemental oxygen: Secondary | ICD-10-CM | POA: Diagnosis not present

## 2014-02-24 DIAGNOSIS — J449 Chronic obstructive pulmonary disease, unspecified: Secondary | ICD-10-CM | POA: Diagnosis not present

## 2014-02-24 DIAGNOSIS — I2510 Atherosclerotic heart disease of native coronary artery without angina pectoris: Secondary | ICD-10-CM | POA: Diagnosis not present

## 2014-02-24 DIAGNOSIS — M25559 Pain in unspecified hip: Secondary | ICD-10-CM | POA: Diagnosis not present

## 2014-02-24 DIAGNOSIS — E119 Type 2 diabetes mellitus without complications: Secondary | ICD-10-CM | POA: Diagnosis not present

## 2014-03-03 ENCOUNTER — Ambulatory Visit (INDEPENDENT_AMBULATORY_CARE_PROVIDER_SITE_OTHER): Payer: Medicare Other | Admitting: Obstetrics and Gynecology

## 2014-03-03 ENCOUNTER — Encounter: Payer: Self-pay | Admitting: Obstetrics and Gynecology

## 2014-03-03 VITALS — BP 120/74 | Ht 67.0 in | Wt 242.0 lb

## 2014-03-03 DIAGNOSIS — M25559 Pain in unspecified hip: Secondary | ICD-10-CM | POA: Diagnosis not present

## 2014-03-03 DIAGNOSIS — I2510 Atherosclerotic heart disease of native coronary artery without angina pectoris: Secondary | ICD-10-CM | POA: Diagnosis not present

## 2014-03-03 DIAGNOSIS — E119 Type 2 diabetes mellitus without complications: Secondary | ICD-10-CM | POA: Diagnosis not present

## 2014-03-03 DIAGNOSIS — B373 Candidiasis of vulva and vagina: Secondary | ICD-10-CM | POA: Diagnosis not present

## 2014-03-03 DIAGNOSIS — IMO0001 Reserved for inherently not codable concepts without codable children: Secondary | ICD-10-CM | POA: Diagnosis not present

## 2014-03-03 DIAGNOSIS — B3731 Acute candidiasis of vulva and vagina: Secondary | ICD-10-CM

## 2014-03-03 DIAGNOSIS — I129 Hypertensive chronic kidney disease with stage 1 through stage 4 chronic kidney disease, or unspecified chronic kidney disease: Secondary | ICD-10-CM | POA: Diagnosis not present

## 2014-03-03 DIAGNOSIS — M545 Low back pain, unspecified: Secondary | ICD-10-CM | POA: Diagnosis not present

## 2014-03-03 LAB — POCT WET PREP WITH KOH
BACTERIA WET PREP HPF POC: NEGATIVE
CLUE CELLS WET PREP PER HPF POC: NEGATIVE
EPITHELIAL WET PREP PER HPF POC: NEGATIVE
KOH Prep POC: NEGATIVE
RBC Wet Prep HPF POC: NEGATIVE
TRICHOMONAS UA: NEGATIVE
WBC Wet Prep HPF POC: NEGATIVE
Yeast Wet Prep HPF POC: NEGATIVE

## 2014-03-03 NOTE — Progress Notes (Signed)
This chart was scribed by Jenne Campus, Medical Scribe, for Dr. Mallory Shirk on 03/03/14 at 4:00 PM. This chart was reviewed by Dr. Mallory Shirk and is accurate.   [facility] Visit  Patient name: Krystal [name] MRN [id number]  Date of birth: [birth year]  CC & HPI:  Krystal [name] is a 78 y.o. female presenting today for a f/u for monilial vaginitis. Started on diflucan and mytrex one month ago. Last use of mytrex was 4 days ago. Reports brown vaginal discharge has improved    Reports worsening of SOB. On O2 continuously. States that she has needed to increase it recently without improvement. Called Pulmonologist and left a message.   Also called Dr. Claiborne Billings, Cards, for increased lower extremity swelling x2 months.  ROS:  +increased BLE swelling associated with CHF +increased SOB while on O2 attributed to COPD +improved brown vaginal discharge attributed to monilial vaginitis No other complaints  Pertinent History Reviewed:  Medical & Surgical Hx:  Reviewed: Significant for prior MI, GERD, anemia, CHF, HTN, DM & CKD Medications: Reviewed & Updated - see associated section Social History: Reviewed -  reports that she quit smoking about 7 years ago. Her smoking use included Cigarettes. She smoked 0.40 packs per day. She has never used smokeless tobacco.  Objective Findings:  Vitals: BP 120/74  Ht 5\' 7"  (1.702 m)  Wt 242 lb (109.77 kg)  BMI 37.89 kg/m2 Chaperone present for exam which was performed with pt's permission Physical Examination: General appearance - alert, well appearing, and in no distress, oriented to person, place, and time, overweight and wearing O2 Mental status - alert, oriented to person, place, and time, normal mood, behavior, speech, dress, motor activity, and thought processes Pelvic - VULVA: normal appearing vulva with no masses, tenderness or lesions, VAGINA: normal appearing vagina with normal color and discharge, no lesions, CERVIX: normal  appearing cervix without discharge or lesions KOH wet prep obtained   Assessment & Plan:  A: 1. Resolved monilial vaginitis 2. S/p grade 1 CA endometrium  3. Known CHF  P: 1. F/u PRN 2. Pt has one refill of Diflucan

## 2014-03-05 ENCOUNTER — Emergency Department (HOSPITAL_COMMUNITY): Payer: Medicare Other

## 2014-03-05 ENCOUNTER — Encounter (HOSPITAL_COMMUNITY): Payer: Self-pay | Admitting: Emergency Medicine

## 2014-03-05 ENCOUNTER — Inpatient Hospital Stay (HOSPITAL_COMMUNITY)
Admission: EM | Admit: 2014-03-05 | Discharge: 2014-03-13 | DRG: 291 | Disposition: A | Discharge status: Home Health | Payer: Medicare Other | Attending: Internal Medicine | Admitting: Internal Medicine

## 2014-03-05 DIAGNOSIS — N179 Acute kidney failure, unspecified: Secondary | ICD-10-CM | POA: Diagnosis not present

## 2014-03-05 DIAGNOSIS — Z87891 Personal history of nicotine dependence: Secondary | ICD-10-CM | POA: Diagnosis not present

## 2014-03-05 DIAGNOSIS — R609 Edema, unspecified: Secondary | ICD-10-CM | POA: Diagnosis not present

## 2014-03-05 DIAGNOSIS — G4730 Sleep apnea, unspecified: Secondary | ICD-10-CM | POA: Diagnosis not present

## 2014-03-05 DIAGNOSIS — N183 Chronic kidney disease, stage 3 unspecified: Secondary | ICD-10-CM | POA: Diagnosis present

## 2014-03-05 DIAGNOSIS — Z6836 Body mass index (BMI) 36.0-36.9, adult: Secondary | ICD-10-CM | POA: Diagnosis not present

## 2014-03-05 DIAGNOSIS — M1990 Unspecified osteoarthritis, unspecified site: Secondary | ICD-10-CM | POA: Diagnosis present

## 2014-03-05 DIAGNOSIS — G4733 Obstructive sleep apnea (adult) (pediatric): Secondary | ICD-10-CM | POA: Diagnosis not present

## 2014-03-05 DIAGNOSIS — Z7982 Long term (current) use of aspirin: Secondary | ICD-10-CM | POA: Diagnosis not present

## 2014-03-05 DIAGNOSIS — J9620 Acute and chronic respiratory failure, unspecified whether with hypoxia or hypercapnia: Secondary | ICD-10-CM | POA: Diagnosis present

## 2014-03-05 DIAGNOSIS — E1129 Type 2 diabetes mellitus with other diabetic kidney complication: Secondary | ICD-10-CM | POA: Diagnosis not present

## 2014-03-05 DIAGNOSIS — R42 Dizziness and giddiness: Secondary | ICD-10-CM

## 2014-03-05 DIAGNOSIS — I2510 Atherosclerotic heart disease of native coronary artery without angina pectoris: Secondary | ICD-10-CM | POA: Diagnosis present

## 2014-03-05 DIAGNOSIS — J4489 Other specified chronic obstructive pulmonary disease: Secondary | ICD-10-CM | POA: Diagnosis present

## 2014-03-05 DIAGNOSIS — I252 Old myocardial infarction: Secondary | ICD-10-CM | POA: Diagnosis not present

## 2014-03-05 DIAGNOSIS — J449 Chronic obstructive pulmonary disease, unspecified: Secondary | ICD-10-CM | POA: Diagnosis not present

## 2014-03-05 DIAGNOSIS — Z9981 Dependence on supplemental oxygen: Secondary | ICD-10-CM

## 2014-03-05 DIAGNOSIS — H409 Unspecified glaucoma: Secondary | ICD-10-CM | POA: Diagnosis present

## 2014-03-05 DIAGNOSIS — I509 Heart failure, unspecified: Secondary | ICD-10-CM | POA: Diagnosis not present

## 2014-03-05 DIAGNOSIS — Z8542 Personal history of malignant neoplasm of other parts of uterus: Secondary | ICD-10-CM

## 2014-03-05 DIAGNOSIS — K219 Gastro-esophageal reflux disease without esophagitis: Secondary | ICD-10-CM | POA: Diagnosis present

## 2014-03-05 DIAGNOSIS — I059 Rheumatic mitral valve disease, unspecified: Secondary | ICD-10-CM

## 2014-03-05 DIAGNOSIS — Z79899 Other long term (current) drug therapy: Secondary | ICD-10-CM | POA: Diagnosis not present

## 2014-03-05 DIAGNOSIS — Z9119 Patient's noncompliance with other medical treatment and regimen: Secondary | ICD-10-CM | POA: Diagnosis not present

## 2014-03-05 DIAGNOSIS — I10 Essential (primary) hypertension: Secondary | ICD-10-CM | POA: Diagnosis not present

## 2014-03-05 DIAGNOSIS — IMO0001 Reserved for inherently not codable concepts without codable children: Secondary | ICD-10-CM | POA: Diagnosis present

## 2014-03-05 DIAGNOSIS — E782 Mixed hyperlipidemia: Secondary | ICD-10-CM | POA: Diagnosis present

## 2014-03-05 DIAGNOSIS — E669 Obesity, unspecified: Secondary | ICD-10-CM | POA: Diagnosis present

## 2014-03-05 DIAGNOSIS — Z794 Long term (current) use of insulin: Secondary | ICD-10-CM | POA: Diagnosis not present

## 2014-03-05 DIAGNOSIS — R0602 Shortness of breath: Secondary | ICD-10-CM | POA: Diagnosis not present

## 2014-03-05 DIAGNOSIS — E785 Hyperlipidemia, unspecified: Secondary | ICD-10-CM | POA: Diagnosis present

## 2014-03-05 DIAGNOSIS — R0600 Dyspnea, unspecified: Secondary | ICD-10-CM

## 2014-03-05 DIAGNOSIS — I129 Hypertensive chronic kidney disease with stage 1 through stage 4 chronic kidney disease, or unspecified chronic kidney disease: Secondary | ICD-10-CM | POA: Diagnosis not present

## 2014-03-05 DIAGNOSIS — Z91199 Patient's noncompliance with other medical treatment and regimen due to unspecified reason: Secondary | ICD-10-CM

## 2014-03-05 DIAGNOSIS — H9190 Unspecified hearing loss, unspecified ear: Secondary | ICD-10-CM | POA: Diagnosis present

## 2014-03-05 DIAGNOSIS — N19 Unspecified kidney failure: Secondary | ICD-10-CM | POA: Diagnosis not present

## 2014-03-05 DIAGNOSIS — E1165 Type 2 diabetes mellitus with hyperglycemia: Secondary | ICD-10-CM

## 2014-03-05 DIAGNOSIS — I5033 Acute on chronic diastolic (congestive) heart failure: Secondary | ICD-10-CM | POA: Diagnosis not present

## 2014-03-05 DIAGNOSIS — N189 Chronic kidney disease, unspecified: Secondary | ICD-10-CM

## 2014-03-05 DIAGNOSIS — R600 Localized edema: Secondary | ICD-10-CM | POA: Diagnosis present

## 2014-03-05 DIAGNOSIS — Z9861 Coronary angioplasty status: Secondary | ICD-10-CM

## 2014-03-05 DIAGNOSIS — R0609 Other forms of dyspnea: Secondary | ICD-10-CM | POA: Diagnosis not present

## 2014-03-05 LAB — GLUCOSE, CAPILLARY
Glucose-Capillary: 271 mg/dL — ABNORMAL HIGH (ref 70–99)
Glucose-Capillary: 423 mg/dL — ABNORMAL HIGH (ref 70–99)

## 2014-03-05 LAB — URINALYSIS, ROUTINE W REFLEX MICROSCOPIC
Bilirubin Urine: NEGATIVE
Glucose, UA: >1000 mg/dL — AB
KETONES UR: NEGATIVE mg/dL
Leukocytes, UA: NEGATIVE
NITRITE: NEGATIVE
PH: 6 (ref 5.0–8.0)
PROTEIN: 100 mg/dL — AB
Specific Gravity, Urine: 1.025 (ref 1.005–1.030)
Urobilinogen, UA: 0.2 mg/dL (ref 0.0–1.0)

## 2014-03-05 LAB — URINE MICROSCOPIC-ADD ON

## 2014-03-05 LAB — BASIC METABOLIC PANEL
BUN: 16 mg/dL (ref 6–23)
CALCIUM: 9.6 mg/dL (ref 8.4–10.5)
CO2: 28 mEq/L (ref 19–32)
Chloride: 99 mEq/L (ref 96–112)
Creatinine, Ser: 1.49 mg/dL — ABNORMAL HIGH (ref 0.50–1.10)
GFR calc Af Amer: 38 mL/min — ABNORMAL LOW (ref >90)
GFR calc non Af Amer: 33 mL/min — ABNORMAL LOW (ref >90)
GLUCOSE: 387 mg/dL — AB (ref 70–99)
POTASSIUM: 4.2 meq/L (ref 3.7–5.3)
Sodium: 140 mEq/L (ref 137–147)

## 2014-03-05 LAB — CBC WITH DIFFERENTIAL/PLATELET
BASOS PCT: 0 % (ref 0–1)
Basophils Absolute: 0 10*3/uL (ref 0.0–0.1)
EOS ABS: 0.3 10*3/uL (ref 0.0–0.7)
EOS PCT: 4 % (ref 0–5)
HEMATOCRIT: 36.6 % (ref 36.0–46.0)
HEMOGLOBIN: 11.4 g/dL — AB (ref 12.0–15.0)
LYMPHS ABS: 1.9 10*3/uL (ref 0.7–4.0)
Lymphocytes Relative: 27 % (ref 12–46)
MCH: 24.5 pg — AB (ref 26.0–34.0)
MCHC: 31.1 g/dL (ref 30.0–36.0)
MCV: 78.7 fL (ref 78.0–100.0)
MONO ABS: 0.8 10*3/uL (ref 0.1–1.0)
MONOS PCT: 11 % (ref 3–12)
Neutro Abs: 4.2 10*3/uL (ref 1.7–7.7)
Neutrophils Relative %: 58 % (ref 43–77)
Platelets: 240 10*3/uL (ref 150–400)
RBC: 4.65 MIL/uL (ref 3.87–5.11)
RDW: 16.4 % — ABNORMAL HIGH (ref 11.5–15.5)
WBC: 7.2 10*3/uL (ref 4.0–10.5)

## 2014-03-05 LAB — TROPONIN I

## 2014-03-05 LAB — PRO B NATRIURETIC PEPTIDE: Pro B Natriuretic peptide (BNP): 1239 pg/mL — ABNORMAL HIGH (ref 0–450)

## 2014-03-05 MED ORDER — ENOXAPARIN SODIUM 40 MG/0.4ML ~~LOC~~ SOLN
40.0000 mg | SUBCUTANEOUS | Status: DC
  Administered 2014-03-05 – 2014-03-08 (×4): 40 mg via SUBCUTANEOUS
  Filled 2014-03-05 (×4): qty 0.4

## 2014-03-05 MED ORDER — NITROGLYCERIN 0.4 MG SL SUBL: 0.4000 mg | SUBLINGUAL_TABLET | SUBLINGUAL | Status: DC | PRN

## 2014-03-05 MED ORDER — BUDESONIDE-FORMOTEROL FUMARATE 160-4.5 MCG/ACT IN AERO
2.0000 | INHALATION_SPRAY | Freq: Two times a day (BID) | RESPIRATORY_TRACT | Status: DC | PRN
  Filled 2014-03-05: qty 6

## 2014-03-05 MED ORDER — ALUM & MAG HYDROXIDE-SIMETH 200-200-20 MG/5ML PO SUSP: 30.0000 mL | Freq: Four times a day (QID) | ORAL | Status: DC | PRN

## 2014-03-05 MED ORDER — HYDROCODONE-ACETAMINOPHEN 5-325 MG PO TABS
1.0000 | ORAL_TABLET | Freq: Four times a day (QID) | ORAL | Status: DC | PRN
  Administered 2014-03-05 – 2014-03-10 (×7): 1 via ORAL
  Filled 2014-03-05 (×7): qty 1

## 2014-03-05 MED ORDER — ATORVASTATIN CALCIUM 10 MG PO TABS
10.0000 mg | ORAL_TABLET | Freq: Every day | ORAL | Status: DC
  Administered 2014-03-05 – 2014-03-12 (×8): 10 mg via ORAL
  Filled 2014-03-05 (×8): qty 1

## 2014-03-05 MED ORDER — ASPIRIN EC 81 MG PO TBEC
81.0000 mg | DELAYED_RELEASE_TABLET | Freq: Every morning | ORAL | Status: DC
  Administered 2014-03-05 – 2014-03-13 (×9): 81 mg via ORAL
  Filled 2014-03-05 (×11): qty 1

## 2014-03-05 MED ORDER — INSULIN ASPART 100 UNIT/ML ~~LOC~~ SOLN
0.0000 [IU] | Freq: Three times a day (TID) | SUBCUTANEOUS | Status: DC
  Administered 2014-03-05: 20 [IU] via SUBCUTANEOUS
  Administered 2014-03-06 (×2): 4 [IU] via SUBCUTANEOUS
  Administered 2014-03-06: 7 [IU] via SUBCUTANEOUS
  Administered 2014-03-07: 4 [IU] via SUBCUTANEOUS
  Administered 2014-03-07: 7 [IU] via SUBCUTANEOUS
  Administered 2014-03-08: 4 [IU] via SUBCUTANEOUS
  Administered 2014-03-08: 11 [IU] via SUBCUTANEOUS
  Administered 2014-03-08 – 2014-03-09 (×2): 4 [IU] via SUBCUTANEOUS
  Administered 2014-03-09: 11 [IU] via SUBCUTANEOUS
  Administered 2014-03-09 – 2014-03-10 (×2): 4 [IU] via SUBCUTANEOUS
  Administered 2014-03-10: 3 [IU] via SUBCUTANEOUS
  Administered 2014-03-10 – 2014-03-11 (×3): 4 [IU] via SUBCUTANEOUS
  Administered 2014-03-11: 7 [IU] via SUBCUTANEOUS
  Administered 2014-03-12 (×2): 11 [IU] via SUBCUTANEOUS
  Administered 2014-03-12: 4 [IU] via SUBCUTANEOUS
  Administered 2014-03-13: 11 [IU] via SUBCUTANEOUS
  Administered 2014-03-13: 7 [IU] via SUBCUTANEOUS

## 2014-03-05 MED ORDER — ONDANSETRON HCL 4 MG/2ML IJ SOLN: 4.0000 mg | Freq: Four times a day (QID) | INTRAMUSCULAR | Status: DC | PRN

## 2014-03-05 MED ORDER — INSULIN GLARGINE 100 UNIT/ML ~~LOC~~ SOLN
40.0000 [IU] | Freq: Two times a day (BID) | SUBCUTANEOUS | Status: DC
  Administered 2014-03-05 – 2014-03-08 (×6): 40 [IU] via SUBCUTANEOUS
  Filled 2014-03-05 (×9): qty 0.4

## 2014-03-05 MED ORDER — SPIRONOLACTONE 25 MG PO TABS
12.5000 mg | ORAL_TABLET | Freq: Every day | ORAL | Status: DC
  Administered 2014-03-05 – 2014-03-13 (×9): 12.5 mg via ORAL
  Filled 2014-03-05 (×9): qty 1

## 2014-03-05 MED ORDER — FUROSEMIDE 10 MG/ML IJ SOLN
40.0000 mg | Freq: Two times a day (BID) | INTRAMUSCULAR | Status: AC
  Administered 2014-03-06 – 2014-03-07 (×4): 40 mg via INTRAVENOUS
  Filled 2014-03-05 (×4): qty 4

## 2014-03-05 MED ORDER — SODIUM CHLORIDE 0.9 % IV SOLN: INTRAVENOUS | Status: DC

## 2014-03-05 MED ORDER — SODIUM CHLORIDE 0.9 % IJ SOLN: 3.0000 mL | INTRAMUSCULAR | Status: DC | PRN

## 2014-03-05 MED ORDER — INSULIN ASPART 100 UNIT/ML ~~LOC~~ SOLN
0.0000 [IU] | Freq: Every day | SUBCUTANEOUS | Status: DC
  Administered 2014-03-05 – 2014-03-13 (×3): 3 [IU] via SUBCUTANEOUS

## 2014-03-05 MED ORDER — INSULIN ASPART 100 UNIT/ML ~~LOC~~ SOLN
5.0000 [IU] | Freq: Three times a day (TID) | SUBCUTANEOUS | Status: DC
  Administered 2014-03-05 – 2014-03-06 (×2): 5 [IU] via SUBCUTANEOUS

## 2014-03-05 MED ORDER — ACETAMINOPHEN 325 MG PO TABS
650.0000 mg | ORAL_TABLET | Freq: Four times a day (QID) | ORAL | Status: DC | PRN
  Administered 2014-03-08: 650 mg via ORAL
  Filled 2014-03-05: qty 2

## 2014-03-05 MED ORDER — BUDESONIDE-FORMOTEROL FUMARATE 160-4.5 MCG/ACT IN AERO
2.0000 | INHALATION_SPRAY | Freq: Two times a day (BID) | RESPIRATORY_TRACT | Status: DC
  Administered 2014-03-05 – 2014-03-13 (×16): 2 via RESPIRATORY_TRACT
  Filled 2014-03-05: qty 6

## 2014-03-05 MED ORDER — FAMOTIDINE 20 MG PO TABS
20.0000 mg | ORAL_TABLET | Freq: Two times a day (BID) | ORAL | Status: DC
  Administered 2014-03-05 – 2014-03-11 (×13): 20 mg via ORAL
  Filled 2014-03-05 (×13): qty 1

## 2014-03-05 MED ORDER — SODIUM CHLORIDE 0.9 % IJ SOLN
3.0000 mL | Freq: Two times a day (BID) | INTRAMUSCULAR | Status: DC
  Administered 2014-03-05 – 2014-03-06 (×3): 3 mL via INTRAVENOUS
  Administered 2014-03-06: 23:00:00 via INTRAVENOUS
  Administered 2014-03-07 – 2014-03-13 (×8): 3 mL via INTRAVENOUS

## 2014-03-05 MED ORDER — MOMETASONE FURO-FORMOTEROL FUM 100-5 MCG/ACT IN AERO
2.0000 | INHALATION_SPRAY | Freq: Two times a day (BID) | RESPIRATORY_TRACT | Status: DC
  Filled 2014-03-05: qty 8.8

## 2014-03-05 MED ORDER — FUROSEMIDE 10 MG/ML IJ SOLN
40.0000 mg | Freq: Once | INTRAMUSCULAR | Status: AC
  Administered 2014-03-05: 40 mg via INTRAVENOUS
  Filled 2014-03-05: qty 4

## 2014-03-05 MED ORDER — SODIUM CHLORIDE 0.9 % IJ SOLN
3.0000 mL | Freq: Two times a day (BID) | INTRAMUSCULAR | Status: DC
  Administered 2014-03-05 – 2014-03-12 (×7): 3 mL via INTRAVENOUS

## 2014-03-05 MED ORDER — ISOSORBIDE MONONITRATE ER 60 MG PO TB24
30.0000 mg | ORAL_TABLET | Freq: Every day | ORAL | Status: DC
  Administered 2014-03-05 – 2014-03-13 (×8): 30 mg via ORAL
  Filled 2014-03-05 (×10): qty 1

## 2014-03-05 MED ORDER — SODIUM CHLORIDE 0.9 % IV SOLN
250.0000 mL | INTRAVENOUS | Status: DC | PRN
  Administered 2014-03-11: 250 mL via INTRAVENOUS

## 2014-03-05 MED ORDER — ISOSORBIDE MONONITRATE ER 60 MG PO TB24
90.0000 mg | ORAL_TABLET | Freq: Every day | ORAL | Status: DC
  Administered 2014-03-05 – 2014-03-13 (×9): 90 mg via ORAL
  Filled 2014-03-05 (×7): qty 2

## 2014-03-05 MED ORDER — ONDANSETRON HCL 4 MG PO TABS: 4.0000 mg | ORAL_TABLET | Freq: Four times a day (QID) | ORAL | Status: DC | PRN

## 2014-03-05 MED ORDER — CLOPIDOGREL BISULFATE 75 MG PO TABS
75.0000 mg | ORAL_TABLET | Freq: Every morning | ORAL | Status: DC
  Administered 2014-03-05 – 2014-03-13 (×9): 75 mg via ORAL
  Filled 2014-03-05 (×9): qty 1

## 2014-03-05 MED ORDER — LORATADINE 10 MG PO TABS
10.0000 mg | ORAL_TABLET | Freq: Every day | ORAL | Status: DC | PRN
  Administered 2014-03-07: 10 mg via ORAL
  Filled 2014-03-05: qty 1

## 2014-03-05 MED ORDER — FERROUS SULFATE 325 (65 FE) MG PO TABS
325.0000 mg | ORAL_TABLET | Freq: Every day | ORAL | Status: DC
  Administered 2014-03-05 – 2014-03-13 (×9): 325 mg via ORAL
  Filled 2014-03-05 (×9): qty 1

## 2014-03-05 MED ORDER — FUROSEMIDE 10 MG/ML IJ SOLN
40.0000 mg | Freq: Four times a day (QID) | INTRAMUSCULAR | Status: DC
  Administered 2014-03-05: 40 mg via INTRAVENOUS
  Filled 2014-03-05: qty 4

## 2014-03-05 MED ORDER — ACETAMINOPHEN 650 MG RE SUPP: 650.0000 mg | Freq: Four times a day (QID) | RECTAL | Status: DC | PRN

## 2014-03-05 MED ORDER — EZETIMIBE 10 MG PO TABS
10.0000 mg | ORAL_TABLET | Freq: Every morning | ORAL | Status: DC
  Administered 2014-03-05 – 2014-03-13 (×9): 10 mg via ORAL
  Filled 2014-03-05 (×9): qty 1

## 2014-03-05 NOTE — H&P (Signed)
Patient seen, independently examined and chart reviewed. I agree with exam, assessment and plan discussed with Dyanne Carrel, NP.  78 year old woman with history of coronary artery disease with multiple stent procedures, chronic diastolic heart failure with lower extremity edema presented with increasing shortness of breath and lower extremity edema. She has been waking up at night short of breath and has been spending the rest of the night and a chair. She has had increasing lower extremity edema over the last several weeks and becomes very dyspneic on exertion. Her activities quite limited secondary to shortness of breath. She reports compliance with low salt diet and medications. She does report 3-4 episodes of chest pain over the last month. None in the last several days. She does not weigh herself.  She is followed by cardiology and was recently seen in the office 02/08/2014 at which time amlodipine was discontinued secondary to edema and isosorbide mononitrate as well as Bysystolic were titrated. Outpatient diuretic regimen includes 25 mg Aldactone half tablet daily and Demadex 40 mg in the morning and 20 mg in the evening.  Weights   02/08/2014 106 kg  01/18/2014 107 kg  01/11/2014 105 kg  09/10/2013 104 kg  PMH Coronary artery disease with history of multiple stent procedures Chronic diastolic congestive heart failure with chronic lower extremity edema Chronic hypoxic respiratory failure maintained on 2-3 L per min nasal cannula continuously. Chronic kidney disease stage III Hypertension Diabetes mellitus type 2 CKD stage III OSA on CPAP  Objective: Afebrile, hypertensive, stable hypoxemia 3 L. she appears calm and comfortable lying in bed. Grossly normal mood and affect. Speech fluent and appropriate. Cardiovascular regular rate and rhythm. No murmur, rub or gallop. 2+-3+ bilateral lower extremity edema to the knees. Respiratory clear to auscultation bilaterally. No wheezes, rales or  rhonchi. Normal respiratory effort. Abdomen soft, obese, nontender. Skin appears grossly unremarkable. Musculoskeletal appears grossly unremarkable. Neurologic grossly unremarkable. Eyes appear unremarkable. ENT notable for multiple missing teeth.  Basic metabolic panel consistent with chronic kidney disease, appears to be at baseline. Random blood sugar 387. Troponin negative. BNP of 1239. CBC unremarkable. Urinalysis appears insignificant. Urine culture pending. Chest x-ray no acute disease. EKG independently reviewed showed normal sinus rhythm, left atrial enlargement, anterior septal MI, old, lateral T-wave inversion, old. No acute changes.  Medical history, diagnostic testing and current symptomology most suggestive of acute on chronic diastolic congestive heart failure with progressive edema despite compliance with medical therapy and diet by report. However it is curious to note that her weight is without significant change in the last 6 months. She does have evidence of significant volume overload today.   Plan admission for IV diuresis. No signs or symptoms at this point of ACS. No echocardiogram on file since 2007, no more recent echo on file per Dr. Evette Georges office. Therefore obtain echocardiogram. Continue Aldactone and Bystolic.  Further education on weighing self daily at home.   Case management consult for assistance with CPAP at home.  Murray Hodgkins, MD [facility] [phone]

## 2014-03-05 NOTE — H&P (Signed)
[facility] History and Physical  KINDLE STROHMEIER ATF:[id number] DOB: 11/26/1935 DOA: 03/05/2014  Referring physician:  PCP: [name], MD   Chief Complaint: worsening LE edema/worsening sob  HPI: Krystal [name] is a delightful  78 y.o. female with a past medical history that includes CAD status post 5 stents in 2009, hypertension, diastolic dysfunction, chronic kidney disease stage III, anemia, presents to the emergency department with a chief complaint of worsening shortness of breath and worsening lower extremity edema. Initial workup in the emergency department reveals elevated BNP consistent with acute on chronic diastolic heart failure.   Patient reports over the last several days she has experienced worsening dyspnea on exertion and lower extremity edema. Ordinarily she requires 4 pillows to sleep in the last 3 nights she has had to sleep sitting up in the chair. In addition she has noticed worsening dyspnea on exertion and lower extremity edema. She indicates that she turned up her oxygen with no relief. He denies chest pain palpitations nausea vomiting abdominal pain. She denies fever chills headache coughing. She indicates that she has been eating and drinking her normal amount and has been avoiding salt. She states that she saw her cardiologist in March of this year and and he stopped her amlodipine and changed her imdur and bystolic. She reports no improvement in her lower extremity edema with those changes. She has been more active than usual the last 2 days she's had 2 friends die and she has been helping arrange funerals.  Initial workup in the emergency department reveals a BNP of 1239, troponin negative and EKG with normal sinus rhythm possible left atrial enlargement. Creatinine 1.49 serum glucose of 387. Urinalysis with few bacteria many squamas cell, 7-10 WBC  She is hemodynamically stable, afebrile and oxygen saturation 99% on 3 L nasal  In the emergency  department she was given 40 mg of Lasix intravenously.   Review of Systems:  10 point review of systems completed and all systems are negative except as indicated in the history of present illness  Past Medical History  Diagnosis Date  . Sleep apnea     on C-pap  . Occult blood in stools   . Myalgia   . Abnormal PFT   . DM (diabetes mellitus)     type II uncontrolled  . Palpitations   . Microalbuminuria   . Microcytic anemia   . DJD (degenerative joint disease)   . Constipation   . CAD (coronary artery disease) 2009    5 stents- #3 in RCA, #1 each in LAD and AVG  . Osteoarthritis   . Low back pain   . HTN (hypertension)   . Hyperlipidemia   . GERD (gastroesophageal reflux disease)   . Allergic rhinitis   . Personal history of colonic polyps   . HOH (hard of hearing)   . MI (myocardial infarction) 53  . Glaucoma   . Diastolic dysfunction, left ventricle 09/12/12  . Cancer 04/2010       . Acute renal failure due to procedure 09/18/2012  . CKD (chronic kidney disease) stage 3, GFR 30-59 ml/min 09/18/2012  . Heart block 02/2013    transient  . OSA on CPAP   . On home O2     2L N/C continuously   Past Surgical History  Procedure Laterality Date  . Ectopic pregnancy surgery    . Foot surgery      left and right for callous  . Cervical discectomy  L5 left/hemilminectomy  . Colonoscopy  09/2006    int hemmorhoids, COMPLICATED BY CARDIOPULMONARY COMPLICATIONS  . Abdominal hysterectomy  04/2010    uterine cancer,TAHBSO  . Polypectomy  10/22/2011    internal hemorrhoids/sessile polyp  . Coronary angioplasty  1/99, 1/07, 1/08, 4/09    5 cardiac stents total   Social History:  reports that she quit smoking about 7 years ago. Her smoking use included Cigarettes. She smoked 0.40 packs per day. She has never used smokeless tobacco. She reports that she does not drink alcohol or use illicit drugs.  Allergies  Allergen Reactions  . Morphine Shortness Of Breath and  Swelling  . Penicillins Shortness Of Breath and Swelling    Family History  Problem Relation Age of Onset  . Anesthesia problems Neg Hx   . Hypotension Neg Hx   . Malignant hyperthermia Neg Hx   . Pseudochol deficiency Neg Hx      Prior to Admission medications   Medication Sig Start Date End Date Taking? Authorizing Provider  aspirin 81 MG EC tablet Take 81 mg by mouth every morning.    Yes Historical Provider, MD  budesonide-formoterol (SYMBICORT) 160-4.5 MCG/ACT inhaler Inhale 2 puffs into the lungs 2 (two) times daily as needed (for shortness of breath).   Yes Historical Provider, MD  calcitRIOL (ROCALTROL) 0.25 MCG capsule Take 0.25 mcg by mouth daily. Take on Monday, Wednesday, and Friday. 06/12/13  Yes Historical Provider, MD  Canagliflozin (INVOKANA) 100 MG TABS Take 1 tablet (100 mg total) by mouth daily before breakfast. 01/11/14  Yes Elayne Snare, MD  clopidogrel (PLAVIX) 75 MG tablet Take 75 mg by mouth every morning.    Yes Historical Provider, MD  ezetimibe (ZETIA) 10 MG tablet Take 10 mg by mouth every morning.    Yes Historical Provider, MD  famotidine (PEPCID) 20 MG tablet Take 1 tablet (20 mg total) by mouth 2 (two) times daily. 07/22/13  Yes Troy Sine, MD  ferrous sulfate 325 (65 FE) MG tablet Take 325 mg by mouth daily.    Yes Historical Provider, MD  Fluticasone-Salmeterol (ADVAIR DISKUS) 250-50 MCG/DOSE AEPB Inhale 1 puff into the lungs 2 (two) times daily.    Yes Alonza Bogus, MD  HYDROcodone-acetaminophen (VICODIN) 5-500 MG per tablet Take 1 tablet by mouth 2 (two) times daily as needed for pain.  07/10/13  Yes Historical Provider, MD  insulin aspart (NOVOLOG) 100 UNIT/ML injection Inject 45 Units into the skin 2 (two) times daily. 45 units in the morning and 40 units in the evening.   Yes Historical Provider, MD  insulin glargine (LANTUS) 100 UNIT/ML injection Inject 40 Units into the skin 2 (two) times daily.    Yes Historical Provider, MD  isosorbide mononitrate  (IMDUR) 60 MG 24 hr tablet Take 1 & 1/2 tablet in the morning and 1/2 tablet in the PM. 02/08/14  Yes Troy Sine, MD  Liraglutide (VICTOZA) 18 MG/3ML SOPN Inject 3 mLs into the skin daily.    Yes Historical Provider, MD  nebivolol (BYSTOLIC) 10 MG tablet Take 5-15 mg by mouth 2 (two) times daily. 1 & 1/2 tablets in the morning and 1/5 tablet in the evening.   Yes Historical Provider, MD  NITROSTAT 0.4 MG SL tablet Take 1 tablet by mouth every 5 (five) minutes x 3 doses as needed. For chest pains 08/29/11  Yes Historical Provider, MD  nystatin-triamcinolone ointment (MYCOLOG) Apply 1 application topically 2 (two) times daily. To affected area. 01/18/14  Yes John  France Ravens, MD  RANEXA 1000 MG SR tablet TAKE (1) TABLET TWICE DAILY. 10/29/13  Yes Troy Sine, MD  rosuvastatin (CRESTOR) 20 MG tablet Take 20 mg by mouth every evening.   Yes Historical Provider, MD  spironolactone (ALDACTONE) 25 MG tablet Take 1/2 tablet daily 09/10/13  Yes Troy Sine, MD  torsemide (DEMADEX) 20 MG tablet Take 2 tablets twice daily for 2 days then 2 tablets in the morning and 1 in the afternoon 12/28/13  Yes Troy Sine, MD  loratadine (CLARITIN) 10 MG tablet Take 10 mg by mouth daily as needed for allergies.    Historical Provider, MD  OXYGEN-HELIUM IN Inhale into the lungs. 2 liters continuous    Historical Provider, MD   Physical Exam: Filed Vitals:   03/05/14 1245  BP: 185/90  Pulse: 67  Temp:   Resp: 20    BP 185/90  Pulse 67  Temp(Src) 98.1 F (36.7 C) (Oral)  Resp 20  Ht 5\' 7"  (1.702 m)  Wt 105.688 kg (233 lb)  BMI 36.48 kg/m2  SpO2 99%  General:  Obese somewhat uncomfortable appearing Eyes: PERRL, normal lids, irises & conjunctiva ENT: grossly normal hearing, lips & tongue Neck: no LAD, masses or thyromegaly Cardiovascular: Regular rate and rhythm no murmur no gallop lower extremity with 2+ pitting edema bilaterally Telemetry: SR, no arrhythmias  Respiratory: Mild increased work of  breathing with conversation. Breath sounds are distant diffusely faint expiratory wheeze on the right, faint fine crackles bilaterally but greater on the right to mid lobe Abdomen: soft, ntnd obese positive bowel sounds throughout Skin: no rash or induration seen on limited exam Musculoskeletal: grossly normal tone BUE/BLE Psychiatric: grossly normal mood and affect, speech fluent and appropriate Neurologic: grossly non-focal.          Labs on Admission:  Basic Metabolic Panel:  Recent Labs Lab 03/05/14 1025  NA 140  K 4.2  CL 99  CO2 28  GLUCOSE 387*  BUN 16  CREATININE 1.49*  CALCIUM 9.6   Liver Function Tests: No results found for this basename: AST, ALT, ALKPHOS, BILITOT, PROT, ALBUMIN,  in the last 168 hours No results found for this basename: LIPASE, AMYLASE,  in the last 168 hours No results found for this basename: AMMONIA,  in the last 168 hours CBC:  Recent Labs Lab 03/05/14 1025  WBC 7.2  NEUTROABS 4.2  HGB 11.4*  HCT 36.6  MCV 78.7  PLT 240   Cardiac Enzymes:  Recent Labs Lab 03/05/14 1025  TROPONINI <0.30    BNP (last 3 results)  Recent Labs  03/05/14 1025  PROBNP 1239.0*   CBG: No results found for this basename: GLUCAP,  in the last 168 hours  Radiological Exams on Admission: Dg Chest Port 1 View  03/05/2014   CLINICAL DATA:  Shortness of breath for 2 weeks  EXAM: PORTABLE CHEST - 1 VIEW  COMPARISON:  DG CHEST 1V PORT dated 01/25/2013  FINDINGS: Borderline cardiomegaly reidentified. Mild prominence of the interstitial markings is again noted, without focal pulmonary opacity. No pleural effusion. No acute osseous finding.  IMPRESSION: No acute cardiopulmonary process.  Mild cardiomegaly reidentified.   Electronically Signed   By: Conchita Paris M.D.   On: 03/05/2014 10:56    EKG: Independently reviewed normal sinus rhythm  Assessment/Plan Principal Problem:   CHF, acute on chronic:  Admit to telemetry. We'll cycle cardiac enzymes. Will  provide IV Lasix and monitor intake and output. Will obtain daily weights. Chest x-ray  unremarkable, BNP elevated. Home medications include Bystolic, spironolactone, torsemide. Will continue spironolactone for now. Chart review indicates she had a Lexiscan stress test in September 2014 which yielded an LV ejection fraction is 62%. Will not order echo at this time Active Problems:  Leg edema: Likely related to #1. IV Lasix as indicated above. Daily weights. Not sure leg edema  CKD (chronic kidney disease) stage 3, GFR 30-59 ml/min: Current creatinine 1.49. Chart review indicates baseline 1.3-1.5. Will monitor closely given need for IV Lasix.  COPD, chronic O2: Believe this is stable and her dyspnea related to #1. Will continue her home Symbicort and Dulera. Continue oxygen supplementation and monitor oxygen saturation level   CAD, prior LAD/RCA/CFX stents, cath 09/12/12 with patent stents, low risk BNuc 01/14/13:  Patient denies any chest pain or palpitations. Will cycle troponins. Will continue aspirin Plavix Imdur, renexa and Crestor.    HYPERLIPIDEMIA: Continue statin    HYPERTENSION: Systolic blood pressure range 161-185 in the emergency department. Will continue Imdur. She will also be getting IV Lasix, continuing spironolactone as well. Will monitor closely       GERD: Appears stable at baseline. Will continue  Pepcid    SLEEP APNEA, on C-pap: Requested respiratory consult    Code Status: full Family Communication: cousin at bedside Disposition Plan: home when ready  Time spent: 91 minutes  [facility] Pager [phone]

## 2014-03-05 NOTE — Care Management Note (Addendum)
    Page 1 of 2   03/08/2014     2:29:53 PM CARE MANAGEMENT NOTE 03/08/2014  Patient:  Krystal [name], Krystal [name]   Account Number:  [id number]  Date Initiated:  03/05/2014  Documentation initiated by:  Claretha Cooper  Subjective/Objective Assessment:   Pt admitted from home where she lives alone. CM consulted for CPAP issue. Spoke with pt. She got her CPAP from [place] and there is a "broken cup". Pt get O2 from [facility]. Pt gets PT, OT  from [employer].     Action/Plan:   Call placed to [facility] DME rep. Pt will need CPAP repaired and bring an O2 tank for potential weekend DC. MD to order [facility].   Anticipated DC Date:  03/07/2014   Anticipated DC Plan:  [place]  CM consult      [facility] Choice  HOME HEALTH   Choice offered to / List presented to:  C-1 Patient        [place] arranged  HH-1 RN  [place] PT      [place] agency  [place]   Status of service:   Medicare Important Message given?   (If response is "NO", the following Medicare IM given date fields will be blank) Date Medicare IM given:   Date Additional Medicare IM given:    Discharge Disposition:    Per UR Regulation:    If discussed at Long Length of Stay Meetings, dates discussed:    Comments:  03/08/14 Claretha Cooper RN BSN CM O2 tank from [facility] in room for DC. HH with [facility] is set up pending DC  03/05/14 Claretha Cooper RN BSN CM Emma with [facility] checking on CPAP issue. Terrence Dupont states [facility] need a Rx for CPAP supplies with NPI number. Terrence Dupont will try to get an O2 tank here today but if she can not, [facility] will need to be called at DC for weekend delivery.

## 2014-03-05 NOTE — ED Notes (Signed)
Increasing shortness of breath with exertion

## 2014-03-05 NOTE — Progress Notes (Signed)
*  PRELIMINARY RESULTS* Echocardiogram 2D Echocardiogram has been performed.  [name] 03/05/2014, 4:09 PM

## 2014-03-05 NOTE — ED Notes (Signed)
NS not started in ED.

## 2014-03-05 NOTE — ED Provider Notes (Signed)
CSN: [id number]     Arrival date & time 03/05/14  [id number] History   First MD Initiated Contact with Patient 03/05/14 1016     Chief Complaint  Patient presents with  . Shortness of Breath      HPI Pt was seen at 1025. Per pt and her family, c/o gradual onset and worsening of persistent SOB and pedal edema for the past 2 weeks. Pt states her SOB worsens on exertion and when she lays flat. States she was sleeping on "5 pillows" then moved to a chair to sleep due to her SOB. States she has been using her O2 N/C and taking her Demadex as prescribed without relief. Denies fevers, no cough, no CP/palpitations, no abd pain, no N/V/D, no back pain, no calf/LE pain or unilateral swelling.     Past Medical History  Diagnosis Date  . Sleep apnea     on C-pap  . Occult blood in stools   . Myalgia   . Abnormal PFT   . DM (diabetes mellitus)     type II uncontrolled  . Palpitations   . Microalbuminuria   . Microcytic anemia   . DJD (degenerative joint disease)   . Constipation   . CAD (coronary artery disease) 2009    5 stents- #3 in RCA, #1 each in LAD and AVG  . Osteoarthritis   . Low back pain   . HTN (hypertension)   . Hyperlipidemia   . GERD (gastroesophageal reflux disease)   . Allergic rhinitis   . Personal history of colonic polyps   . HOH (hard of hearing)   . MI (myocardial infarction) 61  . Glaucoma   . Diastolic dysfunction, left ventricle 09/12/12  . Cancer 04/2010       . Acute renal failure due to procedure 09/18/2012  . CKD (chronic kidney disease) stage 3, GFR 30-59 ml/min 09/18/2012  . Heart block 02/2013    transient  . OSA on CPAP   . On home O2     2L N/C continuously   Past Surgical History  Procedure Laterality Date  . Ectopic pregnancy surgery    . Foot surgery      left and right for callous  . Cervical discectomy      L5 left/hemilminectomy  . Colonoscopy  09/2006    int hemmorhoids, COMPLICATED BY CARDIOPULMONARY COMPLICATIONS  . Abdominal  hysterectomy  04/2010    uterine cancer,TAHBSO  . Polypectomy  10/22/2011    internal hemorrhoids/sessile polyp  . Coronary angioplasty  1/99, 1/07, 1/08, 4/09    5 cardiac stents total   Family History  Problem Relation Age of Onset  . Anesthesia problems Neg Hx   . Hypotension Neg Hx   . Malignant hyperthermia Neg Hx   . Pseudochol deficiency Neg Hx    History  Substance Use Topics  . Smoking status: Former Smoker -- 0.40 packs/day    Types: Cigarettes    Quit date: 10/18/2006  . Smokeless tobacco: Never Used     Comment: quit about 4 yrs ago  . Alcohol Use: No    Review of Systems ROS: Statement: All systems negative except as marked or noted in the HPI; Constitutional: Negative for fever and chills. ; ; Eyes: Negative for eye pain, redness and discharge. ; ; ENMT: Negative for ear pain, hoarseness, nasal congestion, sinus pressure and sore throat. ; ; Cardiovascular: Negative for chest pain, palpitations, diaphoresis, +dyspnea and peripheral edema. ; ; Respiratory: Negative for cough, wheezing and  stridor. ; ; Gastrointestinal: Negative for nausea, vomiting, diarrhea, abdominal pain, blood in stool, hematemesis, jaundice and rectal bleeding. . ; ; Genitourinary: Negative for dysuria, flank pain and hematuria. ; ; Musculoskeletal: Negative for back pain and neck pain. Negative for swelling and trauma.; ; Skin: Negative for pruritus, rash, abrasions, blisters, bruising and skin lesion.; ; Neuro: Negative for headache, lightheadedness and neck stiffness. Negative for weakness, altered level of consciousness , altered mental status, extremity weakness, paresthesias, involuntary movement, seizure and syncope.      Allergies  Morphine and Penicillins  Home Medications   Prior to Admission medications   Medication Sig Start Date End Date Taking? Authorizing Provider  aspirin 81 MG EC tablet Take 81 mg by mouth every morning.    Yes Historical Provider, MD  budesonide-formoterol  (SYMBICORT) 160-4.5 MCG/ACT inhaler Inhale 2 puffs into the lungs 2 (two) times daily as needed (for shortness of breath).   Yes Historical Provider, MD  calcitRIOL (ROCALTROL) 0.25 MCG capsule Take 0.25 mcg by mouth daily. Take on Monday, Wednesday, and Friday. 06/12/13  Yes Historical Provider, MD  Canagliflozin (INVOKANA) 100 MG TABS Take 1 tablet (100 mg total) by mouth daily before breakfast. 01/11/14  Yes Elayne Snare, MD  clopidogrel (PLAVIX) 75 MG tablet Take 75 mg by mouth every morning.    Yes Historical Provider, MD  ezetimibe (ZETIA) 10 MG tablet Take 10 mg by mouth every morning.    Yes Historical Provider, MD  famotidine (PEPCID) 20 MG tablet Take 1 tablet (20 mg total) by mouth 2 (two) times daily. 07/22/13  Yes Troy Sine, MD  ferrous sulfate 325 (65 FE) MG tablet Take 325 mg by mouth daily.    Yes Historical Provider, MD  Fluticasone-Salmeterol (ADVAIR DISKUS) 250-50 MCG/DOSE AEPB Inhale 1 puff into the lungs 2 (two) times daily.    Yes Alonza Bogus, MD  HYDROcodone-acetaminophen (VICODIN) 5-500 MG per tablet Take 1 tablet by mouth 2 (two) times daily as needed for pain.  07/10/13  Yes Historical Provider, MD  insulin aspart (NOVOLOG) 100 UNIT/ML injection Inject 45 Units into the skin 2 (two) times daily. 45 units in the morning and 40 units in the evening.   Yes Historical Provider, MD  insulin glargine (LANTUS) 100 UNIT/ML injection Inject 40 Units into the skin 2 (two) times daily.    Yes Historical Provider, MD  isosorbide mononitrate (IMDUR) 60 MG 24 hr tablet Take 1 & 1/2 tablet in the morning and 1/2 tablet in the PM. 02/08/14  Yes Troy Sine, MD  Liraglutide (VICTOZA) 18 MG/3ML SOPN Inject 3 mLs into the skin daily.    Yes Historical Provider, MD  nebivolol (BYSTOLIC) 10 MG tablet Take 5-15 mg by mouth 2 (two) times daily. 1 & 1/2 tablets in the morning and 1/5 tablet in the evening.   Yes Historical Provider, MD  NITROSTAT 0.4 MG SL tablet Take 1 tablet by mouth every 5  (five) minutes x 3 doses as needed. For chest pains 08/29/11  Yes Historical Provider, MD  nystatin-triamcinolone ointment (MYCOLOG) Apply 1 application topically 2 (two) times daily. To affected area. 01/18/14  Yes Jonnie Kind, MD  RANEXA 1000 MG SR tablet TAKE (1) TABLET TWICE DAILY. 10/29/13  Yes Troy Sine, MD  rosuvastatin (CRESTOR) 20 MG tablet Take 20 mg by mouth every evening.   Yes Historical Provider, MD  spironolactone (ALDACTONE) 25 MG tablet Take 1/2 tablet daily 09/10/13  Yes Troy Sine, MD  torsemide [facility])  20 MG tablet Take 2 tablets twice daily for 2 days then 2 tablets in the morning and 1 in the afternoon 12/28/13  Yes Troy Sine, MD  loratadine (CLARITIN) 10 MG tablet Take 10 mg by mouth daily as needed for allergies.    Historical Provider, MD  OXYGEN-HELIUM IN Inhale into the lungs. 2 liters continuous    Historical Provider, MD   BP 161/73  Pulse 72  Temp(Src) 98.1 F (36.7 C) (Oral)  Resp 18  Ht 5\' 7"  (1.702 m)  Wt 233 lb (105.688 kg)  BMI 36.48 kg/m2  SpO2 100% Physical Exam 1030: Physical examination:  Nursing notes reviewed; Vital signs and O2 SAT reviewed;  Constitutional: Well developed, Well nourished, Well hydrated, In no acute distress; Head:  Normocephalic, atraumatic; Eyes: EOMI, PERRL, No scleral icterus; ENMT: Mouth and pharynx normal, Mucous membranes moist; Neck: Supple, Full range of motion, No lymphadenopathy; Cardiovascular: Regular rate and rhythm, No gallop; Respiratory: Breath sounds coarse & equal bilaterally, No wheezes.  Speaking full sentences with ease, Normal respiratory effort/excursion; Chest: Nontender, Movement normal; Abdomen: Soft, Nontender, Nondistended, Normal bowel sounds; Genitourinary: No CVA tenderness; Extremities: Pulses normal, No tenderness, +3 pedal edema bilat without calf asymmetry.; Neuro: AA&Ox3, Major CN grossly intact.  Speech clear. No gross focal motor or sensory deficits in extremities.; Skin: Color  normal, Warm, Dry.   ED Course  Procedures     EKG Interpretation   Date/Time:  Friday March 05 2014 10:17:05 EDT Ventricular Rate:  76 PR Interval:  186 QRS Duration: 90 QT Interval:  402 QTC Calculation: 452 R Axis:   -2 Text Interpretation:  Normal sinus rhythm Artifact Possible Left atrial  enlargement Anteroseptal infarct (cited on or before 26-Jan-2013) T wave  abnormality Lateral leads Abnormal ECG When compared with ECG of  26-Jan-2013 07:24, PR interval has decreased Otherwise no significant  change Confirmed by [facility]  MD, Nunzio Cory [id number]) on 03/05/2014 11:03:37  AM      MDM  MDM Reviewed: previous chart, nursing note and vitals Reviewed previous: labs and ECG Interpretation: labs, ECG and x-ray    Results for orders placed during the hospital encounter of 03/05/14  CBC WITH DIFFERENTIAL      Result Value Ref Range   WBC 7.2  4.0 - 10.5 K/uL   RBC 4.65  3.87 - 5.11 MIL/uL   Hemoglobin 11.4 (*) 12.0 - 15.0 g/dL   HCT 36.6  36.0 - 46.0 %   MCV 78.7  78.0 - 100.0 fL   MCH 24.5 (*) 26.0 - 34.0 pg   MCHC 31.1  30.0 - 36.0 g/dL   RDW 16.4 (*) 11.5 - 15.5 %   Platelets 240  150 - 400 K/uL   Neutrophils Relative % 58  43 - 77 %   Neutro Abs 4.2  1.7 - 7.7 K/uL   Lymphocytes Relative 27  12 - 46 %   Lymphs Abs 1.9  0.7 - 4.0 K/uL   Monocytes Relative 11  3 - 12 %   Monocytes Absolute 0.8  0.1 - 1.0 K/uL   Eosinophils Relative 4  0 - 5 %   Eosinophils Absolute 0.3  0.0 - 0.7 K/uL   Basophils Relative 0  0 - 1 %   Basophils Absolute 0.0  0.0 - 0.1 K/uL  BASIC METABOLIC PANEL      Result Value Ref Range   Sodium 140  137 - 147 mEq/L   Potassium 4.2  3.7 - 5.3 mEq/L   Chloride  99  96 - 112 mEq/L   CO2 28  19 - 32 mEq/L   Glucose, Bld 387 (*) 70 - 99 mg/dL   BUN 16  6 - 23 mg/dL   Creatinine, Ser 1.49 (*) 0.50 - 1.10 mg/dL   Calcium 9.6  8.4 - 10.5 mg/dL   GFR calc non Af Amer 33 (*) >90 mL/min   GFR calc Af Amer 38 (*) >90 mL/min  URINALYSIS, ROUTINE  W REFLEX MICROSCOPIC      Result Value Ref Range   Color, Urine YELLOW  YELLOW   APPearance CLEAR  CLEAR   Specific Gravity, Urine 1.025  1.005 - 1.030   pH 6.0  5.0 - 8.0   Glucose, UA >1000 (*) NEGATIVE mg/dL   Hgb urine dipstick SMALL (*) NEGATIVE   Bilirubin Urine NEGATIVE  NEGATIVE   Ketones, ur NEGATIVE  NEGATIVE mg/dL   Protein, ur 100 (*) NEGATIVE mg/dL   Urobilinogen, UA 0.2  0.0 - 1.0 mg/dL   Nitrite NEGATIVE  NEGATIVE   Leukocytes, UA NEGATIVE  NEGATIVE  TROPONIN I      Result Value Ref Range   Troponin I <0.30  <0.30 ng/mL  PRO B NATRIURETIC PEPTIDE      Result Value Ref Range   Pro B Natriuretic peptide (BNP) 1239.0 (*) 0 - 450 pg/mL  URINE MICROSCOPIC-ADD ON      Result Value Ref Range   Squamous Epithelial / LPF MANY (*) RARE   WBC, UA 7-10  <3 WBC/hpf   RBC / HPF 0-2  <3 RBC/hpf   Bacteria, UA FEW (*) RARE   Dg Chest Port 1 View 03/05/2014   CLINICAL DATA:  Shortness of breath for 2 weeks  EXAM: PORTABLE CHEST - 1 VIEW  COMPARISON:  DG CHEST 1V PORT dated 01/25/2013  FINDINGS: Borderline cardiomegaly reidentified. Mild prominence of the interstitial markings is again noted, without focal pulmonary opacity. No pleural effusion. No acute osseous finding.  IMPRESSION: No acute cardiopulmonary process.  Mild cardiomegaly reidentified.   Electronically Signed   By: Conchita Paris M.D.   On: 03/05/2014 10:56    1215:  BNP elevated compared to previous; will dose IV lasix. Dx and testing d/w pt and family.  Questions answered.  Verb understanding, agreeable to admit.  T/C to [facility] Dr. Sarajane Jews, case discussed, including:  HPI, pertinent PM/SHx, VS/PE, dx testing, ED course and treatment:  Agreeable to admit, requests to write temporary orders, obtain tele bed to team 1.   Alfonzo Feller, DO 03/05/14 1600

## 2014-03-06 DIAGNOSIS — G4730 Sleep apnea, unspecified: Secondary | ICD-10-CM

## 2014-03-06 LAB — CBC
HCT: 32.5 % — ABNORMAL LOW (ref 36.0–46.0)
HEMOGLOBIN: 10.1 g/dL — AB (ref 12.0–15.0)
MCH: 24.4 pg — ABNORMAL LOW (ref 26.0–34.0)
MCHC: 31.1 g/dL (ref 30.0–36.0)
MCV: 78.5 fL (ref 78.0–100.0)
PLATELETS: 214 10*3/uL (ref 150–400)
RBC: 4.14 MIL/uL (ref 3.87–5.11)
RDW: 16.2 % — ABNORMAL HIGH (ref 11.5–15.5)
WBC: 6.2 10*3/uL (ref 4.0–10.5)

## 2014-03-06 LAB — BASIC METABOLIC PANEL
BUN: 19 mg/dL (ref 6–23)
CO2: 33 mEq/L — ABNORMAL HIGH (ref 19–32)
CREATININE: 1.56 mg/dL — AB (ref 0.50–1.10)
Calcium: 9 mg/dL (ref 8.4–10.5)
Chloride: 98 mEq/L (ref 96–112)
GFR, EST AFRICAN AMERICAN: 36 mL/min — AB (ref >90)
GFR, EST NON AFRICAN AMERICAN: 31 mL/min — AB (ref >90)
Glucose, Bld: 252 mg/dL — ABNORMAL HIGH (ref 70–99)
POTASSIUM: 3.7 meq/L (ref 3.7–5.3)
Sodium: 140 mEq/L (ref 137–147)

## 2014-03-06 LAB — GLUCOSE, CAPILLARY
Glucose-Capillary: 269 mg/dL — ABNORMAL HIGH (ref 70–99)
Glucose-Capillary: 229 mg/dL — ABNORMAL HIGH (ref 70–99)
Glucose-Capillary: 193 mg/dL — ABNORMAL HIGH (ref 70–99)
Glucose-Capillary: 154 mg/dL — ABNORMAL HIGH (ref 70–99)

## 2014-03-06 LAB — HEMOGLOBIN A1C
Hgb A1c MFr Bld: 10.7 % — ABNORMAL HIGH (ref <5.7)
Mean Plasma Glucose: 260 mg/dL — ABNORMAL HIGH (ref <117)

## 2014-03-06 LAB — TROPONIN I

## 2014-03-06 MED ORDER — INSULIN ASPART 100 UNIT/ML ~~LOC~~ SOLN
10.0000 [IU] | Freq: Three times a day (TID) | SUBCUTANEOUS | Status: DC
  Administered 2014-03-06 – 2014-03-13 (×21): 10 [IU] via SUBCUTANEOUS

## 2014-03-06 MED ORDER — RANOLAZINE ER 500 MG PO TB12
1000.0000 mg | ORAL_TABLET | Freq: Two times a day (BID) | ORAL | Status: DC
  Administered 2014-03-06 – 2014-03-10 (×9): 1000 mg via ORAL
  Filled 2014-03-06 (×13): qty 2

## 2014-03-06 MED ORDER — NEBIVOLOL HCL 2.5 MG PO TABS
5.0000 mg | ORAL_TABLET | Freq: Two times a day (BID) | ORAL | Status: DC
  Administered 2014-03-06 – 2014-03-13 (×15): 5 mg via ORAL
  Filled 2014-03-06 (×6): qty 2
  Filled 2014-03-06: qty 1
  Filled 2014-03-06: qty 2
  Filled 2014-03-06: qty 1
  Filled 2014-03-06 (×2): qty 2
  Filled 2014-03-06: qty 1
  Filled 2014-03-06: qty 2
  Filled 2014-03-06: qty 1
  Filled 2014-03-06 (×2): qty 2
  Filled 2014-03-06: qty 1
  Filled 2014-03-06 (×2): qty 2

## 2014-03-06 NOTE — Progress Notes (Signed)
PROGRESS NOTE  Krystal [name] NFA:[id number] DOB: 28-Nov-1935 DOA: 03/05/2014 PCP: [name], MD  Summary: 78 year old woman with history of coronary artery disease with multiple stent procedures, chronic diastolic heart failure with lower extremity edema presented with increasing shortness of breath and lower extremity edema. She has been waking up at night short of breath and has been spending the rest of the night and a chair. She has had increasing lower extremity edema over the last several weeks and becomes very dyspneic on exertion. Her activities quite limited secondary to shortness of breath. She reports compliance with low salt diet and medications. She does report 3-4 episodes of chest pain over the last month. None in the last several days. She does not weigh herself.  Assessment/Plan: 1. Acute on chronic diastolic congestive heart failure, suspect progressively worsening over time secondary to underlying disease. No history of dietary indiscretion. Reports medical compliance. Seems to respond well to Lasix. Dry weight unknown, but weight 104-107 kg last 6 months. 2. Chronic hypoxic respiratory failure maintained on 2-3 L per min nasal cannula continuously. Stable. 3. History of chronic kidney disease stage III. Stable. 4. History of coronary artery disease with history of multiple stent procedures. Quiescent. Troponins negative. 5. Diabetes mellitus type 2 uncontrolled by hemoglobin A1c 10.7. Blood sugars elevated.  6. OSA on CPAP   Overall appears to be improving. Continue IV diuresis.  Increase meal coverage.  Code Status: full code DVT prophylaxis: Lovenox Family Communication:  Disposition Plan: home  Murray Hodgkins, MD  [facility]  Pager [phone] If 7PM-7AM, please contact night-coverage at [email or web address], password [facility] 03/06/2014, 10:06 AM  LOS: 1 day   Consultants:    Procedures:  2-D echocardiogram: Left ventricular ejection fraction 65-70%.  Normal wall motion. No regional wall motion abnormalities. Grade 1 diastolic dysfunction.  Antibiotics:    HPI/Subjective: Still somewhat short of breath. Decreasing lower extremity edema. Overall feels okay. Tolerated CPAP well last night. No chest pain.  Objective: Filed Vitals:   03/05/14 2106 03/06/14 0202 03/06/14 0521 03/06/14 0709  BP: 151/64 147/50 182/61   Pulse: 68 67 63   Temp: 98.2 F (36.8 C) 97.3 F (36.3 C) 97.7 F (36.5 C)   TempSrc: Oral Oral Oral   Resp: 20 20 20    Height:      Weight:   106.3 kg (234 lb 5.6 oz)   SpO2: 98% 100% 100% 100%    Intake/Output Summary (Last 24 hours) at 03/06/14 1006 Last data filed at 03/06/14 0956  Gross per 24 hour  Intake    840 ml  Output   3050 ml  Net  -2210 ml     Filed Weights   03/05/14 1344 03/05/14 1429 03/06/14 0521  Weight: 113.172 kg (249 lb 8 oz) 106.55 kg (234 lb 14.4 oz) 106.3 kg (234 lb 5.6 oz)    Exam:   Afebrile, vital signs are stable. Stable hypoxia.  Gen. Appears calm and comfortable.  Cardiovascular regular rate and rhythm. No murmur, rub or gallop. Decreased edema, 2+ lower extremities, much improved.  Telemetry sinus rhythm.  Respiratory: Clear to auscultation bilaterally. No wheezes, rales, rhonchi. Normal respiratory effort.  The gastric. Grossly normal mood and affect. Speech fluent and appropriate.  Data Reviewed:  Capillary blood sugars stable.  Troponins negative.  Creatinine stable 1.56.  Hemoglobin stable 10.1.  Weights difficult to assess, according to chart may have gained a kilogram. However this is doubted, -2.2 L since admission. Excellent urine output.  Scheduled Meds: . aspirin  EC  81 mg Oral q morning - 10a  . atorvastatin  10 mg Oral q1800  . budesonide-formoterol  2 puff Inhalation BID  . clopidogrel  75 mg Oral q morning - 10a  . enoxaparin (LOVENOX) injection  40 mg Subcutaneous Q24H  . ezetimibe  10 mg Oral q morning - 10a  . famotidine  20 mg Oral  BID  . ferrous sulfate  325 mg Oral Daily  . furosemide  40 mg Intravenous Q12H  . insulin aspart  0-20 Units Subcutaneous TID WC  . insulin aspart  0-5 Units Subcutaneous QHS  . insulin aspart  5 Units Subcutaneous TID WC  . insulin glargine  40 Units Subcutaneous BID  . isosorbide mononitrate  30 mg Oral Q2000  . isosorbide mononitrate  90 mg Oral Daily  . sodium chloride  3 mL Intravenous Q12H  . sodium chloride  3 mL Intravenous Q12H  . spironolactone  12.5 mg Oral Daily   Continuous Infusions:   Principal Problem:   CHF, acute on chronic Active Problems:   HYPERLIPIDEMIA   HYPERTENSION   CAD, prior LAD/RCA/CFX stents, cath 09/12/12 with patent stents, low risk BNuc 01/14/13   COPD, chronic O2   GERD   SLEEP APNEA, on C-pap   Acute on chronic diastolic heart failure - with SSx of Unstable Angina   CKD (chronic kidney disease) stage 3, GFR 30-59 ml/min   Leg edema   Time spent 20 minutes

## 2014-03-06 NOTE — Progress Notes (Signed)
Utilization review Completed Maguadalupe Lata RN BSN   

## 2014-03-07 LAB — URINE CULTURE: Colony Count: 5000

## 2014-03-07 LAB — BASIC METABOLIC PANEL
BUN: 21 mg/dL (ref 6–23)
CHLORIDE: 94 meq/L — AB (ref 96–112)
CO2: 37 mEq/L — ABNORMAL HIGH (ref 19–32)
Calcium: 9.4 mg/dL (ref 8.4–10.5)
Creatinine, Ser: 1.6 mg/dL — ABNORMAL HIGH (ref 0.50–1.10)
GFR calc non Af Amer: 30 mL/min — ABNORMAL LOW (ref >90)
GFR, EST AFRICAN AMERICAN: 35 mL/min — AB (ref >90)
Glucose, Bld: 324 mg/dL — ABNORMAL HIGH (ref 70–99)
Potassium: 4 mEq/L (ref 3.7–5.3)
SODIUM: 140 meq/L (ref 137–147)

## 2014-03-07 LAB — GLUCOSE, CAPILLARY
GLUCOSE-CAPILLARY: 185 mg/dL — AB (ref 70–99)
Glucose-Capillary: 213 mg/dL — ABNORMAL HIGH (ref 70–99)
Glucose-Capillary: 234 mg/dL — ABNORMAL HIGH (ref 70–99)
Glucose-Capillary: 92 mg/dL (ref 70–99)
Glucose-Capillary: 195 mg/dL — ABNORMAL HIGH (ref 70–99)

## 2014-03-07 LAB — MAGNESIUM: MAGNESIUM: 1.6 mg/dL (ref 1.5–2.5)

## 2014-03-07 MED ORDER — LORATADINE 10 MG PO TABS
10.0000 mg | ORAL_TABLET | Freq: Every day | ORAL | Status: DC
  Administered 2014-03-08 – 2014-03-13 (×6): 10 mg via ORAL
  Filled 2014-03-07 (×7): qty 1

## 2014-03-07 NOTE — Progress Notes (Signed)
PROGRESS NOTE  Krystal [name] FBP:[id number] DOB: 02/16/1936 DOA: 03/05/2014 PCP: [name], MD  Summary: 78 year old woman with history of coronary artery disease with multiple stent procedures, chronic diastolic heart failure with lower extremity edema presented with increasing shortness of breath and lower extremity edema. She has been waking up at night short of breath and has been spending the rest of the night and a chair. She has had increasing lower extremity edema over the last several weeks and becomes very dyspneic on exertion. Her activities quite limited secondary to shortness of breath. She reports compliance with low salt diet and medications. She does report 3-4 episodes of chest pain over the last month. None in the last several days. She does not weigh herself.  Assessment/Plan: 1. Acute on chronic diastolic congestive heart failure, suspect progressively worsening over time secondary to underlying disease. Now improving. No history of dietary indiscretion. Reports medical compliance. Seems to respond well to Lasix. Dry weight unknown, but weight 104-107 kg last 6 months. 2. Chronic hypoxic respiratory failure maintained on 2-3 L per min nasal cannula continuously. Stable. 3. History of chronic kidney disease stage III. Stable. 4. History of coronary artery disease with history of multiple stent procedures. Quiescent. Troponins negative. 5. Diabetes mellitus type 2 uncontrolled by hemoglobin A1c 10.7. Blood sugars stable. 6. OSA on CPAP   Improving rapidly. Starting to develop contraction alkalosis. Plan to change to oral diuretics in the morning. Will discuss with her cardiologist outpatient diuretic regimen in the morning.  Likely discharge in 48 hours.  Code Status: full code DVT prophylaxis: Lovenox Family Communication:  Disposition Plan: home  Murray Hodgkins, MD  [facility]  Pager [phone] If 7PM-7AM, please contact night-coverage at [email or web address],  password [facility] 03/07/2014, 2:49 PM  LOS: 2 days   Consultants:    Procedures:  2-D echocardiogram: Left ventricular ejection fraction 65-70%. Normal wall motion. No regional wall motion abnormalities. Grade 1 diastolic dysfunction.  Antibiotics:    HPI/Subjective: Feeling better although still somewhat short of breath. Decreasing lower extremity edema. Edema in hands has resolved.  Objective: Filed Vitals:   03/07/14 0440 03/07/14 0500 03/07/14 0728 03/07/14 1036  BP: 178/78   206/71  Pulse: 64   65  Temp: 98.6 F (37 C)   98.2 F (36.8 C)  TempSrc: Oral   Oral  Resp: 20   20  Height:      Weight:  106.187 kg (234 lb 1.6 oz)    SpO2: 98%  97% 97%    Intake/Output Summary (Last 24 hours) at 03/07/14 1449 Last data filed at 03/07/14 1300  Gross per 24 hour  Intake    840 ml  Output   4150 ml  Net  -3310 ml     Filed Weights   03/05/14 1429 03/06/14 0521 03/07/14 0500  Weight: 106.55 kg (234 lb 14.4 oz) 106.3 kg (234 lb 5.6 oz) 106.187 kg (234 lb 1.6 oz)    Exam:   Afebrile, vital signs are stable. Stable hypoxia.  Gen. Appears comfortable, calm, speech fluent and clear.  Cardiovascular regular rate and rhythm. No murmur, rub or gallop. 2+ ankle edema, however lower leg edema has completely resolved. Telemetry sinus rhythm.    respiratory clear to auscultation bilaterally. No wheezes, rales or rhonchi. Normal respiratory effort.  Psychiatric    Normal mood and affect. Speech fluent and appropriate.  Data Reviewed:  Capillary blood sugars stable.  Troponins negative.  Creatinine stable 1.6, BUN normal.  Weights difficult to assess, no  significant change. However this is doubted, -5.2 L since admission. UOP 2.9 L last 24 hours. Excellent urine output.  Scheduled Meds: . aspirin EC  81 mg Oral q morning - 10a  . atorvastatin  10 mg Oral q1800  . budesonide-formoterol  2 puff Inhalation BID  . clopidogrel  75 mg Oral q morning - 10a  . enoxaparin  (LOVENOX) injection  40 mg Subcutaneous Q24H  . ezetimibe  10 mg Oral q morning - 10a  . famotidine  20 mg Oral BID  . ferrous sulfate  325 mg Oral Daily  . furosemide  40 mg Intravenous Q12H  . insulin aspart  0-20 Units Subcutaneous TID WC  . insulin aspart  0-5 Units Subcutaneous QHS  . insulin aspart  10 Units Subcutaneous TID WC  . insulin glargine  40 Units Subcutaneous BID  . isosorbide mononitrate  30 mg Oral Q2000  . isosorbide mononitrate  90 mg Oral Daily  . nebivolol  5 mg Oral BID  . ranolazine  1,000 mg Oral BID  . sodium chloride  3 mL Intravenous Q12H  . sodium chloride  3 mL Intravenous Q12H  . spironolactone  12.5 mg Oral Daily   Continuous Infusions:   Principal Problem:   CHF, acute on chronic Active Problems:   HYPERLIPIDEMIA   HYPERTENSION   CAD, prior LAD/RCA/CFX stents, cath 09/12/12 with patent stents, low risk BNuc 01/14/13   COPD, chronic O2   GERD   SLEEP APNEA, on C-pap   Acute on chronic diastolic heart failure - with SSx of Unstable Angina   CKD (chronic kidney disease) stage 3, GFR 30-59 ml/min   Leg edema   Time spent 20 minutes

## 2014-03-08 LAB — GLUCOSE, CAPILLARY
GLUCOSE-CAPILLARY: 181 mg/dL — AB (ref 70–99)
GLUCOSE-CAPILLARY: 254 mg/dL — AB (ref 70–99)
GLUCOSE-CAPILLARY: 162 mg/dL — AB (ref 70–99)
GLUCOSE-CAPILLARY: 134 mg/dL — AB (ref 70–99)

## 2014-03-08 LAB — BASIC METABOLIC PANEL
BUN: 26 mg/dL — ABNORMAL HIGH (ref 6–23)
CO2: 36 meq/L — AB (ref 19–32)
Calcium: 9.2 mg/dL (ref 8.4–10.5)
Chloride: 94 mEq/L — ABNORMAL LOW (ref 96–112)
Creatinine, Ser: 1.78 mg/dL — ABNORMAL HIGH (ref 0.50–1.10)
GFR calc Af Amer: 31 mL/min — ABNORMAL LOW (ref >90)
GFR, EST NON AFRICAN AMERICAN: 27 mL/min — AB (ref >90)
Glucose, Bld: 207 mg/dL — ABNORMAL HIGH (ref 70–99)
POTASSIUM: 3.7 meq/L (ref 3.7–5.3)
SODIUM: 140 meq/L (ref 137–147)

## 2014-03-08 MED ORDER — INSULIN GLARGINE 100 UNIT/ML ~~LOC~~ SOLN
40.0000 [IU] | Freq: Two times a day (BID) | SUBCUTANEOUS | Status: DC
  Administered 2014-03-08 – 2014-03-13 (×10): 40 [IU] via SUBCUTANEOUS
  Filled 2014-03-08 (×15): qty 0.4

## 2014-03-08 MED ORDER — INSULIN GLARGINE 100 UNIT/ML ~~LOC~~ SOLN
42.0000 [IU] | Freq: Two times a day (BID) | SUBCUTANEOUS | Status: DC
  Filled 2014-03-08 (×2): qty 0.42

## 2014-03-08 MED ORDER — TORSEMIDE 20 MG PO TABS
40.0000 mg | ORAL_TABLET | Freq: Two times a day (BID) | ORAL | Status: DC
  Administered 2014-03-08 – 2014-03-09 (×3): 40 mg via ORAL
  Filled 2014-03-08 (×3): qty 2

## 2014-03-08 NOTE — Progress Notes (Signed)
Patient seen, independently examined and chart reviewed. I agree with exam, assessment and plan discussed with Dyanne Carrel, NP.  Subjective: Feeling better she. Breathing better. Decreasing lower remedy edema.  Objective: Afebrile, vital signs are stable. Chronic stable hypoxia 2 L. Sitting in chair. Appears calm and comfortable. Speech fluent and clear. Cardiovascular regular rate and rhythm. No murmur, rub or gallop. 1+ bilateral ankle edema right greater than left. Improving. Respiratory clear to auscultation bilaterally. No wheezes, rales or rhonchi. Normal respiratory effort.  Weights without significant change, however -6.6 L since admission. 2,700 mL UOP last 24 hours. Creatinine slightly above baseline, 1.78. BUN slightly elevated to 26.  Clinically much improved, acute component of diastolic heart failure appears resolved. She is likely add her on weight based on clinical exam as well as elevation in BUN and creatinine. Transition back to oral antibiotics diuretics today, assess urine output overnight, stable, plan discharge home with outpatient followup with cardiology.  Murray Hodgkins, MD [facility] [phone]

## 2014-03-08 NOTE — Progress Notes (Signed)
Inpatient Diabetes Program Recommendations  AACE/ADA: New Consensus Statement on Inpatient Glycemic Control (2013)  Target Ranges:  Prepandial:   less than 140 mg/dL      Peak postprandial:   less than 180 mg/dL (1-2 hours)      Critically ill patients:  140 - 180 mg/dL   Results for MILAROSE, SAVICH (MRN [id number]) as of 03/08/2014 09:09  Ref. Range 03/07/2014 07:46 03/07/2014 11:39 03/07/2014 16:43 03/07/2014 20:02  Glucose-Capillary Latest Range: 70-99 mg/dL 185 (H) 234 (H) 92 195 (H)  Results for ABELINA, KETRON (MRN [id number]) as of 03/08/2014 09:09  Ref. Range 03/07/2014 09:44 03/08/2014 06:16  Glucose Latest Range: 70-99 mg/dL 324 (H) 207 (H)   Diabetes history: DM2 Outpatient Diabetes medications: Lantus 40 units BID, Novolog 45 units in AM, Novolog 40 units in PM, Invokana 100 mg QAM, Victoza 18 mg daily Current orders for Inpatient glycemic control: Lantus 40 units BID, Novolog 0-20 AC, Novolog 10 units TID with meals  Inpatient Diabetes Program Recommendations Insulin - Basal: Please consider increasing Lantus to 42 units BID.  Thanks, Barnie Alderman, RN, MSN, CCRN Diabetes Coordinator Inpatient Diabetes Program [phone] (Team Pager) [phone] (AP office) [phone] [facility] office)

## 2014-03-08 NOTE — Progress Notes (Signed)
[facility] PROGRESS NOTE  Krystal [name] KYH:[id number] DOB: Sep 13, 1936 DOA: 03/05/2014 PCP: [name], MD  Summary:  78 year old woman with history of coronary artery disease with multiple stent procedures, chronic diastolic heart failure with lower extremity edema presented with increasing shortness of breath and lower extremity edema. She has been waking up at night short of breath and has been spending the rest of the night and a chair. She has had increasing lower extremity edema over the last several weeks and becomes very dyspneic on exertion. Her activities quite limited secondary to shortness of breath. She reports compliance with low salt diet and medications. She does report 3-4 episodes of chest pain over the last month. None in the last several days. She does not weigh herself.    Assessment/Plan: 1. Acute on chronic diastolic congestive heart failure, suspect progressively worsening over time secondary to underlying disease. Continues to improve. No history of dietary indiscretion. Reports missing a few doses of medication the week before presentation. Volume status -6.3L. Discussed with cardiology.  Will change IV  Lasix to demadex at higher dose than what she was on previously. Has a follow up appointment 03/23/14 with Dr. Jacinta Shoe. Dry weight unknown, but weight 104-107 kg last 6 months. 2. Chronic hypoxic respiratory failure maintained on 2-3 L per min nasal cannula continuously. Remains stable. 3. History of chronic kidney disease stage III. Bump in creatinine this am. Will discontinue IV lasix. See #1. monitor 4. History of coronary artery disease with history of multiple stent procedures. Quiescent. Troponins negative. No chest pain.  5. Diabetes mellitus type 2 uncontrolled by hemoglobin A1c 10.7. Blood sugars stable. 6. OSA on CPAP   Code Status: full Family Communication: none present Disposition Plan: home hopefully in  am   Consultants:  none  Procedures: 2-D echocardiogram: Left ventricular ejection fraction 65-70%. Normal wall motion. No regional wall motion abnormalities. Grade 1 diastolic dysfunction   Antibiotics None  HPI/Subjective: Sitting in chair eating lunch. Denies pain/discomfort.   Objective: Filed Vitals:   03/08/14 0535  BP: 158/82  Pulse: 60  Temp: 97.7 F (36.5 C)  Resp: 16    Intake/Output Summary (Last 24 hours) at 03/08/14 1236 Last data filed at 03/08/14 1100  Gross per 24 hour  Intake    720 ml  Output   1800 ml  Net  -1080 ml   Filed Weights   03/06/14 0521 03/07/14 0500 03/08/14 0535  Weight: 106.3 kg (234 lb 5.6 oz) 106.187 kg (234 lb 1.6 oz) 106.641 kg (235 lb 1.6 oz)    Exam:   General:  Obese NAD  Cardiovascular: RRR No MGR trace LE edema.   Respiratory: normal effort BS clear bilaterally to auscultation no crackles no wheeze  Abdomen: obese soft +BS non-tender to palpation  Musculoskeletal: no clubbing or cyanosis   Data Reviewed: Basic Metabolic Panel:  Recent Labs Lab 03/05/14 1025 03/06/14 0550 03/07/14 0944 03/08/14 0616  NA 140 140 140 140  K 4.2 3.7 4.0 3.7  CL 99 98 94* 94*  CO2 28 33* 37* 36*  GLUCOSE 387* 252* 324* 207*  BUN 16 19 21  26*  CREATININE 1.49* 1.56* 1.60* 1.78*  CALCIUM 9.6 9.0 9.4 9.2  MG  --   --  1.6  --    Liver Function Tests: No results found for this basename: AST, ALT, ALKPHOS, BILITOT, PROT, ALBUMIN,  in the last 168 hours No results found for this basename: LIPASE, AMYLASE,  in the last 168 hours No results  found for this basename: AMMONIA,  in the last 168 hours CBC:  Recent Labs Lab 03/05/14 1025 03/06/14 0550  WBC 7.2 6.2  NEUTROABS 4.2  --   HGB 11.4* 10.1*  HCT 36.6 32.5*  MCV 78.7 78.5  PLT 240 214   Cardiac Enzymes:  Recent Labs Lab 03/05/14 1025 03/05/14 1747 03/05/14 2330  TROPONINI <0.30 <0.30 <0.30   BNP (last 3 results)  Recent Labs  03/05/14 1025  PROBNP  1239.0*   CBG:  Recent Labs Lab 03/07/14 1139 03/07/14 1643 03/07/14 2002 03/08/14 0804 03/08/14 1111  GLUCAP 234* 92 195* 181* 254*    Recent Results (from the past 240 hour(s))  URINE CULTURE     Status: None   Collection Time    03/05/14 11:06 AM      Result Value Ref Range Status   Specimen Description URINE, CLEAN CATCH   Final   Special Requests NONE   Final   Culture  Setup Time     Final   Value: 03/06/2014 02:40     Performed at [facility]     Final   Value: 5,000 COLONIES/ML     Performed at [facility]   Culture     Final   Value: INSIGNIFICANT GROWTH     Performed at [facility]   Report Status 03/07/2014 FINAL   Final     Studies: No results found.  Scheduled Meds: . aspirin EC  81 mg Oral q morning - 10a  . atorvastatin  10 mg Oral q1800  . budesonide-formoterol  2 puff Inhalation BID  . clopidogrel  75 mg Oral q morning - 10a  . enoxaparin (LOVENOX) injection  40 mg Subcutaneous Q24H  . ezetimibe  10 mg Oral q morning - 10a  . famotidine  20 mg Oral BID  . ferrous sulfate  325 mg Oral Daily  . insulin aspart  0-20 Units Subcutaneous TID WC  . insulin aspart  0-5 Units Subcutaneous QHS  . insulin aspart  10 Units Subcutaneous TID WC  . insulin glargine  42 Units Subcutaneous BID  . isosorbide mononitrate  30 mg Oral Q2000  . isosorbide mononitrate  90 mg Oral Daily  . loratadine  10 mg Oral Daily  . nebivolol  5 mg Oral BID  . ranolazine  1,000 mg Oral BID  . sodium chloride  3 mL Intravenous Q12H  . sodium chloride  3 mL Intravenous Q12H  . spironolactone  12.5 mg Oral Daily  . torsemide  40 mg Oral BID   Continuous Infusions:   Principal Problem:   CHF, acute on chronic Active Problems:   HYPERLIPIDEMIA   HYPERTENSION   CAD, prior LAD/RCA/CFX stents, cath 09/12/12 with patent stents, low risk BNuc 01/14/13   COPD, chronic O2   GERD   SLEEP APNEA, on C-pap   Acute on chronic diastolic heart  failure - with SSx of Unstable Angina   CKD (chronic kidney disease) stage 3, GFR 30-59 ml/min   Leg edema    Time spent: 35 minutes    [facility] Pager [phone]. If 7PM-7AM, please contact night-coverage at [email or web address], password [facility] 03/08/2014, 12:36 PM  LOS: 3 days

## 2014-03-08 NOTE — Progress Notes (Signed)
Physical Therapy Evaluation Patient Details Name: Krystal [name] MRN: [id number] DOB: Jan 20, 1936 Today's Date: 03/08/2014   History of Present Illness  Pt is admitted with acute on chronic CHF.  She is much improved today, chronically on supplemental O2 at 2 L/min.  She lives alone and states that she is needing assist with housework, ADLs etc due to generalized weakness, malaise.  Clinical Impression   Pt was seen for evaluation.  She was found to be very pleasant and cooperative, generally deconditioned.  She reports that she has been receiving PT at home.  She reports her breathing to be much less labored.  She is functional enough to be able to transition to home at d/c with resumption of HHPT.  She would like home health aide service as well.  I suggested to her that she might want to look into ACLF and she reports that she has already started to investigate this.  I will ask MSW to give info as to ACLF in the area.    Follow Up Recommendations Home health PT    Equipment Recommendations  None recommended by PT    Recommendations for Other Services   none    Precautions / Restrictions Precautions Precautions: None Restrictions Weight Bearing Restrictions: No      Mobility  Bed Mobility Overal bed mobility: Modified Independent                Transfers Overall transfer level: Modified independent Equipment used: Rolling walker (2 wheeled)                Ambulation/Gait Ambulation/Gait assistance: Supervision Ambulation Distance (Feet): 175 Feet Assistive device: Rolling walker (2 wheeled) Gait Pattern/deviations: WFL(Within Functional Limits);Trunk flexed   Gait velocity interpretation: Below normal speed for age/gender General Gait Details: needed several rest stops  during gait  Stairs:  N/T                   Balance Overall balance assessment: Modified Independent                                             Home  Living Family/patient expects to be discharged to:: Private residence Living Arrangements: Alone Available Help at Discharge: Friend(s);Available PRN/intermittently Type of Home: House Home Access: Stairs to enter Entrance Stairs-Rails: None Entrance Stairs-Number of Steps: 1 Home Layout: One level Home Equipment: Environmental consultant - 2 wheels      Prior Function Level of Independence: Independent         Comments: does need assist with getting groceries, is able to drive herself to the MD             Extremity/Trunk Assessment   Upper Extremity Assessment: Generalized weakness           Lower Extremity Assessment: Generalized weakness      Cervical / Trunk Assessment: Kyphotic  Communication   Communication: No difficulties  Cognition Arousal/Alertness: Awake/alert Behavior During Therapy: WFL for tasks assessed/performed Overall Cognitive Status: Within Functional Limits for tasks assessed                            Exercises General Exercises - Lower Extremity Long Arc Quad: AROM;Both;10 reps;Seated Hip Flexion/Marching: AROM;Seated;Both;10 reps      Assessment/Plan    PT Assessment Patient needs continued PT services  PT Diagnosis  Difficulty walking;Generalized weakness   PT Problem List Decreased strength;Decreased activity tolerance;Decreased mobility;Obesity  PT Treatment Interventions Gait training;Stair training;Therapeutic exercise   PT Goals (Current goals can be found in the Care Plan section) Acute Rehab PT Goals Patient Stated Goal: none stated PT Goal Formulation: With patient Time For Goal Achievement: 03/22/14 Potential to Achieve Goals: Good    Frequency Min 3X/week   Barriers to discharge Decreased caregiver support                     End of Session Equipment Utilized During Treatment: Gait belt;Oxygen Activity Tolerance: Patient tolerated treatment well Patient left: in bed;with call bell/phone within reach;with  family/visitor present           Time: 2993-7169 PT Time Calculation (min): 36 min   Charges:   PT Evaluation $Initial PT Evaluation Tier I: 1 Procedure     PT G Codes:          Sable Feil 03/08/2014, 1:55 PM

## 2014-03-09 ENCOUNTER — Inpatient Hospital Stay (HOSPITAL_COMMUNITY): Payer: Medicare Other

## 2014-03-09 DIAGNOSIS — E1129 Type 2 diabetes mellitus with other diabetic kidney complication: Secondary | ICD-10-CM

## 2014-03-09 DIAGNOSIS — I5033 Acute on chronic diastolic (congestive) heart failure: Principal | ICD-10-CM

## 2014-03-09 DIAGNOSIS — R0989 Other specified symptoms and signs involving the circulatory and respiratory systems: Secondary | ICD-10-CM

## 2014-03-09 DIAGNOSIS — N183 Chronic kidney disease, stage 3 unspecified: Secondary | ICD-10-CM

## 2014-03-09 DIAGNOSIS — E1165 Type 2 diabetes mellitus with hyperglycemia: Secondary | ICD-10-CM

## 2014-03-09 DIAGNOSIS — R0609 Other forms of dyspnea: Secondary | ICD-10-CM

## 2014-03-09 DIAGNOSIS — J449 Chronic obstructive pulmonary disease, unspecified: Secondary | ICD-10-CM

## 2014-03-09 LAB — BASIC METABOLIC PANEL
BUN: 32 mg/dL — ABNORMAL HIGH (ref 6–23)
CO2: 36 meq/L — AB (ref 19–32)
Calcium: 9 mg/dL (ref 8.4–10.5)
Chloride: 95 mEq/L — ABNORMAL LOW (ref 96–112)
Creatinine, Ser: 2.05 mg/dL — ABNORMAL HIGH (ref 0.50–1.10)
GFR calc non Af Amer: 23 mL/min — ABNORMAL LOW (ref >90)
GFR, EST AFRICAN AMERICAN: 26 mL/min — AB (ref >90)
Glucose, Bld: 179 mg/dL — ABNORMAL HIGH (ref 70–99)
POTASSIUM: 3.8 meq/L (ref 3.7–5.3)
Sodium: 142 mEq/L (ref 137–147)

## 2014-03-09 LAB — GLUCOSE, CAPILLARY
GLUCOSE-CAPILLARY: 158 mg/dL — AB (ref 70–99)
GLUCOSE-CAPILLARY: 267 mg/dL — AB (ref 70–99)
GLUCOSE-CAPILLARY: 141 mg/dL — AB (ref 70–99)
Glucose-Capillary: 160 mg/dL — ABNORMAL HIGH (ref 70–99)

## 2014-03-09 LAB — ALBUMIN: Albumin: 3.2 g/dL — ABNORMAL LOW (ref 3.5–5.2)

## 2014-03-09 MED ORDER — SODIUM CHLORIDE 0.9 % IV SOLN
INTRAVENOUS | Status: AC
  Administered 2014-03-09: 20:00:00 via INTRAVENOUS

## 2014-03-09 MED ORDER — ENOXAPARIN SODIUM 30 MG/0.3ML ~~LOC~~ SOLN
30.0000 mg | SUBCUTANEOUS | Status: DC
  Administered 2014-03-09 – 2014-03-12 (×4): 30 mg via SUBCUTANEOUS
  Filled 2014-03-09 (×4): qty 0.3

## 2014-03-09 NOTE — Plan of Care (Signed)
Problem: Phase I Progression Outcomes Goal: Up in chair, BRP Outcome: Progressing Patient ambulating in room,out of bed to chair this afternoon.

## 2014-03-09 NOTE — Progress Notes (Signed)
[facility] PROGRESS NOTE  Krystal [name] QQV:[id number] DOB: [birth year] DOA: 03/05/2014 PCP: [name], MD  Summary 78 year old woman with history of coronary artery disease with multiple stent procedures, chronic diastolic heart failure with lower extremity edema presented with increasing shortness of breath and lower extremity edema. She has been waking up at night short of breath and has been spending the rest of the night and a chair. She has had increasing lower extremity edema over the last several weeks and becomes very dyspneic on exertion. Her activities quite limited secondary to shortness of breath. She reports compliance with low salt diet and medications. She does report 3-4 episodes of chest pain over the last month. None in the last several days. She does not weigh herself.    Assessment/Plan: 1. Acute on chronic diastolic congestive heart failure, suspect progressively worsening over time secondary to underlying disease. More DOE today.  Volume status -5.7L. May have overdiuresed. Will hold demadex and obtain chest xray. If clear will consider gently fluid. Oxygen saturation level >96% on 2L.  Has a follow up appointment 03/23/14 with Dr. Jacinta Shoe. Dry weight unknown, but weight 104-107 kg last 6 months. 2. Chronic hypoxic respiratory failure maintained on 2-3 L per min nasal cannula continuously. Remains stable. 3. History of chronic kidney disease stage III. Bump in creatinine again  this am. Will hold demadex today. Consider gently IV if chest xray clear.  See #1. monitor 4. History of coronary artery disease with history of multiple stent procedures. Quiescent. Troponins negative. No chest pain.  5. Diabetes mellitus type 2 uncontrolled by hemoglobin A1c 10.7. Blood sugars stable. 6. OSA on CPAP   Code Status: full Family Communication: none present Disposition Plan: home when ready   Consultants:  none  Procedures: 2-D echocardiogram: Left ventricular  ejection fraction 65-70%. Normal wall motion. No regional wall motion abnormalities. Grade 1 diastolic dysfunction   Antibiotics:  none  HPI/Subjective: Sitting up in bed. Reports feeling "poorly" . Dizziness and sob with ambulating to BR. Denies pain  Objective: Filed Vitals:   03/09/14 0656  BP: 148/73  Pulse: 58  Temp: 98.3 F (36.8 C)  Resp: 19    Intake/Output Summary (Last 24 hours) at 03/09/14 1417 Last data filed at 03/09/14 0900  Gross per 24 hour  Intake   1200 ml  Output    600 ml  Net    600 ml   Filed Weights   03/07/14 0500 03/08/14 0535 03/09/14 0656  Weight: 106.187 kg (234 lb 1.6 oz) 106.641 kg (235 lb 1.6 oz) 107.321 kg (236 lb 9.6 oz)    Exam:   General:  Obese NAD  Cardiovascular: RRR No MGR trace -1+ LE edema  Respiratory: normal effort at rest. BS coarse with crackling at bases. No wheeze  Abdomen: obese soft +BS non-tender to palpation  Musculoskeletal: no clubbing no cyanosis   Data Reviewed: Basic Metabolic Panel:  Recent Labs Lab 03/05/14 1025 03/06/14 0550 03/07/14 0944 03/08/14 0616 03/09/14 0537  NA 140 140 140 140 142  K 4.2 3.7 4.0 3.7 3.8  CL 99 98 94* 94* 95*  CO2 28 33* 37* 36* 36*  GLUCOSE 387* 252* 324* 207* 179*  BUN 16 19 21  26* 32*  CREATININE 1.49* 1.56* 1.60* 1.78* 2.05*  CALCIUM 9.6 9.0 9.4 9.2 9.0  MG  --   --  1.6  --   --    Liver Function Tests: No results found for this basename: AST, ALT, ALKPHOS, BILITOT, PROT, ALBUMIN,  in the last 168 hours No results found for this basename: LIPASE, AMYLASE,  in the last 168 hours No results found for this basename: AMMONIA,  in the last 168 hours CBC:  Recent Labs Lab 03/05/14 1025 03/06/14 0550  WBC 7.2 6.2  NEUTROABS 4.2  --   HGB 11.4* 10.1*  HCT 36.6 32.5*  MCV 78.7 78.5  PLT 240 214   Cardiac Enzymes:  Recent Labs Lab 03/05/14 1025 03/05/14 1747 03/05/14 2330  TROPONINI <0.30 <0.30 <0.30   BNP (last 3 results)  Recent Labs   03/05/14 1025  PROBNP 1239.0*   CBG:  Recent Labs Lab 03/08/14 1111 03/08/14 1711 03/08/14 2112 03/09/14 0736 03/09/14 1107  GLUCAP 254* 162* 134* 158* 267*    Recent Results (from the past 240 hour(s))  URINE CULTURE     Status: None   Collection Time    03/05/14 11:06 AM      Result Value Ref Range Status   Specimen Description URINE, CLEAN CATCH   Final   Special Requests NONE   Final   Culture  Setup Time     Final   Value: 03/06/2014 02:40     Performed at [facility]     Final   Value: 5,000 COLONIES/ML     Performed at [facility]   Culture     Final   Value: INSIGNIFICANT GROWTH     Performed at [facility]   Report Status 03/07/2014 FINAL   Final     Studies: No results found.  Scheduled Meds: . aspirin EC  81 mg Oral q morning - 10a  . atorvastatin  10 mg Oral q1800  . budesonide-formoterol  2 puff Inhalation BID  . clopidogrel  75 mg Oral q morning - 10a  . enoxaparin (LOVENOX) injection  30 mg Subcutaneous Q24H  . ezetimibe  10 mg Oral q morning - 10a  . famotidine  20 mg Oral BID  . ferrous sulfate  325 mg Oral Daily  . insulin aspart  0-20 Units Subcutaneous TID WC  . insulin aspart  0-5 Units Subcutaneous QHS  . insulin aspart  10 Units Subcutaneous TID WC  . insulin glargine  40 Units Subcutaneous BID  . isosorbide mononitrate  30 mg Oral Q2000  . isosorbide mononitrate  90 mg Oral Daily  . loratadine  10 mg Oral Daily  . nebivolol  5 mg Oral BID  . ranolazine  1,000 mg Oral BID  . sodium chloride  3 mL Intravenous Q12H  . sodium chloride  3 mL Intravenous Q12H  . spironolactone  12.5 mg Oral Daily   Continuous Infusions:   Principal Problem:   CHF, acute on chronic Active Problems:   HYPERLIPIDEMIA   HYPERTENSION   CAD, prior LAD/RCA/CFX stents, cath 09/12/12 with patent stents, low risk BNuc 01/14/13   COPD, chronic O2   GERD   SLEEP APNEA, on C-pap   Acute on chronic diastolic heart  failure - with SSx of Unstable Angina   CKD (chronic kidney disease) stage 3, GFR 30-59 ml/min   Leg edema    Time spent: 35 minutes    [facility] Pager [phone]. If 7PM-7AM, please contact night-coverage at [email or web address], password [facility] 03/09/2014, 2:17 PM  LOS: 4 days

## 2014-03-09 NOTE — Progress Notes (Signed)
PT Cancellation Note  Patient Details Name: Krystal [name] MRN: [id number] DOB: 1936-06-15   Cancelled Treatment:    Reason Eval/Treat Not Completed: Other (comment) Pt reports constant dizziness and feels very poorly, nauseated because of this.  She states that the dizziness occurs in all positions.  She did not have any observable nystagmus.  She is concerned about being able to get someone to pick her up if she is discharged today.  Sable Feil 03/09/2014, 11:59 AM

## 2014-03-09 NOTE — Progress Notes (Signed)
Patient seen and examined. Above note reviewed.  She is admitted with shortness of breath related to acute on chronic diastolic congestive heart failure. Patient was aggressively diuresed from a fluid standpoint has improved. She continues to feel somewhat short of breath, but also complains of significant dizziness and nausea. She feels generally weak. It is possible that she has over diuresed. Chest x-ray does not indicate any signs of interstitial edema. Provide gentle hydration for the next 12 hours. Followup in a.m. She has developed some acute on chronic kidney disease stage III, likely related to diuresis. Diuretics are on hold for today. Continue to follow after gentle hydration tomorrow. Possible discharge home tomorrow if improved.  [facility]

## 2014-03-10 DIAGNOSIS — N179 Acute kidney failure, unspecified: Secondary | ICD-10-CM

## 2014-03-10 DIAGNOSIS — R42 Dizziness and giddiness: Secondary | ICD-10-CM

## 2014-03-10 LAB — BASIC METABOLIC PANEL
BUN: 39 mg/dL — ABNORMAL HIGH (ref 6–23)
CO2: 34 meq/L — AB (ref 19–32)
Calcium: 8.6 mg/dL (ref 8.4–10.5)
Chloride: 97 mEq/L (ref 96–112)
Creatinine, Ser: 2.39 mg/dL — ABNORMAL HIGH (ref 0.50–1.10)
GFR, EST AFRICAN AMERICAN: 22 mL/min — AB (ref >90)
GFR, EST NON AFRICAN AMERICAN: 19 mL/min — AB (ref >90)
Glucose, Bld: 189 mg/dL — ABNORMAL HIGH (ref 70–99)
Potassium: 3.6 mEq/L — ABNORMAL LOW (ref 3.7–5.3)
SODIUM: 142 meq/L (ref 137–147)

## 2014-03-10 LAB — GLUCOSE, CAPILLARY
GLUCOSE-CAPILLARY: 150 mg/dL — AB (ref 70–99)
Glucose-Capillary: 152 mg/dL — ABNORMAL HIGH (ref 70–99)
Glucose-Capillary: 175 mg/dL — ABNORMAL HIGH (ref 70–99)
Glucose-Capillary: 257 mg/dL — ABNORMAL HIGH (ref 70–99)

## 2014-03-10 MED ORDER — SODIUM CHLORIDE 0.9 % IV SOLN
INTRAVENOUS | Status: AC
  Administered 2014-03-10: 18:00:00 via INTRAVENOUS

## 2014-03-10 MED ORDER — ALPRAZOLAM 0.25 MG PO TABS
0.2500 mg | ORAL_TABLET | ORAL | Status: AC
  Administered 2014-03-10: 0.25 mg via ORAL
  Filled 2014-03-10: qty 1

## 2014-03-10 MED ORDER — MECLIZINE HCL 12.5 MG PO TABS
25.0000 mg | ORAL_TABLET | Freq: Three times a day (TID) | ORAL | Status: DC
  Administered 2014-03-10 – 2014-03-13 (×8): 25 mg via ORAL
  Filled 2014-03-10 (×8): qty 2

## 2014-03-10 MED ORDER — ALPRAZOLAM 0.25 MG PO TABS
0.2500 mg | ORAL_TABLET | Freq: Two times a day (BID) | ORAL | Status: DC | PRN
  Administered 2014-03-10 – 2014-03-11 (×2): 0.25 mg via ORAL
  Filled 2014-03-10 (×2): qty 1

## 2014-03-10 NOTE — Progress Notes (Addendum)
Physical Therapy Treatment Patient Details Name: Krystal [name] MRN: [id number] DOB: [birth year] Today's Date: 03/10/2014    PT Comments    Pt tolerated therex and gait well after initial multimodal cueing and demo. Pt was very pleasant and willing to complete bed exercises. Pt became dizzy when sitting EOB. Dizziness subsided with rest. Pt required several standing rest breaks during gait training. Dizziness subsided after cueing for diaphragmatic breathing. Pt was modified independent with transfers and bed mobility. Pt displays safe techniques with transfers. This entire session was guided, instructed, and directly supervised by Rachelle Hora, PTA.                Mobility  Bed Mobility Overal bed mobility: Modified Independent                Transfers Overall transfer level: Modified independent Equipment used: Rolling walker (2 wheeled)                Ambulation/Gait Ambulation/Gait assistance: Modified independent (Device/Increase time) Ambulation Distance (Feet): 150 Feet Assistive device: Rolling walker (2 wheeled) Gait Pattern/deviations: Decreased stride length;Step-through pattern   Gait velocity interpretation: Below normal speed for age/gender                                 Exercises General Exercises - Lower Extremity Ankle Circles/Pumps: AROM;Both;10 reps;Supine Quad Sets: AROM;Both;10 reps;Supine Heel Slides: AROM;Both;10 reps;Supine Hip ABduction/ADduction: AROM;Both;10 reps;Supine Straight Leg Raises: AROM;Both;10 reps;Supine        Pertinent Vitals/Pain Before session, pt rated low back pain as 8/10. Pt states that she would like to wait and receive pain medication after treatment. After session, pt rated low back pain as 9/10. RN notified.           PT Goals (current goals can now be found in the care plan section) Progress towards PT goals: Progressing toward goals       PT Plan Current plan remains appropriate        End of Session Equipment Utilized During Treatment: Gait belt (3 L of O2) Activity Tolerance: Patient tolerated treatment well Patient left: in bed;with call bell/phone within reach     Time: 0930-1005 PT Time Calculation (min): 35 min  Charges:  $Gait Training: 8-22 mins $Therapeutic Exercise: 8-22 mins                      Ahmed Prima, [name], PTA 03/10/2014, 10:24 AM

## 2014-03-10 NOTE — Plan of Care (Addendum)
Problem: Phase II Progression Outcomes Goal: Walk in hall or up in chair TID Outcome: Progressing Ambulated in hallway with PT, patient did well,she did use a walker to assist.

## 2014-03-10 NOTE — Progress Notes (Signed)
Patient seen and examined. Above note reviewed.  She continues to complain of some dizziness on standing. Creatinine has trended up today, although she did receive a dose of Demadex yesterday. She did receive gentle IV hydration yesterday. It appears that she is over diuresed. We'll continue with IV fluids for today. Recheck labs in the morning. Once her renal function is improving, she can likely discharge home. An extensive amount of time was spent counseling the patient on the importance of salt and fluid restriction.  [facility]

## 2014-03-10 NOTE — Progress Notes (Signed)
[facility] PROGRESS NOTE  Krystal [name] HAL:[id number] DOB: 07-26-1936 DOA: 03/05/2014 PCP: [name], MD  Summary  78 year old woman with history of coronary artery disease with multiple stent procedures, chronic diastolic heart failure with lower extremity edema presented with increasing shortness of breath and lower extremity edema. She has been waking up at night short of breath and has been spending the rest of the night and a chair. She has had increasing lower extremity edema over the last several weeks and becomes very dyspneic on exertion. Her activities quite limited secondary to shortness of breath. She reports compliance with low salt diet and medications. She does report 3-4 episodes of chest pain over the last month. None in the last several days. She does not weigh herself.    Assessment/Plan: 1. Acute on chronic diastolic congestive heart failure, suspect progressively worsening over time secondary to underlying disease.  Volume status -5.6L. May have over diuresed as she continues with dizziness with ambulation with DOE. Given gentle fluids yesterday and creatinine trended up. Urine output good. She did get demadex yesterday.  Will hold demadex today. Chest xray without edema. Oxygen saturation level 100% on 2.5L. Has a follow up appointment 03/23/14 with Dr. Jacinta Shoe. Dry weight unknown, but weight 104-107 kg last 6 months. 2. Chronic hypoxic respiratory failure maintained on 2-3 L per min nasal cannula continuously. Remains stable. 3. History of chronic kidney disease stage III. Bump in creatinine again this am. Will hold demadex today. Gentle IV fluids.  See #1. monitor 4. History of coronary artery disease with history of multiple stent procedures. Quiescent. Troponins negative. No chest pain.  5. Diabetes mellitus type 2 uncontrolled by hemoglobin A1c 10.7. Blood sugars stable. 6. OSA on CPAP   Code Status: full Family Communication: none present Disposition  Plan: home with [facility] hopefully tomorrow   Consultants:  none  Procedures: 2-D echocardiogram: Left ventricular ejection fraction 65-70%. Normal wall motion. No regional wall motion abnormalities. Grade 1 diastolic dysfunction    Antibiotics:  none  HPI/Subjective: Sitting up in bed eating lunch. Reports dizziness with walking and mild UE tremors  Objective: Filed Vitals:   03/10/14 1245  BP: 115/82  Pulse: 62  Temp: 97.4 F (36.3 C)  Resp: 20    Intake/Output Summary (Last 24 hours) at 03/10/14 1252 Last data filed at 03/10/14 0920  Gross per 24 hour  Intake 1398.75 ml  Output   1000 ml  Net 398.75 ml   Filed Weights   03/08/14 0535 03/09/14 0656 03/10/14 0552  Weight: 106.641 kg (235 lb 1.6 oz) 107.321 kg (236 lb 9.6 oz) 103.329 kg (227 lb 12.8 oz)    Exam:   General:  Obese NAD  Cardiovascular: RRR No m/g/r trace LE edema  Respiratory: normal effort with BS somewhat coarse  Abdomen: obese soft +BS non-tender to palpation  Musculoskeletal: no clubbing no cyanosis   Data Reviewed: Basic Metabolic Panel:  Recent Labs Lab 03/06/14 0550 03/07/14 0944 03/08/14 0616 03/09/14 0537 03/10/14 0541  NA 140 140 140 142 142  K 3.7 4.0 3.7 3.8 3.6*  CL 98 94* 94* 95* 97  CO2 33* 37* 36* 36* 34*  GLUCOSE 252* 324* 207* 179* 189*  BUN 19 21 26* 32* 39*  CREATININE 1.56* 1.60* 1.78* 2.05* 2.39*  CALCIUM 9.0 9.4 9.2 9.0 8.6  MG  --  1.6  --   --   --    Liver Function Tests:  Recent Labs Lab 03/09/14 1403  ALBUMIN 3.2*  No results found for this basename: LIPASE, AMYLASE,  in the last 168 hours No results found for this basename: AMMONIA,  in the last 168 hours CBC:  Recent Labs Lab 03/05/14 1025 03/06/14 0550  WBC 7.2 6.2  NEUTROABS 4.2  --   HGB 11.4* 10.1*  HCT 36.6 32.5*  MCV 78.7 78.5  PLT 240 214   Cardiac Enzymes:  Recent Labs Lab 03/05/14 1025 03/05/14 1747 03/05/14 2330  TROPONINI <0.30 <0.30 <0.30   BNP (last 3  results)  Recent Labs  03/05/14 1025  PROBNP 1239.0*   CBG:  Recent Labs Lab 03/09/14 1107 03/09/14 1639 03/09/14 2135 03/10/14 0722 03/10/14 1117  GLUCAP 267* 160* 141* 150* 152*    Recent Results (from the past 240 hour(s))  URINE CULTURE     Status: None   Collection Time    03/05/14 11:06 AM      Result Value Ref Range Status   Specimen Description URINE, CLEAN CATCH   Final   Special Requests NONE   Final   Culture  Setup Time     Final   Value: 03/06/2014 02:40     Performed at [facility]     Final   Value: 5,000 COLONIES/ML     Performed at [facility]   Culture     Final   Value: INSIGNIFICANT GROWTH     Performed at [facility]   Report Status 03/07/2014 FINAL   Final     Studies: Dg Chest Port 1 View  03/09/2014   CLINICAL DATA:  Shortness of breath, diabetes, coronary artery disease post MI, hypertension  EXAM: PORTABLE CHEST - 1 VIEW  COMPARISON:  Portable exam 1503 hr compared to 03/05/2014  FINDINGS: Borderline enlargement of cardiac silhouette.  Pulmonary vascular congestion.  Mediastinal contours normal.  Lungs grossly clear.  No pleural effusion or pneumothorax.  Bones unremarkable.  IMPRESSION: No acute abnormalities.  Borderline enlargement of cardiac silhouette.   Electronically Signed   By: Lavonia Dana M.D.   On: 03/09/2014 16:29    Scheduled Meds: . aspirin EC  81 mg Oral q morning - 10a  . atorvastatin  10 mg Oral q1800  . budesonide-formoterol  2 puff Inhalation BID  . clopidogrel  75 mg Oral q morning - 10a  . enoxaparin (LOVENOX) injection  30 mg Subcutaneous Q24H  . ezetimibe  10 mg Oral q morning - 10a  . famotidine  20 mg Oral BID  . ferrous sulfate  325 mg Oral Daily  . insulin aspart  0-20 Units Subcutaneous TID WC  . insulin aspart  0-5 Units Subcutaneous QHS  . insulin aspart  10 Units Subcutaneous TID WC  . insulin glargine  40 Units Subcutaneous BID  . isosorbide mononitrate  30 mg  Oral Q2000  . isosorbide mononitrate  90 mg Oral Daily  . loratadine  10 mg Oral Daily  . nebivolol  5 mg Oral BID  . ranolazine  1,000 mg Oral BID  . sodium chloride  3 mL Intravenous Q12H  . sodium chloride  3 mL Intravenous Q12H  . spironolactone  12.5 mg Oral Daily   Continuous Infusions: . sodium chloride      Principal Problem:   CHF, acute on chronic Active Problems:   HYPERLIPIDEMIA   HYPERTENSION   CAD, prior LAD/RCA/CFX stents, cath 09/12/12 with patent stents, low risk BNuc 01/14/13   COPD, chronic O2   GERD   SLEEP APNEA, on  C-pap   Acute on chronic diastolic heart failure - with SSx of Unstable Angina   CKD (chronic kidney disease) stage 3, GFR 30-59 ml/min   Leg edema    Time spent: 35 minutes    [facility] Pager [phone]. If 7PM-7AM, please contact night-coverage at [email or web address], password [facility] 03/10/2014, 12:52 PM  LOS: 5 days

## 2014-03-11 ENCOUNTER — Inpatient Hospital Stay (HOSPITAL_COMMUNITY): Payer: Medicare Other

## 2014-03-11 DIAGNOSIS — N179 Acute kidney failure, unspecified: Secondary | ICD-10-CM | POA: Diagnosis not present

## 2014-03-11 DIAGNOSIS — N189 Chronic kidney disease, unspecified: Secondary | ICD-10-CM

## 2014-03-11 LAB — GLUCOSE, CAPILLARY
GLUCOSE-CAPILLARY: 187 mg/dL — AB (ref 70–99)
Glucose-Capillary: 165 mg/dL — ABNORMAL HIGH (ref 70–99)
Glucose-Capillary: 213 mg/dL — ABNORMAL HIGH (ref 70–99)

## 2014-03-11 LAB — BASIC METABOLIC PANEL
BUN: 45 mg/dL — AB (ref 6–23)
CO2: 31 meq/L (ref 19–32)
Calcium: 8.9 mg/dL (ref 8.4–10.5)
Chloride: 101 mEq/L (ref 96–112)
Creatinine, Ser: 2.39 mg/dL — ABNORMAL HIGH (ref 0.50–1.10)
GFR calc Af Amer: 22 mL/min — ABNORMAL LOW (ref >90)
GFR, EST NON AFRICAN AMERICAN: 19 mL/min — AB (ref >90)
Glucose, Bld: 221 mg/dL — ABNORMAL HIGH (ref 70–99)
POTASSIUM: 4.5 meq/L (ref 3.7–5.3)
SODIUM: 142 meq/L (ref 137–147)

## 2014-03-11 MED ORDER — TORSEMIDE 20 MG PO TABS
40.0000 mg | ORAL_TABLET | Freq: Every day | ORAL | Status: DC
  Administered 2014-03-11 – 2014-03-13 (×3): 40 mg via ORAL
  Filled 2014-03-11 (×3): qty 2

## 2014-03-11 MED ORDER — FAMOTIDINE 20 MG PO TABS
20.0000 mg | ORAL_TABLET | Freq: Every day | ORAL | Status: DC
  Administered 2014-03-12 – 2014-03-13 (×2): 20 mg via ORAL
  Filled 2014-03-11 (×2): qty 1

## 2014-03-11 NOTE — Progress Notes (Signed)
Nutrition Brief Note  RD pulled to chart due to LOS  Wt Readings from Last 15 Encounters:  03/10/14 227 lb 12.8 oz (103.329 kg)  03/03/14 242 lb (109.77 kg)  02/12/14 238 lb (107.956 kg)  02/08/14 234 lb 3.2 oz (106.232 kg)  01/18/14 236 lb 3.2 oz (107.14 kg)  01/11/14 233 lb 3.2 oz (105.779 kg)  12/28/13 233 lb 12.8 oz (106.051 kg)  09/10/13 229 lb 4.8 oz (104.01 kg)  08/14/13 227 lb (102.967 kg)  08/13/13 231 lb 1.6 oz (104.826 kg)  08/13/13 227 lb 14.4 oz (103.375 kg)  07/28/13 228 lb 8 oz (103.647 kg)  07/01/13 224 lb 4.8 oz (101.742 kg)  06/10/13 224 lb 1.6 oz (101.651 kg)  03/27/13 212 lb (96.163 kg)    Body mass index is 35.67 kg/(m^2). Patient meets criteria for obesity, class II based on current BMI.   Current diet order is Heart healthy/ carb modified, patient is consuming approximately 75-100% of meals at this time. Labs and medications reviewed.   No nutrition interventions warranted at this time. If nutrition issues arise, please consult RD.   Quamaine Webb A. Jimmye Norman, RD, LDN Pager: [phone]

## 2014-03-11 NOTE — Progress Notes (Signed)
[facility] PROGRESS NOTE  Krystal [name] ERX:[id number] DOB: 1936-09-24 DOA: 03/05/2014 PCP: [name], MD  Summary  78 year old woman with history of coronary artery disease with multiple stent procedures, chronic diastolic heart failure with lower extremity edema presented with increasing shortness of breath and lower extremity edema and chest pain. Admitted for acute on chronic CHF related to med non-compliance and diet indiscretions. Given IV lasix.  May have been over diuresed and developed persistent ARF. Nephrology consulted.   Assessment/Plan: 1. Acute on chronic diastolic congestive heart failure, suspect progressively worsening over time secondary to underlying disease. Volume status -5.1L. Weight 103.3 kg. May have over diuresed as she developed dizziness with ambulation with DOE.  Given gentle fluids for 2 days and dizziness improved but  and creatinine did not trend down. Urine output good. Demadex on hold. Oxygen saturation level 95% on 2.L which is what she uses at home. Has a follow up appointment 03/23/14 with Dr. Jacinta Shoe. Dry weight unknown, but weight 104-107 kg last 6 months. 2. Chronic hypoxic respiratory failure maintained on 2-3 L per min nasal cannula continuously. Remains stable. 3. History of chronic kidney disease stage III. Creatinine stable today at 2.39 which is well above baseline which seems to be 1.5. Continue to hold demadex. Provided with gentle IV fluids for 2 days with no improvement. Will request renal consult.  4. History of coronary artery disease with history of multiple stent procedures. Quiescent. Troponins negative. No chest pain.  5. Diabetes mellitus type 2 uncontrolled by hemoglobin A1c 10.7. Blood sugars stable. 6. OSA on CPAP  Code Status: full Family Communication: none present Disposition Plan: home hopefully tomorrow   Consultants:  Nephrology   Procedures: 2-D echocardiogram: Left ventricular ejection fraction 65-70%. Normal  wall motion. No regional wall motion abnormalities. Grade 1 diastolic dysfunction    Antibiotics:  none  HPI/Subjective: Sitting up in bed eating. Reports not feeling well. "i feel like you do when blood sugar is low." (CBG 165). Reports that she needs to stay in hospital until Monday as people who have keys to her house have gone out of town.   Objective: Filed Vitals:   03/11/14 0537  BP: 150/49  Pulse: 62  Temp: 97.4 F (36.3 C)  Resp: 12    Intake/Output Summary (Last 24 hours) at 03/11/14 0849 Last data filed at 03/10/14 1700  Gross per 24 hour  Intake    720 ml  Output      0 ml  Net    720 ml   Filed Weights   03/08/14 0535 03/09/14 0656 03/10/14 0552  Weight: 106.641 kg (235 lb 1.6 oz) 107.321 kg (236 lb 9.6 oz) 103.329 kg (227 lb 12.8 oz)    Exam:   General:  Obese pleasant NAD  Cardiovascular: RRR No MGR trace LE edema  Respiratory: normal effort BS clear bilaterally no wheeze  Abdomen: obese soft +BS non-tender to palpation  Musculoskeletal: fair muscle tone. No clubbing or cyanosis   Data Reviewed: Basic Metabolic Panel:  Recent Labs Lab 03/07/14 0944 03/08/14 0616 03/09/14 0537 03/10/14 0541 03/11/14 0557  NA 140 140 142 142 142  K 4.0 3.7 3.8 3.6* 4.5  CL 94* 94* 95* 97 101  CO2 37* 36* 36* 34* 31  GLUCOSE 324* 207* 179* 189* 221*  BUN 21 26* 32* 39* 45*  CREATININE 1.60* 1.78* 2.05* 2.39* 2.39*  CALCIUM 9.4 9.2 9.0 8.6 8.9  MG 1.6  --   --   --   --  Liver Function Tests:  Recent Labs Lab 03/09/14 1403  ALBUMIN 3.2*   No results found for this basename: LIPASE, AMYLASE,  in the last 168 hours No results found for this basename: AMMONIA,  in the last 168 hours CBC:  Recent Labs Lab 03/05/14 1025 03/06/14 0550  WBC 7.2 6.2  NEUTROABS 4.2  --   HGB 11.4* 10.1*  HCT 36.6 32.5*  MCV 78.7 78.5  PLT 240 214   Cardiac Enzymes:  Recent Labs Lab 03/05/14 1025 03/05/14 1747 03/05/14 2330  TROPONINI <0.30 <0.30 <0.30    BNP (last 3 results)  Recent Labs  03/05/14 1025  PROBNP 1239.0*   CBG:  Recent Labs Lab 03/10/14 0722 03/10/14 1117 03/10/14 1642 03/10/14 2033 03/11/14 0731  GLUCAP 150* 152* 175* 257* 165*    Recent Results (from the past 240 hour(s))  URINE CULTURE     Status: None   Collection Time    03/05/14 11:06 AM      Result Value Ref Range Status   Specimen Description URINE, CLEAN CATCH   Final   Special Requests NONE   Final   Culture  Setup Time     Final   Value: 03/06/2014 02:40     Performed at [facility]     Final   Value: 5,000 COLONIES/ML     Performed at [facility]   Culture     Final   Value: INSIGNIFICANT GROWTH     Performed at [facility]   Report Status 03/07/2014 FINAL   Final     Studies: Dg Chest Port 1 View  03/09/2014   CLINICAL DATA:  Shortness of breath, diabetes, coronary artery disease post MI, hypertension  EXAM: PORTABLE CHEST - 1 VIEW  COMPARISON:  Portable exam 1503 hr compared to 03/05/2014  FINDINGS: Borderline enlargement of cardiac silhouette.  Pulmonary vascular congestion.  Mediastinal contours normal.  Lungs grossly clear.  No pleural effusion or pneumothorax.  Bones unremarkable.  IMPRESSION: No acute abnormalities.  Borderline enlargement of cardiac silhouette.   Electronically Signed   By: Lavonia Dana M.D.   On: 03/09/2014 16:29    Scheduled Meds: . aspirin EC  81 mg Oral q morning - 10a  . atorvastatin  10 mg Oral q1800  . budesonide-formoterol  2 puff Inhalation BID  . clopidogrel  75 mg Oral q morning - 10a  . enoxaparin (LOVENOX) injection  30 mg Subcutaneous Q24H  . ezetimibe  10 mg Oral q morning - 10a  . famotidine  20 mg Oral BID  . ferrous sulfate  325 mg Oral Daily  . insulin aspart  0-20 Units Subcutaneous TID WC  . insulin aspart  0-5 Units Subcutaneous QHS  . insulin aspart  10 Units Subcutaneous TID WC  . insulin glargine  40 Units Subcutaneous BID  . isosorbide  mononitrate  30 mg Oral Q2000  . isosorbide mononitrate  90 mg Oral Daily  . loratadine  10 mg Oral Daily  . meclizine  25 mg Oral TID  . nebivolol  5 mg Oral BID  . sodium chloride  3 mL Intravenous Q12H  . sodium chloride  3 mL Intravenous Q12H  . spironolactone  12.5 mg Oral Daily   Continuous Infusions:   Principal Problem:   CHF, acute on chronic Active Problems:   HYPERLIPIDEMIA   HYPERTENSION   CAD, prior LAD/RCA/CFX stents, cath 09/12/12 with patent stents, low risk BNuc 01/14/13   COPD, chronic O2  GERD   SLEEP APNEA, on C-pap   Acute on chronic diastolic heart failure - with SSx of Unstable Angina   CKD (chronic kidney disease) stage 3, GFR 30-59 ml/min   Leg edema   Acute on chronic renal failure    Time spent: 35 minutes    [facility] Pager [phone]. If 7PM-7AM, please contact night-coverage at [email or web address], password [facility] 03/11/2014, 8:49 AM  LOS: 6 days

## 2014-03-11 NOTE — Progress Notes (Signed)
Patient seen and examined.  Agree with note as above.  Patient still feels weak.  She does not have any new complaints today.  Physical exam is unchanged from yesterday Creatinine has failed to improve despite IV hydration.  Nephrology consult appreciated.  She has been restarted on demadex.  Once renal function starts to improve, can consider discharge home.  CHF appears stable at this time.  Krystal [name]

## 2014-03-11 NOTE — Consult Note (Addendum)
Reason for Consult: Acute kidney injury Referring Physician: Dr. [name] is an 78 y.o. female.  HPI: She is a patient who has history of diabetes for more than 6 years, degenerative joint disease, diastolic dysfunction and sleep apnea presently came with complaints of difficulty breathing, orthopnea and increased leg swelling. Last couple of weeks. According to patient she has recurrent CHF the last 3 months and she has been on diuretics. Presently she seems to be feeling better. She has some nausea but no vomiting her appetite overall is good. He also patient seemed to have elevated creatinine for some time patient denies any previous history of renal failure, proteinuria or kidney stone. Patient also denies any diabetic retinopathy except glaucoma.  Past Medical History  Diagnosis Date  . Sleep apnea     on C-pap  . Occult blood in stools   . Myalgia   . Abnormal PFT   . DM (diabetes mellitus)     type II uncontrolled  . Palpitations   . Microalbuminuria   . Microcytic anemia   . DJD (degenerative joint disease)   . Constipation   . CAD (coronary artery disease) 2009    5 stents- #3 in RCA, #1 each in LAD and AVG  . Osteoarthritis   . Low back pain   . HTN (hypertension)   . Hyperlipidemia   . GERD (gastroesophageal reflux disease)   . Allergic rhinitis   . Personal history of colonic polyps   . HOH (hard of hearing)   . MI (myocardial infarction) 87  . Glaucoma   . Diastolic dysfunction, left ventricle 09/12/12  . Cancer 04/2010       . Acute renal failure due to procedure 09/18/2012  . CKD (chronic kidney disease) stage 3, GFR 30-59 ml/min 09/18/2012  . Heart block 02/2013    transient  . OSA on CPAP   . On home O2     2L N/C continuously    Past Surgical History  Procedure Laterality Date  . Ectopic pregnancy surgery    . Foot surgery      left and right for callous  . Cervical discectomy      L5 left/hemilminectomy  . Colonoscopy  09/2006   int hemmorhoids, COMPLICATED BY CARDIOPULMONARY COMPLICATIONS  . Abdominal hysterectomy  04/2010    uterine cancer,TAHBSO  . Polypectomy  10/22/2011    internal hemorrhoids/sessile polyp  . Coronary angioplasty  1/99, 1/07, 1/08, 4/09    5 cardiac stents total    Family History  Problem Relation Age of Onset  . Anesthesia problems Neg Hx   . Hypotension Neg Hx   . Malignant hyperthermia Neg Hx   . Pseudochol deficiency Neg Hx     Social History:  reports that she quit smoking about 7 years ago. Her smoking use included Cigarettes. She smoked 0.40 packs per day. She has never used smokeless tobacco. She reports that she does not drink alcohol or use illicit drugs.  Allergies:  Allergies  Allergen Reactions  . Morphine Shortness Of Breath and Swelling  . Penicillins Shortness Of Breath and Swelling    Medications: I have reviewed the patient's current medications.  Results for orders placed during the hospital encounter of 03/05/14 (from the past 48 hour(s))  GLUCOSE, CAPILLARY     Status: Abnormal   Collection Time    03/09/14 11:07 AM      Result Value Ref Range   Glucose-Capillary 267 (*) 70 - 99 mg/dL  Comment 1 Notify RN    ALBUMIN     Status: Abnormal   Collection Time    03/09/14  2:03 PM      Result Value Ref Range   Albumin 3.2 (*) 3.5 - 5.2 g/dL  GLUCOSE, CAPILLARY     Status: Abnormal   Collection Time    03/09/14  4:39 PM      Result Value Ref Range   Glucose-Capillary 160 (*) 70 - 99 mg/dL   Comment 1 Notify RN    GLUCOSE, CAPILLARY     Status: Abnormal   Collection Time    03/09/14  9:35 PM      Result Value Ref Range   Glucose-Capillary 141 (*) 70 - 99 mg/dL  BASIC METABOLIC PANEL     Status: Abnormal   Collection Time    03/10/14  5:41 AM      Result Value Ref Range   Sodium 142  137 - 147 mEq/L   Potassium 3.6 (*) 3.7 - 5.3 mEq/L   Chloride 97  96 - 112 mEq/L   CO2 34 (*) 19 - 32 mEq/L   Glucose, Bld 189 (*) 70 - 99 mg/dL   BUN 39 (*) 6 - 23  mg/dL   Creatinine, Ser 2.39 (*) 0.50 - 1.10 mg/dL   Calcium 8.6  8.4 - 10.5 mg/dL   GFR calc non Af Amer 19 (*) >90 mL/min   GFR calc Af Amer 22 (*) >90 mL/min   Comment: (NOTE)     The eGFR has been calculated using the CKD EPI equation.     This calculation has not been validated in all clinical situations.     eGFR's persistently <90 mL/min signify possible Chronic Kidney     Disease.  GLUCOSE, CAPILLARY     Status: Abnormal   Collection Time    03/10/14  7:22 AM      Result Value Ref Range   Glucose-Capillary 150 (*) 70 - 99 mg/dL   Comment 1 Documented in Chart     Comment 2 Notify RN    GLUCOSE, CAPILLARY     Status: Abnormal   Collection Time    03/10/14 11:17 AM      Result Value Ref Range   Glucose-Capillary 152 (*) 70 - 99 mg/dL   Comment 1 Documented in Chart     Comment 2 Notify RN    GLUCOSE, CAPILLARY     Status: Abnormal   Collection Time    03/10/14  4:42 PM      Result Value Ref Range   Glucose-Capillary 175 (*) 70 - 99 mg/dL   Comment 1 Documented in Chart     Comment 2 Notify RN    GLUCOSE, CAPILLARY     Status: Abnormal   Collection Time    03/10/14  8:33 PM      Result Value Ref Range   Glucose-Capillary 257 (*) 70 - 99 mg/dL   Comment 1 Notify RN     Comment 2 Documented in Chart    BASIC METABOLIC PANEL     Status: Abnormal   Collection Time    03/11/14  5:57 AM      Result Value Ref Range   Sodium 142  137 - 147 mEq/L   Potassium 4.5  3.7 - 5.3 mEq/L   Comment: DELTA CHECK NOTED   Chloride 101  96 - 112 mEq/L   CO2 31  19 - 32 mEq/L   Glucose, Bld 221 (*) 70 -  99 mg/dL   BUN 45 (*) 6 - 23 mg/dL   Creatinine, Ser 2.39 (*) 0.50 - 1.10 mg/dL   Calcium 8.9  8.4 - 10.5 mg/dL   GFR calc non Af Amer 19 (*) >90 mL/min   GFR calc Af Amer 22 (*) >90 mL/min   Comment: (NOTE)     The eGFR has been calculated using the CKD EPI equation.     This calculation has not been validated in all clinical situations.     eGFR's persistently <90 mL/min signify  possible Chronic Kidney     Disease.  GLUCOSE, CAPILLARY     Status: Abnormal   Collection Time    03/11/14  7:31 AM      Result Value Ref Range   Glucose-Capillary 165 (*) 70 - 99 mg/dL   Comment 1 Documented in Chart     Comment 2 Notify RN      Dg Chest Port 1 View  03/09/2014   CLINICAL DATA:  Shortness of breath, diabetes, coronary artery disease post MI, hypertension  EXAM: PORTABLE CHEST - 1 VIEW  COMPARISON:  Portable exam 1503 hr compared to 03/05/2014  FINDINGS: Borderline enlargement of cardiac silhouette.  Pulmonary vascular congestion.  Mediastinal contours normal.  Lungs grossly clear.  No pleural effusion or pneumothorax.  Bones unremarkable.  IMPRESSION: No acute abnormalities.  Borderline enlargement of cardiac silhouette.   Electronically Signed   By: Lavonia Dana M.D.   On: 03/09/2014 16:29    Review of Systems  Respiratory: Positive for shortness of breath. Negative for cough.   Cardiovascular: Positive for chest pain, orthopnea, leg swelling and PND.  Gastrointestinal: Positive for nausea. Negative for vomiting and abdominal pain.  Neurological: Positive for weakness.   Blood pressure 150/49, pulse 62, temperature 97.4 F (36.3 C), temperature source Oral, resp. rate 12, height $RemoveBe'5\' 7"'[id number]$  (1.702 m), weight 103.329 kg (227 lb 12.8 oz), SpO2 98.00%. Physical Exam  Constitutional: She is oriented to person, place, and time. No distress.  Eyes: No scleral icterus.  Neck: No JVD present.  Cardiovascular: Normal rate and regular rhythm.   No murmur heard. Respiratory: No respiratory distress. She has no wheezes. She has rales. She exhibits no tenderness.  GI: She exhibits no distension. There is no tenderness.  Musculoskeletal: She exhibits edema.  Neurological: She is alert and oriented to person, place, and time.    Assessment/Plan: Problem #1 renal failure: Seems to be acute on chronic. Her presently patient of her BUN and creatinine could be secondary to fluid removal  versus secondary to natural progression of her disease. Lastly non-steroidal-induced renal failure cannot ruled out.. Presently except nausea patient doesn't have any other  uremic signs and symptoms. Problem #2 chronic renal failure: For creatinine was 1.8 in 2009, 1.27 on 02/14/2011 in the last one on 4/15 was 1.56. He is patient has chronic renal failure possibly stage III. The etiology for her renal failure could be secondary to diabetes/hypertension. Other etiologies has this moment cannot ruled out. Problem #3 proteinuria: Patient has 100 mg/dL of protein in the urine. This could be secondary to diabetes /hypertension/obesity related proteinuria/NSAID. At this moment other etiologies cannot ruled out. Problem #4 history of CHF: Most likely secondary to uncontrolled salt and fluid intake associated with diastolic dysfunction. Problem #5 coronary artery disease: She status post stent placement. Presently she had episodes of chest pain at this moment feeling better. Problem #6 diabetes Problem #7 hypertension: Her blood pressure seems to be reasonably controlled Problem #8  sleep apnea Plan: We'll do ultrasound of the kidneys We'll check ANA, hepatitis B surface antigen, hepatitis C antibody, compliment. We'll check also 24 hour urine for protein and immunoelectrophoresis. We'll check 25 vitamin D level and intact PTH. We'll check her basic metabolic panel, phosphorus and CBC in the morning. Will start patient on Demadex 40 mg by mouth once a day.  Harriett Sine 03/11/2014, 9:49 AM

## 2014-03-12 ENCOUNTER — Ambulatory Visit: Payer: Medicare Other | Admitting: Endocrinology

## 2014-03-12 DIAGNOSIS — Z0289 Encounter for other administrative examinations: Secondary | ICD-10-CM

## 2014-03-12 LAB — CBC
HCT: 36 % (ref 36.0–46.0)
Hemoglobin: 11.1 g/dL — ABNORMAL LOW (ref 12.0–15.0)
MCH: 23.9 pg — AB (ref 26.0–34.0)
MCHC: 30.8 g/dL (ref 30.0–36.0)
MCV: 77.6 fL — AB (ref 78.0–100.0)
Platelets: 219 10*3/uL (ref 150–400)
RBC: 4.64 MIL/uL (ref 3.87–5.11)
RDW: 15.7 % — AB (ref 11.5–15.5)
WBC: 7.2 10*3/uL (ref 4.0–10.5)

## 2014-03-12 LAB — GLUCOSE, CAPILLARY
GLUCOSE-CAPILLARY: 185 mg/dL — AB (ref 70–99)
GLUCOSE-CAPILLARY: 275 mg/dL — AB (ref 70–99)
Glucose-Capillary: 187 mg/dL — ABNORMAL HIGH (ref 70–99)
Glucose-Capillary: 253 mg/dL — ABNORMAL HIGH (ref 70–99)
Glucose-Capillary: 266 mg/dL — ABNORMAL HIGH (ref 70–99)

## 2014-03-12 LAB — PTH, INTACT AND CALCIUM
Calcium, Total (PTH): 8.6 mg/dL (ref 8.4–10.5)
PTH: 242 pg/mL — ABNORMAL HIGH (ref 14.0–72.0)

## 2014-03-12 LAB — BASIC METABOLIC PANEL
BUN: 40 mg/dL — AB (ref 6–23)
CHLORIDE: 101 meq/L (ref 96–112)
CO2: 33 meq/L — AB (ref 19–32)
Calcium: 9.4 mg/dL (ref 8.4–10.5)
Creatinine, Ser: 2.22 mg/dL — ABNORMAL HIGH (ref 0.50–1.10)
GFR calc non Af Amer: 20 mL/min — ABNORMAL LOW (ref >90)
GFR, EST AFRICAN AMERICAN: 24 mL/min — AB (ref >90)
Glucose, Bld: 216 mg/dL — ABNORMAL HIGH (ref 70–99)
POTASSIUM: 4.2 meq/L (ref 3.7–5.3)
Sodium: 144 mEq/L (ref 137–147)

## 2014-03-12 LAB — ANA: ANA: NEGATIVE

## 2014-03-12 LAB — HEPATITIS B SURFACE ANTIGEN: HEP B S AG: NEGATIVE

## 2014-03-12 LAB — PHOSPHORUS: Phosphorus: 3.5 mg/dL (ref 2.3–4.6)

## 2014-03-12 LAB — HEPATITIS C ANTIBODY: HCV AB: NEGATIVE

## 2014-03-12 NOTE — Progress Notes (Signed)
Placed PT  On CPAP hs

## 2014-03-12 NOTE — Progress Notes (Signed)
Patient seen and examined. The above note reviewed.  Patient appears to be feeling better today. Renal function is mildly improved. We'll continue to follow. She's been restarted on Demadex. Anticipate discharge home tomorrow if labs continued to improve.  [employer]

## 2014-03-12 NOTE — Progress Notes (Signed)
[facility] PROGRESS NOTE  Krystal [name] HYQ:[id number] DOB: [birth year] DOA: 03/05/2014 PCP: [name], MD Summary  78 year old woman with history of coronary artery disease with multiple stent procedures, chronic diastolic heart failure with lower extremity edema presented with increasing shortness of breath and lower extremity edema and chest pain. Admitted for acute on chronic CHF related to med non-compliance and diet indiscretions. Given IV lasix. May have been over diuresed and developed persistent ARF. Nephrology consulted  Assessment/Plan: 1. Acute on chronic diastolic congestive heart failure, suspect progressively worsening over time secondary to underlying disease. Volume status -6.1L. Weight 103.3 kg. Transitioned to demadex.  Urine output good. She has a follow up appointment 03/23/14 with Dr. Jacinta Shoe. Dry weight unknown, but weight 104-107 kg last 6 months. 2. Chronic hypoxic respiratory failure maintained on 2-3 L per min nasal cannula continuously. Remains stable. 3. Acute on  chronic kidney disease stage III. Improved. Baseline likely changed. Appreciate renal assistance. Will follow up with renal on 03/27/14  4. History of coronary artery disease with history of multiple stent procedures. Quiescent. Troponins negative. No chest pain.  5. Diabetes mellitus type 2 uncontrolled by hemoglobin A1c 10.7. Blood sugars stable. 6. OSA on CPAP   Code Status: full Family Communication: none present Disposition Plan: home tomorrow   Consultants:  nephrology  Procedures: 2-D echocardiogram: Left ventricular ejection fraction 65-70%. Normal wall motion. No regional wall motion abnormalities. Grade 1 diastolic dysfunction    Antibiotics:  None   HPI/Subjective: Resting comfortably. Easily aroused. Reports "weakness" today  Objective: Filed Vitals:   03/12/14 0632  BP: 149/60  Pulse: 60  Temp: 97.9 F (36.6 C)  Resp: 12    Intake/Output Summary (Last 24  hours) at 03/12/14 1336 Last data filed at 03/12/14 1200  Gross per 24 hour  Intake    480 ml  Output   2100 ml  Net  -1620 ml   Filed Weights   03/09/14 0656 03/10/14 0552 03/12/14 9528  Weight: 107.321 kg (236 lb 9.6 oz) 103.329 kg (227 lb 12.8 oz) 103.3 kg (227 lb 11.8 oz)    Exam:   General:  Obese NAD  Cardiovascular: RRR No MGR trace LE edema  Respiratory: normal effort somewhat shallow BS diminished but clear  Abdomen: obese soft +BS   Musculoskeletal: no clubbing or cyanosis   Data Reviewed: Basic Metabolic Panel:  Recent Labs Lab 03/07/14 0944 03/08/14 0616 03/09/14 0537 03/10/14 0541 03/11/14 0557 03/11/14 1102 03/12/14 0541  NA 140 140 142 142 142  --  144  K 4.0 3.7 3.8 3.6* 4.5  --  4.2  CL 94* 94* 95* 97 101  --  101  CO2 37* 36* 36* 34* 31  --  33*  GLUCOSE 324* 207* 179* 189* 221*  --  216*  BUN 21 26* 32* 39* 45*  --  40*  CREATININE 1.60* 1.78* 2.05* 2.39* 2.39*  --  2.22*  CALCIUM 9.4 9.2 9.0 8.6 8.9 8.6 9.4  MG 1.6  --   --   --   --   --   --   PHOS  --   --   --   --   --   --  3.5   Liver Function Tests:  Recent Labs Lab 03/09/14 1403  ALBUMIN 3.2*   No results found for this basename: LIPASE, AMYLASE,  in the last 168 hours No results found for this basename: AMMONIA,  in the last 168 hours CBC:  Recent Labs Lab 03/06/14  0550 03/12/14 0541  WBC 6.2 7.2  HGB 10.1* 11.1*  HCT 32.5* 36.0  MCV 78.5 77.6*  PLT 214 219   Cardiac Enzymes:  Recent Labs Lab 03/05/14 1747 03/05/14 2330  TROPONINI <0.30 <0.30   BNP (last 3 results)  Recent Labs  03/05/14 1025  PROBNP 1239.0*   CBG:  Recent Labs Lab 03/11/14 1129 03/11/14 1707 03/11/14 2200 03/12/14 0718 03/12/14 1109  GLUCAP 213* 187* 187* 185* 253*    Recent Results (from the past 240 hour(s))  URINE CULTURE     Status: None   Collection Time    03/05/14 11:06 AM      Result Value Ref Range Status   Specimen Description URINE, CLEAN CATCH   Final    Special Requests NONE   Final   Culture  Setup Time     Final   Value: 03/06/2014 02:40     Performed at [facility] Count     Final   Value: 5,000 COLONIES/ML     Performed at [facility]   Culture     Final   Value: INSIGNIFICANT GROWTH     Performed at [facility]   Report Status 03/07/2014 FINAL   Final     Studies: US Renal  03/11/2014   CLINICAL DATA:  Renal failure, history hypertension, MI  EXAM: RENAL/URINARY TRACT ULTRASOUND COMPLETE  COMPARISON:  None  FINDINGS: Right Kidney:  Length: 9.8 cm. Normal cortical thickness. Slightly increased cortical echogenicity. No mass, hydronephrosis or shadowing calcification.  Left Kidney:  Length: 9.7 cm. Suboptimally visualized due to body habitus. Slightly lobulated contours. Normal cortical thickness. Question increased cortical echogenicity. No mass, hydronephrosis or shadowing calcification seen on limited assessment.  Bladder:  Normally distended without definite mass. Left ureteral jet visualized. Right ureteral jet not seen.  IMPRESSION: Question medical renal disease changes.  No evidence of renal mass or hydronephrosis.   Electronically Signed   By: Lavonia Dana M.D.   On: 03/11/2014 16:40    Scheduled Meds: . aspirin EC  81 mg Oral q morning - 10a  . atorvastatin  10 mg Oral q1800  . budesonide-formoterol  2 puff Inhalation BID  . clopidogrel  75 mg Oral q morning - 10a  . enoxaparin (LOVENOX) injection  30 mg Subcutaneous Q24H  . ezetimibe  10 mg Oral q morning - 10a  . famotidine  20 mg Oral Daily  . ferrous sulfate  325 mg Oral Daily  . insulin aspart  0-20 Units Subcutaneous TID WC  . insulin aspart  0-5 Units Subcutaneous QHS  . insulin aspart  10 Units Subcutaneous TID WC  . insulin glargine  40 Units Subcutaneous BID  . isosorbide mononitrate  30 mg Oral Q2000  . isosorbide mononitrate  90 mg Oral Daily  . loratadine  10 mg Oral Daily  . meclizine  25 mg Oral TID  . nebivolol  5 mg  Oral BID  . sodium chloride  3 mL Intravenous Q12H  . sodium chloride  3 mL Intravenous Q12H  . spironolactone  12.5 mg Oral Daily  . torsemide  40 mg Oral Daily   Continuous Infusions:   Principal Problem:   CHF, acute on chronic Active Problems:   HYPERLIPIDEMIA   HYPERTENSION   CAD, prior LAD/RCA/CFX stents, cath 09/12/12 with patent stents, low risk BNuc 01/14/13   COPD, chronic O2   GERD   SLEEP APNEA, on C-pap   Acute on chronic diastolic heart  failure - with SSx of Unstable Angina   CKD (chronic kidney disease) stage 3, GFR 30-59 ml/min   Leg edema   Acute on chronic renal failure    Time spent: 35 minutes    [facility] Pager [phone]. If 7PM-7AM, please contact night-coverage at [email or web address], password [facility] 03/12/2014, 1:36 PM  LOS: 7 days

## 2014-03-12 NOTE — Progress Notes (Signed)
Physical Therapy Treatment Patient Details Name: Krystal [name] MRN: [id number] DOB: 10/24/1936 Today's Date: 2014/03/23    History of Present Illness Pt has much less dizziness today...it is not really a problem.  She continues to have generalized weakness and she is begrudgingly agreeable to PT today...gait only.    PT Comments    Pt was agreeable to gait only this afternoon.  O2 sat on 3 L O2=98%.  Pt is independent with transfers.  She benefits from the use of a walker as an energy conservation tool and will need one from home.  She was able to ambulate 175' using a walker and O2 with good stability.  She should still be able to transition to home at d/c with HHPT.                  Follow Up Recommendations   HHPT     Equipment Recommendations  Rolling walker with 5" wheels    Recommendations for Other Services  none     Precautions / Restrictions Precautions Precautions: None Restrictions Weight Bearing Restrictions: No    Mobility  Bed Mobility Overal bed mobility: Modified Independent                Transfers Overall transfer level: Modified independent Equipment used: Rolling walker (2 wheeled)                Ambulation/Gait Ambulation/Gait assistance: Modified independent (Device/Increase time) Ambulation Distance (Feet): 175 Feet Assistive device: Rolling walker (2 wheeled) Gait Pattern/deviations: WFL(Within Functional Limits)   Gait velocity interpretation: Below normal speed for age/gender General Gait Details: several rest stops needed...pt instructed in increasing thoracic extension during gait   Stairs:N?T due to pt preference                      Balance Overall balance assessment: Modified Independent                                  Cognition Arousal/Alertness: Awake/alert Behavior During Therapy: WFL for tasks assessed/performed Overall Cognitive Status: Within Functional Limits for  tasks assessed                      Exercises  not done per pt preference                                                   PT Goals (current goals can now be found in the care plan section) Progress towards PT goals: Progressing toward goals           PT Plan Equipment recommendations need to be updated     End of Session Equipment Utilized During Treatment: Gait belt;Oxygen Activity Tolerance: Patient tolerated treatment well Patient left: in bed;with call bell/phone within reach    Time: 1320-1346 PT Time Calculation (min): 26 min  Charges:  $Gait Training: 8-22 mins                    G Codes:      Sable Feil Mar 23, 2014, 1:57 PM

## 2014-03-12 NOTE — Progress Notes (Signed)
Subjective: Interval History: has no complaint of nausea or vomiting. Patient states that she's feeling better. She denies any cough but she has is still exertional dyspnea..  Objective: Vital signs in last 24 hours: Temp:  [97.4 F (36.3 C)-97.9 F (36.6 C)] 97.9 F (36.6 C) (04/24 5409) Pulse Rate:  [60-72] 60 (04/24 0632) Resp:  [12-18] 12 (04/24 0632) BP: (149-162)/(60-78) 149/60 mmHg (04/24 0632) SpO2:  [93 %-100 %] 94 % (04/24 0731) Weight:  [103.3 kg (227 lb 11.8 oz)] 103.3 kg (227 lb 11.8 oz) (04/24 8119) Weight change:   Intake/Output from previous day: 04/23 0701 - 04/24 0700 In: 480 [P.O.:480] Out: 2150 [Urine:2150] Intake/Output this shift: Total I/O In: -  Out: 400 [Urine:400]  General appearance: alert, cooperative and no distress Resp: diminished breath sounds bilaterally Cardio: regular rate and rhythm, S1, S2 normal, no murmur, click, rub or gallop GI: soft, non-tender; bowel sounds normal; no masses,  no organomegaly Extremities: edema Trace edema  Lab Results:  Recent Labs  03/12/14 0541  WBC 7.2  HGB 11.1*  HCT 36.0  PLT 219   BMET:  Recent Labs  03/11/14 0557 03/12/14 0541  NA 142 144  K 4.5 4.2  CL 101 101  CO2 31 33*  GLUCOSE 221* 216*  BUN 45* 40*  CREATININE 2.39* 2.22*  CALCIUM 8.9 9.4   No results found for this basename: PTH,  in the last 72 hours Iron Studies: No results found for this basename: IRON, TIBC, TRANSFERRIN, FERRITIN,  in the last 72 hours  Studies/Results: US Renal  03/11/2014   CLINICAL DATA:  Renal failure, history hypertension, MI  EXAM: RENAL/URINARY TRACT ULTRASOUND COMPLETE  COMPARISON:  None  FINDINGS: Right Kidney:  Length: 9.8 cm. Normal cortical thickness. Slightly increased cortical echogenicity. No mass, hydronephrosis or shadowing calcification.  Left Kidney:  Length: 9.7 cm. Suboptimally visualized due to body habitus. Slightly lobulated contours. Normal cortical thickness. Question increased cortical  echogenicity. No mass, hydronephrosis or shadowing calcification seen on limited assessment.  Bladder:  Normally distended without definite mass. Left ureteral jet visualized. Right ureteral jet not seen.  IMPRESSION: Question medical renal disease changes.  No evidence of renal mass or hydronephrosis.   Electronically Signed   By: Lavonia Dana M.D.   On: 03/11/2014 16:40    I have reviewed the patient's current medications.  Assessment/Plan: Problem #1 acute kidney injury: Her BUN and creatinine is improving. Patient doesn't have any uremic sinus symptoms. Problem #2 CHF: Patient urine output is good and see seems to be improving. Problem #3 chronic renal failure: Her ultrasound showed bilaterally small kidneys and some increased echogenicity. Patient seems to have underlying stage III chronic renal failure. Most likely secondary to diabetes/hypertension/nonsteroidal Problem #4 proteinuria : None nephrotic range Problem #5 sleep apnea Problem #6 hypertension her blood pressure seems to be reasonably controlled Problem #7 coronary artery disease.  Problem #8 CHF: Patient on Demadex with good urine output. Plan: We'll continue his present management. The patient is going to discharged I will follow her as an outpatient. We'll check her basic metabolic panel in the morning.  LOS: 7 days   Ulric Salzman S Damoni Erker 03/12/2014,8:55 AM

## 2014-03-13 LAB — BASIC METABOLIC PANEL
BUN: 44 mg/dL — ABNORMAL HIGH (ref 6–23)
CHLORIDE: 96 meq/L (ref 96–112)
CO2: 35 mEq/L — ABNORMAL HIGH (ref 19–32)
Calcium: 9.6 mg/dL (ref 8.4–10.5)
Creatinine, Ser: 2.13 mg/dL — ABNORMAL HIGH (ref 0.50–1.10)
GFR calc non Af Amer: 22 mL/min — ABNORMAL LOW (ref >90)
GFR, EST AFRICAN AMERICAN: 25 mL/min — AB (ref >90)
Glucose, Bld: 291 mg/dL — ABNORMAL HIGH (ref 70–99)
POTASSIUM: 3.8 meq/L (ref 3.7–5.3)
SODIUM: 142 meq/L (ref 137–147)

## 2014-03-13 LAB — GLUCOSE, CAPILLARY
Glucose-Capillary: 244 mg/dL — ABNORMAL HIGH (ref 70–99)
Glucose-Capillary: 253 mg/dL — ABNORMAL HIGH (ref 70–99)

## 2014-03-13 LAB — C4 COMPLEMENT: COMPLEMENT C4, BODY FLUID: 48 mg/dL — AB (ref 10–40)

## 2014-03-13 LAB — C3 COMPLEMENT: C3 COMPLEMENT: 136 mg/dL (ref 90–180)

## 2014-03-13 MED ORDER — CALCITRIOL 0.25 MCG PO CAPS
0.2500 ug | ORAL_CAPSULE | Freq: Every day | ORAL | Status: DC
  Administered 2014-03-13: 0.25 ug via ORAL
  Filled 2014-03-13: qty 1

## 2014-03-13 NOTE — Progress Notes (Signed)
Pt taken to main entrance with family via wheelchair by staff member

## 2014-03-13 NOTE — Progress Notes (Addendum)
Subjective: Interval History: has no complaint of nausea or vomiting. Patient states that she's feeling much better. No shortness of breath and no cough  Objective: Vital signs in last 24 hours: Temp:  [97.9 F (36.6 C)-98.5 F (36.9 C)] 98.1 F (36.7 C) (04/25 0630) Pulse Rate:  [63-71] 63 (04/25 0630) Resp:  [16-20] 18 (04/25 0630) BP: (148-149)/(41-82) 149/50 mmHg (04/25 0630) SpO2:  [93 %-100 %] 93 % (04/25 0630) FiO2 (%):  [99 %] 99 % (04/25 0739) Weight:  [107.593 kg (237 lb 3.2 oz)] 107.593 kg (237 lb 3.2 oz) (04/25 0630) Weight change: 4.293 kg (9 lb 7.4 oz)  Intake/Output from previous day: 04/24 0701 - 04/25 0700 In: 720 [P.O.:720] Out: 1750 [Urine:1750] Intake/Output this shift: Total I/O In: 3 [I.V.:3] Out: -   General appearance: alert, cooperative and no distress Resp: diminished breath sounds bilaterally Cardio: regular rate and rhythm, S1, S2 normal, no murmur, click, rub or gallop GI: soft, non-tender; bowel sounds normal; no masses,  no organomegaly Extremities: edema Trace edema  Lab Results:  Recent Labs  03/12/14 0541  WBC 7.2  HGB 11.1*  HCT 36.0  PLT 219   BMET:   Recent Labs  03/12/14 0541 03/13/14 0636  NA 144 142  K 4.2 3.8  CL 101 96  CO2 33* 35*  GLUCOSE 216* 291*  BUN 40* 44*  CREATININE 2.22* 2.13*  CALCIUM 9.4 9.6    Recent Labs  03/11/14 1102  PTH 242.0*   Iron Studies: No results found for this basename: IRON, TIBC, TRANSFERRIN, FERRITIN,  in the last 72 hours  Studies/Results: US Renal  03/11/2014   CLINICAL DATA:  Renal failure, history hypertension, MI  EXAM: RENAL/URINARY TRACT ULTRASOUND COMPLETE  COMPARISON:  None  FINDINGS: Right Kidney:  Length: 9.8 cm. Normal cortical thickness. Slightly increased cortical echogenicity. No mass, hydronephrosis or shadowing calcification.  Left Kidney:  Length: 9.7 cm. Suboptimally visualized due to body habitus. Slightly lobulated contours. Normal cortical thickness. Question  increased cortical echogenicity. No mass, hydronephrosis or shadowing calcification seen on limited assessment.  Bladder:  Normally distended without definite mass. Left ureteral jet visualized. Right ureteral jet not seen.  IMPRESSION: Question medical renal disease changes.  No evidence of renal mass or hydronephrosis.   Electronically Signed   By: Lavonia Dana M.D.   On: 03/11/2014 16:40    I have reviewed the patient's current medications.  Assessment/Plan: Problem #1 acute kidney injury: Her BUN and creatinine is slowly improving but still above her base line. Patient doesn't have any uremic sinus symptoms. Problem #2 CHF: Patient urine output is about 1700 on Demadex and improving. Problem #3 chronic renal failure: . Patient seems to have underlying stage III chronic renal failure. Most likely secondary to diabetes/hypertension/nonsteroidal Problem #4 proteinuria : None nephrotic range Problem #5 sleep apnea Problem #6 hypertension her blood pressure seems to be reasonably controlled Problem #7 coronary artery disease. Patient is asymptomatic Problem #8 metabolic bone disease: Calcium and phosphorus isn't range. PTH however is high. Plan: We'll continue his present management. Patient also advised not to use any non-steroidal. Will start patient on Rocaltrol 0.25 mcg by mouth once a day. Once patient is discharged will follow her as an outpatient.  LOS: 8 days   Naveh Rickles S Simran Mannis 03/13/2014,8:38 AM

## 2014-03-13 NOTE — Discharge Summary (Signed)
Physician Discharge Summary  MARIEANNE MARXEN PXT:[id number] DOB: Mar 31, 1936 DOA: 03/05/2014  PCP: [name], MD  Admit date: 03/05/2014 Discharge date: 03/13/2014  Time spent: 45 minutes  Recommendations for Outpatient Follow-up:  1. Followup with primary care physician in one to 2 weeks 2. Follow up with nephrology in 4 weeks 3. Patient has a followup appointment on 03/23/14 with Dr. Pennelope Bracken, Cardiology 4. Home health services has been arranged   Discharge Diagnoses:  Principal Problem:   Acute on chronic diastolic congestive heart failure Active Problems:   HYPERLIPIDEMIA   HYPERTENSION   CAD, prior LAD/RCA/CFX stents, cath 09/12/12 with patent stents, low risk BNuc 01/14/13   COPD, chronic O2   GERD   SLEEP APNEA, on C-pap   CKD (chronic kidney disease) stage 3, GFR 30-59 ml/min   Leg edema   Acute on chronic renal failure Acute on chronic respiratory failure   Discharge Condition: improved  Diet recommendation: low salt  Filed Weights   03/10/14 0552 03/12/14 0632 03/13/14 0630  Weight: 103.329 kg (227 lb 12.8 oz) 103.3 kg (227 lb 11.8 oz) 107.593 kg (237 lb 3.2 oz)    History of present illness:  Krystal [name] is a delightful 78 y.o. female with a past medical history that includes CAD status post 5 stents in 2009, hypertension, diastolic dysfunction, chronic kidney disease stage III, anemia, presents to the emergency department with a chief complaint of worsening shortness of breath and worsening lower extremity edema. Initial workup in the emergency department reveals elevated BNP consistent with acute on chronic diastolic heart failure.  Patient reports over the last several days she has experienced worsening dyspnea on exertion and lower extremity edema. Ordinarily she requires 4 pillows to sleep in the last 3 nights she has had to sleep sitting up in the chair. In addition she has noticed worsening dyspnea on exertion and lower extremity edema. She indicates  that she turned up her oxygen with no relief. He denies chest pain palpitations nausea vomiting abdominal pain. She denies fever chills headache coughing. She indicates that she has been eating and drinking her normal amount and has been avoiding salt. She states that she saw her cardiologist in March of this year and and he stopped her amlodipine and changed her imdur and bystolic. She reports no improvement in her lower extremity edema with those changes. She has been more active than usual the last 2 days she's had 2 friends die and she has been helping arrange funerals.  Initial workup in the emergency department reveals a BNP of 1239, troponin negative and EKG with normal sinus rhythm possible left atrial enlargement. Creatinine 1.49 serum glucose of 387. Urinalysis with few bacteria many squamas cell, 7-10 WBC  She is hemodynamically stable, afebrile and oxygen saturation 99% on 3 L nasal  In the emergency department she was given 40 mg of Lasix intravenously.   Hospital Course:  This patient was admitted to the hospital with acute on chronic respiratory failure due to acute on chronic diastolic congestive heart failure. Patient was initially diuresed with intravenous Lasix and subsequently transitioned back to oral Demadex. Her respiratory status appears to be back to baseline. She is chronically maintained on 2-3 L of oxygen. It appears that the patient was consuming excessive amounts of fluids at home. She was educated regarding the importance of fluid or stricture as well as medication compliance. She'll follow up with cardiology in the coming weeks.  The patient did have elevation of her creatinine during this  admission. Initially was felt to be related to diuresis. She was seen by nephrology we'll further adjust her medications and restarted her diuretics. At this time, her creatinine has started to slowly improve. She'll be followed up in 4 weeks' time by nephrology. She has been cleared for  discharge by nephrology. History of coronary artery disease. Stable. Troponins negative. Type 2 diabetes. Remained stable during her hospital stay.  Procedures: Echo:- Normal LV chamber size with mild LVH and LVEF 65-70%. Grade 1 diastolic dysfunction with increased filling pressures. Upper normal left atrial chamber size. MAC with mild mitral regurgitation. Trivial tricuspid regurgitation. Mildly increased PASP 37 mm mercury. Trivial pericardial effusion.    Consultations:  Nephrology  Discharge Exam: Filed Vitals:   03/13/14 1330  BP: 157/57  Pulse: 67  Temp: 98 F (36.7 C)  Resp: 18    General: NAd Cardiovascular: S1, S2 RRR Respiratory: CTA B  Discharge Instructions You were cared for by a hospitalist during your hospital stay. If you have any questions about your discharge medications or the care you received while you were in the hospital after you are discharged, you can call the unit and asked to speak with the hospitalist on call if the hospitalist that took care of you is not available. Once you are discharged, your primary care physician will handle any further medical issues. Please note that NO REFILLS for any discharge medications will be authorized once you are discharged, as it is imperative that you return to your primary care physician (or establish a relationship with a primary care physician if you do not have one) for your aftercare needs so that they can reassess your need for medications and monitor your lab values.  Discharge Orders   Future Appointments Provider Department Dept Phone   03/23/2014 10:00 AM [name], MD [place] [phone]   Future Orders Complete By Expires   (HEART FAILURE PATIENTS) Call MD:  Anytime you have any of the following symptoms: 1) 3 pound weight gain in 24 hours or 5 pounds in 1 week 2) shortness of breath, with or without a dry hacking cough 3) swelling in the hands, feet or stomach 4) if you  have to sleep on extra pillows at night in order to breathe.  As directed    Diet - low sodium heart healthy  As directed    Diet Carb Modified  As directed    Face-to-face encounter (required for Medicare/Medicaid patients)  As directed    Questions:     The encounter with the patient was in whole, or in part, for the following medical condition, which is the primary reason for home health care:  chf exacerbation   I certify that, based on my findings, the following services are medically necessary home health services:  Nursing   Physical therapy   My clinical findings support the need for the above services:  Shortness of breath with activity   Further, I certify that my clinical findings support that this patient is homebound due to:  Shortness of Breath with activity   Reason for Medically Necessary Home Health Services:  [facility]  As directed    Questions:     To provide the following care/treatments:  PT   OT   RN   Home Health Aide   Increase activity slowly  As directed        Medication List    STOP taking these medications  Canagliflozin 100 MG Tabs  Commonly known as:  INVOKANA      TAKE these medications       ADVAIR DISKUS 250-50 MCG/DOSE Aepb  Generic drug:  Fluticasone-Salmeterol  Inhale 1 puff into the lungs 2 (two) times daily.     aspirin 81 MG EC tablet  Take 81 mg by mouth every morning.     budesonide-formoterol 160-4.5 MCG/ACT inhaler  Commonly known as:  SYMBICORT  Inhale 2 puffs into the lungs 2 (two) times daily as needed (for shortness of breath).     calcitRIOL 0.25 MCG capsule  Commonly known as:  ROCALTROL  Take 0.25 mcg by mouth daily. Take on Monday, Wednesday, and Friday.     clopidogrel 75 MG tablet  Commonly known as:  PLAVIX  Take 75 mg by mouth every morning.     ezetimibe 10 MG tablet  Commonly known as:  ZETIA  Take 10 mg by mouth every morning.     famotidine 20 MG  tablet  Commonly known as:  PEPCID  Take 1 tablet (20 mg total) by mouth 2 (two) times daily.     ferrous sulfate 325 (65 FE) MG tablet  Take 325 mg by mouth daily.     HYDROcodone-acetaminophen 5-500 MG per tablet  Commonly known as:  VICODIN  Take 1 tablet by mouth 2 (two) times daily as needed for pain.     insulin aspart 100 UNIT/ML injection  Commonly known as:  novoLOG  Inject 45 Units into the skin 2 (two) times daily. 45 units in the morning and 40 units in the evening.     insulin glargine 100 UNIT/ML injection  Commonly known as:  LANTUS  Inject 40 Units into the skin 2 (two) times daily.     isosorbide mononitrate 60 MG 24 hr tablet  Commonly known as:  IMDUR  Take 1 & 1/2 tablet in the morning and 1/2 tablet in the PM.     loratadine 10 MG tablet  Commonly known as:  CLARITIN  Take 10 mg by mouth daily as needed for allergies.     nebivolol 10 MG tablet  Commonly known as:  BYSTOLIC  Take 5-78 mg by mouth 2 (two) times daily. 1 & 1/2 tablets in the morning and 1/5 tablet in the evening.     NITROSTAT 0.4 MG SL tablet  Generic drug:  nitroGLYCERIN  Take 1 tablet by mouth every 5 (five) minutes x 3 doses as needed. For chest pains     nystatin-triamcinolone ointment  Commonly known as:  MYCOLOG  Apply 1 application topically 2 (two) times daily. To affected area.     OXYGEN-HELIUM IN  Inhale into the lungs. 2 liters continuous     RANEXA 1000 MG SR tablet  Generic drug:  ranolazine  TAKE (1) TABLET TWICE DAILY.     rosuvastatin 20 MG tablet  Commonly known as:  CRESTOR  Take 20 mg by mouth every evening.     spironolactone 25 MG tablet  Commonly known as:  ALDACTONE  Take 1/2 tablet daily     torsemide 20 MG tablet  Commonly known as:  DEMADEX  Take 2 tablets twice daily for 2 days then 2 tablets in the morning and 1 in the afternoon     VICTOZA 18 MG/3ML Sopn  Generic drug:  Liraglutide  Inject 3 mLs into the skin daily.       Allergies   Allergen Reactions  . Morphine Shortness Of Breath and  Swelling  . Penicillins Shortness Of Breath and Swelling       Follow-up Information   Follow up with [facility], MD In 4 weeks.   Specialty:  Nephrology   Contact information:   [address] [place] [address] [phone]       Follow up with cardiology will call you with follow up appointment.       The results of significant diagnostics from this hospitalization (including imaging, microbiology, ancillary and laboratory) are listed below for reference.    Significant Diagnostic Studies: US Renal  03/11/2014   CLINICAL DATA:  Renal failure, history hypertension, MI  EXAM: RENAL/URINARY TRACT ULTRASOUND COMPLETE  COMPARISON:  None  FINDINGS: Right Kidney:  Length: 9.8 cm. Normal cortical thickness. Slightly increased cortical echogenicity. No mass, hydronephrosis or shadowing calcification.  Left Kidney:  Length: 9.7 cm. Suboptimally visualized due to body habitus. Slightly lobulated contours. Normal cortical thickness. Question increased cortical echogenicity. No mass, hydronephrosis or shadowing calcification seen on limited assessment.  Bladder:  Normally distended without definite mass. Left ureteral jet visualized. Right ureteral jet not seen.  IMPRESSION: Question medical renal disease changes.  No evidence of renal mass or hydronephrosis.   Electronically Signed   By: Lavonia Dana M.D.   On: 03/11/2014 16:40   Dg Chest Port 1 View  03/09/2014   CLINICAL DATA:  Shortness of breath, diabetes, coronary artery disease post MI, hypertension  EXAM: PORTABLE CHEST - 1 VIEW  COMPARISON:  Portable exam 1503 hr compared to 03/05/2014  FINDINGS: Borderline enlargement of cardiac silhouette.  Pulmonary vascular congestion.  Mediastinal contours normal.  Lungs grossly clear.  No pleural effusion or pneumothorax.  Bones unremarkable.  IMPRESSION: No acute abnormalities.  Borderline enlargement of cardiac silhouette.    Electronically Signed   By: Lavonia Dana M.D.   On: 03/09/2014 16:29   Dg Chest Port 1 View  03/05/2014   CLINICAL DATA:  Shortness of breath for 2 weeks  EXAM: PORTABLE CHEST - 1 VIEW  COMPARISON:  DG CHEST 1V PORT dated 01/25/2013  FINDINGS: Borderline cardiomegaly reidentified. Mild prominence of the interstitial markings is again noted, without focal pulmonary opacity. No pleural effusion. No acute osseous finding.  IMPRESSION: No acute cardiopulmonary process.  Mild cardiomegaly reidentified.   Electronically Signed   By: Conchita Paris M.D.   On: 03/05/2014 10:56    Microbiology: Recent Results (from the past 240 hour(s))  URINE CULTURE     Status: None   Collection Time    03/05/14 11:06 AM      Result Value Ref Range Status   Specimen Description URINE, CLEAN CATCH   Final   Special Requests NONE   Final   Culture  Setup Time     Final   Value: 03/06/2014 02:40     Performed at [facility]     Final   Value: 5,000 COLONIES/ML     Performed at [facility]   Culture     Final   Value: INSIGNIFICANT GROWTH     Performed at [facility]   Report Status 03/07/2014 FINAL   Final     Labs: Basic Metabolic Panel:  Recent Labs Lab 03/07/14 0944  03/09/14 0537 03/10/14 0541 03/11/14 0557 03/11/14 1102 03/12/14 0541 03/13/14 0636  NA 140  < > 142 142 142  --  144 142  K 4.0  < > 3.8 3.6* 4.5  --  4.2 3.8  CL 94*  < >  95* 97 101  --  101 96  CO2 37*  < > 36* 34* 31  --  33* 35*  GLUCOSE 324*  < > 179* 189* 221*  --  216* 291*  BUN 21  < > 32* 39* 45*  --  40* 44*  CREATININE 1.60*  < > 2.05* 2.39* 2.39*  --  2.22* 2.13*  CALCIUM 9.4  < > 9.0 8.6 8.9 8.6 9.4 9.6  MG 1.6  --   --   --   --   --   --   --   PHOS  --   --   --   --   --   --  3.5  --   < > = values in this interval not displayed. Liver Function Tests:  Recent Labs Lab 03/09/14 1403  ALBUMIN 3.2*   No results found for this basename: LIPASE, AMYLASE,  in the last  168 hours No results found for this basename: AMMONIA,  in the last 168 hours CBC:  Recent Labs Lab 03/12/14 0541  WBC 7.2  HGB 11.1*  HCT 36.0  MCV 77.6*  PLT 219   Cardiac Enzymes: No results found for this basename: CKTOTAL, CKMB, CKMBINDEX, TROPONINI,  in the last 168 hours BNP: BNP (last 3 results)  Recent Labs  03/05/14 1025  PROBNP 1239.0*   CBG:  Recent Labs Lab 03/12/14 1109 03/12/14 1619 03/12/14 2106 03/13/14 0734 03/13/14 1139  GLUCAP 253* 266* 275* 244* 253*       Signed:  Jolaine Artist Somya Jauregui  [facility] 03/13/2014, 7:37 PM

## 2014-03-13 NOTE — Discharge Instructions (Signed)
Heart Failure °Heart failure is a condition in which the heart has trouble pumping blood. This means your heart does not pump blood efficiently for your body to work well. In some cases of heart failure, fluid may back up into your lungs or you may have swelling (edema) in your lower legs. Heart failure is usually a long-term (chronic) condition. It is important for you to take good care of yourself and follow your caregiver's treatment plan. °CAUSES  °Some health conditions can cause heart failure. Those health conditions include: °· High blood pressure (hypertension) causes the heart muscle to work harder than normal. When pressure in the blood vessels is high, the heart needs to pump (contract) with more force in order to circulate blood throughout the body. High blood pressure eventually causes the heart to become stiff and weak. °· Coronary artery disease (CAD) is the buildup of cholesterol and fat (plaque) in the arteries of the heart. The blockage in the arteries deprives the heart muscle of oxygen and blood. This can cause chest pain and may lead to a heart attack. High blood pressure can also contribute to CAD. °· Heart attack (myocardial infarction) occurs when 1 or more arteries in the heart become blocked. The loss of oxygen damages the muscle tissue of the heart. When this happens, part of the heart muscle dies. The injured tissue does not contract as well and weakens the heart's ability to pump blood. °· Abnormal heart valves can cause heart failure when the heart valves do not open and close properly. This makes the heart muscle pump harder to keep the blood flowing. °· Heart muscle disease (cardiomyopathy or myocarditis) is damage to the heart muscle from a variety of causes. These can include drug or alcohol abuse, infections, or unknown reasons. These can increase the risk of heart failure. °· Lung disease makes the heart work harder because the lungs do not work properly. This can cause a strain  on the heart, leading it to fail. °· Diabetes increases the risk of heart failure. High blood sugar contributes to high fat (lipid) levels in the blood. Diabetes can also cause slow damage to tiny blood vessels that carry important nutrients to the heart muscle. When the heart does not get enough oxygen and food, it can cause the heart to become weak and stiff. This leads to a heart that does not contract efficiently. °· Other conditions can contribute to heart failure. These include abnormal heart rhythms, thyroid problems, and low blood counts (anemia). °Certain unhealthy behaviors can increase the risk of heart failure. Those unhealthy behaviors include: °· Being overweight. °· Smoking or chewing tobacco. °· Eating foods high in fat and cholesterol. °· Abusing illicit drugs or alcohol. °· Lacking physical activity. °SYMPTOMS  °Heart failure symptoms may vary and can be hard to detect. Symptoms may include: °· Shortness of breath with activity, such as climbing stairs. °· Persistent cough. °· Swelling of the feet, ankles, legs, or abdomen. °· Unexplained weight gain. °· Difficulty breathing when lying flat (orthopnea). °· Waking from sleep because of the need to sit up and get more air. °· Rapid heartbeat. °· Fatigue and loss of energy. °· Feeling lightheaded, dizzy, or close to fainting. °· Loss of appetite. °· Nausea. °· Increased urination during the night (nocturia). °DIAGNOSIS  °A diagnosis of heart failure is based on your history, symptoms, physical examination, and diagnostic tests. °Diagnostic tests for heart failure may include: °· Echocardiography. °· Electrocardiography. °· Chest X-ray. °· Blood tests. °· Exercise   stress test. °· Cardiac angiography. °· Radionuclide scans. °TREATMENT  °Treatment is aimed at managing the symptoms of heart failure. Medicines, behavioral changes, or surgical intervention may be necessary to treat heart failure. °· Medicines to help treat heart failure may  include: °· Angiotensin-converting enzyme (ACE) inhibitors. This type of medicine blocks the effects of a blood protein called angiotensin-converting enzyme. ACE inhibitors relax (dilate) the blood vessels and help lower blood pressure. °· Angiotensin receptor blockers. This type of medicine blocks the actions of a blood protein called angiotensin. Angiotensin receptor blockers dilate the blood vessels and help lower blood pressure. °· Water pills (diuretics). Diuretics cause the kidneys to remove salt and water from the blood. The extra fluid is removed through urination. This loss of extra fluid lowers the volume of blood the heart pumps. °· Beta blockers. These prevent the heart from beating too fast and improve heart muscle strength. °· Digitalis. This increases the force of the heartbeat. °· Healthy behavior changes include: °· Obtaining and maintaining a healthy weight. °· Stopping smoking or chewing tobacco. °· Eating heart healthy foods. °· Limiting or avoiding alcohol. °· Stopping illicit drug use. °· Physical activity as directed by your caregiver. °· Surgical treatment for heart failure may include: °· A procedure to open blocked arteries, repair damaged heart valves, or remove damaged heart muscle tissue. °· A pacemaker to improve heart muscle function and control certain abnormal heart rhythms. °· An internal cardioverter defibrillator to treat certain serious abnormal heart rhythms. °· A left ventricular assist device to assist the pumping ability of the heart. °HOME CARE INSTRUCTIONS  °· Take your medicine as directed by your caregiver. Medicines are important in reducing the workload of your heart, slowing the progression of heart failure, and improving your symptoms. °· Do not stop taking your medicine unless directed by your caregiver. °· Do not skip any dose of medicine. °· Refill your prescriptions before you run out of medicine. Your medicines are needed every day. °· Take over-the-counter  medicine only as directed by your caregiver or pharmacist. °· Engage in moderate physical activity if directed by your caregiver. Moderate physical activity can benefit some people. The elderly and people with severe heart failure should consult with a caregiver for physical activity recommendations. °· Eat heart healthy foods. Food choices should be free of trans fat and low in saturated fat, cholesterol, and salt (sodium). Healthy choices include fresh or frozen fruits and vegetables, fish, lean meats, legumes, fat-free or low-fat dairy products, and whole grain or high fiber foods. Talk to a dietitian to learn more about heart healthy foods. °· Limit sodium if directed by your caregiver. Sodium restriction may reduce symptoms of heart failure in some people. Talk to a dietitian to learn more about heart healthy seasonings. °· Use healthy cooking methods. Healthy cooking methods include roasting, grilling, broiling, baking, poaching, steaming, or stir-frying. Talk to a dietitian to learn more about healthy cooking methods. °· Limit fluids if directed by your caregiver. Fluid restriction may reduce symptoms of heart failure in some people. °· Weigh yourself every day. Daily weights are important in the early recognition of excess fluid. You should weigh yourself every morning after you urinate and before you eat breakfast. Wear the same amount of clothing each time you weigh yourself. Record your daily weight. Provide your caregiver with your weight record. °· Monitor and record your blood pressure if directed by your caregiver. °· Check your pulse if directed by your caregiver. °· Lose weight if directed   by your caregiver. Weight loss may reduce symptoms of heart failure in some people. °· Stop smoking or chewing tobacco. Nicotine makes your heart work harder by causing your blood vessels to constrict. Do not use nicotine gum or patches before talking to your caregiver. °· Schedule and attend follow-up visits as  directed by your caregiver. It is important to keep all your appointments. °· Limit alcohol intake to no more than 1 drink per day for nonpregnant women and 2 drinks per day for men. Drinking more than that is harmful to your heart. Tell your caregiver if you drink alcohol several times a week. Talk with your caregiver about whether alcohol is safe for you. If your heart has already been damaged by alcohol or you have severe heart failure, drinking alcohol should be stopped completely. °· Stop illicit drug use. °· Stay up-to-date with immunizations. It is especially important to prevent respiratory infections through current pneumococcal and influenza immunizations. °· Manage other health conditions such as hypertension, diabetes, thyroid disease, or abnormal heart rhythms as directed by your caregiver. °· Learn to manage stress. °· Plan rest periods when fatigued. °· Learn strategies to manage high temperatures. If the weather is extremely hot: °· Avoid vigorous physical activity. °· Use air conditioning or fans or seek a cooler location. °· Avoid caffeine and alcohol. °· Wear loose-fitting, lightweight, and light-colored clothing. °· Learn strategies to manage cold temperatures. If the weather is extremely cold: °· Avoid vigorous physical activity. °· Layer clothes. °· Wear mittens or gloves, a hat, and a scarf when going outside. °· Avoid alcohol. °· Obtain ongoing education and support as needed. °· Participate or seek rehabilitation as needed to maintain or improve independence and quality of life. °SEEK MEDICAL CARE IF:  °· Your weight increases by 03 lb/1.4 kg in 1 day or 05 lb/2.3 kg in a week. °· You have increasing shortness of breath that is unusual for you. °· You are unable to participate in your usual physical activities. °· You tire easily. °· You cough more than normal, especially with physical activity. °· You have any or more swelling in areas such as your hands, feet, ankles, or abdomen. °· You  are unable to sleep because it is hard to breathe. °· You feel like your heart is beating fast (palpitations). °· You become dizzy or lightheaded upon standing up. °SEEK IMMEDIATE MEDICAL CARE IF:  °· You have difficulty breathing. °· There is a change in mental status such as decreased alertness or difficulty with concentration. °· You have a pain or discomfort in your chest. °· You have an episode of fainting (syncope). °MAKE SURE YOU:  °· Understand these instructions. °· Will watch your condition. °· Will get help right away if you are not doing well or get worse. °Document Released: 11/05/2005 Document Revised: 03/02/2013 Document Reviewed: 11/27/2012 °[employer]® Patient Information ©2014 [employer]. ° °

## 2014-03-13 NOTE — Progress Notes (Addendum)
IV removed earlier today, site WNL.  Pt given d/c instructions and new prescriptions.  Discussed all home medications (when, how, and why to take), patient verbalizes understanding. Discussed home care with patient, teachback completed. F/U appointment in place, pt states they will keep appointment. Pt is stable at this time and desires to go home. [facility] notified of patients discharge, patient set up with PT, OT, RN, and home health aid.  Patient has O2 at home.

## 2014-03-15 LAB — COMPLEMENT, TOTAL

## 2014-03-16 LAB — UIFE/LIGHT CHAINS/TP QN, 24-HR UR
ALPHA 1 UR: DETECTED — AB
ALPHA 2 UR: DETECTED — AB
Albumin, U: DETECTED
Beta, Urine: DETECTED — AB
FREE KAPPA LT CHAINS, UR: 0.83 mg/dL (ref 0.14–2.42)
FREE KAPPA/LAMBDA RATIO: 5.53 ratio (ref 2.04–10.37)
FREE LT CHN EXCR RATE: 27.6 mg/d
Free Lambda Excretion/Day: 4.99 mg/d
Free Lambda Lt Chains,Ur: 0.15 mg/dL (ref 0.02–0.67)
Gamma Globulin, Urine: DETECTED — AB
TOTAL PROTEIN, URINE-UPE24: 13.1 mg/dL
Time: 24 hours
Total Protein, Urine-Ur/day: 436 mg/d — ABNORMAL HIGH (ref 10–140)
Volume, Urine: 3325 mL

## 2014-03-17 DIAGNOSIS — M545 Low back pain, unspecified: Secondary | ICD-10-CM | POA: Diagnosis not present

## 2014-03-17 DIAGNOSIS — IMO0001 Reserved for inherently not codable concepts without codable children: Secondary | ICD-10-CM | POA: Diagnosis not present

## 2014-03-17 DIAGNOSIS — I129 Hypertensive chronic kidney disease with stage 1 through stage 4 chronic kidney disease, or unspecified chronic kidney disease: Secondary | ICD-10-CM | POA: Diagnosis not present

## 2014-03-17 DIAGNOSIS — E119 Type 2 diabetes mellitus without complications: Secondary | ICD-10-CM | POA: Diagnosis not present

## 2014-03-17 DIAGNOSIS — M25559 Pain in unspecified hip: Secondary | ICD-10-CM | POA: Diagnosis not present

## 2014-03-17 DIAGNOSIS — I2510 Atherosclerotic heart disease of native coronary artery without angina pectoris: Secondary | ICD-10-CM | POA: Diagnosis not present

## 2014-03-22 ENCOUNTER — Ambulatory Visit: Payer: Medicare Other | Admitting: Cardiovascular Disease

## 2014-03-23 ENCOUNTER — Ambulatory Visit (INDEPENDENT_AMBULATORY_CARE_PROVIDER_SITE_OTHER): Payer: Medicare Other | Admitting: Cardiovascular Disease

## 2014-03-23 ENCOUNTER — Encounter: Payer: Self-pay | Admitting: Cardiovascular Disease

## 2014-03-23 VITALS — BP 118/95 | HR 77 | Ht 67.0 in | Wt 227.0 lb

## 2014-03-23 DIAGNOSIS — I2510 Atherosclerotic heart disease of native coronary artery without angina pectoris: Secondary | ICD-10-CM

## 2014-03-23 DIAGNOSIS — Z9289 Personal history of other medical treatment: Secondary | ICD-10-CM

## 2014-03-23 DIAGNOSIS — I5032 Chronic diastolic (congestive) heart failure: Secondary | ICD-10-CM

## 2014-03-23 DIAGNOSIS — Z87898 Personal history of other specified conditions: Secondary | ICD-10-CM

## 2014-03-23 DIAGNOSIS — E119 Type 2 diabetes mellitus without complications: Secondary | ICD-10-CM

## 2014-03-23 DIAGNOSIS — R5383 Other fatigue: Secondary | ICD-10-CM

## 2014-03-23 DIAGNOSIS — R5381 Other malaise: Secondary | ICD-10-CM | POA: Diagnosis not present

## 2014-03-23 DIAGNOSIS — R079 Chest pain, unspecified: Secondary | ICD-10-CM

## 2014-03-23 DIAGNOSIS — I10 Essential (primary) hypertension: Secondary | ICD-10-CM

## 2014-03-23 DIAGNOSIS — E669 Obesity, unspecified: Secondary | ICD-10-CM

## 2014-03-23 DIAGNOSIS — E785 Hyperlipidemia, unspecified: Secondary | ICD-10-CM

## 2014-03-23 DIAGNOSIS — Z79899 Other long term (current) drug therapy: Secondary | ICD-10-CM

## 2014-03-23 LAB — BASIC METABOLIC PANEL
BUN: 40 mg/dL — AB (ref 6–23)
CO2: 29 mEq/L (ref 19–32)
Calcium: 9.4 mg/dL (ref 8.4–10.5)
Chloride: 98 mEq/L (ref 96–112)
Creat: 2.62 mg/dL — ABNORMAL HIGH (ref 0.50–1.10)
Glucose, Bld: 217 mg/dL — ABNORMAL HIGH (ref 70–99)
POTASSIUM: 4.3 meq/L (ref 3.5–5.3)
SODIUM: 139 meq/L (ref 135–145)

## 2014-03-23 LAB — MAGNESIUM: Magnesium: 1.7 mg/dL (ref 1.5–2.5)

## 2014-03-23 NOTE — Progress Notes (Signed)
Patient ID: Krystal [name], female   DOB: 04/23/1936, 78 y.o.   MRN: [id number]      SUBJECTIVE: The patient is a 78 year old woman who I am evaluating for the first time. She was recently hospitalized for acute on chronic diastolic heart failure. She has a history of hypertension, coronary artery disease with multiple previously placed stents, COPD and is on chronic oxygen at home, sleep apnea and uses CPAP, hyperlipidemia, and insulin-dependent diabetes mellitus. Echocardiogram on 03/05/2014 demonstrated vigorous left ventricular systolic function, EF 38-25%, mild LVH, grade 1 diastolic dysfunction, elevated filling pressures, mild mitral regurgitation, trivial tricuspid regurgitation, and mildly elevated pulmonary pressures of 37 mmHg. As per Dr. Evette Georges note on 02/14/14, she has coronary artery disease dating back to the 1990s and has undergone multiple stent procedures involving the proximal LAD, circumflex, mid right coronary artery, and distal right coronary artery. Her last catheterization in October 2013 did not reveal high-grade restenosis. She did have 80-90% ostial stenosis and a small jailed first septal perforating artery. She had mild calcification and 20-30% narrowing in the stent in the circumflex vessel. The right coronary artery had 3 stents and there was 40-50% in-stent smooth narrowing with 30% narrowing in the distal stent.   On 4/25, BUN 44, creatinine 2.13. Discharge weight 237 lbs on 4/25. Today she weighs 227 lbs in our office.  She is tearful and frustrated about her health. She chronically uses 3 L of oxygen and has not had to increase this lately. She urinates about 3 times per day. She had some chest pain on Sunday for which she took one sublingual nitroglycerin and it was alleviated. She also said that the chest pain improved with taking deep breaths. She said that her body has felt "jumpy" and she has had difficulty sleeping. She said that while her leg swelling has  improved, she still feels some mild leg swelling.  She did not bring her medications or a list of her medications. As per the discharge summary, with respect to her diuretic regimen, she is taking spironolactone 12.5 mg daily and 40 mg of torsemide in the morning and 20 mg in the afternoon.    Allergies  Allergen Reactions  . Morphine Shortness Of Breath and Swelling  . Penicillins Shortness Of Breath and Swelling    Current Outpatient Prescriptions  Medication Sig Dispense Refill  . aspirin 81 MG EC tablet Take 81 mg by mouth every morning.       . budesonide-formoterol (SYMBICORT) 160-4.5 MCG/ACT inhaler Inhale 2 puffs into the lungs 2 (two) times daily as needed (for shortness of breath).      . calcitRIOL (ROCALTROL) 0.25 MCG capsule Take 0.25 mcg by mouth daily. Take on Monday, Wednesday, and Friday.      . clopidogrel (PLAVIX) 75 MG tablet Take 75 mg by mouth every morning.       . ezetimibe (ZETIA) 10 MG tablet Take 10 mg by mouth every morning.       . famotidine (PEPCID) 20 MG tablet Take 1 tablet (20 mg total) by mouth 2 (two) times daily.  60 tablet  9  . ferrous sulfate 325 (65 FE) MG tablet Take 325 mg by mouth daily.       . Fluticasone-Salmeterol (ADVAIR DISKUS) 250-50 MCG/DOSE AEPB Inhale 1 puff into the lungs 2 (two) times daily.       Marland Kitchen HYDROcodone-acetaminophen (VICODIN) 5-500 MG per tablet Take 1 tablet by mouth 2 (two) times daily as needed for pain.       Marland Kitchen  insulin aspart (NOVOLOG) 100 UNIT/ML injection Inject 45 Units into the skin 2 (two) times daily. 45 units in the morning and 40 units in the evening.      . insulin glargine (LANTUS) 100 UNIT/ML injection Inject 40 Units into the skin 2 (two) times daily.       . isosorbide mononitrate (IMDUR) 60 MG 24 hr tablet Take 1 & 1/2 tablet in the morning and 1/2 tablet in the PM.  60 tablet  3  . Liraglutide (VICTOZA) 18 MG/3ML SOPN Inject 3 mLs into the skin daily.       Marland Kitchen loratadine (CLARITIN) 10 MG tablet Take 10 mg by  mouth daily as needed for allergies.      Marland Kitchen nebivolol (BYSTOLIC) 10 MG tablet Take 5-15 mg by mouth 2 (two) times daily. 1 & 1/2 tablets in the morning and 1/5 tablet in the evening.      Marland Kitchen NITROSTAT 0.4 MG SL tablet Take 1 tablet by mouth every 5 (five) minutes x 3 doses as needed. For chest pains      . nystatin-triamcinolone ointment (MYCOLOG) Apply 1 application topically 2 (two) times daily. To affected area.  30 g  prn  . OXYGEN-HELIUM IN Inhale into the lungs. 2 liters continuous      . RANEXA 1000 MG SR tablet TAKE (1) TABLET TWICE DAILY.  60 tablet  10  . rosuvastatin (CRESTOR) 20 MG tablet Take 20 mg by mouth every evening.      Marland Kitchen spironolactone (ALDACTONE) 25 MG tablet Take 1/2 tablet daily  30 tablet  6  . torsemide (DEMADEX) 20 MG tablet Take 2 tablets twice daily for 2 days then 2 tablets in the morning and 1 in the afternoon  94 tablet  6   No current facility-administered medications for this visit.    Past Medical History  Diagnosis Date  . Sleep apnea     on C-pap  . Occult blood in stools   . Myalgia   . Abnormal PFT   . DM (diabetes mellitus)     type II uncontrolled  . Palpitations   . Microalbuminuria   . Microcytic anemia   . DJD (degenerative joint disease)   . Constipation   . CAD (coronary artery disease) 2009    5 stents- #3 in RCA, #1 each in LAD and AVG  . Osteoarthritis   . Low back pain   . HTN (hypertension)   . Hyperlipidemia   . GERD (gastroesophageal reflux disease)   . Allergic rhinitis   . Personal history of colonic polyps   . HOH (hard of hearing)   . MI (myocardial infarction) 30  . Glaucoma   . Diastolic dysfunction, left ventricle 09/12/12  . Cancer 04/2010       . Acute renal failure due to procedure 09/18/2012  . CKD (chronic kidney disease) stage 3, GFR 30-59 ml/min 09/18/2012  . Heart block 02/2013    transient  . OSA on CPAP   . On home O2     2L N/C continuously    Past Surgical History  Procedure Laterality Date  .  Ectopic pregnancy surgery    . Foot surgery      left and right for callous  . Cervical discectomy      L5 left/hemilminectomy  . Colonoscopy  09/2006    int hemmorhoids, COMPLICATED BY CARDIOPULMONARY COMPLICATIONS  . Abdominal hysterectomy  04/2010    uterine cancer,TAHBSO  . Polypectomy  10/22/2011  internal hemorrhoids/sessile polyp  . Coronary angioplasty  1/99, 1/07, 1/08, 4/09    5 cardiac stents total    History   Social History  . Marital Status: Widowed    Spouse Name: N/A    Number of Children: N/A  . Years of Education: N/A   Occupational History  . Not on file.   Social History Main Topics  . Smoking status: Former Smoker -- 0.40 packs/day    Types: Cigarettes    Quit date: 10/18/2006  . Smokeless tobacco: Never Used     Comment: quit about 4 yrs ago  . Alcohol Use: No  . Drug Use: No  . Sexual Activity: No   Other Topics Concern  . Not on file   Social History Narrative  . No narrative on file     Filed Vitals:   03/23/14 1013  Height: 5\' 7"  (1.702 m)  Weight: 227 lb (102.967 kg)    PHYSICAL EXAM General: NAD, using oxygen by nasal cannula, tearful. Neck: No JVD, no thyromegaly. Lungs: Diminished bilaterally with faint end-expiratory wheezes. No rales appreciated. CV: Nondisplaced PMI.  Regular rate and rhythm, normal S1/S2, no S3/S4, no murmur. Trace periankle edema b/l.  No carotid bruit.  Abdomen: Soft, nontender, no hepatosplenomegaly, no distention.  Neurologic: Alert and oriented x 3.  Psych: Normal affect. Extremities: No clubbing or cyanosis.   ECG: reviewed and available in electronic records.      ASSESSMENT AND PLAN: 1. Chronic diastolic heart failure: I will check a basic metabolic panel and a serum magnesium level to see if either hypokalemia or hypomagnesemia is causing her to experience body jerking. I've asked her to come firm the medication she is taking with [place] and she plans to bring them in later today or tomorrow.  For the time being I will not make any adjustments to her medications. 2. CAD: She underwent a low risk, normal nuclear stress test in September 2014. Continue current therapy with aspirin, Plavix, Zetia, Imdur, Bystolic, Crestor, and Ranexa. 3. Hypertension: Well controlled on current therapy. 4. Hyperlipidemia: Continue current therapy with Crestor and Zetia.  Dispo: f/u 2 weeks.  Kate Sable, M.D., F.A.C.C.

## 2014-03-23 NOTE — Patient Instructions (Signed)
Your physician recommends that you schedule a follow-up appointment in: 2 weeks  Please get blood work done today  PLEASE return today with your medications     Thank you for choosing [facility] !

## 2014-03-24 DIAGNOSIS — M545 Low back pain, unspecified: Secondary | ICD-10-CM | POA: Diagnosis not present

## 2014-03-24 DIAGNOSIS — IMO0001 Reserved for inherently not codable concepts without codable children: Secondary | ICD-10-CM | POA: Diagnosis not present

## 2014-03-24 DIAGNOSIS — M25559 Pain in unspecified hip: Secondary | ICD-10-CM | POA: Diagnosis not present

## 2014-03-24 DIAGNOSIS — I2510 Atherosclerotic heart disease of native coronary artery without angina pectoris: Secondary | ICD-10-CM | POA: Diagnosis not present

## 2014-03-24 DIAGNOSIS — I129 Hypertensive chronic kidney disease with stage 1 through stage 4 chronic kidney disease, or unspecified chronic kidney disease: Secondary | ICD-10-CM | POA: Diagnosis not present

## 2014-03-24 DIAGNOSIS — E119 Type 2 diabetes mellitus without complications: Secondary | ICD-10-CM | POA: Diagnosis not present

## 2014-03-25 ENCOUNTER — Other Ambulatory Visit: Payer: Self-pay | Admitting: Cardiovascular Disease

## 2014-03-25 NOTE — Telephone Encounter (Signed)
Rx was sent to pharmacy electronically. 

## 2014-03-26 ENCOUNTER — Other Ambulatory Visit: Payer: Self-pay

## 2014-03-26 DIAGNOSIS — I2510 Atherosclerotic heart disease of native coronary artery without angina pectoris: Secondary | ICD-10-CM | POA: Diagnosis not present

## 2014-03-26 DIAGNOSIS — M545 Low back pain, unspecified: Secondary | ICD-10-CM | POA: Diagnosis not present

## 2014-03-26 DIAGNOSIS — E119 Type 2 diabetes mellitus without complications: Secondary | ICD-10-CM | POA: Diagnosis not present

## 2014-03-26 DIAGNOSIS — M25559 Pain in unspecified hip: Secondary | ICD-10-CM | POA: Diagnosis not present

## 2014-03-26 DIAGNOSIS — I129 Hypertensive chronic kidney disease with stage 1 through stage 4 chronic kidney disease, or unspecified chronic kidney disease: Secondary | ICD-10-CM | POA: Diagnosis not present

## 2014-03-26 DIAGNOSIS — IMO0001 Reserved for inherently not codable concepts without codable children: Secondary | ICD-10-CM | POA: Diagnosis not present

## 2014-03-26 MED ORDER — CLOPIDOGREL BISULFATE 75 MG PO TABS: 75.0000 mg | ORAL_TABLET | Freq: Every day | ORAL | Status: DC

## 2014-03-26 NOTE — Telephone Encounter (Signed)
Rx was sent to pharmacy electronically. 

## 2014-03-27 DIAGNOSIS — I10 Essential (primary) hypertension: Secondary | ICD-10-CM | POA: Diagnosis not present

## 2014-03-27 DIAGNOSIS — I509 Heart failure, unspecified: Secondary | ICD-10-CM | POA: Diagnosis not present

## 2014-03-27 DIAGNOSIS — N184 Chronic kidney disease, stage 4 (severe): Secondary | ICD-10-CM | POA: Diagnosis not present

## 2014-03-27 DIAGNOSIS — E1129 Type 2 diabetes mellitus with other diabetic kidney complication: Secondary | ICD-10-CM | POA: Diagnosis not present

## 2014-03-27 DIAGNOSIS — R809 Proteinuria, unspecified: Secondary | ICD-10-CM | POA: Diagnosis not present

## 2014-03-29 DIAGNOSIS — M545 Low back pain, unspecified: Secondary | ICD-10-CM | POA: Diagnosis not present

## 2014-03-29 DIAGNOSIS — I2510 Atherosclerotic heart disease of native coronary artery without angina pectoris: Secondary | ICD-10-CM | POA: Diagnosis not present

## 2014-03-29 DIAGNOSIS — E119 Type 2 diabetes mellitus without complications: Secondary | ICD-10-CM | POA: Diagnosis not present

## 2014-03-29 DIAGNOSIS — I129 Hypertensive chronic kidney disease with stage 1 through stage 4 chronic kidney disease, or unspecified chronic kidney disease: Secondary | ICD-10-CM | POA: Diagnosis not present

## 2014-03-29 DIAGNOSIS — IMO0001 Reserved for inherently not codable concepts without codable children: Secondary | ICD-10-CM | POA: Diagnosis not present

## 2014-03-29 DIAGNOSIS — M25559 Pain in unspecified hip: Secondary | ICD-10-CM | POA: Diagnosis not present

## 2014-03-30 DIAGNOSIS — M545 Low back pain, unspecified: Secondary | ICD-10-CM | POA: Diagnosis not present

## 2014-03-30 DIAGNOSIS — IMO0001 Reserved for inherently not codable concepts without codable children: Secondary | ICD-10-CM | POA: Diagnosis not present

## 2014-03-30 DIAGNOSIS — I129 Hypertensive chronic kidney disease with stage 1 through stage 4 chronic kidney disease, or unspecified chronic kidney disease: Secondary | ICD-10-CM | POA: Diagnosis not present

## 2014-03-30 DIAGNOSIS — M25559 Pain in unspecified hip: Secondary | ICD-10-CM | POA: Diagnosis not present

## 2014-03-30 DIAGNOSIS — E119 Type 2 diabetes mellitus without complications: Secondary | ICD-10-CM | POA: Diagnosis not present

## 2014-03-30 DIAGNOSIS — I2510 Atherosclerotic heart disease of native coronary artery without angina pectoris: Secondary | ICD-10-CM | POA: Diagnosis not present

## 2014-03-31 DIAGNOSIS — M545 Low back pain, unspecified: Secondary | ICD-10-CM | POA: Diagnosis not present

## 2014-03-31 DIAGNOSIS — I259 Chronic ischemic heart disease, unspecified: Secondary | ICD-10-CM | POA: Diagnosis not present

## 2014-03-31 DIAGNOSIS — I2510 Atherosclerotic heart disease of native coronary artery without angina pectoris: Secondary | ICD-10-CM | POA: Diagnosis not present

## 2014-03-31 DIAGNOSIS — IMO0001 Reserved for inherently not codable concepts without codable children: Secondary | ICD-10-CM | POA: Diagnosis not present

## 2014-03-31 DIAGNOSIS — I509 Heart failure, unspecified: Secondary | ICD-10-CM | POA: Diagnosis not present

## 2014-03-31 DIAGNOSIS — I129 Hypertensive chronic kidney disease with stage 1 through stage 4 chronic kidney disease, or unspecified chronic kidney disease: Secondary | ICD-10-CM | POA: Diagnosis not present

## 2014-03-31 DIAGNOSIS — J449 Chronic obstructive pulmonary disease, unspecified: Secondary | ICD-10-CM | POA: Diagnosis not present

## 2014-03-31 DIAGNOSIS — E119 Type 2 diabetes mellitus without complications: Secondary | ICD-10-CM | POA: Diagnosis not present

## 2014-03-31 DIAGNOSIS — M25559 Pain in unspecified hip: Secondary | ICD-10-CM | POA: Diagnosis not present

## 2014-03-31 DIAGNOSIS — I10 Essential (primary) hypertension: Secondary | ICD-10-CM | POA: Diagnosis not present

## 2014-04-01 DIAGNOSIS — IMO0001 Reserved for inherently not codable concepts without codable children: Secondary | ICD-10-CM | POA: Diagnosis not present

## 2014-04-01 DIAGNOSIS — M545 Low back pain, unspecified: Secondary | ICD-10-CM | POA: Diagnosis not present

## 2014-04-01 DIAGNOSIS — E119 Type 2 diabetes mellitus without complications: Secondary | ICD-10-CM | POA: Diagnosis not present

## 2014-04-01 DIAGNOSIS — I129 Hypertensive chronic kidney disease with stage 1 through stage 4 chronic kidney disease, or unspecified chronic kidney disease: Secondary | ICD-10-CM | POA: Diagnosis not present

## 2014-04-01 DIAGNOSIS — M25559 Pain in unspecified hip: Secondary | ICD-10-CM | POA: Diagnosis not present

## 2014-04-01 DIAGNOSIS — I2510 Atherosclerotic heart disease of native coronary artery without angina pectoris: Secondary | ICD-10-CM | POA: Diagnosis not present

## 2014-04-02 DIAGNOSIS — M25559 Pain in unspecified hip: Secondary | ICD-10-CM | POA: Diagnosis not present

## 2014-04-02 DIAGNOSIS — M545 Low back pain, unspecified: Secondary | ICD-10-CM | POA: Diagnosis not present

## 2014-04-02 DIAGNOSIS — IMO0001 Reserved for inherently not codable concepts without codable children: Secondary | ICD-10-CM | POA: Diagnosis not present

## 2014-04-02 DIAGNOSIS — I129 Hypertensive chronic kidney disease with stage 1 through stage 4 chronic kidney disease, or unspecified chronic kidney disease: Secondary | ICD-10-CM | POA: Diagnosis not present

## 2014-04-02 DIAGNOSIS — I2510 Atherosclerotic heart disease of native coronary artery without angina pectoris: Secondary | ICD-10-CM | POA: Diagnosis not present

## 2014-04-02 DIAGNOSIS — E119 Type 2 diabetes mellitus without complications: Secondary | ICD-10-CM | POA: Diagnosis not present

## 2014-04-06 DIAGNOSIS — IMO0001 Reserved for inherently not codable concepts without codable children: Secondary | ICD-10-CM | POA: Diagnosis not present

## 2014-04-06 DIAGNOSIS — E119 Type 2 diabetes mellitus without complications: Secondary | ICD-10-CM | POA: Diagnosis not present

## 2014-04-06 DIAGNOSIS — M25559 Pain in unspecified hip: Secondary | ICD-10-CM | POA: Diagnosis not present

## 2014-04-06 DIAGNOSIS — I2510 Atherosclerotic heart disease of native coronary artery without angina pectoris: Secondary | ICD-10-CM | POA: Diagnosis not present

## 2014-04-06 DIAGNOSIS — I129 Hypertensive chronic kidney disease with stage 1 through stage 4 chronic kidney disease, or unspecified chronic kidney disease: Secondary | ICD-10-CM | POA: Diagnosis not present

## 2014-04-06 DIAGNOSIS — M545 Low back pain, unspecified: Secondary | ICD-10-CM | POA: Diagnosis not present

## 2014-04-06 DIAGNOSIS — H109 Unspecified conjunctivitis: Secondary | ICD-10-CM | POA: Diagnosis not present

## 2014-04-06 DIAGNOSIS — H1030 Unspecified acute conjunctivitis, unspecified eye: Secondary | ICD-10-CM | POA: Diagnosis not present

## 2014-04-07 ENCOUNTER — Encounter: Payer: Self-pay | Admitting: Cardiovascular Disease

## 2014-04-07 ENCOUNTER — Ambulatory Visit (INDEPENDENT_AMBULATORY_CARE_PROVIDER_SITE_OTHER): Payer: Medicare Other | Admitting: Cardiovascular Disease

## 2014-04-07 VITALS — BP 144/62 | HR 62 | Ht 67.0 in | Wt 224.0 lb

## 2014-04-07 DIAGNOSIS — Z79899 Other long term (current) drug therapy: Secondary | ICD-10-CM

## 2014-04-07 DIAGNOSIS — I25810 Atherosclerosis of coronary artery bypass graft(s) without angina pectoris: Secondary | ICD-10-CM

## 2014-04-07 DIAGNOSIS — E785 Hyperlipidemia, unspecified: Secondary | ICD-10-CM | POA: Diagnosis not present

## 2014-04-07 DIAGNOSIS — I5032 Chronic diastolic (congestive) heart failure: Secondary | ICD-10-CM

## 2014-04-07 DIAGNOSIS — I2510 Atherosclerotic heart disease of native coronary artery without angina pectoris: Secondary | ICD-10-CM | POA: Diagnosis not present

## 2014-04-07 DIAGNOSIS — I10 Essential (primary) hypertension: Secondary | ICD-10-CM | POA: Diagnosis not present

## 2014-04-07 DIAGNOSIS — J449 Chronic obstructive pulmonary disease, unspecified: Secondary | ICD-10-CM

## 2014-04-07 DIAGNOSIS — E669 Obesity, unspecified: Secondary | ICD-10-CM

## 2014-04-07 MED ORDER — TORSEMIDE 20 MG PO TABS: 20.0000 mg | ORAL_TABLET | Freq: Two times a day (BID) | ORAL | Status: DC

## 2014-04-07 NOTE — Progress Notes (Signed)
Patient ID: Krystal [name], female   DOB: January 04, 1936, 78 y.o.   MRN: [id number]      SUBJECTIVE: Pt underwent blood tests since her last visit revealing normal potassium and magnesium levels. Her BUN/creatinine remain abnormal: BUN 40, creat 2.62. Previously 26/1.78 on 4/20.   She did not bring her meds with her at her last visit, and thus I made no adjustments.  In summary, she is a 78 year old woman who was recently hospitalized for acute on chronic diastolic heart failure. She has a history of hypertension, coronary artery disease with multiple previously placed stents, COPD and is on chronic oxygen at home (3L), sleep apnea and uses CPAP, hyperlipidemia, and insulin-dependent diabetes mellitus. Echocardiogram on 03/05/2014 demonstrated vigorous left ventricular systolic function, EF 94-17%, mild LVH, grade 1 diastolic dysfunction, elevated filling pressures, mild mitral regurgitation, trivial tricuspid regurgitation, and mildly elevated pulmonary pressures of 37 mmHg.  As per Dr. Evette Georges note on 02/14/14, she has coronary artery disease dating back to the 1990s and has undergone multiple stent procedures involving the proximal LAD, circumflex, mid right coronary artery, and distal right coronary artery. Her last catheterization in October 2013 did not reveal high-grade restenosis. She did have 80-90% ostial stenosis and a small jailed first septal perforating artery. She had mild calcification and 20-30% narrowing in the stent in the circumflex vessel. The right coronary artery had 3 stents and there was 40-50% in-stent smooth narrowing with 30% narrowing in the distal stent.   As per the discharge summary, with respect to her diuretic regimen, she is supposed to be taking spironolactone 12.5 mg daily and 40 mg of torsemide in the morning and 20 mg in the afternoon.  She is now feeling better, although she is struggling with allergies (rhinorrhea, itchy and watery eyes). She denies chest pain and  leg swelling.  She would like to stop some of her medications. Rather than 12.5, she has been taking 25 mg spironolactone daily as this is what the bottle actually says.    Allergies  Allergen Reactions  . Morphine Shortness Of Breath and Swelling  . Penicillins Shortness Of Breath and Swelling    Current Outpatient Prescriptions  Medication Sig Dispense Refill  . aspirin 81 MG EC tablet Take 81 mg by mouth every morning.       . budesonide-formoterol (SYMBICORT) 160-4.5 MCG/ACT inhaler Inhale 2 puffs into the lungs 2 (two) times daily as needed (for shortness of breath).      . calcitRIOL (ROCALTROL) 0.25 MCG capsule Take 0.25 mcg by mouth daily. Take on Monday, Wednesday, and Friday.      . clopidogrel (PLAVIX) 75 MG tablet Take 1 tablet (75 mg total) by mouth daily.  30 tablet  1  . ezetimibe (ZETIA) 10 MG tablet Take 10 mg by mouth every morning.       . famotidine (PEPCID) 20 MG tablet Take 1 tablet (20 mg total) by mouth 2 (two) times daily.  60 tablet  9  . ferrous sulfate 325 (65 FE) MG tablet Take 325 mg by mouth daily.       . Fluticasone-Salmeterol (ADVAIR DISKUS) 250-50 MCG/DOSE AEPB Inhale 1 puff into the lungs 2 (two) times daily.       Marland Kitchen HYDROcodone-acetaminophen (VICODIN) 5-500 MG per tablet Take 1 tablet by mouth 2 (two) times daily as needed for pain.       Marland Kitchen insulin aspart (NOVOLOG) 100 UNIT/ML injection Inject 45 Units into the skin 2 (two) times daily. 45 units in  the morning and 40 units in the evening.      . insulin glargine (LANTUS) 100 UNIT/ML injection Inject 40 Units into the skin 2 (two) times daily.       . isosorbide mononitrate (IMDUR) 60 MG 24 hr tablet Take 1 & 1/2 tablet in the morning and 1/2 tablet in the PM.  60 tablet  3  . Liraglutide (VICTOZA) 18 MG/3ML SOPN Inject 3 mLs into the skin daily.       Marland Kitchen loratadine (CLARITIN) 10 MG tablet Take 10 mg by mouth daily as needed for allergies.      Marland Kitchen nebivolol (BYSTOLIC) 10 MG tablet Take 5-15 mg by mouth 2  (two) times daily. 1 & 1/2 tablets in the morning and 1/5 tablet in the evening.      Marland Kitchen NITROSTAT 0.4 MG SL tablet PLACE ONE (1) TABLET UNDER TONGUE EVERY 5 MINUTES UP TO (3) DOSES AS NEEDED FOR CHEST PAIN.  25 tablet  1  . nystatin-triamcinolone ointment (MYCOLOG) Apply 1 application topically 2 (two) times daily. To affected area.  30 g  prn  . OXYGEN-HELIUM IN Inhale into the lungs. 2 liters continuous      . RANEXA 1000 MG SR tablet TAKE (1) TABLET TWICE DAILY.  60 tablet  10  . rosuvastatin (CRESTOR) 20 MG tablet Take 20 mg by mouth every evening.      Marland Kitchen spironolactone (ALDACTONE) 25 MG tablet Take 1/2 tablet daily  30 tablet  6  . torsemide (DEMADEX) 20 MG tablet Take 2 tablets twice daily for 2 days then 2 tablets in the morning and 1 in the afternoon  94 tablet  6   No current facility-administered medications for this visit.    Past Medical History  Diagnosis Date  . Sleep apnea     on C-pap  . Occult blood in stools   . Myalgia   . Abnormal PFT   . DM (diabetes mellitus)     type II uncontrolled  . Palpitations   . Microalbuminuria   . Microcytic anemia   . DJD (degenerative joint disease)   . Constipation   . CAD (coronary artery disease) 2009    5 stents- #3 in RCA, #1 each in LAD and AVG  . Osteoarthritis   . Low back pain   . HTN (hypertension)   . Hyperlipidemia   . GERD (gastroesophageal reflux disease)   . Allergic rhinitis   . Personal history of colonic polyps   . HOH (hard of hearing)   . MI (myocardial infarction) 81  . Glaucoma   . Diastolic dysfunction, left ventricle 09/12/12  . Cancer 04/2010       . Acute renal failure due to procedure 09/18/2012  . CKD (chronic kidney disease) stage 3, GFR 30-59 ml/min 09/18/2012  . Heart block 02/2013    transient  . OSA on CPAP   . On home O2     2L N/C continuously    Past Surgical History  Procedure Laterality Date  . Ectopic pregnancy surgery    . Foot surgery      left and right for callous  .  Cervical discectomy      L5 left/hemilminectomy  . Colonoscopy  09/2006    int hemmorhoids, COMPLICATED BY CARDIOPULMONARY COMPLICATIONS  . Abdominal hysterectomy  04/2010    uterine cancer,TAHBSO  . Polypectomy  10/22/2011    internal hemorrhoids/sessile polyp  . Coronary angioplasty  1/99, 1/07, 1/08, 4/09    5 cardiac  stents total    History   Social History  . Marital Status: Widowed    Spouse Name: N/A    Number of Children: N/A  . Years of Education: N/A   Occupational History  . Not on file.   Social History Main Topics  . Smoking status: Former Smoker -- 0.40 packs/day    Types: Cigarettes    Quit date: 10/18/2006  . Smokeless tobacco: Never Used     Comment: quit about 4 yrs ago  . Alcohol Use: No  . Drug Use: No  . Sexual Activity: No   Other Topics Concern  . Not on file   Social History Narrative  . No narrative on file    BP 144/62 Pulse 62  Wt 224 lbs  PHYSICAL EXAM General: NAD, using oxygen by nasal cannula.  Neck: No JVD, no thyromegaly.  Lungs:Clear b/l. No rales or wheezes appreciated.  CV: Nondisplaced PMI. Regular rate and rhythm, normal S1/S2, no S3/S4, no murmur. No pretibial or periankle edema. Abdomen: Soft, nontender, no hepatosplenomegaly, no distention.  Neurologic: Alert and oriented x 3.  Psych: Normal affect.  Extremities: No clubbing or cyanosis.    ECG: reviewed and available in electronic records.      ASSESSMENT AND PLAN: 1. Chronic diastolic heart failure: She is currently euvolemic and stable. However, given her poor renal function, I will reduce spironolactone to 12.5 mg daily and torsemide to 20 mg bid. Will repeat BMET in 2 weeks. I've asked one of our nurses to give her a call in 1-2 weeks to see how she is feeling with these new med changes. Will have to monitor for BP elevation as well. 2. CAD: She underwent a low risk, normal nuclear stress test in September 2014. Continue current therapy with aspirin, Plavix,  Zetia, Imdur, Bystolic, and Crestor. In an attempt to reduce meds as per her request, I will d/c Ranexa. I informed her that should she experience a recurrence of chest pain, I would likely restart this. 3. Hypertension: Reasonably controlled on current therapy. Will need to monitor given reduction of Aldactone dose. 4. Hyperlipidemia: Continue current therapy with Crestor and Zetia. 5. CKD: Med changes as above. Repeat BMET in 2 weeks.  Dispo: f/u 3 months.  Kate Sable, M.D., F.A.C.C.

## 2014-04-07 NOTE — Patient Instructions (Addendum)
Your physician recommends that you schedule a follow-up appointment in: 3 months   Your physician has recommended you make the following change in your medication:     STOP  Ranexa     PLEASE call us for any chest pain or leg swelling after stopping this medication    DECEASE Torsemide to 20 mg twice a day   PLEASE TAKE SPIRONOLACTONE (Aldactone) 12. 5 mg a day (cut the 25 mg tablet in half)  Please get blood work in 2 weeks (BMET)  June 3 rd    Thank you for choosing [facility] !

## 2014-04-08 DIAGNOSIS — IMO0001 Reserved for inherently not codable concepts without codable children: Secondary | ICD-10-CM | POA: Diagnosis not present

## 2014-04-08 DIAGNOSIS — M545 Low back pain, unspecified: Secondary | ICD-10-CM | POA: Diagnosis not present

## 2014-04-08 DIAGNOSIS — I2510 Atherosclerotic heart disease of native coronary artery without angina pectoris: Secondary | ICD-10-CM | POA: Diagnosis not present

## 2014-04-08 DIAGNOSIS — I129 Hypertensive chronic kidney disease with stage 1 through stage 4 chronic kidney disease, or unspecified chronic kidney disease: Secondary | ICD-10-CM | POA: Diagnosis not present

## 2014-04-08 DIAGNOSIS — E119 Type 2 diabetes mellitus without complications: Secondary | ICD-10-CM | POA: Diagnosis not present

## 2014-04-08 DIAGNOSIS — M25559 Pain in unspecified hip: Secondary | ICD-10-CM | POA: Diagnosis not present

## 2014-04-09 DIAGNOSIS — I129 Hypertensive chronic kidney disease with stage 1 through stage 4 chronic kidney disease, or unspecified chronic kidney disease: Secondary | ICD-10-CM | POA: Diagnosis not present

## 2014-04-09 DIAGNOSIS — M25559 Pain in unspecified hip: Secondary | ICD-10-CM | POA: Diagnosis not present

## 2014-04-09 DIAGNOSIS — E119 Type 2 diabetes mellitus without complications: Secondary | ICD-10-CM | POA: Diagnosis not present

## 2014-04-09 DIAGNOSIS — M545 Low back pain, unspecified: Secondary | ICD-10-CM | POA: Diagnosis not present

## 2014-04-09 DIAGNOSIS — I2510 Atherosclerotic heart disease of native coronary artery without angina pectoris: Secondary | ICD-10-CM | POA: Diagnosis not present

## 2014-04-09 DIAGNOSIS — IMO0001 Reserved for inherently not codable concepts without codable children: Secondary | ICD-10-CM | POA: Diagnosis not present

## 2014-04-13 DIAGNOSIS — E119 Type 2 diabetes mellitus without complications: Secondary | ICD-10-CM | POA: Diagnosis not present

## 2014-04-13 DIAGNOSIS — M545 Low back pain, unspecified: Secondary | ICD-10-CM | POA: Diagnosis not present

## 2014-04-13 DIAGNOSIS — IMO0001 Reserved for inherently not codable concepts without codable children: Secondary | ICD-10-CM | POA: Diagnosis not present

## 2014-04-13 DIAGNOSIS — I129 Hypertensive chronic kidney disease with stage 1 through stage 4 chronic kidney disease, or unspecified chronic kidney disease: Secondary | ICD-10-CM | POA: Diagnosis not present

## 2014-04-13 DIAGNOSIS — M25559 Pain in unspecified hip: Secondary | ICD-10-CM | POA: Diagnosis not present

## 2014-04-13 DIAGNOSIS — I2510 Atherosclerotic heart disease of native coronary artery without angina pectoris: Secondary | ICD-10-CM | POA: Diagnosis not present

## 2014-04-14 DIAGNOSIS — E119 Type 2 diabetes mellitus without complications: Secondary | ICD-10-CM | POA: Diagnosis not present

## 2014-04-14 DIAGNOSIS — I129 Hypertensive chronic kidney disease with stage 1 through stage 4 chronic kidney disease, or unspecified chronic kidney disease: Secondary | ICD-10-CM | POA: Diagnosis not present

## 2014-04-14 DIAGNOSIS — I2510 Atherosclerotic heart disease of native coronary artery without angina pectoris: Secondary | ICD-10-CM | POA: Diagnosis not present

## 2014-04-14 DIAGNOSIS — M25559 Pain in unspecified hip: Secondary | ICD-10-CM | POA: Diagnosis not present

## 2014-04-14 DIAGNOSIS — M545 Low back pain, unspecified: Secondary | ICD-10-CM | POA: Diagnosis not present

## 2014-04-14 DIAGNOSIS — IMO0001 Reserved for inherently not codable concepts without codable children: Secondary | ICD-10-CM | POA: Diagnosis not present

## 2014-04-15 DIAGNOSIS — E119 Type 2 diabetes mellitus without complications: Secondary | ICD-10-CM | POA: Diagnosis not present

## 2014-04-15 DIAGNOSIS — IMO0001 Reserved for inherently not codable concepts without codable children: Secondary | ICD-10-CM | POA: Diagnosis not present

## 2014-04-15 DIAGNOSIS — I2510 Atherosclerotic heart disease of native coronary artery without angina pectoris: Secondary | ICD-10-CM | POA: Diagnosis not present

## 2014-04-15 DIAGNOSIS — M545 Low back pain, unspecified: Secondary | ICD-10-CM | POA: Diagnosis not present

## 2014-04-15 DIAGNOSIS — M25559 Pain in unspecified hip: Secondary | ICD-10-CM | POA: Diagnosis not present

## 2014-04-15 DIAGNOSIS — I129 Hypertensive chronic kidney disease with stage 1 through stage 4 chronic kidney disease, or unspecified chronic kidney disease: Secondary | ICD-10-CM | POA: Diagnosis not present

## 2014-04-19 DIAGNOSIS — I129 Hypertensive chronic kidney disease with stage 1 through stage 4 chronic kidney disease, or unspecified chronic kidney disease: Secondary | ICD-10-CM | POA: Diagnosis not present

## 2014-04-19 DIAGNOSIS — I2510 Atherosclerotic heart disease of native coronary artery without angina pectoris: Secondary | ICD-10-CM | POA: Diagnosis not present

## 2014-04-19 DIAGNOSIS — M25559 Pain in unspecified hip: Secondary | ICD-10-CM | POA: Diagnosis not present

## 2014-04-19 DIAGNOSIS — M545 Low back pain, unspecified: Secondary | ICD-10-CM | POA: Diagnosis not present

## 2014-04-19 DIAGNOSIS — IMO0001 Reserved for inherently not codable concepts without codable children: Secondary | ICD-10-CM | POA: Diagnosis not present

## 2014-04-19 DIAGNOSIS — E119 Type 2 diabetes mellitus without complications: Secondary | ICD-10-CM | POA: Diagnosis not present

## 2014-04-20 DIAGNOSIS — M545 Low back pain, unspecified: Secondary | ICD-10-CM | POA: Diagnosis not present

## 2014-04-20 DIAGNOSIS — IMO0001 Reserved for inherently not codable concepts without codable children: Secondary | ICD-10-CM | POA: Diagnosis not present

## 2014-04-20 DIAGNOSIS — I2510 Atherosclerotic heart disease of native coronary artery without angina pectoris: Secondary | ICD-10-CM | POA: Diagnosis not present

## 2014-04-20 DIAGNOSIS — E119 Type 2 diabetes mellitus without complications: Secondary | ICD-10-CM | POA: Diagnosis not present

## 2014-04-20 DIAGNOSIS — I129 Hypertensive chronic kidney disease with stage 1 through stage 4 chronic kidney disease, or unspecified chronic kidney disease: Secondary | ICD-10-CM | POA: Diagnosis not present

## 2014-04-20 DIAGNOSIS — M25559 Pain in unspecified hip: Secondary | ICD-10-CM | POA: Diagnosis not present

## 2014-04-22 DIAGNOSIS — IMO0001 Reserved for inherently not codable concepts without codable children: Secondary | ICD-10-CM | POA: Diagnosis not present

## 2014-04-22 DIAGNOSIS — M545 Low back pain, unspecified: Secondary | ICD-10-CM | POA: Diagnosis not present

## 2014-04-22 DIAGNOSIS — M25559 Pain in unspecified hip: Secondary | ICD-10-CM | POA: Diagnosis not present

## 2014-04-22 DIAGNOSIS — I129 Hypertensive chronic kidney disease with stage 1 through stage 4 chronic kidney disease, or unspecified chronic kidney disease: Secondary | ICD-10-CM | POA: Diagnosis not present

## 2014-04-22 DIAGNOSIS — I2510 Atherosclerotic heart disease of native coronary artery without angina pectoris: Secondary | ICD-10-CM | POA: Diagnosis not present

## 2014-04-22 DIAGNOSIS — E119 Type 2 diabetes mellitus without complications: Secondary | ICD-10-CM | POA: Diagnosis not present

## 2014-04-25 DIAGNOSIS — N189 Chronic kidney disease, unspecified: Secondary | ICD-10-CM | POA: Diagnosis not present

## 2014-04-25 DIAGNOSIS — I2510 Atherosclerotic heart disease of native coronary artery without angina pectoris: Secondary | ICD-10-CM | POA: Diagnosis not present

## 2014-04-25 DIAGNOSIS — Z5189 Encounter for other specified aftercare: Secondary | ICD-10-CM | POA: Diagnosis not present

## 2014-04-25 DIAGNOSIS — Z9181 History of falling: Secondary | ICD-10-CM | POA: Diagnosis not present

## 2014-04-25 DIAGNOSIS — E119 Type 2 diabetes mellitus without complications: Secondary | ICD-10-CM | POA: Diagnosis not present

## 2014-04-25 DIAGNOSIS — Z9981 Dependence on supplemental oxygen: Secondary | ICD-10-CM | POA: Diagnosis not present

## 2014-04-25 DIAGNOSIS — I129 Hypertensive chronic kidney disease with stage 1 through stage 4 chronic kidney disease, or unspecified chronic kidney disease: Secondary | ICD-10-CM | POA: Diagnosis not present

## 2014-04-25 DIAGNOSIS — M6281 Muscle weakness (generalized): Secondary | ICD-10-CM | POA: Diagnosis not present

## 2014-04-25 DIAGNOSIS — J449 Chronic obstructive pulmonary disease, unspecified: Secondary | ICD-10-CM | POA: Diagnosis not present

## 2014-04-25 DIAGNOSIS — Z794 Long term (current) use of insulin: Secondary | ICD-10-CM | POA: Diagnosis not present

## 2014-04-25 DIAGNOSIS — I509 Heart failure, unspecified: Secondary | ICD-10-CM | POA: Diagnosis not present

## 2014-04-26 DIAGNOSIS — M6281 Muscle weakness (generalized): Secondary | ICD-10-CM | POA: Diagnosis not present

## 2014-04-26 DIAGNOSIS — N189 Chronic kidney disease, unspecified: Secondary | ICD-10-CM | POA: Diagnosis not present

## 2014-04-26 DIAGNOSIS — E119 Type 2 diabetes mellitus without complications: Secondary | ICD-10-CM | POA: Diagnosis not present

## 2014-04-26 DIAGNOSIS — I509 Heart failure, unspecified: Secondary | ICD-10-CM | POA: Diagnosis not present

## 2014-04-26 DIAGNOSIS — I129 Hypertensive chronic kidney disease with stage 1 through stage 4 chronic kidney disease, or unspecified chronic kidney disease: Secondary | ICD-10-CM | POA: Diagnosis not present

## 2014-04-26 DIAGNOSIS — Z5189 Encounter for other specified aftercare: Secondary | ICD-10-CM | POA: Diagnosis not present

## 2014-04-28 ENCOUNTER — Other Ambulatory Visit: Payer: Self-pay | Admitting: Endocrinology

## 2014-04-28 DIAGNOSIS — M6281 Muscle weakness (generalized): Secondary | ICD-10-CM | POA: Diagnosis not present

## 2014-04-28 DIAGNOSIS — I129 Hypertensive chronic kidney disease with stage 1 through stage 4 chronic kidney disease, or unspecified chronic kidney disease: Secondary | ICD-10-CM | POA: Diagnosis not present

## 2014-04-28 DIAGNOSIS — Z5189 Encounter for other specified aftercare: Secondary | ICD-10-CM | POA: Diagnosis not present

## 2014-04-28 DIAGNOSIS — N189 Chronic kidney disease, unspecified: Secondary | ICD-10-CM | POA: Diagnosis not present

## 2014-04-28 DIAGNOSIS — E119 Type 2 diabetes mellitus without complications: Secondary | ICD-10-CM | POA: Diagnosis not present

## 2014-04-28 DIAGNOSIS — I509 Heart failure, unspecified: Secondary | ICD-10-CM | POA: Diagnosis not present

## 2014-04-29 DIAGNOSIS — N189 Chronic kidney disease, unspecified: Secondary | ICD-10-CM | POA: Diagnosis not present

## 2014-04-29 DIAGNOSIS — I509 Heart failure, unspecified: Secondary | ICD-10-CM | POA: Diagnosis not present

## 2014-04-29 DIAGNOSIS — E119 Type 2 diabetes mellitus without complications: Secondary | ICD-10-CM | POA: Diagnosis not present

## 2014-04-29 DIAGNOSIS — M6281 Muscle weakness (generalized): Secondary | ICD-10-CM | POA: Diagnosis not present

## 2014-04-29 DIAGNOSIS — Z5189 Encounter for other specified aftercare: Secondary | ICD-10-CM | POA: Diagnosis not present

## 2014-04-29 DIAGNOSIS — I129 Hypertensive chronic kidney disease with stage 1 through stage 4 chronic kidney disease, or unspecified chronic kidney disease: Secondary | ICD-10-CM | POA: Diagnosis not present

## 2014-05-03 DIAGNOSIS — I509 Heart failure, unspecified: Secondary | ICD-10-CM | POA: Diagnosis not present

## 2014-05-03 DIAGNOSIS — Z5189 Encounter for other specified aftercare: Secondary | ICD-10-CM | POA: Diagnosis not present

## 2014-05-03 DIAGNOSIS — I129 Hypertensive chronic kidney disease with stage 1 through stage 4 chronic kidney disease, or unspecified chronic kidney disease: Secondary | ICD-10-CM | POA: Diagnosis not present

## 2014-05-03 DIAGNOSIS — E119 Type 2 diabetes mellitus without complications: Secondary | ICD-10-CM | POA: Diagnosis not present

## 2014-05-03 DIAGNOSIS — M6281 Muscle weakness (generalized): Secondary | ICD-10-CM | POA: Diagnosis not present

## 2014-05-03 DIAGNOSIS — N189 Chronic kidney disease, unspecified: Secondary | ICD-10-CM | POA: Diagnosis not present

## 2014-05-04 DIAGNOSIS — N189 Chronic kidney disease, unspecified: Secondary | ICD-10-CM | POA: Diagnosis not present

## 2014-05-04 DIAGNOSIS — Z5189 Encounter for other specified aftercare: Secondary | ICD-10-CM | POA: Diagnosis not present

## 2014-05-04 DIAGNOSIS — I509 Heart failure, unspecified: Secondary | ICD-10-CM | POA: Diagnosis not present

## 2014-05-04 DIAGNOSIS — M6281 Muscle weakness (generalized): Secondary | ICD-10-CM | POA: Diagnosis not present

## 2014-05-04 DIAGNOSIS — I129 Hypertensive chronic kidney disease with stage 1 through stage 4 chronic kidney disease, or unspecified chronic kidney disease: Secondary | ICD-10-CM | POA: Diagnosis not present

## 2014-05-04 DIAGNOSIS — E119 Type 2 diabetes mellitus without complications: Secondary | ICD-10-CM | POA: Diagnosis not present

## 2014-05-05 DIAGNOSIS — Z5189 Encounter for other specified aftercare: Secondary | ICD-10-CM | POA: Diagnosis not present

## 2014-05-05 DIAGNOSIS — I509 Heart failure, unspecified: Secondary | ICD-10-CM | POA: Diagnosis not present

## 2014-05-05 DIAGNOSIS — E119 Type 2 diabetes mellitus without complications: Secondary | ICD-10-CM | POA: Diagnosis not present

## 2014-05-05 DIAGNOSIS — M6281 Muscle weakness (generalized): Secondary | ICD-10-CM | POA: Diagnosis not present

## 2014-05-05 DIAGNOSIS — I129 Hypertensive chronic kidney disease with stage 1 through stage 4 chronic kidney disease, or unspecified chronic kidney disease: Secondary | ICD-10-CM | POA: Diagnosis not present

## 2014-05-05 DIAGNOSIS — N189 Chronic kidney disease, unspecified: Secondary | ICD-10-CM | POA: Diagnosis not present

## 2014-05-06 DIAGNOSIS — I509 Heart failure, unspecified: Secondary | ICD-10-CM | POA: Diagnosis not present

## 2014-05-06 DIAGNOSIS — Z5189 Encounter for other specified aftercare: Secondary | ICD-10-CM | POA: Diagnosis not present

## 2014-05-06 DIAGNOSIS — M6281 Muscle weakness (generalized): Secondary | ICD-10-CM | POA: Diagnosis not present

## 2014-05-06 DIAGNOSIS — I129 Hypertensive chronic kidney disease with stage 1 through stage 4 chronic kidney disease, or unspecified chronic kidney disease: Secondary | ICD-10-CM | POA: Diagnosis not present

## 2014-05-06 DIAGNOSIS — N189 Chronic kidney disease, unspecified: Secondary | ICD-10-CM | POA: Diagnosis not present

## 2014-05-06 DIAGNOSIS — E119 Type 2 diabetes mellitus without complications: Secondary | ICD-10-CM | POA: Diagnosis not present

## 2014-05-10 DIAGNOSIS — N189 Chronic kidney disease, unspecified: Secondary | ICD-10-CM | POA: Diagnosis not present

## 2014-05-10 DIAGNOSIS — I509 Heart failure, unspecified: Secondary | ICD-10-CM | POA: Diagnosis not present

## 2014-05-10 DIAGNOSIS — E119 Type 2 diabetes mellitus without complications: Secondary | ICD-10-CM | POA: Diagnosis not present

## 2014-05-10 DIAGNOSIS — I129 Hypertensive chronic kidney disease with stage 1 through stage 4 chronic kidney disease, or unspecified chronic kidney disease: Secondary | ICD-10-CM | POA: Diagnosis not present

## 2014-05-10 DIAGNOSIS — Z5189 Encounter for other specified aftercare: Secondary | ICD-10-CM | POA: Diagnosis not present

## 2014-05-10 DIAGNOSIS — M6281 Muscle weakness (generalized): Secondary | ICD-10-CM | POA: Diagnosis not present

## 2014-05-11 DIAGNOSIS — I509 Heart failure, unspecified: Secondary | ICD-10-CM | POA: Diagnosis not present

## 2014-05-11 DIAGNOSIS — N189 Chronic kidney disease, unspecified: Secondary | ICD-10-CM | POA: Diagnosis not present

## 2014-05-11 DIAGNOSIS — I129 Hypertensive chronic kidney disease with stage 1 through stage 4 chronic kidney disease, or unspecified chronic kidney disease: Secondary | ICD-10-CM | POA: Diagnosis not present

## 2014-05-11 DIAGNOSIS — E119 Type 2 diabetes mellitus without complications: Secondary | ICD-10-CM | POA: Diagnosis not present

## 2014-05-11 DIAGNOSIS — Z5189 Encounter for other specified aftercare: Secondary | ICD-10-CM | POA: Diagnosis not present

## 2014-05-11 DIAGNOSIS — M6281 Muscle weakness (generalized): Secondary | ICD-10-CM | POA: Diagnosis not present

## 2014-05-12 DIAGNOSIS — J449 Chronic obstructive pulmonary disease, unspecified: Secondary | ICD-10-CM | POA: Diagnosis not present

## 2014-05-12 DIAGNOSIS — I10 Essential (primary) hypertension: Secondary | ICD-10-CM | POA: Diagnosis not present

## 2014-05-12 DIAGNOSIS — N181 Chronic kidney disease, stage 1: Secondary | ICD-10-CM | POA: Diagnosis not present

## 2014-05-12 DIAGNOSIS — E109 Type 1 diabetes mellitus without complications: Secondary | ICD-10-CM | POA: Diagnosis not present

## 2014-05-12 DIAGNOSIS — E119 Type 2 diabetes mellitus without complications: Secondary | ICD-10-CM | POA: Diagnosis not present

## 2014-05-12 DIAGNOSIS — G4730 Sleep apnea, unspecified: Secondary | ICD-10-CM | POA: Diagnosis not present

## 2014-05-12 DIAGNOSIS — M2669 Other specified disorders of temporomandibular joint: Secondary | ICD-10-CM | POA: Diagnosis not present

## 2014-05-13 DIAGNOSIS — I519 Heart disease, unspecified: Secondary | ICD-10-CM | POA: Diagnosis not present

## 2014-05-13 LAB — BASIC METABOLIC PANEL
BUN: 33 mg/dL — ABNORMAL HIGH (ref 6–23)
CALCIUM: 9.9 mg/dL (ref 8.4–10.5)
CHLORIDE: 99 meq/L (ref 96–112)
CO2: 29 meq/L (ref 19–32)
CREATININE: 1.52 mg/dL — AB (ref 0.50–1.10)
Glucose, Bld: 185 mg/dL — ABNORMAL HIGH (ref 70–99)
Potassium: 3.8 mEq/L (ref 3.5–5.3)
SODIUM: 141 meq/L (ref 135–145)

## 2014-05-18 DIAGNOSIS — I509 Heart failure, unspecified: Secondary | ICD-10-CM | POA: Diagnosis not present

## 2014-05-18 DIAGNOSIS — I129 Hypertensive chronic kidney disease with stage 1 through stage 4 chronic kidney disease, or unspecified chronic kidney disease: Secondary | ICD-10-CM | POA: Diagnosis not present

## 2014-05-18 DIAGNOSIS — E119 Type 2 diabetes mellitus without complications: Secondary | ICD-10-CM | POA: Diagnosis not present

## 2014-05-18 DIAGNOSIS — N189 Chronic kidney disease, unspecified: Secondary | ICD-10-CM | POA: Diagnosis not present

## 2014-05-18 DIAGNOSIS — Z5189 Encounter for other specified aftercare: Secondary | ICD-10-CM | POA: Diagnosis not present

## 2014-05-18 DIAGNOSIS — M6281 Muscle weakness (generalized): Secondary | ICD-10-CM | POA: Diagnosis not present

## 2014-05-24 DIAGNOSIS — E559 Vitamin D deficiency, unspecified: Secondary | ICD-10-CM | POA: Diagnosis not present

## 2014-05-24 DIAGNOSIS — I129 Hypertensive chronic kidney disease with stage 1 through stage 4 chronic kidney disease, or unspecified chronic kidney disease: Secondary | ICD-10-CM | POA: Diagnosis not present

## 2014-05-24 DIAGNOSIS — E119 Type 2 diabetes mellitus without complications: Secondary | ICD-10-CM | POA: Diagnosis not present

## 2014-05-24 DIAGNOSIS — Z79899 Other long term (current) drug therapy: Secondary | ICD-10-CM | POA: Diagnosis not present

## 2014-05-24 DIAGNOSIS — Z5189 Encounter for other specified aftercare: Secondary | ICD-10-CM | POA: Diagnosis not present

## 2014-05-24 DIAGNOSIS — I509 Heart failure, unspecified: Secondary | ICD-10-CM | POA: Diagnosis not present

## 2014-05-24 DIAGNOSIS — N189 Chronic kidney disease, unspecified: Secondary | ICD-10-CM | POA: Diagnosis not present

## 2014-05-24 DIAGNOSIS — R809 Proteinuria, unspecified: Secondary | ICD-10-CM | POA: Diagnosis not present

## 2014-05-24 DIAGNOSIS — M6281 Muscle weakness (generalized): Secondary | ICD-10-CM | POA: Diagnosis not present

## 2014-05-24 DIAGNOSIS — I10 Essential (primary) hypertension: Secondary | ICD-10-CM | POA: Diagnosis not present

## 2014-05-24 DIAGNOSIS — D649 Anemia, unspecified: Secondary | ICD-10-CM | POA: Diagnosis not present

## 2014-05-26 DIAGNOSIS — M6281 Muscle weakness (generalized): Secondary | ICD-10-CM | POA: Diagnosis not present

## 2014-05-26 DIAGNOSIS — I509 Heart failure, unspecified: Secondary | ICD-10-CM | POA: Diagnosis not present

## 2014-05-26 DIAGNOSIS — R809 Proteinuria, unspecified: Secondary | ICD-10-CM | POA: Diagnosis not present

## 2014-05-26 DIAGNOSIS — N189 Chronic kidney disease, unspecified: Secondary | ICD-10-CM | POA: Diagnosis not present

## 2014-05-26 DIAGNOSIS — I129 Hypertensive chronic kidney disease with stage 1 through stage 4 chronic kidney disease, or unspecified chronic kidney disease: Secondary | ICD-10-CM | POA: Diagnosis not present

## 2014-05-26 DIAGNOSIS — E559 Vitamin D deficiency, unspecified: Secondary | ICD-10-CM | POA: Diagnosis not present

## 2014-05-26 DIAGNOSIS — N183 Chronic kidney disease, stage 3 unspecified: Secondary | ICD-10-CM | POA: Diagnosis not present

## 2014-05-26 DIAGNOSIS — E119 Type 2 diabetes mellitus without complications: Secondary | ICD-10-CM | POA: Diagnosis not present

## 2014-05-26 DIAGNOSIS — Z5189 Encounter for other specified aftercare: Secondary | ICD-10-CM | POA: Diagnosis not present

## 2014-05-26 DIAGNOSIS — I10 Essential (primary) hypertension: Secondary | ICD-10-CM | POA: Diagnosis not present

## 2014-06-01 DIAGNOSIS — I509 Heart failure, unspecified: Secondary | ICD-10-CM | POA: Diagnosis not present

## 2014-06-01 DIAGNOSIS — Z5189 Encounter for other specified aftercare: Secondary | ICD-10-CM | POA: Diagnosis not present

## 2014-06-01 DIAGNOSIS — I129 Hypertensive chronic kidney disease with stage 1 through stage 4 chronic kidney disease, or unspecified chronic kidney disease: Secondary | ICD-10-CM | POA: Diagnosis not present

## 2014-06-01 DIAGNOSIS — E119 Type 2 diabetes mellitus without complications: Secondary | ICD-10-CM | POA: Diagnosis not present

## 2014-06-01 DIAGNOSIS — N189 Chronic kidney disease, unspecified: Secondary | ICD-10-CM | POA: Diagnosis not present

## 2014-06-01 DIAGNOSIS — M6281 Muscle weakness (generalized): Secondary | ICD-10-CM | POA: Diagnosis not present

## 2014-06-03 DIAGNOSIS — N189 Chronic kidney disease, unspecified: Secondary | ICD-10-CM | POA: Diagnosis not present

## 2014-06-03 DIAGNOSIS — I509 Heart failure, unspecified: Secondary | ICD-10-CM | POA: Diagnosis not present

## 2014-06-03 DIAGNOSIS — Z5189 Encounter for other specified aftercare: Secondary | ICD-10-CM | POA: Diagnosis not present

## 2014-06-03 DIAGNOSIS — M6281 Muscle weakness (generalized): Secondary | ICD-10-CM | POA: Diagnosis not present

## 2014-06-03 DIAGNOSIS — E119 Type 2 diabetes mellitus without complications: Secondary | ICD-10-CM | POA: Diagnosis not present

## 2014-06-03 DIAGNOSIS — I129 Hypertensive chronic kidney disease with stage 1 through stage 4 chronic kidney disease, or unspecified chronic kidney disease: Secondary | ICD-10-CM | POA: Diagnosis not present

## 2014-06-07 DIAGNOSIS — I509 Heart failure, unspecified: Secondary | ICD-10-CM | POA: Diagnosis not present

## 2014-06-07 DIAGNOSIS — M6281 Muscle weakness (generalized): Secondary | ICD-10-CM | POA: Diagnosis not present

## 2014-06-07 DIAGNOSIS — N189 Chronic kidney disease, unspecified: Secondary | ICD-10-CM | POA: Diagnosis not present

## 2014-06-07 DIAGNOSIS — Z5189 Encounter for other specified aftercare: Secondary | ICD-10-CM | POA: Diagnosis not present

## 2014-06-07 DIAGNOSIS — I129 Hypertensive chronic kidney disease with stage 1 through stage 4 chronic kidney disease, or unspecified chronic kidney disease: Secondary | ICD-10-CM | POA: Diagnosis not present

## 2014-06-07 DIAGNOSIS — E119 Type 2 diabetes mellitus without complications: Secondary | ICD-10-CM | POA: Diagnosis not present

## 2014-06-08 DIAGNOSIS — Z5189 Encounter for other specified aftercare: Secondary | ICD-10-CM | POA: Diagnosis not present

## 2014-06-08 DIAGNOSIS — M6281 Muscle weakness (generalized): Secondary | ICD-10-CM | POA: Diagnosis not present

## 2014-06-08 DIAGNOSIS — E119 Type 2 diabetes mellitus without complications: Secondary | ICD-10-CM | POA: Diagnosis not present

## 2014-06-08 DIAGNOSIS — N189 Chronic kidney disease, unspecified: Secondary | ICD-10-CM | POA: Diagnosis not present

## 2014-06-08 DIAGNOSIS — I129 Hypertensive chronic kidney disease with stage 1 through stage 4 chronic kidney disease, or unspecified chronic kidney disease: Secondary | ICD-10-CM | POA: Diagnosis not present

## 2014-06-08 DIAGNOSIS — I509 Heart failure, unspecified: Secondary | ICD-10-CM | POA: Diagnosis not present

## 2014-06-09 DIAGNOSIS — M6281 Muscle weakness (generalized): Secondary | ICD-10-CM | POA: Diagnosis not present

## 2014-06-09 DIAGNOSIS — E119 Type 2 diabetes mellitus without complications: Secondary | ICD-10-CM | POA: Diagnosis not present

## 2014-06-09 DIAGNOSIS — I509 Heart failure, unspecified: Secondary | ICD-10-CM | POA: Diagnosis not present

## 2014-06-09 DIAGNOSIS — I129 Hypertensive chronic kidney disease with stage 1 through stage 4 chronic kidney disease, or unspecified chronic kidney disease: Secondary | ICD-10-CM | POA: Diagnosis not present

## 2014-06-09 DIAGNOSIS — N189 Chronic kidney disease, unspecified: Secondary | ICD-10-CM | POA: Diagnosis not present

## 2014-06-09 DIAGNOSIS — Z5189 Encounter for other specified aftercare: Secondary | ICD-10-CM | POA: Diagnosis not present

## 2014-06-10 DIAGNOSIS — E119 Type 2 diabetes mellitus without complications: Secondary | ICD-10-CM | POA: Diagnosis not present

## 2014-06-10 DIAGNOSIS — I509 Heart failure, unspecified: Secondary | ICD-10-CM | POA: Diagnosis not present

## 2014-06-10 DIAGNOSIS — Z5189 Encounter for other specified aftercare: Secondary | ICD-10-CM | POA: Diagnosis not present

## 2014-06-10 DIAGNOSIS — M6281 Muscle weakness (generalized): Secondary | ICD-10-CM | POA: Diagnosis not present

## 2014-06-10 DIAGNOSIS — I129 Hypertensive chronic kidney disease with stage 1 through stage 4 chronic kidney disease, or unspecified chronic kidney disease: Secondary | ICD-10-CM | POA: Diagnosis not present

## 2014-06-10 DIAGNOSIS — N189 Chronic kidney disease, unspecified: Secondary | ICD-10-CM | POA: Diagnosis not present

## 2014-06-15 DIAGNOSIS — Z5189 Encounter for other specified aftercare: Secondary | ICD-10-CM | POA: Diagnosis not present

## 2014-06-15 DIAGNOSIS — N189 Chronic kidney disease, unspecified: Secondary | ICD-10-CM | POA: Diagnosis not present

## 2014-06-15 DIAGNOSIS — M6281 Muscle weakness (generalized): Secondary | ICD-10-CM | POA: Diagnosis not present

## 2014-06-15 DIAGNOSIS — I129 Hypertensive chronic kidney disease with stage 1 through stage 4 chronic kidney disease, or unspecified chronic kidney disease: Secondary | ICD-10-CM | POA: Diagnosis not present

## 2014-06-15 DIAGNOSIS — E119 Type 2 diabetes mellitus without complications: Secondary | ICD-10-CM | POA: Diagnosis not present

## 2014-06-15 DIAGNOSIS — I509 Heart failure, unspecified: Secondary | ICD-10-CM | POA: Diagnosis not present

## 2014-06-16 DIAGNOSIS — M6281 Muscle weakness (generalized): Secondary | ICD-10-CM | POA: Diagnosis not present

## 2014-06-16 DIAGNOSIS — N189 Chronic kidney disease, unspecified: Secondary | ICD-10-CM | POA: Diagnosis not present

## 2014-06-16 DIAGNOSIS — Z5189 Encounter for other specified aftercare: Secondary | ICD-10-CM | POA: Diagnosis not present

## 2014-06-16 DIAGNOSIS — I509 Heart failure, unspecified: Secondary | ICD-10-CM | POA: Diagnosis not present

## 2014-06-16 DIAGNOSIS — I129 Hypertensive chronic kidney disease with stage 1 through stage 4 chronic kidney disease, or unspecified chronic kidney disease: Secondary | ICD-10-CM | POA: Diagnosis not present

## 2014-06-16 DIAGNOSIS — E119 Type 2 diabetes mellitus without complications: Secondary | ICD-10-CM | POA: Diagnosis not present

## 2014-06-18 DIAGNOSIS — L608 Other nail disorders: Secondary | ICD-10-CM | POA: Diagnosis not present

## 2014-06-18 DIAGNOSIS — I739 Peripheral vascular disease, unspecified: Secondary | ICD-10-CM | POA: Diagnosis not present

## 2014-06-18 DIAGNOSIS — M6281 Muscle weakness (generalized): Secondary | ICD-10-CM | POA: Diagnosis not present

## 2014-06-18 DIAGNOSIS — L84 Corns and callosities: Secondary | ICD-10-CM | POA: Diagnosis not present

## 2014-06-18 DIAGNOSIS — N189 Chronic kidney disease, unspecified: Secondary | ICD-10-CM | POA: Diagnosis not present

## 2014-06-18 DIAGNOSIS — E1059 Type 1 diabetes mellitus with other circulatory complications: Secondary | ICD-10-CM | POA: Diagnosis not present

## 2014-06-18 DIAGNOSIS — Z5189 Encounter for other specified aftercare: Secondary | ICD-10-CM | POA: Diagnosis not present

## 2014-06-18 DIAGNOSIS — I129 Hypertensive chronic kidney disease with stage 1 through stage 4 chronic kidney disease, or unspecified chronic kidney disease: Secondary | ICD-10-CM | POA: Diagnosis not present

## 2014-06-18 DIAGNOSIS — I509 Heart failure, unspecified: Secondary | ICD-10-CM | POA: Diagnosis not present

## 2014-06-18 DIAGNOSIS — E119 Type 2 diabetes mellitus without complications: Secondary | ICD-10-CM | POA: Diagnosis not present

## 2014-07-07 DIAGNOSIS — I10 Essential (primary) hypertension: Secondary | ICD-10-CM | POA: Diagnosis not present

## 2014-07-07 DIAGNOSIS — E119 Type 2 diabetes mellitus without complications: Secondary | ICD-10-CM | POA: Diagnosis not present

## 2014-07-07 DIAGNOSIS — I209 Angina pectoris, unspecified: Secondary | ICD-10-CM | POA: Diagnosis not present

## 2014-07-13 DIAGNOSIS — J449 Chronic obstructive pulmonary disease, unspecified: Secondary | ICD-10-CM | POA: Diagnosis not present

## 2014-07-13 DIAGNOSIS — G4730 Sleep apnea, unspecified: Secondary | ICD-10-CM | POA: Diagnosis not present

## 2014-07-13 DIAGNOSIS — J019 Acute sinusitis, unspecified: Secondary | ICD-10-CM | POA: Diagnosis not present

## 2014-07-13 DIAGNOSIS — E109 Type 1 diabetes mellitus without complications: Secondary | ICD-10-CM | POA: Diagnosis not present

## 2014-07-19 ENCOUNTER — Ambulatory Visit (INDEPENDENT_AMBULATORY_CARE_PROVIDER_SITE_OTHER): Payer: Medicare Other | Admitting: Cardiovascular Disease

## 2014-07-19 ENCOUNTER — Encounter: Payer: Self-pay | Admitting: Cardiovascular Disease

## 2014-07-19 VITALS — BP 142/98 | HR 66 | Ht 67.0 in | Wt 215.0 lb

## 2014-07-19 DIAGNOSIS — I5032 Chronic diastolic (congestive) heart failure: Secondary | ICD-10-CM | POA: Diagnosis not present

## 2014-07-19 DIAGNOSIS — I25810 Atherosclerosis of coronary artery bypass graft(s) without angina pectoris: Secondary | ICD-10-CM

## 2014-07-19 DIAGNOSIS — I209 Angina pectoris, unspecified: Secondary | ICD-10-CM

## 2014-07-19 DIAGNOSIS — E785 Hyperlipidemia, unspecified: Secondary | ICD-10-CM

## 2014-07-19 DIAGNOSIS — Z79899 Other long term (current) drug therapy: Secondary | ICD-10-CM

## 2014-07-19 DIAGNOSIS — R079 Chest pain, unspecified: Secondary | ICD-10-CM

## 2014-07-19 DIAGNOSIS — I2510 Atherosclerotic heart disease of native coronary artery without angina pectoris: Secondary | ICD-10-CM

## 2014-07-19 DIAGNOSIS — G4730 Sleep apnea, unspecified: Secondary | ICD-10-CM

## 2014-07-19 DIAGNOSIS — I25709 Atherosclerosis of coronary artery bypass graft(s), unspecified, with unspecified angina pectoris: Secondary | ICD-10-CM

## 2014-07-19 DIAGNOSIS — E669 Obesity, unspecified: Secondary | ICD-10-CM

## 2014-07-19 DIAGNOSIS — I10 Essential (primary) hypertension: Secondary | ICD-10-CM | POA: Diagnosis not present

## 2014-07-19 DIAGNOSIS — J449 Chronic obstructive pulmonary disease, unspecified: Secondary | ICD-10-CM

## 2014-07-19 DIAGNOSIS — I2789 Other specified pulmonary heart diseases: Secondary | ICD-10-CM

## 2014-07-19 DIAGNOSIS — R0602 Shortness of breath: Secondary | ICD-10-CM

## 2014-07-19 MED ORDER — ISOSORBIDE MONONITRATE ER 60 MG PO TB24: ORAL_TABLET | ORAL | Status: DC

## 2014-07-19 MED ORDER — LISINOPRIL 10 MG PO TABS: 10.0000 mg | ORAL_TABLET | Freq: Every day | ORAL | Status: DC

## 2014-07-19 NOTE — Patient Instructions (Signed)
Your physician wants you to follow-up in: 6 months You will receive a reminder letter in the mail two months in advance. If you don't receive a letter, please call our office to schedule the follow-up appointment.    Your physician has recommended you make the following change in your medication:    DECREASE Imdur to 60 mg daily  INCREASE  Lisinopril 10 mg daily     Thank you for choosing [facility] !

## 2014-07-19 NOTE — Progress Notes (Signed)
Patient ID: Krystal [name], female   DOB: 10/13/1936, 78 y.o.   MRN: [id number]      SUBJECTIVE: The patient presents for routine cardiovascular followup. In summary, she has a history of chronic diastolic heart failure, hypertension, coronary artery disease with multiple previously placed stents, COPD and is on chronic oxygen at home (3L), sleep apnea and uses CPAP, hyperlipidemia, and insulin-dependent diabetes mellitus. Echocardiogram on 03/05/2014 demonstrated vigorous left ventricular systolic function, EF 28-41%, mild LVH, grade 1 diastolic dysfunction, elevated filling pressures, mild mitral regurgitation, trivial tricuspid regurgitation, and mildly elevated pulmonary pressures of 37 mmHg.   As per Dr. Evette Georges note on 02/14/14, she has coronary artery disease dating back to the 1990s and has undergone multiple stent procedures involving the proximal LAD, circumflex, mid right coronary artery, and distal right coronary artery. Her last catheterization in October 2013 did not reveal high-grade restenosis. She did have 80-90% ostial stenosis and a small jailed first septal perforating artery. She had mild calcification and 20-30% narrowing in the stent in the circumflex vessel. The right coronary artery had 3 stents and there was 40-50% in-stent smooth narrowing with 30% narrowing in the distal stent.   She had one episode of chest pain approximately one month ago which lasted one hour and was relieved with one sublingual nitroglycerin. She thinks her shortness of breath is stable. She denies leg swelling. She would like to reduce or come off of some of her medications.  Review of Systems: As per "subjective", otherwise negative.  Allergies  Allergen Reactions  . Morphine Shortness Of Breath and Swelling  . Penicillins Shortness Of Breath and Swelling    Current Outpatient Prescriptions  Medication Sig Dispense Refill  . aspirin 81 MG EC tablet Take 81 mg by mouth every morning.       .  budesonide-formoterol (SYMBICORT) 160-4.5 MCG/ACT inhaler Inhale 2 puffs into the lungs 2 (two) times daily as needed (for shortness of breath).      . calcitRIOL (ROCALTROL) 0.25 MCG capsule Take 0.25 mcg by mouth daily. Take on Monday, Wednesday, and Friday.      . clopidogrel (PLAVIX) 75 MG tablet Take 1 tablet (75 mg total) by mouth daily.  30 tablet  1  . ezetimibe (ZETIA) 10 MG tablet Take 10 mg by mouth every morning.       . famotidine (PEPCID) 20 MG tablet Take 1 tablet (20 mg total) by mouth 2 (two) times daily.  60 tablet  9  . ferrous sulfate 325 (65 FE) MG tablet Take 325 mg by mouth daily.       . Fluticasone-Salmeterol (ADVAIR DISKUS) 250-50 MCG/DOSE AEPB Inhale 1 puff into the lungs 2 (two) times daily.       Marland Kitchen HYDROcodone-acetaminophen (VICODIN) 5-500 MG per tablet Take 1 tablet by mouth 2 (two) times daily as needed for pain.       Marland Kitchen insulin aspart (NOVOLOG) 100 UNIT/ML injection Inject 45 Units into the skin 2 (two) times daily. 45 units in the morning and 40 units in the evening.      . insulin glargine (LANTUS) 100 UNIT/ML injection Inject 40 Units into the skin 2 (two) times daily.       . isosorbide mononitrate (IMDUR) 60 MG 24 hr tablet Take 1 & 1/2 tablet in the morning and 1/2 tablet in the PM.  60 tablet  3  . Liraglutide (VICTOZA) 18 MG/3ML SOPN Inject 3 mLs into the skin daily.       Marland Kitchen  loratadine (CLARITIN) 10 MG tablet Take 10 mg by mouth daily as needed for allergies.      . montelukast (SINGULAIR) 10 MG tablet Take 10 mg by mouth at bedtime.      . nebivolol (BYSTOLIC) 10 MG tablet Take 5-15 mg by mouth 2 (two) times daily. 1 & 1/2 tablets in the morning and 1/5 tablet in the evening.      Marland Kitchen NITROSTAT 0.4 MG SL tablet PLACE ONE (1) TABLET UNDER TONGUE EVERY 5 MINUTES UP TO (3) DOSES AS NEEDED FOR CHEST PAIN.  25 tablet  1  . NOVOLOG FLEXPEN 100 UNIT/ML FlexPen USE 25 UNITS AT BREAKFAST, 20 UNITS AT LUNCH AND 35 UNITS AT SUPPER SUB-Q.  30 mL  2  .  nystatin-triamcinolone ointment (MYCOLOG) Apply 1 application topically 2 (two) times daily. To affected area.  30 g  prn  . OXYGEN-HELIUM IN Inhale into the lungs. 2 liters continuous      . rosuvastatin (CRESTOR) 20 MG tablet Take 20 mg by mouth every evening.      Marland Kitchen spironolactone (ALDACTONE) 25 MG tablet Take 1/2 tablet daily  30 tablet  6  . torsemide (DEMADEX) 20 MG tablet Take 1 tablet (20 mg total) by mouth 2 (two) times daily.  180 tablet  3   No current facility-administered medications for this visit.    Past Medical History  Diagnosis Date  . Sleep apnea     on C-pap  . Occult blood in stools   . Myalgia   . Abnormal PFT   . DM (diabetes mellitus)     type II uncontrolled  . Palpitations   . Microalbuminuria   . Microcytic anemia   . DJD (degenerative joint disease)   . Constipation   . CAD (coronary artery disease) 2009    5 stents- #3 in RCA, #1 each in LAD and AVG  . Osteoarthritis   . Low back pain   . HTN (hypertension)   . Hyperlipidemia   . GERD (gastroesophageal reflux disease)   . Allergic rhinitis   . Personal history of colonic polyps   . HOH (hard of hearing)   . MI (myocardial infarction) 22  . Glaucoma   . Diastolic dysfunction, left ventricle 09/12/12  . Cancer 04/2010       . Acute renal failure due to procedure 09/18/2012  . CKD (chronic kidney disease) stage 3, GFR 30-59 ml/min 09/18/2012  . Heart block 02/2013    transient  . OSA on CPAP   . On home O2     2L N/C continuously    Past Surgical History  Procedure Laterality Date  . Ectopic pregnancy surgery    . Foot surgery      left and right for callous  . Cervical discectomy      L5 left/hemilminectomy  . Colonoscopy  09/2006    int hemmorhoids, COMPLICATED BY CARDIOPULMONARY COMPLICATIONS  . Abdominal hysterectomy  04/2010    uterine cancer,TAHBSO  . Polypectomy  10/22/2011    internal hemorrhoids/sessile polyp  . Coronary angioplasty  1/99, 1/07, 1/08, 4/09    5 cardiac  stents total    History   Social History  . Marital Status: Widowed    Spouse Name: N/A    Number of Children: N/A  . Years of Education: N/A   Occupational History  . Not on file.   Social History Main Topics  . Smoking status: Former Smoker -- 0.40 packs/day    Types: Cigarettes  Quit date: 10/18/2006  . Smokeless tobacco: Never Used     Comment: quit about 4 yrs ago  . Alcohol Use: No  . Drug Use: No  . Sexual Activity: No   Other Topics Concern  . Not on file   Social History Narrative  . No narrative on file      PHYSICAL EXAM General: NAD, using oxygen by nasal cannula.  Neck: No JVD, no thyromegaly.  Lungs: Diminished but clear b/l. No rales or wheezes appreciated.  CV: Nondisplaced PMI. Regular rate and rhythm, normal S1/S2, no S3/S4, no murmur. No pretibial or periankle edema.  Abdomen: Soft, nontender, no hepatosplenomegaly, no distention.  Neurologic: Alert and oriented x 3.  Psych: Normal affect.  Extremities: No clubbing or cyanosis.   ECG: Most recent ECG reviewed.    ASSESSMENT AND PLAN: 1. Chronic diastolic heart failure: She is currently euvolemic and stable. As per her request, I will d/c spironolactone 12.5 mg daily but continue torsemide 20 mg bid.   2. CAD: She underwent a low risk, normal nuclear stress test in September 2014. Continue current therapy with aspirin, Plavix, Zetia, Imdur (reduce dose to 60 mg daily), Bystolic, and Crestor.  3. Essential hypertension: Mildly elevated on current therapy. As I am d/c spironolactone and reducing Imdur, I will increase lisinopril to 10 mg daily.  4. Hyperlipidemia: Continue current therapy with Crestor and Zetia.   5. CKD: On 6/25, BUN 33, creat 1.52.  6. COPD: Chronic dyspnea with exertion appears to be stable on 3L oxygen.  Dispo: f/u 6 months.   Kate Sable, M.D., F.A.C.C.

## 2014-08-06 ENCOUNTER — Other Ambulatory Visit: Payer: Self-pay | Admitting: Cardiovascular Disease

## 2014-08-09 ENCOUNTER — Other Ambulatory Visit: Payer: Self-pay | Admitting: *Deleted

## 2014-08-20 DIAGNOSIS — E0865 Diabetes mellitus due to underlying condition with hyperglycemia: Secondary | ICD-10-CM | POA: Diagnosis not present

## 2014-08-20 DIAGNOSIS — Z23 Encounter for immunization: Secondary | ICD-10-CM | POA: Diagnosis not present

## 2014-08-20 DIAGNOSIS — J449 Chronic obstructive pulmonary disease, unspecified: Secondary | ICD-10-CM | POA: Diagnosis not present

## 2014-08-20 DIAGNOSIS — I110 Hypertensive heart disease with heart failure: Secondary | ICD-10-CM | POA: Diagnosis not present

## 2014-08-20 DIAGNOSIS — E0822 Diabetes mellitus due to underlying condition with diabetic chronic kidney disease: Secondary | ICD-10-CM | POA: Diagnosis not present

## 2014-08-20 DIAGNOSIS — N183 Chronic kidney disease, stage 3 (moderate): Secondary | ICD-10-CM | POA: Diagnosis not present

## 2014-08-27 ENCOUNTER — Other Ambulatory Visit: Payer: Self-pay | Admitting: Cardiovascular Disease

## 2014-09-07 DIAGNOSIS — E1051 Type 1 diabetes mellitus with diabetic peripheral angiopathy without gangrene: Secondary | ICD-10-CM | POA: Diagnosis not present

## 2014-09-07 DIAGNOSIS — I739 Peripheral vascular disease, unspecified: Secondary | ICD-10-CM | POA: Diagnosis not present

## 2014-09-07 DIAGNOSIS — L603 Nail dystrophy: Secondary | ICD-10-CM | POA: Diagnosis not present

## 2014-09-13 DIAGNOSIS — R809 Proteinuria, unspecified: Secondary | ICD-10-CM | POA: Diagnosis not present

## 2014-09-13 DIAGNOSIS — Z79899 Other long term (current) drug therapy: Secondary | ICD-10-CM | POA: Diagnosis not present

## 2014-09-13 DIAGNOSIS — I10 Essential (primary) hypertension: Secondary | ICD-10-CM | POA: Diagnosis not present

## 2014-09-13 DIAGNOSIS — N183 Chronic kidney disease, stage 3 (moderate): Secondary | ICD-10-CM | POA: Diagnosis not present

## 2014-09-13 DIAGNOSIS — D649 Anemia, unspecified: Secondary | ICD-10-CM | POA: Diagnosis not present

## 2014-09-13 DIAGNOSIS — E559 Vitamin D deficiency, unspecified: Secondary | ICD-10-CM | POA: Diagnosis not present

## 2014-09-15 DIAGNOSIS — R809 Proteinuria, unspecified: Secondary | ICD-10-CM | POA: Diagnosis not present

## 2014-09-15 DIAGNOSIS — N184 Chronic kidney disease, stage 4 (severe): Secondary | ICD-10-CM | POA: Diagnosis not present

## 2014-09-15 DIAGNOSIS — N2581 Secondary hyperparathyroidism of renal origin: Secondary | ICD-10-CM | POA: Diagnosis not present

## 2014-09-15 DIAGNOSIS — I129 Hypertensive chronic kidney disease with stage 1 through stage 4 chronic kidney disease, or unspecified chronic kidney disease: Secondary | ICD-10-CM | POA: Diagnosis not present

## 2014-09-25 ENCOUNTER — Other Ambulatory Visit: Payer: Self-pay | Admitting: Cardiovascular Disease

## 2014-09-27 NOTE — Telephone Encounter (Signed)
Rx refill sent to patient pharmacy   

## 2014-09-28 DIAGNOSIS — J309 Allergic rhinitis, unspecified: Secondary | ICD-10-CM | POA: Diagnosis not present

## 2014-09-28 DIAGNOSIS — E0865 Diabetes mellitus due to underlying condition with hyperglycemia: Secondary | ICD-10-CM | POA: Diagnosis not present

## 2014-09-28 DIAGNOSIS — E0822 Diabetes mellitus due to underlying condition with diabetic chronic kidney disease: Secondary | ICD-10-CM | POA: Diagnosis not present

## 2014-09-28 DIAGNOSIS — M549 Dorsalgia, unspecified: Secondary | ICD-10-CM | POA: Diagnosis not present

## 2014-09-28 DIAGNOSIS — I110 Hypertensive heart disease with heart failure: Secondary | ICD-10-CM | POA: Diagnosis not present

## 2014-10-12 DIAGNOSIS — G4730 Sleep apnea, unspecified: Secondary | ICD-10-CM | POA: Diagnosis not present

## 2014-10-12 DIAGNOSIS — E1165 Type 2 diabetes mellitus with hyperglycemia: Secondary | ICD-10-CM | POA: Diagnosis not present

## 2014-10-12 DIAGNOSIS — I10 Essential (primary) hypertension: Secondary | ICD-10-CM | POA: Diagnosis not present

## 2014-10-12 DIAGNOSIS — J449 Chronic obstructive pulmonary disease, unspecified: Secondary | ICD-10-CM | POA: Diagnosis not present

## 2014-10-19 ENCOUNTER — Other Ambulatory Visit (HOSPITAL_COMMUNITY): Payer: Self-pay | Admitting: Pulmonary Disease

## 2014-10-19 DIAGNOSIS — I110 Hypertensive heart disease with heart failure: Secondary | ICD-10-CM | POA: Diagnosis not present

## 2014-10-19 DIAGNOSIS — E1165 Type 2 diabetes mellitus with hyperglycemia: Secondary | ICD-10-CM | POA: Diagnosis not present

## 2014-10-19 DIAGNOSIS — R253 Fasciculation: Secondary | ICD-10-CM

## 2014-10-19 DIAGNOSIS — N3090 Cystitis, unspecified without hematuria: Secondary | ICD-10-CM | POA: Diagnosis not present

## 2014-10-20 ENCOUNTER — Encounter: Payer: Self-pay | Admitting: Gastroenterology

## 2014-10-20 ENCOUNTER — Ambulatory Visit (INDEPENDENT_AMBULATORY_CARE_PROVIDER_SITE_OTHER): Payer: Medicare Other | Admitting: Gastroenterology

## 2014-10-20 VITALS — BP 137/65 | HR 78 | Temp 96.9°F | Ht 67.0 in | Wt 201.0 lb

## 2014-10-20 DIAGNOSIS — I2510 Atherosclerotic heart disease of native coronary artery without angina pectoris: Secondary | ICD-10-CM

## 2014-10-20 DIAGNOSIS — R197 Diarrhea, unspecified: Secondary | ICD-10-CM

## 2014-10-20 NOTE — Progress Notes (Signed)
PATIENT SCHEDULED FOR 11/25/14 AT 11:30 AND IS AWARE

## 2014-10-20 NOTE — Progress Notes (Signed)
Subjective:    Patient ID: Krystal [name], female    DOB: [birth year], 78 y.o.   MRN: [id number]  Krystal Font, MD   HPI SNEEZED ABOUT 2 MOS AGO. USING [employer] BECAUSE HER PILES CAME DOWN. EVERY TIME SHE EATS SHE HAS TO GO TO THE BATHROOM(SLUDGE, FLUFFY PIECES). BMS: EATS AND 1 HR SHE HAS A BOWEL. STANDING AT [facility] DOWN(x2~2 WEEKS AGO). GOES SEE HER AUNT: 2 WEEKS AND SHE WENT 3 TIMES(~ 52MO AGO). ABX WITHIN THE LAST 2 MOS. NO TRAVEL OR FRESH WATER. MODERATE NAUSEA-DAILY. MILK: CORN FLAKES YESTERDAY. CHEESE: NONE. ICE CREAM: SOME NIGHTS(3X/WEEK). BMs: IF SHE EATS. HAS A BM.   PT DENIES FEVER, CHILLS, HEMATOCHEZIA, vomiting, melena, CHEST PAIN, SHORTNESS OF BREATH,  constipation, abdominal pain, problems swallowing, OR heartburn or indigestion.   Past Medical History  Diagnosis Date  . Sleep apnea     on C-pap  . Occult blood in stools   . Myalgia   . Abnormal PFT   . DM (diabetes mellitus)     type II uncontrolled  . Palpitations   . Microalbuminuria   . Microcytic anemia   . DJD (degenerative joint disease)   . Constipation   . CAD (coronary artery disease) 2009    5 stents- #3 in RCA, #1 each in LAD and AVG  . Osteoarthritis   . Low back pain   . HTN (hypertension)   . Hyperlipidemia   . GERD (gastroesophageal reflux disease)   . Allergic rhinitis   . Personal history of colonic polyps   . HOH (hard of hearing)   . MI (myocardial infarction) 63  . Glaucoma   . Diastolic dysfunction, left ventricle 09/12/12  . Cancer 04/2010       . Acute renal failure due to procedure 09/18/2012  . CKD (chronic kidney disease) stage 3, GFR 30-59 ml/min 09/18/2012  . Heart block 02/2013    transient  . OSA on CPAP   . On home O2     2L N/C continuously   Past Surgical History  Procedure Laterality Date  . Ectopic pregnancy surgery    . Foot surgery      left and right for callous  . Cervical discectomy      L5 left/hemilminectomy  .  Colonoscopy  09/2006    int hemmorhoids, COMPLICATED BY CARDIOPULMONARY COMPLICATIONS  . Abdominal hysterectomy  04/2010    uterine cancer,TAHBSO  . Polypectomy  10/22/2011    internal hemorrhoids/sessile polyp  . Coronary angioplasty  1/99, 1/07, 1/08, 4/09    5 cardiac stents total    Allergies  Allergen Reactions  . Morphine Shortness Of Breath and Swelling  . Penicillins Shortness Of Breath and Swelling    Current Outpatient Prescriptions  Medication Sig Dispense Refill  . aspirin 81 MG EC tablet Take 81 mg by mouth every morning.     . budesonide-formoterol (SYMBICORT) 160-4.5 MCG/ACT inhaler Inhale 2 puffs into the lungs 2 (two) times daily as needed (for shortness of breath).    . calcitRIOL (ROCALTROL) 0.25 MCG capsule Take 0.25 mcg by mouth daily. Take on Monday, Wednesday, and Friday.    . clopidogrel (PLAVIX) 75 MG tablet TAKE 1 TABLET ONCE DAILY.    Marland Kitchen ezetimibe (ZETIA) 10 MG tablet Take 10 mg by mouth every morning.     . famotidine (PEPCID) 20 MG tablet TAKE (1) TABLET TWICE DAILY.    . ferrous sulfate 325 (65 FE) MG tablet  Take 325 mg by mouth daily.     . Fluticasone-Salmeterol (ADVAIR DISKUS) 250-50 MCG/DOSE AEPB Inhale 1 puff into the lungs 2 (two) times daily.     Marland Kitchen HYDROcodone-acetaminophen (VICODIN) 5-500 MG per tablet Take 1 tablet by mouth 2 (two) times daily as needed for pain.     Marland Kitchen insulin glargine (LANTUS) 100 UNIT/ML injection Inject 40 Units into the skin 2 (two) times daily.     . isosorbide mononitrate (IMDUR) 60 MG 24 hr tablet Take 60 mg daily    . lisinopril (PRINIVIL,ZESTRIL) 10 MG tablet Take 1 tablet (10 mg total) by mouth daily.    . montelukast (SINGULAIR) 10 MG tablet Take 10 mg by mouth at bedtime.    . nebivolol (BYSTOLIC) 10 MG tablet Take 5 mg by mouth 2 (two) times daily. Takes 5 mg in the AM and 2.5 mg pm    . NOVOLOG FLEXPEN 100 UNIT/ML FlexPen USE 25 UNITS AT BREAKFAST, 20 UNITS AT LUNCH AND 35 UNITS AT SUPPER SUB-Q. (Patient taking  differently: 40 units in the morning)    . nystatin-triamcinolone ointment (MYCOLOG) Apply 1 application topically 2 (two) times daily. To affected area.    Donell Sievert IN Inhale into the lungs. 2 liters continuous    . rosuvastatin (CRESTOR) 20 MG tablet Take 20 mg by mouth every evening.    . torsemide (DEMADEX) 20 MG tablet Take 1 tablet (20 mg total) by mouth 2 (two) times daily.    . insulin aspart (NOVOLOG) 100 UNIT/ML injection Inject 45 Units into the skin 2 (two) times daily. 40 units in the morning and 40 units in the evening.    Marland Kitchen levofloxacin (LEVAQUIN) 500 MG tablet Take 500 mg by mouth daily. LAST DOSE 3 WEEKS AGO   . Liraglutide (VICTOZA) 18 MG/3ML SOPN Inject 3 mLs into the skin daily. As directed    . loratadine (CLARITIN) 10 MG tablet Take 10 mg by mouth daily as needed for allergies.    Marland Kitchen NITROSTAT 0.4 MG SL tablet PLACE ONE (1) TABLET UNDER TONGUE EVERY 5 MINUTES UP TO (3) DOSES AS NEEDED FOR CHEST PAIN. (Patient not taking: Reported on 10/20/2014)     History  Substance Use Topics  . Smoking status: Former Smoker -- 0.40 packs/day    Types: Cigarettes    Quit date: 10/18/2006  . Smokeless tobacco: Never Used     Comment: quit about 4 yrs ago  . Alcohol Use: No    Family History  Problem Relation Age of Onset  . Anesthesia problems Neg Hx   . Hypotension Neg Hx   . Malignant hyperthermia Neg Hx   . Pseudochol deficiency Neg Hx     Review of Systems     Objective:   Physical Exam  Constitutional: She is oriented to person, place, and time. She appears well-developed and well-nourished. No distress.  HENT:  Head: Normocephalic and atraumatic.  Mouth/Throat: Oropharynx is clear and moist. No oropharyngeal exudate.  Eyes: Pupils are equal, round, and reactive to light. No scleral icterus.  Neck: Normal range of motion. Neck supple.  Cardiovascular: Normal rate, regular rhythm and normal heart sounds.   Pulmonary/Chest: Effort normal and breath sounds  normal. No respiratory distress.  Abdominal: Soft. Bowel sounds are normal. She exhibits no distension. There is no tenderness.  Musculoskeletal: She exhibits no edema.  Lymphadenopathy:    She has no cervical adenopathy.  Neurological: She is alert and oriented to person, place, and time.  NO  NEW  FOCAL DEFICITS   Psychiatric:  FLAT AFFECT, NL MOOD   Vitals reviewed.         Assessment & Plan:

## 2014-10-20 NOTE — Assessment & Plan Note (Addendum)
ASSOCIATED WITH FECAL INCONTINENCE AND PRIOR HX NH VISITS AND ABX EXPOSURE. SX MOST LIKELY DUE TO C DIFF, LESS LIKELY MICROSCOPIC COLITIS OR IBD.  C DIFF PCR AND IF NEG THEN ILEOTCS WITH Bx. AVOID MILK AND ICE CREAM MAY EAT CHEESE ADD MOM WITH MEALS FOLLOW UP IN 1 MOS.

## 2014-10-20 NOTE — Patient Instructions (Signed)
  SUBMIT STOOL STUDYASAP. IF IT IS NEGATIVE FOR CLOSTRIDIUM DIFFICILE COLITIS THEN YOU WILL NEED A COLONOSCOPY.  AVOID MILK AND ICE CREAM.  YOU MAY EAT CHEESE ONCE A DAY.  ADD MILK OF MAGNESIA WITH MEALS.  FOLLOW UP IN 1 MOS.

## 2014-10-20 NOTE — Progress Notes (Signed)
cc'ed to pcp °

## 2014-10-22 ENCOUNTER — Ambulatory Visit (HOSPITAL_COMMUNITY)
Admission: RE | Admit: 2014-10-22 | Discharge: 2014-10-22 | Disposition: A | Discharge status: Home / Self Care | Payer: Medicare Other | Source: Ambulatory Visit | Attending: Pulmonary Disease | Admitting: Pulmonary Disease

## 2014-10-22 DIAGNOSIS — R253 Fasciculation: Secondary | ICD-10-CM | POA: Diagnosis not present

## 2014-10-22 DIAGNOSIS — R930 Abnormal findings on diagnostic imaging of skull and head, not elsewhere classified: Secondary | ICD-10-CM | POA: Insufficient documentation

## 2014-10-22 DIAGNOSIS — E119 Type 2 diabetes mellitus without complications: Secondary | ICD-10-CM | POA: Diagnosis not present

## 2014-10-22 DIAGNOSIS — I10 Essential (primary) hypertension: Secondary | ICD-10-CM | POA: Diagnosis not present

## 2014-10-22 DIAGNOSIS — G9389 Other specified disorders of brain: Secondary | ICD-10-CM | POA: Diagnosis not present

## 2014-10-26 LAB — CLOSTRIDIUM DIFFICILE BY PCR: Toxigenic C. Difficile by PCR: NOT DETECTED

## 2014-10-27 ENCOUNTER — Other Ambulatory Visit: Payer: Self-pay | Admitting: Cardiovascular Disease

## 2014-10-28 ENCOUNTER — Encounter (HOSPITAL_COMMUNITY): Payer: Self-pay | Admitting: Cardiovascular Disease

## 2014-10-28 MED ORDER — NEBIVOLOL HCL 5 MG PO TABS: ORAL_TABLET | ORAL | Status: DC

## 2014-10-28 NOTE — Telephone Encounter (Signed)
Talked to [place] pt is not taking Bystolic correctly. She is currently taking 1 every other day. LM for pt to call back. Received RX request for Bystolic 2.5 mg daily. LOV is 5-15mg  BID 1 1/2 AM and 1/2 PM. Will forward to Dr. Bronson Ing for correct dosing

## 2014-10-28 NOTE — Telephone Encounter (Signed)
Have her take 5 mg bid.

## 2014-11-01 ENCOUNTER — Encounter: Payer: Self-pay | Admitting: Neurology

## 2014-11-01 ENCOUNTER — Ambulatory Visit (INDEPENDENT_AMBULATORY_CARE_PROVIDER_SITE_OTHER): Payer: Medicare Other | Admitting: Neurology

## 2014-11-01 ENCOUNTER — Telehealth: Payer: Self-pay | Admitting: Neurology

## 2014-11-01 VITALS — BP 139/58 | HR 80 | Temp 98.0°F | Ht 66.0 in | Wt 200.0 lb

## 2014-11-01 DIAGNOSIS — Z9989 Dependence on other enabling machines and devices: Secondary | ICD-10-CM

## 2014-11-01 DIAGNOSIS — G4733 Obstructive sleep apnea (adult) (pediatric): Secondary | ICD-10-CM | POA: Diagnosis not present

## 2014-11-01 DIAGNOSIS — R259 Unspecified abnormal involuntary movements: Secondary | ICD-10-CM | POA: Diagnosis not present

## 2014-11-01 NOTE — Patient Instructions (Signed)
We will do a brain MRI with and without contrast and a brain electrical activity test, called EEG. We will also do blood work and try to get your blood test results from Dr. Cathey Endow office.  I am not sure how to explain your intermittent twitching and we will do more tests to figure it out.

## 2014-11-01 NOTE — Telephone Encounter (Signed)
Per Dr Rexene Alberts, need recent labs from pcp, called and they will fax results asap

## 2014-11-01 NOTE — Progress Notes (Signed)
Subjective:    Patient ID: Krystal [name] is a 78 y.o. female.  HPI     Star Age, MD, PhD [facility] [address] [place], [place] [address]  Dear Dr. Luan Pulling,   I saw your patient, Krystal [name], upon your kind request in my neurologic clinic today for initial consultation of her abnormal mouth twitching and mouth drawing sensation. The patient is unaccompanied today. As you know, Ms. Twitty is a 78 year old right-handed woman with an underlying medical history of emphysema with pulmonary hypertension, hypertension, hyperlipidemia, sleep apnea, type 2 diabetes, uterine cancer, heart disease, obesity and ex-smoking, who reports lip twitching and a drawing type sensation in her face around her lips and occasional jerking in her upper body. This has been ongoing for about 6 months, off and on, maybe once every 2 days. It affects the lips more, but she can have upper body jerking as well. She had blood work with Dr. Berdine Addison on 10/19/14. She takes hydrocodone very occasionally, maybe every 2 weeks and she has not seen a correlation. She has not been on Reglan or an antidepressant. She had a CTH wo contrast on 10/22/14, which you ordered: Asymmetric area of hypoattenuation in the LEFT posterior frontal cortex and subcortical white matter. See discussion above. Recommend MRI brain without and with contrast for further evaluation. I personally reviewed the images through the PACS system.  Of note, she is on multiple medications including diastolic, Plavix, Crestor, baby aspirin, famotidine, furosemide, isosorbide mononitrate, spironolactone, torsemide, Zetia, hydrocodone as needed, amlodipine, Crestor, famotidine, NovoLog and Victoza.   Never had TIA or stroke symptoms, denying sudden onset of one sided weakness, numbness, tingling, slurring of speech or droopy face, hearing loss, tinnitus, diplopia or visual field cut or monocular loss of vision, and  denies recurrent headaches, except for occasional sinus type headache she says.  She has not used her CPAP in the last couple weeks because of issues with the mask as I understand. Generally speaking she states she is compliant with CPAP.  Her Past Medical History Is Significant For: Past Medical History  Diagnosis Date  . Sleep apnea     on C-pap  . Occult blood in stools   . Myalgia   . Abnormal PFT   . DM (diabetes mellitus)     type II uncontrolled  . Palpitations   . Microalbuminuria   . Microcytic anemia   . DJD (degenerative joint disease)   . Constipation   . CAD (coronary artery disease) 2009    5 stents- #3 in RCA, #1 each in LAD and AVG  . Osteoarthritis   . Low back pain   . HTN (hypertension)   . Hyperlipidemia   . GERD (gastroesophageal reflux disease)   . Allergic rhinitis   . Personal history of colonic polyps   . HOH (hard of hearing)   . MI (myocardial infarction) 67  . Glaucoma   . Diastolic dysfunction, left ventricle 09/12/12  . Cancer 04/2010       . Acute renal failure due to procedure 09/18/2012  . CKD (chronic kidney disease) stage 3, GFR 30-59 ml/min 09/18/2012  . Heart block 02/2013    transient  . OSA on CPAP   . On home O2     2L N/C continuously  . Emphysema lung     Her Past Surgical History Is Significant For: Past Surgical History  Procedure Laterality Date  . Ectopic pregnancy surgery    .  Foot surgery      left and right for callous  . Cervical discectomy      L5 left/hemilminectomy  . Colonoscopy  09/2006    int hemmorhoids, COMPLICATED BY CARDIOPULMONARY COMPLICATIONS  . Abdominal hysterectomy  04/2010    uterine cancer,TAHBSO  . Polypectomy  10/22/2011    internal hemorrhoids/sessile polyp  . Coronary angioplasty  1/99, 1/07, 1/08, 4/09    5 cardiac stents total  . Left and right heart catheterization with coronary angiogram N/A 09/12/2012    Procedure: LEFT AND RIGHT HEART CATHETERIZATION WITH CORONARY ANGIOGRAM;   Surgeon: Sanda Klein, MD;  Location: [facility] CATH LAB;  Service: Cardiovascular;  Laterality: N/A;    Her Family History Is Significant For: Family History  Problem Relation Age of Onset  . Anesthesia problems Neg Hx   . Hypotension Neg Hx   . Malignant hyperthermia Neg Hx   . Pseudochol deficiency Neg Hx     Her Social History Is Significant For: History   Social History  . Marital Status: Widowed    Spouse Name: N/A    Number of Children: 0  . Years of Education: 12   Occupational History  .      retired   Social History Main Topics  . Smoking status: Former Smoker -- 0.40 packs/day    Types: Cigarettes    Quit date: 10/18/2006  . Smokeless tobacco: Never Used     Comment: quit about 4 yrs ago  . Alcohol Use: No  . Drug Use: No  . Sexual Activity: No   Other Topics Concern  . None   Social History Narrative   Consumes 2 cups of caffeine daily    Her Allergies Are:  Allergies  Allergen Reactions  . Morphine Shortness Of Breath and Swelling  . Penicillins Shortness Of Breath and Swelling  :   Her Current Medications Are:  Outpatient Encounter Prescriptions as of 11/01/2014  Medication Sig  . AMLODIPINE BESYLATE PO Take 5 mg by mouth daily.  Marland Kitchen aspirin 81 MG EC tablet Take 81 mg by mouth every morning.   . budesonide-formoterol (SYMBICORT) 160-4.5 MCG/ACT inhaler Inhale 2 puffs into the lungs 2 (two) times daily as needed (for shortness of breath).  . calcitRIOL (ROCALTROL) 0.25 MCG capsule Take 0.25 mcg by mouth daily. Take on Monday, Wednesday, and Friday.  . Cholecalciferol (VITAMIN D3) 2000 UNITS TABS Take by mouth daily.  . clobetasol ointment (TEMOVATE) 8.10 % Apply 1 application topically 2 (two) times daily.  . clopidogrel (PLAVIX) 75 MG tablet TAKE 1 TABLET ONCE DAILY.  Marland Kitchen ezetimibe (ZETIA) 10 MG tablet Take 10 mg by mouth every morning.   . famotidine (PEPCID) 20 MG tablet TAKE (1) TABLET TWICE DAILY.  . ferrous sulfate 325 (65 FE) MG tablet Take 325  mg by mouth daily.   . Fluticasone-Salmeterol (ADVAIR DISKUS) 250-50 MCG/DOSE AEPB Inhale 1 puff into the lungs 2 (two) times daily.   . furosemide (LASIX) 40 MG tablet Take 40 mg by mouth 2 (two) times daily.  Marland Kitchen HYDROcodone-acetaminophen (VICODIN) 5-500 MG per tablet Take 1 tablet by mouth 2 (two) times daily as needed for pain.   . Influenza Vac Typ A&B Surf Ant (FLUVIRIN) SUSP Inject into the muscle.  . insulin aspart (NOVOLOG) 100 UNIT/ML injection Inject 45 Units into the skin 2 (two) times daily. 40 units in the morning and 40 units in the evening.  . insulin glargine (LANTUS) 100 UNIT/ML injection Inject 40 Units into the skin 2 (two)  times daily.   . isosorbide mononitrate (IMDUR) 60 MG 24 hr tablet Take 60 mg daily  . levocetirizine (XYZAL) 5 MG tablet Take 5 mg by mouth every evening.  Marland Kitchen levofloxacin (LEVAQUIN) 500 MG tablet Take 500 mg by mouth daily.  . Liraglutide (VICTOZA) 18 MG/3ML SOPN Inject 3 mLs into the skin daily. As directed  . lisinopril (PRINIVIL,ZESTRIL) 10 MG tablet Take 1 tablet (10 mg total) by mouth daily.  Marland Kitchen loratadine (CLARITIN) 10 MG tablet Take 10 mg by mouth daily as needed for allergies.  . montelukast (SINGULAIR) 10 MG tablet Take 10 mg by mouth at bedtime.  . nebivolol (BYSTOLIC) 5 MG tablet Take 1 tab 2 times daily  . NITROSTAT 0.4 MG SL tablet PLACE ONE (1) TABLET UNDER TONGUE EVERY 5 MINUTES UP TO (3) DOSES AS NEEDED FOR CHEST PAIN.  Marland Kitchen NOVOLOG FLEXPEN 100 UNIT/ML FlexPen USE 25 UNITS AT BREAKFAST, 20 UNITS AT LUNCH AND 35 UNITS AT SUPPER SUB-Q. (Patient taking differently: 40 units in the morning)  . nystatin-triamcinolone ointment (MYCOLOG) Apply 1 application topically 2 (two) times daily. To affected area.  Donell Sievert IN Inhale into the lungs. 2 liters continuous  . POLYETHYLENE GLYCOL 3350-GRX PO Take by mouth as needed.  . rosuvastatin (CRESTOR) 20 MG tablet Take 20 mg by mouth every evening.  Marland Kitchen spironolactone (ALDACTONE) 25 MG tablet Take 25 mg  by mouth daily.  Marland Kitchen sulfamethoxazole-trimethoprim (BACTRIM DS) 800-160 MG per tablet Take 1 tablet by mouth 2 (two) times daily.  Marland Kitchen tiotropium (SPIRIVA) 18 MCG inhalation capsule Place 18 mcg into inhaler and inhale daily.  Marland Kitchen torsemide (DEMADEX) 20 MG tablet Take 1 tablet (20 mg total) by mouth 2 (two) times daily.  :   Review of Systems:  Out of a complete 14 point review of systems, all are reviewed and negative with the exception of these symptoms as listed below:   Review of Systems  Constitutional: Positive for fatigue.       Weight loss  Respiratory: Positive for shortness of breath and wheezing.        Snoring  Cardiovascular: Positive for chest pain.  Gastrointestinal: Positive for diarrhea and constipation.  Endocrine:       Increased thirst  Genitourinary:       Urination problems  Skin:       moles  Allergic/Immunologic: Positive for environmental allergies.       Runny nose,   Psychiatric/Behavioral:       Change in appetite    Objective:  Neurologic Exam  Physical Exam Physical Examination:   Filed Vitals:   11/01/14 1340  BP: 139/58  Pulse: 80  Temp: 98 F (36.7 C)    General Examination: The patient is a very pleasant 78 y.o. female in no acute distress. She appears well-developed and well-nourished and very well groomed.   HEENT: Normocephalic, atraumatic, pupils are equal, round and reactive to light and accommodation. Funduscopic exam is normal with sharp disc margins noted. Extraocular tracking is good without limitation to gaze excursion or nystagmus noted. Normal smooth pursuit is noted. Hearing is grossly intact. Tympanic membranes are clear bilaterally. Face is symmetric with normal facial animation and normal facial sensation. Speech is clear with no dysarthria noted. There is no hypophonia. There is no lip, neck/head, jaw or voice tremor. Neck is supple with full range of passive and active motion. There are no carotid bruits on auscultation.  Oropharynx exam reveals: moderate mouth dryness, adequate dental hygiene and moderate airway crowding,  due to larger tongue and redundant soft palate. Mallampati is class II. Tongue protrudes centrally and palate elevates symmetrically.   Chest: Clear to auscultation without wheezing, rhonchi or crackles noted.  Heart: S1+S2+0, regular and normal without murmurs, rubs or gallops noted.   Abdomen: Soft, non-tender and non-distended with normal bowel sounds appreciated on auscultation.  Extremities: There is no pitting edema in the distal lower extremities bilaterally. Pedal pulses are intact.  Skin: Warm and dry without trophic changes noted. There are no varicose veins.  Musculoskeletal: exam reveals no obvious joint deformities, tenderness or joint swelling or erythema.   Neurologically:  Mental status: The patient is awake, alert and oriented in all 4 spheres. Her immediate and remote memory, attention, language skills and fund of knowledge are appropriate. There is no evidence of aphasia, agnosia, apraxia or anomia. Speech is clear with normal prosody and enunciation. Thought process is linear. Mood is normal and affect is normal.  Cranial nerves II - XII are as described above under HEENT exam. In addition: shoulder shrug is normal with equal shoulder height noted. Motor exam: Normal bulk, strength and tone is noted. There is no drift, tremor or rebound. There is no evidence of abnormal involuntary movements such as dystonic or athetoid movements or myoclonus. Romberg is negative. Reflexes are 2+ throughout. Babinski: Toes are flexor bilaterally. Fine motor skills and coordination: intact with normal finger taps, normal hand movements, normal rapid alternating patting, normal foot taps and normal foot agility.  Cerebellar testing: No dysmetria or intention tremor on finger to nose testing. Heel to shin is unremarkable bilaterally. There is no truncal or gait ataxia.  Sensory exam: intact to  light touch, pinprick, vibration, temperature sense in the upper and lower extremities.  Gait, station and balance: She stands with difficulty and reports midline low back pain. No veering to one side is noted. No leaning to one side is noted. Posture is age-appropriate and stance is narrow based. She walks slightly slowly. She turns well. Tandem walk is mildly difficult for her.   Assessment and Plan:   In summary, KARIA EHRESMAN is a very pleasant 78 y.o.-year old female with an underlying medical history of emphysema with pulmonary hypertension, hypertension, hyperlipidemia, sleep apnea, type 2 diabetes, uterine cancer, heart disease, obesity and ex-smoking, who reports lip twitching and a drawing type sensation in her face around her lips and occasional jerking in her upper body. This has been ongoing for about 6 months, off and on, maybe once every 2 days. She denies one-sided symptoms. Problems are intermittent and currently she does not have any symptoms or signs. In fact, her neurological exam is nonfocal at this time. I reassured her in that regard. Nevertheless, we talked about her head CT results and the fact that she would benefit from an MRI brain with and without contrast which I will order. I'm not sure how to explain her involuntary twitching or drawing sensation. The list of differential diagnoses is potentially quite long. This may include muscle jerking secondary to hypoxemia at night especially if she is not always compliant with CPAP therapy. She usually uses it every night she states. She has not used it regularly in the past 2 weeks but her symptoms have been ongoing for 6 months or so. Sometimes narcotic pain medications can cause myoclonus but she also does not take the hydrocodone very often at all. We will do some blood work today and also request Dr. Cathey Endow blood test results from 10/19/2014. I would  like to see whether she had a B12 check and TSH done. I would like to check a  ceruloplasmin and copper level as well as rheumatological and autoimmune tests. We will do an EEG and as mentioned the brain MRI with and without contrast. We will call her with her test results as we get them back. I will see her back soon after all these are done. I did not suggest any new medications. She is on multiple medications and it is impossible to say if there could be a medication related side effect. I answered all her questions today and the patient was in agreement with the above outlined plan.  Thank you very much for allowing me to participate in the care of this nice patient. If I can be of any further assistance to you please do not hesitate to call me at [phone].  Sincerely,   Star Age, MD, PhD

## 2014-11-03 ENCOUNTER — Telehealth: Payer: Self-pay | Admitting: Neurology

## 2014-11-03 LAB — COMPREHENSIVE METABOLIC PANEL
A/G RATIO: 1.6 (ref 1.1–2.5)
ALT: 13 IU/L (ref 0–32)
AST: 15 IU/L (ref 0–40)
Albumin: 4.4 g/dL (ref 3.5–4.8)
Alkaline Phosphatase: 70 IU/L (ref 39–117)
BUN/Creatinine Ratio: 19 (ref 11–26)
BUN: 44 mg/dL — ABNORMAL HIGH (ref 8–27)
CO2: 26 mmol/L (ref 18–29)
CREATININE: 2.33 mg/dL — AB (ref 0.57–1.00)
Calcium: 9.4 mg/dL (ref 8.7–10.3)
Chloride: 92 mmol/L — ABNORMAL LOW (ref 97–108)
GFR calc Af Amer: 23 mL/min/{1.73_m2} — ABNORMAL LOW (ref >59)
GFR, EST NON AFRICAN AMERICAN: 20 mL/min/{1.73_m2} — AB (ref >59)
GLOBULIN, TOTAL: 2.8 g/dL (ref 1.5–4.5)
GLUCOSE: 336 mg/dL — AB (ref 65–99)
Potassium: 4.2 mmol/L (ref 3.5–5.2)
Sodium: 136 mmol/L (ref 134–144)
TOTAL PROTEIN: 7.2 g/dL (ref 6.0–8.5)
Total Bilirubin: 0.2 mg/dL (ref 0.0–1.2)

## 2014-11-03 LAB — TSH: TSH: 3.95 u[IU]/mL (ref 0.450–4.500)

## 2014-11-03 LAB — COPPER, SERUM: Copper: 129 ug/dL (ref 72–166)

## 2014-11-03 LAB — RPR: RPR: NONREACTIVE

## 2014-11-03 LAB — ANA W/REFLEX: Anti Nuclear Antibody(ANA): NEGATIVE

## 2014-11-03 LAB — CERULOPLASMIN: Ceruloplasmin: 28.9 mg/dL (ref 16.0–45.0)

## 2014-11-03 NOTE — Telephone Encounter (Signed)
Received labs from [name] office per Dr Guadelupe Sabin request, giving to physician for review

## 2014-11-03 NOTE — Progress Notes (Signed)
Quick Note:  Pls call patient: thyroid screening test called TSH and autoimmune antibody screening was normal. However, sugar level was too high (336), kidney function impaired and I would like for her to see her PCP ASAP for this. She may be dehydrated, which could, at least in part explain her twitching. Pls let her know to make an appointment with PCP and fax labs to PCP's office too, thx Star Age, MD, PhD [facility] ([place])  ______

## 2014-11-03 NOTE — Telephone Encounter (Signed)
Labs not received as of yet. Called [name] office again and asked that the patient's lab results be faxed to our office and they said that their office will fax the results

## 2014-11-03 NOTE — Telephone Encounter (Signed)
Patient returning Ammie's call. °

## 2014-11-03 NOTE — Telephone Encounter (Signed)
Called patient with lab results ,line busy, second attempt.

## 2014-11-04 ENCOUNTER — Ambulatory Visit (INDEPENDENT_AMBULATORY_CARE_PROVIDER_SITE_OTHER): Payer: Medicare Other | Admitting: Diagnostic Neuroimaging

## 2014-11-04 DIAGNOSIS — G4733 Obstructive sleep apnea (adult) (pediatric): Secondary | ICD-10-CM

## 2014-11-04 DIAGNOSIS — R259 Unspecified abnormal involuntary movements: Secondary | ICD-10-CM

## 2014-11-04 DIAGNOSIS — Z9989 Dependence on other enabling machines and devices: Secondary | ICD-10-CM

## 2014-11-04 NOTE — Telephone Encounter (Signed)
Spoke with patient and gave lab results and she verbalized understanding , stated that she calling her PCP to schedule appointment.

## 2014-11-09 NOTE — Progress Notes (Signed)
Quick Note:  Please call and advise the patient that the EEG or brain wave test we performed was reported as normal in the awake state. We checked for abnormal electrical discharges in the brain waves and the report suggested normal findings. No further action is required on this test at this time. Please remind patient to keep any upcoming appointments or tests and to call us with any interim questions, concerns, problems or updates. Thanks,  Star Age, MD, PhD    ______

## 2014-11-09 NOTE — Procedures (Signed)
   [facility]  EEG (ELECTROENCEPHALOGRAM) REPORT   STUDY DATE: 11/04/14  PATIENT NAME: Krystal [name] DOB: [birth year] MRN: [id number]  ORDERING CLINICIAN: Star Age, MD PhD   TECHNOLOGIST: Laretta Alstrom  TECHNIQUE: Electroencephalogram was recorded utilizing standard 10-20 system of lead placement and reformatted into average and bipolar montages.  RECORDING TIME: 30 minutes  ACTIVATION: photic stimulation  CLINICAL INFORMATION: 78 year old female with lip twitching.  FINDINGS: Background rhythms of 9-10 hertz and 30-40 microvolts. No focal, lateralizing, epileptiform activity or seizures are seen. Patient recorded in the awake state.  IMPRESSION:  Normal EEG in the awake state.    INTERPRETING PHYSICIAN:  Penni Bombard, MD Certified in Neurology, Neurophysiology and Neuroimaging  [facility] [address] [place], [place] [address] [phone]

## 2014-11-18 NOTE — Progress Notes (Deleted)
Does pt still need her OV on 11/25/2014?

## 2014-11-18 NOTE — Progress Notes (Signed)
Pt is scheduled an OV with Dr. Oneida Alar on 11/25/2014 at 11:30 AM.

## 2014-11-18 NOTE — Progress Notes (Signed)
Called pt and she wants to wait until she sees Dr. Oneida Alar before scheduling her procedure. She is aware that her CDIFF was negative.

## 2014-11-18 NOTE — Progress Notes (Signed)
PLEASE CALL PT. HER C DIFF TEST IS NEGATIVE. SHE NEEDS TO HAVE A TCS W/ PROPOFOL IF HER DIARRHEA IS NOT BETTER. PT NEEDS MOVIPREP-REGULAR BREAKFAST THEN CLEAR LIQUIDS AFTER 9 AM. HOLD IRON FOR 7 DAYS PRIOR TO TCS. TAKE HALF LANTUS DOSE ON NIGHT BEFORE TCS. HOLD LANTUS, DEMADEX, AND VICTOZA DOSE ON MORNING OF TCS. MAY USE NOVOLOG SLIDING SCALE ON DAY OF TCS.

## 2014-11-24 DIAGNOSIS — J019 Acute sinusitis, unspecified: Secondary | ICD-10-CM | POA: Diagnosis not present

## 2014-11-24 DIAGNOSIS — E1165 Type 2 diabetes mellitus with hyperglycemia: Secondary | ICD-10-CM | POA: Diagnosis not present

## 2014-11-25 ENCOUNTER — Ambulatory Visit (INDEPENDENT_AMBULATORY_CARE_PROVIDER_SITE_OTHER): Payer: Medicare Other | Admitting: Gastroenterology

## 2014-11-25 ENCOUNTER — Encounter: Payer: Self-pay | Admitting: Gastroenterology

## 2014-11-25 VITALS — BP 145/68 | HR 85 | Temp 97.4°F | Ht 67.0 in | Wt 201.8 lb

## 2014-11-25 DIAGNOSIS — R197 Diarrhea, unspecified: Secondary | ICD-10-CM

## 2014-11-25 NOTE — Patient Instructions (Signed)
I CALLED [name]. YOU WILL BE TAKING LEVAQUIN FOR 7 DAYS.  TAKE A PROBIOTIC DAILY FOR 2 WEEKS.  CONTINUE TO MINIMIZE THE DAIRY PRODUCTS YOU EAT OR DRINK.  PLEASE CALL WITH QUESTIONS OR CONCERNS.  FOLLOW UP IN 4 MOS.

## 2014-11-25 NOTE — Progress Notes (Signed)
Subjective:    Patient ID: Krystal [name], female    DOB: August 16, 1936, 79 y.o.   MRN: [id number]  [name], MD  HPI OCCASIONALLY GETS THE RUNS. NO LONGER MESSING UP HER CLOTHES. LEGS BETTER NOW FROM SWELLING. C/O URI. RECENT ABX CALLED IN.  Past Medical History  Diagnosis Date  . Sleep apnea     on C-pap  . Occult blood in stools   . Myalgia   . Abnormal PFT   . DM (diabetes mellitus)     type II uncontrolled  . Palpitations   . Microalbuminuria   . Microcytic anemia   . DJD (degenerative joint disease)   . Constipation   . CAD (coronary artery disease) 2009    5 stents- #3 in RCA, #1 each in LAD and AVG  . Osteoarthritis   . Low back pain   . HTN (hypertension)   . Hyperlipidemia   . GERD (gastroesophageal reflux disease)   . Allergic rhinitis   . Personal history of colonic polyps   . HOH (hard of hearing)   . MI (myocardial infarction) 53  . Glaucoma   . Diastolic dysfunction, left ventricle 09/12/12  . Cancer 04/2010       . Acute renal failure due to procedure 09/18/2012  . CKD (chronic kidney disease) stage 3, GFR 30-59 ml/min 09/18/2012  . Heart block 02/2013    transient  . OSA on CPAP   . On home O2     2L N/C continuously  . Emphysema lung    Past Surgical History  Procedure Laterality Date  . Ectopic pregnancy surgery    . Foot surgery      left and right for callous  . Cervical discectomy      L5 left/hemilminectomy  . Colonoscopy  09/2006    int hemmorhoids, COMPLICATED BY CARDIOPULMONARY COMPLICATIONS  . Abdominal hysterectomy  04/2010    uterine cancer,TAHBSO  . Polypectomy  10/22/2011    internal hemorrhoids/sessile polyp  . Coronary angioplasty  1/99, 1/07, 1/08, 4/09    5 cardiac stents total  . Left and right heart catheterization with coronary angiogram N/A 09/12/2012    Procedure: LEFT AND RIGHT HEART CATHETERIZATION WITH CORONARY ANGIOGRAM;  Surgeon: Sanda Klein, MD;  Location: [facility] CATH LAB;  Service: Cardiovascular;   Laterality: N/A;    Allergies  Allergen Reactions  . Morphine Shortness Of Breath and Swelling  . Penicillins Shortness Of Breath and Swelling    Current Outpatient Prescriptions  Medication Sig Dispense Refill  . AMLODIPINE BESYLATE PO Take 5 mg by mouth daily.    Marland Kitchen aspirin 81 MG EC tablet Take 81 mg by mouth every morning.     . budesonide-formoterol (SYMBICORT) 160-4.5 MCG/ACT inhaler Inhale 2 puffs into the lungs 2 (two) times daily as needed (for shortness of breath).    . calcitRIOL (ROCALTROL) 0.25 MCG capsule Take 0.25 mcg by mouth daily. Take on Monday, Wednesday, and Friday.    . Cholecalciferol (VITAMIN D3) 2000 UNITS TABS Take by mouth daily.    . clobetasol ointment (TEMOVATE) 2.37 % Apply 1 application topically 2 (two) times daily.    . clopidogrel (PLAVIX) 75 MG tablet TAKE 1 TABLET ONCE DAILY.    Marland Kitchen ezetimibe (ZETIA) 10 MG tablet Take 10 mg by mouth every morning.     . famotidine (PEPCID) 20 MG tablet TAKE (1) TABLET TWICE DAILY.    . ferrous sulfate 325 (65 FE) MG tablet Take 325 mg by mouth daily.     Marland Kitchen  Fluticasone-Salmeterol (ADVAIR DISKUS) 250-50 MCG/DOSE AEPB Inhale 1 puff into the lungs 2 (two) times daily.     . furosemide (LASIX) 40 MG tablet Take 40 mg by mouth 2 (two) times daily.    Marland Kitchen HYDROcodone-acetaminophen (VICODIN) 5-500 MG per tablet Take 1 tablet by mouth 2 (two) times daily as needed for pain.     Marland Kitchen insulin aspart (NOVOLOG) 100 UNIT/ML injection Inject 45 Units into the skin 2 (two) times daily. 40 units in the morning and 40 units in the evening.    . insulin glargine (LANTUS) 100 UNIT/ML injection Inject 40 Units into the skin 2 (two) times daily.     . isosorbide mononitrate (IMDUR) 60 MG 24 hr tablet Take 60 mg daily    . levocetirizine (XYZAL) 5 MG tablet Take 5 mg by mouth every evening.    . Liraglutide (VICTOZA) 18 MG/3ML SOPN Inject 3 mLs into the skin daily. As directed    . lisinopril (PRINIVIL,ZESTRIL) 10 MG tablet Take 1 tablet (10 mg  total) by mouth daily.    . montelukast (SINGULAIR) 10 MG tablet Take 10 mg by mouth at bedtime.    . nebivolol (BYSTOLIC) 5 MG tablet Take 1 tab 2 times daily    . NITROSTAT 0.4 MG SL tablet PLACE ONE (1) TABLET UNDER TONGUE EVERY 5 MINUTES UP TO (3) DOSES AS NEEDED FOR CHEST PAIN.    Marland Kitchen NOVOLOG FLEXPEN 100 UNIT/ML FlexPen USE 25 UNITS AT BREAKFAST, 20 UNITS AT LUNCH AND 35 UNITS AT SUPPER SUB-Q. (Patient taking differently: 40 units in the morning)    . OXYGEN-HELIUM IN Inhale into the lungs. 2 liters continuous    . POLYETHYLENE GLYCOL 3350-GRX PO Take by mouth as needed.    . rosuvastatin (CRESTOR) 20 MG tablet Take 20 mg by mouth every evening.    Marland Kitchen spironolactone (ALDACTONE) 25 MG tablet Take 25 mg by mouth daily.    Marland Kitchen tiotropium (SPIRIVA) 18 MCG inhalation capsule Place 18 mcg into inhaler and inhale daily.    Marland Kitchen torsemide (DEMADEX) 20 MG tablet Take 1 tablet (20 mg total) by mouth 2 (two) times daily.  3  . Influenza Vac Typ A&B Surf Ant (FLUVIRIN) SUSP Inject into the muscle.    .      . loratadine (CLARITIN) 10 MG tablet Take 10 mg by mouth daily as needed for allergies.    Marland Kitchen nystatin-triamcinolone ointment (MYCOLOG) Apply 1 application topically 2 (two) times daily. To affected area.  prn  .       Review of Systems     Objective:   Physical Exam  Constitutional: She is oriented to person, place, and time. She appears well-nourished. No distress.  HENT:  Head: Normocephalic and atraumatic.  Mouth/Throat: Oropharynx is clear and moist. No oropharyngeal exudate.  RHINORRHEA  Eyes: Pupils are equal, round, and reactive to light. No scleral icterus.  Neck: Normal range of motion. Neck supple.  Cardiovascular: Normal rate, regular rhythm and normal heart sounds.   Pulmonary/Chest: Effort normal and breath sounds normal. No respiratory distress.  Abdominal: Soft. Bowel sounds are normal. She exhibits no distension. There is no tenderness.  Musculoskeletal: She exhibits no edema.    Lymphadenopathy:    She has no cervical adenopathy.  Neurological: She is alert and oriented to person, place, and time.  NO FOCAL DEFICITS   Psychiatric:  FLAT AFFECT, NL MOOD   Vitals reviewed.         Assessment & Plan:

## 2014-11-25 NOTE — Assessment & Plan Note (Addendum)
SX IMPROVED AVOIDING DAIRY FOR THE MOST PART. TCS UTD. HX: C DIFF COLITIS. ABOUT TO RECIVE ABX FOR URI. INTENTIONAL WEIGHT LOSS  NO INDICATION FOR ENDOSCOPY AT THIS TIME. ADD PROBIOTIC DAILY. AVOID DAIRY FOLLOW UP IN 4 MOS.

## 2014-11-25 NOTE — Progress Notes (Signed)
ON RECALL LIST  °

## 2014-11-25 NOTE — Progress Notes (Signed)
cc'ed to pcp °

## 2014-11-30 DIAGNOSIS — E11329 Type 2 diabetes mellitus with mild nonproliferative diabetic retinopathy without macular edema: Secondary | ICD-10-CM | POA: Diagnosis not present

## 2014-11-30 DIAGNOSIS — H25013 Cortical age-related cataract, bilateral: Secondary | ICD-10-CM | POA: Diagnosis not present

## 2014-11-30 DIAGNOSIS — H2513 Age-related nuclear cataract, bilateral: Secondary | ICD-10-CM | POA: Diagnosis not present

## 2014-11-30 DIAGNOSIS — H43813 Vitreous degeneration, bilateral: Secondary | ICD-10-CM | POA: Diagnosis not present

## 2014-12-07 DIAGNOSIS — M2012 Hallux valgus (acquired), left foot: Secondary | ICD-10-CM | POA: Diagnosis not present

## 2014-12-07 DIAGNOSIS — M2011 Hallux valgus (acquired), right foot: Secondary | ICD-10-CM | POA: Diagnosis not present

## 2014-12-07 DIAGNOSIS — I739 Peripheral vascular disease, unspecified: Secondary | ICD-10-CM | POA: Diagnosis not present

## 2014-12-07 DIAGNOSIS — E1040 Type 1 diabetes mellitus with diabetic neuropathy, unspecified: Secondary | ICD-10-CM | POA: Diagnosis not present

## 2014-12-07 DIAGNOSIS — L603 Nail dystrophy: Secondary | ICD-10-CM | POA: Diagnosis not present

## 2014-12-16 DIAGNOSIS — E559 Vitamin D deficiency, unspecified: Secondary | ICD-10-CM | POA: Diagnosis not present

## 2014-12-16 DIAGNOSIS — I10 Essential (primary) hypertension: Secondary | ICD-10-CM | POA: Diagnosis not present

## 2014-12-16 DIAGNOSIS — D649 Anemia, unspecified: Secondary | ICD-10-CM | POA: Diagnosis not present

## 2014-12-16 DIAGNOSIS — N183 Chronic kidney disease, stage 3 (moderate): Secondary | ICD-10-CM | POA: Diagnosis not present

## 2014-12-16 DIAGNOSIS — R809 Proteinuria, unspecified: Secondary | ICD-10-CM | POA: Diagnosis not present

## 2014-12-16 DIAGNOSIS — Z79899 Other long term (current) drug therapy: Secondary | ICD-10-CM | POA: Diagnosis not present

## 2014-12-22 DIAGNOSIS — E119 Type 2 diabetes mellitus without complications: Secondary | ICD-10-CM | POA: Diagnosis not present

## 2014-12-22 DIAGNOSIS — N183 Chronic kidney disease, stage 3 (moderate): Secondary | ICD-10-CM | POA: Diagnosis not present

## 2014-12-22 DIAGNOSIS — I5020 Unspecified systolic (congestive) heart failure: Secondary | ICD-10-CM | POA: Diagnosis not present

## 2014-12-28 DIAGNOSIS — N2581 Secondary hyperparathyroidism of renal origin: Secondary | ICD-10-CM | POA: Diagnosis not present

## 2014-12-28 DIAGNOSIS — N184 Chronic kidney disease, stage 4 (severe): Secondary | ICD-10-CM | POA: Diagnosis not present

## 2014-12-28 DIAGNOSIS — R809 Proteinuria, unspecified: Secondary | ICD-10-CM | POA: Diagnosis not present

## 2014-12-28 DIAGNOSIS — E559 Vitamin D deficiency, unspecified: Secondary | ICD-10-CM | POA: Diagnosis not present

## 2014-12-30 DIAGNOSIS — E1122 Type 2 diabetes mellitus with diabetic chronic kidney disease: Secondary | ICD-10-CM | POA: Diagnosis not present

## 2014-12-30 DIAGNOSIS — E782 Mixed hyperlipidemia: Secondary | ICD-10-CM | POA: Diagnosis not present

## 2014-12-30 DIAGNOSIS — E559 Vitamin D deficiency, unspecified: Secondary | ICD-10-CM | POA: Diagnosis not present

## 2014-12-30 DIAGNOSIS — I10 Essential (primary) hypertension: Secondary | ICD-10-CM | POA: Diagnosis not present

## 2014-12-30 DIAGNOSIS — Z713 Dietary counseling and surveillance: Secondary | ICD-10-CM | POA: Diagnosis not present

## 2014-12-30 DIAGNOSIS — E6609 Other obesity due to excess calories: Secondary | ICD-10-CM | POA: Diagnosis not present

## 2015-01-06 ENCOUNTER — Ambulatory Visit: Payer: Medicare Other | Admitting: Neurology

## 2015-01-06 DIAGNOSIS — I10 Essential (primary) hypertension: Secondary | ICD-10-CM | POA: Diagnosis not present

## 2015-01-06 DIAGNOSIS — E1122 Type 2 diabetes mellitus with diabetic chronic kidney disease: Secondary | ICD-10-CM | POA: Diagnosis not present

## 2015-01-06 DIAGNOSIS — E6609 Other obesity due to excess calories: Secondary | ICD-10-CM | POA: Diagnosis not present

## 2015-01-06 DIAGNOSIS — E782 Mixed hyperlipidemia: Secondary | ICD-10-CM | POA: Diagnosis not present

## 2015-01-11 ENCOUNTER — Encounter: Payer: Self-pay | Admitting: Cardiovascular Disease

## 2015-01-11 ENCOUNTER — Ambulatory Visit (INDEPENDENT_AMBULATORY_CARE_PROVIDER_SITE_OTHER): Payer: Medicare Other | Admitting: Cardiovascular Disease

## 2015-01-11 VITALS — BP 122/64 | HR 75 | Ht 67.0 in | Wt 201.0 lb

## 2015-01-11 DIAGNOSIS — E785 Hyperlipidemia, unspecified: Secondary | ICD-10-CM

## 2015-01-11 DIAGNOSIS — I5032 Chronic diastolic (congestive) heart failure: Secondary | ICD-10-CM | POA: Diagnosis not present

## 2015-01-11 DIAGNOSIS — G4733 Obstructive sleep apnea (adult) (pediatric): Secondary | ICD-10-CM | POA: Diagnosis not present

## 2015-01-11 DIAGNOSIS — J438 Other emphysema: Secondary | ICD-10-CM | POA: Diagnosis not present

## 2015-01-11 DIAGNOSIS — I10 Essential (primary) hypertension: Secondary | ICD-10-CM | POA: Diagnosis not present

## 2015-01-11 DIAGNOSIS — I2510 Atherosclerotic heart disease of native coronary artery without angina pectoris: Secondary | ICD-10-CM

## 2015-01-11 DIAGNOSIS — Z9989 Dependence on other enabling machines and devices: Secondary | ICD-10-CM

## 2015-01-11 NOTE — Patient Instructions (Addendum)
Your physician wants you to follow-up in: 6 mons You will receive a reminder letter in the mail two months in advance. If you don't receive a letter, please call our office to schedule the follow-up appointment.    STOP Lasix    You have been referred to Dr Sinda Du for CPAP titration     Thank you for choosing [facility] !

## 2015-01-11 NOTE — Progress Notes (Signed)
Patient ID: Krystal [name], female   DOB: February 28, 1936, 79 y.o.   MRN: [id number]      SUBJECTIVE: The patient presents for routine cardiovascular followup. In summary, she has a history of chronic diastolic heart failure, hypertension, coronary artery disease with multiple previously placed stents, COPD and is on chronic oxygen at home (3L), sleep apnea and uses CPAP, hyperlipidemia, and insulin-dependent diabetes mellitus. Echocardiogram on 03/05/2014 demonstrated vigorous left ventricular systolic function, EF 25-42%, mild LVH, grade 1 diastolic dysfunction, elevated filling pressures, mild mitral regurgitation, trivial tricuspid regurgitation, and mildly elevated pulmonary pressures of 37 mmHg.   As per Dr. Evette Georges note on 02/14/14, she has coronary artery disease dating back to the 1990s and has undergone multiple stent procedures involving the proximal LAD, circumflex, mid right coronary artery, and distal right coronary artery. Her last catheterization in October 2013 did not reveal high-grade restenosis. She did have 80-90% ostial stenosis and a small jailed first septal perforating artery. She had mild calcification and 20-30% narrowing in the stent in the circumflex vessel. The right coronary artery had 3 stents and there was 40-50% in-stent smooth narrowing with 30% narrowing in the distal stent.   She has had only 3 episodes of chest pain in the past 6 months, each alleviated with one sublingual nitroglycerin. She has had difficulty sleeping and does use CPAP for obstructive sleep apnea. It appears she has been taking both furosemide 40 mg twice daily as well as torsemide 20 mg twice daily.   Review of Systems: As per "subjective", otherwise negative.  Allergies  Allergen Reactions  . Morphine Shortness Of Breath and Swelling  . Penicillins Shortness Of Breath and Swelling    Current Outpatient Prescriptions  Medication Sig Dispense Refill  . AMLODIPINE BESYLATE PO Take 5 mg by mouth  daily.    Marland Kitchen aspirin 81 MG EC tablet Take 81 mg by mouth every morning.     . budesonide-formoterol (SYMBICORT) 160-4.5 MCG/ACT inhaler Inhale 2 puffs into the lungs 2 (two) times daily as needed (for shortness of breath).    . calcitRIOL (ROCALTROL) 0.25 MCG capsule Take 0.25 mcg by mouth daily. Frequency change. Now taking everyday.    . Cholecalciferol (VITAMIN D3) 2000 UNITS TABS Take by mouth daily.    . clobetasol ointment (TEMOVATE) 7.06 % Apply 1 application topically 2 (two) times daily.    . clopidogrel (PLAVIX) 75 MG tablet TAKE 1 TABLET ONCE DAILY. 30 tablet 6  . ezetimibe (ZETIA) 10 MG tablet Take 10 mg by mouth every morning.     . famotidine (PEPCID) 20 MG tablet TAKE (1) TABLET TWICE DAILY. 60 tablet 3  . ferrous sulfate 325 (65 FE) MG tablet Take 325 mg by mouth daily.     . Fluticasone-Salmeterol (ADVAIR DISKUS) 250-50 MCG/DOSE AEPB Inhale 1 puff into the lungs 2 (two) times daily.     . furosemide (LASIX) 40 MG tablet Take 40 mg by mouth 2 (two) times daily.    Marland Kitchen HYDROcodone-acetaminophen (VICODIN) 5-500 MG per tablet Take 1 tablet by mouth 2 (two) times daily as needed for pain.     Marland Kitchen insulin aspart (NOVOLOG) 100 UNIT/ML injection Inject 45 Units into the skin 2 (two) times daily. 40 units in the morning and 40 units in the evening.    . insulin glargine (LANTUS) 100 UNIT/ML injection Inject 40 Units into the skin 2 (two) times daily.     . isosorbide mononitrate (IMDUR) 60 MG 24 hr tablet Take 60 mg daily 60  tablet 3  . levocetirizine (XYZAL) 5 MG tablet Take 5 mg by mouth every evening.    . Liraglutide (VICTOZA) 18 MG/3ML SOPN Inject 3 mLs into the skin daily. As directed    . lisinopril (PRINIVIL,ZESTRIL) 10 MG tablet Take 1 tablet (10 mg total) by mouth daily. 90 tablet 3  . montelukast (SINGULAIR) 10 MG tablet Take 10 mg by mouth at bedtime.    . nebivolol (BYSTOLIC) 5 MG tablet Take 1 tab 2 times daily 60 tablet 3  . NITROSTAT 0.4 MG SL tablet PLACE ONE (1) TABLET UNDER  TONGUE EVERY 5 MINUTES UP TO (3) DOSES AS NEEDED FOR CHEST PAIN. 25 tablet 1  . NOVOLOG FLEXPEN 100 UNIT/ML FlexPen USE 25 UNITS AT BREAKFAST, 20 UNITS AT LUNCH AND 35 UNITS AT SUPPER SUB-Q. (Patient taking differently: 40 units in the morning) 30 mL 2  . nystatin-triamcinolone ointment (MYCOLOG) Apply 1 application topically 2 (two) times daily. To affected area. 30 g prn  . OXYGEN-HELIUM IN Inhale into the lungs. 2 liters continuous    . POLYETHYLENE GLYCOL 3350-GRX PO Take by mouth as needed.    . rosuvastatin (CRESTOR) 20 MG tablet Take 20 mg by mouth every evening.    Marland Kitchen spironolactone (ALDACTONE) 25 MG tablet Take 25 mg by mouth daily.    Marland Kitchen tiotropium (SPIRIVA) 18 MCG inhalation capsule Place 18 mcg into inhaler and inhale daily.    Marland Kitchen torsemide (DEMADEX) 20 MG tablet Take 1 tablet (20 mg total) by mouth 2 (two) times daily. 180 tablet 3   No current facility-administered medications for this visit.    Past Medical History  Diagnosis Date  . Sleep apnea     on C-pap  . Occult blood in stools   . Myalgia   . Abnormal PFT   . DM (diabetes mellitus)     type II uncontrolled  . Palpitations   . Microalbuminuria   . Microcytic anemia   . DJD (degenerative joint disease)   . Constipation   . CAD (coronary artery disease) 2009    5 stents- #3 in RCA, #1 each in LAD and AVG  . Osteoarthritis   . Low back pain   . HTN (hypertension)   . Hyperlipidemia   . GERD (gastroesophageal reflux disease)   . Allergic rhinitis   . Personal history of colonic polyps   . HOH (hard of hearing)   . MI (myocardial infarction) 66  . Glaucoma   . Diastolic dysfunction, left ventricle 09/12/12  . Cancer 04/2010       . Acute renal failure due to procedure 09/18/2012  . CKD (chronic kidney disease) stage 3, GFR 30-59 ml/min 09/18/2012  . Heart block 02/2013    transient  . OSA on CPAP   . On home O2     2L N/C continuously  . Emphysema lung   . Complete heart block, transiet 01/26/2013     Past Surgical History  Procedure Laterality Date  . Ectopic pregnancy surgery    . Foot surgery      left and right for callous  . Cervical discectomy      L5 left/hemilminectomy  . Colonoscopy  09/2006    int hemmorhoids, COMPLICATED BY CARDIOPULMONARY COMPLICATIONS  . Abdominal hysterectomy  04/2010    uterine cancer,TAHBSO  . Polypectomy  10/22/2011    internal hemorrhoids/sessile polyp  . Coronary angioplasty  1/99, 1/07, 1/08, 4/09    5 cardiac stents total  . Left and right heart catheterization  with coronary angiogram N/A 09/12/2012    Procedure: LEFT AND RIGHT HEART CATHETERIZATION WITH CORONARY ANGIOGRAM;  Surgeon: Sanda Klein, MD;  Location: [facility] CATH LAB;  Service: Cardiovascular;  Laterality: N/A;    History   Social History  . Marital Status: Widowed    Spouse Name: N/A  . Number of Children: 0  . Years of Education: 12   Occupational History  .      retired   Social History Main Topics  . Smoking status: Former Smoker -- 0.40 packs/day for 51 years    Types: Cigarettes    Start date: 04/22/1955    Quit date: 10/18/2006  . Smokeless tobacco: Never Used     Comment: quit about 4 yrs ago  . Alcohol Use: No  . Drug Use: No  . Sexual Activity: No   Other Topics Concern  . Not on file   Social History Narrative   Consumes 2 cups of caffeine daily     Filed Vitals:   01/11/15 1245  BP: 122/64  Pulse: 75  Height: 5\' 7"  (1.702 m)  Weight: 201 lb (91.173 kg)  SpO2: 97%    PHYSICAL EXAM General: NAD. Neck: No JVD, no thyromegaly.  Lungs: Diminished but clear b/l. No rales or wheezes appreciated.  CV: Nondisplaced PMI. Regular rate and rhythm, normal S1/S2, no S3/S4, no murmur. No pretibial or periankle edema.  Abdomen: Soft, nontender, no hepatosplenomegaly, no distention.  Neurologic: Alert and oriented x 3.  Psych: Normal affect.  Skin: Normal. Musculoskeletal: Normal range of motion, no gross deformities. Extremities: No clubbing  or cyanosis.   ECG: Most recent ECG reviewed.      ASSESSMENT AND PLAN: 1. Chronic diastolic heart failure: She is currently euvolemic and stable. I will d/c furosemide as this did not work as well in the past and only continue torsemide 20 mg bid.   2. CAD: Stable ischemic heart disease. She underwent a low risk, normal nuclear stress test in September 2014. Continue current therapy with aspirin, Plavix, Zetia, Imdur, Bystolic, and Crestor.  3. Essential hypertension: Well controlled on current therapy. No changes.  4. Hyperlipidemia: Continue current therapy with Crestor and Zetia.   5. CKD: In 12/15, BUN 44, creat 2.33.  6. COPD: Chronic dyspnea with exertion appears to be stable on 3L oxygen.  7. Obstructive sleep apnea: I will have her referred for a CPAP titration study given her sleep dysfunction.  Dispo: f/u 6 months.   Kate Sable, M.D., F.A.C.C.

## 2015-01-14 DIAGNOSIS — I10 Essential (primary) hypertension: Secondary | ICD-10-CM | POA: Diagnosis not present

## 2015-01-14 DIAGNOSIS — E782 Mixed hyperlipidemia: Secondary | ICD-10-CM | POA: Diagnosis not present

## 2015-01-14 DIAGNOSIS — E1122 Type 2 diabetes mellitus with diabetic chronic kidney disease: Secondary | ICD-10-CM | POA: Diagnosis not present

## 2015-01-14 DIAGNOSIS — E6609 Other obesity due to excess calories: Secondary | ICD-10-CM | POA: Diagnosis not present

## 2015-01-20 DIAGNOSIS — I10 Essential (primary) hypertension: Secondary | ICD-10-CM | POA: Diagnosis not present

## 2015-01-20 DIAGNOSIS — E782 Mixed hyperlipidemia: Secondary | ICD-10-CM | POA: Diagnosis not present

## 2015-01-20 DIAGNOSIS — E6609 Other obesity due to excess calories: Secondary | ICD-10-CM | POA: Diagnosis not present

## 2015-01-20 DIAGNOSIS — E1122 Type 2 diabetes mellitus with diabetic chronic kidney disease: Secondary | ICD-10-CM | POA: Diagnosis not present

## 2015-01-24 ENCOUNTER — Other Ambulatory Visit: Payer: Self-pay | Admitting: Cardiovascular Disease

## 2015-01-25 DIAGNOSIS — M10071 Idiopathic gout, right ankle and foot: Secondary | ICD-10-CM | POA: Diagnosis not present

## 2015-01-25 DIAGNOSIS — M7989 Other specified soft tissue disorders: Secondary | ICD-10-CM | POA: Diagnosis not present

## 2015-01-25 DIAGNOSIS — M12271 Villonodular synovitis (pigmented), right ankle and foot: Secondary | ICD-10-CM | POA: Diagnosis not present

## 2015-01-25 DIAGNOSIS — M792 Neuralgia and neuritis, unspecified: Secondary | ICD-10-CM | POA: Diagnosis not present

## 2015-01-25 DIAGNOSIS — M12272 Villonodular synovitis (pigmented), left ankle and foot: Secondary | ICD-10-CM | POA: Diagnosis not present

## 2015-01-25 DIAGNOSIS — M10072 Idiopathic gout, left ankle and foot: Secondary | ICD-10-CM | POA: Diagnosis not present

## 2015-02-01 DIAGNOSIS — M19079 Primary osteoarthritis, unspecified ankle and foot: Secondary | ICD-10-CM | POA: Diagnosis not present

## 2015-02-01 DIAGNOSIS — M10072 Idiopathic gout, left ankle and foot: Secondary | ICD-10-CM | POA: Diagnosis not present

## 2015-02-01 DIAGNOSIS — M10071 Idiopathic gout, right ankle and foot: Secondary | ICD-10-CM | POA: Diagnosis not present

## 2015-02-08 DIAGNOSIS — B351 Tinea unguium: Secondary | ICD-10-CM | POA: Diagnosis not present

## 2015-02-08 DIAGNOSIS — M10072 Idiopathic gout, left ankle and foot: Secondary | ICD-10-CM | POA: Diagnosis not present

## 2015-02-08 DIAGNOSIS — M79609 Pain in unspecified limb: Secondary | ICD-10-CM | POA: Diagnosis not present

## 2015-02-08 DIAGNOSIS — M10071 Idiopathic gout, right ankle and foot: Secondary | ICD-10-CM | POA: Diagnosis not present

## 2015-02-10 DIAGNOSIS — I10 Essential (primary) hypertension: Secondary | ICD-10-CM | POA: Diagnosis not present

## 2015-02-10 DIAGNOSIS — E1165 Type 2 diabetes mellitus with hyperglycemia: Secondary | ICD-10-CM | POA: Diagnosis not present

## 2015-02-10 DIAGNOSIS — G4730 Sleep apnea, unspecified: Secondary | ICD-10-CM | POA: Diagnosis not present

## 2015-02-10 DIAGNOSIS — J449 Chronic obstructive pulmonary disease, unspecified: Secondary | ICD-10-CM | POA: Diagnosis not present

## 2015-02-15 ENCOUNTER — Other Ambulatory Visit (HOSPITAL_COMMUNITY): Payer: Self-pay | Admitting: Radiology

## 2015-02-15 DIAGNOSIS — G4730 Sleep apnea, unspecified: Secondary | ICD-10-CM

## 2015-02-16 ENCOUNTER — Encounter: Payer: Medicare Other | Admitting: Nutrition

## 2015-03-02 DIAGNOSIS — N390 Urinary tract infection, site not specified: Secondary | ICD-10-CM | POA: Diagnosis not present

## 2015-03-02 DIAGNOSIS — Z0000 Encounter for general adult medical examination without abnormal findings: Secondary | ICD-10-CM | POA: Diagnosis not present

## 2015-03-04 ENCOUNTER — Other Ambulatory Visit: Payer: Self-pay | Admitting: Cardiovascular Disease

## 2015-03-16 ENCOUNTER — Encounter: Payer: Self-pay | Admitting: Gastroenterology

## 2015-03-22 ENCOUNTER — Ambulatory Visit: Payer: Medicare Other | Admitting: Neurology

## 2015-03-22 ENCOUNTER — Telehealth: Payer: Self-pay

## 2015-03-22 NOTE — Telephone Encounter (Signed)
Patient cancelled appt without 24hr notice.

## 2015-03-28 ENCOUNTER — Ambulatory Visit (HOSPITAL_COMMUNITY)
Admission: RE | Admit: 2015-03-28 | Discharge: 2015-03-28 | Disposition: A | Discharge status: Home / Self Care | Payer: Medicare Other | Source: Ambulatory Visit | Attending: Pulmonary Disease | Admitting: Pulmonary Disease

## 2015-03-28 ENCOUNTER — Other Ambulatory Visit (HOSPITAL_COMMUNITY): Payer: Self-pay | Admitting: Pulmonary Disease

## 2015-03-28 DIAGNOSIS — E119 Type 2 diabetes mellitus without complications: Secondary | ICD-10-CM | POA: Insufficient documentation

## 2015-03-28 DIAGNOSIS — I2510 Atherosclerotic heart disease of native coronary artery without angina pectoris: Secondary | ICD-10-CM | POA: Insufficient documentation

## 2015-03-28 DIAGNOSIS — R0602 Shortness of breath: Secondary | ICD-10-CM | POA: Insufficient documentation

## 2015-03-28 DIAGNOSIS — I129 Hypertensive chronic kidney disease with stage 1 through stage 4 chronic kidney disease, or unspecified chronic kidney disease: Secondary | ICD-10-CM | POA: Insufficient documentation

## 2015-03-28 DIAGNOSIS — N189 Chronic kidney disease, unspecified: Secondary | ICD-10-CM | POA: Diagnosis not present

## 2015-03-28 DIAGNOSIS — Z794 Long term (current) use of insulin: Secondary | ICD-10-CM | POA: Diagnosis not present

## 2015-03-28 DIAGNOSIS — J449 Chronic obstructive pulmonary disease, unspecified: Secondary | ICD-10-CM | POA: Diagnosis not present

## 2015-03-28 DIAGNOSIS — M10079 Idiopathic gout, unspecified ankle and foot: Secondary | ICD-10-CM | POA: Diagnosis not present

## 2015-03-28 DIAGNOSIS — M79602 Pain in left arm: Secondary | ICD-10-CM | POA: Diagnosis not present

## 2015-03-28 DIAGNOSIS — Z87891 Personal history of nicotine dependence: Secondary | ICD-10-CM | POA: Insufficient documentation

## 2015-03-28 DIAGNOSIS — G4730 Sleep apnea, unspecified: Secondary | ICD-10-CM | POA: Diagnosis not present

## 2015-03-28 DIAGNOSIS — R079 Chest pain, unspecified: Secondary | ICD-10-CM | POA: Diagnosis not present

## 2015-03-28 DIAGNOSIS — I10 Essential (primary) hypertension: Secondary | ICD-10-CM | POA: Diagnosis not present

## 2015-03-29 ENCOUNTER — Ambulatory Visit: Payer: Medicare Other | Attending: Pulmonary Disease | Admitting: Sleep Medicine

## 2015-03-29 DIAGNOSIS — G4730 Sleep apnea, unspecified: Secondary | ICD-10-CM | POA: Insufficient documentation

## 2015-04-01 DIAGNOSIS — Z79899 Other long term (current) drug therapy: Secondary | ICD-10-CM | POA: Diagnosis not present

## 2015-04-01 DIAGNOSIS — R809 Proteinuria, unspecified: Secondary | ICD-10-CM | POA: Diagnosis not present

## 2015-04-01 DIAGNOSIS — E559 Vitamin D deficiency, unspecified: Secondary | ICD-10-CM | POA: Diagnosis not present

## 2015-04-01 DIAGNOSIS — D649 Anemia, unspecified: Secondary | ICD-10-CM | POA: Diagnosis not present

## 2015-04-01 DIAGNOSIS — I10 Essential (primary) hypertension: Secondary | ICD-10-CM | POA: Diagnosis not present

## 2015-04-01 DIAGNOSIS — N183 Chronic kidney disease, stage 3 (moderate): Secondary | ICD-10-CM | POA: Diagnosis not present

## 2015-04-02 NOTE — Sleep Study (Signed)
Baring A. Merlene Laughter, MD     [email or web address]        NOCTURNAL POLYSOMNOGRAM    LOCATION: SLEEP LAB FACILITY: [facility]   PHYSICIAN: Tarvis Blossom A. Merlene Laughter, M.D.   DATE OF STUDY: 03/29/2015.   REFERRING PHYSICIAN: Sinda Du.   INDICATIONS: The patient is an 79 year old who has a known history of obstructive sleep apnea syndrome. He presents with daytime fatigue and snoring.  MEDICATIONS:  Prior to Admission medications   Medication Sig Start Date End Date Taking? Authorizing Provider  AMLODIPINE BESYLATE PO Take 5 mg by mouth daily.    Historical Provider, MD  aspirin 81 MG EC tablet Take 81 mg by mouth every morning.     Historical Provider, MD  budesonide-formoterol (SYMBICORT) 160-4.5 MCG/ACT inhaler Inhale 2 puffs into the lungs 2 (two) times daily as needed (for shortness of breath).    Historical Provider, MD  calcitRIOL (ROCALTROL) 0.25 MCG capsule Take 0.25 mcg by mouth daily. Frequency change. Now taking everyday. 06/12/13   Historical Provider, MD  Cholecalciferol (VITAMIN D3) 2000 UNITS TABS Take by mouth daily.    Historical Provider, MD  clobetasol ointment (TEMOVATE) 4.12 % Apply 1 application topically 2 (two) times daily.    Historical Provider, MD  clopidogrel (PLAVIX) 75 MG tablet TAKE 1 TABLET ONCE DAILY. 08/27/14   [name], MD  ezetimibe (ZETIA) 10 MG tablet Take 10 mg by mouth every morning.     Historical Provider, MD  famotidine (PEPCID) 20 MG tablet TAKE (1) TABLET TWICE DAILY. 08/06/14   [name], MD  ferrous sulfate 325 (65 FE) MG tablet Take 325 mg by mouth daily.     Historical Provider, MD  Fluticasone-Salmeterol (ADVAIR DISKUS) 250-50 MCG/DOSE AEPB Inhale 1 puff into the lungs 2 (two) times daily.     Sinda Du, MD  HYDROcodone-acetaminophen (VICODIN) 5-500 MG per tablet Take 1 tablet by mouth 2 (two) times daily as needed for pain.  07/10/13   Historical Provider, MD  insulin aspart (NOVOLOG) 100 UNIT/ML  injection Inject 45 Units into the skin 2 (two) times daily. 40 units in the morning and 40 units in the evening.    Historical Provider, MD  insulin glargine (LANTUS) 100 UNIT/ML injection Inject 40 Units into the skin 2 (two) times daily.     Historical Provider, MD  isosorbide mononitrate (IMDUR) 60 MG 24 hr tablet Take 60 mg daily 07/19/14   [name], MD  isosorbide mononitrate (IMDUR) 60 MG 24 hr tablet TAKE 1&1/2 TABLETS BY MOUTH EVERY MORNING AND 1/2 TABLET IN THE EVENING. 01/24/15   [name], MD  levocetirizine (XYZAL) 5 MG tablet Take 5 mg by mouth every evening.    Historical Provider, MD  Liraglutide (VICTOZA) 18 MG/3ML SOPN Inject 3 mLs into the skin daily. As directed    Historical Provider, MD  lisinopril (PRINIVIL,ZESTRIL) 10 MG tablet Take 1 tablet (10 mg total) by mouth daily. 07/19/14   [name], MD  montelukast (SINGULAIR) 10 MG tablet Take 10 mg by mouth at bedtime.    Historical Provider, MD  nebivolol (BYSTOLIC) 5 MG tablet Take 1 tab 2 times daily 10/28/14   [name], MD  NITROSTAT 0.4 MG SL tablet PLACE ONE (1) TABLET UNDER TONGUE EVERY 5 MINUTES UP TO (3) DOSES AS NEEDED FOR CHEST PAIN. 03/04/15   Troy Sine, MD  NOVOLOG FLEXPEN 100 UNIT/ML FlexPen USE 25 UNITS AT BREAKFAST, 20 UNITS AT LUNCH AND 35 UNITS  AT SUPPER SUB-Q. Patient taking differently: 40 units in the morning 04/28/14   Elayne Snare, MD  nystatin-triamcinolone ointment [facility]) Apply 1 application topically 2 (two) times daily. To affected area. 01/18/14   Jonnie Kind, MD  OXYGEN-HELIUM IN Inhale into the lungs. 2 liters continuous    Historical Provider, MD  POLYETHYLENE GLYCOL 3350-GRX PO Take by mouth as needed.    Historical Provider, MD  rosuvastatin (CRESTOR) 20 MG tablet Take 20 mg by mouth every evening.    Historical Provider, MD  spironolactone (ALDACTONE) 25 MG tablet Take 25 mg by mouth daily.    Historical Provider, MD  tiotropium (SPIRIVA) 18 MCG  inhalation capsule Place 18 mcg into inhaler and inhale daily.    Historical Provider, MD  torsemide (DEMADEX) 20 MG tablet Take 1 tablet (20 mg total) by mouth 2 (two) times daily. 04/07/14   [name], MD      EPWORTH SLEEPINESS SCALE: 5.   BMI: 31.   ARCHITECTURAL SUMMARY: Total recording time was 379 minutes. Sleep efficiency 69 %. Sleep latency 46 minutes. REM latency 235 minutes. Stage NI 19 %, N2 69 % and N3 0 % and REM sleep 12 %.    RESPIRATORY DATA:  The study is conducted with 2 L continuous nasal oxygen per order of the referring physician. Baseline oxygen saturation is 98 %. The lowest saturation is 95 %. The diagnostic AHI is 0. The RDI is 6. The REM AHI is 0.  LIMB MOVEMENT SUMMARY: PLM index 0.   ELECTROCARDIOGRAM SUMMARY: Average heart rate is 75 with no significant dysrhythmias observed.   IMPRESSION:  1. Mild obstructive sleep apnea syndrome not requiring positive pressure treatment.  Thanks for this referral.  Chanta Bauers A. Merlene Laughter, M.D. Diplomat, Tax adviser of Sleep Medicine.

## 2015-04-05 DIAGNOSIS — E118 Type 2 diabetes mellitus with unspecified complications: Secondary | ICD-10-CM | POA: Diagnosis not present

## 2015-04-05 DIAGNOSIS — R5383 Other fatigue: Secondary | ICD-10-CM | POA: Diagnosis not present

## 2015-04-07 ENCOUNTER — Telehealth: Payer: Self-pay | Admitting: General Practice

## 2015-04-07 ENCOUNTER — Other Ambulatory Visit: Payer: Self-pay | Admitting: Endocrinology

## 2015-04-07 MED ORDER — LIDOCAINE-HYDROCORTISONE ACE 3-2.5 % RE KIT: PACK | RECTAL | Status: DC

## 2015-04-07 NOTE — Addendum Note (Signed)
Addended by: Danie Binder on: 04/07/2015 03:55 PM   Modules accepted: Orders

## 2015-04-07 NOTE — Telephone Encounter (Signed)
Patient called in stating that her "hemorrhoids are hanging and out".  I explained to the patient that the first appointment with [facility] will be the end of June.  She stated she can't wait that long and would like for [facility] to give her a call.  Routing to [facility]

## 2015-04-08 ENCOUNTER — Ambulatory Visit (INDEPENDENT_AMBULATORY_CARE_PROVIDER_SITE_OTHER): Payer: Medicare Other | Admitting: Obstetrics and Gynecology

## 2015-04-08 ENCOUNTER — Encounter: Payer: Self-pay | Admitting: Obstetrics and Gynecology

## 2015-04-08 VITALS — BP 136/80 | Ht 67.0 in | Wt 197.0 lb

## 2015-04-08 DIAGNOSIS — N949 Unspecified condition associated with female genital organs and menstrual cycle: Secondary | ICD-10-CM | POA: Diagnosis not present

## 2015-04-08 DIAGNOSIS — N898 Other specified noninflammatory disorders of vagina: Secondary | ICD-10-CM | POA: Diagnosis not present

## 2015-04-08 DIAGNOSIS — Z1389 Encounter for screening for other disorder: Secondary | ICD-10-CM | POA: Diagnosis not present

## 2015-04-08 LAB — POCT URINALYSIS DIPSTICK
Blood, UA: 2
Glucose, UA: NEGATIVE
Ketones, UA: NEGATIVE
Leukocytes, UA: NEGATIVE
Nitrite, UA: NEGATIVE
Protein, UA: NEGATIVE

## 2015-04-08 MED ORDER — FLUCONAZOLE 150 MG PO TABS: 150.0000 mg | ORAL_TABLET | Freq: Once | ORAL | Status: DC

## 2015-04-08 MED ORDER — MICONAZOLE NITRATE 2 % EX CREA: 1.0000 "application " | TOPICAL_CREAM | Freq: Two times a day (BID) | CUTANEOUS | Status: DC

## 2015-04-08 NOTE — Telephone Encounter (Signed)
CALLED PT MAY 19. ORDERED HEMORRHOID CREAM. OPV IN 2-3 WEEKS E30 RECTAL PAIN.

## 2015-04-08 NOTE — Progress Notes (Signed)
Patient ID: Krystal [name], female   DOB: 06-01-1936, 80 y.o.   MRN: [id number] Pt here today for vaginal irritation, and burning. Pt states that she has had this issue for about 2 months.    [facility] Visit  Patient name: Krystal [name] MRN [id number]  Date of birth: 1936-10-23  CC & HPI:  Krystal [name] is a 79 y.o. female presenting today for vag d/c  ROS:  alson lengthy discussion of pt's conflict over arousal and self-stimulation   Pertinent History Reviewed:   Reviewed: Significant for  Medical         Past Medical History  Diagnosis Date  . Sleep apnea     on C-pap  . Occult blood in stools   . Myalgia   . Abnormal PFT   . DM (diabetes mellitus)     type II uncontrolled  . Palpitations   . Microalbuminuria   . Microcytic anemia   . DJD (degenerative joint disease)   . Constipation   . CAD (coronary artery disease) 2009    5 stents- #3 in RCA, #1 each in LAD and AVG  . Osteoarthritis   . Low back pain   . HTN (hypertension)   . Hyperlipidemia   . GERD (gastroesophageal reflux disease)   . Allergic rhinitis   . Personal history of colonic polyps   . HOH (hard of hearing)   . MI (myocardial infarction) 93  . Glaucoma   . Diastolic dysfunction, left ventricle 09/12/12  . Cancer 04/2010       . Acute renal failure due to procedure 09/18/2012  . CKD (chronic kidney disease) stage 3, GFR 30-59 ml/min 09/18/2012  . Heart block 02/2013    transient  . OSA on CPAP   . On home O2     2L N/C continuously  . Emphysema lung   . Complete heart block, transiet 01/26/2013  . Gout                               Surgical Hx:    Past Surgical History  Procedure Laterality Date  . Ectopic pregnancy surgery    . Foot surgery      left and right for callous  . Cervical discectomy      L5 left/hemilminectomy  . Colonoscopy  09/2006    int hemmorhoids, COMPLICATED BY CARDIOPULMONARY COMPLICATIONS  . Abdominal hysterectomy  04/2010    uterine cancer,TAHBSO   . Polypectomy  10/22/2011    internal hemorrhoids/sessile polyp  . Coronary angioplasty  1/99, 1/07, 1/08, 4/09    5 cardiac stents total  . Left and right heart catheterization with coronary angiogram N/A 09/12/2012    Procedure: LEFT AND RIGHT HEART CATHETERIZATION WITH CORONARY ANGIOGRAM;  Surgeon: Sanda Klein, MD;  Location: [facility] CATH LAB;  Service: Cardiovascular;  Laterality: N/A;   Medications: Reviewed & Updated - see associated section                       Current outpatient prescriptions:  .  AMLODIPINE BESYLATE PO, Take 5 mg by mouth daily., Disp: , Rfl:  .  aspirin 81 MG EC tablet, Take 81 mg by mouth every morning. , Disp: , Rfl:  .  budesonide-formoterol (SYMBICORT) 160-4.5 MCG/ACT inhaler, Inhale 2 puffs into the lungs 2 (two) times daily as needed (for shortness of breath)., Disp: , Rfl:  .  calcitRIOL (ROCALTROL)  0.25 MCG capsule, Take 0.25 mcg by mouth daily. Frequency change. Now taking everyday., Disp: , Rfl:  .  Cholecalciferol (VITAMIN D3) 2000 UNITS TABS, Take by mouth daily., Disp: , Rfl:  .  clobetasol ointment (TEMOVATE) 5.00 %, Apply 1 application topically 2 (two) times daily., Disp: , Rfl:  .  clopidogrel (PLAVIX) 75 MG tablet, TAKE 1 TABLET ONCE DAILY., Disp: 30 tablet, Rfl: 6 .  ezetimibe (ZETIA) 10 MG tablet, Take 10 mg by mouth every morning. , Disp: , Rfl:  .  famotidine (PEPCID) 20 MG tablet, TAKE (1) TABLET TWICE DAILY., Disp: 60 tablet, Rfl: 3 .  ferrous sulfate 325 (65 FE) MG tablet, Take 325 mg by mouth daily. , Disp: , Rfl:  .  Fluticasone-Salmeterol (ADVAIR DISKUS) 250-50 MCG/DOSE AEPB, Inhale 1 puff into the lungs 2 (two) times daily. , Disp: , Rfl:  .  HYDROcodone-acetaminophen (VICODIN) 5-500 MG per tablet, Take 1 tablet by mouth 2 (two) times daily as needed for pain. , Disp: , Rfl:  .  insulin aspart (NOVOLOG) 100 UNIT/ML injection, Inject 45 Units into the skin 2 (two) times daily. 40 units in the morning and 40 units in the evening., Disp: ,  Rfl:  .  insulin glargine (LANTUS) 100 UNIT/ML injection, Inject 40 Units into the skin 2 (two) times daily. , Disp: , Rfl:  .  isosorbide mononitrate (IMDUR) 60 MG 24 hr tablet, Take 60 mg daily, Disp: 60 tablet, Rfl: 3 .  isosorbide mononitrate (IMDUR) 60 MG 24 hr tablet, TAKE 1&1/2 TABLETS BY MOUTH EVERY MORNING AND 1/2 TABLET IN THE EVENING., Disp: 60 tablet, Rfl: 6 .  levocetirizine (XYZAL) 5 MG tablet, Take 5 mg by mouth every evening., Disp: , Rfl:  .  Lidocaine-Hydrocortisone Ace 3-2.5 % KIT, APPLY TO RECTUM QID FOR 2 WEEKS, Disp: 1 each, Rfl: 0 .  Liraglutide (VICTOZA) 18 MG/3ML SOPN, Inject 3 mLs into the skin daily. As directed, Disp: , Rfl:  .  lisinopril (PRINIVIL,ZESTRIL) 10 MG tablet, Take 1 tablet (10 mg total) by mouth daily., Disp: 90 tablet, Rfl: 3 .  montelukast (SINGULAIR) 10 MG tablet, Take 10 mg by mouth at bedtime., Disp: , Rfl:  .  nebivolol (BYSTOLIC) 5 MG tablet, Take 1 tab 2 times daily, Disp: 60 tablet, Rfl: 3 .  NITROSTAT 0.4 MG SL tablet, PLACE ONE (1) TABLET UNDER TONGUE EVERY 5 MINUTES UP TO (3) DOSES AS NEEDED FOR CHEST PAIN., Disp: 25 tablet, Rfl: 0 .  NOVOLOG FLEXPEN 100 UNIT/ML FlexPen, USE 25 UNITS AT BREAKFAST, 20 UNITS AT LUNCH AND 35 UNITS AT SUPPER SUB-Q. (Patient taking differently: 40 units in the morning), Disp: 30 mL, Rfl: 2 .  nystatin-triamcinolone ointment (MYCOLOG), Apply 1 application topically 2 (two) times daily. To affected area., Disp: 30 g, Rfl: prn .  OXYGEN-HELIUM IN, Inhale into the lungs. 2 liters continuous, Disp: , Rfl:  .  POLYETHYLENE GLYCOL 3350-GRX PO, Take by mouth as needed., Disp: , Rfl:  .  rosuvastatin (CRESTOR) 20 MG tablet, Take 20 mg by mouth every evening., Disp: , Rfl:  .  spironolactone (ALDACTONE) 25 MG tablet, Take 25 mg by mouth daily., Disp: , Rfl:  .  tiotropium (SPIRIVA) 18 MCG inhalation capsule, Place 18 mcg into inhaler and inhale daily., Disp: , Rfl:  .  torsemide (DEMADEX) 20 MG tablet, Take 1 tablet (20 mg  total) by mouth 2 (two) times daily., Disp: 180 tablet, Rfl: 3   Social History: Reviewed -  reports that she quit  smoking about 8 years ago. Her smoking use included Cigarettes. She started smoking about 60 years ago. She has a 20.4 pack-year smoking history. She has never used smokeless tobacco.  Objective Findings:  Vitals: Blood pressure 136/80, height _0  (1.702 m), weight 197 lb (89.359 kg).  Physical Examination: General appearance - alert, well appearing, and in no distress, oriented to person, place, and time and overweight Mental status - alert, oriented to person, place, and time, normal mood, behavior, speech, dress, motor activity, and thought processes Abdomen - soft, nontender, nondistended, no masses or organomegaly no bladder distension noted Pelvic - VULVA: vulvar tenderness + yeast, , VAGINA: vaginal discharge - white, curd-like and odorless Time 25+ min  Assessment & Plan:   A:  1. Monilia vaginitis 2. Diabetes poorly controlled 3 Gout 4 htn 5 arousal discussion  P:  1. Diflucan 150 q3d x2 refil x 2 2  Miconazole per vagina

## 2015-04-12 ENCOUNTER — Encounter: Payer: Self-pay | Admitting: Nurse Practitioner

## 2015-04-12 ENCOUNTER — Ambulatory Visit (INDEPENDENT_AMBULATORY_CARE_PROVIDER_SITE_OTHER): Payer: Medicare Other | Admitting: Nurse Practitioner

## 2015-04-12 VITALS — BP 134/66 | HR 80 | Resp 20 | Ht 67.0 in | Wt 195.0 lb

## 2015-04-12 DIAGNOSIS — I2510 Atherosclerotic heart disease of native coronary artery without angina pectoris: Secondary | ICD-10-CM | POA: Diagnosis not present

## 2015-04-12 DIAGNOSIS — R259 Unspecified abnormal involuntary movements: Secondary | ICD-10-CM

## 2015-04-12 DIAGNOSIS — B351 Tinea unguium: Secondary | ICD-10-CM | POA: Diagnosis not present

## 2015-04-12 DIAGNOSIS — M79609 Pain in unspecified limb: Secondary | ICD-10-CM | POA: Diagnosis not present

## 2015-04-12 DIAGNOSIS — M10071 Idiopathic gout, right ankle and foot: Secondary | ICD-10-CM | POA: Diagnosis not present

## 2015-04-12 DIAGNOSIS — M10072 Idiopathic gout, left ankle and foot: Secondary | ICD-10-CM | POA: Diagnosis not present

## 2015-04-12 DIAGNOSIS — M79672 Pain in left foot: Secondary | ICD-10-CM | POA: Diagnosis not present

## 2015-04-12 NOTE — Progress Notes (Addendum)
[facility]  PATIENT: Krystal [name] DOB: Jun 24, 1936   REASON FOR VISIT: follow up for abnormal mouth twitching, occasional body jerks HISTORY FROM:patient    HISTORY OF PRESENT ILLNESS:Ms Krystal [name], 79 year old female returns for followup. She was initialy evaluated by Dr. Rexene [name] 11/01/14. She had abnormal mouth twitching and mouth drawing sensation. The patient is unaccompanied today. She has  underlying medical history of emphysema with pulmonary hypertension, hypertension, hyperlipidemia, sleep apnea, type 2 diabetes, uterine cancer, heart disease, obesity and ex-smoking, who reports lip twitching and a drawing type sensation in her face around her lips and occasional jerking in her upper body. This has been ongoing for about 9 months, off and on, maybe once every 2 days. It affects the lips more, but she can have upper body jerking as well. She had blood work with Dr. Berdine [name] on 10/19/14. She takes hydrocodone very occasionally, maybe every 2 weeks and she has not seen a correlation. She has not been on Reglan or an antidepressant. She had a CTH wo contrast on 10/22/14, which showed  asymmetric area of hypoattenuation in the LEFT posterior frontal cortex and subcortical white matter.  Recommend MRI brain without and with contrast for further evaluation.This has not been done.  Of note, she is on multiple medications including diastolic, Plavix, Crestor, baby aspirin, famotidine, furosemide, isosorbide mononitrate, spironolactone, torsemide, Zetia, hydrocodone as needed, amlodipine, Crestor, famotidine, NovoLog and Victoza.  She  reports she has never had TIA or stroke symptoms, denying sudden onset of one sided weakness, numbness, tingling, slurring of speech or droopy face, hearing loss, tinnitus, diplopia or visual field cut or monocular loss of vision, and denies recurrent headaches, except for occasional sinus type headache she says.Her blood sugars stay in the 200's according  to patient.  She is using her  CPAP every night. EEG on 11/09/14 was normal. She has not had MRI of the brain. She cannot have contrast due to elevated creatinine   REVIEW OF SYSTEMS: Full 14 system review of systems performed and notable only for those listed, all others are neg:  Constitutional: Fatigue  Cardiovascular: neg Ear/Nose/Throat: neg  Skin: neg Eyes: neg Respiratory: Shortness of breath Gastroitestinal: Constipation  Hematology/Lymphatic: neg  Endocrine: Increased thirst Musculoskeletal:neg Allergy/Immunology: neg Neurological: neg Psychiatric: neg Sleep : Obstructive sleep apnea on CPAP   ALLERGIES: Allergies  Allergen Reactions  . Morphine Shortness Of Breath and Swelling  . Penicillins Shortness Of Breath and Swelling    HOME MEDICATIONS: Outpatient Prescriptions Prior to Visit  Medication Sig Dispense Refill  . AMLODIPINE BESYLATE PO Take 5 mg by mouth daily.    Marland Kitchen aspirin 81 MG EC tablet Take 81 mg by mouth every morning.     . budesonide-formoterol (SYMBICORT) 160-4.5 MCG/ACT inhaler Inhale 2 puffs into the lungs 2 (two) times daily as needed (for shortness of breath).    . calcitRIOL (ROCALTROL) 0.25 MCG capsule Take 0.25 mcg by mouth daily. Frequency change. Now taking everyday.    . Cholecalciferol (VITAMIN D3) 2000 UNITS TABS Take by mouth daily.    . clobetasol ointment (TEMOVATE) 0.93 % Apply 1 application topically 2 (two) times daily.    . clopidogrel (PLAVIX) 75 MG tablet TAKE 1 TABLET ONCE DAILY. 30 tablet 6  . ezetimibe (ZETIA) 10 MG tablet Take 10 mg by mouth every morning.     . famotidine (PEPCID) 20 MG tablet TAKE (1) TABLET TWICE DAILY. 60 tablet 3  . ferrous sulfate 325 (65 FE) MG tablet Take  325 mg by mouth daily.     . fluconazole (DIFLUCAN) 150 MG tablet Take 1 tablet (150 mg total) by mouth once. 2 tablet 2  . Fluticasone-Salmeterol (ADVAIR DISKUS) 250-50 MCG/DOSE AEPB Inhale 1 puff into the lungs 2 (two) times daily.     Marland Kitchen  HYDROcodone-acetaminophen (VICODIN) 5-500 MG per tablet Take 1 tablet by mouth 2 (two) times daily as needed for pain.     Marland Kitchen insulin aspart (NOVOLOG) 100 UNIT/ML injection Inject 45 Units into the skin 2 (two) times daily. 40 units in the morning and 40 units in the evening.    . insulin glargine (LANTUS) 100 UNIT/ML injection Inject 40 Units into the skin 2 (two) times daily.     . isosorbide mononitrate (IMDUR) 60 MG 24 hr tablet Take 60 mg daily 60 tablet 3  . isosorbide mononitrate (IMDUR) 60 MG 24 hr tablet TAKE 1&1/2 TABLETS BY MOUTH EVERY MORNING AND 1/2 TABLET IN THE EVENING. 60 tablet 6  . levocetirizine (XYZAL) 5 MG tablet Take 5 mg by mouth every evening.    . Lidocaine-Hydrocortisone Ace 3-2.5 % KIT APPLY TO RECTUM QID FOR 2 WEEKS 1 each 0  . Liraglutide (VICTOZA) 18 MG/3ML SOPN Inject 3 mLs into the skin daily. As directed    . lisinopril (PRINIVIL,ZESTRIL) 10 MG tablet Take 1 tablet (10 mg total) by mouth daily. 90 tablet 3  . miconazole (MICATIN) 2 % cream Apply 1 application topically 2 (two) times daily. 28.35 g 0  . montelukast (SINGULAIR) 10 MG tablet Take 10 mg by mouth at bedtime.    . nebivolol (BYSTOLIC) 5 MG tablet Take 1 tab 2 times daily 60 tablet 3  . NITROSTAT 0.4 MG SL tablet PLACE ONE (1) TABLET UNDER TONGUE EVERY 5 MINUTES UP TO (3) DOSES AS NEEDED FOR CHEST PAIN. 25 tablet 0  . NOVOLOG FLEXPEN 100 UNIT/ML FlexPen USE 25 UNITS AT BREAKFAST, 20 UNITS AT LUNCH AND 35 UNITS AT SUPPER SUB-Q. (Patient taking differently: 40 units in the morning) 30 mL 2  . nystatin-triamcinolone ointment (MYCOLOG) Apply 1 application topically 2 (two) times daily. To affected area. 30 g prn  . OXYGEN-HELIUM IN Inhale into the lungs. 2 liters continuous    . POLYETHYLENE GLYCOL 3350-GRX PO Take by mouth as needed.    . rosuvastatin (CRESTOR) 20 MG tablet Take 20 mg by mouth every evening.    Marland Kitchen spironolactone (ALDACTONE) 25 MG tablet Take 25 mg by mouth daily.    Marland Kitchen tiotropium (SPIRIVA) 18  MCG inhalation capsule Place 18 mcg into inhaler and inhale daily.    Marland Kitchen torsemide (DEMADEX) 20 MG tablet Take 1 tablet (20 mg total) by mouth 2 (two) times daily. 180 tablet 3   No facility-administered medications prior to visit.    PAST MEDICAL HISTORY: Past Medical History  Diagnosis Date  . Sleep apnea     on C-pap  . Occult blood in stools   . Myalgia   . Abnormal PFT   . DM (diabetes mellitus)     type II uncontrolled  . Palpitations   . Microalbuminuria   . Microcytic anemia   . DJD (degenerative joint disease)   . Constipation   . CAD (coronary artery disease) 2009    5 stents- #3 in RCA, #1 each in LAD and AVG  . Osteoarthritis   . Low back pain   . HTN (hypertension)   . Hyperlipidemia   . GERD (gastroesophageal reflux disease)   . Allergic rhinitis   .  Personal history of colonic polyps   . HOH (hard of hearing)   . MI (myocardial infarction) 59  . Glaucoma   . Diastolic dysfunction, left ventricle 09/12/12  . Cancer 04/2010       . Acute renal failure due to procedure 09/18/2012  . CKD (chronic kidney disease) stage 3, GFR 30-59 ml/min 09/18/2012  . Heart block 02/2013    transient  . OSA on CPAP   . On home O2     2L N/C continuously  . Emphysema lung   . Complete heart block, transiet 01/26/2013  . Gout     PAST SURGICAL HISTORY: Past Surgical History  Procedure Laterality Date  . Ectopic pregnancy surgery    . Foot surgery      left and right for callous  . Cervical discectomy      L5 left/hemilminectomy  . Colonoscopy  09/2006    int hemmorhoids, COMPLICATED BY CARDIOPULMONARY COMPLICATIONS  . Abdominal hysterectomy  04/2010    uterine cancer,TAHBSO  . Polypectomy  10/22/2011    internal hemorrhoids/sessile polyp  . Coronary angioplasty  1/99, 1/07, 1/08, 4/09    5 cardiac stents total  . Left and right heart catheterization with coronary angiogram N/A 09/12/2012    Procedure: LEFT AND RIGHT HEART CATHETERIZATION WITH CORONARY ANGIOGRAM;   Surgeon: Sanda Klein, MD;  Location[facility] CATH LAB;  Service: Cardiovascular;  Laterality: N/A;    FAMILY HISTORY: Family History  Problem Relation Age of Onset  . Anesthesia problems Neg Hx   . Hypotension Neg Hx   . Malignant hyperthermia Neg Hx   . Pseudochol deficiency Neg Hx     SOCIAL HISTORY: History   Social History  . Marital Status: Widowed    Spouse Name: N/A  . Number of Children: 0  . Years of Education: 12   Occupational History  .      retired   Social History Main Topics  . Smoking status: Former Smoker -- 0.40 packs/day for 51 years    Types: Cigarettes    Start date: 04/22/1955    Quit date: 10/18/2006  . Smokeless tobacco: Never Used     Comment: quit about 4 yrs ago  . Alcohol Use: No  . Drug Use: No  . Sexual Activity: No   Other Topics Concern  . Not on file   Social History Narrative   Consumes 2 cups of caffeine daily     PHYSICAL EXAM  Filed Vitals:   04/12/15 1509  BP: 134/66  Pulse: 80  Resp: 20  Height: $Remove'5\' 7"'MJwWATh$  (1.702 m)  Weight: 195 lb (88.451 kg)   Body mass index is 30.53 kg/(m^2).  Generalized: Well developed, obese female in no acute distress  Head: normocephalic and atraumatic,. Oropharynx benign  Neck: Supple, no carotid bruits  Cardiac: Regular rate rhythm, no murmur  Musculoskeletal: No deformity   Neurological examination   Mentation: Alert oriented to time, place, history taking. Attention span and concentration appropriate. Recent and remote memory intact.  Follows all commands speech and language fluent.   Cranial nerve II-XII: Fundoscopic exam reveals sharp disc margins.Pupils were equal round reactive to light extraocular movements were full, visual field were full on confrontational test. Facial sensation and strength were normal. hearing was intact to finger rubbing bilaterally. Uvula tongue midline. head turning and shoulder shrug were normal and symmetric.Tongue protrusion into cheek strength was normal.  There is no jaw or voice tremor. There are no abnormal facial movements Motor: normal bulk and  tone, full strength in the BUE, BLE, fine finger movements normal, no pronator drift. No focal weakness. There are no abnormal movements such as dystonic or myoclonus activity seen Sensory: normal and symmetric to light touch, pinprick, and  Vibration, proprioception  Coordination: finger-nose-finger, heel-to-shin bilaterally, no dysmetria Reflexes: Brachioradialis 2/2, biceps 2/2, triceps 2/2, patellar 2/2, Achilles 2/2, plantar responses were flexor bilaterally. Gait and Station: Rising up from seated position without assistance, narrow based gait, ambulates with a cane  smooth turning, able to perform tiptoe, and heel walking without difficulty. Tandem gait is mildly unsteady  DIAGNOSTIC DATA (LABS, IMAGING, TESTING) - ASSESSMENT AND PLAN  79 y.o. year old female  has a past medical history of Sleep apnea; ; Myalgia; Abnormal PFT; DM (diabetes mellitus); Palpitations; Microalbuminuria; Microcytic anemia; DJD (degenerative joint disease);  CAD (coronary artery disease) (2009); Osteoarthritis; Low back pain; HTN (hypertension); Hyperlipidemia; GERD (gastroesophageal reflux disease); Allergic rhinitis; ; MI (myocardial infarction) (98); Glaucoma; Diastolic dysfunction, left ventricle (09/12/12); Acute renal failure due to procedure (09/18/2012); CKD (chronic kidney disease) stage 3, GFR 30-59 ml/min (09/18/2012); Heart block (02/2013); OSA on CPAP; On home O2; Emphysema lung;  and Gout. here to follow-up for abnormal facial movements which are not seen on examination. EEG performed 11/09/2014 was normal. She did not have MRI of the brain  Will obtain MRI of the brain  without contrast She will follow up with Dr. Rexene [name] after this Dennie Bible, [facility], [facility], [facility] [address], [place], [place] [address] [phone]  I reviewed the above note and documentation by  the Nurse Practitioner and agree with the history, physical exam, assessment and plan as outlined above. I was immediately available for face-to-face consultation. Star Age, MD, PhD [facility])

## 2015-04-12 NOTE — Patient Instructions (Addendum)
Will obtain MRI of the brain  without contrast She will follow up with Dr. Rexene Alberts after this

## 2015-04-13 ENCOUNTER — Ambulatory Visit: Payer: Medicare Other | Admitting: Neurology

## 2015-04-13 ENCOUNTER — Encounter: Payer: Self-pay | Admitting: General Practice

## 2015-04-13 NOTE — Telephone Encounter (Signed)
Patient is scheduled 6/23 at 3pm.  Patient is aware and letter mailed to the patient.

## 2015-04-14 ENCOUNTER — Encounter (HOSPITAL_COMMUNITY): Payer: Self-pay

## 2015-04-14 ENCOUNTER — Inpatient Hospital Stay (HOSPITAL_COMMUNITY)
Admission: AD | Admit: 2015-04-14 | Discharge: 2015-04-18 | DRG: 641 | Disposition: A | Discharge status: Home Health | Payer: Medicare Other | Source: Ambulatory Visit | Attending: Internal Medicine | Admitting: Internal Medicine

## 2015-04-14 ENCOUNTER — Ambulatory Visit (INDEPENDENT_AMBULATORY_CARE_PROVIDER_SITE_OTHER): Payer: Medicare Other | Admitting: Adult Health

## 2015-04-14 ENCOUNTER — Encounter: Payer: Self-pay | Admitting: Adult Health

## 2015-04-14 VITALS — BP 118/58 | HR 80 | Ht 67.0 in | Wt 195.0 lb

## 2015-04-14 DIAGNOSIS — I252 Old myocardial infarction: Secondary | ICD-10-CM

## 2015-04-14 DIAGNOSIS — I442 Atrioventricular block, complete: Secondary | ICD-10-CM | POA: Diagnosis present

## 2015-04-14 DIAGNOSIS — D509 Iron deficiency anemia, unspecified: Secondary | ICD-10-CM | POA: Diagnosis present

## 2015-04-14 DIAGNOSIS — Z7902 Long term (current) use of antithrombotics/antiplatelets: Secondary | ICD-10-CM | POA: Diagnosis not present

## 2015-04-14 DIAGNOSIS — N184 Chronic kidney disease, stage 4 (severe): Secondary | ICD-10-CM

## 2015-04-14 DIAGNOSIS — Y838 Other surgical procedures as the cause of abnormal reaction of the patient, or of later complication, without mention of misadventure at the time of the procedure: Secondary | ICD-10-CM | POA: Diagnosis present

## 2015-04-14 DIAGNOSIS — M545 Low back pain: Secondary | ICD-10-CM | POA: Diagnosis present

## 2015-04-14 DIAGNOSIS — Z90710 Acquired absence of both cervix and uterus: Secondary | ICD-10-CM

## 2015-04-14 DIAGNOSIS — E861 Hypovolemia: Principal | ICD-10-CM | POA: Diagnosis present

## 2015-04-14 DIAGNOSIS — Z86010 Personal history of colonic polyps: Secondary | ICD-10-CM

## 2015-04-14 DIAGNOSIS — Z794 Long term (current) use of insulin: Secondary | ICD-10-CM

## 2015-04-14 DIAGNOSIS — I2510 Atherosclerotic heart disease of native coronary artery without angina pectoris: Secondary | ICD-10-CM | POA: Diagnosis present

## 2015-04-14 DIAGNOSIS — M51379 Other intervertebral disc degeneration, lumbosacral region without mention of lumbar back pain or lower extremity pain: Secondary | ICD-10-CM

## 2015-04-14 DIAGNOSIS — Z880 Allergy status to penicillin: Secondary | ICD-10-CM | POA: Diagnosis not present

## 2015-04-14 DIAGNOSIS — M109 Gout, unspecified: Secondary | ICD-10-CM | POA: Diagnosis present

## 2015-04-14 DIAGNOSIS — K219 Gastro-esophageal reflux disease without esophagitis: Secondary | ICD-10-CM | POA: Diagnosis present

## 2015-04-14 DIAGNOSIS — J449 Chronic obstructive pulmonary disease, unspecified: Secondary | ICD-10-CM | POA: Diagnosis present

## 2015-04-14 DIAGNOSIS — Z7982 Long term (current) use of aspirin: Secondary | ICD-10-CM | POA: Diagnosis not present

## 2015-04-14 DIAGNOSIS — M791 Myalgia: Secondary | ICD-10-CM | POA: Diagnosis present

## 2015-04-14 DIAGNOSIS — R42 Dizziness and giddiness: Secondary | ICD-10-CM | POA: Diagnosis not present

## 2015-04-14 DIAGNOSIS — I5033 Acute on chronic diastolic (congestive) heart failure: Secondary | ICD-10-CM

## 2015-04-14 DIAGNOSIS — M1990 Unspecified osteoarthritis, unspecified site: Secondary | ICD-10-CM | POA: Diagnosis present

## 2015-04-14 DIAGNOSIS — R259 Unspecified abnormal involuntary movements: Secondary | ICD-10-CM

## 2015-04-14 DIAGNOSIS — Z8542 Personal history of malignant neoplasm of other parts of uterus: Secondary | ICD-10-CM | POA: Diagnosis not present

## 2015-04-14 DIAGNOSIS — T82858D Stenosis of vascular prosthetic devices, implants and grafts, subsequent encounter: Secondary | ICD-10-CM | POA: Diagnosis not present

## 2015-04-14 DIAGNOSIS — H9190 Unspecified hearing loss, unspecified ear: Secondary | ICD-10-CM | POA: Diagnosis present

## 2015-04-14 DIAGNOSIS — E785 Hyperlipidemia, unspecified: Secondary | ICD-10-CM | POA: Diagnosis present

## 2015-04-14 DIAGNOSIS — Z87891 Personal history of nicotine dependence: Secondary | ICD-10-CM | POA: Diagnosis not present

## 2015-04-14 DIAGNOSIS — Z9981 Dependence on supplemental oxygen: Secondary | ICD-10-CM | POA: Diagnosis not present

## 2015-04-14 DIAGNOSIS — R079 Chest pain, unspecified: Secondary | ICD-10-CM | POA: Diagnosis not present

## 2015-04-14 DIAGNOSIS — G4733 Obstructive sleep apnea (adult) (pediatric): Secondary | ICD-10-CM

## 2015-04-14 DIAGNOSIS — K5909 Other constipation: Secondary | ICD-10-CM

## 2015-04-14 DIAGNOSIS — N179 Acute kidney failure, unspecified: Secondary | ICD-10-CM | POA: Diagnosis not present

## 2015-04-14 DIAGNOSIS — N939 Abnormal uterine and vaginal bleeding, unspecified: Secondary | ICD-10-CM

## 2015-04-14 DIAGNOSIS — R05 Cough: Secondary | ICD-10-CM | POA: Diagnosis not present

## 2015-04-14 DIAGNOSIS — Z79899 Other long term (current) drug therapy: Secondary | ICD-10-CM | POA: Diagnosis not present

## 2015-04-14 DIAGNOSIS — J9611 Chronic respiratory failure with hypoxia: Secondary | ICD-10-CM

## 2015-04-14 DIAGNOSIS — R197 Diarrhea, unspecified: Secondary | ICD-10-CM

## 2015-04-14 DIAGNOSIS — R0989 Other specified symptoms and signs involving the circulatory and respiratory systems: Secondary | ICD-10-CM

## 2015-04-14 DIAGNOSIS — J309 Allergic rhinitis, unspecified: Secondary | ICD-10-CM | POA: Diagnosis present

## 2015-04-14 DIAGNOSIS — Z885 Allergy status to narcotic agent status: Secondary | ICD-10-CM | POA: Diagnosis not present

## 2015-04-14 DIAGNOSIS — D539 Nutritional anemia, unspecified: Secondary | ICD-10-CM

## 2015-04-14 DIAGNOSIS — N183 Chronic kidney disease, stage 3 unspecified: Secondary | ICD-10-CM

## 2015-04-14 DIAGNOSIS — E1129 Type 2 diabetes mellitus with other diabetic kidney complication: Secondary | ICD-10-CM | POA: Diagnosis not present

## 2015-04-14 DIAGNOSIS — E860 Dehydration: Secondary | ICD-10-CM

## 2015-04-14 DIAGNOSIS — I129 Hypertensive chronic kidney disease with stage 1 through stage 4 chronic kidney disease, or unspecified chronic kidney disease: Secondary | ICD-10-CM | POA: Diagnosis present

## 2015-04-14 DIAGNOSIS — E1121 Type 2 diabetes mellitus with diabetic nephropathy: Secondary | ICD-10-CM

## 2015-04-14 DIAGNOSIS — R002 Palpitations: Secondary | ICD-10-CM | POA: Diagnosis present

## 2015-04-14 DIAGNOSIS — I10 Essential (primary) hypertension: Secondary | ICD-10-CM | POA: Diagnosis present

## 2015-04-14 DIAGNOSIS — J4489 Other specified chronic obstructive pulmonary disease: Secondary | ICD-10-CM

## 2015-04-14 DIAGNOSIS — E1122 Type 2 diabetes mellitus with diabetic chronic kidney disease: Secondary | ICD-10-CM | POA: Diagnosis present

## 2015-04-14 DIAGNOSIS — H409 Unspecified glaucoma: Secondary | ICD-10-CM | POA: Diagnosis present

## 2015-04-14 DIAGNOSIS — I5032 Chronic diastolic (congestive) heart failure: Secondary | ICD-10-CM

## 2015-04-14 DIAGNOSIS — M5137 Other intervertebral disc degeneration, lumbosacral region: Secondary | ICD-10-CM

## 2015-04-14 DIAGNOSIS — R55 Syncope and collapse: Secondary | ICD-10-CM | POA: Diagnosis not present

## 2015-04-14 DIAGNOSIS — I25119 Atherosclerotic heart disease of native coronary artery with unspecified angina pectoris: Secondary | ICD-10-CM | POA: Diagnosis not present

## 2015-04-14 DIAGNOSIS — E119 Type 2 diabetes mellitus without complications: Secondary | ICD-10-CM

## 2015-04-14 HISTORY — DX: Type 2 diabetes mellitus with diabetic nephropathy: E11.21

## 2015-04-14 HISTORY — DX: Personal history of malignant neoplasm of other parts of uterus: Z85.42

## 2015-04-14 HISTORY — DX: Essential (primary) hypertension: I10

## 2015-04-14 LAB — CBC
HCT: 38.5 % (ref 36.0–46.0)
HEMOGLOBIN: 11.9 g/dL — AB (ref 12.0–15.0)
MCH: 23.6 pg — AB (ref 26.0–34.0)
MCHC: 30.9 g/dL (ref 30.0–36.0)
MCV: 76.2 fL — ABNORMAL LOW (ref 78.0–100.0)
Platelets: 332 10*3/uL (ref 150–400)
RBC: 5.05 MIL/uL (ref 3.87–5.11)
RDW: 15.6 % — ABNORMAL HIGH (ref 11.5–15.5)
WBC: 9.4 10*3/uL (ref 4.0–10.5)

## 2015-04-14 LAB — COMPREHENSIVE METABOLIC PANEL
ALK PHOS: 73 U/L (ref 38–126)
ALT: 18 U/L (ref 14–54)
AST: 20 U/L (ref 15–41)
Albumin: 4.3 g/dL (ref 3.5–5.0)
Anion gap: 13 (ref 5–15)
BUN: 66 mg/dL — ABNORMAL HIGH (ref 6–20)
CHLORIDE: 93 mmol/L — AB (ref 101–111)
CO2: 31 mmol/L (ref 22–32)
Calcium: 10.4 mg/dL — ABNORMAL HIGH (ref 8.9–10.3)
Creatinine, Ser: 3.08 mg/dL — ABNORMAL HIGH (ref 0.44–1.00)
GFR calc Af Amer: 16 mL/min — ABNORMAL LOW (ref >60)
GFR calc non Af Amer: 14 mL/min — ABNORMAL LOW (ref >60)
GLUCOSE: 107 mg/dL — AB (ref 65–99)
Potassium: 4.1 mmol/L (ref 3.5–5.1)
Sodium: 137 mmol/L (ref 135–145)
TOTAL PROTEIN: 8.2 g/dL — AB (ref 6.5–8.1)
Total Bilirubin: 0.5 mg/dL (ref 0.3–1.2)

## 2015-04-14 LAB — GLUCOSE, CAPILLARY: GLUCOSE-CAPILLARY: 209 mg/dL — AB (ref 65–99)

## 2015-04-14 LAB — TROPONIN I: Troponin I: <0.03 ng/mL (ref <0.031)

## 2015-04-14 MED ORDER — CALCITRIOL 0.25 MCG PO CAPS
0.2500 ug | ORAL_CAPSULE | Freq: Every day | ORAL | Status: DC
  Administered 2015-04-15 – 2015-04-18 (×4): 0.25 ug via ORAL
  Filled 2015-04-14 (×4): qty 1

## 2015-04-14 MED ORDER — INSULIN GLARGINE 100 UNIT/ML ~~LOC~~ SOLN
40.0000 [IU] | Freq: Two times a day (BID) | SUBCUTANEOUS | Status: DC
  Administered 2015-04-14: 40 [IU] via SUBCUTANEOUS
  Filled 2015-04-14 (×2): qty 0.4

## 2015-04-14 MED ORDER — LEVOCETIRIZINE DIHYDROCHLORIDE 5 MG PO TABS: 5.0000 mg | ORAL_TABLET | Freq: Every evening | ORAL | Status: DC

## 2015-04-14 MED ORDER — NEBIVOLOL HCL 10 MG PO TABS
5.0000 mg | ORAL_TABLET | Freq: Two times a day (BID) | ORAL | Status: DC
  Administered 2015-04-15 – 2015-04-18 (×7): 5 mg via ORAL
  Filled 2015-04-14 (×7): qty 1

## 2015-04-14 MED ORDER — ISOSORBIDE MONONITRATE ER 60 MG PO TB24
60.0000 mg | ORAL_TABLET | Freq: Every day | ORAL | Status: DC
  Administered 2015-04-15 – 2015-04-17 (×3): 60 mg via ORAL
  Filled 2015-04-14 (×3): qty 1

## 2015-04-14 MED ORDER — CLOPIDOGREL BISULFATE 75 MG PO TABS
75.0000 mg | ORAL_TABLET | Freq: Every day | ORAL | Status: DC
  Administered 2015-04-15 – 2015-04-18 (×4): 75 mg via ORAL
  Filled 2015-04-14 (×4): qty 1

## 2015-04-14 MED ORDER — NYSTATIN-TRIAMCINOLONE 100000-0.1 UNIT/GM-% EX CREA
TOPICAL_CREAM | CUTANEOUS | Status: AC
  Filled 2015-04-14: qty 15

## 2015-04-14 MED ORDER — LIRAGLUTIDE 18 MG/3ML ~~LOC~~ SOPN
3.0000 mL | PEN_INJECTOR | Freq: Every day | SUBCUTANEOUS | Status: DC
  Filled 2015-04-14: qty 3

## 2015-04-14 MED ORDER — MONTELUKAST SODIUM 10 MG PO TABS
10.0000 mg | ORAL_TABLET | Freq: Every day | ORAL | Status: DC
  Administered 2015-04-14 – 2015-04-17 (×4): 10 mg via ORAL
  Filled 2015-04-14 (×4): qty 1

## 2015-04-14 MED ORDER — MOMETASONE FURO-FORMOTEROL FUM 100-5 MCG/ACT IN AERO
INHALATION_SPRAY | RESPIRATORY_TRACT | Status: AC
  Filled 2015-04-14: qty 8.8

## 2015-04-14 MED ORDER — HEPARIN SODIUM (PORCINE) 5000 UNIT/ML IJ SOLN
5000.0000 [IU] | Freq: Three times a day (TID) | INTRAMUSCULAR | Status: DC
  Administered 2015-04-14 – 2015-04-18 (×11): 5000 [IU] via SUBCUTANEOUS
  Filled 2015-04-14 (×11): qty 1

## 2015-04-14 MED ORDER — ROSUVASTATIN CALCIUM 20 MG PO TABS
20.0000 mg | ORAL_TABLET | Freq: Every evening | ORAL | Status: DC
  Administered 2015-04-15 – 2015-04-17 (×3): 20 mg via ORAL
  Filled 2015-04-14 (×3): qty 1

## 2015-04-14 MED ORDER — INSULIN ASPART 100 UNIT/ML ~~LOC~~ SOLN
0.0000 [IU] | Freq: Every day | SUBCUTANEOUS | Status: DC
  Administered 2015-04-14: 2 [IU] via SUBCUTANEOUS
  Administered 2015-04-15: 3 [IU] via SUBCUTANEOUS
  Administered 2015-04-16: 2 [IU] via SUBCUTANEOUS
  Administered 2015-04-17: 3 [IU] via SUBCUTANEOUS

## 2015-04-14 MED ORDER — LORATADINE 10 MG PO TABS
10.0000 mg | ORAL_TABLET | Freq: Every day | ORAL | Status: DC
  Administered 2015-04-14 – 2015-04-17 (×4): 10 mg via ORAL
  Filled 2015-04-14 (×5): qty 1

## 2015-04-14 MED ORDER — ONDANSETRON HCL 4 MG/2ML IJ SOLN: 4.0000 mg | Freq: Four times a day (QID) | INTRAMUSCULAR | Status: DC | PRN

## 2015-04-14 MED ORDER — NITROGLYCERIN 0.4 MG SL SUBL: 0.4000 mg | SUBLINGUAL_TABLET | SUBLINGUAL | Status: DC | PRN

## 2015-04-14 MED ORDER — INSULIN ASPART 100 UNIT/ML ~~LOC~~ SOLN
0.0000 [IU] | Freq: Three times a day (TID) | SUBCUTANEOUS | Status: DC
  Administered 2015-04-15: 8 [IU] via SUBCUTANEOUS
  Administered 2015-04-15: 3 [IU] via SUBCUTANEOUS
  Administered 2015-04-15: 5 [IU] via SUBCUTANEOUS
  Administered 2015-04-16: 8 [IU] via SUBCUTANEOUS
  Administered 2015-04-16: 5 [IU] via SUBCUTANEOUS
  Administered 2015-04-16: 8 [IU] via SUBCUTANEOUS
  Administered 2015-04-17: 15 [IU] via SUBCUTANEOUS
  Administered 2015-04-17: 5 [IU] via SUBCUTANEOUS
  Administered 2015-04-17: 3 [IU] via SUBCUTANEOUS
  Administered 2015-04-18: 8 [IU] via SUBCUTANEOUS
  Administered 2015-04-18: 5 [IU] via SUBCUTANEOUS

## 2015-04-14 MED ORDER — LISINOPRIL 10 MG PO TABS
10.0000 mg | ORAL_TABLET | Freq: Every day | ORAL | Status: DC
  Administered 2015-04-15 – 2015-04-18 (×4): 10 mg via ORAL
  Filled 2015-04-14 (×4): qty 1

## 2015-04-14 MED ORDER — AMLODIPINE BESYLATE 5 MG PO TABS
5.0000 mg | ORAL_TABLET | Freq: Every day | ORAL | Status: DC
  Administered 2015-04-15 – 2015-04-18 (×4): 5 mg via ORAL
  Filled 2015-04-14 (×4): qty 1

## 2015-04-14 MED ORDER — SODIUM CHLORIDE 0.9 % IV SOLN
INTRAVENOUS | Status: DC
  Administered 2015-04-15 – 2015-04-16 (×3): via INTRAVENOUS

## 2015-04-14 MED ORDER — TIOTROPIUM BROMIDE MONOHYDRATE 18 MCG IN CAPS
18.0000 ug | ORAL_CAPSULE | Freq: Every day | RESPIRATORY_TRACT | Status: DC
  Administered 2015-04-15 – 2015-04-18 (×4): 18 ug via RESPIRATORY_TRACT
  Filled 2015-04-14 (×2): qty 5

## 2015-04-14 MED ORDER — MOMETASONE FURO-FORMOTEROL FUM 100-5 MCG/ACT IN AERO
2.0000 | INHALATION_SPRAY | Freq: Two times a day (BID) | RESPIRATORY_TRACT | Status: DC
  Administered 2015-04-14 – 2015-04-18 (×8): 2 via RESPIRATORY_TRACT
  Filled 2015-04-14: qty 8.8

## 2015-04-14 MED ORDER — EZETIMIBE 10 MG PO TABS
10.0000 mg | ORAL_TABLET | Freq: Every morning | ORAL | Status: DC
  Administered 2015-04-15 – 2015-04-18 (×4): 10 mg via ORAL
  Filled 2015-04-14 (×4): qty 1

## 2015-04-14 MED ORDER — ASPIRIN EC 81 MG PO TBEC
81.0000 mg | DELAYED_RELEASE_TABLET | Freq: Every morning | ORAL | Status: DC
  Administered 2015-04-15 – 2015-04-18 (×4): 81 mg via ORAL
  Filled 2015-04-14 (×4): qty 1

## 2015-04-14 MED ORDER — ISOSORBIDE MONONITRATE ER 60 MG PO TB24: 60.0000 mg | ORAL_TABLET | Freq: Every day | ORAL | Status: DC

## 2015-04-14 MED ORDER — SODIUM CHLORIDE 0.9 % IJ SOLN
3.0000 mL | Freq: Two times a day (BID) | INTRAMUSCULAR | Status: DC
  Administered 2015-04-15 – 2015-04-18 (×5): 3 mL via INTRAVENOUS

## 2015-04-14 MED ORDER — BUDESONIDE-FORMOTEROL FUMARATE 160-4.5 MCG/ACT IN AERO
2.0000 | INHALATION_SPRAY | Freq: Two times a day (BID) | RESPIRATORY_TRACT | Status: DC | PRN
  Administered 2015-04-17: 2 via RESPIRATORY_TRACT
  Filled 2015-04-14: qty 6

## 2015-04-14 MED ORDER — FLUCONAZOLE 100 MG PO TABS
150.0000 mg | ORAL_TABLET | Freq: Once | ORAL | Status: AC
  Administered 2015-04-14: 150 mg via ORAL
  Filled 2015-04-14: qty 2

## 2015-04-14 MED ORDER — CLOBETASOL PROPIONATE 0.05 % EX OINT
1.0000 "application " | TOPICAL_OINTMENT | Freq: Two times a day (BID) | CUTANEOUS | Status: DC
  Administered 2015-04-14 – 2015-04-18 (×8): 1 via TOPICAL
  Filled 2015-04-14: qty 15

## 2015-04-14 MED ORDER — HYDROCODONE-ACETAMINOPHEN 5-325 MG PO TABS: 1.0000 | ORAL_TABLET | Freq: Four times a day (QID) | ORAL | Status: DC | PRN

## 2015-04-14 MED ORDER — MICONAZOLE NITRATE 2 % EX CREA
1.0000 "application " | TOPICAL_CREAM | Freq: Two times a day (BID) | CUTANEOUS | Status: DC
  Filled 2015-04-14: qty 14

## 2015-04-14 MED ORDER — NYSTATIN-TRIAMCINOLONE 100000-0.1 UNIT/GM-% EX OINT
1.0000 "application " | TOPICAL_OINTMENT | Freq: Two times a day (BID) | CUTANEOUS | Status: DC
  Administered 2015-04-14: 1 via TOPICAL
  Filled 2015-04-14: qty 15

## 2015-04-14 MED ORDER — FAMOTIDINE 20 MG PO TABS
20.0000 mg | ORAL_TABLET | Freq: Every day | ORAL | Status: DC
  Administered 2015-04-15 – 2015-04-18 (×4): 20 mg via ORAL
  Filled 2015-04-14 (×4): qty 1

## 2015-04-14 MED ORDER — FERROUS SULFATE 325 (65 FE) MG PO TABS
325.0000 mg | ORAL_TABLET | Freq: Every day | ORAL | Status: DC
  Administered 2015-04-15 – 2015-04-18 (×4): 325 mg via ORAL
  Filled 2015-04-14 (×4): qty 1

## 2015-04-14 MED ORDER — ONDANSETRON HCL 4 MG PO TABS: 4.0000 mg | ORAL_TABLET | Freq: Four times a day (QID) | ORAL | Status: DC | PRN

## 2015-04-14 NOTE — Progress Notes (Deleted)
Name: Krystal [name]    DOB: 11-03-1936  Age: 79 y.o.  MR#: [id number]       PCP:  [name], MD      Insurance: Payor: MEDICARE / Plan: MEDICARE PART A AND B / Product Type: *No Product type* /   CC:    Chief Complaint  Patient presents with  . Congestive Heart Failure    Diastolic  . Hypertension  . Coronary Artery Disease    Multiple stents    VS Filed Vitals:   04/14/15 1415  BP: 118/58  Pulse: 80  Height: _0  (1.702 m)  Weight: 195 lb (88.451 kg)  SpO2: 96%    Weights Current Weight  04/14/15 195 lb (88.451 kg)  04/12/15 195 lb (88.451 kg)  04/08/15 197 lb (89.359 kg)    Blood Pressure  BP Readings from Last 3 Encounters:  04/14/15 118/58  04/12/15 134/66  04/08/15 136/80     Admit date:  (Not on file) Last encounter with RMR:  Visit date not found   Allergy Morphine and Penicillins  Current Outpatient Prescriptions  Medication Sig Dispense Refill  . AMLODIPINE BESYLATE PO Take 5 mg by mouth daily.    Marland Kitchen aspirin 81 MG EC tablet Take 81 mg by mouth every morning.     . budesonide-formoterol (SYMBICORT) 160-4.5 MCG/ACT inhaler Inhale 2 puffs into the lungs 2 (two) times daily as needed (for shortness of breath).    . calcitRIOL (ROCALTROL) 0.25 MCG capsule Take 0.25 mcg by mouth daily. Frequency change. Now taking everyday.    . Cholecalciferol (VITAMIN D3) 2000 UNITS TABS Take by mouth daily.    . clobetasol ointment (TEMOVATE) 6.25 % Apply 1 application topically 2 (two) times daily.    . clopidogrel (PLAVIX) 75 MG tablet TAKE 1 TABLET ONCE DAILY. 30 tablet 6  . ezetimibe (ZETIA) 10 MG tablet Take 10 mg by mouth every morning.     . famotidine (PEPCID) 20 MG tablet TAKE (1) TABLET TWICE DAILY. 60 tablet 3  . ferrous sulfate 325 (65 FE) MG tablet Take 325 mg by mouth daily.     . fluconazole (DIFLUCAN) 150 MG tablet Take 1 tablet (150 mg total) by mouth once. 2 tablet 2  . Fluticasone-Salmeterol (ADVAIR DISKUS) 250-50 MCG/DOSE AEPB Inhale 1 puff into  the lungs 2 (two) times daily.     Marland Kitchen HYDROcodone-acetaminophen (VICODIN) 5-500 MG per tablet Take 1 tablet by mouth 2 (two) times daily as needed for pain.     Marland Kitchen insulin aspart (NOVOLOG) 100 UNIT/ML injection Inject 45 Units into the skin 2 (two) times daily. 40 units in the morning and 40 units in the evening.    . insulin glargine (LANTUS) 100 UNIT/ML injection Inject 40 Units into the skin 2 (two) times daily.     . isosorbide mononitrate (IMDUR) 60 MG 24 hr tablet Take 60 mg daily 60 tablet 3  . isosorbide mononitrate (IMDUR) 60 MG 24 hr tablet TAKE 1&1/2 TABLETS BY MOUTH EVERY MORNING AND 1/2 TABLET IN THE EVENING. 60 tablet 6  . levocetirizine (XYZAL) 5 MG tablet Take 5 mg by mouth every evening.    . Lidocaine-Hydrocortisone Ace 3-2.5 % KIT APPLY TO RECTUM QID FOR 2 WEEKS 1 each 0  . Liraglutide (VICTOZA) 18 MG/3ML SOPN Inject 3 mLs into the skin daily. As directed    . lisinopril (PRINIVIL,ZESTRIL) 10 MG tablet Take 1 tablet (10 mg total) by mouth daily. 90 tablet 3  . miconazole (MICATIN) 2 %  cream Apply 1 application topically 2 (two) times daily. 28.35 g 0  . montelukast (SINGULAIR) 10 MG tablet Take 10 mg by mouth at bedtime.    Marland Kitchen NITROSTAT 0.4 MG SL tablet PLACE ONE (1) TABLET UNDER TONGUE EVERY 5 MINUTES UP TO (3) DOSES AS NEEDED FOR CHEST PAIN. 25 tablet 0  . NOVOLOG FLEXPEN 100 UNIT/ML FlexPen USE 25 UNITS AT BREAKFAST, 20 UNITS AT LUNCH AND 35 UNITS AT SUPPER SUB-Q. (Patient taking differently: 40 units in the morning) 30 mL 2  . nystatin-triamcinolone ointment (MYCOLOG) Apply 1 application topically 2 (two) times daily. To affected area. 30 g prn  . OXYGEN-HELIUM IN Inhale into the lungs. 2 liters continuous    . POLYETHYLENE GLYCOL 3350-GRX PO Take by mouth as needed.    . rosuvastatin (CRESTOR) 20 MG tablet Take 20 mg by mouth every evening.    Marland Kitchen spironolactone (ALDACTONE) 25 MG tablet Take 25 mg by mouth daily.    Marland Kitchen tiotropium (SPIRIVA) 18 MCG inhalation capsule Place 18 mcg  into inhaler and inhale daily.    . nebivolol (BYSTOLIC) 5 MG tablet Take 1 tab 2 times daily (Patient not taking: Reported on 04/14/2015) 60 tablet 3  . torsemide (DEMADEX) 20 MG tablet Take 1 tablet (20 mg total) by mouth 2 (two) times daily. (Patient taking differently: Take 20 mg by mouth daily. ) 180 tablet 3   No current facility-administered medications for this visit.    Discontinued Meds:   There are no discontinued medications.  Patient Active Problem List   Diagnosis Date Noted  . Abnormal involuntary movements 04/12/2015  . Diarrhea 10/20/2014  . CHF, acute on chronic 03/05/2014  . Vaginal bleeding, abnormal 01/18/2014  . Leg edema 08/15/2013  . Type II or unspecified type diabetes mellitus with renal manifestations, uncontrolled 06/16/2013  . Chronic kidney disease, unspecified 06/16/2013  . Complete heart block, transiet 01/26/2013  . CKD (chronic kidney disease) stage 3, GFR 30-59 ml/min 09/18/2012  . Acute on chronic diastolic heart failure - with SSx of Unstable Angina 09/13/2012  . OBESITY 05/20/2009  . PERIPHERAL NEUROPATHY 05/20/2009  . NEVUS, ATYPICAL 05/06/2009  . HYPERTENSION, PULMONARY - partially due to LV diastolic dysfunction 37/16/9678  . BRONCHITIS, CHRONIC 12/16/2008  . DIASTOLIC DYSFUNCTION, normal LVF 04/30/2008  . SLEEP APNEA, on C-pap 04/30/2008  . MICROCYTIC ANEMIA 09/23/2007  . [place] DISEASE, LUMBOSACRAL SPINE 04/08/2007  . HYPERLIPIDEMIA 10/29/2006  . CAD, prior LAD/RCA/CFX stents, cath 09/12/12 with patent stents, low risk BNuc 01/14/13 10/29/2006  . ALLERGIC RHINITIS 10/29/2006  . GERD 10/29/2006  . CONSTIPATION, OTHER 10/29/2006  . OSTEOARTHRITIS 10/29/2006  . COLONIC POLYPS, HX OF 10/29/2006    LABS    Component Value Date/Time   NA 136 11/01/2014 1509   NA 141 05/13/2014 1051   NA 139 03/23/2014 1149   NA 142 03/13/2014 0636   K 4.2 11/01/2014 1509   K 3.8 05/13/2014 1051   K 4.3 03/23/2014 1149   CL 92* 11/01/2014  1509   CL 99 05/13/2014 1051   CL 98 03/23/2014 1149   CO2 26 11/01/2014 1509   CO2 29 05/13/2014 1051   CO2 29 03/23/2014 1149   GLUCOSE 336* 11/01/2014 1509   GLUCOSE 185* 05/13/2014 1051   GLUCOSE 217* 03/23/2014 1149   GLUCOSE 291* 03/13/2014 0636   BUN 44* 11/01/2014 1509   BUN 33* 05/13/2014 1051   BUN 40* 03/23/2014 1149   BUN 44* 03/13/2014 0636   CREATININE 2.33* 11/01/2014 1509  CREATININE 1.52* 05/13/2014 1051   CREATININE 2.62* 03/23/2014 1149   CREATININE 2.13* 03/13/2014 0636   CREATININE 2.22* 03/12/2014 0541   CREATININE 1.30* 12/18/2013 1058   CALCIUM 9.4 11/01/2014 1509   CALCIUM 9.9 05/13/2014 1051   CALCIUM 9.4 03/23/2014 1149   CALCIUM 8.6 03/11/2014 1102   GFRNONAA 20* 11/01/2014 1509   GFRNONAA 22* 03/13/2014 0636   GFRNONAA 20* 03/12/2014 0541   GFRAA 23* 11/01/2014 1509   GFRAA 25* 03/13/2014 0636   GFRAA 24* 03/12/2014 0541   CMP     Component Value Date/Time   NA 136 11/01/2014 1509   NA 141 05/13/2014 1051   K 4.2 11/01/2014 1509   CL 92* 11/01/2014 1509   CO2 26 11/01/2014 1509   GLUCOSE 336* 11/01/2014 1509   GLUCOSE 185* 05/13/2014 1051   BUN 44* 11/01/2014 1509   BUN 33* 05/13/2014 1051   CREATININE 2.33* 11/01/2014 1509   CREATININE 1.52* 05/13/2014 1051   CALCIUM 9.4 11/01/2014 1509   CALCIUM 8.6 03/11/2014 1102   PROT 7.2 11/01/2014 1509   PROT 6.3 12/18/2013 1058   ALBUMIN 3.2* 03/09/2014 1403   AST 15 11/01/2014 1509   ALT 13 11/01/2014 1509   ALKPHOS 70 11/01/2014 1509   BILITOT 0.2 11/01/2014 1509   GFRNONAA 20* 11/01/2014 1509   GFRAA 23* 11/01/2014 1509       Component Value Date/Time   WBC 7.2 03/12/2014 0541   WBC 6.2 03/06/2014 0550   WBC 7.2 03/05/2014 1025   HGB 11.1* 03/12/2014 0541   HGB 10.1* 03/06/2014 0550   HGB 11.4* 03/05/2014 1025   HCT 36.0 03/12/2014 0541   HCT 32.5* 03/06/2014 0550   HCT 36.6 03/05/2014 1025   MCV 77.6* 03/12/2014 0541   MCV 78.5 03/06/2014 0550   MCV 78.7 03/05/2014 1025     Lipid Panel     Component Value Date/Time   CHOL 191 12/18/2013 1058   TRIG 186* 12/18/2013 1058   HDL 39* 12/18/2013 1058   CHOLHDL 4.9 12/18/2013 1058   VLDL 37 12/18/2013 1058   LDLCALC 115* 12/18/2013 1058   LDLDIRECT 118.4 08/13/2013 1502    ABG    Component Value Date/Time   PHART 7.354 09/12/2012 1309   PCO2ART 47.6* 09/12/2012 1309   PO2ART 88.0 09/12/2012 1309   HCO3 27.7* 09/12/2012 1314   TCO2 29 09/12/2012 1314   O2SAT 48.0 09/12/2012 1314     Lab Results  Component Value Date   TSH 3.950 11/01/2014   BNP (last 3 results) No results for input(s): BNP in the last 8760 hours.  ProBNP (last 3 results) No results for input(s): PROBNP in the last 8760 hours.  Cardiac Panel (last 3 results) No results for input(s): CKTOTAL, CKMB, TROPONINI, RELINDX in the last 72 hours.  Iron/TIBC/Ferritin/ %Sat    Component Value Date/Time   IRON 55 05/21/2009 0026   TIBC 353 05/21/2009 0026   FERRITIN 25 06/26/2006   IRONPCTSAT 16* 05/21/2009 0026     EKG Orders placed or performed during the hospital encounter of 03/05/14  . EKG 12-Lead  . EKG 12-Lead  . EKG     Prior Assessment and Plan Problem List as of 04/14/2015      Cardiovascular and Mediastinum   CAD, prior LAD/RCA/CFX stents, cath 09/12/12 with patent stents, low risk BNuc 01/14/13   HYPERTENSION, PULMONARY - partially due to LV diastolic dysfunction   DIASTOLIC DYSFUNCTION, normal LVF   Acute on chronic diastolic heart failure - with SSx of  Unstable Angina   Complete heart block, transiet   CHF, acute on chronic     Respiratory   BRONCHITIS, CHRONIC   ALLERGIC RHINITIS     Digestive   GERD   CONSTIPATION, OTHER     Endocrine   Type II or unspecified type diabetes mellitus with renal manifestations, uncontrolled     Nervous and Auditory   PERIPHERAL NEUROPATHY   Abnormal involuntary movements     Musculoskeletal and Integument   NEVUS, ATYPICAL   OSTEOARTHRITIS   DEGENERATIVE DISC  DISEASE, LUMBOSACRAL SPINE     Genitourinary   CKD (chronic kidney disease) stage 3, GFR 30-59 ml/min   Chronic kidney disease, unspecified     Other   HYPERLIPIDEMIA   OBESITY   SLEEP APNEA, on C-pap   MICROCYTIC ANEMIA   COLONIC POLYPS, HX OF   Last Assessment & Plan 09/27/2011 Office Visit Written 09/27/2011  4:29 PM by Danie Binder, MD    TCS DEC 2012 DUE TO CARDIOPULMONARY COMLICATIONS WITH MODERATE SEDATION. HOLD IRON FOR 7 DAYS. HOLD BYETTA AND GLIPIZIDE AM OF PROCEDURE. 1/2 HUMALOG NIGHT PRIOR AND HOLD AM OF OPV PRN       Leg edema   Vaginal bleeding, abnormal   Diarrhea   Last Assessment & Plan 11/25/2014 Office Visit Edited 11/25/2014 12:24 PM by Danie Binder, MD    SX IMPROVED AVOIDING DAIRY FOR THE MOST PART. TCS UTD. HX: C DIFF COLITIS. ABOUT TO RECIVE ABX FOR URI. INTENTIONAL WEIGHT LOSS  NO INDICATION FOR ENDOSCOPY AT THIS TIME. ADD PROBIOTIC DAILY. AVOID DAIRY FOLLOW UP IN 4 MOS.               Imaging: Dg Chest 2 View  03/28/2015   CLINICAL DATA:  Chest pain, shortness of breath, LEFT arm pain, symptoms for a few days, hypertension, diabetes, coronary artery disease post MI, chronic kidney disease, former smoker  EXAM: CHEST  2 VIEW  COMPARISON:  03/09/2014  FINDINGS: Upper normal heart size.  Atherosclerotic calcification aorta.  Mediastinal contours and pulmonary vascularity normal.  Lungs clear.  No pleural effusion or pneumothorax.  Bones unremarkable.  IMPRESSION: No acute abnormalities.   Electronically Signed   By: Lavonia Dana M.D.   On: 03/28/2015 13:15

## 2015-04-14 NOTE — Progress Notes (Signed)
Direct admission admitted to [phone] arrived by wheelchair. Dr. Anastasio Champion into see patient. Attempt to start IV X 2

## 2015-04-14 NOTE — H&P (Signed)
[facility] History and Physical  SHECCID LAHMANN FBP:[id number] DOB: 1936/01/14 DOA: 04/14/2015  Referring physician: Dr. Domenic Polite PCP: [name], MD   Chief Complaint: Dizziness/lightheadedness.  HPI: Krystal [name] is a 79 y.o. female  This is a very pleasant 79 year old lady who has a history of chronic diastolic congestive heart failure and is on several diuretics presented with two-week history of lightheadedness/dizziness to the cardiology clinic today. She was seen by the nurse practitioner, Jory Sims, who scrutinized the medication list and saw that the patient was on furosemide, furosemide and spironolactone. The patient denies dyspnea, but says that she has chest pain and palpitations. Because of her near syncopal episode, she is afraid of being home alone. She feels something is clearly wrong. She states that she has been taking medication as she was instructed. She is diabetic. The patient's baseline creatinine is around 2.0. Blood drawn today shows creatinine significantly elevated at 3.08 with BUN of 66. She is now being admitted for further management.   Review of Systems:  Apart from symptoms above, all systems negative.  Past Medical History  Diagnosis Date  . Sleep apnea     on C-pap  . Occult blood in stools   . Myalgia   . Abnormal PFT   . DM (diabetes mellitus)     type II uncontrolled  . Palpitations   . Microalbuminuria   . Microcytic anemia   . DJD (degenerative joint disease)   . Constipation   . CAD (coronary artery disease) 2009    5 stents- #3 in RCA, #1 each in LAD and AVG  . Osteoarthritis   . Low back pain   . HTN (hypertension)   . Hyperlipidemia   . GERD (gastroesophageal reflux disease)   . Allergic rhinitis   . Personal history of colonic polyps   . HOH (hard of hearing)   . MI (myocardial infarction) 52  . Glaucoma   . Diastolic dysfunction, left ventricle 09/12/12  . Cancer 04/2010       . Acute renal failure due  to procedure 09/18/2012  . CKD (chronic kidney disease) stage 3, GFR 30-59 ml/min 09/18/2012  . Heart block 02/2013    transient  . OSA on CPAP   . On home O2     2L N/C continuously  . Emphysema lung   . Complete heart block, transiet 01/26/2013  . Gout    Past Surgical History  Procedure Laterality Date  . Ectopic pregnancy surgery    . Foot surgery      left and right for callous  . Cervical discectomy      L5 left/hemilminectomy  . Colonoscopy  09/2006    int hemmorhoids, COMPLICATED BY CARDIOPULMONARY COMPLICATIONS  . Abdominal hysterectomy  04/2010    uterine cancer,TAHBSO  . Polypectomy  10/22/2011    internal hemorrhoids/sessile polyp  . Coronary angioplasty  1/99, 1/07, 1/08, 4/09    5 cardiac stents total  . Left and right heart catheterization with coronary angiogram N/A 09/12/2012    Procedure: LEFT AND RIGHT HEART CATHETERIZATION WITH CORONARY ANGIOGRAM;  Surgeon: Sanda Klein, MD;  [facility] CATH LAB;  Service: Cardiovascular;  Laterality: N/A;   Social History:  reports that she quit smoking about 8 years ago. Her smoking use included Cigarettes. She started smoking about 60 years ago. She has a 20.4 pack-year smoking history. She has never used smokeless tobacco. She reports that she does not drink alcohol or use illicit drugs.  Allergies  Allergen Reactions  . Morphine Shortness Of Breath and Swelling  . Penicillins Shortness Of Breath and Swelling    Family History  Problem Relation Age of Onset  . Anesthesia problems Neg Hx   . Hypotension Neg Hx   . Malignant hyperthermia Neg Hx   . Pseudochol deficiency Neg Hx     Prior to Admission medications   Medication Sig Start Date End Date Taking? Authorizing Provider  AMLODIPINE BESYLATE PO Take 5 mg by mouth daily.    Historical Provider, MD  aspirin 81 MG EC tablet Take 81 mg by mouth every morning.     Historical Provider, MD  budesonide-formoterol (SYMBICORT) 160-4.5 MCG/ACT inhaler Inhale 2 puffs  into the lungs 2 (two) times daily as needed (for shortness of breath).    Historical Provider, MD  calcitRIOL (ROCALTROL) 0.25 MCG capsule Take 0.25 mcg by mouth daily. Frequency change. Now taking everyday. 06/12/13   Historical Provider, MD  Cholecalciferol (VITAMIN D3) 2000 UNITS TABS Take by mouth daily.    Historical Provider, MD  clobetasol ointment (TEMOVATE) 4.82 % Apply 1 application topically 2 (two) times daily.    Historical Provider, MD  clopidogrel (PLAVIX) 75 MG tablet TAKE 1 TABLET ONCE DAILY. 08/27/14   [name], MD  ezetimibe (ZETIA) 10 MG tablet Take 10 mg by mouth every morning.     Historical Provider, MD  famotidine (PEPCID) 20 MG tablet TAKE (1) TABLET TWICE DAILY. 08/06/14   [name], MD  ferrous sulfate 325 (65 FE) MG tablet Take 325 mg by mouth daily.     Historical Provider, MD  fluconazole (DIFLUCAN) 150 MG tablet Take 1 tablet (150 mg total) by mouth once. 04/08/15   Jonnie Kind, MD  Fluticasone-Salmeterol (ADVAIR DISKUS) 250-50 MCG/DOSE AEPB Inhale 1 puff into the lungs 2 (two) times daily.     Sinda Du, MD  HYDROcodone-acetaminophen (VICODIN) 5-500 MG per tablet Take 1 tablet by mouth 2 (two) times daily as needed for pain.  07/10/13   Historical Provider, MD  insulin aspart (NOVOLOG) 100 UNIT/ML injection Inject 45 Units into the skin 2 (two) times daily. 40 units in the morning and 40 units in the evening.    Historical Provider, MD  insulin glargine (LANTUS) 100 UNIT/ML injection Inject 40 Units into the skin 2 (two) times daily.     Historical Provider, MD  isosorbide mononitrate (IMDUR) 60 MG 24 hr tablet Take 60 mg daily 07/19/14   [name], MD  isosorbide mononitrate (IMDUR) 60 MG 24 hr tablet TAKE 1&1/2 TABLETS BY MOUTH EVERY MORNING AND 1/2 TABLET IN THE EVENING. 01/24/15   [name], MD  levocetirizine (XYZAL) 5 MG tablet Take 5 mg by mouth every evening.    Historical Provider, MD  Lidocaine-Hydrocortisone Ace  3-2.5 % KIT APPLY TO RECTUM QID FOR 2 WEEKS 04/07/15   Danie Binder, MD  Liraglutide (VICTOZA) 18 MG/3ML SOPN Inject 3 mLs into the skin daily. As directed    Historical Provider, MD  lisinopril (PRINIVIL,ZESTRIL) 10 MG tablet Take 1 tablet (10 mg total) by mouth daily. 07/19/14   [name], MD  miconazole (MICATIN) 2 % cream Apply 1 application topically 2 (two) times daily. 04/08/15   Jonnie Kind, MD  montelukast (SINGULAIR) 10 MG tablet Take 10 mg by mouth at bedtime.    Historical Provider, MD  nebivolol (BYSTOLIC) 5 MG tablet Take 1 tab 2 times daily Patient not taking: Reported on 04/14/2015 10/28/14  [name], MD  NITROSTAT 0.4 MG SL tablet PLACE ONE (1) TABLET UNDER TONGUE EVERY 5 MINUTES UP TO (3) DOSES AS NEEDED FOR CHEST PAIN. 03/04/15   Troy Sine, MD  NOVOLOG FLEXPEN 100 UNIT/ML FlexPen USE 25 UNITS AT BREAKFAST, 20 UNITS AT LUNCH AND 35 UNITS AT SUPPER SUB-Q. Patient taking differently: 40 units in the morning 04/28/14   Elayne Snare, MD  nystatin-triamcinolone ointment [facility]) Apply 1 application topically 2 (two) times daily. To affected area. 01/18/14   Jonnie Kind, MD  OXYGEN-HELIUM IN Inhale into the lungs. 2 liters continuous    Historical Provider, MD  POLYETHYLENE GLYCOL 3350-GRX PO Take by mouth as needed.    Historical Provider, MD  rosuvastatin (CRESTOR) 20 MG tablet Take 20 mg by mouth every evening.    Historical Provider, MD  spironolactone (ALDACTONE) 25 MG tablet Take 25 mg by mouth daily.    Historical Provider, MD  tiotropium (SPIRIVA) 18 MCG inhalation capsule Place 18 mcg into inhaler and inhale daily.    Historical Provider, MD  torsemide (DEMADEX) 20 MG tablet Take 1 tablet (20 mg total) by mouth 2 (two) times daily. Patient taking differently: Take 20 mg by mouth daily.  04/07/14   [name], MD   Physical Exam: Filed Vitals:   04/14/15 1717  BP: 124/73  Pulse: 74  Temp: 98.1 F (36.7 C)  TempSrc: Oral  Resp: 20    Height: '5\' 7"'  (1.702 m)  Weight: 87.181 kg (192 lb 3.2 oz)  SpO2: 100%    Wt Readings from Last 3 Encounters:  04/14/15 87.181 kg (192 lb 3.2 oz)  04/14/15 88.451 kg (195 lb)  04/12/15 88.451 kg (195 lb)    General:  Appears somewhat clinically dehydrated.  Eyes: PERRL, normal lids, irises & conjunctiva ENT: grossly normal hearing, lips & tongue Neck: no LAD, masses or thyromegaly Cardiovascular: RRR, no m/r/g. No LE edema. Telemetry: SR, no arrhythmias  Respiratory: CTA bilaterally, no w/r/r. Normal respiratory effort. Abdomen: soft, ntnd Skin: no rash or induration seen on limited exam Musculoskeletal: grossly normal tone BUE/BLE Psychiatric: grossly normal mood and affect, speech fluent and appropriate Neurologic: grossly non-focal.          Labs on Admission:  Basic Metabolic Panel:  Recent Labs Lab 04/14/15 1733  NA 137  K 4.1  CL 93*  CO2 31  GLUCOSE 107*  BUN 66*  CREATININE 3.08*  CALCIUM 10.4*   Liver Function Tests:  Recent Labs Lab 04/14/15 1733  AST 20  ALT 18  ALKPHOS 73  BILITOT 0.5  PROT 8.2*  ALBUMIN 4.3   No results for input(s): LIPASE, AMYLASE in the last 168 hours. No results for input(s): AMMONIA in the last 168 hours. CBC:  Recent Labs Lab 04/14/15 1733  WBC 9.4  HGB 11.9*  HCT 38.5  MCV 76.2*  PLT 332   Cardiac Enzymes: No results for input(s): CKTOTAL, CKMB, CKMBINDEX, TROPONINI in the last 168 hours.  BNP (last 3 results) No results for input(s): BNP in the last 8760 hours.  ProBNP (last 3 results) No results for input(s): PROBNP in the last 8760 hours.  CBG: No results for input(s): GLUCAP in the last 168 hours.  Radiological Exams on Admission: No results found.    Assessment/Plan  1.Dizziness.This is secondary to Hypovolemia. I will hold diuretics for the time being and give her gentle IV fluids. Diuretics can be of any initiated once her creatinine returns to the 2.0 range. 2. Diabetes.  Continue with  home insulin and sliding scale. 3.Chronic kidney disease. Monitor renal function closely.     Code Status: Full code.    DVT Prophylaxis: heparin.  Family Communication: I discussed the plan with the patient at the bedside.    Disposition Plan: home when medically stable.    Time spent: 60 minutes.   Doree Albee [facility] Pager [phone].

## 2015-04-14 NOTE — Progress Notes (Signed)
Multiple attempts to start Pt's IV made by multiple nurses without access. Dr. Anastasio Champion paged and informed of inability to gain IV access. Encourage pt to take in oral fluids throughout the night and reassess in AM.

## 2015-04-14 NOTE — Progress Notes (Signed)
Cardiology Office Note   Date:  04/14/2015   ID:  Krystal [name], DOB 1936/07/01, MRN [id number]  PCP:  [name], MD  Cardiologist: Woodroe Chen, NP   Chief Complaint  Patient presents with  . Congestive Heart Failure    Diastolic  . Hypertension  . Coronary Artery Disease    Multiple stents      History of Present Illness: Krystal [name] is a 79 y.o. female who presents for ongoing assessment and management of chronic diastolic CHF, hypertension, CAD, with hx of COPD, IDDN. And OSA. She was last seen in the office on 2.23/2016 with complaints of recurrent chest pain alleviated with NTG SL. She was continued on torsemide, She was continued on medical therapy, and was referred to pulmonology for CPAP titration study with hx of OSA.   She comes today feeling weak, dizzy, lightheaded, blurred vision with complaints of near syncope and chest pain with palpitations. She has been taking both torsemide and lasix at home along with spironolactone. She is tearful and afraid of being at home with her near syncope, as she lives alone. She states that chest discomfort is on the left side, radiating to her left harm. She has taken NTG for this with good result. She feels that "something is wrong." She states she is taking the medication as she was instructed.     Past Medical History  Diagnosis Date  . Sleep apnea     on C-pap  . Occult blood in stools   . Myalgia   . Abnormal PFT   . DM (diabetes mellitus)     type II uncontrolled  . Palpitations   . Microalbuminuria   . Microcytic anemia   . DJD (degenerative joint disease)   . Constipation   . CAD (coronary artery disease) 2009    5 stents- #3 in RCA, #1 each in LAD and AVG  . Osteoarthritis   . Low back pain   . HTN (hypertension)   . Hyperlipidemia   . GERD (gastroesophageal reflux disease)   . Allergic rhinitis   . Personal history of colonic polyps   . HOH (hard of hearing)   . MI (myocardial  infarction) 35  . Glaucoma   . Diastolic dysfunction, left ventricle 09/12/12  . Cancer 04/2010       . Acute renal failure due to procedure 09/18/2012  . CKD (chronic kidney disease) stage 3, GFR 30-59 ml/min 09/18/2012  . Heart block 02/2013    transient  . OSA on CPAP   . On home O2     2L N/C continuously  . Emphysema lung   . Complete heart block, transiet 01/26/2013  . Gout     Past Surgical History  Procedure Laterality Date  . Ectopic pregnancy surgery    . Foot surgery      left and right for callous  . Cervical discectomy      L5 left/hemilminectomy  . Colonoscopy  09/2006    int hemmorhoids, COMPLICATED BY CARDIOPULMONARY COMPLICATIONS  . Abdominal hysterectomy  04/2010    uterine cancer,TAHBSO  . Polypectomy  10/22/2011    internal hemorrhoids/sessile polyp  . Coronary angioplasty  1/99, 1/07, 1/08, 4/09    5 cardiac stents total  . Left and right heart catheterization with coronary angiogram N/A 09/12/2012    Procedure: LEFT AND RIGHT HEART CATHETERIZATION WITH CORONARY ANGIOGRAM;  Surgeon: Sanda Klein, MD;  Location: [facility] CATH LAB;  Service: Cardiovascular;  Laterality: N/A;  Current Outpatient Prescriptions  Medication Sig Dispense Refill  . AMLODIPINE BESYLATE PO Take 5 mg by mouth daily.    Marland Kitchen aspirin 81 MG EC tablet Take 81 mg by mouth every morning.     . budesonide-formoterol (SYMBICORT) 160-4.5 MCG/ACT inhaler Inhale 2 puffs into the lungs 2 (two) times daily as needed (for shortness of breath).    . calcitRIOL (ROCALTROL) 0.25 MCG capsule Take 0.25 mcg by mouth daily. Frequency change. Now taking everyday.    . Cholecalciferol (VITAMIN D3) 2000 UNITS TABS Take by mouth daily.    . clobetasol ointment (TEMOVATE) 7.61 % Apply 1 application topically 2 (two) times daily.    . clopidogrel (PLAVIX) 75 MG tablet TAKE 1 TABLET ONCE DAILY. 30 tablet 6  . ezetimibe (ZETIA) 10 MG tablet Take 10 mg by mouth every morning.     . famotidine (PEPCID) 20 MG tablet  TAKE (1) TABLET TWICE DAILY. 60 tablet 3  . ferrous sulfate 325 (65 FE) MG tablet Take 325 mg by mouth daily.     . fluconazole (DIFLUCAN) 150 MG tablet Take 1 tablet (150 mg total) by mouth once. 2 tablet 2  . Fluticasone-Salmeterol (ADVAIR DISKUS) 250-50 MCG/DOSE AEPB Inhale 1 puff into the lungs 2 (two) times daily.     Marland Kitchen HYDROcodone-acetaminophen (VICODIN) 5-500 MG per tablet Take 1 tablet by mouth 2 (two) times daily as needed for pain.     Marland Kitchen insulin aspart (NOVOLOG) 100 UNIT/ML injection Inject 45 Units into the skin 2 (two) times daily. 40 units in the morning and 40 units in the evening.    . insulin glargine (LANTUS) 100 UNIT/ML injection Inject 40 Units into the skin 2 (two) times daily.     . isosorbide mononitrate (IMDUR) 60 MG 24 hr tablet Take 60 mg daily 60 tablet 3  . isosorbide mononitrate (IMDUR) 60 MG 24 hr tablet TAKE 1&1/2 TABLETS BY MOUTH EVERY MORNING AND 1/2 TABLET IN THE EVENING. 60 tablet 6  . levocetirizine (XYZAL) 5 MG tablet Take 5 mg by mouth every evening.    . Lidocaine-Hydrocortisone Ace 3-2.5 % KIT APPLY TO RECTUM QID FOR 2 WEEKS 1 each 0  . Liraglutide (VICTOZA) 18 MG/3ML SOPN Inject 3 mLs into the skin daily. As directed    . lisinopril (PRINIVIL,ZESTRIL) 10 MG tablet Take 1 tablet (10 mg total) by mouth daily. 90 tablet 3  . miconazole (MICATIN) 2 % cream Apply 1 application topically 2 (two) times daily. 28.35 g 0  . montelukast (SINGULAIR) 10 MG tablet Take 10 mg by mouth at bedtime.    . nebivolol (BYSTOLIC) 5 MG tablet Take 1 tab 2 times daily 60 tablet 3  . NITROSTAT 0.4 MG SL tablet PLACE ONE (1) TABLET UNDER TONGUE EVERY 5 MINUTES UP TO (3) DOSES AS NEEDED FOR CHEST PAIN. 25 tablet 0  . NOVOLOG FLEXPEN 100 UNIT/ML FlexPen USE 25 UNITS AT BREAKFAST, 20 UNITS AT LUNCH AND 35 UNITS AT SUPPER SUB-Q. (Patient taking differently: 40 units in the morning) 30 mL 2  . nystatin-triamcinolone ointment (MYCOLOG) Apply 1 application topically 2 (two) times daily. To  affected area. 30 g prn  . OXYGEN-HELIUM IN Inhale into the lungs. 2 liters continuous    . POLYETHYLENE GLYCOL 3350-GRX PO Take by mouth as needed.    . rosuvastatin (CRESTOR) 20 MG tablet Take 20 mg by mouth every evening.    Marland Kitchen spironolactone (ALDACTONE) 25 MG tablet Take 25 mg by mouth daily.    Marland Kitchen tiotropium (  SPIRIVA) 18 MCG inhalation capsule Place 18 mcg into inhaler and inhale daily.    Marland Kitchen torsemide (DEMADEX) 20 MG tablet Take 1 tablet (20 mg total) by mouth 2 (two) times daily. 180 tablet 3   No current facility-administered medications for this visit.    Allergies:   Morphine and Penicillins    Social History:  The patient  reports that she quit smoking about 8 years ago. Her smoking use included Cigarettes. She started smoking about 60 years ago. She has a 20.4 pack-year smoking history. She has never used smokeless tobacco. She reports that she does not drink alcohol or use illicit drugs.   Family History:  The patient's family history is negative for Anesthesia problems, Hypotension, Malignant hyperthermia, and Pseudochol deficiency.    ROS: .   All other systems are reviewed and negative.Unless otherwise mentioned in H&P above.   PHYSICAL EXAM: VS:  There were no vitals taken for this visit. , BMI There is no weight on file to calculate BMI. GEN: Well nourished, well developed, in no acute distress HEENT: normal Neck: no JVD, carotid bruits, or masses Cardiac: RRR; no murmurs, rubs, or gallops,no edema  Respiratory:  clear to auscultation bilaterally, normal work of breathing GI: soft, nontender, nondistended, + BS MS: no deformity or atrophy Skin: warm and dry, no rash Neuro:  Strength and sensation are intact Psych: euthymic mood, full affect   Recent Labs: 11/01/2014: ALT 13; BUN 44*; Creatinine 2.33*; Potassium 4.2; Sodium 136; TSH 3.950    Lipid Panel    Component Value Date/Time   CHOL 191 12/18/2013 1058   TRIG 186* 12/18/2013 1058   HDL 39* 12/18/2013  1058   CHOLHDL 4.9 12/18/2013 1058   VLDL 37 12/18/2013 1058   LDLCALC 115* 12/18/2013 1058   LDLDIRECT 118.4 08/13/2013 1502      Wt Readings from Last 3 Encounters:  04/12/15 195 lb (88.451 kg)  04/08/15 197 lb (89.359 kg)  03/29/15 195 lb (88.451 kg)      Other studies Reviewed: Additional studies/ records that were reviewed today include: None Review of the above records demonstrates: N/A   ASSESSMENT AND PLAN:  1. Hypotension with near syncope: Maybe related to overuse of diuretic's as she is taking both the spironolactone, lasix and demedex at home. She may be dehydrated. Will admit have labs, hydration and medication adjustments if necessary. I have discussed this with Dr. Domenic Polite and Dr. Anastasio Champion to be admitted to telemetry. We will follow along.  2. CAD: Multiple stents (5 stents to RCA, 1 stent each in LAD, and AVG).She is having chest pain. Will admit and check cardiac enzymes, rule out ischemia causung her symptoms. Adjust medications as necessary as results of tests are evaluated.   3. Diastolic Dysfunction: Hold off on diuretics until labs are evaluated. My need hydration. Will repeat echo.   4. CKD:  Stage 3: Depending on test results, may need nephrology consultation.  5. OSA: Will need CPAP as she uses at home. PTH to order.   6. Diabetes: Management per PTH.   Current medicines are reviewed at length with the patient today.    Labs/ tests ordered today include:N/A No orders of the defined types were placed in this encounter.     Disposition:  ADMIT. Will see on consultation  Signed, Jory Sims, NP  04/14/2015 7:18 AM    [facility] [address], [place], [place] [address] Phone: [phone]; Fax: [phone]

## 2015-04-15 ENCOUNTER — Inpatient Hospital Stay (HOSPITAL_BASED_OUTPATIENT_CLINIC_OR_DEPARTMENT_OTHER): Payer: Medicare Other

## 2015-04-15 ENCOUNTER — Encounter (HOSPITAL_COMMUNITY): Payer: Self-pay | Admitting: Internal Medicine

## 2015-04-15 DIAGNOSIS — G4733 Obstructive sleep apnea (adult) (pediatric): Secondary | ICD-10-CM

## 2015-04-15 DIAGNOSIS — N183 Chronic kidney disease, stage 3 (moderate): Secondary | ICD-10-CM

## 2015-04-15 DIAGNOSIS — Z794 Long term (current) use of insulin: Secondary | ICD-10-CM

## 2015-04-15 DIAGNOSIS — I5032 Chronic diastolic (congestive) heart failure: Secondary | ICD-10-CM | POA: Diagnosis present

## 2015-04-15 DIAGNOSIS — J9611 Chronic respiratory failure with hypoxia: Secondary | ICD-10-CM | POA: Diagnosis present

## 2015-04-15 DIAGNOSIS — N189 Chronic kidney disease, unspecified: Secondary | ICD-10-CM

## 2015-04-15 DIAGNOSIS — N179 Acute kidney failure, unspecified: Secondary | ICD-10-CM

## 2015-04-15 DIAGNOSIS — J449 Chronic obstructive pulmonary disease, unspecified: Secondary | ICD-10-CM | POA: Diagnosis present

## 2015-04-15 DIAGNOSIS — N184 Chronic kidney disease, stage 4 (severe): Secondary | ICD-10-CM

## 2015-04-15 DIAGNOSIS — E1121 Type 2 diabetes mellitus with diabetic nephropathy: Secondary | ICD-10-CM

## 2015-04-15 DIAGNOSIS — R55 Syncope and collapse: Secondary | ICD-10-CM | POA: Diagnosis present

## 2015-04-15 DIAGNOSIS — I2510 Atherosclerotic heart disease of native coronary artery without angina pectoris: Secondary | ICD-10-CM

## 2015-04-15 DIAGNOSIS — I10 Essential (primary) hypertension: Secondary | ICD-10-CM | POA: Diagnosis present

## 2015-04-15 DIAGNOSIS — I25119 Atherosclerotic heart disease of native coronary artery with unspecified angina pectoris: Secondary | ICD-10-CM

## 2015-04-15 DIAGNOSIS — E1122 Type 2 diabetes mellitus with diabetic chronic kidney disease: Secondary | ICD-10-CM | POA: Diagnosis present

## 2015-04-15 LAB — CBC
HCT: 36.2 % (ref 36.0–46.0)
Hemoglobin: 11 g/dL — ABNORMAL LOW (ref 12.0–15.0)
MCH: 23 pg — ABNORMAL LOW (ref 26.0–34.0)
MCHC: 30.4 g/dL (ref 30.0–36.0)
MCV: 75.7 fL — AB (ref 78.0–100.0)
Platelets: 316 10*3/uL (ref 150–400)
RBC: 4.78 MIL/uL (ref 3.87–5.11)
RDW: 15.5 % (ref 11.5–15.5)
WBC: 8.6 10*3/uL (ref 4.0–10.5)

## 2015-04-15 LAB — GLUCOSE, CAPILLARY
Glucose-Capillary: 194 mg/dL — ABNORMAL HIGH (ref 65–99)
Glucose-Capillary: 218 mg/dL — ABNORMAL HIGH (ref 65–99)
Glucose-Capillary: 259 mg/dL — ABNORMAL HIGH (ref 65–99)
Glucose-Capillary: 291 mg/dL — ABNORMAL HIGH (ref 65–99)

## 2015-04-15 LAB — TROPONIN I
Troponin I: <0.03 ng/mL
Troponin I: <0.03 ng/mL (ref <0.031)

## 2015-04-15 LAB — BASIC METABOLIC PANEL WITH GFR
Anion gap: 15 (ref 5–15)
BUN: 64 mg/dL — ABNORMAL HIGH (ref 6–20)
CO2: 31 mmol/L (ref 22–32)
Calcium: 9.6 mg/dL (ref 8.9–10.3)
Chloride: 91 mmol/L — ABNORMAL LOW (ref 101–111)
Creatinine, Ser: 2.89 mg/dL — ABNORMAL HIGH (ref 0.44–1.00)
GFR calc Af Amer: 17 mL/min — ABNORMAL LOW
GFR calc non Af Amer: 15 mL/min — ABNORMAL LOW
Glucose, Bld: 187 mg/dL — ABNORMAL HIGH (ref 65–99)
Potassium: 3.9 mmol/L (ref 3.5–5.1)
Sodium: 137 mmol/L (ref 135–145)

## 2015-04-15 LAB — TSH: TSH: 2.617 u[IU]/mL (ref 0.350–4.500)

## 2015-04-15 MED ORDER — NYSTATIN-TRIAMCINOLONE 100000-0.1 UNIT/GM-% EX CREA
TOPICAL_CREAM | Freq: Two times a day (BID) | CUTANEOUS | Status: DC
  Administered 2015-04-15: 1 via TOPICAL
  Administered 2015-04-15 – 2015-04-16 (×2): via TOPICAL
  Administered 2015-04-16: 1 via TOPICAL
  Administered 2015-04-17 – 2015-04-18 (×3): via TOPICAL
  Filled 2015-04-15: qty 15

## 2015-04-15 MED ORDER — CLOTRIMAZOLE 1 % EX CREA
TOPICAL_CREAM | Freq: Two times a day (BID) | CUTANEOUS | Status: DC
  Administered 2015-04-15: 11:00:00 via TOPICAL
  Administered 2015-04-15: 1 via TOPICAL
  Administered 2015-04-16: 10:00:00 via TOPICAL
  Administered 2015-04-16: 1 via TOPICAL
  Administered 2015-04-17 – 2015-04-18 (×3): via TOPICAL
  Filled 2015-04-15: qty 15

## 2015-04-15 MED ORDER — INSULIN GLARGINE 100 UNIT/ML ~~LOC~~ SOLN: 20.0000 [IU] | Freq: Two times a day (BID) | SUBCUTANEOUS | Status: DC

## 2015-04-15 MED ORDER — INSULIN GLARGINE 100 UNIT/ML ~~LOC~~ SOLN
10.0000 [IU] | Freq: Two times a day (BID) | SUBCUTANEOUS | Status: DC
  Administered 2015-04-15 – 2015-04-16 (×3): 10 [IU] via SUBCUTANEOUS
  Filled 2015-04-15 (×7): qty 0.1

## 2015-04-15 NOTE — Progress Notes (Signed)
Primary cardiologist: Dr. Kate Sable  Seen for followup: Dizziness, weakness  Subjective:    No chest pain or palpitations, no shortness of breath at rest. No dizziness while supine.  Objective:   Temp:  [98.1 F (36.7 C)-98.6 F (37 C)] 98.2 F (36.8 C) (05/27 0502) Pulse Rate:  [74-80] 74 (05/27 0502) Resp:  [20] 20 (05/27 0502) BP: (118-150)/(41-73) 150/60 mmHg (05/27 0502) SpO2:  [91 %-100 %] 94 % (05/27 0502) Weight:  [192 lb (87.091 kg)-195 lb (88.451 kg)] 192 lb (87.091 kg) (05/27 0510) Last BM Date: 04/13/15  Filed Weights   04/14/15 1717 04/15/15 0510  Weight: 192 lb 3.2 oz (87.181 kg) 192 lb (87.091 kg)    Intake/Output Summary (Last 24 hours) at 04/15/15 0924 Last data filed at 04/15/15 0900  Gross per 24 hour  Intake    240 ml  Output      0 ml  Net    240 ml    Telemetry: Sinus rhythm with occasional PVCs. No pauses.  Exam:  General: No distress.  Lungs: Clear, nonlabored.  Cardiac: RRR without gallop.  Abdomen: NABS.  Extremities: No pitting edema.  Lab Results:  Basic Metabolic Panel:  Recent Labs Lab 04/14/15 1733 04/15/15 0614  NA 137 137  K 4.1 3.9  CL 93* 91*  CO2 31 31  GLUCOSE 107* 187*  BUN 66* 64*  CREATININE 3.08* 2.89*  CALCIUM 10.4* 9.6    Liver Function Tests:  Recent Labs Lab 04/14/15 1733  AST 20  ALT 18  ALKPHOS 73  BILITOT 0.5  PROT 8.2*  ALBUMIN 4.3    CBC:  Recent Labs Lab 04/14/15 1733 04/15/15 0614  WBC 9.4 8.6  HGB 11.9* 11.0*  HCT 38.5 36.2  MCV 76.2* 75.7*  PLT 332 316    Cardiac Enzymes:  Recent Labs Lab 04/14/15 1733 04/15/15 0010 04/15/15 0614  TROPONINI <0.03 <0.03 <0.03     Medications:   Scheduled Medications: . amLODipine  5 mg Oral Daily  . aspirin EC  81 mg Oral q morning - 10a  . calcitRIOL  0.25 mcg Oral Daily  . clobetasol ointment  1 application Topical BID  . clopidogrel  75 mg Oral Daily  . clotrimazole   Topical BID  . ezetimibe  10 mg Oral q  morning - 10a  . famotidine  20 mg Oral Daily  . ferrous sulfate  325 mg Oral Daily  . heparin  5,000 Units Subcutaneous 3 times per day  . insulin aspart  0-15 Units Subcutaneous TID WC  . insulin aspart  0-5 Units Subcutaneous QHS  . insulin glargine  10 Units Subcutaneous BID  . isosorbide mononitrate  60 mg Oral Daily  . Liraglutide  3 mL Subcutaneous Daily  . lisinopril  10 mg Oral Daily  . loratadine  10 mg Oral QHS  . mometasone-formoterol  2 puff Inhalation BID  . montelukast  10 mg Oral QHS  . nebivolol  5 mg Oral BID AC  . nystatin-triamcinolone   Topical BID  . rosuvastatin  20 mg Oral QPM  . sodium chloride  3 mL Intravenous Q12H  . tiotropium  18 mcg Inhalation Daily     Infusions: . sodium chloride       PRN Medications:  budesonide-formoterol, HYDROcodone-acetaminophen, nitroGLYCERIN, ondansetron **OR** ondansetron (ZOFRAN) IV   Assessment:   1. Patient presents with description of orthostatic dizziness and weakness over the last few months, generalized fatigue. At home she was on a combination  of Demadex, Lasix, and Aldactone. She has evidence of acute on chronic renal failure, creatinine up to 3.1 from 2.3 previously.  2. Ischemic heart disease status post multiple prior coronary interventions with stents in the LAD, circumflex, and RCA. Last cardiac catheterization was in 2013 demonstrating patent stent sites with mild to moderate in-stent restenosis and otherwise diffuse disease that was nonobstructive. She has been managed medically, does report some increased episodes of angina in the last month. Cardiac enzymes are normal.  3. History of diastolic heart failure, last echocardiogram was in April 2015.  4. Obstructive sleep apnea on CPAP, patient reports compliance.  5. Emphysematous lung disease, on supplemental oxygen at home.   Plan/Discussion:    Follow-up echocardiogram to reassess cardiac structure and function. Agree with holding diuretics  temporarily, although will need to resume at least one eventually, possibly Demadex. Check orthostatics. Otherwise continue aspirin, Plavix, Norvasc, Zetia, Crestor, Bystolic, and lisinopril (as long as renal function continues to improve to baseline). She will need to have a follow-up ischemic evaluation to reassess ischemic burden, possibly Lexiscan Cardiolite once her volume status is regulated and her medications are stable. This may be able to be done as an outpatient with follow-up per Dr. Bronson Ing.  Satira Sark, M.D., F.A.C.C.

## 2015-04-15 NOTE — Progress Notes (Addendum)
[facility] PROGRESS NOTE  Krystal [name] IRJ:[id number] DOB: 01-Nov-1936 DOA: 04/14/2015 PCP: [name], MD    Code Status: Full code Family Communication: Discussed with patient; family not available Disposition Plan: Discharge when clinically appropriate   Consultants:  Cardiology  Procedures:  None  Antibiotics:  None  HPI/Subjective: The patient's says that she feels a little lightheaded and does not feel like herself. She denies chest pain or shortness of breath.  Objective: Filed Vitals:   04/15/15 0502  BP: 150/60  Pulse: 74  Temp: 98.2 F (36.8 C)  Resp: 20   oxygen saturation 94%.  Intake/Output Summary (Last 24 hours) at 04/15/15 1022 Last data filed at 04/15/15 0900  Gross per 24 hour  Intake    240 ml  Output      0 ml  Net    240 ml   Filed Weights   04/14/15 1717 04/15/15 0510  Weight: 87.181 kg (192 lb 3.2 oz) 87.091 kg (192 lb)    Exam:   General:  Alert 79 year old woman in no acute distress.  Cardiovascular: S1, S2, with a soft systolic murmur.  Respiratory: Clear to auscultation bilaterally.  Abdomen: Positive bowel sounds, soft, nontender, nondistended.  Musculoskeletal/extremities: No pedal edema. No acute hot red joints.  Neurologic: She is alert and oriented 3. Cranial nerves II through XII are intact. No nystagmus. No pronator drift.  Data Reviewed: Basic Metabolic Panel:  Recent Labs Lab 04/14/15 1733 04/15/15 0614  NA 137 137  K 4.1 3.9  CL 93* 91*  CO2 31 31  GLUCOSE 107* 187*  BUN 66* 64*  CREATININE 3.08* 2.89*  CALCIUM 10.4* 9.6   Liver Function Tests:  Recent Labs Lab 04/14/15 1733  AST 20  ALT 18  ALKPHOS 73  BILITOT 0.5  PROT 8.2*  ALBUMIN 4.3   No results for input(s): LIPASE, AMYLASE in the last 168 hours. No results for input(s): AMMONIA in the last 168 hours. CBC:  Recent Labs Lab 04/14/15 1733 04/15/15 0614  WBC 9.4 8.6  HGB 11.9* 11.0*  HCT 38.5 36.2  MCV 76.2*  75.7*  PLT 332 316   Cardiac Enzymes:  Recent Labs Lab 04/14/15 1733 04/15/15 0010 04/15/15 0614  TROPONINI <0.03 <0.03 <0.03   BNP (last 3 results) No results for input(s): BNP in the last 8760 hours.  ProBNP (last 3 results) No results for input(s): PROBNP in the last 8760 hours.  CBG:  Recent Labs Lab 04/14/15 2150 04/15/15 0722  GLUCAP 209* 194*    No results found for this or any previous visit (from the past 240 hour(s)).   Studies: No results found.  Scheduled Meds: . amLODipine  5 mg Oral Daily  . aspirin EC  81 mg Oral q morning - 10a  . calcitRIOL  0.25 mcg Oral Daily  . clobetasol ointment  1 application Topical BID  . clopidogrel  75 mg Oral Daily  . clotrimazole   Topical BID  . ezetimibe  10 mg Oral q morning - 10a  . famotidine  20 mg Oral Daily  . ferrous sulfate  325 mg Oral Daily  . heparin  5,000 Units Subcutaneous 3 times per day  . insulin aspart  0-15 Units Subcutaneous TID WC  . insulin aspart  0-5 Units Subcutaneous QHS  . insulin glargine  10 Units Subcutaneous BID  . isosorbide mononitrate  60 mg Oral Daily  . Liraglutide  3 mL Subcutaneous Daily  . lisinopril  10 mg Oral Daily  .  loratadine  10 mg Oral QHS  . mometasone-formoterol  2 puff Inhalation BID  . montelukast  10 mg Oral QHS  . nebivolol  5 mg Oral BID AC  . nystatin-triamcinolone   Topical BID  . rosuvastatin  20 mg Oral QPM  . sodium chloride  3 mL Intravenous Q12H  . tiotropium  18 mcg Inhalation Daily   Continuous Infusions: . sodium chloride     Assessment and plan:  Principal Problem:   Near syncope Active Problems:   Acute kidney injury   Coronary atherosclerosis   Chronic diastolic heart failure   Type II diabetes mellitus with nephropathy   Deficiency anemia   Obstructive sleep apnea   CKD (chronic kidney disease) stage 3, GFR 30-59 ml/min   Essential hypertension   Chronic respiratory failure with hypoxia   COPD with chronic bronchitis   1.  Near syncope. The patient was evaluated by cardiology prior to hospital admission and was found to have symptomatic lightheadedness. Cardiology nurse practitioner, Ms. Lawrence noted that the patient may have had overuse of her diuretic therapy including spironolactone, Lasix, and Demadex. Her blood pressure was apparently on the low end of normal in the office. On admission, the patient was noted to have a low-normal blood pressure and with the BUN of 66 and a creatinine of 3.08, both elevated above baseline. Orthostatic vital signs are pending, but the etiology is likely from prerenal azotemia/dehydration and orthostatic hypotension. Patient has no neurological findings on exam. -Apparently IV access was lost, so the patient did not receive IV fluids overnight. -We'll order a PICC line if the nursing staff cannot secure a peripheral IV for IV fluids. -We'll order TSH and vitamin B12 level for further evaluation.  Acute kidney injury superimposed on stage III chronic kidney disease. The patient's baseline creatinine is 2.0-2.3. On admission, it was 3.08. This is likely associated with combination diuretic therapy leading to prerenal azotemia. Diuretic therapy is currently on hold. IV fluids were started, but IV access was lost. As above, will order a PICC line if the nursing staff cannot secure a peripheral IV for IV fluids.  Essential hypertension and Ischemic heart disease/CAD with history of coronary interventions with stents. Patient is without chest pain. Her troponin I is negative 3. Continue amlodipine, Plavix, Imdur, lisinopril, Bystolic, and Crestor. Cardiology consulted and appreciate their input.  Diastolic heart failure. Currently stable and compensated.  Type 2 diabetes mellitus with nephropathy. The patient is treated with Liraglutide, 45 units of NovoLog twice a day and 40 units of Lantus twice a day. In the setting of worsening renal function, the doses have been decreased.  We'll continue to adjust appropriately.  Obstructive sleep apnea/oxygen dependent COPD with bronchitis and emphysema. Currently stable. We'll continue oxygen, nightly CPAP, Singulair, Spiriva, and Dulera        Time spent: 35 minutes    [facility] Pager [phone]. If 7PM-7AM, please contact night-coverage at [email or web address], password [facility] 04/15/2015, 10:22 AM  LOS: 1 day

## 2015-04-15 NOTE — Progress Notes (Signed)
UR chart review completed.  

## 2015-04-15 NOTE — Progress Notes (Signed)
Inpatient Diabetes Program Recommendations  AACE/ADA: New Consensus Statement on Inpatient Glycemic Control (2013)  Target Ranges:  Prepandial:   less than 140 mg/dL      Peak postprandial:   less than 180 mg/dL (1-2 hours)      Critically ill patients:  140 - 180 mg/dL    Results for Krystal [name], Krystal [name] (MRN [id number]) as of 04/15/2015 08:45  Ref. Range 04/14/2015 21:50 04/15/2015 07:22  Glucose-Capillary Latest Ref Range: 65-99 mg/dL 209 (H) 194 (H)   Reason for Visit: Dizziness  Diabetes history: DM 2 Outpatient Diabetes medications: Lantus 40 units BID, Victoza 18 mg Daily Current orders for Inpatient glycemic control: Victoza 18 mg Daily, Lantus 10 units BID, Novolog 0-15 TID, Novolog 0-5 units QHS  Inpatient Diabetes Program Recommendations Insulin - Basal: Patient received 40 units of Lantus last pm and was in the 190's this am. Adjustment to basal insulin done this am. Please consider increasing basal insulin back up to patient's home dose, Lantus 40 units BID. Correction (SSI): If glucose is still elevated with home dose of basal may need to increase correction.  Thanks,  Tama Headings RN, MSN, [facility] Inpatient Diabetes Coordinator Team Pager [phone]

## 2015-04-15 NOTE — Care Management Note (Signed)
Case Management Note  Patient Details  Name: Krystal [name] MRN: [id number] Date of Birth: 09-30-1936  Subjective/Objective:                  Pt admitted from home with syncope and dizziness. Pt lives alone and will return home at discharge. Pt has a cane and home O2 with [facility].   Action/Plan: Pt would like HH RN and aide at discharge. Weekend staff to call and fax orders when written.  Expected Discharge Date:                  Expected Discharge Plan:  [place]  In-House Referral:  NA  Discharge planning Services  CM Consult  Post Acute Care Choice:  Home Health Choice offered to:  Patient  DME Arranged:    DME Agency:     HH Arranged:  RN, Nurse's Aide [place] Agency:  [facility]  Status of Service:  Completed, signed off  Medicare Important Message Given:  Yes Date Medicare IM Given:  04/15/15 Medicare IM give by:  Christinia Gully, RN BSN CM Date Additional Medicare IM Given:    Additional Medicare Important Message give by:     If discussed at [place] of Stay Meetings, dates discussed:    Additional Comments:  Joylene Draft, RN 04/15/2015, 2:35 PM

## 2015-04-16 ENCOUNTER — Inpatient Hospital Stay (HOSPITAL_COMMUNITY): Payer: Medicare Other

## 2015-04-16 DIAGNOSIS — J9611 Chronic respiratory failure with hypoxia: Secondary | ICD-10-CM

## 2015-04-16 DIAGNOSIS — I5032 Chronic diastolic (congestive) heart failure: Secondary | ICD-10-CM

## 2015-04-16 LAB — BASIC METABOLIC PANEL
ANION GAP: 12 (ref 5–15)
BUN: 59 mg/dL — ABNORMAL HIGH (ref 6–20)
CO2: 29 mmol/L (ref 22–32)
Calcium: 9.3 mg/dL (ref 8.9–10.3)
Chloride: 96 mmol/L — ABNORMAL LOW (ref 101–111)
Creatinine, Ser: 2.45 mg/dL — ABNORMAL HIGH (ref 0.44–1.00)
GFR calc non Af Amer: 18 mL/min — ABNORMAL LOW (ref >60)
GFR, EST AFRICAN AMERICAN: 21 mL/min — AB (ref >60)
GLUCOSE: 266 mg/dL — AB (ref 65–99)
POTASSIUM: 4 mmol/L (ref 3.5–5.1)
Sodium: 137 mmol/L (ref 135–145)

## 2015-04-16 LAB — GLUCOSE, CAPILLARY
GLUCOSE-CAPILLARY: 273 mg/dL — AB (ref 65–99)
GLUCOSE-CAPILLARY: 260 mg/dL — AB (ref 65–99)
Glucose-Capillary: 204 mg/dL — ABNORMAL HIGH (ref 65–99)
Glucose-Capillary: 247 mg/dL — ABNORMAL HIGH (ref 65–99)

## 2015-04-16 LAB — CBC
HCT: 36 % (ref 36.0–46.0)
Hemoglobin: 10.8 g/dL — ABNORMAL LOW (ref 12.0–15.0)
MCH: 23 pg — AB (ref 26.0–34.0)
MCHC: 30 g/dL (ref 30.0–36.0)
MCV: 76.6 fL — ABNORMAL LOW (ref 78.0–100.0)
Platelets: 281 10*3/uL (ref 150–400)
RBC: 4.7 MIL/uL (ref 3.87–5.11)
RDW: 15.5 % (ref 11.5–15.5)
WBC: 7.1 10*3/uL (ref 4.0–10.5)

## 2015-04-16 MED ORDER — INSULIN GLARGINE 100 UNIT/ML ~~LOC~~ SOLN
30.0000 [IU] | Freq: Two times a day (BID) | SUBCUTANEOUS | Status: DC
  Administered 2015-04-16 – 2015-04-17 (×2): 30 [IU] via SUBCUTANEOUS
  Filled 2015-04-16 (×4): qty 0.3

## 2015-04-16 NOTE — Progress Notes (Signed)
[facility] PROGRESS NOTE  DEWEY NEUKAM PFX:[id number] DOB: Aug 24, 1936 DOA: 04/14/2015 PCP: [name], MD    Code Status: Full code Family Communication: Discussed with patient; family not available Disposition Plan: Discharge when clinically appropriate   Consultants:  Cardiology  Procedures:  None  Antibiotics:  None  HPI/Subjective: The patient's says she feels a little stronger and less dizzy today than yesterday. She doesn't feel like she is back to her baseline.  Objective: Filed Vitals:   04/16/15 1111  BP: 142/61  Pulse: 82  Temp: 98.2 F (36.8 C)  Resp: 20   oxygen saturation 97%.  Intake/Output Summary (Last 24 hours) at 04/16/15 1843 Last data filed at 04/16/15 1800  Gross per 24 hour  Intake   2550 ml  Output      0 ml  Net   2550 ml   Filed Weights   04/14/15 1717 04/15/15 0510 04/16/15 0513  Weight: 87.181 kg (192 lb 3.2 oz) 87.091 kg (192 lb) 87.499 kg (192 lb 14.4 oz)    Exam:   General:  Alert 79 year old woman in no acute distress.  Cardiovascular: S1, S2, with a soft systolic murmur.  Respiratory: Occasional crackles auscultated intermittently low lobes.  Abdomen: Positive bowel sounds, soft, nontender, nondistended.  Musculoskeletal/extremities: No pedal edema. No acute hot red joints.  Neurologic: She is alert and oriented 3. Cranial nerves II through XII are intact. No nystagmus. No pronator drift.  Data Reviewed: Basic Metabolic Panel:  Recent Labs Lab 04/14/15 1733 04/15/15 0614 04/16/15 0554  NA 137 137 137  K 4.1 3.9 4.0  CL 93* 91* 96*  CO2 31 31 29   GLUCOSE 107* 187* 266*  BUN 66* 64* 59*  CREATININE 3.08* 2.89* 2.45*  CALCIUM 10.4* 9.6 9.3   Liver Function Tests:  Recent Labs Lab 04/14/15 1733  AST 20  ALT 18  ALKPHOS 73  BILITOT 0.5  PROT 8.2*  ALBUMIN 4.3   No results for input(s): LIPASE, AMYLASE in the last 168 hours. No results for input(s): AMMONIA in the last 168  hours. CBC:  Recent Labs Lab 04/14/15 1733 04/15/15 0614 04/16/15 0554  WBC 9.4 8.6 7.1  HGB 11.9* 11.0* 10.8*  HCT 38.5 36.2 36.0  MCV 76.2* 75.7* 76.6*  PLT 332 316 281   Cardiac Enzymes:  Recent Labs Lab 04/14/15 1733 04/15/15 0010 04/15/15 0614  TROPONINI <0.03 <0.03 <0.03   BNP (last 3 results) No results for input(s): BNP in the last 8760 hours.  ProBNP (last 3 results) No results for input(s): PROBNP in the last 8760 hours.  CBG:  Recent Labs Lab 04/15/15 1656 04/15/15 2100 04/16/15 0737 04/16/15 1144 04/16/15 1639  GLUCAP 259* 291* 204* 273* 260*    No results found for this or any previous visit (from the past 240 hour(s)).   Studies: Dg Chest 2 View  04/16/2015   CLINICAL DATA:  Dizzness, coughing, sob, per patient. History of cancer, diabetes, htn  EXAM: CHEST  2 VIEW  COMPARISON:  03/28/2015  FINDINGS: Cardiac silhouette normal in size and configuration. No mediastinal or hilar masses or evidence of adenopathy.  Clear lungs.  No pleural effusion or pneumothorax.  Bony thorax is intact.  IMPRESSION: No active cardiopulmonary disease.   Electronically Signed   By: Lajean Manes M.D.   On: 04/16/2015 11:43    Scheduled Meds: . amLODipine  5 mg Oral Daily  . aspirin EC  81 mg Oral q morning - 10a  . calcitRIOL  0.25 mcg Oral  Daily  . clobetasol ointment  1 application Topical BID  . clopidogrel  75 mg Oral Daily  . clotrimazole   Topical BID  . ezetimibe  10 mg Oral q morning - 10a  . famotidine  20 mg Oral Daily  . ferrous sulfate  325 mg Oral Daily  . heparin  5,000 Units Subcutaneous 3 times per day  . insulin aspart  0-15 Units Subcutaneous TID WC  . insulin aspart  0-5 Units Subcutaneous QHS  . insulin glargine  10 Units Subcutaneous BID  . isosorbide mononitrate  60 mg Oral Daily  . lisinopril  10 mg Oral Daily  . loratadine  10 mg Oral QHS  . mometasone-formoterol  2 puff Inhalation BID  . montelukast  10 mg Oral QHS  . nebivolol  5 mg  Oral BID AC  . nystatin-triamcinolone   Topical BID  . rosuvastatin  20 mg Oral QPM  . sodium chloride  3 mL Intravenous Q12H  . tiotropium  18 mcg Inhalation Daily   Continuous Infusions: . sodium chloride 75 mL/hr at 04/16/15 1839   Assessment and plan:  Principal Problem:   Near syncope Active Problems:   Acute kidney injury   Coronary atherosclerosis   Chronic diastolic heart failure   Type II diabetes mellitus with nephropathy   Deficiency anemia   Obstructive sleep apnea   CKD (chronic kidney disease) stage 3, GFR 30-59 ml/min   Essential hypertension   Chronic respiratory failure with hypoxia   COPD with chronic bronchitis   1. Near syncope. The patient was evaluated by cardiology prior to hospital admission and was found to have symptomatic lightheadedness. Cardiology nurse practitioner, Ms. Lawrence noted that the patient may have had overuse of her diuretic therapy including spironolactone, Lasix, and Demadex. Her blood pressure was apparently on the low end of normal in the office. On admission, the patient was noted to have a low-normal blood pressure and with the BUN of 66 and a creatinine of 3.08, both elevated above baseline. Orthostatic vital signs are pending, but the etiology is likely from prerenal azotemia/dehydration and orthostatic hypotension. Patient has no neurological findings on exam. -Apparently IV access following admission, so the patient did not get much IV fluids. IV fluids have since been started. -Her orthostatic vital signs were within normal limits today. -Her TSH was within normal limits at 2.6. Her vitamin B12 level is pending for evaluation.  Acute kidney injury superimposed on stage III chronic kidney disease. The patient's baseline creatinine is 2.0-2.3. On admission, it was 3.08. This was likely associated with combination diuretic therapy leading to prerenal azotemia. Diuretic therapy is currently on hold. IV fluids will be continued  for another 24 hours..  Essential hypertension and Ischemic heart disease/CAD with history of coronary interventions with stents. Patient is without chest pain. Her troponin I was negative 3. Continue amlodipine, Plavix, Imdur, lisinopril, Bystolic, and Crestor. Cardiology consulted and appreciate their input.  Diastolic heart failure. Currently stable and compensated. She did have some crackles on pulmonary exam. Chest x-ray was ordered and revealed an unremarkable exam with clear lungs. The crackles may be chronic from her COPD.  Type 2 diabetes mellitus with nephropathy. The patient is treated with Liraglutide, 45 units of NovoLog twice a day and 40 units of Lantus twice a day. In the setting of worsening renal function, the doses of NovoLog and Lantus were decreased. Her blood glucose is now increased, so will increase the Lantus.  Obstructive sleep apnea/oxygen dependent COPD with  bronchitis and emphysema. We'll continue oxygen, nightly CPAP, Singulair, Spiriva, and Dulera Her follow-up chest x-ray was essentially clear.        Time spent: 25 minutes    [facility] Pager [phone]. If 7PM-7AM, please contact night-coverage at [email or web address], password [facility] 04/16/2015, 6:43 PM  LOS: 2 days

## 2015-04-17 ENCOUNTER — Inpatient Hospital Stay (HOSPITAL_COMMUNITY): Payer: Medicare Other

## 2015-04-17 LAB — CBC
HCT: 34 % — ABNORMAL LOW (ref 36.0–46.0)
HEMOGLOBIN: 10.3 g/dL — AB (ref 12.0–15.0)
MCH: 23.5 pg — ABNORMAL LOW (ref 26.0–34.0)
MCHC: 30.3 g/dL (ref 30.0–36.0)
MCV: 77.4 fL — ABNORMAL LOW (ref 78.0–100.0)
PLATELETS: 268 10*3/uL (ref 150–400)
RBC: 4.39 MIL/uL (ref 3.87–5.11)
RDW: 15.5 % (ref 11.5–15.5)
WBC: 7.3 10*3/uL (ref 4.0–10.5)

## 2015-04-17 LAB — GLUCOSE, CAPILLARY
GLUCOSE-CAPILLARY: 188 mg/dL — AB (ref 65–99)
GLUCOSE-CAPILLARY: 375 mg/dL — AB (ref 65–99)
Glucose-Capillary: 216 mg/dL — ABNORMAL HIGH (ref 65–99)
Glucose-Capillary: 264 mg/dL — ABNORMAL HIGH (ref 65–99)

## 2015-04-17 LAB — BASIC METABOLIC PANEL
ANION GAP: 9 (ref 5–15)
BUN: 47 mg/dL — ABNORMAL HIGH (ref 6–20)
CO2: 29 mmol/L (ref 22–32)
CREATININE: 2.03 mg/dL — AB (ref 0.44–1.00)
Calcium: 9.1 mg/dL (ref 8.9–10.3)
Chloride: 104 mmol/L (ref 101–111)
GFR calc Af Amer: 26 mL/min — ABNORMAL LOW (ref >60)
GFR calc non Af Amer: 23 mL/min — ABNORMAL LOW (ref >60)
Glucose, Bld: 206 mg/dL — ABNORMAL HIGH (ref 65–99)
Potassium: 4.4 mmol/L (ref 3.5–5.1)
Sodium: 142 mmol/L (ref 135–145)

## 2015-04-17 MED ORDER — ISOSORBIDE MONONITRATE ER 60 MG PO TB24
30.0000 mg | ORAL_TABLET | Freq: Every day | ORAL | Status: DC
  Administered 2015-04-18: 30 mg via ORAL
  Filled 2015-04-17: qty 1

## 2015-04-17 MED ORDER — INSULIN GLARGINE 100 UNIT/ML ~~LOC~~ SOLN
35.0000 [IU] | Freq: Two times a day (BID) | SUBCUTANEOUS | Status: DC
  Administered 2015-04-17 – 2015-04-18 (×3): 35 [IU] via SUBCUTANEOUS
  Filled 2015-04-17 (×5): qty 0.35

## 2015-04-17 MED ORDER — TORSEMIDE 20 MG PO TABS
10.0000 mg | ORAL_TABLET | Freq: Every day | ORAL | Status: DC
  Administered 2015-04-17 – 2015-04-18 (×2): 10 mg via ORAL
  Filled 2015-04-17 (×2): qty 1

## 2015-04-17 MED ORDER — CLOTRIMAZOLE 1 % EX CREA
TOPICAL_CREAM | CUTANEOUS | Status: AC
  Filled 2015-04-17: qty 15

## 2015-04-17 NOTE — Progress Notes (Signed)
Pt was being ambulated   Before getting to the hallway she became dizzy  She was placed in a chair VS are recorded

## 2015-04-17 NOTE — Progress Notes (Signed)
[facility] PROGRESS NOTE  Krystal [name] ZOX:[id number] DOB: 03/22/1936 DOA: 04/14/2015 PCP: [name], MD    Code Status: Full code Family Communication: Discussed with patient; family not available Disposition Plan: Discharge when clinically appropriate   Consultants:  Cardiology  Procedures:  None  Antibiotics:  None  HPI/Subjective: The patient's says she feels a little stronger, but when she got up to walk, she became dizzy. She had it back down. She denies a spinning dizziness, but rather lightheadedness. She denies headache.  Objective: Filed Vitals:   04/17/15 1623  BP: 144/51  Pulse: 80  Temp: 98.6 F (37 C)  Resp: 20   oxygen saturation 100% on nasal cannula oxygen.  Intake/Output Summary (Last 24 hours) at 04/17/15 1711 Last data filed at 04/16/15 1800  Gross per 24 hour  Intake   1950 ml  Output      0 ml  Net   1950 ml   Filed Weights   04/15/15 0510 04/16/15 0513 04/17/15 8119  Weight: 87.091 kg (192 lb) 87.499 kg (192 lb 14.4 oz) 88.225 kg (194 lb 8 oz)    Exam:   General:  Alert 79 year old woman in no acute distress.  Tympanic membranes bilaterally obscured with cerumen; no purulent drainage or erythema noted.  Cardiovascular: S1, S2, with a soft systolic murmur.  Respiratory: Rare basilar crackles; breathing nonlabored.  Abdomen: Positive bowel sounds, soft, nontender, nondistended.  Musculoskeletal/extremities: No pedal edema. No acute hot red joints.  Neurologic: She is alert and oriented 3. Cranial nerves II through XII are intact. No nystagmus. No pronator drift.  Data Reviewed: Basic Metabolic Panel:  Recent Labs Lab 04/14/15 1733 04/15/15 0614 04/16/15 0554 04/17/15 0552  NA 137 137 137 142  K 4.1 3.9 4.0 4.4  CL 93* 91* 96* 104  CO2 31 31 29 29   GLUCOSE 107* 187* 266* 206*  BUN 66* 64* 59* 47*  CREATININE 3.08* 2.89* 2.45* 2.03*  CALCIUM 10.4* 9.6 9.3 9.1   Liver Function Tests:  Recent  Labs Lab 04/14/15 1733  AST 20  ALT 18  ALKPHOS 73  BILITOT 0.5  PROT 8.2*  ALBUMIN 4.3   No results for input(s): LIPASE, AMYLASE in the last 168 hours. No results for input(s): AMMONIA in the last 168 hours. CBC:  Recent Labs Lab 04/14/15 1733 04/15/15 0614 04/16/15 0554 04/17/15 0552  WBC 9.4 8.6 7.1 7.3  HGB 11.9* 11.0* 10.8* 10.3*  HCT 38.5 36.2 36.0 34.0*  MCV 76.2* 75.7* 76.6* 77.4*  PLT 332 316 281 268   Cardiac Enzymes:  Recent Labs Lab 04/14/15 1733 04/15/15 0010 04/15/15 0614  TROPONINI <0.03 <0.03 <0.03   BNP (last 3 results) No results for input(s): BNP in the last 8760 hours.  ProBNP (last 3 results) No results for input(s): PROBNP in the last 8760 hours.  CBG:  Recent Labs Lab 04/16/15 1639 04/16/15 2050 04/17/15 0738 04/17/15 1135 04/17/15 1651  GLUCAP 260* 247* 188* 375* 216*    No results found for this or any previous visit (from the past 240 hour(s)).   Studies: Dg Chest 2 View  04/16/2015   CLINICAL DATA:  Dizzness, coughing, sob, per patient. History of cancer, diabetes, htn  EXAM: CHEST  2 VIEW  COMPARISON:  03/28/2015  FINDINGS: Cardiac silhouette normal in size and configuration. No mediastinal or hilar masses or evidence of adenopathy.  Clear lungs.  No pleural effusion or pneumothorax.  Bony thorax is intact.  IMPRESSION: No active cardiopulmonary disease.   Electronically Signed  By: Lajean Manes M.D.   On: 04/16/2015 11:43   Ct Head Wo Contrast  04/17/2015   CLINICAL DATA:  Dizziness.  EXAM: CT HEAD WITHOUT CONTRAST  TECHNIQUE: Contiguous axial images were obtained from the base of the skull through the vertex without intravenous contrast.  COMPARISON:  10/22/2014  FINDINGS: Mild patchy low attenuation throughout the subcortical and periventricular white matter is noted consistent with chronic microvascular disease. The sulci and ventricles appear normal. No evidence for acute brain infarct. No abnormal extra-axial fluid  collections, intracranial hemorrhage or mass. The paranasal sinuses are clear. Normal appearance of the mastoid air cells. The calvarium is intact.  IMPRESSION: 1. No acute intracranial abnormalities noted. 2. Small vessel ischemic disease.   Electronically Signed   By: Kerby Moors M.D.   On: 04/17/2015 15:18    Scheduled Meds: . amLODipine  5 mg Oral Daily  . aspirin EC  81 mg Oral q morning - 10a  . calcitRIOL  0.25 mcg Oral Daily  . clobetasol ointment  1 application Topical BID  . clopidogrel  75 mg Oral Daily  . clotrimazole   Topical BID  . ezetimibe  10 mg Oral q morning - 10a  . famotidine  20 mg Oral Daily  . ferrous sulfate  325 mg Oral Daily  . heparin  5,000 Units Subcutaneous 3 times per day  . insulin aspart  0-15 Units Subcutaneous TID WC  . insulin aspart  0-5 Units Subcutaneous QHS  . insulin glargine  30 Units Subcutaneous BID  . isosorbide mononitrate  60 mg Oral Daily  . lisinopril  10 mg Oral Daily  . loratadine  10 mg Oral QHS  . mometasone-formoterol  2 puff Inhalation BID  . montelukast  10 mg Oral QHS  . nebivolol  5 mg Oral BID AC  . nystatin-triamcinolone   Topical BID  . rosuvastatin  20 mg Oral QPM  . sodium chloride  3 mL Intravenous Q12H  . tiotropium  18 mcg Inhalation Daily  . torsemide  10 mg Oral Daily   Continuous Infusions:   Assessment and plan:  Principal Problem:   Near syncope Active Problems:   Acute kidney injury   Coronary atherosclerosis   Chronic diastolic heart failure   Type II diabetes mellitus with nephropathy   Deficiency anemia   Obstructive sleep apnea   CKD (chronic kidney disease) stage 3, GFR 30-59 ml/min   Essential hypertension   Chronic respiratory failure with hypoxia   COPD with chronic bronchitis   1. Near syncope with dizziness. The patient was evaluated by cardiology prior to hospital admission and was found to have symptomatic lightheadedness. Cardiology nurse practitioner, Ms. Lawrence noted that  the patient may have had overuse of her diuretic therapy including spironolactone, Lasix, and Demadex. Her blood pressure was apparently on the low end of normal in the office. On admission, the patient was noted to have a low-normal blood pressure and with the BUN of 66 and a creatinine of 3.08, both elevated above baseline. Orthostatic vital signs were will, but ordered after she received at least a liter of IV fluids. Patient has no neurological findings on exam, though she still has symptomatic dizziness, not described as vertigo. -Her TSH was within normal limits at 2.6. Her vitamin B12 level is pending for evaluation. -CT of her head, noncontrast, revealed no acute abnormalities. -Examination of her ears revealed some cerumen bilaterally, but no evidence of drainage or infection. -Her dizziness could be medication related. We  will try to decrease the dose of isosorbide  Acute kidney injury superimposed on stage III chronic kidney disease. The patient's baseline creatinine is 2.0-2.3. On admission, it was 3.08. This was likely associated with combination diuretic therapy leading to prerenal azotemia. Diuretic therapy was hold. IV fluids were started and given for 48 hours. Her renal function is back to baseline. Will restart torsemide at half the dose and saline lock her IV fluids. We'll continue to monitor.  Essential hypertension and Ischemic heart disease/CAD with history of coronary interventions with stents. Patient is without chest pain. Her troponin I was negative 3. Continue amlodipine, Plavix, Imdur, lisinopril, Bystolic, and Crestor. Cardiology consulted and appreciate their input.  Diastolic heart failure. Currently stable and compensated. She did have some crackles on pulmonary exam. Chest x-ray was ordered and revealed an unremarkable exam with clear lungs. The crackles may be chronic from her COPD.  Type 2 diabetes mellitus with nephropathy. The patient is treated with  Liraglutide, 45 units of NovoLog twice a day and 40 units of Lantus twice a day. In the setting of worsening renal function, the doses of NovoLog and Lantus were decreased. Her blood glucose is now increased, so will increase the Lantus.  Obstructive sleep apnea/oxygen dependent COPD with bronchitis and emphysema. We'll continue oxygen, nightly CPAP, Singulair, Spiriva, and Dulera Her follow-up chest x-ray was essentially clear.        Time spent: 25 minutes    [facility] Pager [phone]. If 7PM-7AM, please contact night-coverage at [email or web address], password [facility] 04/17/2015, 5:11 PM  LOS: 3 days

## 2015-04-18 DIAGNOSIS — J449 Chronic obstructive pulmonary disease, unspecified: Secondary | ICD-10-CM

## 2015-04-18 LAB — BASIC METABOLIC PANEL
Anion gap: 12 (ref 5–15)
BUN: 46 mg/dL — ABNORMAL HIGH (ref 6–20)
CALCIUM: 9.8 mg/dL (ref 8.9–10.3)
CO2: 30 mmol/L (ref 22–32)
Chloride: 98 mmol/L — ABNORMAL LOW (ref 101–111)
Creatinine, Ser: 2 mg/dL — ABNORMAL HIGH (ref 0.44–1.00)
GFR, EST AFRICAN AMERICAN: 27 mL/min — AB (ref >60)
GFR, EST NON AFRICAN AMERICAN: 23 mL/min — AB (ref >60)
Glucose, Bld: 215 mg/dL — ABNORMAL HIGH (ref 65–99)
Potassium: 4.4 mmol/L (ref 3.5–5.1)
Sodium: 140 mmol/L (ref 135–145)

## 2015-04-18 LAB — GLUCOSE, CAPILLARY
GLUCOSE-CAPILLARY: 205 mg/dL — AB (ref 65–99)
Glucose-Capillary: 294 mg/dL — ABNORMAL HIGH (ref 65–99)

## 2015-04-18 MED ORDER — POLYETHYLENE GLYCOL 3350 17 G PO PACK
17.0000 g | PACK | Freq: Every day | ORAL | Status: DC
  Administered 2015-04-18: 17 g via ORAL
  Filled 2015-04-18: qty 1

## 2015-04-18 MED ORDER — TORSEMIDE 20 MG PO TABS: 20.0000 mg | ORAL_TABLET | Freq: Every day | ORAL | Status: DC

## 2015-04-18 NOTE — Discharge Summary (Signed)
Physician Discharge Summary  Krystal [name] BZJ:[id number] DOB: 06-29-1936 DOA: 04/14/2015  PCP: Krystal Font, MD  Admit date: 04/14/2015 Discharge date: 04/18/2015  Time spent: Greater than 30 minutes  Recommendations for Outpatient Follow-up:  1. Vitamin B12 level pending at the time of discharge. 2. Recommend follow-up of the patient's renal function.  3. Home health aide and registered nurse ordered at discharge. 4. Recommend further evaluation of the patient's microcytic anemia in the outpatient setting.  Discharge Diagnoses:  1. Dizziness with near syncope, likely secondary to hypovolemia. 2. Acute kidney injury secondary to prerenal azotemia, superimposed on stage III chronic kidney disease. -The patient's creatinine was 3.08 on admission and 2.0 at the time of discharge. 3. Coronary artery disease. Troponin I was negative 3. 4. Chronic diastolic heart failure. Remained compensated. 5. Oxygen dependent COPD/chronic hypoxic respiratory failure. Remains stable. 6.Type 2 diabetes mellitus with nephropathy. Remained stable. 7. Obstructive sleep apnea. 8. Essential hypertension. 9. Microcytic anemia. Patient's hemoglobin was 10.3 at the time of discharge.   Discharge Condition: Improved.  Diet recommendation: Heart healthy, carbohydrate modified.  Filed Weights   04/16/15 0513 04/17/15 0633 04/18/15 0500  Weight: 87.499 kg (192 lb 14.4 oz) 88.225 kg (194 lb 8 oz) 87.907 kg (193 lb 12.8 oz)    History of present illness:  The patient is a 79 or old woman with a history of coronary artery disease, chronic diastolic congestive heart failure, oxygen dependent COPD, and chronic kidney disease, who was admitted directly from the cardiologist office for a complaint of dizziness and lightheadedness. Cardiology ordered lab work and it revealed that her creatinine was significantly elevated above baseline at 3.08 and her BUN was 66. She was admitted for further evaluation and  management.  Hospital Course:   1. Near syncope with dizziness. The patient was evaluated by cardiology prior to hospital admission and was found to have symptomatic lightheadedness. Cardiology nurse practitioner, Ms. Lawrence noted that the patient may have had overuse of her diuretic therapy including spironolactone, Lasix, and Demadex. Her blood pressure was apparently on the low end of normal in the office. On admission, the patient was noted to have a low-normal blood pressure and with the BUN of 66 and a creatinine of 3.08, both elevated above baseline. The patient did not endorse vertigo. Orthostatic vital signs were within normal limits, but they were taken after she had received at least a liter of IV fluids. For further evaluation, a number of studies were ordered. -Her TSH was within normal limits at 2.6. -CT of her head, noncontrast, revealed no acute abnormalities. -Examination of her ears revealed some cerumen bilaterally, but no evidence of drainage or infection. -Vitamin B12 level was pending at the time of discharge. Following IV fluid hydration, her lightheadedness subsided. Acute kidney injury superimposed on stage III chronic kidney disease. The patient's baseline creatinine is 2.0-2.3. On admission, it was 3.08. This was likely secondary to prerenal azotemia from combination diuretic therapy. Diuretics were held and she was started on IV fluids. Her renal function improved back to baseline and was 2.00 at the time of discharge. She was restarted on torsemide at 20 mg daily. Demadex had been discontinued prior to hospital admission. She was instructed to continue to hold Aldactone until she follows up with cardiology. She voiced understanding. Essential hypertension and Ischemic heart disease/CAD with history of coronary interventions with stents. Patient was without chest pain. Her troponin I was negative 3. She was continued on amlodipine, Plavix, Imdur, lisinopril,  Bystolic, and Crestor.  Cardiology was consulted and agreed with medical management. Chronic diastolic heart failure. Remained stable and compensated. She did have a few crackles on pulmonary exam. Chest x-ray was ordered and revealed an unremarkable exam with clear lungs. The crackles may be chronic from her COPD. Type 2 diabetes mellitus with nephropathy. The patient is treated with Liraglutide, 45 units of NovoLog twice a day and 40 units of Lantus twice a day at home. In the setting of worsening renal function, the doses of NovoLog and Lantus were decreased. She was instructed to resume her home doses at the time of discharge as her renal function returned to baseline. Obstructive sleep apnea/oxygen dependent COPD with bronchitis and emphysema. She was continued on oxygen, nightly CPAP, Singulair, Spiriva, and Dulera Her follow-up chest x-ray was essentially clear.   Procedures:  None  Consultations:  Cardiology  Discharge Exam: Filed Vitals:   04/18/15 1141  BP: 123/47  Pulse:   Temp:   Resp:    pulse 65 respiratory rate 16 blood pressure 123/47 oxygen saturation 1%    General: Alert 79 year old woman in no acute distress..  Cardiovascular: S1, S2, with a soft systolic murmur.  Respiratory: Clear to auscultation bilaterally.  Abdomen: Positive bowel sounds, soft, nontender, nondistended.  Musculoskeletal/extremities: No pedal edema. No acute hot red joints.  Neurologic: She is alert and oriented 3. Cranial nerves II through XII are intact. She is seen ambulating in the room without difficulty.   Discharge Instructions   Discharge Instructions    Diet - low sodium heart healthy    Complete by:  As directed      Diet Carb Modified    Complete by:  As directed      Discharge instructions    Complete by:  As directed   The doses of some of your medications have changed or have been discontinued. See medication list. Stand slowly. Call for follow-up to see your  cardiologist in your primary care physician in 1-2 weeks.     Increase activity slowly    Complete by:  As directed           Current Discharge Medication List    CONTINUE these medications which have CHANGED   Details  torsemide (DEMADEX) 20 MG tablet Take 1 tablet (20 mg total) by mouth daily.      CONTINUE these medications which have NOT CHANGED   Details  AMLODIPINE BESYLATE PO Take 5 mg by mouth daily.    aspirin 81 MG EC tablet Take 81 mg by mouth every morning.     budesonide-formoterol (SYMBICORT) 160-4.5 MCG/ACT inhaler Inhale 2 puffs into the lungs 2 (two) times daily as needed (for shortness of breath).    calcitRIOL (ROCALTROL) 0.25 MCG capsule Take 0.25 mcg by mouth daily. Frequency change. Now taking everyday.    clopidogrel (PLAVIX) 75 MG tablet TAKE 1 TABLET ONCE DAILY. Qty: 30 tablet, Refills: 6    ezetimibe (ZETIA) 10 MG tablet Take 10 mg by mouth every morning.     famotidine (PEPCID) 20 MG tablet TAKE (1) TABLET TWICE DAILY. Qty: 60 tablet, Refills: 3    ferrous sulfate 325 (65 FE) MG tablet Take 325 mg by mouth daily.     Fluticasone-Salmeterol (ADVAIR DISKUS) 250-50 MCG/DOSE AEPB Inhale 1 puff into the lungs 2 (two) times daily.     HYDROcodone-acetaminophen (VICODIN) 5-500 MG per tablet Take 1 tablet by mouth 2 (two) times daily as needed for pain.     insulin glargine (LANTUS) 100 UNIT/ML injection  Inject 40 Units into the skin 2 (two) times daily.     isosorbide mononitrate (IMDUR) 60 MG 24 hr tablet TAKE 1&1/2 TABLETS BY MOUTH EVERY MORNING AND 1/2 TABLET IN THE EVENING. Qty: 60 tablet, Refills: 6    levocetirizine (XYZAL) 5 MG tablet Take 5 mg by mouth every evening.    Liraglutide (VICTOZA) 18 MG/3ML SOPN Inject 1.8 mLs into the skin daily. As directed    lisinopril (PRINIVIL,ZESTRIL) 10 MG tablet Take 1 tablet (10 mg total) by mouth daily. Qty: 90 tablet, Refills: 3    montelukast (SINGULAIR) 10 MG tablet Take 10 mg by mouth at bedtime.     nebivolol (BYSTOLIC) 5 MG tablet Take 1 tab 2 times daily Qty: 60 tablet, Refills: 3    NITROSTAT 0.4 MG SL tablet PLACE ONE (1) TABLET UNDER TONGUE EVERY 5 MINUTES UP TO (3) DOSES AS NEEDED FOR CHEST PAIN. Qty: 25 tablet, Refills: 0    NOVOLOG FLEXPEN 100 UNIT/ML FlexPen USE 25 UNITS AT BREAKFAST, 20 UNITS AT LUNCH AND 35 UNITS AT SUPPER SUB-Q. Qty: 30 mL, Refills: 2    nystatin-triamcinolone ointment (MYCOLOG) Apply 1 application topically 2 (two) times daily. To affected area. Qty: 30 g, Refills: prn    OXYGEN-HELIUM IN Inhale into the lungs. 2 liters continuous    POLYETHYLENE GLYCOL 3350-GRX PO Take by mouth as needed.    rosuvastatin (CRESTOR) 20 MG tablet Take 20 mg by mouth every evening.    tiotropium (SPIRIVA) 18 MCG inhalation capsule Place 18 mcg into inhaler and inhale daily.    clobetasol ointment (TEMOVATE) 3.81 % Apply 1 application topically 2 (two) times daily.      STOP taking these medications     Cholecalciferol (VITAMIN D3) 2000 UNITS TABS      fluconazole (DIFLUCAN) 150 MG tablet      Lidocaine-Hydrocortisone Ace 3-2.5 % KIT      spironolactone (ALDACTONE) 25 MG tablet        Allergies  Allergen Reactions  . Morphine Shortness Of Breath and Swelling  . Penicillins Shortness Of Breath and Swelling   Follow-up Information    Follow up with [facility].   Contact information:   [address] [place] [address] [phone]       Follow up with Jory Sims, NP. Schedule an appointment as soon as possible for a visit in 1 week.   Specialties:  Nurse Practitioner, Radiology, Cardiology   Why:  For follow-up of your heart medications.   Contact information:   [place] [address] [phone]       Follow up with [facility], MD In 2 weeks.   Specialty:  Family Medicine   Why:  Follow-up in 1-2 weeks for discussion about assisted living and to recheck your chronic medical conditions.Minette Brine information:   [address] [place] [address] [phone]        The results of significant diagnostics from this hospitalization (including imaging, microbiology, ancillary and laboratory) are listed below for reference.    Significant Diagnostic Studies: Dg Chest 2 View  04/16/2015   CLINICAL DATA:  Dizzness, coughing, sob, per patient. History of cancer, diabetes, htn  EXAM: CHEST  2 VIEW  COMPARISON:  03/28/2015  FINDINGS: Cardiac silhouette normal in size and configuration. No mediastinal or hilar masses or evidence of adenopathy.  Clear lungs.  No pleural effusion or pneumothorax.  Bony thorax is intact.  IMPRESSION: No active  cardiopulmonary disease.   Electronically Signed   By: Lajean Manes M.D.   On: 04/16/2015 11:43   Dg Chest 2 View  03/28/2015   CLINICAL DATA:  Chest pain, shortness of breath, LEFT arm pain, symptoms for a few days, hypertension, diabetes, coronary artery disease post MI, chronic kidney disease, former smoker  EXAM: CHEST  2 VIEW  COMPARISON:  03/09/2014  FINDINGS: Upper normal heart size.  Atherosclerotic calcification aorta.  Mediastinal contours and pulmonary vascularity normal.  Lungs clear.  No pleural effusion or pneumothorax.  Bones unremarkable.  IMPRESSION: No acute abnormalities.   Electronically Signed   By: Lavonia Dana M.D.   On: 03/28/2015 13:15   Ct Head Wo Contrast  04/17/2015   CLINICAL DATA:  Dizziness.  EXAM: CT HEAD WITHOUT CONTRAST  TECHNIQUE: Contiguous axial images were obtained from the base of the skull through the vertex without intravenous contrast.  COMPARISON:  10/22/2014  FINDINGS: Mild patchy low attenuation throughout the subcortical and periventricular white matter is noted consistent with chronic microvascular disease. The sulci and ventricles appear normal. No evidence for acute brain infarct. No abnormal extra-axial fluid collections, intracranial hemorrhage or mass. The paranasal sinuses are clear. Normal  appearance of the mastoid air cells. The calvarium is intact.  IMPRESSION: 1. No acute intracranial abnormalities noted. 2. Small vessel ischemic disease.   Electronically Signed   By: Kerby Moors M.D.   On: 04/17/2015 15:18    Microbiology: No results found for this or any previous visit (from the past 240 hour(s)).   Labs: Basic Metabolic Panel:  Recent Labs Lab 04/14/15 1733 04/15/15 0614 04/16/15 0554 04/17/15 0552 04/18/15 0706  NA 137 137 137 142 140  K 4.1 3.9 4.0 4.4 4.4  CL 93* 91* 96* 104 98*  CO2 '31 31 29 29 30  ' GLUCOSE 107* 187* 266* 206* 215*  BUN 66* 64* 59* 47* 46*  CREATININE 3.08* 2.89* 2.45* 2.03* 2.00*  CALCIUM 10.4* 9.6 9.3 9.1 9.8   Liver Function Tests:  Recent Labs Lab 04/14/15 1733  AST 20  ALT 18  ALKPHOS 73  BILITOT 0.5  PROT 8.2*  ALBUMIN 4.3   No results for input(s): LIPASE, AMYLASE in the last 168 hours. No results for input(s): AMMONIA in the last 168 hours. CBC:  Recent Labs Lab 04/14/15 1733 04/15/15 0614 04/16/15 0554 04/17/15 0552  WBC 9.4 8.6 7.1 7.3  HGB 11.9* 11.0* 10.8* 10.3*  HCT 38.5 36.2 36.0 34.0*  MCV 76.2* 75.7* 76.6* 77.4*  PLT 332 316 281 268   Cardiac Enzymes:  Recent Labs Lab 04/14/15 1733 04/15/15 0010 04/15/15 0614  TROPONINI <0.03 <0.03 <0.03   BNP: BNP (last 3 results) No results for input(s): BNP in the last 8760 hours.  ProBNP (last 3 results) No results for input(s): PROBNP in the last 8760 hours.  CBG:  Recent Labs Lab 04/17/15 1135 04/17/15 1651 04/17/15 2040 04/18/15 0718 04/18/15 1120  GLUCAP 375* 216* 264* 205* 294*       Signed:  Jaystin Mcgarvey  [facility] 04/18/2015, 12:06 PM

## 2015-04-18 NOTE — Progress Notes (Signed)
Discharge instructions reviewed with pt.  Unable to schedule follow-up appt with Dr. Berdine Addison due to holiday schedule.  Instructed pt to make the follow-up appt tomorrow for 1/2 weeks with Dr. Berdine Addison.  Pt verbalized understanding.  Reviewed with pt that [facility] will do a face-to-face visit with her tomorrow.  IV removed without complications.  Catheter intact.  Pt discharged via wheelchair via hospitality services.

## 2015-04-18 NOTE — Clinical Social Work Note (Signed)
Clinical Social Work Assessment  Patient Details  Name: CADEE AGRO MRN: [id number] Date of Birth: 10-Feb-1936  Date of referral:  04/18/15               Reason for consult:  Facility Placement                Permission sought to share information with:    Permission granted to share information::     Name::        Agency::     Relationship::     Contact Information:     Housing/Transportation Living arrangements for the past 2 months:  Single Family Home Source of Information:  Patient Patient Interpreter Needed:  None Criminal Activity/Legal Involvement Pertinent to Current Situation/Hospitalization:  No - Comment as needed Significant Relationships:  None Lives with:  Self Do you feel safe going back to the place where you live?  Yes (wants to consider other long term options) Need for family participation in patient care:   (Pt describes no support.)  Care giving concerns:  Pt lives alone and reports no support system.    Social Worker assessment / plan:  CSW met with pt at bedside. Pt alert and oriented and reports she lives alone. She has a 14 year old aunt, but does not have anyone who can assist her at home. Pt is independent with ADLs and still drives. She has difficulty with cooking and cleaning. Pt has been paying someone to clean, but is not sure how much longer she can continue to do so. She typically fixes sandwiches, boiled eggs, etc for meals. Pt was receiving Meals on Wheels but stopped it because she didn't find it convenient for her. CSW discussed ALF with pt as she mentioned to MD she was interested in this. Pt states she is only willing to pay $600 a month. CSW discussed with her that this was not a realistic amount for placement. She is aware she may need to apply for Medicaid, but understands that most of her check will go towards placement if she qualifies. Pt said she has other bills right now that she needs to take care of, but she will consider ALF from home  in the future. Per CM, pt will have home health at d/c. CSW will sign off, but can be reconsulted if needed.   Employment status:  Retired Forensic scientist:  Medicare PT Recommendations:  Not assessed at this time [facility] / Referral to community resources:  Other (Comment Required) (ALF list)  Patient/Family's Response to care:  Pt states, "I'm not going to a facility today." She wants to plan for the future and was appreciative of information to contact facilities on her own when ready.   Patient/Family's Understanding of and Emotional Response to Diagnosis, Current Treatment, and Prognosis:  Pt appears to have understanding of chronic medical issues. She recognizes that she may not be able to manage at home much longer and plans to find a facility on her own.   Emotional Assessment Appearance:  Appears younger than stated age Attitude/Demeanor/Rapport:  Other (cooperative) Affect (typically observed):  Accepting Orientation:  Oriented to Self, Oriented to Place, Oriented to  Time, Oriented to Situation Alcohol / Substance use:  Not Applicable Psych involvement (Current and /or in the community):  No (Comment)  Discharge Needs  Concerns to be addressed:  No discharge needs identified Readmission within the last 30 days:  No Current discharge risk:  Lack of support system Barriers to Discharge:  No Barriers Identified   Salome Arnt, Geraldine 04/18/2015, 11:36 AM [phone]

## 2015-04-18 NOTE — Care Management Note (Signed)
Case Management Note  Patient Details  Name: Krystal [name] MRN: [id number] Date of Birth: 10/14/1936  Expected Discharge Date:                  Expected Discharge Plan:  [place]  In-House Referral:  NA  Discharge planning Services  CM Consult  Post Acute Care Choice:  Home Health Choice offered to:  Patient  DME Arranged:    DME Agency:     HH Arranged:  RN, Nurse's Aide [place] Agency:  [place]  Status of Service:  Completed, signed off  Medicare Important Message Given:  Yes Date Medicare IM Given:  04/15/15 Medicare IM give by:  Christinia Gully, RN BSN CM Date Additional Medicare IM Given:  04/18/15 Additional Medicare Important Message give by:  Jolene Provost, RN, MSN, CM   If discussed at Long Length of Stay Meetings, dates discussed:    Additional Comments: Pt discharging home with [facility] RN & aid through [facility]. Romualdo Bolk, of [facility], aware of discharge. No further CM needs.   Sherald Barge, RN 04/18/2015, 2:00 PM

## 2015-04-19 DIAGNOSIS — I2510 Atherosclerotic heart disease of native coronary artery without angina pectoris: Secondary | ICD-10-CM | POA: Diagnosis not present

## 2015-04-19 DIAGNOSIS — E119 Type 2 diabetes mellitus without complications: Secondary | ICD-10-CM | POA: Diagnosis not present

## 2015-04-19 DIAGNOSIS — N183 Chronic kidney disease, stage 3 (moderate): Secondary | ICD-10-CM | POA: Diagnosis not present

## 2015-04-19 DIAGNOSIS — I129 Hypertensive chronic kidney disease with stage 1 through stage 4 chronic kidney disease, or unspecified chronic kidney disease: Secondary | ICD-10-CM | POA: Diagnosis not present

## 2015-04-19 DIAGNOSIS — I252 Old myocardial infarction: Secondary | ICD-10-CM | POA: Diagnosis not present

## 2015-04-19 DIAGNOSIS — J449 Chronic obstructive pulmonary disease, unspecified: Secondary | ICD-10-CM | POA: Diagnosis not present

## 2015-04-19 DIAGNOSIS — Z794 Long term (current) use of insulin: Secondary | ICD-10-CM | POA: Diagnosis not present

## 2015-04-19 DIAGNOSIS — E785 Hyperlipidemia, unspecified: Secondary | ICD-10-CM | POA: Diagnosis not present

## 2015-04-19 DIAGNOSIS — M150 Primary generalized (osteo)arthritis: Secondary | ICD-10-CM | POA: Diagnosis not present

## 2015-04-19 DIAGNOSIS — I5032 Chronic diastolic (congestive) heart failure: Secondary | ICD-10-CM | POA: Diagnosis not present

## 2015-04-19 DIAGNOSIS — K219 Gastro-esophageal reflux disease without esophagitis: Secondary | ICD-10-CM | POA: Diagnosis not present

## 2015-04-20 DIAGNOSIS — I129 Hypertensive chronic kidney disease with stage 1 through stage 4 chronic kidney disease, or unspecified chronic kidney disease: Secondary | ICD-10-CM | POA: Diagnosis not present

## 2015-04-20 DIAGNOSIS — I2510 Atherosclerotic heart disease of native coronary artery without angina pectoris: Secondary | ICD-10-CM | POA: Diagnosis not present

## 2015-04-20 DIAGNOSIS — E119 Type 2 diabetes mellitus without complications: Secondary | ICD-10-CM | POA: Diagnosis not present

## 2015-04-20 DIAGNOSIS — I5032 Chronic diastolic (congestive) heart failure: Secondary | ICD-10-CM | POA: Diagnosis not present

## 2015-04-20 DIAGNOSIS — N183 Chronic kidney disease, stage 3 (moderate): Secondary | ICD-10-CM | POA: Diagnosis not present

## 2015-04-20 DIAGNOSIS — E1140 Type 2 diabetes mellitus with diabetic neuropathy, unspecified: Secondary | ICD-10-CM | POA: Diagnosis not present

## 2015-04-20 DIAGNOSIS — J449 Chronic obstructive pulmonary disease, unspecified: Secondary | ICD-10-CM | POA: Diagnosis not present

## 2015-04-21 DIAGNOSIS — I129 Hypertensive chronic kidney disease with stage 1 through stage 4 chronic kidney disease, or unspecified chronic kidney disease: Secondary | ICD-10-CM | POA: Diagnosis not present

## 2015-04-21 DIAGNOSIS — I5032 Chronic diastolic (congestive) heart failure: Secondary | ICD-10-CM | POA: Diagnosis not present

## 2015-04-21 DIAGNOSIS — N183 Chronic kidney disease, stage 3 (moderate): Secondary | ICD-10-CM | POA: Diagnosis not present

## 2015-04-21 DIAGNOSIS — I2510 Atherosclerotic heart disease of native coronary artery without angina pectoris: Secondary | ICD-10-CM | POA: Diagnosis not present

## 2015-04-21 DIAGNOSIS — J449 Chronic obstructive pulmonary disease, unspecified: Secondary | ICD-10-CM | POA: Diagnosis not present

## 2015-04-21 DIAGNOSIS — E119 Type 2 diabetes mellitus without complications: Secondary | ICD-10-CM | POA: Diagnosis not present

## 2015-04-25 DIAGNOSIS — J449 Chronic obstructive pulmonary disease, unspecified: Secondary | ICD-10-CM | POA: Diagnosis not present

## 2015-04-25 DIAGNOSIS — E119 Type 2 diabetes mellitus without complications: Secondary | ICD-10-CM | POA: Diagnosis not present

## 2015-04-25 DIAGNOSIS — N183 Chronic kidney disease, stage 3 (moderate): Secondary | ICD-10-CM | POA: Diagnosis not present

## 2015-04-25 DIAGNOSIS — I2510 Atherosclerotic heart disease of native coronary artery without angina pectoris: Secondary | ICD-10-CM | POA: Diagnosis not present

## 2015-04-25 DIAGNOSIS — I5032 Chronic diastolic (congestive) heart failure: Secondary | ICD-10-CM | POA: Diagnosis not present

## 2015-04-25 DIAGNOSIS — I129 Hypertensive chronic kidney disease with stage 1 through stage 4 chronic kidney disease, or unspecified chronic kidney disease: Secondary | ICD-10-CM | POA: Diagnosis not present

## 2015-04-26 DIAGNOSIS — I2510 Atherosclerotic heart disease of native coronary artery without angina pectoris: Secondary | ICD-10-CM | POA: Diagnosis not present

## 2015-04-26 DIAGNOSIS — N183 Chronic kidney disease, stage 3 (moderate): Secondary | ICD-10-CM | POA: Diagnosis not present

## 2015-04-26 DIAGNOSIS — E119 Type 2 diabetes mellitus without complications: Secondary | ICD-10-CM | POA: Diagnosis not present

## 2015-04-26 DIAGNOSIS — I129 Hypertensive chronic kidney disease with stage 1 through stage 4 chronic kidney disease, or unspecified chronic kidney disease: Secondary | ICD-10-CM | POA: Diagnosis not present

## 2015-04-26 DIAGNOSIS — I5032 Chronic diastolic (congestive) heart failure: Secondary | ICD-10-CM | POA: Diagnosis not present

## 2015-04-26 DIAGNOSIS — J449 Chronic obstructive pulmonary disease, unspecified: Secondary | ICD-10-CM | POA: Diagnosis not present

## 2015-04-27 ENCOUNTER — Ambulatory Visit (INDEPENDENT_AMBULATORY_CARE_PROVIDER_SITE_OTHER): Payer: Medicare Other | Admitting: Physician Assistant

## 2015-04-27 ENCOUNTER — Encounter: Payer: Self-pay | Admitting: Physician Assistant

## 2015-04-27 VITALS — BP 130/70 | HR 81 | Ht 67.0 in | Wt 195.0 lb

## 2015-04-27 DIAGNOSIS — R42 Dizziness and giddiness: Secondary | ICD-10-CM

## 2015-04-27 DIAGNOSIS — I10 Essential (primary) hypertension: Secondary | ICD-10-CM

## 2015-04-27 DIAGNOSIS — I2510 Atherosclerotic heart disease of native coronary artery without angina pectoris: Secondary | ICD-10-CM | POA: Diagnosis not present

## 2015-04-27 DIAGNOSIS — I5032 Chronic diastolic (congestive) heart failure: Secondary | ICD-10-CM

## 2015-04-27 LAB — BASIC METABOLIC PANEL
BUN: 78 mg/dL — AB (ref 6–23)
CALCIUM: 10.1 mg/dL (ref 8.4–10.5)
CO2: 29 mEq/L (ref 19–32)
Chloride: 99 mEq/L (ref 96–112)
Creat: 2.48 mg/dL — ABNORMAL HIGH (ref 0.50–1.10)
Glucose, Bld: 244 mg/dL — ABNORMAL HIGH (ref 70–99)
POTASSIUM: 4.7 meq/L (ref 3.5–5.3)
Sodium: 138 mEq/L (ref 135–145)

## 2015-04-27 NOTE — Assessment & Plan Note (Signed)
2-D echo on 04/15/15 showed severe LVH with grade 1 diastolic dysfunction. She has normal systolic function. She has a little bit a drop in her blood pressure with change of position. We'll stop amlodipine and monitor closely.

## 2015-04-27 NOTE — Patient Instructions (Signed)
Your physician recommends that you schedule a follow-up appointment next week with Jory Sims, NP.   Your physician recommends that you schedule a follow-up appointment in: Dr. Bronson Ing  Your physician has recommended you make the following change in your medication:   STOP Taking Amlodipine   Your physician recommends that you return for lab work in: [place] ([facility])   Thank you for choosing [facility]! '

## 2015-04-27 NOTE — Progress Notes (Signed)
Cardiology Office Note   Date:  04/27/2015   ID:  Krystal [name], DOB 03/26/1936, MRN [id number]  PCP:  [name], MD  Cardiologist: Dr. Bronson Ing  Chief Complaint: Dizziness    History of Present Illness: Krystal [name] is a 79 y.o. female who presents for post hospital follow-up. She was admitted to the hospital with dizziness and near syncope likely secondary to hypovolemia. She was taking double diuretics as an outpatient as well as spironolactone. Patient also had acute kidney injury secondary to prerenal azotemia and superimposed stage III chronic TIMI disease. Patient's creatinine was 3.08 on admission 2.0 at discharge. 2-D echo on 04/15/15 showed severe LVH EF 54-62% grade 1 diastolic dysfunction trivial mitral and tricuspid regurgitation PASP 27 mmHg. She was discharged home on torsemide 20 mg daily. Her Aldactone was held.  Patient continues to complain of dizziness with change of position. She says it has not improvement since the hospitalization. She has multiple questions overall the medications that she has to take. Blood pressures drop slightly when she goes from lying to sitting but not when she goes from sitting to standing. She also has occasional exertional chest tightness. It's eased with rest.    Past Medical History  Diagnosis Date  . Sleep apnea     CPAP  . Myalgia   . Palpitations   . Microcytic anemia     History of occult blood in stool  . DJD (degenerative joint disease)   . CAD (coronary artery disease) 2009    5 stents- #3 in RCA, #1 each in LAD and AVG  . Osteoarthritis   . Essential hypertension   . Hyperlipidemia   . GERD (gastroesophageal reflux disease)   . Allergic rhinitis   . Personal history of colonic polyps   . HOH (hard of hearing)   . MI (myocardial infarction) 1998  . Glaucoma   . Diastolic dysfunction, left ventricle   . CKD (chronic kidney disease) stage 3, GFR 30-59 ml/min   . Emphysema lung     2L N/C continuously  .  Complete heart block, transient 2014  . Gout   . Type II diabetes mellitus with nephropathy   . History of uterine cancer     Past Surgical History  Procedure Laterality Date  . Ectopic pregnancy surgery    . Foot surgery      Left and right for callous  . Cervical discectomy      L5 left/hemilminectomy  . Colonoscopy  09/2006    Int hemmorhoids, COMPLICATED BY CARDIOPULMONARY COMPLICATIONS  . Abdominal hysterectomy  04/2010    Uterine cancer,TAHBSO  . Polypectomy  10/22/2011    Internal hemorrhoids/sessile polyp  . Coronary angioplasty  1/99, 1/07, 1/08, 4/09    5 cardiac stents total  . Left and right heart catheterization with coronary angiogram N/A 09/12/2012    Procedure: LEFT AND RIGHT HEART CATHETERIZATION WITH CORONARY ANGIOGRAM;  Surgeon: Sanda Klein, MD;  Location: [facility] CATH LAB;  Service: Cardiovascular;  Laterality: N/A;     Current Outpatient Prescriptions  Medication Sig Dispense Refill  . AMLODIPINE BESYLATE PO Take 5 mg by mouth daily.    Marland Kitchen aspirin 81 MG EC tablet Take 81 mg by mouth every morning.     . budesonide-formoterol (SYMBICORT) 160-4.5 MCG/ACT inhaler Inhale 2 puffs into the lungs 2 (two) times daily as needed (for shortness of breath).    . calcitRIOL (ROCALTROL) 0.25 MCG capsule Take 0.25 mcg by mouth daily. Frequency change. Now taking everyday.    Marland Kitchen  clobetasol ointment (TEMOVATE) 6.94 % Apply 1 application topically 2 (two) times daily.    . clopidogrel (PLAVIX) 75 MG tablet TAKE 1 TABLET ONCE DAILY. 30 tablet 6  . ezetimibe (ZETIA) 10 MG tablet Take 10 mg by mouth every morning.     . famotidine (PEPCID) 20 MG tablet TAKE (1) TABLET TWICE DAILY. 60 tablet 3  . ferrous sulfate 325 (65 FE) MG tablet Take 325 mg by mouth daily.     . Fluticasone-Salmeterol (ADVAIR DISKUS) 250-50 MCG/DOSE AEPB Inhale 1 puff into the lungs 2 (two) times daily.     Marland Kitchen HYDROcodone-acetaminophen (VICODIN) 5-500 MG per tablet Take 1 tablet by mouth 2 (two) times daily as  needed for pain.     Marland Kitchen insulin glargine (LANTUS) 100 UNIT/ML injection Inject 40 Units into the skin 2 (two) times daily.     . isosorbide mononitrate (IMDUR) 60 MG 24 hr tablet TAKE 1&1/2 TABLETS BY MOUTH EVERY MORNING AND 1/2 TABLET IN THE EVENING. 60 tablet 6  . levocetirizine (XYZAL) 5 MG tablet Take 5 mg by mouth every evening.    . Liraglutide (VICTOZA) 18 MG/3ML SOPN Inject 1.8 mLs into the skin daily. As directed    . lisinopril (PRINIVIL,ZESTRIL) 10 MG tablet Take 1 tablet (10 mg total) by mouth daily. 90 tablet 3  . montelukast (SINGULAIR) 10 MG tablet Take 10 mg by mouth at bedtime.    . nebivolol (BYSTOLIC) 5 MG tablet Take 1 tab 2 times daily 60 tablet 3  . NITROSTAT 0.4 MG SL tablet PLACE ONE (1) TABLET UNDER TONGUE EVERY 5 MINUTES UP TO (3) DOSES AS NEEDED FOR CHEST PAIN. 25 tablet 0  . NOVOLOG FLEXPEN 100 UNIT/ML FlexPen USE 25 UNITS AT BREAKFAST, 20 UNITS AT LUNCH AND 35 UNITS AT SUPPER SUB-Q. (Patient taking differently: 40 units in the morning) 30 mL 2  . nystatin-triamcinolone ointment (MYCOLOG) Apply 1 application topically 2 (two) times daily. To affected area. 30 g prn  . OXYGEN-HELIUM IN Inhale into the lungs. 2 liters continuous    . POLYETHYLENE GLYCOL 3350-GRX PO Take by mouth as needed.    . rosuvastatin (CRESTOR) 20 MG tablet Take 20 mg by mouth every evening.    . tiotropium (SPIRIVA) 18 MCG inhalation capsule Place 18 mcg into inhaler and inhale daily.    Marland Kitchen torsemide (DEMADEX) 20 MG tablet Take 1 tablet (20 mg total) by mouth daily.     No current facility-administered medications for this visit.    Allergies:   Morphine and Penicillins    Social History:  The patient  reports that she quit smoking about 8 years ago. Her smoking use included Cigarettes. She started smoking about 60 years ago. She has a 20.4 pack-year smoking history. She has never used smokeless tobacco. She reports that she does not drink alcohol or use illicit drugs.   Family History:  The  patient's    family history is negative for Anesthesia problems, Hypotension, Malignant hyperthermia, and Pseudochol deficiency.    ROS:  Please see the history of present illness.   Otherwise, review of systems are positive for none.   All other systems are reviewed and negative.    PHYSICAL EXAM: BP 130/70 mmHg  Pulse 81  Ht 5\' 7"  (1.702 m)  Wt 195 lb (88.451 kg)  BMI 30.53 kg/m2  SpO2 99% GEN: Well nourished, well developed, in no acute distress Neck: no JVD, HJR, carotid bruits, or masses Cardiac: RRR; no murmurs,gallop, rubs, thrill or heave,  Respiratory:  clear to auscultation bilaterally, normal work of breathing GI: soft, nontender, nondistended, + BS MS: no deformity or atrophy Extremities: without cyanosis, clubbing, edema, good distal pulses bilaterally.  Skin: warm and dry, no rash Neuro:  Strength and sensation are intact    EKG:  EKG is ordered today. The ekg ordered today demonstratesnormal sinus rhythm with nonspecific ST-T wave changes T wave inversion inferior lateral   Recent Labs: 04/14/2015: ALT 18 04/15/2015: TSH 2.617 04/17/2015: Hemoglobin 10.3*; Platelets 268 04/18/2015: BUN 46*; Creatinine, Ser 2.00*; Potassium 4.4; Sodium 140    Lipid Panel    Component Value Date/Time   CHOL 191 12/18/2013 1058   TRIG 186* 12/18/2013 1058   HDL 39* 12/18/2013 1058   CHOLHDL 4.9 12/18/2013 1058   VLDL 37 12/18/2013 1058   LDLCALC 115* 12/18/2013 1058   LDLDIRECT 118.4 08/13/2013 1502      Wt Readings from Last 3 Encounters:  04/18/15 193 lb 12.8 oz (87.907 kg)  04/14/15 195 lb (88.451 kg)  04/12/15 195 lb (88.451 kg)      Other studies Reviewed: Additional studies/ records that were reviewed today include and review of the records demonstrates:  2-D echo 04/15/15 Study Conclusions  - Left ventricle: The cavity size was normal. Wall thickness was   increased in a pattern of severe LVH. Systolic function was   vigorous. The estimated ejection  fraction was in the range of 65%   to 70%. Wall motion was normal; there were no regional wall   motion abnormalities. Doppler parameters are consistent with   abnormal left ventricular relaxation (grade 1 diastolic   dysfunction). Doppler parameters are consistent with high   ventricular filling pressure. - Aortic valve: Mildly calcified annulus. Trileaflet. - Mitral valve: Mildly thickened leaflets . There was trivial   regurgitation. - Right atrium: Central venous pressure (est): 3 mm Hg. - Tricuspid valve: There was trivial regurgitation. - Pulmonary arteries: PA peak pressure: 27 mm Hg (S). - Pericardium, extracardiac: There was no pericardial effusion.  Impressions:  - Severe LVH with LVEF 60-45%, grade 1 diastolic dysfunction with   increased filling pressures. Trivial mitral and tricuspid   regurgitation. PASP 27 mmHg.  Cath 2013  Angiographic Findings:  1. The left main coronary artery is free of significant atherosclerosis and bifurcates in the usual fashion into the left anterior descending artery and left circumflex coronary artery.   2. The left anterior descending artery is a large vessel that reaches the apex and generates two major diagonal branches. There is evidence of extensive luminal irregularities and mild calcification. There is a widely patent stent in the proximal vessel with no more than 20% in-stent restenosis. The first septal artery is "jailed" with approximately 80-90% ostial stenosis. No other hemodynamically meaningful stenoses are seen. 3. The left circumflex coronary artery is a large-size vessel on dominant vessel that generates 3 major oblique marginal arteries, of which the first is by far the largest  . There is evidence of extensive luminal irregularities and mild calcification. There is a widely patent stent in the proximal vessel with no more than 20-30% in-stent restenosis. No other hemodynamically meaningful stenoses are seen. 4. The right coronary  artery is a large-size dominant vessel that generates a medium-size posterior lateral ventricular system as well as a long and branching posterior descending artery. There is evidence of extensive luminal irregularities and mild calcification. Therapeutically 3 stents in the right coronary artery. The first is located in the midportion of the vessel and is  widely patent with minimal in stent restenosis. The other 2 stents appear to be located just upstream and just downstream of the right coronary artery bifurcation at the crux. There is approximately 40-50% in-stent restenosis in the proximal stent in a diffuse pattern. There is approximately 30% in-stent restenosis in the distal stent which appears to be smaller in caliber. The ostium the posterolateral artery is mildly "pinched" with a maximum diameter stenosis of about 50%. No other hemodynamically meaningful stenoses are seen.   5. The left ventricle is normal in size. The left ventricle systolic function is normal with an estimated ejection fraction of 65%. Regional wall motion abnormalities are not seen. No left ventricular thrombus is seen. There is no mitral insufficiency. The ascending aorta appears normal in caliber, with mild calcification. There is no aortic valve stenosis by pullback.    Hemodynamic findings:  Aortic pressure 123/51 (mean 78) mm Hg Left ventricle 144/81 with end-diastolic pressure of 23 mm Hg Pulmonary artery 47/20 (mean 30) mm Hg Pulmonary artery wedge pressure A wave 25, V wave 29, mean 26 mm Hg Right ventricle 50/10 with an end-diastolic pressure of 13 mm Hg Right atrium A wave 14, V wave 14,mean 12 mm Hg  Cardiac output is  4L per minute (cardiac index 1.9 L per minute per meter sq)  Systemic arterial oxygen saturation was 96% Right atrial  48% Pulmonary artery 48%  Pulmonary vascular resistance  200 DSC (424DSCI) Systemic vascular resistance    1420 DSC (3011 DSCI)    IMPRESSIONS:  Extensive coronary  atherosclerosis without hemodynamically significant stenoses in the large epicardial arteries. Previously stented areas are widely patent with mild to moderate in-stent restenosis There is evidence of acute on chronic diastolic left ventricular failure as well as moderate pulmonary arterial hypertension and mild right heart failure. Transpulmonary gradient is quite low suggesting that the pulmonary arterial hypertension is primarily related to diastolic heart failure RECOMMENDATION:  Continued coronary risk factor modification. Diuretics. Continue intravenous nitroglycerin overnight to assist diuresis.        Overall Impression:  Normal stress nuclear study. and Low risk stress nuclear study with mild apical breast attenuation.    Leonie Man, MD  08/14/2013 6:29 PM   ASSESSMENT AND PLAN:  Dizziness Patient continues to have dizziness despite hospitalization for hydration and adjustment of her medications. She is not significantly orthostatic here in the office today. Her blood pressure does drop a little when she goes from lying to sitting. She is upset about taking so many medications and feels like they're contributing. 2-D echo did show severe LVH. I told her we can stop the amlodipine and monitor her blood pressure closely. She does have occasional chest pains and reluctant to decrease it him nor at this time. Follow-up in one week.  Essential hypertension 2-D echo on 04/15/15 showed severe LVH with grade 1 diastolic dysfunction. She has normal systolic function. She has a little bit a drop in her blood pressure with change of position. We'll stop amlodipine and monitor closely.  Chronic diastolic heart failure No evidence of heart failure on exam.    Signed, Ermalinda Barrios, PA-C  04/27/2015 11:21 AM    [facility] [place], [place], [place]  [address] Phone: [phone]; Fax: [phone]

## 2015-04-27 NOTE — Assessment & Plan Note (Signed)
Patient continues to have dizziness despite hospitalization for hydration and adjustment of her medications. She is not significantly orthostatic here in the office today. Her blood pressure does drop a little when she goes from lying to sitting. She is upset about taking so many medications and feels like they're contributing. 2-D echo did show severe LVH. I told her we can stop the amlodipine and monitor her blood pressure closely. She does have occasional chest pains and reluctant to decrease it him nor at this time. Follow-up in one week.

## 2015-04-27 NOTE — Assessment & Plan Note (Signed)
No evidence of heart failure on exam 

## 2015-04-28 ENCOUNTER — Telehealth: Payer: Self-pay | Admitting: *Deleted

## 2015-04-28 DIAGNOSIS — I5032 Chronic diastolic (congestive) heart failure: Secondary | ICD-10-CM | POA: Diagnosis not present

## 2015-04-28 DIAGNOSIS — J449 Chronic obstructive pulmonary disease, unspecified: Secondary | ICD-10-CM | POA: Diagnosis not present

## 2015-04-28 DIAGNOSIS — I129 Hypertensive chronic kidney disease with stage 1 through stage 4 chronic kidney disease, or unspecified chronic kidney disease: Secondary | ICD-10-CM | POA: Diagnosis not present

## 2015-04-28 DIAGNOSIS — Z79899 Other long term (current) drug therapy: Secondary | ICD-10-CM

## 2015-04-28 DIAGNOSIS — I2510 Atherosclerotic heart disease of native coronary artery without angina pectoris: Secondary | ICD-10-CM | POA: Diagnosis not present

## 2015-04-28 DIAGNOSIS — N183 Chronic kidney disease, stage 3 (moderate): Secondary | ICD-10-CM | POA: Diagnosis not present

## 2015-04-28 DIAGNOSIS — E119 Type 2 diabetes mellitus without complications: Secondary | ICD-10-CM | POA: Diagnosis not present

## 2015-04-28 NOTE — Telephone Encounter (Signed)
-----   Message from Lendon Colonel, NP sent at 04/28/2015  4:24 PM EDT ----- Regarding: Labs Please stop torsemide now. Creatinine is rising from discharge to 2.48. BMET off of torsemide just before she is seen in one week.

## 2015-04-28 NOTE — Telephone Encounter (Signed)
Pt notified of  results and the need for lab work.

## 2015-04-29 ENCOUNTER — Telehealth: Payer: Self-pay | Admitting: Adult Health

## 2015-04-29 DIAGNOSIS — I129 Hypertensive chronic kidney disease with stage 1 through stage 4 chronic kidney disease, or unspecified chronic kidney disease: Secondary | ICD-10-CM | POA: Diagnosis not present

## 2015-04-29 DIAGNOSIS — E119 Type 2 diabetes mellitus without complications: Secondary | ICD-10-CM | POA: Diagnosis not present

## 2015-04-29 DIAGNOSIS — N183 Chronic kidney disease, stage 3 (moderate): Secondary | ICD-10-CM | POA: Diagnosis not present

## 2015-04-29 DIAGNOSIS — J449 Chronic obstructive pulmonary disease, unspecified: Secondary | ICD-10-CM | POA: Diagnosis not present

## 2015-04-29 DIAGNOSIS — I5032 Chronic diastolic (congestive) heart failure: Secondary | ICD-10-CM | POA: Diagnosis not present

## 2015-04-29 DIAGNOSIS — I2510 Atherosclerotic heart disease of native coronary artery without angina pectoris: Secondary | ICD-10-CM | POA: Diagnosis not present

## 2015-05-02 DIAGNOSIS — I5032 Chronic diastolic (congestive) heart failure: Secondary | ICD-10-CM | POA: Diagnosis not present

## 2015-05-02 DIAGNOSIS — E119 Type 2 diabetes mellitus without complications: Secondary | ICD-10-CM | POA: Diagnosis not present

## 2015-05-02 DIAGNOSIS — I129 Hypertensive chronic kidney disease with stage 1 through stage 4 chronic kidney disease, or unspecified chronic kidney disease: Secondary | ICD-10-CM | POA: Diagnosis not present

## 2015-05-02 DIAGNOSIS — J449 Chronic obstructive pulmonary disease, unspecified: Secondary | ICD-10-CM | POA: Diagnosis not present

## 2015-05-02 DIAGNOSIS — N183 Chronic kidney disease, stage 3 (moderate): Secondary | ICD-10-CM | POA: Diagnosis not present

## 2015-05-02 DIAGNOSIS — I2510 Atherosclerotic heart disease of native coronary artery without angina pectoris: Secondary | ICD-10-CM | POA: Diagnosis not present

## 2015-05-03 DIAGNOSIS — J449 Chronic obstructive pulmonary disease, unspecified: Secondary | ICD-10-CM | POA: Diagnosis not present

## 2015-05-03 DIAGNOSIS — N183 Chronic kidney disease, stage 3 (moderate): Secondary | ICD-10-CM | POA: Diagnosis not present

## 2015-05-03 DIAGNOSIS — I129 Hypertensive chronic kidney disease with stage 1 through stage 4 chronic kidney disease, or unspecified chronic kidney disease: Secondary | ICD-10-CM | POA: Diagnosis not present

## 2015-05-03 DIAGNOSIS — I5032 Chronic diastolic (congestive) heart failure: Secondary | ICD-10-CM | POA: Diagnosis not present

## 2015-05-03 DIAGNOSIS — I2510 Atherosclerotic heart disease of native coronary artery without angina pectoris: Secondary | ICD-10-CM | POA: Diagnosis not present

## 2015-05-03 DIAGNOSIS — E119 Type 2 diabetes mellitus without complications: Secondary | ICD-10-CM | POA: Diagnosis not present

## 2015-05-04 ENCOUNTER — Other Ambulatory Visit (HOSPITAL_COMMUNITY)
Admission: RE | Admit: 2015-05-04 | Discharge: 2015-05-04 | Disposition: A | Discharge status: Home / Self Care | Payer: Medicare Other | Source: Other Acute Inpatient Hospital | Attending: Cardiovascular Disease | Admitting: Cardiovascular Disease

## 2015-05-04 DIAGNOSIS — Z794 Long term (current) use of insulin: Secondary | ICD-10-CM | POA: Insufficient documentation

## 2015-05-04 DIAGNOSIS — N183 Chronic kidney disease, stage 3 (moderate): Secondary | ICD-10-CM | POA: Diagnosis not present

## 2015-05-04 DIAGNOSIS — E119 Type 2 diabetes mellitus without complications: Secondary | ICD-10-CM | POA: Diagnosis not present

## 2015-05-04 DIAGNOSIS — J449 Chronic obstructive pulmonary disease, unspecified: Secondary | ICD-10-CM | POA: Diagnosis not present

## 2015-05-04 DIAGNOSIS — I129 Hypertensive chronic kidney disease with stage 1 through stage 4 chronic kidney disease, or unspecified chronic kidney disease: Secondary | ICD-10-CM | POA: Diagnosis not present

## 2015-05-04 DIAGNOSIS — I5032 Chronic diastolic (congestive) heart failure: Secondary | ICD-10-CM | POA: Diagnosis not present

## 2015-05-04 DIAGNOSIS — I2510 Atherosclerotic heart disease of native coronary artery without angina pectoris: Secondary | ICD-10-CM | POA: Diagnosis not present

## 2015-05-04 LAB — BASIC METABOLIC PANEL
Anion gap: 10 (ref 5–15)
BUN: 34 mg/dL — AB (ref 6–20)
CHLORIDE: 103 mmol/L (ref 101–111)
CO2: 27 mmol/L (ref 22–32)
Calcium: 9.5 mg/dL (ref 8.9–10.3)
Creatinine, Ser: 1.71 mg/dL — ABNORMAL HIGH (ref 0.44–1.00)
GFR calc Af Amer: 32 mL/min — ABNORMAL LOW (ref >60)
GFR, EST NON AFRICAN AMERICAN: 28 mL/min — AB (ref >60)
GLUCOSE: 217 mg/dL — AB (ref 65–99)
Potassium: 4.7 mmol/L (ref 3.5–5.1)
Sodium: 140 mmol/L (ref 135–145)

## 2015-05-05 ENCOUNTER — Encounter: Payer: Self-pay | Admitting: Adult Health

## 2015-05-05 ENCOUNTER — Ambulatory Visit (INDEPENDENT_AMBULATORY_CARE_PROVIDER_SITE_OTHER): Payer: Medicare Other | Admitting: Adult Health

## 2015-05-05 VITALS — BP 132/86 | HR 80 | Ht 67.0 in | Wt 205.0 lb

## 2015-05-05 DIAGNOSIS — I2510 Atherosclerotic heart disease of native coronary artery without angina pectoris: Secondary | ICD-10-CM | POA: Diagnosis not present

## 2015-05-05 DIAGNOSIS — J449 Chronic obstructive pulmonary disease, unspecified: Secondary | ICD-10-CM | POA: Diagnosis not present

## 2015-05-05 DIAGNOSIS — I129 Hypertensive chronic kidney disease with stage 1 through stage 4 chronic kidney disease, or unspecified chronic kidney disease: Secondary | ICD-10-CM | POA: Diagnosis not present

## 2015-05-05 DIAGNOSIS — R42 Dizziness and giddiness: Secondary | ICD-10-CM | POA: Diagnosis not present

## 2015-05-05 DIAGNOSIS — E119 Type 2 diabetes mellitus without complications: Secondary | ICD-10-CM | POA: Diagnosis not present

## 2015-05-05 DIAGNOSIS — N183 Chronic kidney disease, stage 3 (moderate): Secondary | ICD-10-CM | POA: Diagnosis not present

## 2015-05-05 DIAGNOSIS — I5032 Chronic diastolic (congestive) heart failure: Secondary | ICD-10-CM | POA: Diagnosis not present

## 2015-05-05 NOTE — Progress Notes (Deleted)
Name: Krystal [name]    DOB: February 13, 1936  Age: 79 y.o.  MR#: [id number]       PCP:  [name], MD      Insurance: Payor: MEDICARE / Plan: MEDICARE PART A AND B / Product Type: *No Product type* /   CC:    Chief Complaint  Patient presents with  . Coronary Artery Disease    Stents X 3.   . Hypertension    VS Filed Vitals:   05/05/15 1323  BP: 132/86  Pulse: 80  Height: 5\' 7"  (1.702 m)  Weight: 205 lb (92.987 kg)  SpO2: 95%    Weights Current Weight  05/05/15 205 lb (92.987 kg)  04/27/15 195 lb (88.451 kg)  04/18/15 193 lb 12.8 oz (87.907 kg)    Blood Pressure  BP Readings from Last 3 Encounters:  05/05/15 132/86  04/27/15 130/70  04/18/15 123/47     Admit date:  (Not on file) Last encounter with RMR:  04/29/2015   Allergy Morphine and Penicillins  Current Outpatient Prescriptions  Medication Sig Dispense Refill  . aspirin 81 MG EC tablet Take 81 mg by mouth every morning.     . budesonide-formoterol (SYMBICORT) 160-4.5 MCG/ACT inhaler Inhale 2 puffs into the lungs 2 (two) times daily as needed (for shortness of breath).    . calcitRIOL (ROCALTROL) 0.25 MCG capsule Take 0.25 mcg by mouth daily. Frequency change. Now taking everyday.    . clobetasol ointment (TEMOVATE) 6.64 % Apply 1 application topically 2 (two) times daily.    . clopidogrel (PLAVIX) 75 MG tablet TAKE 1 TABLET ONCE DAILY. 30 tablet 6  . ezetimibe (ZETIA) 10 MG tablet Take 10 mg by mouth every morning.     . famotidine (PEPCID) 20 MG tablet TAKE (1) TABLET TWICE DAILY. 60 tablet 3  . ferrous sulfate 325 (65 FE) MG tablet Take 325 mg by mouth daily.     . Fluticasone-Salmeterol (ADVAIR DISKUS) 250-50 MCG/DOSE AEPB Inhale 1 puff into the lungs 2 (two) times daily.     Marland Kitchen HYDROcodone-acetaminophen (VICODIN) 5-500 MG per tablet Take 1 tablet by mouth 2 (two) times daily as needed for pain.     Marland Kitchen insulin glargine (LANTUS) 100 UNIT/ML injection Inject 40 Units into the skin 2 (two) times daily.     .  isosorbide mononitrate (IMDUR) 60 MG 24 hr tablet TAKE 1&1/2 TABLETS BY MOUTH EVERY MORNING AND 1/2 TABLET IN THE EVENING. 60 tablet 6  . levocetirizine (XYZAL) 5 MG tablet Take 5 mg by mouth every evening.    . Liraglutide (VICTOZA) 18 MG/3ML SOPN Inject 1.8 mLs into the skin daily. As directed    . lisinopril (PRINIVIL,ZESTRIL) 10 MG tablet Take 1 tablet (10 mg total) by mouth daily. 90 tablet 3  . montelukast (SINGULAIR) 10 MG tablet Take 10 mg by mouth at bedtime.    . nebivolol (BYSTOLIC) 5 MG tablet Take 1 tab 2 times daily 60 tablet 3  . NITROSTAT 0.4 MG SL tablet PLACE ONE (1) TABLET UNDER TONGUE EVERY 5 MINUTES UP TO (3) DOSES AS NEEDED FOR CHEST PAIN. 25 tablet 0  . NOVOLOG FLEXPEN 100 UNIT/ML FlexPen USE 25 UNITS AT BREAKFAST, 20 UNITS AT LUNCH AND 35 UNITS AT SUPPER SUB-Q. (Patient taking differently: 40 units in the morning) 30 mL 2  . nystatin-triamcinolone ointment (MYCOLOG) Apply 1 application topically 2 (two) times daily. To affected area. 30 g prn  . OXYGEN-HELIUM IN Inhale into the lungs. 2 liters continuous    .  POLYETHYLENE GLYCOL 3350-GRX PO Take by mouth as needed.    . rosuvastatin (CRESTOR) 20 MG tablet Take 20 mg by mouth every evening.    . tiotropium (SPIRIVA) 18 MCG inhalation capsule Place 18 mcg into inhaler and inhale daily.    Marland Kitchen torsemide (DEMADEX) 20 MG tablet Take 1 tablet (20 mg total) by mouth daily.     No current facility-administered medications for this visit.    Discontinued Meds:   There are no discontinued medications.  Patient Active Problem List   Diagnosis Date Noted  . Acute kidney injury 04/15/2015  . Chronic diastolic heart failure 41/66/0630  . Essential hypertension 04/15/2015  . Chronic respiratory failure with hypoxia 04/15/2015  . COPD with chronic bronchitis 04/15/2015  . Near syncope 04/15/2015  . Type II diabetes mellitus with nephropathy 04/15/2015  . Dizziness and giddiness 04/14/2015  . Dizziness 04/14/2015  . Abnormal  involuntary movements 04/12/2015  . Diarrhea 10/20/2014  . CHF, acute on chronic 03/05/2014  . Vaginal bleeding, abnormal 01/18/2014  . Leg edema 08/15/2013  . Chronic kidney disease, unspecified 06/16/2013  . Complete heart block, transiet 01/26/2013  . CKD (chronic kidney disease) stage 3, GFR 30-59 ml/min 09/18/2012  . Acute on chronic diastolic heart failure - with SSx of Unstable Angina 09/13/2012  . OBESITY 05/20/2009  . PERIPHERAL NEUROPATHY 05/20/2009  . NEVUS, ATYPICAL 05/06/2009  . HYPERTENSION, PULMONARY - partially due to LV diastolic dysfunction 16/11/930  . BRONCHITIS, CHRONIC 12/16/2008  . DIASTOLIC DYSFUNCTION, normal LVF 04/30/2008  . Obstructive sleep apnea 04/30/2008  . Deficiency anemia 09/23/2007  . [place] DISEASE, LUMBOSACRAL SPINE 04/08/2007  . HYPERLIPIDEMIA 10/29/2006  . Coronary atherosclerosis 10/29/2006  . ALLERGIC RHINITIS 10/29/2006  . GERD 10/29/2006  . CONSTIPATION, OTHER 10/29/2006  . OSTEOARTHRITIS 10/29/2006  . COLONIC POLYPS, HX OF 10/29/2006    LABS    Component Value Date/Time   NA 140 05/04/2015 1415   NA 138 04/27/2015 1248   NA 140 04/18/2015 0706   NA 136 11/01/2014 1509   K 4.7 05/04/2015 1415   K 4.7 04/27/2015 1248   K 4.4 04/18/2015 0706   CL 103 05/04/2015 1415   CL 99 04/27/2015 1248   CL 98* 04/18/2015 0706   CO2 27 05/04/2015 1415   CO2 29 04/27/2015 1248   CO2 30 04/18/2015 0706   GLUCOSE 217* 05/04/2015 1415   GLUCOSE 244* 04/27/2015 1248   GLUCOSE 215* 04/18/2015 0706   GLUCOSE 336* 11/01/2014 1509   BUN 34* 05/04/2015 1415   BUN 78* 04/27/2015 1248   BUN 46* 04/18/2015 0706   BUN 44* 11/01/2014 1509   CREATININE 1.71* 05/04/2015 1415   CREATININE 2.48* 04/27/2015 1248   CREATININE 2.00* 04/18/2015 0706   CREATININE 2.03* 04/17/2015 0552   CREATININE 1.52* 05/13/2014 1051   CREATININE 2.62* 03/23/2014 1149   CALCIUM 9.5 05/04/2015 1415   CALCIUM 10.1 04/27/2015 1248   CALCIUM 9.8 04/18/2015  0706   CALCIUM 8.6 03/11/2014 1102   GFRNONAA 28* 05/04/2015 1415   GFRNONAA 23* 04/18/2015 0706   GFRNONAA 23* 04/17/2015 0552   GFRAA 32* 05/04/2015 1415   GFRAA 27* 04/18/2015 0706   GFRAA 26* 04/17/2015 0552   CMP     Component Value Date/Time   NA 140 05/04/2015 1415   NA 136 11/01/2014 1509   K 4.7 05/04/2015 1415   CL 103 05/04/2015 1415   CO2 27 05/04/2015 1415   GLUCOSE 217* 05/04/2015 1415   GLUCOSE 336* 11/01/2014 1509   BUN  34* 05/04/2015 1415   BUN 44* 11/01/2014 1509   CREATININE 1.71* 05/04/2015 1415   CREATININE 2.48* 04/27/2015 1248   CALCIUM 9.5 05/04/2015 1415   CALCIUM 8.6 03/11/2014 1102   PROT 8.2* 04/14/2015 1733   PROT 7.2 11/01/2014 1509   ALBUMIN 4.3 04/14/2015 1733   AST 20 04/14/2015 1733   ALT 18 04/14/2015 1733   ALKPHOS 73 04/14/2015 1733   BILITOT 0.5 04/14/2015 1733   GFRNONAA 28* 05/04/2015 1415   GFRAA 32* 05/04/2015 1415       Component Value Date/Time   WBC 7.3 04/17/2015 0552   WBC 7.1 04/16/2015 0554   WBC 8.6 04/15/2015 0614   HGB 10.3* 04/17/2015 0552   HGB 10.8* 04/16/2015 0554   HGB 11.0* 04/15/2015 0614   HCT 34.0* 04/17/2015 0552   HCT 36.0 04/16/2015 0554   HCT 36.2 04/15/2015 0614   MCV 77.4* 04/17/2015 0552   MCV 76.6* 04/16/2015 0554   MCV 75.7* 04/15/2015 0614    Lipid Panel     Component Value Date/Time   CHOL 191 12/18/2013 1058   TRIG 186* 12/18/2013 1058   HDL 39* 12/18/2013 1058   CHOLHDL 4.9 12/18/2013 1058   VLDL 37 12/18/2013 1058   LDLCALC 115* 12/18/2013 1058   LDLDIRECT 118.4 08/13/2013 1502    ABG    Component Value Date/Time   PHART 7.354 09/12/2012 1309   PCO2ART 47.6* 09/12/2012 1309   PO2ART 88.0 09/12/2012 1309   HCO3 27.7* 09/12/2012 1314   TCO2 29 09/12/2012 1314   O2SAT 48.0 09/12/2012 1314     Lab Results  Component Value Date   TSH 2.617 04/15/2015   BNP (last 3 results) No results for input(s): BNP in the last 8760 hours.  ProBNP (last 3 results) No results for  input(s): PROBNP in the last 8760 hours.  Cardiac Panel (last 3 results) No results for input(s): CKTOTAL, CKMB, TROPONINI, RELINDX in the last 72 hours.  Iron/TIBC/Ferritin/ %Sat    Component Value Date/Time   IRON 55 05/21/2009 0026   TIBC 353 05/21/2009 0026   FERRITIN 25 06/26/2006   IRONPCTSAT 16* 05/21/2009 0026     EKG Orders placed or performed in visit on 04/27/15  . EKG 12-Lead     Prior Assessment and Plan Problem List as of 05/05/2015      Cardiovascular and Mediastinum   Coronary atherosclerosis   HYPERTENSION, PULMONARY - partially due to LV diastolic dysfunction   DIASTOLIC DYSFUNCTION, normal LVF   Acute on chronic diastolic heart failure - with SSx of Unstable Angina   Complete heart block, transiet   CHF, acute on chronic   Chronic diastolic heart failure   Last Assessment & Plan 04/27/2015 Office Visit Written 04/27/2015  2:42 PM by Imogene Burn, PA-C    No evidence of heart failure on exam.      Essential hypertension   Last Assessment & Plan 04/27/2015 Office Visit Written 04/27/2015  2:42 PM by Imogene Burn, PA-C    2-D echo on 04/15/15 showed severe LVH with grade 1 diastolic dysfunction. She has normal systolic function. She has a little bit a drop in her blood pressure with change of position. We'll stop amlodipine and monitor closely.      Near syncope     Respiratory   BRONCHITIS, CHRONIC   ALLERGIC RHINITIS   Obstructive sleep apnea   Chronic respiratory failure with hypoxia   COPD with chronic bronchitis     Digestive   GERD  CONSTIPATION, OTHER     Endocrine   Type II diabetes mellitus with nephropathy     Nervous and Auditory   PERIPHERAL NEUROPATHY   Abnormal involuntary movements     Musculoskeletal and Integument   NEVUS, ATYPICAL   OSTEOARTHRITIS   DEGENERATIVE DISC DISEASE, LUMBOSACRAL SPINE     Genitourinary   CKD (chronic kidney disease) stage 3, GFR 30-59 ml/min   Chronic kidney disease, unspecified   Acute kidney  injury     Other   HYPERLIPIDEMIA   OBESITY   Deficiency anemia   COLONIC POLYPS, HX OF   Last Assessment & Plan 09/27/2011 Office Visit Written 09/27/2011  4:29 PM by Danie Binder, MD    TCS DEC 2012 DUE TO CARDIOPULMONARY COMLICATIONS WITH MODERATE SEDATION. HOLD IRON FOR 7 DAYS. HOLD BYETTA AND GLIPIZIDE AM OF PROCEDURE. 1/2 HUMALOG NIGHT PRIOR AND HOLD AM OF OPV PRN       Leg edema   Vaginal bleeding, abnormal   Diarrhea   Last Assessment & Plan 11/25/2014 Office Visit Edited 11/25/2014 12:24 PM by Danie Binder, MD    SX IMPROVED AVOIDING DAIRY FOR THE MOST PART. TCS UTD. HX: C DIFF COLITIS. ABOUT TO RECIVE ABX FOR URI. INTENTIONAL WEIGHT LOSS  NO INDICATION FOR ENDOSCOPY AT THIS TIME. ADD PROBIOTIC DAILY. AVOID DAIRY FOLLOW UP IN 4 MOS.           Dizziness and giddiness   Dizziness   Last Assessment & Plan 04/27/2015 Office Visit Written 04/27/2015  2:41 PM by Imogene Burn, PA-C    Patient continues to have dizziness despite hospitalization for hydration and adjustment of her medications. She is not significantly orthostatic here in the office today. Her blood pressure does drop a little when she goes from lying to sitting. She is upset about taking so many medications and feels like they're contributing. 2-D echo did show severe LVH. I told her we can stop the amlodipine and monitor her blood pressure closely. She does have occasional chest pains and reluctant to decrease it him nor at this time. Follow-up in one week.          Imaging: Dg Chest 2 View  04/16/2015   CLINICAL DATA:  Dizzness, coughing, sob, per patient. History of cancer, diabetes, htn  EXAM: CHEST  2 VIEW  COMPARISON:  03/28/2015  FINDINGS: Cardiac silhouette normal in size and configuration. No mediastinal or hilar masses or evidence of adenopathy.  Clear lungs.  No pleural effusion or pneumothorax.  Bony thorax is intact.  IMPRESSION: No active cardiopulmonary disease.   Electronically Signed   By:  Lajean Manes M.D.   On: 04/16/2015 11:43   Ct Head Wo Contrast  04/17/2015   CLINICAL DATA:  Dizziness.  EXAM: CT HEAD WITHOUT CONTRAST  TECHNIQUE: Contiguous axial images were obtained from the base of the skull through the vertex without intravenous contrast.  COMPARISON:  10/22/2014  FINDINGS: Mild patchy low attenuation throughout the subcortical and periventricular white matter is noted consistent with chronic microvascular disease. The sulci and ventricles appear normal. No evidence for acute brain infarct. No abnormal extra-axial fluid collections, intracranial hemorrhage or mass. The paranasal sinuses are clear. Normal appearance of the mastoid air cells. The calvarium is intact.  IMPRESSION: 1. No acute intracranial abnormalities noted. 2. Small vessel ischemic disease.   Electronically Signed   By: Kerby Moors M.D.   On: 04/17/2015 15:18

## 2015-05-05 NOTE — Patient Instructions (Signed)
Your physician wants you to follow-up in: 6 months with K.Lawrence, NP. You will receive a reminder letter in the mail two months in advance. If you don't receive a letter, please call our office to schedule the follow-up appointment.  Your physician recommends that you continue on your current medications as directed. Please refer to the Current Medication list given to you today.  Thank you for choosing [facility]!   

## 2015-05-05 NOTE — Progress Notes (Signed)
    Disposition:   FU with 6 months.  Signed, Jory Sims, NP  05/05/2015 4:17 PM    [place] [address], [place], [place] [address]  Phone: [phone]; Fax: [phone]

## 2015-05-06 DIAGNOSIS — I5032 Chronic diastolic (congestive) heart failure: Secondary | ICD-10-CM | POA: Diagnosis not present

## 2015-05-06 DIAGNOSIS — I2510 Atherosclerotic heart disease of native coronary artery without angina pectoris: Secondary | ICD-10-CM | POA: Diagnosis not present

## 2015-05-06 DIAGNOSIS — I129 Hypertensive chronic kidney disease with stage 1 through stage 4 chronic kidney disease, or unspecified chronic kidney disease: Secondary | ICD-10-CM | POA: Diagnosis not present

## 2015-05-06 DIAGNOSIS — N183 Chronic kidney disease, stage 3 (moderate): Secondary | ICD-10-CM | POA: Diagnosis not present

## 2015-05-06 DIAGNOSIS — J449 Chronic obstructive pulmonary disease, unspecified: Secondary | ICD-10-CM | POA: Diagnosis not present

## 2015-05-06 DIAGNOSIS — E119 Type 2 diabetes mellitus without complications: Secondary | ICD-10-CM | POA: Diagnosis not present

## 2015-05-10 DIAGNOSIS — I129 Hypertensive chronic kidney disease with stage 1 through stage 4 chronic kidney disease, or unspecified chronic kidney disease: Secondary | ICD-10-CM | POA: Diagnosis not present

## 2015-05-10 DIAGNOSIS — I5032 Chronic diastolic (congestive) heart failure: Secondary | ICD-10-CM | POA: Diagnosis not present

## 2015-05-10 DIAGNOSIS — N183 Chronic kidney disease, stage 3 (moderate): Secondary | ICD-10-CM | POA: Diagnosis not present

## 2015-05-10 DIAGNOSIS — E119 Type 2 diabetes mellitus without complications: Secondary | ICD-10-CM | POA: Diagnosis not present

## 2015-05-10 DIAGNOSIS — J449 Chronic obstructive pulmonary disease, unspecified: Secondary | ICD-10-CM | POA: Diagnosis not present

## 2015-05-10 DIAGNOSIS — I2510 Atherosclerotic heart disease of native coronary artery without angina pectoris: Secondary | ICD-10-CM | POA: Diagnosis not present

## 2015-05-12 ENCOUNTER — Ambulatory Visit: Payer: Medicare Other | Admitting: Gastroenterology

## 2015-05-13 DIAGNOSIS — I129 Hypertensive chronic kidney disease with stage 1 through stage 4 chronic kidney disease, or unspecified chronic kidney disease: Secondary | ICD-10-CM | POA: Diagnosis not present

## 2015-05-13 DIAGNOSIS — N183 Chronic kidney disease, stage 3 (moderate): Secondary | ICD-10-CM | POA: Diagnosis not present

## 2015-05-13 DIAGNOSIS — J449 Chronic obstructive pulmonary disease, unspecified: Secondary | ICD-10-CM | POA: Diagnosis not present

## 2015-05-13 DIAGNOSIS — E119 Type 2 diabetes mellitus without complications: Secondary | ICD-10-CM | POA: Diagnosis not present

## 2015-05-13 DIAGNOSIS — I2510 Atherosclerotic heart disease of native coronary artery without angina pectoris: Secondary | ICD-10-CM | POA: Diagnosis not present

## 2015-05-13 DIAGNOSIS — I5032 Chronic diastolic (congestive) heart failure: Secondary | ICD-10-CM | POA: Diagnosis not present

## 2015-05-17 DIAGNOSIS — I2510 Atherosclerotic heart disease of native coronary artery without angina pectoris: Secondary | ICD-10-CM | POA: Diagnosis not present

## 2015-05-17 DIAGNOSIS — I5032 Chronic diastolic (congestive) heart failure: Secondary | ICD-10-CM | POA: Diagnosis not present

## 2015-05-17 DIAGNOSIS — J449 Chronic obstructive pulmonary disease, unspecified: Secondary | ICD-10-CM | POA: Diagnosis not present

## 2015-05-17 DIAGNOSIS — I951 Orthostatic hypotension: Secondary | ICD-10-CM | POA: Diagnosis not present

## 2015-05-17 DIAGNOSIS — E119 Type 2 diabetes mellitus without complications: Secondary | ICD-10-CM | POA: Diagnosis not present

## 2015-05-17 DIAGNOSIS — I129 Hypertensive chronic kidney disease with stage 1 through stage 4 chronic kidney disease, or unspecified chronic kidney disease: Secondary | ICD-10-CM | POA: Diagnosis not present

## 2015-05-17 DIAGNOSIS — G4730 Sleep apnea, unspecified: Secondary | ICD-10-CM | POA: Diagnosis not present

## 2015-05-17 DIAGNOSIS — N183 Chronic kidney disease, stage 3 (moderate): Secondary | ICD-10-CM | POA: Diagnosis not present

## 2015-05-17 DIAGNOSIS — I10 Essential (primary) hypertension: Secondary | ICD-10-CM | POA: Diagnosis not present

## 2015-05-18 DIAGNOSIS — J449 Chronic obstructive pulmonary disease, unspecified: Secondary | ICD-10-CM | POA: Diagnosis not present

## 2015-05-18 DIAGNOSIS — I129 Hypertensive chronic kidney disease with stage 1 through stage 4 chronic kidney disease, or unspecified chronic kidney disease: Secondary | ICD-10-CM | POA: Diagnosis not present

## 2015-05-18 DIAGNOSIS — E119 Type 2 diabetes mellitus without complications: Secondary | ICD-10-CM | POA: Diagnosis not present

## 2015-05-18 DIAGNOSIS — I2510 Atherosclerotic heart disease of native coronary artery without angina pectoris: Secondary | ICD-10-CM | POA: Diagnosis not present

## 2015-05-18 DIAGNOSIS — I5032 Chronic diastolic (congestive) heart failure: Secondary | ICD-10-CM | POA: Diagnosis not present

## 2015-05-18 DIAGNOSIS — N183 Chronic kidney disease, stage 3 (moderate): Secondary | ICD-10-CM | POA: Diagnosis not present

## 2015-05-19 DIAGNOSIS — J449 Chronic obstructive pulmonary disease, unspecified: Secondary | ICD-10-CM | POA: Diagnosis not present

## 2015-05-19 DIAGNOSIS — I2510 Atherosclerotic heart disease of native coronary artery without angina pectoris: Secondary | ICD-10-CM | POA: Diagnosis not present

## 2015-05-19 DIAGNOSIS — E119 Type 2 diabetes mellitus without complications: Secondary | ICD-10-CM | POA: Diagnosis not present

## 2015-05-19 DIAGNOSIS — I5032 Chronic diastolic (congestive) heart failure: Secondary | ICD-10-CM | POA: Diagnosis not present

## 2015-05-19 DIAGNOSIS — N183 Chronic kidney disease, stage 3 (moderate): Secondary | ICD-10-CM | POA: Diagnosis not present

## 2015-05-19 DIAGNOSIS — I129 Hypertensive chronic kidney disease with stage 1 through stage 4 chronic kidney disease, or unspecified chronic kidney disease: Secondary | ICD-10-CM | POA: Diagnosis not present

## 2015-05-20 DIAGNOSIS — I2510 Atherosclerotic heart disease of native coronary artery without angina pectoris: Secondary | ICD-10-CM | POA: Diagnosis not present

## 2015-05-20 DIAGNOSIS — I5032 Chronic diastolic (congestive) heart failure: Secondary | ICD-10-CM | POA: Diagnosis not present

## 2015-05-20 DIAGNOSIS — E119 Type 2 diabetes mellitus without complications: Secondary | ICD-10-CM | POA: Diagnosis not present

## 2015-05-20 DIAGNOSIS — I129 Hypertensive chronic kidney disease with stage 1 through stage 4 chronic kidney disease, or unspecified chronic kidney disease: Secondary | ICD-10-CM | POA: Diagnosis not present

## 2015-05-20 DIAGNOSIS — J449 Chronic obstructive pulmonary disease, unspecified: Secondary | ICD-10-CM | POA: Diagnosis not present

## 2015-05-20 DIAGNOSIS — N183 Chronic kidney disease, stage 3 (moderate): Secondary | ICD-10-CM | POA: Diagnosis not present

## 2015-05-24 DIAGNOSIS — M10079 Idiopathic gout, unspecified ankle and foot: Secondary | ICD-10-CM | POA: Diagnosis not present

## 2015-05-24 DIAGNOSIS — M10072 Idiopathic gout, left ankle and foot: Secondary | ICD-10-CM | POA: Diagnosis not present

## 2015-05-26 DIAGNOSIS — I129 Hypertensive chronic kidney disease with stage 1 through stage 4 chronic kidney disease, or unspecified chronic kidney disease: Secondary | ICD-10-CM | POA: Diagnosis not present

## 2015-05-26 DIAGNOSIS — I5032 Chronic diastolic (congestive) heart failure: Secondary | ICD-10-CM | POA: Diagnosis not present

## 2015-05-26 DIAGNOSIS — J449 Chronic obstructive pulmonary disease, unspecified: Secondary | ICD-10-CM | POA: Diagnosis not present

## 2015-05-26 DIAGNOSIS — I2510 Atherosclerotic heart disease of native coronary artery without angina pectoris: Secondary | ICD-10-CM | POA: Diagnosis not present

## 2015-05-26 DIAGNOSIS — N183 Chronic kidney disease, stage 3 (moderate): Secondary | ICD-10-CM | POA: Diagnosis not present

## 2015-05-26 DIAGNOSIS — E119 Type 2 diabetes mellitus without complications: Secondary | ICD-10-CM | POA: Diagnosis not present

## 2015-05-27 DIAGNOSIS — E1122 Type 2 diabetes mellitus with diabetic chronic kidney disease: Secondary | ICD-10-CM | POA: Diagnosis not present

## 2015-05-27 DIAGNOSIS — E782 Mixed hyperlipidemia: Secondary | ICD-10-CM | POA: Diagnosis not present

## 2015-05-27 DIAGNOSIS — N183 Chronic kidney disease, stage 3 (moderate): Secondary | ICD-10-CM | POA: Diagnosis not present

## 2015-05-27 DIAGNOSIS — I5032 Chronic diastolic (congestive) heart failure: Secondary | ICD-10-CM | POA: Diagnosis not present

## 2015-05-27 DIAGNOSIS — I2510 Atherosclerotic heart disease of native coronary artery without angina pectoris: Secondary | ICD-10-CM | POA: Diagnosis not present

## 2015-05-27 DIAGNOSIS — I10 Essential (primary) hypertension: Secondary | ICD-10-CM | POA: Diagnosis not present

## 2015-05-27 DIAGNOSIS — J449 Chronic obstructive pulmonary disease, unspecified: Secondary | ICD-10-CM | POA: Diagnosis not present

## 2015-05-27 DIAGNOSIS — E6609 Other obesity due to excess calories: Secondary | ICD-10-CM | POA: Diagnosis not present

## 2015-05-27 DIAGNOSIS — E119 Type 2 diabetes mellitus without complications: Secondary | ICD-10-CM | POA: Diagnosis not present

## 2015-05-27 DIAGNOSIS — I129 Hypertensive chronic kidney disease with stage 1 through stage 4 chronic kidney disease, or unspecified chronic kidney disease: Secondary | ICD-10-CM | POA: Diagnosis not present

## 2015-05-31 DIAGNOSIS — J449 Chronic obstructive pulmonary disease, unspecified: Secondary | ICD-10-CM | POA: Diagnosis not present

## 2015-05-31 DIAGNOSIS — E119 Type 2 diabetes mellitus without complications: Secondary | ICD-10-CM | POA: Diagnosis not present

## 2015-05-31 DIAGNOSIS — I5032 Chronic diastolic (congestive) heart failure: Secondary | ICD-10-CM | POA: Diagnosis not present

## 2015-05-31 DIAGNOSIS — N183 Chronic kidney disease, stage 3 (moderate): Secondary | ICD-10-CM | POA: Diagnosis not present

## 2015-05-31 DIAGNOSIS — I2510 Atherosclerotic heart disease of native coronary artery without angina pectoris: Secondary | ICD-10-CM | POA: Diagnosis not present

## 2015-05-31 DIAGNOSIS — I129 Hypertensive chronic kidney disease with stage 1 through stage 4 chronic kidney disease, or unspecified chronic kidney disease: Secondary | ICD-10-CM | POA: Diagnosis not present

## 2015-06-02 ENCOUNTER — Other Ambulatory Visit: Payer: Self-pay | Admitting: Cardiovascular Disease

## 2015-06-02 DIAGNOSIS — I2510 Atherosclerotic heart disease of native coronary artery without angina pectoris: Secondary | ICD-10-CM | POA: Diagnosis not present

## 2015-06-02 DIAGNOSIS — N183 Chronic kidney disease, stage 3 (moderate): Secondary | ICD-10-CM | POA: Diagnosis not present

## 2015-06-02 DIAGNOSIS — I129 Hypertensive chronic kidney disease with stage 1 through stage 4 chronic kidney disease, or unspecified chronic kidney disease: Secondary | ICD-10-CM | POA: Diagnosis not present

## 2015-06-02 DIAGNOSIS — I5032 Chronic diastolic (congestive) heart failure: Secondary | ICD-10-CM | POA: Diagnosis not present

## 2015-06-02 DIAGNOSIS — J449 Chronic obstructive pulmonary disease, unspecified: Secondary | ICD-10-CM | POA: Diagnosis not present

## 2015-06-02 DIAGNOSIS — E119 Type 2 diabetes mellitus without complications: Secondary | ICD-10-CM | POA: Diagnosis not present

## 2015-06-03 DIAGNOSIS — I10 Essential (primary) hypertension: Secondary | ICD-10-CM | POA: Diagnosis not present

## 2015-06-03 DIAGNOSIS — E782 Mixed hyperlipidemia: Secondary | ICD-10-CM | POA: Diagnosis not present

## 2015-06-03 DIAGNOSIS — E1159 Type 2 diabetes mellitus with other circulatory complications: Secondary | ICD-10-CM | POA: Diagnosis not present

## 2015-06-03 DIAGNOSIS — E1122 Type 2 diabetes mellitus with diabetic chronic kidney disease: Secondary | ICD-10-CM | POA: Diagnosis not present

## 2015-06-03 DIAGNOSIS — E6609 Other obesity due to excess calories: Secondary | ICD-10-CM | POA: Diagnosis not present

## 2015-06-06 DIAGNOSIS — E119 Type 2 diabetes mellitus without complications: Secondary | ICD-10-CM | POA: Diagnosis not present

## 2015-06-06 DIAGNOSIS — J449 Chronic obstructive pulmonary disease, unspecified: Secondary | ICD-10-CM | POA: Diagnosis not present

## 2015-06-06 DIAGNOSIS — I2510 Atherosclerotic heart disease of native coronary artery without angina pectoris: Secondary | ICD-10-CM | POA: Diagnosis not present

## 2015-06-06 DIAGNOSIS — I129 Hypertensive chronic kidney disease with stage 1 through stage 4 chronic kidney disease, or unspecified chronic kidney disease: Secondary | ICD-10-CM | POA: Diagnosis not present

## 2015-06-06 DIAGNOSIS — I5032 Chronic diastolic (congestive) heart failure: Secondary | ICD-10-CM | POA: Diagnosis not present

## 2015-06-06 DIAGNOSIS — N183 Chronic kidney disease, stage 3 (moderate): Secondary | ICD-10-CM | POA: Diagnosis not present

## 2015-06-07 DIAGNOSIS — N183 Chronic kidney disease, stage 3 (moderate): Secondary | ICD-10-CM | POA: Diagnosis not present

## 2015-06-07 DIAGNOSIS — E1122 Type 2 diabetes mellitus with diabetic chronic kidney disease: Secondary | ICD-10-CM | POA: Diagnosis not present

## 2015-06-07 DIAGNOSIS — H8149 Vertigo of central origin, unspecified ear: Secondary | ICD-10-CM | POA: Diagnosis not present

## 2015-06-07 DIAGNOSIS — I5032 Chronic diastolic (congestive) heart failure: Secondary | ICD-10-CM | POA: Diagnosis not present

## 2015-06-08 ENCOUNTER — Encounter: Payer: Self-pay | Admitting: Gastroenterology

## 2015-06-08 ENCOUNTER — Ambulatory Visit (INDEPENDENT_AMBULATORY_CARE_PROVIDER_SITE_OTHER): Payer: Medicare Other | Admitting: Gastroenterology

## 2015-06-08 VITALS — BP 148/69 | HR 98 | Temp 97.1°F | Ht 67.0 in | Wt 202.8 lb

## 2015-06-08 DIAGNOSIS — K219 Gastro-esophageal reflux disease without esophagitis: Secondary | ICD-10-CM | POA: Diagnosis not present

## 2015-06-08 DIAGNOSIS — D631 Anemia in chronic kidney disease: Secondary | ICD-10-CM | POA: Diagnosis not present

## 2015-06-08 DIAGNOSIS — N189 Chronic kidney disease, unspecified: Secondary | ICD-10-CM | POA: Diagnosis not present

## 2015-06-08 DIAGNOSIS — K648 Other hemorrhoids: Secondary | ICD-10-CM | POA: Diagnosis not present

## 2015-06-08 DIAGNOSIS — D649 Anemia, unspecified: Secondary | ICD-10-CM | POA: Insufficient documentation

## 2015-06-08 DIAGNOSIS — I2510 Atherosclerotic heart disease of native coronary artery without angina pectoris: Secondary | ICD-10-CM

## 2015-06-08 DIAGNOSIS — K5909 Other constipation: Secondary | ICD-10-CM

## 2015-06-08 NOTE — Assessment & Plan Note (Addendum)
MICROCYTIC-NO BRBPR OR MELENA.  NEEDS EGD W/ MAC DUE TO POLYPHARMACY. PT WOULD LIKE TO WAIT UNTIL AFTER SEP 2016 CONTINUE PLAVIX. CONSIDER CT SCAN OF ABD/PELVIS DUE TO POSTPRANDIAL BLOATING/ANEMIA IF NO SOURCE FOR DYSPEPSIA IDENTIFIED. FOLLOW UP IN 4 MOS.

## 2015-06-08 NOTE — Patient Instructions (Signed)
USE LACTASE 3 PILLS WITH MEALS TO REDUCE BLOATING AND GAS.  USE LACTAID OR LACTOSE FREE ICE CREAM.  COMPLETE UPPER ENDOSCOPY IN SEP 2016  CONTINUE PEPCID.  FOLLOW UP IN 4 MOS.

## 2015-06-08 NOTE — Assessment & Plan Note (Signed)
RECTAL PAIN IMPROVED WITH MEDICAL MANAGEMENT.  CONTINUE TO MONITOR SYMPTOMS. CONSIDER CHR OR FLEX/SIG IH BANDING. FOLLOW UP IN 4 MOS.

## 2015-06-08 NOTE — Progress Notes (Signed)
CC'ED TO PCP 

## 2015-06-08 NOTE — Progress Notes (Signed)
Subjective:    Patient ID: Krystal [name], female    DOB: 1936-10-02, 79 y.o.   MRN: [id number]   Krystal Font, MD  HPI GETTING READY TO MOVE. TREE FELL IN THE YARD. HEMORRHOID DOING GOOD. IT'S DOWN A LITTLE. NOT ITCHING. BMs: HALF HARD/NL. MOVING QOD. NOT USING MIRALAX  REALLY. ONLY WHEN SHE FEELS CONSTIPATED.SOB: NOT GOOD. OXYGEN IN THE CAR. OCCASIONAL BLOATING. MAY EAT ICE CREAM OR DRINK ICE CREAM. MAY HAVE VERTIGO.  PT DENIES FEVER, CHILLS, HEMATOCHEZIA, nausea, vomiting, melena, diarrhea, CHEST PAIN, SHORTNESS OF BREATH,  constipation, abdominal pain, problems swallowing, OR heartburn or indigestion.  Past Medical History  Diagnosis Date  . Sleep apnea     CPAP  . Myalgia   . Palpitations   . Microcytic anemia     History of occult blood in stool  . DJD (degenerative joint disease)   . CAD (coronary artery disease) 2009    5 stents- #3 in RCA, #1 each in LAD and AVG  . Osteoarthritis   . Essential hypertension   . Hyperlipidemia   . GERD (gastroesophageal reflux disease)   . Allergic rhinitis   . Personal history of colonic polyps   . HOH (hard of hearing)   . MI (myocardial infarction) 1998  . Glaucoma   . Diastolic dysfunction, left ventricle   . CKD (chronic kidney disease) stage 3, GFR 30-59 ml/min   . Emphysema lung     2L N/C continuously  . Complete heart block, transient 2014  . Gout   . Type II diabetes mellitus with nephropathy   . History of uterine cancer    Past Surgical History  Procedure Laterality Date  . Ectopic pregnancy surgery    . Foot surgery      Left and right for callous  . Cervical discectomy      L5 left/hemilminectomy  . Colonoscopy  09/2006    Int hemmorhoids, COMPLICATED BY CARDIOPULMONARY COMPLICATIONS  . Abdominal hysterectomy  04/2010    Uterine cancer,TAHBSO  . Polypectomy  10/22/2011    Internal hemorrhoids/sessile polyp  . Coronary angioplasty  1/99, 1/07, 1/08, 4/09    5 cardiac stents total  . Left and right heart  catheterization with coronary angiogram N/A 09/12/2012    Procedure: LEFT AND RIGHT HEART CATHETERIZATION WITH CORONARY ANGIOGRAM;  Surgeon: Sanda Klein, MD;  Location: [facility] CATH LAB;  Service: Cardiovascular;  Laterality: N/A;   Allergies  Allergen Reactions  . Morphine Shortness Of Breath and Swelling  . Penicillins Shortness Of Breath and Swelling   Current Outpatient Prescriptions  Medication Sig Dispense Refill  . aspirin 81 MG EC tablet Take 81 mg by mouth every morning.     . budesonide-formoterol (SYMBICORT) 160-4.5 MCG/ACT inhaler Inhale 2 puffs into the lungs 2 (two) times daily as needed (for shortness of breath).    . calcitRIOL (ROCALTROL) 0.25 MCG capsule Take 0.25 mcg by mouth daily. Frequency change. Now taking everyday.    . clobetasol ointment (TEMOVATE) 7.85 % Apply 1 application topically 2 (two) times daily.    . clopidogrel (PLAVIX) 75 MG tablet TAKE 1 TABLET ONCE DAILY.    Marland Kitchen ezetimibe (ZETIA) 10 MG tablet Take 10 mg by mouth every morning.     . famotidine (PEPCID) 20 MG tablet TAKE (1) TABLET TWICE DAILY.    . ferrous sulfate 325 (65 FE) MG tablet Take 325 mg by mouth daily.     . Fluticasone-Salmeterol (ADVAIR DISKUS) 250-50 MCG/DOSE AEPB Inhale 1 puff into the  lungs 2 (two) times daily.     Marland Kitchen HYDROcodone-acetaminophen (VICODIN) 5-500 MG per tablet Take 1 tablet by mouth 2 (two) times daily as needed for pain.     Marland Kitchen insulin glargine (LANTUS) 100 UNIT/ML injection Inject 40 Units into the skin 2 (two) times daily.     . isosorbide mononitrate (IMDUR) 60 MG 24 hr tablet TAKE 1&1/2 TABLETS IN THE MORNING AND 1 TABLET THE EVENING.    Marland Kitchen levocetirizine (XYZAL) 5 MG tablet Take 5 mg by mouth every evening.    . Liraglutide (VICTOZA) 18 MG/3ML SOPN Inject 1.8 mLs into the skin daily. As directed    . lisinopril (PRINIVIL,ZESTRIL) 10 MG tablet Take 1 tablet (10 mg total) by mouth daily.    . montelukast (SINGULAIR) 10 MG tablet Take 10 mg by mouth at bedtime.    .      .       . nystatin-triamcinolone ointment (MYCOLOG) Apply 1 application topically 2 (two) times daily. To affected area.    Donell Sievert IN Inhale into the lungs. 2 liters continuous    . POLYETHYLENE GLYCOL 3350-GRX PO Take by mouth as needed.    . rosuvastatin (CRESTOR) 20 MG tablet Take 20 mg by mouth every evening.    . tiotropium (SPIRIVA) 18 MCG inhalation capsule Place 18 mcg into inhaler and inhale daily.    .        Review of Systems     Objective:   Physical Exam  Constitutional: She is oriented to person, place, and time. She appears well-developed and well-nourished. No distress.  HENT:  Head: Normocephalic and atraumatic.  Mouth/Throat: Oropharynx is clear and moist. No oropharyngeal exudate.  Eyes: Pupils are equal, round, and reactive to light. No scleral icterus.  Neck: Normal range of motion. Neck supple.  Cardiovascular: Normal rate, regular rhythm and normal heart sounds.   Pulmonary/Chest: Effort normal and breath sounds normal. No respiratory distress.  Abdominal: Soft. Bowel sounds are normal. She exhibits no distension. There is no tenderness.  Musculoskeletal: She exhibits no edema.  Lymphadenopathy:    She has no cervical adenopathy.  Neurological: She is alert and oriented to person, place, and time.  NO  NEW FOCAL DEFICITS   Psychiatric: She has a normal mood and affect.  Vitals reviewed.         Assessment & Plan:

## 2015-06-08 NOTE — Assessment & Plan Note (Signed)
SYMPTOMS CONTROLLED/RESOLVED.  CONTINUE TO MONITOR SYMPTOMS. 

## 2015-06-08 NOTE — Progress Notes (Signed)
PATIENT ON RECALL LIST  °

## 2015-06-08 NOTE — Assessment & Plan Note (Signed)
SYMPTOMS FAIRLY WELL CONTROLLED.  MIRLALX PRN FOLLOW UP IN 4 MOS.

## 2015-06-09 DIAGNOSIS — I5032 Chronic diastolic (congestive) heart failure: Secondary | ICD-10-CM | POA: Diagnosis not present

## 2015-06-09 DIAGNOSIS — N183 Chronic kidney disease, stage 3 (moderate): Secondary | ICD-10-CM | POA: Diagnosis not present

## 2015-06-09 DIAGNOSIS — E119 Type 2 diabetes mellitus without complications: Secondary | ICD-10-CM | POA: Diagnosis not present

## 2015-06-09 DIAGNOSIS — J449 Chronic obstructive pulmonary disease, unspecified: Secondary | ICD-10-CM | POA: Diagnosis not present

## 2015-06-09 DIAGNOSIS — I129 Hypertensive chronic kidney disease with stage 1 through stage 4 chronic kidney disease, or unspecified chronic kidney disease: Secondary | ICD-10-CM | POA: Diagnosis not present

## 2015-06-09 DIAGNOSIS — I2510 Atherosclerotic heart disease of native coronary artery without angina pectoris: Secondary | ICD-10-CM | POA: Diagnosis not present

## 2015-06-17 DIAGNOSIS — I5032 Chronic diastolic (congestive) heart failure: Secondary | ICD-10-CM | POA: Diagnosis not present

## 2015-06-17 DIAGNOSIS — N183 Chronic kidney disease, stage 3 (moderate): Secondary | ICD-10-CM | POA: Diagnosis not present

## 2015-06-17 DIAGNOSIS — E119 Type 2 diabetes mellitus without complications: Secondary | ICD-10-CM | POA: Diagnosis not present

## 2015-06-17 DIAGNOSIS — J449 Chronic obstructive pulmonary disease, unspecified: Secondary | ICD-10-CM | POA: Diagnosis not present

## 2015-06-17 DIAGNOSIS — I129 Hypertensive chronic kidney disease with stage 1 through stage 4 chronic kidney disease, or unspecified chronic kidney disease: Secondary | ICD-10-CM | POA: Diagnosis not present

## 2015-06-17 DIAGNOSIS — I2510 Atherosclerotic heart disease of native coronary artery without angina pectoris: Secondary | ICD-10-CM | POA: Diagnosis not present

## 2015-06-21 DIAGNOSIS — L603 Nail dystrophy: Secondary | ICD-10-CM | POA: Diagnosis not present

## 2015-06-21 DIAGNOSIS — I739 Peripheral vascular disease, unspecified: Secondary | ICD-10-CM | POA: Diagnosis not present

## 2015-06-21 DIAGNOSIS — D485 Neoplasm of uncertain behavior of skin: Secondary | ICD-10-CM | POA: Diagnosis not present

## 2015-06-21 DIAGNOSIS — L84 Corns and callosities: Secondary | ICD-10-CM | POA: Diagnosis not present

## 2015-06-21 DIAGNOSIS — M10071 Idiopathic gout, right ankle and foot: Secondary | ICD-10-CM | POA: Diagnosis not present

## 2015-06-21 DIAGNOSIS — M10072 Idiopathic gout, left ankle and foot: Secondary | ICD-10-CM | POA: Diagnosis not present

## 2015-06-21 DIAGNOSIS — E1051 Type 1 diabetes mellitus with diabetic peripheral angiopathy without gangrene: Secondary | ICD-10-CM | POA: Diagnosis not present

## 2015-06-22 NOTE — Telephone Encounter (Signed)
Error

## 2015-06-24 ENCOUNTER — Encounter: Payer: Medicare Other | Attending: Family Medicine | Admitting: Nutrition

## 2015-06-24 ENCOUNTER — Other Ambulatory Visit: Payer: Self-pay | Admitting: Cardiovascular Disease

## 2015-06-24 VITALS — Ht 67.0 in | Wt 201.0 lb

## 2015-06-24 DIAGNOSIS — Z794 Long term (current) use of insulin: Secondary | ICD-10-CM | POA: Insufficient documentation

## 2015-06-24 DIAGNOSIS — IMO0002 Reserved for concepts with insufficient information to code with codable children: Secondary | ICD-10-CM

## 2015-06-24 DIAGNOSIS — Z6830 Body mass index (BMI) 30.0-30.9, adult: Secondary | ICD-10-CM | POA: Diagnosis not present

## 2015-06-24 DIAGNOSIS — E669 Obesity, unspecified: Secondary | ICD-10-CM | POA: Diagnosis not present

## 2015-06-24 DIAGNOSIS — Z713 Dietary counseling and surveillance: Secondary | ICD-10-CM | POA: Diagnosis not present

## 2015-06-24 DIAGNOSIS — E118 Type 2 diabetes mellitus with unspecified complications: Secondary | ICD-10-CM | POA: Diagnosis not present

## 2015-06-24 DIAGNOSIS — E1165 Type 2 diabetes mellitus with hyperglycemia: Secondary | ICD-10-CM

## 2015-06-24 NOTE — Patient Instructions (Signed)
  Goals:  1. Cut out Bacon,egg and chese biscuits and choose cereal or eggs and toast/fruit instead. 2. Increase fresh fruits and vegetables. 3. Cut out sweets, cakes, cookies and junk food. 4. Avoid snacks between meals 5. Increase water intake. 6. Get A1C down to 8% in three months. 7. Avoid sweets due to COPD and breathing issues.

## 2015-06-24 NOTE — Progress Notes (Signed)
  Medical Nutrition Therapy:  Appt start time: 1200 end time:  1230.   Assessment:  Primary concerns today: Diabetes. Lives by herself. On continuous oxygen. Has lost 6 lbs since last visit with Dr. Dorris Fetch.  Eats out a lot and doesn't cook much. Most foods eaten are baked and fried. Most recent A1C was 11%. IN the process of moving and is stressed from the move. Limited mobility due to COPD and being on oxygen. Brought her meter and BS log. FBS 144-219 mg/dl. Before lunch 151-254 mg/dl Before dinner 290-320's mg/dl.  7 day avg 205, 14 day avg 206 and 30 day avg 200. She admits to eating sweets/snack/desserts and eating late at night. Takes insulin as prescribed. Insulin 30 units HS and 5 units plus sliding scale with meals. [place] 1.8 gm/dl. Diet is high in fat, sodium and carbs and not enough fresh fruits and vegetables. She admits to eating a lot of bacon, egg and cheese biscuits recently for breakfast.   Preferred Learning Style:   Auditory  Visual  Hands on  No preference indicated    Learning Readiness:   Ready  Change in progress  MEDICATIONS: See list   DIETARY INTAKE:  24-hr recall:  B ( AM): bacon egg and cheese biscuit, water  Snk ( AM): fruit or crackers L ( PM): eats out Snk ( PM): chips or cookies D ( PM): Eats out Snk ( PM): popcorn or whatever Beverages: water  Usual physical activity: limited due to COPD and on oxygen.  Estimated energy needs: 1500 -1800 calories 170 g carbohydrates 112 g protein 42 g fat  Progress Towards Goal(s):  In progress.   Nutritional Diagnosis:  NB-1.1 Food and nutrition-related knowledge deficit As related to DIabetes .  As evidenced by A1C > 11%..    Intervention:  Nutrition counseling on My Plate, portion sizes, meal planning, CHO counting, target goals for blood sugars, signs/symptoms of hyper/hypoglycemia and treatment of both and prevention of complications of diabetes with a low fat low sodium high fiber  diet..  Goals:  1. Cut out Bacon,egg and chese biscuits and choose cereal or eggs and toast/fruit instead. 2. Increase fresh fruits and vegetables. 3. Cut out sweets, cakes, cookies and junk food. 4. Avoid snacks between meals 5. Increase water intake. 6. Get A1C down to 8% in three months. 7. Avoid sweets due to COPD and breathing issues.  Teaching Method Utilized:  Visual Auditory Hands on  Handouts given during visit include:  The Plate Method  Meal Plan Card  Diabetes instructions  Barriers to learning/adherence to lifestyle change: Breathing and mobility  Demonstrated degree of understanding via:  Teach Back   Monitoring/Evaluation:  Dietary intake, exercise, meal planning, SBG, and body weight in 1 month(s).

## 2015-07-01 DIAGNOSIS — E1122 Type 2 diabetes mellitus with diabetic chronic kidney disease: Secondary | ICD-10-CM | POA: Diagnosis not present

## 2015-07-01 DIAGNOSIS — I10 Essential (primary) hypertension: Secondary | ICD-10-CM | POA: Diagnosis not present

## 2015-07-01 DIAGNOSIS — E782 Mixed hyperlipidemia: Secondary | ICD-10-CM | POA: Diagnosis not present

## 2015-07-01 DIAGNOSIS — E042 Nontoxic multinodular goiter: Secondary | ICD-10-CM | POA: Diagnosis not present

## 2015-07-04 ENCOUNTER — Ambulatory Visit (INDEPENDENT_AMBULATORY_CARE_PROVIDER_SITE_OTHER): Payer: Medicare Other | Admitting: Cardiovascular Disease

## 2015-07-04 ENCOUNTER — Encounter: Payer: Self-pay | Admitting: Cardiovascular Disease

## 2015-07-04 VITALS — BP 160/100 | HR 93 | Ht 67.0 in | Wt 197.6 lb

## 2015-07-04 DIAGNOSIS — I5032 Chronic diastolic (congestive) heart failure: Secondary | ICD-10-CM

## 2015-07-04 DIAGNOSIS — I2510 Atherosclerotic heart disease of native coronary artery without angina pectoris: Secondary | ICD-10-CM

## 2015-07-04 DIAGNOSIS — R42 Dizziness and giddiness: Secondary | ICD-10-CM | POA: Diagnosis not present

## 2015-07-04 DIAGNOSIS — J438 Other emphysema: Secondary | ICD-10-CM

## 2015-07-04 DIAGNOSIS — R079 Chest pain, unspecified: Secondary | ICD-10-CM | POA: Diagnosis not present

## 2015-07-04 DIAGNOSIS — I25709 Atherosclerosis of coronary artery bypass graft(s), unspecified, with unspecified angina pectoris: Secondary | ICD-10-CM

## 2015-07-04 DIAGNOSIS — N189 Chronic kidney disease, unspecified: Secondary | ICD-10-CM

## 2015-07-04 DIAGNOSIS — E785 Hyperlipidemia, unspecified: Secondary | ICD-10-CM

## 2015-07-04 DIAGNOSIS — I10 Essential (primary) hypertension: Secondary | ICD-10-CM | POA: Diagnosis not present

## 2015-07-04 MED ORDER — RANOLAZINE ER 500 MG PO TB12: 500.0000 mg | ORAL_TABLET | Freq: Two times a day (BID) | ORAL | Status: DC

## 2015-07-04 NOTE — Progress Notes (Signed)
Patient ID: Krystal [name], female   DOB: 02/06/1936, 79 y.o.   MRN: [id number]      SUBJECTIVE: The patient presents for routine cardiovascular followup. In summary, she has a history of chronic diastolic heart failure, hypertension, coronary artery disease with multiple previously placed stents, COPD and is on chronic oxygen at home (3L), sleep apnea and uses CPAP, hyperlipidemia, and insulin-dependent diabetes mellitus.   Echocardiogram on 04/15/2015 demonstrated vigorous left ventricular systolic function, EF 42-70%, severe LVH, grade 1 diastolic dysfunction, elevated filling pressures, trivial mitral regurgitation, trivial tricuspid regurgitation, and normal pulmonary pressures of 27 mmHg.   As per Dr. Evette Georges note on 02/14/14, she has coronary artery disease dating back to the 1990s and has undergone multiple stent procedures involving the proximal LAD, circumflex, mid right coronary artery, and distal right coronary artery. Her last catheterization in October 2013 did not reveal high-grade restenosis. She did have 80-90% ostial stenosis and a small jailed first septal perforating artery. She had mild calcification and 20-30% narrowing in the stent in the circumflex vessel. The right coronary artery had 3 stents and there was 40-50% in-stent smooth narrowing with 30% narrowing in the distal stent.   She was hospitalized earlier this year for dizziness and near syncope likely secondary to hypovolemia as she was taking more diabetics than prescribed. She has mild orthostatic hypotension and chronic dizziness as well.  She says BP was "mildly elevated" at another provider's office last week but does not remember the value. She has been having chest pain primarily when lying down at night. She denies significant acid reflux disease and denies dysphagia for solids or liquids.  She has been undergoing a lot of stress as she has no help and is trying to move. Says she feels better being off of  amlodipine.   Review of Systems: As per "subjective", otherwise negative.  Allergies  Allergen Reactions  . Morphine Shortness Of Breath and Swelling  . Penicillins Shortness Of Breath and Swelling  . Shellfish Allergy     Current Outpatient Prescriptions  Medication Sig Dispense Refill  . aspirin 81 MG EC tablet Take 81 mg by mouth every morning.     . budesonide-formoterol (SYMBICORT) 160-4.5 MCG/ACT inhaler Inhale 2 puffs into the lungs 2 (two) times daily as needed (for shortness of breath).    . calcitRIOL (ROCALTROL) 0.25 MCG capsule Take 0.25 mcg by mouth daily. Frequency change. Now taking everyday.    . clobetasol ointment (TEMOVATE) 6.23 % Apply 1 application topically 2 (two) times daily.    . clopidogrel (PLAVIX) 75 MG tablet TAKE 1 TABLET ONCE DAILY. 30 tablet 0  . ezetimibe (ZETIA) 10 MG tablet Take 10 mg by mouth every morning.     . famotidine (PEPCID) 20 MG tablet TAKE (1) TABLET TWICE DAILY. 60 tablet 3  . ferrous sulfate 325 (65 FE) MG tablet Take 325 mg by mouth daily.     . Fluticasone-Salmeterol (ADVAIR DISKUS) 250-50 MCG/DOSE AEPB Inhale 1 puff into the lungs 2 (two) times daily.     Marland Kitchen HYDROcodone-acetaminophen (VICODIN) 5-500 MG per tablet Take 1 tablet by mouth 2 (two) times daily as needed for pain.     Marland Kitchen insulin glargine (LANTUS) 100 UNIT/ML injection Inject 40 Units into the skin 2 (two) times daily.     . isosorbide mononitrate (IMDUR) 60 MG 24 hr tablet TAKE 1&1/2 TABLETS IN THE MORNING AND 1 TABLET THE EVENING. 75 tablet 3  . levocetirizine (XYZAL) 5 MG tablet Take 5 mg  by mouth every evening.    . Liraglutide (VICTOZA) 18 MG/3ML SOPN Inject 1.8 mLs into the skin daily. As directed    . lisinopril (PRINIVIL,ZESTRIL) 10 MG tablet TAKE (1) TABLET BY MOUTH ONCE DAILY. 30 tablet 0  . montelukast (SINGULAIR) 10 MG tablet Take 10 mg by mouth at bedtime.    . nebivolol (BYSTOLIC) 5 MG tablet Take 1 tab 2 times daily 60 tablet 3  . NITROSTAT 0.4 MG SL tablet  PLACE ONE (1) TABLET UNDER TONGUE EVERY 5 MINUTES UP TO (3) DOSES AS NEEDED FOR CHEST PAIN. (Patient taking differently: PLACE ONE (1) TABLET UNDER TONGUE EVERY 5 MINUTES UP TO (3) DOSES AS NEEDED FOR CHEST  used 2 weeks ago) 25 tablet 0  . NOVOLOG FLEXPEN 100 UNIT/ML FlexPen USE 25 UNITS AT BREAKFAST, 20 UNITS AT LUNCH AND 35 UNITS AT SUPPER SUB-Q. (Patient taking differently: 40 units in the morning) 30 mL 2  . nystatin-triamcinolone ointment (MYCOLOG) Apply 1 application topically 2 (two) times daily. To affected area. 30 g prn  . OXYGEN-HELIUM IN Inhale into the lungs. 2 liters continuous    . POLYETHYLENE GLYCOL 3350-GRX PO Take by mouth as needed.    . rosuvastatin (CRESTOR) 20 MG tablet Take 20 mg by mouth every evening.    . tiotropium (SPIRIVA) 18 MCG inhalation capsule Place 18 mcg into inhaler and inhale daily.     No current facility-administered medications for this visit.    Past Medical History  Diagnosis Date  . Sleep apnea     CPAP  . Myalgia   . Palpitations   . Microcytic anemia     History of occult blood in stool  . DJD (degenerative joint disease)   . CAD (coronary artery disease) 2009    5 stents- #3 in RCA, #1 each in LAD and AVG  . Osteoarthritis   . Essential hypertension   . Hyperlipidemia   . GERD (gastroesophageal reflux disease)   . Allergic rhinitis   . Personal history of colonic polyps   . HOH (hard of hearing)   . MI (myocardial infarction) 1998  . Glaucoma   . Diastolic dysfunction, left ventricle   . CKD (chronic kidney disease) stage 3, GFR 30-59 ml/min   . Emphysema lung     2L N/C continuously  . Complete heart block, transient 2014  . Gout   . Type II diabetes mellitus with nephropathy   . History of uterine cancer     Past Surgical History  Procedure Laterality Date  . Ectopic pregnancy surgery    . Foot surgery      Left and right for callous  . Cervical discectomy      L5 left/hemilminectomy  . Colonoscopy  09/2006    Int  hemmorhoids, COMPLICATED BY CARDIOPULMONARY COMPLICATIONS  . Abdominal hysterectomy  04/2010    Uterine cancer,TAHBSO  . Polypectomy  10/22/2011    Internal hemorrhoids/sessile polyp  . Coronary angioplasty  1/99, 1/07, 1/08, 4/09    5 cardiac stents total  . Left and right heart catheterization with coronary angiogram N/A 09/12/2012    Procedure: LEFT AND RIGHT HEART CATHETERIZATION WITH CORONARY ANGIOGRAM;  Surgeon: Sanda Klein, MD;  Location: [facility] CATH LAB;  Service: Cardiovascular;  Laterality: N/A;    Social History   Social History  . Marital Status: Widowed    Spouse Name: N/A  . Number of Children: 0  . Years of Education: 12   Occupational History  .  retired   Social History Main Topics  . Smoking status: Former Smoker -- 0.40 packs/day for 51 years    Types: Cigarettes    Start date: 04/22/1955    Quit date: 10/18/2006  . Smokeless tobacco: Never Used     Comment: quit about 4 yrs ago  . Alcohol Use: No  . Drug Use: No  . Sexual Activity: No   Other Topics Concern  . Not on file   Social History Narrative   Consumes 2 cups of caffeine daily     Filed Vitals:   07/04/15 1416  BP: 160/100  Pulse: 93  Height: 5\' 7"  (1.702 m)  Weight: 197 lb 9.6 oz (89.631 kg)  SpO2: 96%    PHYSICAL EXAM General: NAD, using oxygen by nasal cannula. Neck: No JVD, no thyromegaly.  Lungs: Diminished but clear b/l. No rales or wheezes appreciated.  CV: Nondisplaced PMI. Regular rate and rhythm, normal S1/S2, no S3/S4, no murmur. No pretibial or periankle edema.  Abdomen: Soft, nontender, no hepatosplenomegaly, no distention.  Neurologic: Alert and oriented x 3.  Psych: Mildly flat affect.  Skin: Normal. Musculoskeletal: Normal range of motion, no gross deformities. Extremities: No clubbing or cyanosis.   ECG: Most recent ECG reviewed.      ASSESSMENT AND PLAN: 1. Chronic diastolic heart failure: She is currently euvolemic and stable. No longer on  diuretics. Denies worsening SOB or leg swelling.  2. Chest pain with CAD: Stable ischemic heart disease. Has CP which is non-exertional and primarily while lying down. Denies acid reflux symptoms. She underwent a low risk, normal nuclear stress test in September 2014. Continue current therapy with aspirin, Plavix, Zetia, Bystolic, and Crestor. She may  Have developed nitrate tolerance. Will d/c Imdur and start Ranexa 500 mg bid.  3. Essential hypertension: Markedly elevated. Amlodipine d/c in June by Gerrianne Scale PA-C and patient feels better being off of it. Patient attributes today's reading to stress. Will monitor.  4. Hyperlipidemia: Continue current therapy with Crestor and Zetia.   5. CKD: BUN 34, creat 1.71 on 05/04/15.  6. COPD: Chronic dyspnea with exertion appears to be stable on 3L oxygen.  7. Obstructive sleep apnea: I previously referred her for a CPAP titration study given her sleep dysfunction.  Dispo: f/u 3-4 months with Gerrianne Scale PA-C.   Kate Sable, M.D., F.A.C.C.

## 2015-07-04 NOTE — Patient Instructions (Addendum)
Your physician recommends that you schedule a follow-up appointment in: 3 [place]  STOP IMDUR  START RANEXA 500 MG TWO TIMES DAILY  Thanks for choosing [facility]!!!

## 2015-07-15 ENCOUNTER — Encounter (HOSPITAL_COMMUNITY): Payer: Self-pay | Admitting: Emergency Medicine

## 2015-07-15 ENCOUNTER — Emergency Department (HOSPITAL_COMMUNITY)
Admission: EM | Admit: 2015-07-15 | Discharge: 2015-07-16 | Disposition: A | Discharge status: Home / Self Care | Payer: Medicare Other | Attending: Emergency Medicine | Admitting: Emergency Medicine

## 2015-07-15 DIAGNOSIS — Y92410 Unspecified street and highway as the place of occurrence of the external cause: Secondary | ICD-10-CM | POA: Insufficient documentation

## 2015-07-15 DIAGNOSIS — G4730 Sleep apnea, unspecified: Secondary | ICD-10-CM | POA: Insufficient documentation

## 2015-07-15 DIAGNOSIS — Z7952 Long term (current) use of systemic steroids: Secondary | ICD-10-CM | POA: Diagnosis not present

## 2015-07-15 DIAGNOSIS — H409 Unspecified glaucoma: Secondary | ICD-10-CM | POA: Insufficient documentation

## 2015-07-15 DIAGNOSIS — Z79899 Other long term (current) drug therapy: Secondary | ICD-10-CM | POA: Diagnosis not present

## 2015-07-15 DIAGNOSIS — Z794 Long term (current) use of insulin: Secondary | ICD-10-CM | POA: Insufficient documentation

## 2015-07-15 DIAGNOSIS — Z880 Allergy status to penicillin: Secondary | ICD-10-CM | POA: Insufficient documentation

## 2015-07-15 DIAGNOSIS — Z7902 Long term (current) use of antithrombotics/antiplatelets: Secondary | ICD-10-CM | POA: Diagnosis not present

## 2015-07-15 DIAGNOSIS — Z9889 Other specified postprocedural states: Secondary | ICD-10-CM | POA: Diagnosis not present

## 2015-07-15 DIAGNOSIS — S0990XA Unspecified injury of head, initial encounter: Secondary | ICD-10-CM | POA: Diagnosis not present

## 2015-07-15 DIAGNOSIS — Z86010 Personal history of colonic polyps: Secondary | ICD-10-CM | POA: Insufficient documentation

## 2015-07-15 DIAGNOSIS — I2510 Atherosclerotic heart disease of native coronary artery without angina pectoris: Secondary | ICD-10-CM | POA: Diagnosis not present

## 2015-07-15 DIAGNOSIS — S29092A Other injury of muscle and tendon of back wall of thorax, initial encounter: Secondary | ICD-10-CM | POA: Insufficient documentation

## 2015-07-15 DIAGNOSIS — D509 Iron deficiency anemia, unspecified: Secondary | ICD-10-CM | POA: Insufficient documentation

## 2015-07-15 DIAGNOSIS — I129 Hypertensive chronic kidney disease with stage 1 through stage 4 chronic kidney disease, or unspecified chronic kidney disease: Secondary | ICD-10-CM | POA: Insufficient documentation

## 2015-07-15 DIAGNOSIS — S199XXA Unspecified injury of neck, initial encounter: Secondary | ICD-10-CM | POA: Insufficient documentation

## 2015-07-15 DIAGNOSIS — Y998 Other external cause status: Secondary | ICD-10-CM | POA: Diagnosis not present

## 2015-07-15 DIAGNOSIS — Y9389 Activity, other specified: Secondary | ICD-10-CM | POA: Diagnosis not present

## 2015-07-15 DIAGNOSIS — E1121 Type 2 diabetes mellitus with diabetic nephropathy: Secondary | ICD-10-CM | POA: Diagnosis not present

## 2015-07-15 DIAGNOSIS — E785 Hyperlipidemia, unspecified: Secondary | ICD-10-CM | POA: Diagnosis not present

## 2015-07-15 DIAGNOSIS — H9190 Unspecified hearing loss, unspecified ear: Secondary | ICD-10-CM | POA: Diagnosis not present

## 2015-07-15 DIAGNOSIS — Z8541 Personal history of malignant neoplasm of cervix uteri: Secondary | ICD-10-CM | POA: Insufficient documentation

## 2015-07-15 DIAGNOSIS — Z7951 Long term (current) use of inhaled steroids: Secondary | ICD-10-CM | POA: Diagnosis not present

## 2015-07-15 DIAGNOSIS — Z7982 Long term (current) use of aspirin: Secondary | ICD-10-CM | POA: Insufficient documentation

## 2015-07-15 DIAGNOSIS — R51 Headache: Secondary | ICD-10-CM | POA: Diagnosis not present

## 2015-07-15 DIAGNOSIS — J439 Emphysema, unspecified: Secondary | ICD-10-CM | POA: Insufficient documentation

## 2015-07-15 DIAGNOSIS — N183 Chronic kidney disease, stage 3 (moderate): Secondary | ICD-10-CM | POA: Insufficient documentation

## 2015-07-15 DIAGNOSIS — R519 Headache, unspecified: Secondary | ICD-10-CM

## 2015-07-15 DIAGNOSIS — I252 Old myocardial infarction: Secondary | ICD-10-CM | POA: Diagnosis not present

## 2015-07-15 DIAGNOSIS — M1990 Unspecified osteoarthritis, unspecified site: Secondary | ICD-10-CM | POA: Insufficient documentation

## 2015-07-15 DIAGNOSIS — M542 Cervicalgia: Secondary | ICD-10-CM

## 2015-07-15 MED ORDER — ACETAMINOPHEN 325 MG PO TABS
650.0000 mg | ORAL_TABLET | Freq: Once | ORAL | Status: AC
  Administered 2015-07-15: 650 mg via ORAL
  Filled 2015-07-15: qty 2
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
## 2015-07-16 ENCOUNTER — Emergency Department (HOSPITAL_COMMUNITY): Payer: Medicare Other

## 2015-07-16 DIAGNOSIS — R51 Headache: Secondary | ICD-10-CM | POA: Diagnosis not present

## 2015-07-16 DIAGNOSIS — M542 Cervicalgia: Secondary | ICD-10-CM | POA: Diagnosis not present

## 2015-07-16 DIAGNOSIS — S0990XA Unspecified injury of head, initial encounter: Secondary | ICD-10-CM | POA: Diagnosis not present

## 2015-07-16 DIAGNOSIS — S199XXA Unspecified injury of neck, initial encounter: Secondary | ICD-10-CM | POA: Diagnosis not present

## 2015-07-16 MED ORDER — METHOCARBAMOL 500 MG PO TABS: 500.0000 mg | ORAL_TABLET | Freq: Two times a day (BID) | ORAL | Status: DC
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
## 2015-07-16 NOTE — ED Notes (Signed)
PT states understanding of care given and follow up instructions.  States she is able to drive self home.  Ambulated out of ED

## 2015-07-26 ENCOUNTER — Other Ambulatory Visit: Payer: Self-pay | Admitting: Adult Health

## 2015-08-02 DIAGNOSIS — M542 Cervicalgia: Secondary | ICD-10-CM | POA: Diagnosis not present

## 2015-08-02 DIAGNOSIS — M25519 Pain in unspecified shoulder: Secondary | ICD-10-CM | POA: Diagnosis not present

## 2015-08-02 DIAGNOSIS — E1122 Type 2 diabetes mellitus with diabetic chronic kidney disease: Secondary | ICD-10-CM | POA: Diagnosis not present

## 2015-08-03 ENCOUNTER — Telehealth: Payer: Self-pay
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
## 2015-08-05 ENCOUNTER — Other Ambulatory Visit: Payer: Self-pay | Admitting: Cardiovascular Disease
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
## 2015-08-10 ENCOUNTER — Encounter: Payer: Medicare Other | Attending: Family Medicine | Admitting: Nutrition

## 2015-08-10 VITALS — Ht 67.0 in | Wt 196.0 lb

## 2015-08-10 DIAGNOSIS — Z6830 Body mass index (BMI) 30.0-30.9, adult: Secondary | ICD-10-CM | POA: Diagnosis not present

## 2015-08-10 DIAGNOSIS — E118 Type 2 diabetes mellitus with unspecified complications: Secondary | ICD-10-CM | POA: Insufficient documentation

## 2015-08-10 DIAGNOSIS — Z794 Long term (current) use of insulin: Secondary | ICD-10-CM | POA: Diagnosis not present

## 2015-08-10 DIAGNOSIS — Z713 Dietary counseling and surveillance: Secondary | ICD-10-CM | POA: Diagnosis not present

## 2015-08-10 DIAGNOSIS — E1165 Type 2 diabetes mellitus with hyperglycemia: Secondary | ICD-10-CM

## 2015-08-10 DIAGNOSIS — E669 Obesity, unspecified: Secondary | ICD-10-CM | POA: Diagnosis not present
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
## 2015-08-11 ENCOUNTER — Telehealth: Payer: Self-pay | Admitting: Nutrition

## 2015-08-11 ENCOUNTER — Emergency Department (HOSPITAL_COMMUNITY): Payer: Medicare Other

## 2015-08-11 ENCOUNTER — Encounter (HOSPITAL_COMMUNITY): Payer: Self-pay | Admitting: *Deleted

## 2015-08-11 ENCOUNTER — Emergency Department (HOSPITAL_COMMUNITY)
Admission: EM | Admit: 2015-08-11 | Discharge: 2015-08-11 | Disposition: A | Discharge status: Home / Self Care | Payer: Medicare Other | Attending: Emergency Medicine | Admitting: Emergency Medicine

## 2015-08-11 DIAGNOSIS — Z7951 Long term (current) use of inhaled steroids: Secondary | ICD-10-CM | POA: Diagnosis not present

## 2015-08-11 DIAGNOSIS — E119 Type 2 diabetes mellitus without complications: Secondary | ICD-10-CM | POA: Insufficient documentation

## 2015-08-11 DIAGNOSIS — Z8542 Personal history of malignant neoplasm of other parts of uterus: Secondary | ICD-10-CM | POA: Diagnosis not present

## 2015-08-11 DIAGNOSIS — I252 Old myocardial infarction: Secondary | ICD-10-CM | POA: Diagnosis not present

## 2015-08-11 DIAGNOSIS — R42 Dizziness and giddiness: Secondary | ICD-10-CM | POA: Diagnosis not present

## 2015-08-11 DIAGNOSIS — H409 Unspecified glaucoma: Secondary | ICD-10-CM | POA: Insufficient documentation

## 2015-08-11 DIAGNOSIS — H9190 Unspecified hearing loss, unspecified ear: Secondary | ICD-10-CM | POA: Diagnosis not present

## 2015-08-11 DIAGNOSIS — Z7952 Long term (current) use of systemic steroids: Secondary | ICD-10-CM | POA: Insufficient documentation

## 2015-08-11 DIAGNOSIS — Z7982 Long term (current) use of aspirin: Secondary | ICD-10-CM | POA: Insufficient documentation

## 2015-08-11 DIAGNOSIS — Z880 Allergy status to penicillin: Secondary | ICD-10-CM | POA: Insufficient documentation

## 2015-08-11 DIAGNOSIS — I129 Hypertensive chronic kidney disease with stage 1 through stage 4 chronic kidney disease, or unspecified chronic kidney disease: Secondary | ICD-10-CM | POA: Insufficient documentation

## 2015-08-11 DIAGNOSIS — I2510 Atherosclerotic heart disease of native coronary artery without angina pectoris: Secondary | ICD-10-CM | POA: Diagnosis not present

## 2015-08-11 DIAGNOSIS — Z794 Long term (current) use of insulin: Secondary | ICD-10-CM | POA: Insufficient documentation

## 2015-08-11 DIAGNOSIS — M4807 Spinal stenosis, lumbosacral region: Secondary | ICD-10-CM | POA: Diagnosis not present

## 2015-08-11 DIAGNOSIS — M542 Cervicalgia: Secondary | ICD-10-CM | POA: Diagnosis not present

## 2015-08-11 DIAGNOSIS — E785 Hyperlipidemia, unspecified: Secondary | ICD-10-CM | POA: Diagnosis not present

## 2015-08-11 DIAGNOSIS — J439 Emphysema, unspecified: Secondary | ICD-10-CM | POA: Diagnosis not present

## 2015-08-11 DIAGNOSIS — G4730 Sleep apnea, unspecified: Secondary | ICD-10-CM | POA: Diagnosis not present

## 2015-08-11 DIAGNOSIS — M1990 Unspecified osteoarthritis, unspecified site: Secondary | ICD-10-CM | POA: Insufficient documentation

## 2015-08-11 DIAGNOSIS — R51 Headache: Secondary | ICD-10-CM | POA: Insufficient documentation

## 2015-08-11 DIAGNOSIS — R079 Chest pain, unspecified: Secondary | ICD-10-CM | POA: Diagnosis not present

## 2015-08-11 DIAGNOSIS — D509 Iron deficiency anemia, unspecified: Secondary | ICD-10-CM | POA: Insufficient documentation

## 2015-08-11 DIAGNOSIS — Z9861 Coronary angioplasty status: Secondary | ICD-10-CM | POA: Insufficient documentation

## 2015-08-11 DIAGNOSIS — Z86010 Personal history of colonic polyps: Secondary | ICD-10-CM | POA: Diagnosis not present

## 2015-08-11 DIAGNOSIS — Z8719 Personal history of other diseases of the digestive system: Secondary | ICD-10-CM | POA: Insufficient documentation

## 2015-08-11 DIAGNOSIS — N183 Chronic kidney disease, stage 3 (moderate): Secondary | ICD-10-CM | POA: Diagnosis not present

## 2015-08-11 DIAGNOSIS — Z87891 Personal history of nicotine dependence: Secondary | ICD-10-CM | POA: Insufficient documentation

## 2015-08-11 DIAGNOSIS — Z7902 Long term (current) use of antithrombotics/antiplatelets: Secondary | ICD-10-CM | POA: Insufficient documentation

## 2015-08-11 DIAGNOSIS — R4182 Altered mental status, unspecified: Secondary | ICD-10-CM | POA: Diagnosis present

## 2015-08-11 DIAGNOSIS — M545 Low back pain, unspecified: Secondary | ICD-10-CM

## 2015-08-11 DIAGNOSIS — R0602 Shortness of breath: Secondary | ICD-10-CM | POA: Diagnosis not present

## 2015-08-11 LAB — CBC WITH DIFFERENTIAL/PLATELET
Basophils Absolute: 0 10*3/uL (ref 0.0–0.1)
Basophils Relative: 0 %
EOS ABS: 0.3 10*3/uL (ref 0.0–0.7)
EOS PCT: 4 %
HCT: 38.8 % (ref 36.0–46.0)
Hemoglobin: 12.1 g/dL (ref 12.0–15.0)
LYMPHS ABS: 2.1 10*3/uL (ref 0.7–4.0)
LYMPHS PCT: 29 %
MCH: 23.9 pg — AB (ref 26.0–34.0)
MCHC: 31.2 g/dL (ref 30.0–36.0)
MCV: 76.5 fL — AB (ref 78.0–100.0)
MONO ABS: 0.8 10*3/uL (ref 0.1–1.0)
MONOS PCT: 11 %
Neutro Abs: 4.1 10*3/uL (ref 1.7–7.7)
Neutrophils Relative %: 56 %
PLATELETS: 227 10*3/uL (ref 150–400)
RBC: 5.07 MIL/uL (ref 3.87–5.11)
RDW: 15.3 % (ref 11.5–15.5)
WBC: 7.2 10*3/uL (ref 4.0–10.5)

## 2015-08-11 LAB — COMPREHENSIVE METABOLIC PANEL
ALT: 16 U/L (ref 14–54)
ANION GAP: 6 (ref 5–15)
AST: 17 U/L (ref 15–41)
Albumin: 3.4 g/dL — ABNORMAL LOW (ref 3.5–5.0)
Alkaline Phosphatase: 57 U/L (ref 38–126)
BUN: 22 mg/dL — ABNORMAL HIGH (ref 6–20)
CHLORIDE: 102 mmol/L (ref 101–111)
CO2: 31 mmol/L (ref 22–32)
CREATININE: 1.79 mg/dL — AB (ref 0.44–1.00)
Calcium: 8.6 mg/dL — ABNORMAL LOW (ref 8.9–10.3)
GFR, EST AFRICAN AMERICAN: 30 mL/min — AB (ref >60)
GFR, EST NON AFRICAN AMERICAN: 26 mL/min — AB (ref >60)
Glucose, Bld: 268 mg/dL — ABNORMAL HIGH (ref 65–99)
POTASSIUM: 4.1 mmol/L (ref 3.5–5.1)
SODIUM: 139 mmol/L (ref 135–145)
Total Bilirubin: 0.5 mg/dL (ref 0.3–1.2)
Total Protein: 6.5 g/dL (ref 6.5–8.1)

## 2015-08-11 MED ORDER — HYDROCODONE-ACETAMINOPHEN 5-325 MG PO TABS
1.0000 | ORAL_TABLET | Freq: Once | ORAL | Status: AC
  Administered 2015-08-11: 1 via ORAL
  Filled 2015-08-11: qty 1

## 2015-08-11 MED ORDER — CYCLOBENZAPRINE HCL 10 MG PO TABS
5.0000 mg | ORAL_TABLET | Freq: Once | ORAL | Status: AC
Start: 2015-08-11 — End: 2015-08-11
  Administered 2015-08-11: 5 mg via ORAL
  Filled 2015-08-11: qty 1

## 2015-08-11 MED ORDER — CYCLOBENZAPRINE HCL 10 MG PO TABS: 10.0000 mg | ORAL_TABLET | Freq: Two times a day (BID) | ORAL | Status: DC | PRN
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
## 2015-08-11 NOTE — Discharge Instructions (Signed)
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
## 2015-08-19 ENCOUNTER — Encounter (HOSPITAL_COMMUNITY): Payer: Self-pay | Admitting: Emergency Medicine

## 2015-08-19 ENCOUNTER — Emergency Department (HOSPITAL_COMMUNITY): Payer: Medicare Other

## 2015-08-19 ENCOUNTER — Observation Stay (HOSPITAL_COMMUNITY)
Admission: EM | Admit: 2015-08-19 | Discharge: 2015-08-23 | Disposition: A | Discharge status: Home / Self Care | Payer: Medicare Other | Attending: Emergency Medicine | Admitting: Emergency Medicine

## 2015-08-19 DIAGNOSIS — R55 Syncope and collapse: Principal | ICD-10-CM

## 2015-08-19 DIAGNOSIS — M109 Gout, unspecified: Secondary | ICD-10-CM | POA: Diagnosis not present

## 2015-08-19 DIAGNOSIS — I519 Heart disease, unspecified: Secondary | ICD-10-CM | POA: Diagnosis not present

## 2015-08-19 DIAGNOSIS — D509 Iron deficiency anemia, unspecified: Secondary | ICD-10-CM | POA: Insufficient documentation

## 2015-08-19 DIAGNOSIS — Z79899 Other long term (current) drug therapy: Secondary | ICD-10-CM | POA: Diagnosis not present

## 2015-08-19 DIAGNOSIS — E1121 Type 2 diabetes mellitus with diabetic nephropathy: Secondary | ICD-10-CM | POA: Insufficient documentation

## 2015-08-19 DIAGNOSIS — R42 Dizziness and giddiness: Secondary | ICD-10-CM | POA: Diagnosis not present

## 2015-08-19 DIAGNOSIS — H9190 Unspecified hearing loss, unspecified ear: Secondary | ICD-10-CM | POA: Diagnosis not present

## 2015-08-19 DIAGNOSIS — Z880 Allergy status to penicillin: Secondary | ICD-10-CM | POA: Diagnosis not present

## 2015-08-19 DIAGNOSIS — N183 Chronic kidney disease, stage 3 (moderate): Secondary | ICD-10-CM | POA: Diagnosis not present

## 2015-08-19 DIAGNOSIS — K219 Gastro-esophageal reflux disease without esophagitis: Secondary | ICD-10-CM | POA: Insufficient documentation

## 2015-08-19 DIAGNOSIS — Z794 Long term (current) use of insulin: Secondary | ICD-10-CM | POA: Diagnosis not present

## 2015-08-19 DIAGNOSIS — R51 Headache: Secondary | ICD-10-CM | POA: Diagnosis not present

## 2015-08-19 DIAGNOSIS — M1990 Unspecified osteoarthritis, unspecified site: Secondary | ICD-10-CM | POA: Diagnosis not present

## 2015-08-19 DIAGNOSIS — G4730 Sleep apnea, unspecified: Secondary | ICD-10-CM | POA: Diagnosis not present

## 2015-08-19 DIAGNOSIS — I129 Hypertensive chronic kidney disease with stage 1 through stage 4 chronic kidney disease, or unspecified chronic kidney disease: Secondary | ICD-10-CM | POA: Diagnosis not present

## 2015-08-19 DIAGNOSIS — I2510 Atherosclerotic heart disease of native coronary artery without angina pectoris: Secondary | ICD-10-CM | POA: Diagnosis not present

## 2015-08-19 DIAGNOSIS — R52 Pain, unspecified: Secondary | ICD-10-CM

## 2015-08-19 DIAGNOSIS — R404 Transient alteration of awareness: Secondary | ICD-10-CM | POA: Diagnosis not present

## 2015-08-19 DIAGNOSIS — Z7982 Long term (current) use of aspirin: Secondary | ICD-10-CM | POA: Insufficient documentation

## 2015-08-19 DIAGNOSIS — R0789 Other chest pain: Secondary | ICD-10-CM | POA: Diagnosis not present

## 2015-08-19 DIAGNOSIS — E785 Hyperlipidemia, unspecified: Secondary | ICD-10-CM | POA: Diagnosis not present

## 2015-08-19 DIAGNOSIS — M25562 Pain in left knee: Secondary | ICD-10-CM | POA: Diagnosis not present

## 2015-08-19 DIAGNOSIS — Z86010 Personal history of colonic polyps: Secondary | ICD-10-CM | POA: Insufficient documentation

## 2015-08-19 DIAGNOSIS — R079 Chest pain, unspecified: Secondary | ICD-10-CM | POA: Diagnosis present

## 2015-08-19 DIAGNOSIS — Z23 Encounter for immunization: Secondary | ICD-10-CM | POA: Insufficient documentation

## 2015-08-19 DIAGNOSIS — Z87891 Personal history of nicotine dependence: Secondary | ICD-10-CM | POA: Diagnosis not present

## 2015-08-19 LAB — CBC WITH DIFFERENTIAL/PLATELET
BASOS ABS: 0 10*3/uL (ref 0.0–0.1)
Basophils Relative: 1 %
Eosinophils Absolute: 0.3 10*3/uL (ref 0.0–0.7)
Eosinophils Relative: 3 %
HEMATOCRIT: 40.1 % (ref 36.0–46.0)
Hemoglobin: 12.5 g/dL (ref 12.0–15.0)
LYMPHS PCT: 33 %
Lymphs Abs: 2.5 10*3/uL (ref 0.7–4.0)
MCH: 23.7 pg — ABNORMAL LOW (ref 26.0–34.0)
MCHC: 31.2 g/dL (ref 30.0–36.0)
MCV: 75.9 fL — AB (ref 78.0–100.0)
Monocytes Absolute: 0.6 10*3/uL (ref 0.1–1.0)
Monocytes Relative: 9 %
NEUTROS ABS: 4 10*3/uL (ref 1.7–7.7)
Neutrophils Relative %: 54 %
PLATELETS: 231 10*3/uL (ref 150–400)
RBC: 5.28 MIL/uL — AB (ref 3.87–5.11)
RDW: 15.2 % (ref 11.5–15.5)
WBC: 7.4 10*3/uL (ref 4.0–10.5)

## 2015-08-19 LAB — GLUCOSE, CAPILLARY: GLUCOSE-CAPILLARY: 262 mg/dL — AB (ref 65–99)

## 2015-08-19 LAB — COMPREHENSIVE METABOLIC PANEL
ALT: 16 U/L (ref 14–54)
AST: 17 U/L (ref 15–41)
Albumin: 3.8 g/dL (ref 3.5–5.0)
Alkaline Phosphatase: 52 U/L (ref 38–126)
Anion gap: 8 (ref 5–15)
BILIRUBIN TOTAL: 0.5 mg/dL (ref 0.3–1.2)
BUN: 23 mg/dL — AB (ref 6–20)
CHLORIDE: 104 mmol/L (ref 101–111)
CO2: 29 mmol/L (ref 22–32)
CREATININE: 1.98 mg/dL — AB (ref 0.44–1.00)
Calcium: 9.1 mg/dL (ref 8.9–10.3)
GFR calc Af Amer: 27 mL/min — ABNORMAL LOW (ref >60)
GFR, EST NON AFRICAN AMERICAN: 23 mL/min — AB (ref >60)
GLUCOSE: 191 mg/dL — AB (ref 65–99)
Potassium: 4.1 mmol/L (ref 3.5–5.1)
Sodium: 141 mmol/L (ref 135–145)
Total Protein: 7 g/dL (ref 6.5–8.1)

## 2015-08-19 LAB — TSH: TSH: 3.437 u[IU]/mL (ref 0.350–4.500)

## 2015-08-19 LAB — TROPONIN I
TROPONIN I: 0.04 ng/mL — AB (ref <0.031)
TROPONIN I: 0.04 ng/mL — AB (ref <0.031)

## 2015-08-19 MED ORDER — INFLUENZA VAC SPLIT QUAD 0.5 ML IM SUSY
0.5000 mL | PREFILLED_SYRINGE | INTRAMUSCULAR | Status: AC
  Administered 2015-08-20: 0.5 mL via INTRAMUSCULAR
  Filled 2015-08-19: qty 0.5

## 2015-08-19 MED ORDER — MONTELUKAST SODIUM 10 MG PO TABS
10.0000 mg | ORAL_TABLET | Freq: Every day | ORAL | Status: DC
  Administered 2015-08-19 – 2015-08-22 (×4): 10 mg via ORAL
  Filled 2015-08-19 (×4): qty 1

## 2015-08-19 MED ORDER — LORATADINE 10 MG PO TABS
5.0000 mg | ORAL_TABLET | Freq: Every evening | ORAL | Status: DC
  Administered 2015-08-19 – 2015-08-23 (×5): 5 mg via ORAL
  Filled 2015-08-19 (×5): qty 1

## 2015-08-19 MED ORDER — PNEUMOCOCCAL VAC POLYVALENT 25 MCG/0.5ML IJ INJ
0.5000 mL | INJECTION | INTRAMUSCULAR | Status: AC
  Administered 2015-08-20: 0.5 mL via INTRAMUSCULAR
  Filled 2015-08-19: qty 0.5

## 2015-08-19 MED ORDER — NEBIVOLOL HCL 10 MG PO TABS
5.0000 mg | ORAL_TABLET | Freq: Two times a day (BID) | ORAL | Status: DC
  Administered 2015-08-19 – 2015-08-23 (×8): 5 mg via ORAL
  Filled 2015-08-19 (×10): qty 1

## 2015-08-19 MED ORDER — EZETIMIBE 10 MG PO TABS
10.0000 mg | ORAL_TABLET | Freq: Every morning | ORAL | Status: DC
  Administered 2015-08-20 – 2015-08-23 (×4): 10 mg via ORAL
  Filled 2015-08-19 (×4): qty 1

## 2015-08-19 MED ORDER — FERROUS SULFATE 325 (65 FE) MG PO TABS
325.0000 mg | ORAL_TABLET | Freq: Every day | ORAL | Status: DC
Start: 2015-08-19 — End: 2015-08-23
  Administered 2015-08-19 – 2015-08-23 (×5): 325 mg via ORAL
  Filled 2015-08-19 (×5): qty 1

## 2015-08-19 MED ORDER — INSULIN ASPART 100 UNIT/ML ~~LOC~~ SOLN
8.0000 [IU] | Freq: Three times a day (TID) | SUBCUTANEOUS | Status: DC
  Administered 2015-08-20 – 2015-08-23 (×12): 8 [IU] via SUBCUTANEOUS

## 2015-08-19 MED ORDER — MOMETASONE FURO-FORMOTEROL FUM 100-5 MCG/ACT IN AERO
INHALATION_SPRAY | RESPIRATORY_TRACT | Status: AC
  Filled 2015-08-19: qty 8.8

## 2015-08-19 MED ORDER — LEVOCETIRIZINE DIHYDROCHLORIDE 5 MG PO TABS: 5.0000 mg | ORAL_TABLET | Freq: Every evening | ORAL | Status: DC

## 2015-08-19 MED ORDER — ASPIRIN EC 81 MG PO TBEC
81.0000 mg | DELAYED_RELEASE_TABLET | Freq: Every morning | ORAL | Status: DC
  Administered 2015-08-20 – 2015-08-23 (×4): 81 mg via ORAL
  Filled 2015-08-19 (×4): qty 1

## 2015-08-19 MED ORDER — ACETAMINOPHEN 325 MG PO TABS
650.0000 mg | ORAL_TABLET | Freq: Four times a day (QID) | ORAL | Status: DC | PRN
  Administered 2015-08-19 – 2015-08-23 (×7): 650 mg via ORAL
  Filled 2015-08-19 (×6): qty 2

## 2015-08-19 MED ORDER — ONDANSETRON HCL 4 MG PO TABS: 4.0000 mg | ORAL_TABLET | Freq: Four times a day (QID) | ORAL | Status: DC | PRN

## 2015-08-19 MED ORDER — LISINOPRIL 10 MG PO TABS
10.0000 mg | ORAL_TABLET | Freq: Every day | ORAL | Status: DC
  Administered 2015-08-20 – 2015-08-23 (×4): 10 mg via ORAL
  Filled 2015-08-19 (×4): qty 1

## 2015-08-19 MED ORDER — CLOPIDOGREL BISULFATE 75 MG PO TABS
75.0000 mg | ORAL_TABLET | Freq: Every day | ORAL | Status: DC
  Administered 2015-08-20 – 2015-08-23 (×4): 75 mg via ORAL
  Filled 2015-08-19 (×4): qty 1

## 2015-08-19 MED ORDER — LIRAGLUTIDE 18 MG/3ML ~~LOC~~ SOPN
1.8000 mg | PEN_INJECTOR | Freq: Every day | SUBCUTANEOUS | Status: DC
  Filled 2015-08-19: qty 3

## 2015-08-19 MED ORDER — BUDESONIDE-FORMOTEROL FUMARATE 160-4.5 MCG/ACT IN AERO
INHALATION_SPRAY | RESPIRATORY_TRACT | Status: AC
  Filled 2015-08-19: qty 6

## 2015-08-19 MED ORDER — CALCITRIOL 0.25 MCG PO CAPS
0.2500 ug | ORAL_CAPSULE | Freq: Every day | ORAL | Status: DC
  Administered 2015-08-20 – 2015-08-23 (×4): 0.25 ug via ORAL
  Filled 2015-08-19 (×4): qty 1

## 2015-08-19 MED ORDER — MOMETASONE FURO-FORMOTEROL FUM 100-5 MCG/ACT IN AERO
2.0000 | INHALATION_SPRAY | Freq: Two times a day (BID) | RESPIRATORY_TRACT | Status: DC
  Administered 2015-08-19 – 2015-08-23 (×8): 2 via RESPIRATORY_TRACT
  Filled 2015-08-19: qty 8.8

## 2015-08-19 MED ORDER — ROSUVASTATIN CALCIUM 20 MG PO TABS
20.0000 mg | ORAL_TABLET | Freq: Every evening | ORAL | Status: DC
  Administered 2015-08-19 – 2015-08-23 (×5): 20 mg via ORAL
  Filled 2015-08-19 (×5): qty 1

## 2015-08-19 MED ORDER — INSULIN ASPART 100 UNIT/ML ~~LOC~~ SOLN
0.0000 [IU] | Freq: Three times a day (TID) | SUBCUTANEOUS | Status: DC
  Administered 2015-08-20: 2 [IU] via SUBCUTANEOUS
  Administered 2015-08-20: 5 [IU] via SUBCUTANEOUS
  Administered 2015-08-20 – 2015-08-21 (×2): 4 [IU] via SUBCUTANEOUS
  Administered 2015-08-21 (×2): 3 [IU] via SUBCUTANEOUS
  Administered 2015-08-22: 2 [IU] via SUBCUTANEOUS
  Administered 2015-08-22: 3 [IU] via SUBCUTANEOUS
  Administered 2015-08-23: 2 [IU] via SUBCUTANEOUS

## 2015-08-19 MED ORDER — INSULIN GLARGINE 100 UNIT/ML ~~LOC~~ SOLN
30.0000 [IU] | Freq: Two times a day (BID) | SUBCUTANEOUS | Status: DC
  Administered 2015-08-19 – 2015-08-23 (×8): 30 [IU] via SUBCUTANEOUS
  Filled 2015-08-19 (×10): qty 0.3

## 2015-08-19 MED ORDER — FAMOTIDINE 20 MG PO TABS
20.0000 mg | ORAL_TABLET | Freq: Two times a day (BID) | ORAL | Status: DC
  Administered 2015-08-19 – 2015-08-23 (×8): 20 mg via ORAL
  Filled 2015-08-19 (×8): qty 1

## 2015-08-19 MED ORDER — SODIUM CHLORIDE 0.9 % IJ SOLN
3.0000 mL | Freq: Two times a day (BID) | INTRAMUSCULAR | Status: DC
  Administered 2015-08-19 – 2015-08-23 (×8): 3 mL via INTRAVENOUS

## 2015-08-19 MED ORDER — ENOXAPARIN SODIUM 30 MG/0.3ML ~~LOC~~ SOLN
30.0000 mg | SUBCUTANEOUS | Status: DC
  Administered 2015-08-19 – 2015-08-22 (×4): 30 mg via SUBCUTANEOUS
  Filled 2015-08-19 (×4): qty 0.3

## 2015-08-19 MED ORDER — ONDANSETRON HCL 4 MG/2ML IJ SOLN: 4.0000 mg | Freq: Four times a day (QID) | INTRAMUSCULAR | Status: DC | PRN

## 2015-08-19 MED ORDER — BUDESONIDE-FORMOTEROL FUMARATE 160-4.5 MCG/ACT IN AERO
2.0000 | INHALATION_SPRAY | Freq: Two times a day (BID) | RESPIRATORY_TRACT | Status: DC
  Administered 2015-08-19 – 2015-08-23 (×8): 2 via RESPIRATORY_TRACT
  Filled 2015-08-19: qty 6

## 2015-08-19 MED ORDER — NITROGLYCERIN 0.3 MG SL SUBL
0.3000 mg | SUBLINGUAL_TABLET | SUBLINGUAL | Status: DC | PRN
  Filled 2015-08-19: qty 100

## 2015-08-19 MED ORDER — RANOLAZINE ER 500 MG PO TB12
500.0000 mg | ORAL_TABLET | Freq: Two times a day (BID) | ORAL | Status: DC
  Administered 2015-08-19 – 2015-08-23 (×8): 500 mg via ORAL
  Filled 2015-08-19 (×8): qty 1

## 2015-08-19 MED ORDER — INSULIN ASPART 100 UNIT/ML ~~LOC~~ SOLN
0.0000 [IU] | Freq: Every day | SUBCUTANEOUS | Status: DC
  Administered 2015-08-19: 3 [IU] via SUBCUTANEOUS
  Administered 2015-08-22: 2 [IU] via SUBCUTANEOUS

## 2015-08-19 MED ORDER — ALUM & MAG HYDROXIDE-SIMETH 200-200-20 MG/5ML PO SUSP: 30.0000 mL | Freq: Four times a day (QID) | ORAL | Status: DC | PRN

## 2015-08-19 NOTE — ED Notes (Addendum)
Family states concern over left eyelid drooping. Eyelid noted to droop. Grip on right side is slightly weaker than left, but all other neuro tests equal. MD notified.
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
## 2015-08-19 NOTE — ED Notes (Signed)
Patient with near syncope at [place] office today. Patient arrives alert/oriented x 4. States pain under left breast.
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
## 2015-08-19 NOTE — ED Notes (Signed)
Patient alert and asking for a meal tray.

## 2015-08-20 ENCOUNTER — Observation Stay (HOSPITAL_COMMUNITY): Payer: Medicare Other

## 2015-08-20 DIAGNOSIS — R55 Syncope and collapse: Secondary | ICD-10-CM | POA: Diagnosis present

## 2015-08-20 DIAGNOSIS — Z87891 Personal history of nicotine dependence: Secondary | ICD-10-CM | POA: Diagnosis not present

## 2015-08-20 DIAGNOSIS — Z794 Long term (current) use of insulin: Secondary | ICD-10-CM | POA: Diagnosis not present

## 2015-08-20 DIAGNOSIS — H9190 Unspecified hearing loss, unspecified ear: Secondary | ICD-10-CM | POA: Diagnosis not present

## 2015-08-20 DIAGNOSIS — Z79899 Other long term (current) drug therapy: Secondary | ICD-10-CM | POA: Diagnosis not present

## 2015-08-20 DIAGNOSIS — N183 Chronic kidney disease, stage 3 (moderate): Secondary | ICD-10-CM | POA: Diagnosis not present

## 2015-08-20 DIAGNOSIS — R079 Chest pain, unspecified: Secondary | ICD-10-CM | POA: Diagnosis not present

## 2015-08-20 DIAGNOSIS — Z23 Encounter for immunization: Secondary | ICD-10-CM | POA: Diagnosis not present

## 2015-08-20 DIAGNOSIS — M109 Gout, unspecified: Secondary | ICD-10-CM | POA: Diagnosis not present

## 2015-08-20 DIAGNOSIS — I519 Heart disease, unspecified: Secondary | ICD-10-CM | POA: Diagnosis not present

## 2015-08-20 DIAGNOSIS — K219 Gastro-esophageal reflux disease without esophagitis: Secondary | ICD-10-CM | POA: Diagnosis not present

## 2015-08-20 DIAGNOSIS — D509 Iron deficiency anemia, unspecified: Secondary | ICD-10-CM | POA: Diagnosis not present

## 2015-08-20 DIAGNOSIS — E785 Hyperlipidemia, unspecified: Secondary | ICD-10-CM | POA: Diagnosis not present

## 2015-08-20 DIAGNOSIS — I2510 Atherosclerotic heart disease of native coronary artery without angina pectoris: Secondary | ICD-10-CM | POA: Diagnosis not present

## 2015-08-20 DIAGNOSIS — I129 Hypertensive chronic kidney disease with stage 1 through stage 4 chronic kidney disease, or unspecified chronic kidney disease: Secondary | ICD-10-CM | POA: Diagnosis not present

## 2015-08-20 DIAGNOSIS — M25562 Pain in left knee: Secondary | ICD-10-CM | POA: Diagnosis not present

## 2015-08-20 DIAGNOSIS — G4730 Sleep apnea, unspecified: Secondary | ICD-10-CM | POA: Diagnosis not present

## 2015-08-20 DIAGNOSIS — M1990 Unspecified osteoarthritis, unspecified site: Secondary | ICD-10-CM | POA: Diagnosis not present

## 2015-08-20 DIAGNOSIS — Z86010 Personal history of colonic polyps: Secondary | ICD-10-CM | POA: Diagnosis not present

## 2015-08-20 DIAGNOSIS — Z7982 Long term (current) use of aspirin: Secondary | ICD-10-CM | POA: Diagnosis not present

## 2015-08-20 DIAGNOSIS — I6523 Occlusion and stenosis of bilateral carotid arteries: Secondary | ICD-10-CM | POA: Diagnosis not present

## 2015-08-20 DIAGNOSIS — Z880 Allergy status to penicillin: Secondary | ICD-10-CM | POA: Diagnosis not present

## 2015-08-20 DIAGNOSIS — E1121 Type 2 diabetes mellitus with diabetic nephropathy: Secondary | ICD-10-CM | POA: Diagnosis not present

## 2015-08-20 LAB — BASIC METABOLIC PANEL
ANION GAP: 7 (ref 5–15)
BUN: 26 mg/dL — ABNORMAL HIGH (ref 6–20)
CALCIUM: 8.8 mg/dL — AB (ref 8.9–10.3)
CHLORIDE: 106 mmol/L (ref 101–111)
CO2: 26 mmol/L (ref 22–32)
Creatinine, Ser: 2.02 mg/dL — ABNORMAL HIGH (ref 0.44–1.00)
GFR calc non Af Amer: 23 mL/min — ABNORMAL LOW (ref >60)
GFR, EST AFRICAN AMERICAN: 26 mL/min — AB (ref >60)
GLUCOSE: 196 mg/dL — AB (ref 65–99)
POTASSIUM: 3.8 mmol/L (ref 3.5–5.1)
Sodium: 139 mmol/L (ref 135–145)

## 2015-08-20 LAB — GLUCOSE, CAPILLARY
GLUCOSE-CAPILLARY: 194 mg/dL — AB (ref 65–99)
GLUCOSE-CAPILLARY: 207 mg/dL — AB (ref 65–99)
GLUCOSE-CAPILLARY: 140 mg/dL — AB (ref 65–99)
Glucose-Capillary: 118 mg/dL — ABNORMAL HIGH (ref 65–99)

## 2015-08-20 LAB — TROPONIN I
TROPONIN I: 0.05 ng/mL — AB (ref <0.031)
Troponin I: 0.05 ng/mL — ABNORMAL HIGH (ref <0.031)

## 2015-08-20 MED ORDER — NITROGLYCERIN 0.4 MG SL SUBL: 0.4000 mg | SUBLINGUAL_TABLET | SUBLINGUAL | Status: DC | PRN
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
## 2015-08-21 DIAGNOSIS — D509 Iron deficiency anemia, unspecified: Secondary | ICD-10-CM | POA: Diagnosis not present

## 2015-08-21 DIAGNOSIS — S86812A Strain of other muscle(s) and tendon(s) at lower leg level, left leg, initial encounter: Secondary | ICD-10-CM | POA: Diagnosis not present

## 2015-08-21 DIAGNOSIS — I2510 Atherosclerotic heart disease of native coronary artery without angina pectoris: Secondary | ICD-10-CM | POA: Diagnosis not present

## 2015-08-21 DIAGNOSIS — E785 Hyperlipidemia, unspecified: Secondary | ICD-10-CM | POA: Diagnosis not present

## 2015-08-21 DIAGNOSIS — R55 Syncope and collapse: Secondary | ICD-10-CM | POA: Diagnosis not present

## 2015-08-21 DIAGNOSIS — R0789 Other chest pain: Secondary | ICD-10-CM | POA: Diagnosis not present

## 2015-08-21 DIAGNOSIS — M25562 Pain in left knee: Secondary | ICD-10-CM | POA: Diagnosis not present

## 2015-08-21 DIAGNOSIS — G4730 Sleep apnea, unspecified: Secondary | ICD-10-CM | POA: Diagnosis not present

## 2015-08-21 DIAGNOSIS — M1990 Unspecified osteoarthritis, unspecified site: Secondary | ICD-10-CM | POA: Diagnosis not present

## 2015-08-21 LAB — GLUCOSE, CAPILLARY
GLUCOSE-CAPILLARY: 166 mg/dL — AB (ref 65–99)
GLUCOSE-CAPILLARY: 178 mg/dL — AB (ref 65–99)
GLUCOSE-CAPILLARY: 177 mg/dL — AB (ref 65–99)
Glucose-Capillary: 179 mg/dL — ABNORMAL HIGH (ref 65–99)
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
## 2015-08-22 ENCOUNTER — Ambulatory Visit: Payer: Medicare Other | Admitting: "Endocrinology

## 2015-08-22 ENCOUNTER — Observation Stay (HOSPITAL_COMMUNITY): Payer: Medicare Other

## 2015-08-22 ENCOUNTER — Ambulatory Visit: Payer: Medicare Other | Admitting: Nutrition

## 2015-08-22 DIAGNOSIS — M1990 Unspecified osteoarthritis, unspecified site: Secondary | ICD-10-CM | POA: Diagnosis not present

## 2015-08-22 DIAGNOSIS — M25562 Pain in left knee: Secondary | ICD-10-CM | POA: Diagnosis not present

## 2015-08-22 DIAGNOSIS — R55 Syncope and collapse: Secondary | ICD-10-CM | POA: Diagnosis not present

## 2015-08-22 DIAGNOSIS — D509 Iron deficiency anemia, unspecified: Secondary | ICD-10-CM | POA: Diagnosis not present

## 2015-08-22 DIAGNOSIS — R0789 Other chest pain: Secondary | ICD-10-CM | POA: Diagnosis not present

## 2015-08-22 DIAGNOSIS — I2510 Atherosclerotic heart disease of native coronary artery without angina pectoris: Secondary | ICD-10-CM | POA: Diagnosis not present

## 2015-08-22 DIAGNOSIS — E785 Hyperlipidemia, unspecified: Secondary | ICD-10-CM | POA: Diagnosis not present

## 2015-08-22 DIAGNOSIS — G4730 Sleep apnea, unspecified: Secondary | ICD-10-CM | POA: Diagnosis not present

## 2015-08-22 LAB — GLUCOSE, CAPILLARY
GLUCOSE-CAPILLARY: 188 mg/dL — AB (ref 65–99)
Glucose-Capillary: 149 mg/dL — ABNORMAL HIGH (ref 65–99)
Glucose-Capillary: 88 mg/dL (ref 65–99)
Glucose-Capillary: 203 mg/dL — ABNORMAL HIGH (ref 65–99)

## 2015-08-22 NOTE — Progress Notes (Signed)
Patient called for CPAP to be checked, stated it was blowing to hard. Upon check it was at 5 cm decreased to 4 she stated it was better machine is on auto. Suspect she is a little more confused tonight.

## 2015-08-22 NOTE — Progress Notes (Signed)
MRI follow-up  Patient has a patellofemoral ligament injury and MCL injury and will need bracing   Recommend T scope knee brace   I will order it  Patient can be discharged once brace is applied   She should follow with me in 3 weeks

## 2015-08-22 NOTE — Evaluation (Signed)
Physical Therapy Evaluation Patient Details Name: Krystal [name] MRN: [id number] DOB: October 20, 1936 Today's Date: 08/22/2015   History of Present Illness  Krystal [name] is an 79 y.o. female with hx of DJD, known CAD s/p cardiac stents, HTN , GERD, CKD, anxiety, sleep apnea, admitted for syncopal episode and atypical CP. She had troponins in the 0.04 ranges, and is currently pain free. Carotid US showed plaques, but non critical stenosis, and CT scan showed small vessel disease, no acute process. She had pain in her left knee and Xray was negative. She can't walk on it.  Clinical Impression   Pt was seen for initial evaluation.  She was alert and oriented, cooperative.  She reports that she lives alone and normally is independent with a cane or walker.  She sometimes gets about in a w/c due to chronic lumbar pain.  She has not experienced any further sx of syncope since here.  Her left knee was found to have moderate visible edema and strength/ROM are limited due to pain with movement.  She actually has pain in all joints of both LEs with movement or weight bearing however no other joints exhibit any edema or joint tenderness.  DF/PF elicit pain in the soles of her feet (rather unusual) and we were only able to achieve 30* of left knee flexion in supine whereas during sitting the knee naturally flexed to 90*.  She needed min assist to transfer OOB and she was only able to ambulate 20' with a walker, pain in both LEs to a level of 8/10 bilaterally.  MRI is negative for any fx or tendon/ligament rupture.  When asked how she planned to manage at home, she said that she did not know.  At this point, she would be appropriate for either SNF or ACLF as she states that she has no one to assist her at home.  Given her perceived pain level, she is a rather high fall risk.    Follow Up Recommendations Home health PT;SNF (to be determined)    Equipment Recommendations  None recommended by PT     Recommendations for Other Services   none    Precautions / Restrictions Precautions Precautions: Fall Restrictions Weight Bearing Restrictions: No      Mobility  Bed Mobility Overal bed mobility: Needs Assistance Bed Mobility: Sidelying to Sit   Sidelying to sit: Min assist;HOB elevated       General bed mobility comments: transfer is very slow and labored, needs assistance to move the LLE over the EOB  Transfers Overall transfer level: Needs assistance Equipment used: Rolling walker (2 wheeled) Transfers: Sit to/from Stand              Ambulation/Gait Ambulation/Gait assistance: Supervision Ambulation Distance (Feet): 20 Feet Assistive device: Rolling walker (2 wheeled) Gait Pattern/deviations: Antalgic   Gait velocity interpretation: <1.8 ft/sec, indicative of risk for recurrent falls General Gait Details: pt reports pain in both LEs during gait, primarily in the left knee and right hip  Stairs            Wheelchair Mobility    Modified Rankin (Stroke Patients Only)       Balance Overall balance assessment: No apparent balance deficits (not formally assessed)                                           Pertinent Vitals/Pain Pain Assessment:  0-10 Pain Score: 10-Worst pain ever Pain Location: both LEs with pain in the sole of the foot upon DF and PF, knee pain with flexion and hip pain with hip flexion and abduction Pain Descriptors / Indicators: Aching;Throbbing Pain Intervention(s): Limited activity within patient's tolerance;Repositioned;Ice applied    Home Living Family/patient expects to be discharged to:: Unsure Living Arrangements: Alone               Additional Comments: Pt lives alone in a 1st floor apartment, no steps    Prior Function Level of Independence: Independent with assistive device(s)         Comments: uses a cane or walker for most gait, occasionally mobilizes in a w/c within the home...she is  independent with all ADLs     Hand Dominance        Extremity/Trunk Assessment               Lower Extremity Assessment: RLE deficits/detail;LLE deficits/detail RLE Deficits / Details: limited DF and PF due to pain created in the sole of the foot...able to flex knee to 80 degrees with antalgia, all LE ROM limited by generalized pain with movement LLE Deficits / Details: limited DF/PF due pain created in the sole of the foot, able to flex knee to 30* in supine with medial knee pain verbalized, hip ROM limited by pain in hip  Cervical / Trunk Assessment: Normal  Communication   Communication: No difficulties  Cognition Arousal/Alertness: Awake/alert Behavior During Therapy: WFL for tasks assessed/performed Overall Cognitive Status: Within Functional Limits for tasks assessed                      General Comments      Exercises        Assessment/Plan    PT Assessment Patient needs continued PT services  PT Diagnosis Difficulty walking;Abnormality of gait;Acute pain   PT Problem List Decreased strength;Decreased range of motion;Decreased activity tolerance;Decreased mobility;Pain  PT Treatment Interventions Gait training;Functional mobility training;Therapeutic exercise   PT Goals (Current goals can be found in the Care Plan section) Acute Rehab PT Goals Patient Stated Goal: none stated PT Goal Formulation: With patient Time For Goal Achievement: 09/05/15 Potential to Achieve Goals: Good    Frequency 7X/week   Barriers to discharge Decreased caregiver support lives alone, states that she has no one at home to assist her    Co-evaluation               End of Session Equipment Utilized During Treatment: Gait belt Activity Tolerance: Patient limited by pain Patient left: in chair;with call bell/phone within reach;Other (comment) (ice on left knee)      Functional Assessment Tool Used: clinical judgement Functional Limitation: Mobility: Walking  and moving around Mobility: Walking and Moving Around Current Status [phone]): At least 20 percent but less than 40 percent impaired, limited or restricted Mobility: Walking and Moving Around Goal Status [phone]): At least 1 percent but less than 20 percent impaired, limited or restricted    Time: 9563-8756 PT Time Calculation (min) (ACUTE ONLY): 56 min   Charges:   PT Evaluation $Initial PT Evaluation Tier I: 1 Procedure     PT G Codes:   PT G-Codes **NOT FOR INPATIENT CLASS** Functional Assessment Tool Used: clinical judgement Functional Limitation: Mobility: Walking and moving around Mobility: Walking and Moving Around Current Status (E3329): At least 20 percent but less than 40 percent impaired, limited or restricted Mobility: Walking and Moving Around Goal Status (  G8979): At least 1 percent but less than 20 percent impaired, limited or restricted    Sable Feil  PT 08/22/2015, 2:01 PM [phone]

## 2015-08-22 NOTE — Care Management Note (Signed)
Case Management Note  Patient Details  Name: Krystal [name] MRN: [id number] Date of Birth: 1936-03-05  Expected Discharge Date:     08/22/2015             Expected Discharge Plan:  [place]  In-House Referral:  NA  Discharge planning Services  CM Consult  Post Acute Care Choice:  Durable Medical Equipment, Home Health Choice offered to:  Patient  DME Arranged:  Walker rolling DME Agency:  [place] Arranged:  RN, PT, Nurse's Aide Jeff Agency:  [place]  Status of Service:  Completed, signed off  Medicare Important Message Given:    Date Medicare IM Given:    Medicare IM give by:    Date Additional Medicare IM Given:    Additional Medicare Important Message give by:     If discussed at [place] of Stay Meetings, dates discussed:    Additional Comments: Pt is from home, lives alone and is ind at baseline. Pt admitted after syncopal episode. Pt discharging home with self care pending ortho clearance. PT has recommended HH PT. Discussed with MD and walker and RN, PT, aid services will be ordered at DC. DC possible today. Pt requests [facility] as she has used them in the past. Romualdo Bolk of [facility], made aware of referral and will obtain pt info from chart. Gilford Rile will also be delivered from [facility] to pt's room. Pt has wheelchair at home for PRN USE. Pt made aware the [facility] has 48 hours to make first visit. No further CM needs identified.  Sherald Barge, RN 08/22/2015, 3:02 PM

## 2015-08-22 NOTE — Consult Note (Signed)
Reason for Consult: Left knee pain Referring Physician: Dr Veneta Penton is an 79 y.o. female.  HPI: 79 year old female was in a local [employer] office became dizzy lightheaded and collapsed. Presented to the emergency room for workup. On Sunday she was otherwise ready for discharge except she complained of severe left knee pain and weakness the left lower extremity as well as inability to fully extend or fully flex the left knee. The report was that her pain was severe however the nurses that she could ambulate 1 she got going as long as she uses support  No previous history of major knee problems   review of systems light headedness and dizziness has resolved is no shortness of breath  Past Medical History  Diagnosis Date  . Sleep apnea     CPAP  . Myalgia   . Palpitations   . Microcytic anemia     History of occult blood in stool  . DJD (degenerative joint disease)   . CAD (coronary artery disease) 2009    5 stents- #3 in RCA, #1 each in LAD and AVG  . Osteoarthritis   . Essential hypertension   . Hyperlipidemia   . GERD (gastroesophageal reflux disease)   . Allergic rhinitis   . Personal history of colonic polyps   . HOH (hard of hearing)   . MI (myocardial infarction) 1998  . Glaucoma   . Diastolic dysfunction, left ventricle   . CKD (chronic kidney disease) stage 3, GFR 30-59 ml/min   . Emphysema lung     2L N/C continuously  . Complete heart block, transient 2014  . Gout   . Type II diabetes mellitus with nephropathy   . History of uterine cancer     Past Surgical History  Procedure Laterality Date  . Ectopic pregnancy surgery    . Foot surgery      Left and right for callous  . Cervical discectomy      L5 left/hemilminectomy  . Colonoscopy  09/2006    Int hemmorhoids, COMPLICATED BY CARDIOPULMONARY COMPLICATIONS  . Abdominal hysterectomy  04/2010    Uterine cancer,TAHBSO  . Polypectomy  10/22/2011    Internal hemorrhoids/sessile polyp  . Coronary  angioplasty  1/99, 1/07, 1/08, 4/09    5 cardiac stents total  . Left and right heart catheterization with coronary angiogram N/A 09/12/2012    Procedure: LEFT AND RIGHT HEART CATHETERIZATION WITH CORONARY ANGIOGRAM;  Surgeon: Sanda Klein, MD;  Location: [facility] CATH LAB;  Service: Cardiovascular;  Laterality: N/A;    Family History  Problem Relation Age of Onset  . Anesthesia problems Neg Hx   . Hypotension Neg Hx   . Malignant hyperthermia Neg Hx   . Pseudochol deficiency Neg Hx     Social History:  reports that she quit smoking about 8 years ago. Her smoking use included Cigarettes. She started smoking about 60 years ago. She has a 20.4 pack-year smoking history. She has never used smokeless tobacco. She reports that she does not drink alcohol or use illicit drugs.  Allergies:  Allergies  Allergen Reactions  . Morphine Shortness Of Breath and Swelling  . Penicillins Shortness Of Breath and Swelling  . Shellfish Allergy     Medications: I have reviewed the patient's current medications.  Results for orders placed or performed during the hospital encounter of 08/19/15 (from the past 48 hour(s))  Glucose, capillary     Status: Abnormal   Collection Time: 08/20/15 11:23 AM  Result Value Ref  Range   Glucose-Capillary 207 (H) 65 - 99 mg/dL  Troponin I     Status: Abnormal   Collection Time: 08/20/15 11:24 AM  Result Value Ref Range   Troponin I 0.05 (H) <0.031 ng/mL    Comment:        PERSISTENTLY INCREASED TROPONIN VALUES IN THE RANGE OF 0.04-0.49 ng/mL CAN BE SEEN IN:       -UNSTABLE ANGINA       -CONGESTIVE HEART FAILURE       -MYOCARDITIS       -CHEST TRAUMA       -ARRYHTHMIAS       -LATE PRESENTING MYOCARDIAL INFARCTION       -COPD   CLINICAL FOLLOW-UP RECOMMENDED.   Glucose, capillary     Status: Abnormal   Collection Time: 08/20/15  4:26 PM  Result Value Ref Range   Glucose-Capillary 140 (H) 65 - 99 mg/dL  Glucose, capillary     Status: Abnormal   Collection  Time: 08/20/15  8:32 PM  Result Value Ref Range   Glucose-Capillary 118 (H) 65 - 99 mg/dL  Glucose, capillary     Status: Abnormal   Collection Time: 08/21/15  7:54 AM  Result Value Ref Range   Glucose-Capillary 166 (H) 65 - 99 mg/dL   Comment 1 Notify RN   Glucose, capillary     Status: Abnormal   Collection Time: 08/21/15 11:12 AM  Result Value Ref Range   Glucose-Capillary 178 (H) 65 - 99 mg/dL   Comment 1 Notify RN   Glucose, capillary     Status: Abnormal   Collection Time: 08/21/15  4:40 PM  Result Value Ref Range   Glucose-Capillary 177 (H) 65 - 99 mg/dL   Comment 1 Notify RN   Glucose, capillary     Status: Abnormal   Collection Time: 08/21/15  8:25 PM  Result Value Ref Range   Glucose-Capillary 179 (H) 65 - 99 mg/dL  Glucose, capillary     Status: Abnormal   Collection Time: 08/22/15  7:57 AM  Result Value Ref Range   Glucose-Capillary 149 (H) 65 - 99 mg/dL    Dg Knee 1-2 Views Left  08/20/2015   CLINICAL DATA:  Left knee pain  EXAM: LEFT KNEE - 1-2 VIEW  COMPARISON:  None.  FINDINGS: There is no evidence of fracture, dislocation, or joint effusion. There is no evidence of arthropathy or other focal bone abnormality. Soft tissues are unremarkable. Mild arterial calcification.  IMPRESSION: No acute abnormality.  No significant degenerative change.   Electronically Signed   By: Franchot Gallo M.D.   On: 08/20/2015 10:17   US Carotid Bilateral  08/20/2015   CLINICAL DATA:  Transient ischemic attack  EXAM: BILATERAL CAROTID DUPLEX ULTRASOUND  TECHNIQUE: Pearline Cables scale imaging, color Doppler and duplex ultrasound were performed of bilateral carotid and vertebral arteries in the neck.  COMPARISON:  None.  FINDINGS: Criteria: Quantification of carotid stenosis is based on velocity parameters that correlate the residual internal carotid diameter with NASCET-based stenosis levels, using the diameter of the distal internal carotid lumen as the denominator for stenosis measurement.  The  following peak systolic velocity measurements were obtained:  RIGHT  ICA:  90 cm/sec  CCA:  95 cm/sec  SYSTOLIC ICA/CCA RATIO:  1.0  DIASTOLIC ICA/CCA RATIO:  3.8  ECA:  133 cm/sec  LEFT  ICA:  102 cm/sec  CCA:  77 cm/sec  SYSTOLIC ICA/CCA RATIO:  1.3  DIASTOLIC ICA/CCA RATIO:  2.0  ECA:  123  cm/sec  RIGHT CAROTID ARTERY: There is diffuse intimal thickening. There is decreased echogenicity plaque in the bifurcation extending into the proximal internal carotid artery causing approximately 50% diameter stenosis by real-time interrogation in the bifurcation and proximal right internal carotid artery. No higher grade obstruction is appreciated by real-time interrogation.  RIGHT VERTEBRAL ARTERY:  Flow is in the anatomic direction.  LEFT CAROTID ARTERY: There is diffuse intimal thickening. There is slightly irregular plaque in the bifurcation and proximal internal carotid artery causing approximately 50% diameter stenosis by real-time interrogation. No higher grade obstruction seen by real-time interrogation.  LEFT VERTEBRAL ARTERY:  Flow is in the anatomic direction.  IMPRESSION: Atherosclerotic plaque in both bifurcation and proximal internal carotid artery regions, slightly irregular on the left. By real-time interrogation, there is approximately 50% diameter stenosis in each bifurcation and proximal internal carotid artery regions. No stenosis of higher grade is appreciated by either real-time or waveform criteria. Flow in each vertebral artery is antegrade.   Electronically Signed   By: Lowella Grip III M.D.   On: 08/20/2015 10:10    ROS Blood pressure 137/65, pulse 68, temperature 98.8 F (37.1 C), temperature source Oral, resp. rate 16, height 5\' 7"  (1.702 m), weight 189 lb 1.6 oz (85.775 kg), SpO2 98 %. Physical Exam Normal grooming hygiene oriented 3 mood pleasant  Holes knee in flexion the medial side of the knee is swollen there is a crease across the front of the knee which extends towards the  end and into the patellar tendon area. Quadriceps tendon nontender patella tendon tender medial joint line tenderness medial collateral ligament tender no instability is noted joint effusion is not appreciated she can extend the knee with a left with a lag skin is intact but there is a crease across the skin at the point where she must a fall and neurovascular exam is intact   Assessment/Plan: Knee contusion cannot rule out patellar tendon rupture partial or complete  Recommend MRI in the morning  Brace in the as needed for ambulation until MRI completed  Arther Abbott 08/22/2015, 8:53 AM

## 2015-08-22 NOTE — Progress Notes (Signed)
[facility] PROGRESS NOTE  LETHA MIRABAL HFW:[id number] DOB: 1936-07-23    PCP:   [name], MD   HPI: Krystal [name] is an 79 y.o. female with hx of DJD, known CAD s/p cardiac stents, HTN , GERD, CKD, anxiety, sleep apnea, admitted for syncopal episode and atypical CP. She had troponins in the 0.04 ranges, and is currently pain free. Carotid US showed plaques, but non critical stenosis, and CT scan showed small vessel disease, no acute process. She had pain in her left knee and Xray was negative. She can't walk on it. She did try to walk to the BR and back today with assistance. Dr Aline Brochure has seen her and MRI was performed.   Rewiew of Systems:  Constitutional: Negative for malaise, fever and chills. No significant weight loss or weight gain Eyes: Negative for eye pain, redness and discharge, diplopia, visual changes, or flashes of light. ENMT: Negative for ear pain, hoarseness, nasal congestion, sinus pressure and sore throat. No headaches; tinnitus, drooling, or problem swallowing. Cardiovascular: Negative for chest pain, palpitations, diaphoresis, dyspnea and peripheral edema. ; No orthopnea, PND Respiratory: Negative for cough, hemoptysis, wheezing and stridor. No pleuritic chestpain. Gastrointestinal: Negative for nausea, vomiting, diarrhea, constipation, abdominal pain, melena, blood in stool, hematemesis, jaundice and rectal bleeding.    Genitourinary: Negative for frequency, dysuria, incontinence,flank pain and hematuria; Musculoskeletal: Negative for back pain and neck pain.  Skin: . Negative for pruritus, rash, abrasions, bruising and skin lesion.; ulcerations Neuro: Negative for headache, lightheadedness and neck stiffness. Negative for weakness, altered level of consciousness , altered mental status, extremity weakness, burning feet, involuntary movement, seizure and syncope.  Psych: negative for anxiety, depression, insomnia, tearfulness, panic attacks,  hallucinations, paranoia, suicidal or homicidal ideation   Past Medical History  Diagnosis Date  . Sleep apnea     CPAP  . Myalgia   . Palpitations   . Microcytic anemia     History of occult blood in stool  . DJD (degenerative joint disease)   . CAD (coronary artery disease) 2009    5 stents- #3 in RCA, #1 each in LAD and AVG  . Osteoarthritis   . Essential hypertension   . Hyperlipidemia   . GERD (gastroesophageal reflux disease)   . Allergic rhinitis   . Personal history of colonic polyps   . HOH (hard of hearing)   . MI (myocardial infarction) 1998  . Glaucoma   . Diastolic dysfunction, left ventricle   . CKD (chronic kidney disease) stage 3, GFR 30-59 ml/min   . Emphysema lung     2L N/C continuously  . Complete heart block, transient 2014  . Gout   . Type II diabetes mellitus with nephropathy   . History of uterine cancer     Past Surgical History  Procedure Laterality Date  . Ectopic pregnancy surgery    . Foot surgery      Left and right for callous  . Cervical discectomy      L5 left/hemilminectomy  . Colonoscopy  09/2006    Int hemmorhoids, COMPLICATED BY CARDIOPULMONARY COMPLICATIONS  . Abdominal hysterectomy  04/2010    Uterine cancer,TAHBSO  . Polypectomy  10/22/2011    Internal hemorrhoids/sessile polyp  . Coronary angioplasty  1/99, 1/07, 1/08, 4/09    5 cardiac stents total  . Left and right heart catheterization with coronary angiogram N/A 09/12/2012    Procedure: LEFT AND RIGHT HEART CATHETERIZATION WITH CORONARY ANGIOGRAM;  Surgeon: Sanda Klein, MD;  Location: [facility] CATH  LAB;  Service: Cardiovascular;  Laterality: N/A;    Medications:  HOME MEDS: Prior to Admission medications   Medication Sig Start Date End Date Taking? Authorizing Provider  aspirin 81 MG EC tablet Take 81 mg by mouth every morning.    Yes Historical Provider, MD  budesonide-formoterol (SYMBICORT) 160-4.5 MCG/ACT inhaler Inhale 2 puffs into the lungs 2 (two) times daily.     Yes Historical Provider, MD  calcitRIOL (ROCALTROL) 0.25 MCG capsule Take 0.25 mcg by mouth daily.  06/12/13  Yes Historical Provider, MD  clopidogrel (PLAVIX) 75 MG tablet TAKE 1 TABLET ONCE DAILY. 07/26/15  Yes [name], MD  ezetimibe (ZETIA) 10 MG tablet Take 10 mg by mouth every morning.    Yes Historical Provider, MD  febuxostat (ULORIC) 40 MG tablet Take 40 mg by mouth daily.   Yes Historical Provider, MD  ferrous sulfate 325 (65 FE) MG tablet Take 325 mg by mouth daily.    Yes Historical Provider, MD  Fluticasone-Salmeterol (ADVAIR DISKUS) 250-50 MCG/DOSE AEPB Inhale 1 puff into the lungs 2 (two) times daily.    Yes Sinda Du, MD  insulin glargine (LANTUS) 100 UNIT/ML injection Inject 30 Units into the skin 2 (two) times daily.    Yes Historical Provider, MD  isosorbide mononitrate (IMDUR) 60 MG 24 hr tablet Take 60 mg by mouth daily.   Yes Historical Provider, MD  Liraglutide (VICTOZA) 18 MG/3ML SOPN Inject 1.8 mLs into the skin daily. As directed   Yes Historical Provider, MD  lisinopril (PRINIVIL,ZESTRIL) 10 MG tablet TAKE (1) TABLET BY MOUTH ONCE DAILY. 06/24/15  Yes Lendon Colonel, NP  NITROSTAT 0.4 MG SL tablet PLACE ONE (1) TABLET UNDER TONGUE EVERY 5 MINUTES UP TO (3) DOSES AS NEEDED FOR CHEST PAIN. Patient taking differently: PLACE ONE (1) TABLET UNDER TONGUE EVERY 5 MINUTES UP TO (3) DOSES AS NEEDED FOR CHEST  used 2 weeks ago 03/04/15  Yes Troy Sine, MD  NOVOLOG FLEXPEN 100 UNIT/ML FlexPen USE 25 UNITS AT BREAKFAST, 20 UNITS AT LUNCH AND 35 UNITS AT SUPPER SUB-Q. Patient taking differently: 8 units before meals 04/28/14  Yes Elayne Snare, MD  ranolazine (RANEXA) 500 MG 12 hr tablet Take 1 tablet (500 mg total) by mouth 2 (two) times daily. 07/04/15  Yes [name], MD     Allergies:  Allergies  Allergen Reactions  . Morphine Shortness Of Breath and Swelling  . Penicillins Shortness Of Breath and Swelling  . Shellfish Allergy     Social History:    reports that she quit smoking about 8 years ago. Her smoking use included Cigarettes. She started smoking about 60 years ago. She has a 20.4 pack-year smoking history. She has never used smokeless tobacco. She reports that she does not drink alcohol or use illicit drugs.  Family History: Family History  Problem Relation Age of Onset  . Anesthesia problems Neg Hx   . Hypotension Neg Hx   . Malignant hyperthermia Neg Hx   . Pseudochol deficiency Neg Hx      Physical Exam: Filed Vitals:   08/22/15 0001 08/22/15 0152 08/22/15 0558 08/22/15 0737  BP:  179/74 137/65   Pulse: 73 72 68   Temp:  99 F (37.2 C) 98.8 F (37.1 C)   TempSrc:  Oral Oral   Resp: 16 16 16    Height:      Weight:   85.775 kg (189 lb 1.6 oz)   SpO2: 95% 100% 100% 98%   Blood pressure  137/65, pulse 68, temperature 98.8 F (37.1 C), temperature source Oral, resp. rate 16, height 5\' 7"  (1.702 m), weight 85.775 kg (189 lb 1.6 oz), SpO2 98 %.  GEN:  Pleasant  patient lying in the stretcher in no acute distress; cooperative with exam. PSYCH:  alert and oriented x4; does not appear anxious or depressed; affect is appropriate. HEENT: Mucous membranes pink and anicteric; PERRLA; EOM intact; no cervical lymphadenopathy nor thyromegaly or carotid bruit; no JVD; There were no stridor. Neck is very supple. Breasts:: Not examined CHEST WALL: No tenderness CHEST: Normal respiration, clear to auscultation bilaterally.  HEART: Regular rate and rhythm.  There are no murmur, rub, or gallops.   BACK: No kyphosis or scoliosis; no CVA tenderness ABDOMEN: soft and non-tender; no masses, no organomegaly, normal abdominal bowel sounds; no pannus; no intertriginous candida. There is no rebound and no distention. Rectal Exam: Not done EXTREMITIES: No bone or joint deformity; age-appropriate arthropathy of the hands and knees; no edema; no ulcerations.  There is no calf tenderness. Genitalia: not examined PULSES: 2+ and symmetric SKIN:  Normal hydration no rash or ulceration CNS: Cranial nerves 2-12 grossly intact no focal lateralizing neurologic deficit.  Speech is fluent; uvula elevated with phonation, facial symmetry and tongue midline. DTR are normal bilaterally, cerebella exam is intact, barbinski is negative and strengths are equaled bilaterally.  No sensory loss.   Labs on Admission:  Basic Metabolic Panel:  Recent Labs Lab 08/19/15 1705 08/20/15 0500  NA 141 139  K 4.1 3.8  CL 104 106  CO2 29 26  GLUCOSE 191* 196*  BUN 23* 26*  CREATININE 1.98* 2.02*  CALCIUM 9.1 8.8*   Liver Function Tests:  Recent Labs Lab 08/19/15 1705  AST 17  ALT 16  ALKPHOS 52  BILITOT 0.5  PROT 7.0  ALBUMIN 3.8   CBC:  Recent Labs Lab 08/19/15 1705  WBC 7.4  NEUTROABS 4.0  HGB 12.5  HCT 40.1  MCV 75.9*  PLT 231   Cardiac Enzymes:  Recent Labs Lab 08/19/15 1705 08/19/15 2251 08/20/15 0500 08/20/15 1124  TROPONINI 0.04* 0.04* 0.05* 0.05*    CBG:  Recent Labs Lab 08/21/15 1112 08/21/15 1640 08/21/15 2025 08/22/15 0757 08/22/15 1154  GLUCAP 178* 177* 179* 149* 188*     Radiological Exams on Admission: Mr Knee Left  Wo Contrast  08/22/2015   CLINICAL DATA:  Left knee pain after a fall 3 days ago. Limited range of motion.  EXAM: MRI OF THE LEFT KNEE WITHOUT CONTRAST  TECHNIQUE: Multiplanar, multisequence MR imaging of the knee was performed. No intravenous contrast was administered.  COMPARISON:  Radiographs dated 08/20/2015  FINDINGS: There is edema as well as a tiny area of subcutaneous hemorrhage in the subcutaneous fat anterior to the patellar tendon.  MENISCI  Medial meniscus:  Normal.  Lateral meniscus:  Normal.  LIGAMENTS  Cruciates:  Normal.  Collaterals:  Grade 1 sprain of the proximal MCL.  LCL is normal.  CARTILAGE  Patellofemoral: Focal thinning of the articular cartilage of the apex of the patella.  Medial: Focal thinning of the cartilage of the central portion of the femoral condyle.   Lateral:  Normal.  Joint:  Large joint effusion.  Popliteal Fossa: Tiny Baker's cyst. Extensive edema in the subcutaneous soft tissues posterior to the distal femur  Extensor Mechanism: Distal quadriceps tendon and patellar tendon are intact. There is edema in the posterior aspect of the vastus medialis muscle with a partial disruption of the proximal medial patellofemoral  ligament.  Bones:  Normal.  IMPRESSION: 1. Soft tissue contusion anterior to the patellar tendon. 2. Strain of the posterior aspect of the distal vastus medialis muscle, grade 1 sprain of the proximal MCL and partial tear of the adjacent posterior aspect of the medial patellofemoral ligament. 3. Prominent joint effusion with subcutaneous edema around the knee.   Electronically Signed   By: Lorriane Shire M.D.   On: 08/22/2015 10:56   Assessment/Plan Present on Admission:  . Syncope . Chest pain Left knee pain.  PLAN: PLAN: Her syncope work up was negative. She was medically stable for discharge, however, her left knee pain has been severe. Xray showed no Fx, and there is a small superficial laceration. MRI showed contusion, strain, and partial tear of the posterior aspect of the distal vastus medialis musle.  Awaiting further recommendation from ortho for disposition.  Other plans as per orders.  Code Status: FULL CODE>    Ayomide Purdy, MD. [facility] Pager [phone] 7pm to 7am.  08/22/2015, 1:20 PM

## 2015-08-22 NOTE — Care Management (Signed)
Per [facility] Pt received walker in 2015 and is not eligible for another through her medicare. Pt informed and asked if she would be interested in purchasing one OOP. Pt no interested in buying one OOP and said she would look for the one she has in storage.

## 2015-08-23 DIAGNOSIS — I2510 Atherosclerotic heart disease of native coronary artery without angina pectoris: Secondary | ICD-10-CM | POA: Diagnosis not present

## 2015-08-23 DIAGNOSIS — G4730 Sleep apnea, unspecified: Secondary | ICD-10-CM | POA: Diagnosis not present

## 2015-08-23 DIAGNOSIS — R55 Syncope and collapse: Secondary | ICD-10-CM | POA: Diagnosis not present

## 2015-08-23 DIAGNOSIS — D509 Iron deficiency anemia, unspecified: Secondary | ICD-10-CM | POA: Diagnosis not present

## 2015-08-23 DIAGNOSIS — M1990 Unspecified osteoarthritis, unspecified site: Secondary | ICD-10-CM | POA: Diagnosis not present

## 2015-08-23 DIAGNOSIS — E785 Hyperlipidemia, unspecified: Secondary | ICD-10-CM | POA: Diagnosis not present

## 2015-08-23 DIAGNOSIS — M25562 Pain in left knee: Secondary | ICD-10-CM | POA: Diagnosis not present

## 2015-08-23 DIAGNOSIS — R0789 Other chest pain: Secondary | ICD-10-CM | POA: Diagnosis not present

## 2015-08-23 LAB — GLUCOSE, CAPILLARY
GLUCOSE-CAPILLARY: 112 mg/dL — AB (ref 65–99)
GLUCOSE-CAPILLARY: 98 mg/dL (ref 65–99)
Glucose-Capillary: 146 mg/dL — ABNORMAL HIGH (ref 65–99)

## 2015-08-23 MED ORDER — FAMOTIDINE 20 MG PO TABS: 20.0000 mg | ORAL_TABLET | Freq: Every day | ORAL | Status: AC

## 2015-08-23 MED ORDER — NEBIVOLOL HCL 5 MG PO TABS: 5.0000 mg | ORAL_TABLET | Freq: Two times a day (BID) | ORAL | Status: DC

## 2015-08-23 MED ORDER — ROSUVASTATIN CALCIUM 20 MG PO TABS: 20.0000 mg | ORAL_TABLET | Freq: Every evening | ORAL | Status: AC

## 2015-08-23 NOTE — Clinical Social Work Note (Signed)
Clinical Social Work Assessment  Patient Details  Name: Krystal [name] MRN: [id number] Date of Birth: 1936-05-29  Date of referral:  08/23/15               Reason for consult:  Facility Placement                Permission sought to share information with:    Permission granted to share information::     Name::        Agency::     Relationship::     Contact Information:     Housing/Transportation Living arrangements for the past 2 months:  Apartment Source of Information:  Patient Patient Interpreter Needed:  None Criminal Activity/Legal Involvement Pertinent to Current Situation/Hospitalization:  No - Comment as needed Significant Relationships:  Other(Comment) (pt reports she has no one) Lives with:  Self Do you feel safe going back to the place where you live?   (pt would prefer placement, but does not want to pay privately) Need for family participation in patient care:  Yes (Comment)  Care giving concerns:  Pt lives alone and is concerned about ability to return home.   Social Worker assessment / plan:  CSW met with pt along with CM. Pt alert and oriented and known to CSW from previous admission. She states that she has recently moved into an apartment and most of her things are still in storage. Pt reports she has no one who helps her. However, her cousin is at her apartment right now setting things up for pt to return home. PT evaluated pt yesterday and recommend home health vs SNF. Pt did better today with PT. CSW discussed this with pt and provided options of private pay ALF or SNF. Lists provided. Pt reports her income is about $900 a month. CSW discussed applying for Medicaid, but pt was upset that most of her check would go towards placement at ALF. She refuses to consider placement at this time as she reports she has other bills to pay. Pt has a lift chair and wheelchair at home. CM ordering potty chair and home health. Pt was receiving Meals on Wheels at last admission, but  stopped this service. She may consider calling [facility] to receive meals again.   Employment status:  Retired Forensic scientist:  [employer] PT Recommendations:  [place], Home with [place] / Referral to community resources:  [place], Other (Comment Required) (ALF list)  Patient/Family's Response to care:  After hearing options, pt chooses to return home with home health.   Patient/Family's Understanding of and Emotional Response to Diagnosis, Current Treatment, and Prognosis:  Pt aware of admission diagnosis and plan to d/c today. She is frustrated that her insurance will not cover placement and that she would have to apply for Medicaid in order to consider going.   Emotional Assessment Appearance:  Appears stated age Attitude/Demeanor/Rapport:  Other (Cooperative) Affect (typically observed):  Anxious Orientation:  Oriented to Self, Oriented to Place, Oriented to  Time, Oriented to Situation Alcohol / Substance use:  Not Applicable Psych involvement (Current and /or in the community):  No (Comment)  Discharge Needs  Concerns to be addressed:  Discharge Planning Concerns Readmission within the last 30 days:  No Current discharge risk:  Lives alone Barriers to Discharge:  No Barriers Identified   Salome Arnt, [place] 08/23/2015, 11:45 AM [phone]
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
## 2015-08-23 NOTE — Care Management Note (Signed)
Case Management Note  Patient Details  Name: DENNY LAVE MRN: [id number] Date of Birth: 10/02/1936  Expected Discharge Date:                  Expected Discharge Plan:  [place]  In-House Referral:  NA  Discharge planning Services  CM Consult  Post Acute Care Choice:  Durable Medical Equipment, Home Health Choice offered to:  Patient  DME Arranged:  Walker rolling DME Agency:  [place] Arranged:  RN, PT, Nurse's Aide [place] Agency:  [facility]  Status of Service:  Completed, signed off  Medicare Important Message Given:    Date Medicare IM Given:    Medicare IM give by:    Date Additional Medicare IM Given:    Additional Medicare Important Message give by:     If discussed at [place] of Stay Meetings, dates discussed:    Additional Comments: Pt being discharged home today with [facility] services. MD concerned about pt's ability to care for herself. CM and CSW discussed with pt her options. Pt given option of going to ALF with [facility] services which would provide her with ADL support around the clock and [facility]. Pt refused due to the fact that she would have to use most of her check to be placed. Pt upset she has no other options but is understanding. Pt does not remember discussion about wheelchair from previsous afternoon. When asked about support pt reports she has no support, a cousin is at her house unpacking her things today but she is not a source of support and "has her own family". Pt ordered 3 in 1 per PT recommendation, [facility] will have 3 in 1 delivered to pt's home. Romualdo Bolk, of [facility], updated on pt's DC plan. No further CM needs noted.  Sherald Barge, RN 08/23/2015, 2:49 PM

## 2015-08-23 NOTE — Progress Notes (Signed)
Physical Therapy Treatment Patient Details Name: Krystal [name] MRN: [id number] DOB: 15-Apr-1936 Today's Date: 08/23/2015    History of Present Illness Krystal [name] is an 79 y.o. female with hx of DJD, known CAD s/p cardiac stents, HTN , GERD, CKD, anxiety, sleep apnea, admitted for syncopal episode and atypical CP. She had troponins in the 0.04 ranges, and is currently pain free. Carotid US showed plaques, but non critical stenosis, and CT scan showed small vessel disease, no acute process. She had pain in her left knee and Xray was negative. She can't walk on it.    PT Comments    Pt was issued a Tscope brace for the LLE by nursing service.  The knee joint of the brace was adjusted correctly to 0-90* but it was not properly placed on her leg.  The brace was refitted to her LE and a great deal of time was spent with the pt educating her in the proper application of the brace.  She practiced the application and appears to understand.  Pt was instructed in basic knee exercises for gentle ROM/strengthening.  She had great difficulty performing any exercise due to pain.  She could not get an effective isometric contraction of the quadriceps or gluteals, she could not actively DF/PF either foot and she was unable to tolerate any left knee flexion in supine.  Functionally, her left knee was able to flex to 90* within the brace.   She had difficulty with transfers in and out of bed, so it has been recommended that she use her lift chair for sleeping.  She is able to ambulate with a walker with less pain due to the knee brace.  Gait is stable.  Follow Up Recommendations  Home health PT     Equipment Recommendations  None recommended by PT    Recommendations for Other Services  none     Precautions / Restrictions Precautions Precautions: Fall Restrictions Weight Bearing Restrictions: No    Mobility  Bed Mobility Overal bed mobility: Needs Assistance Bed Mobility: Sidelying to Sit    Sidelying to sit: Min guard       General bed mobility comments: recommended that she sleep in her lift chair at home to ease the difficulty of transfers  Transfers Overall transfer level: Needs assistance Equipment used: Rolling walker (2 wheeled) Transfers: Sit to/from Stand Sit to Stand: Supervision         General transfer comment: instruction given for hand placement  Ambulation/Gait Ambulation/Gait assistance: Supervision Ambulation Distance (Feet): 80 Feet Assistive device: Rolling walker (2 wheeled) Gait Pattern/deviations: Antalgic   Gait velocity interpretation: <1.8 ft/sec, indicative of risk for recurrent falls General Gait Details: T scope brace on the LLE and pt reports decreased pain in the left knee   Stairs            Wheelchair Mobility    Modified Rankin (Stroke Patients Only)       Balance Overall balance assessment: No apparent balance deficits (not formally assessed)                                  Cognition Arousal/Alertness: Awake/alert Behavior During Therapy: WFL for tasks assessed/performed Overall Cognitive Status: Within Functional Limits for tasks assessed                      Exercises General Exercises - Lower Extremity Ankle Circles/Pumps: Both;10 reps;AAROM;Supine Quad Sets:  AROM;Both;10 reps;Supine Gluteal Sets: AROM;Both;10 reps;Supine Short Arc Quad: AAROM;Both;5 reps;Supine Heel Slides: AAROM;Both;5 reps;Supine    General Comments        Pertinent Vitals/Pain Pain Assessment: 0-10 Pain Score: 8  Pain Location: both LEs, but primarily the left knee Pain Descriptors / Indicators: Aching;Throbbing Pain Intervention(s): Limited activity within patient's tolerance;Monitored during session;Repositioned    Home Living                      Prior Function            PT Goals (current goals can now be found in the care plan section) Progress towards PT goals: Progressing toward  goals    Frequency  7X/week    PT Plan Current plan remains appropriate    Co-evaluation             End of Session Equipment Utilized During Treatment: Gait belt Activity Tolerance: Patient limited by pain Patient left: in bed;with call bell/phone within reach     Time: 1021-1122 PT Time Calculation (min) (ACUTE ONLY): 61 min  Charges:  $Gait Training: 8-22 mins $Therapeutic Exercise: 8-22 mins $Self Care/Home Management: 23-37                    G CodesSable Feil   PT  08/23/2015, 12:25 PM [phone]

## 2015-08-25 DIAGNOSIS — D649 Anemia, unspecified: Secondary | ICD-10-CM | POA: Diagnosis not present

## 2015-08-25 DIAGNOSIS — Z794 Long term (current) use of insulin: Secondary | ICD-10-CM | POA: Diagnosis not present

## 2015-08-25 DIAGNOSIS — K219 Gastro-esophageal reflux disease without esophagitis: Secondary | ICD-10-CM | POA: Diagnosis not present

## 2015-08-25 DIAGNOSIS — S8392XD Sprain of unspecified site of left knee, subsequent encounter: Secondary | ICD-10-CM | POA: Diagnosis not present

## 2015-08-25 DIAGNOSIS — Z7982 Long term (current) use of aspirin: Secondary | ICD-10-CM | POA: Diagnosis not present

## 2015-08-25 DIAGNOSIS — E1121 Type 2 diabetes mellitus with diabetic nephropathy: Secondary | ICD-10-CM | POA: Diagnosis not present

## 2015-08-25 DIAGNOSIS — I2510 Atherosclerotic heart disease of native coronary artery without angina pectoris: Secondary | ICD-10-CM | POA: Diagnosis not present

## 2015-08-25 DIAGNOSIS — N183 Chronic kidney disease, stage 3 (moderate): Secondary | ICD-10-CM | POA: Diagnosis not present

## 2015-08-25 DIAGNOSIS — Z955 Presence of coronary angioplasty implant and graft: Secondary | ICD-10-CM | POA: Diagnosis not present

## 2015-08-25 DIAGNOSIS — G4733 Obstructive sleep apnea (adult) (pediatric): Secondary | ICD-10-CM | POA: Diagnosis not present

## 2015-08-25 DIAGNOSIS — I129 Hypertensive chronic kidney disease with stage 1 through stage 4 chronic kidney disease, or unspecified chronic kidney disease: Secondary | ICD-10-CM | POA: Diagnosis not present

## 2015-08-25 DIAGNOSIS — R55 Syncope and collapse: Secondary | ICD-10-CM | POA: Diagnosis not present

## 2015-08-30 ENCOUNTER — Encounter: Payer: Self-pay | Admitting: Gastroenterology

## 2015-08-30 DIAGNOSIS — I129 Hypertensive chronic kidney disease with stage 1 through stage 4 chronic kidney disease, or unspecified chronic kidney disease: Secondary | ICD-10-CM | POA: Diagnosis not present

## 2015-08-30 DIAGNOSIS — N183 Chronic kidney disease, stage 3 (moderate): Secondary | ICD-10-CM | POA: Diagnosis not present

## 2015-08-30 DIAGNOSIS — S8392XD Sprain of unspecified site of left knee, subsequent encounter: Secondary | ICD-10-CM | POA: Diagnosis not present

## 2015-08-30 DIAGNOSIS — R55 Syncope and collapse: Secondary | ICD-10-CM | POA: Diagnosis not present

## 2015-08-30 DIAGNOSIS — I2510 Atherosclerotic heart disease of native coronary artery without angina pectoris: Secondary | ICD-10-CM | POA: Diagnosis not present

## 2015-08-30 DIAGNOSIS — E1121 Type 2 diabetes mellitus with diabetic nephropathy: Secondary | ICD-10-CM | POA: Diagnosis not present

## 2015-08-31 DIAGNOSIS — S8392XD Sprain of unspecified site of left knee, subsequent encounter: Secondary | ICD-10-CM | POA: Diagnosis not present

## 2015-08-31 DIAGNOSIS — R55 Syncope and collapse: Secondary | ICD-10-CM | POA: Diagnosis not present

## 2015-08-31 DIAGNOSIS — E1121 Type 2 diabetes mellitus with diabetic nephropathy: Secondary | ICD-10-CM | POA: Diagnosis not present

## 2015-08-31 DIAGNOSIS — N183 Chronic kidney disease, stage 3 (moderate): Secondary | ICD-10-CM | POA: Diagnosis not present

## 2015-08-31 DIAGNOSIS — I2510 Atherosclerotic heart disease of native coronary artery without angina pectoris: Secondary | ICD-10-CM | POA: Diagnosis not present

## 2015-08-31 DIAGNOSIS — I129 Hypertensive chronic kidney disease with stage 1 through stage 4 chronic kidney disease, or unspecified chronic kidney disease: Secondary | ICD-10-CM | POA: Diagnosis not present

## 2015-09-01 ENCOUNTER — Ambulatory Visit (INDEPENDENT_AMBULATORY_CARE_PROVIDER_SITE_OTHER): Payer: Medicare Other | Admitting: Orthopedic Surgery

## 2015-09-01 VITALS — BP 88/73 | Ht 67.0 in | Wt 197.9 lb

## 2015-09-01 DIAGNOSIS — S8002XD Contusion of left knee, subsequent encounter: Secondary | ICD-10-CM | POA: Diagnosis not present

## 2015-09-01 DIAGNOSIS — S8012XD Contusion of left lower leg, subsequent encounter: Secondary | ICD-10-CM | POA: Diagnosis not present

## 2015-09-01 DIAGNOSIS — S76112D Strain of left quadriceps muscle, fascia and tendon, subsequent encounter: Secondary | ICD-10-CM | POA: Diagnosis not present

## 2015-09-01 DIAGNOSIS — S83412D Sprain of medial collateral ligament of left knee, subsequent encounter: Secondary | ICD-10-CM

## 2015-09-01 DIAGNOSIS — I2510 Atherosclerotic heart disease of native coronary artery without angina pectoris: Secondary | ICD-10-CM

## 2015-09-01 NOTE — Progress Notes (Signed)
Consult follow-up from the hospital 79 year old female became dizzy in the post office collapse was worked up in the hospital she injured her left knee we got an MRI of that and it showed that she had a medial collateral ligament injury of the left knee and a patellofemoral ligament partial tear she was placed in a brace and was seen by physical therapy and was supposed to get up post discharge follow-up she did not get  Injury was September 30  She still has medial and peripatellar pain which is constant 9 out of 10 requires hydrocodone for relief associated with aching radiating and stiffness  View of systems she's had back surgery by Dr. Cyndy Freeze in 2011 she does have some back pain and hip pain today  Review of systems shortness of breath chest pain seasonal and food allergies would lightheadedness and dizziness  Today's exam shows exquisite tenderness around the knee joint medial collateral ligament patellofemoral joint she has painless range of motion of her hip she has lumbar tenderness 7 difficulty walking with a cane vital signs are stable appearance is otherwise normal she is oriented 3 mood pleasant  MCL proximal grade 1 tear VMO injury Contusion to patellar tendon medial patellofemoral ligament injury  Physical therapy for 6 weeks continue brace follow-up 6 weeks

## 2015-09-01 NOTE — Patient Instructions (Signed)
CALL [facility] THERAPY DEPT TO SCHEDULE THERAPY VISITS 

## 2015-09-02 DIAGNOSIS — E1121 Type 2 diabetes mellitus with diabetic nephropathy: Secondary | ICD-10-CM | POA: Diagnosis not present

## 2015-09-02 DIAGNOSIS — I129 Hypertensive chronic kidney disease with stage 1 through stage 4 chronic kidney disease, or unspecified chronic kidney disease: Secondary | ICD-10-CM | POA: Diagnosis not present

## 2015-09-02 DIAGNOSIS — R55 Syncope and collapse: Secondary | ICD-10-CM | POA: Diagnosis not present

## 2015-09-02 DIAGNOSIS — S8392XD Sprain of unspecified site of left knee, subsequent encounter: Secondary | ICD-10-CM | POA: Diagnosis not present

## 2015-09-02 DIAGNOSIS — I2510 Atherosclerotic heart disease of native coronary artery without angina pectoris: Secondary | ICD-10-CM | POA: Diagnosis not present

## 2015-09-02 DIAGNOSIS — N183 Chronic kidney disease, stage 3 (moderate): Secondary | ICD-10-CM | POA: Diagnosis not present

## 2015-09-03 DIAGNOSIS — S8392XD Sprain of unspecified site of left knee, subsequent encounter: Secondary | ICD-10-CM | POA: Diagnosis not present

## 2015-09-03 DIAGNOSIS — I2510 Atherosclerotic heart disease of native coronary artery without angina pectoris: Secondary | ICD-10-CM | POA: Diagnosis not present

## 2015-09-03 DIAGNOSIS — E1121 Type 2 diabetes mellitus with diabetic nephropathy: Secondary | ICD-10-CM | POA: Diagnosis not present

## 2015-09-03 DIAGNOSIS — R55 Syncope and collapse: Secondary | ICD-10-CM | POA: Diagnosis not present

## 2015-09-03 DIAGNOSIS — I129 Hypertensive chronic kidney disease with stage 1 through stage 4 chronic kidney disease, or unspecified chronic kidney disease: Secondary | ICD-10-CM | POA: Diagnosis not present

## 2015-09-03 DIAGNOSIS — N183 Chronic kidney disease, stage 3 (moderate): Secondary | ICD-10-CM | POA: Diagnosis not present

## 2015-09-06 DIAGNOSIS — R55 Syncope and collapse: Secondary | ICD-10-CM | POA: Diagnosis not present

## 2015-09-06 DIAGNOSIS — N183 Chronic kidney disease, stage 3 (moderate): Secondary | ICD-10-CM | POA: Diagnosis not present

## 2015-09-06 DIAGNOSIS — I2510 Atherosclerotic heart disease of native coronary artery without angina pectoris: Secondary | ICD-10-CM | POA: Diagnosis not present

## 2015-09-06 DIAGNOSIS — S8392XD Sprain of unspecified site of left knee, subsequent encounter: Secondary | ICD-10-CM | POA: Diagnosis not present

## 2015-09-06 DIAGNOSIS — I129 Hypertensive chronic kidney disease with stage 1 through stage 4 chronic kidney disease, or unspecified chronic kidney disease: Secondary | ICD-10-CM | POA: Diagnosis not present

## 2015-09-06 DIAGNOSIS — E1121 Type 2 diabetes mellitus with diabetic nephropathy: Secondary | ICD-10-CM | POA: Diagnosis not present

## 2015-09-08 DIAGNOSIS — N183 Chronic kidney disease, stage 3 (moderate): Secondary | ICD-10-CM | POA: Diagnosis not present

## 2015-09-08 DIAGNOSIS — R55 Syncope and collapse: Secondary | ICD-10-CM | POA: Diagnosis not present

## 2015-09-08 DIAGNOSIS — I2510 Atherosclerotic heart disease of native coronary artery without angina pectoris: Secondary | ICD-10-CM | POA: Diagnosis not present

## 2015-09-08 DIAGNOSIS — S8392XD Sprain of unspecified site of left knee, subsequent encounter: Secondary | ICD-10-CM | POA: Diagnosis not present

## 2015-09-08 DIAGNOSIS — E1121 Type 2 diabetes mellitus with diabetic nephropathy: Secondary | ICD-10-CM | POA: Diagnosis not present

## 2015-09-08 DIAGNOSIS — I129 Hypertensive chronic kidney disease with stage 1 through stage 4 chronic kidney disease, or unspecified chronic kidney disease: Secondary | ICD-10-CM | POA: Diagnosis not present

## 2015-09-09 ENCOUNTER — Ambulatory Visit: Payer: Medicare Other | Admitting: "Endocrinology

## 2015-09-12 DIAGNOSIS — E1121 Type 2 diabetes mellitus with diabetic nephropathy: Secondary | ICD-10-CM | POA: Diagnosis not present

## 2015-09-12 DIAGNOSIS — R55 Syncope and collapse: Secondary | ICD-10-CM | POA: Diagnosis not present

## 2015-09-12 DIAGNOSIS — N183 Chronic kidney disease, stage 3 (moderate): Secondary | ICD-10-CM | POA: Diagnosis not present

## 2015-09-12 DIAGNOSIS — S8392XD Sprain of unspecified site of left knee, subsequent encounter: Secondary | ICD-10-CM | POA: Diagnosis not present

## 2015-09-12 DIAGNOSIS — I129 Hypertensive chronic kidney disease with stage 1 through stage 4 chronic kidney disease, or unspecified chronic kidney disease: Secondary | ICD-10-CM | POA: Diagnosis not present

## 2015-09-12 DIAGNOSIS — I2510 Atherosclerotic heart disease of native coronary artery without angina pectoris: Secondary | ICD-10-CM | POA: Diagnosis not present

## 2015-09-13 DIAGNOSIS — N183 Chronic kidney disease, stage 3 (moderate): Secondary | ICD-10-CM | POA: Diagnosis not present

## 2015-09-13 DIAGNOSIS — I2510 Atherosclerotic heart disease of native coronary artery without angina pectoris: Secondary | ICD-10-CM | POA: Diagnosis not present

## 2015-09-13 DIAGNOSIS — R55 Syncope and collapse: Secondary | ICD-10-CM | POA: Diagnosis not present

## 2015-09-13 DIAGNOSIS — I129 Hypertensive chronic kidney disease with stage 1 through stage 4 chronic kidney disease, or unspecified chronic kidney disease: Secondary | ICD-10-CM | POA: Diagnosis not present

## 2015-09-13 DIAGNOSIS — S8392XD Sprain of unspecified site of left knee, subsequent encounter: Secondary | ICD-10-CM | POA: Diagnosis not present

## 2015-09-13 DIAGNOSIS — M542 Cervicalgia: Secondary | ICD-10-CM | POA: Diagnosis not present

## 2015-09-13 DIAGNOSIS — E1121 Type 2 diabetes mellitus with diabetic nephropathy: Secondary | ICD-10-CM | POA: Diagnosis not present

## 2015-09-13 DIAGNOSIS — M545 Low back pain: Secondary | ICD-10-CM | POA: Diagnosis not present

## 2015-09-14 DIAGNOSIS — S8392XD Sprain of unspecified site of left knee, subsequent encounter: Secondary | ICD-10-CM | POA: Diagnosis not present

## 2015-09-14 DIAGNOSIS — R55 Syncope and collapse: Secondary | ICD-10-CM | POA: Diagnosis not present

## 2015-09-14 DIAGNOSIS — I129 Hypertensive chronic kidney disease with stage 1 through stage 4 chronic kidney disease, or unspecified chronic kidney disease: Secondary | ICD-10-CM | POA: Diagnosis not present

## 2015-09-14 DIAGNOSIS — E1121 Type 2 diabetes mellitus with diabetic nephropathy: Secondary | ICD-10-CM | POA: Diagnosis not present

## 2015-09-14 DIAGNOSIS — N183 Chronic kidney disease, stage 3 (moderate): Secondary | ICD-10-CM | POA: Diagnosis not present

## 2015-09-14 DIAGNOSIS — I2510 Atherosclerotic heart disease of native coronary artery without angina pectoris: Secondary | ICD-10-CM | POA: Diagnosis not present

## 2015-09-15 DIAGNOSIS — E1121 Type 2 diabetes mellitus with diabetic nephropathy: Secondary | ICD-10-CM | POA: Diagnosis not present

## 2015-09-15 DIAGNOSIS — R55 Syncope and collapse: Secondary | ICD-10-CM | POA: Diagnosis not present

## 2015-09-15 DIAGNOSIS — N183 Chronic kidney disease, stage 3 (moderate): Secondary | ICD-10-CM | POA: Diagnosis not present

## 2015-09-15 DIAGNOSIS — S8392XD Sprain of unspecified site of left knee, subsequent encounter: Secondary | ICD-10-CM | POA: Diagnosis not present

## 2015-09-15 DIAGNOSIS — I129 Hypertensive chronic kidney disease with stage 1 through stage 4 chronic kidney disease, or unspecified chronic kidney disease: Secondary | ICD-10-CM | POA: Diagnosis not present

## 2015-09-15 DIAGNOSIS — I2510 Atherosclerotic heart disease of native coronary artery without angina pectoris: Secondary | ICD-10-CM | POA: Diagnosis not present

## 2015-09-19 DIAGNOSIS — N183 Chronic kidney disease, stage 3 (moderate): Secondary | ICD-10-CM | POA: Diagnosis not present

## 2015-09-19 DIAGNOSIS — I129 Hypertensive chronic kidney disease with stage 1 through stage 4 chronic kidney disease, or unspecified chronic kidney disease: Secondary | ICD-10-CM | POA: Diagnosis not present

## 2015-09-19 DIAGNOSIS — E1121 Type 2 diabetes mellitus with diabetic nephropathy: Secondary | ICD-10-CM | POA: Diagnosis not present

## 2015-09-19 DIAGNOSIS — I2510 Atherosclerotic heart disease of native coronary artery without angina pectoris: Secondary | ICD-10-CM | POA: Diagnosis not present

## 2015-09-19 DIAGNOSIS — S8392XD Sprain of unspecified site of left knee, subsequent encounter: Secondary | ICD-10-CM | POA: Diagnosis not present

## 2015-09-19 DIAGNOSIS — R55 Syncope and collapse: Secondary | ICD-10-CM | POA: Diagnosis not present

## 2015-09-20 DIAGNOSIS — I2510 Atherosclerotic heart disease of native coronary artery without angina pectoris: Secondary | ICD-10-CM | POA: Diagnosis not present

## 2015-09-20 DIAGNOSIS — N183 Chronic kidney disease, stage 3 (moderate): Secondary | ICD-10-CM | POA: Diagnosis not present

## 2015-09-20 DIAGNOSIS — E1121 Type 2 diabetes mellitus with diabetic nephropathy: Secondary | ICD-10-CM | POA: Diagnosis not present

## 2015-09-20 DIAGNOSIS — S8392XD Sprain of unspecified site of left knee, subsequent encounter: Secondary | ICD-10-CM | POA: Diagnosis not present

## 2015-09-20 DIAGNOSIS — I129 Hypertensive chronic kidney disease with stage 1 through stage 4 chronic kidney disease, or unspecified chronic kidney disease: Secondary | ICD-10-CM | POA: Diagnosis not present

## 2015-09-20 DIAGNOSIS — R55 Syncope and collapse: Secondary | ICD-10-CM | POA: Diagnosis not present

## 2015-09-21 DIAGNOSIS — E1121 Type 2 diabetes mellitus with diabetic nephropathy: Secondary | ICD-10-CM | POA: Diagnosis not present

## 2015-09-21 DIAGNOSIS — R55 Syncope and collapse: Secondary | ICD-10-CM | POA: Diagnosis not present

## 2015-09-21 DIAGNOSIS — I129 Hypertensive chronic kidney disease with stage 1 through stage 4 chronic kidney disease, or unspecified chronic kidney disease: Secondary | ICD-10-CM | POA: Diagnosis not present

## 2015-09-21 DIAGNOSIS — I2510 Atherosclerotic heart disease of native coronary artery without angina pectoris: Secondary | ICD-10-CM | POA: Diagnosis not present

## 2015-09-21 DIAGNOSIS — S8392XD Sprain of unspecified site of left knee, subsequent encounter: Secondary | ICD-10-CM | POA: Diagnosis not present

## 2015-09-21 DIAGNOSIS — N183 Chronic kidney disease, stage 3 (moderate): Secondary | ICD-10-CM | POA: Diagnosis not present

## 2015-09-22 DIAGNOSIS — R55 Syncope and collapse: Secondary | ICD-10-CM | POA: Diagnosis not present

## 2015-09-22 DIAGNOSIS — I2510 Atherosclerotic heart disease of native coronary artery without angina pectoris: Secondary | ICD-10-CM | POA: Diagnosis not present

## 2015-09-22 DIAGNOSIS — I129 Hypertensive chronic kidney disease with stage 1 through stage 4 chronic kidney disease, or unspecified chronic kidney disease: Secondary | ICD-10-CM | POA: Diagnosis not present

## 2015-09-22 DIAGNOSIS — E1121 Type 2 diabetes mellitus with diabetic nephropathy: Secondary | ICD-10-CM | POA: Diagnosis not present

## 2015-09-22 DIAGNOSIS — N183 Chronic kidney disease, stage 3 (moderate): Secondary | ICD-10-CM | POA: Diagnosis not present

## 2015-09-22 DIAGNOSIS — S8392XD Sprain of unspecified site of left knee, subsequent encounter: Secondary | ICD-10-CM | POA: Diagnosis not present

## 2015-09-23 DIAGNOSIS — N183 Chronic kidney disease, stage 3 (moderate): Secondary | ICD-10-CM | POA: Diagnosis not present

## 2015-09-23 DIAGNOSIS — E1121 Type 2 diabetes mellitus with diabetic nephropathy: Secondary | ICD-10-CM | POA: Diagnosis not present

## 2015-09-23 DIAGNOSIS — I129 Hypertensive chronic kidney disease with stage 1 through stage 4 chronic kidney disease, or unspecified chronic kidney disease: Secondary | ICD-10-CM | POA: Diagnosis not present

## 2015-09-23 DIAGNOSIS — I2510 Atherosclerotic heart disease of native coronary artery without angina pectoris: Secondary | ICD-10-CM | POA: Diagnosis not present

## 2015-09-23 DIAGNOSIS — S8392XD Sprain of unspecified site of left knee, subsequent encounter: Secondary | ICD-10-CM | POA: Diagnosis not present

## 2015-09-23 DIAGNOSIS — R55 Syncope and collapse: Secondary | ICD-10-CM | POA: Diagnosis not present

## 2015-09-26 DIAGNOSIS — I129 Hypertensive chronic kidney disease with stage 1 through stage 4 chronic kidney disease, or unspecified chronic kidney disease: Secondary | ICD-10-CM | POA: Diagnosis not present

## 2015-09-26 DIAGNOSIS — S8392XD Sprain of unspecified site of left knee, subsequent encounter: Secondary | ICD-10-CM | POA: Diagnosis not present

## 2015-09-26 DIAGNOSIS — E1121 Type 2 diabetes mellitus with diabetic nephropathy: Secondary | ICD-10-CM | POA: Diagnosis not present

## 2015-09-26 DIAGNOSIS — R55 Syncope and collapse: Secondary | ICD-10-CM | POA: Diagnosis not present

## 2015-09-26 DIAGNOSIS — I2510 Atherosclerotic heart disease of native coronary artery without angina pectoris: Secondary | ICD-10-CM | POA: Diagnosis not present

## 2015-09-26 DIAGNOSIS — N183 Chronic kidney disease, stage 3 (moderate): Secondary | ICD-10-CM | POA: Diagnosis not present

## 2015-09-27 DIAGNOSIS — N183 Chronic kidney disease, stage 3 (moderate): Secondary | ICD-10-CM | POA: Diagnosis not present

## 2015-09-27 DIAGNOSIS — E1121 Type 2 diabetes mellitus with diabetic nephropathy: Secondary | ICD-10-CM | POA: Diagnosis not present

## 2015-09-27 DIAGNOSIS — R55 Syncope and collapse: Secondary | ICD-10-CM | POA: Diagnosis not present

## 2015-09-27 DIAGNOSIS — I2510 Atherosclerotic heart disease of native coronary artery without angina pectoris: Secondary | ICD-10-CM | POA: Diagnosis not present

## 2015-09-27 DIAGNOSIS — I129 Hypertensive chronic kidney disease with stage 1 through stage 4 chronic kidney disease, or unspecified chronic kidney disease: Secondary | ICD-10-CM | POA: Diagnosis not present

## 2015-09-27 DIAGNOSIS — S8392XD Sprain of unspecified site of left knee, subsequent encounter: Secondary | ICD-10-CM | POA: Diagnosis not present

## 2015-09-28 DIAGNOSIS — R55 Syncope and collapse: Secondary | ICD-10-CM | POA: Diagnosis not present

## 2015-09-28 DIAGNOSIS — I129 Hypertensive chronic kidney disease with stage 1 through stage 4 chronic kidney disease, or unspecified chronic kidney disease: Secondary | ICD-10-CM | POA: Diagnosis not present

## 2015-09-28 DIAGNOSIS — N183 Chronic kidney disease, stage 3 (moderate): Secondary | ICD-10-CM | POA: Diagnosis not present

## 2015-09-28 DIAGNOSIS — S8392XD Sprain of unspecified site of left knee, subsequent encounter: Secondary | ICD-10-CM | POA: Diagnosis not present

## 2015-09-28 DIAGNOSIS — E1121 Type 2 diabetes mellitus with diabetic nephropathy: Secondary | ICD-10-CM | POA: Diagnosis not present

## 2015-09-28 DIAGNOSIS — I2510 Atherosclerotic heart disease of native coronary artery without angina pectoris: Secondary | ICD-10-CM | POA: Diagnosis not present

## 2015-09-29 DIAGNOSIS — N183 Chronic kidney disease, stage 3 (moderate): Secondary | ICD-10-CM | POA: Diagnosis not present

## 2015-09-29 DIAGNOSIS — I129 Hypertensive chronic kidney disease with stage 1 through stage 4 chronic kidney disease, or unspecified chronic kidney disease: Secondary | ICD-10-CM | POA: Diagnosis not present

## 2015-09-29 DIAGNOSIS — S8392XD Sprain of unspecified site of left knee, subsequent encounter: Secondary | ICD-10-CM | POA: Diagnosis not present

## 2015-09-29 DIAGNOSIS — J811 Chronic pulmonary edema: Secondary | ICD-10-CM | POA: Diagnosis not present

## 2015-09-29 DIAGNOSIS — E1121 Type 2 diabetes mellitus with diabetic nephropathy: Secondary | ICD-10-CM | POA: Diagnosis not present

## 2015-09-29 DIAGNOSIS — I2510 Atherosclerotic heart disease of native coronary artery without angina pectoris: Secondary | ICD-10-CM | POA: Diagnosis not present

## 2015-09-29 DIAGNOSIS — R55 Syncope and collapse: Secondary | ICD-10-CM | POA: Diagnosis not present

## 2015-10-04 DIAGNOSIS — R55 Syncope and collapse: Secondary | ICD-10-CM | POA: Diagnosis not present

## 2015-10-04 DIAGNOSIS — S8392XD Sprain of unspecified site of left knee, subsequent encounter: Secondary | ICD-10-CM | POA: Diagnosis not present

## 2015-10-04 DIAGNOSIS — E1121 Type 2 diabetes mellitus with diabetic nephropathy: Secondary | ICD-10-CM | POA: Diagnosis not present

## 2015-10-04 DIAGNOSIS — N183 Chronic kidney disease, stage 3 (moderate): Secondary | ICD-10-CM | POA: Diagnosis not present

## 2015-10-04 DIAGNOSIS — I2510 Atherosclerotic heart disease of native coronary artery without angina pectoris: Secondary | ICD-10-CM | POA: Diagnosis not present

## 2015-10-04 DIAGNOSIS — I129 Hypertensive chronic kidney disease with stage 1 through stage 4 chronic kidney disease, or unspecified chronic kidney disease: Secondary | ICD-10-CM | POA: Diagnosis not present

## 2015-10-05 DIAGNOSIS — E782 Mixed hyperlipidemia: Secondary | ICD-10-CM | POA: Diagnosis not present

## 2015-10-05 DIAGNOSIS — E1122 Type 2 diabetes mellitus with diabetic chronic kidney disease: Secondary | ICD-10-CM | POA: Diagnosis not present

## 2015-10-05 DIAGNOSIS — I10 Essential (primary) hypertension: Secondary | ICD-10-CM | POA: Diagnosis not present

## 2015-10-06 DIAGNOSIS — R55 Syncope and collapse: Secondary | ICD-10-CM | POA: Diagnosis not present

## 2015-10-06 DIAGNOSIS — S8392XD Sprain of unspecified site of left knee, subsequent encounter: Secondary | ICD-10-CM | POA: Diagnosis not present

## 2015-10-06 DIAGNOSIS — I129 Hypertensive chronic kidney disease with stage 1 through stage 4 chronic kidney disease, or unspecified chronic kidney disease: Secondary | ICD-10-CM | POA: Diagnosis not present

## 2015-10-06 DIAGNOSIS — E1121 Type 2 diabetes mellitus with diabetic nephropathy: Secondary | ICD-10-CM | POA: Diagnosis not present

## 2015-10-06 DIAGNOSIS — I2510 Atherosclerotic heart disease of native coronary artery without angina pectoris: Secondary | ICD-10-CM | POA: Diagnosis not present

## 2015-10-06 DIAGNOSIS — N183 Chronic kidney disease, stage 3 (moderate): Secondary | ICD-10-CM | POA: Diagnosis not present

## 2015-10-10 DIAGNOSIS — I129 Hypertensive chronic kidney disease with stage 1 through stage 4 chronic kidney disease, or unspecified chronic kidney disease: Secondary | ICD-10-CM | POA: Diagnosis not present

## 2015-10-10 DIAGNOSIS — E1121 Type 2 diabetes mellitus with diabetic nephropathy: Secondary | ICD-10-CM | POA: Diagnosis not present

## 2015-10-10 DIAGNOSIS — N183 Chronic kidney disease, stage 3 (moderate): Secondary | ICD-10-CM | POA: Diagnosis not present

## 2015-10-10 DIAGNOSIS — I2510 Atherosclerotic heart disease of native coronary artery without angina pectoris: Secondary | ICD-10-CM | POA: Diagnosis not present

## 2015-10-10 DIAGNOSIS — S8392XD Sprain of unspecified site of left knee, subsequent encounter: Secondary | ICD-10-CM | POA: Diagnosis not present

## 2015-10-10 DIAGNOSIS — R55 Syncope and collapse: Secondary | ICD-10-CM | POA: Diagnosis not present

## 2015-10-11 DIAGNOSIS — L603 Nail dystrophy: Secondary | ICD-10-CM | POA: Diagnosis not present

## 2015-10-11 DIAGNOSIS — M10072 Idiopathic gout, left ankle and foot: Secondary | ICD-10-CM | POA: Diagnosis not present

## 2015-10-11 DIAGNOSIS — L84 Corns and callosities: Secondary | ICD-10-CM | POA: Diagnosis not present

## 2015-10-11 DIAGNOSIS — E1051 Type 1 diabetes mellitus with diabetic peripheral angiopathy without gangrene: Secondary | ICD-10-CM | POA: Diagnosis not present

## 2015-10-11 DIAGNOSIS — M10071 Idiopathic gout, right ankle and foot: Secondary | ICD-10-CM | POA: Diagnosis not present

## 2015-10-11 DIAGNOSIS — I739 Peripheral vascular disease, unspecified: Secondary | ICD-10-CM | POA: Diagnosis not present

## 2015-10-12 DIAGNOSIS — N183 Chronic kidney disease, stage 3 (moderate): Secondary | ICD-10-CM | POA: Diagnosis not present

## 2015-10-12 DIAGNOSIS — I2510 Atherosclerotic heart disease of native coronary artery without angina pectoris: Secondary | ICD-10-CM | POA: Diagnosis not present

## 2015-10-12 DIAGNOSIS — E1121 Type 2 diabetes mellitus with diabetic nephropathy: Secondary | ICD-10-CM | POA: Diagnosis not present

## 2015-10-12 DIAGNOSIS — R55 Syncope and collapse: Secondary | ICD-10-CM | POA: Diagnosis not present

## 2015-10-12 DIAGNOSIS — I129 Hypertensive chronic kidney disease with stage 1 through stage 4 chronic kidney disease, or unspecified chronic kidney disease: Secondary | ICD-10-CM | POA: Diagnosis not present

## 2015-10-12 DIAGNOSIS — S8392XD Sprain of unspecified site of left knee, subsequent encounter: Secondary | ICD-10-CM | POA: Diagnosis not present

## 2015-10-17 DIAGNOSIS — R55 Syncope and collapse: Secondary | ICD-10-CM | POA: Diagnosis not present

## 2015-10-17 DIAGNOSIS — S8392XD Sprain of unspecified site of left knee, subsequent encounter: Secondary | ICD-10-CM | POA: Diagnosis not present

## 2015-10-17 DIAGNOSIS — I2510 Atherosclerotic heart disease of native coronary artery without angina pectoris: Secondary | ICD-10-CM | POA: Diagnosis not present

## 2015-10-17 DIAGNOSIS — I129 Hypertensive chronic kidney disease with stage 1 through stage 4 chronic kidney disease, or unspecified chronic kidney disease: Secondary | ICD-10-CM | POA: Diagnosis not present

## 2015-10-17 DIAGNOSIS — E1121 Type 2 diabetes mellitus with diabetic nephropathy: Secondary | ICD-10-CM | POA: Diagnosis not present

## 2015-10-17 DIAGNOSIS — N183 Chronic kidney disease, stage 3 (moderate): Secondary | ICD-10-CM | POA: Diagnosis not present

## 2015-10-19 DIAGNOSIS — E1121 Type 2 diabetes mellitus with diabetic nephropathy: Secondary | ICD-10-CM | POA: Diagnosis not present

## 2015-10-19 DIAGNOSIS — R55 Syncope and collapse: Secondary | ICD-10-CM | POA: Diagnosis not present

## 2015-10-19 DIAGNOSIS — I2510 Atherosclerotic heart disease of native coronary artery without angina pectoris: Secondary | ICD-10-CM | POA: Diagnosis not present

## 2015-10-19 DIAGNOSIS — I129 Hypertensive chronic kidney disease with stage 1 through stage 4 chronic kidney disease, or unspecified chronic kidney disease: Secondary | ICD-10-CM | POA: Diagnosis not present

## 2015-10-19 DIAGNOSIS — N183 Chronic kidney disease, stage 3 (moderate): Secondary | ICD-10-CM | POA: Diagnosis not present

## 2015-10-19 DIAGNOSIS — S8392XD Sprain of unspecified site of left knee, subsequent encounter: Secondary | ICD-10-CM | POA: Diagnosis not present

## 2015-10-20 ENCOUNTER — Ambulatory Visit (INDEPENDENT_AMBULATORY_CARE_PROVIDER_SITE_OTHER): Payer: Medicare Other | Admitting: Orthopedic Surgery

## 2015-10-20 VITALS — BP 195/83 | Ht 67.0 in | Wt 195.0 lb

## 2015-10-20 DIAGNOSIS — S83412D Sprain of medial collateral ligament of left knee, subsequent encounter: Secondary | ICD-10-CM

## 2015-10-20 DIAGNOSIS — M170 Bilateral primary osteoarthritis of knee: Secondary | ICD-10-CM | POA: Diagnosis not present

## 2015-10-20 DIAGNOSIS — I2510 Atherosclerotic heart disease of native coronary artery without angina pectoris: Secondary | ICD-10-CM

## 2015-10-20 NOTE — Progress Notes (Signed)
Follow up   Patient ID: Krystal [name], female   DOB: [birth year], 79 y.o.   MRN: [id number]  Chief Complaint  Patient presents with  . Follow-up    6 week recheck on left knee after PT at [facility].    HPI Krystal [name] is a 79 y.o. female.  Presents after treatment for MCL injury after physical therapy and bracing. Complains of bilateral medial knee pain   Past Medical History  Diagnosis Date  . Sleep apnea     CPAP  . Myalgia   . Palpitations   . Microcytic anemia     History of occult blood in stool  . DJD (degenerative joint disease)   . CAD (coronary artery disease) 2009    5 stents- #3 in RCA, #1 each in LAD and AVG  . Osteoarthritis   . Essential hypertension   . Hyperlipidemia   . GERD (gastroesophageal reflux disease)   . Allergic rhinitis   . Personal history of colonic polyps   . HOH (hard of hearing)   . MI (myocardial infarction) 1998  . Glaucoma   . Diastolic dysfunction, left ventricle   . CKD (chronic kidney disease) stage 3, GFR 30-59 ml/min   . Emphysema lung     2L [place] continuously  . Complete heart block, transient 2014  . Gout   . Type II diabetes mellitus with nephropathy   . History of uterine cancer     Past Surgical History  Procedure Laterality Date  . Ectopic pregnancy surgery    . Foot surgery      Left and right for callous  . Cervical discectomy      L5 left/hemilminectomy  . Colonoscopy  09/2006    Int hemmorhoids, COMPLICATED BY CARDIOPULMONARY COMPLICATIONS  . Abdominal hysterectomy  04/2010    Uterine cancer,TAHBSO  . Polypectomy  10/22/2011    Internal hemorrhoids/sessile polyp  . Coronary angioplasty  1/99, 1/07, 1/08, 4/09    5 cardiac stents total  . Left and right heart catheterization with coronary angiogram N/A 09/12/2012    Procedure: LEFT AND RIGHT HEART CATHETERIZATION WITH CORONARY ANGIOGRAM;  Surgeon: Sanda Klein, MD;  Location: [facility] CATH LAB;  Service: Cardiovascular;  Laterality: N/A;     Allergies   Allergen Reactions  . Morphine Shortness Of Breath and Swelling  . Penicillins Shortness Of Breath and Swelling  . Shellfish Allergy     Current Outpatient Prescriptions  Medication Sig Dispense Refill  . aspirin 81 MG EC tablet Take 81 mg by mouth every morning.     . budesonide-formoterol (SYMBICORT) 160-4.5 MCG/ACT inhaler Inhale 2 puffs into the lungs 2 (two) times daily.     . calcitRIOL (ROCALTROL) 0.25 MCG capsule Take 0.25 mcg by mouth daily.     . clopidogrel (PLAVIX) 75 MG tablet TAKE 1 TABLET ONCE DAILY. 30 tablet 3  . ezetimibe (ZETIA) 10 MG tablet Take 10 mg by mouth every morning.     . famotidine (PEPCID) 20 MG tablet Take 1 tablet (20 mg total) by mouth daily. 30 tablet 1  . febuxostat (ULORIC) 40 MG tablet Take 40 mg by mouth daily.    . ferrous sulfate 325 (65 FE) MG tablet Take 325 mg by mouth daily.     . Fluticasone-Salmeterol (ADVAIR DISKUS) 250-50 MCG/DOSE AEPB Inhale 1 puff into the lungs 2 (two) times daily.     . isosorbide mononitrate (IMDUR) 60 MG 24 hr tablet Take 60 mg by mouth daily.    Marland Kitchen  Liraglutide (VICTOZA) 18 MG/3ML SOPN Inject 1.8 mLs into the skin daily. As directed    . lisinopril (PRINIVIL,ZESTRIL) 10 MG tablet TAKE (1) TABLET BY MOUTH ONCE DAILY. 30 tablet 0  . nebivolol (BYSTOLIC) 5 MG tablet Take 1 tablet (5 mg total) by mouth 2 (two) times daily. 30 tablet 1  . NITROSTAT 0.4 MG SL tablet PLACE ONE (1) TABLET UNDER TONGUE EVERY 5 MINUTES UP TO (3) DOSES AS NEEDED FOR CHEST PAIN. (Patient taking differently: PLACE ONE (1) TABLET UNDER TONGUE EVERY 5 MINUTES UP TO (3) DOSES AS NEEDED FOR CHEST  used 2 weeks ago) 25 tablet 0  . NOVOLOG FLEXPEN 100 UNIT/ML FlexPen USE 25 UNITS AT BREAKFAST, 20 UNITS AT LUNCH AND 35 UNITS AT SUPPER SUB-Q. (Patient taking differently: 8 units before meals) 30 mL 2  . ranolazine (RANEXA) 500 MG 12 hr tablet Take 1 tablet (500 mg total) by mouth 2 (two) times daily. 60 tablet 3  . rosuvastatin (CRESTOR) 20 MG tablet Take 1  tablet (20 mg total) by mouth every evening. 30 tablet 1   No current facility-administered medications for this visit.    Review of Systems Review of Systems  Complains of general fatigue  Physical Exam Blood pressure 195/83, height 5\' 7"  (1.702 m), weight 195 lb (88.451 kg).  Physical Exam  Constitutional: Vital signs are normal. She appears well-developed and well-nourished.  HENT:  Head: Normocephalic.  Cardiovascular: Intact distal pulses.   Musculoskeletal:       Right knee: Tenderness found. Medial joint line tenderness noted.       Left knee: She exhibits bony tenderness. She exhibits normal range of motion, no swelling, no effusion, no ecchymosis, no deformity, no laceration, no erythema, normal alignment, no LCL laxity, normal patellar mobility, normal meniscus and no MCL laxity. Tenderness found. Medial joint line tenderness noted. No lateral joint line, no MCL, no LCL and no patellar tendon tenderness noted.  Neurological: She is alert.  Skin: Skin is warm and dry.  Psychiatric: She has a normal mood and affect. Her behavior is normal. Judgment and thought content normal.    Data Reviewed No new data  Assessment    Healed MCL  Bilateral osteoarthritis knee joint  Patient declined injection follow-up as needed    Plan    Call or return to clinic prn if these symptoms worsen or fail to improve as anticipated.        Arther Abbott 10/20/2015, 10:28 AM

## 2015-10-21 ENCOUNTER — Other Ambulatory Visit: Payer: Self-pay | Admitting: Cardiovascular Disease

## 2015-10-26 ENCOUNTER — Ambulatory Visit (INDEPENDENT_AMBULATORY_CARE_PROVIDER_SITE_OTHER): Payer: Medicare Other | Admitting: "Endocrinology

## 2015-10-26 VITALS — BP 175/90 | HR 82 | Ht 67.0 in | Wt 200.0 lb

## 2015-10-26 DIAGNOSIS — I2510 Atherosclerotic heart disease of native coronary artery without angina pectoris: Secondary | ICD-10-CM | POA: Diagnosis not present

## 2015-10-26 DIAGNOSIS — E1165 Type 2 diabetes mellitus with hyperglycemia: Secondary | ICD-10-CM

## 2015-10-26 DIAGNOSIS — E785 Hyperlipidemia, unspecified: Secondary | ICD-10-CM | POA: Diagnosis not present

## 2015-10-26 DIAGNOSIS — I10 Essential (primary) hypertension: Secondary | ICD-10-CM

## 2015-10-26 DIAGNOSIS — E1169 Type 2 diabetes mellitus with other specified complication: Secondary | ICD-10-CM | POA: Diagnosis not present

## 2015-10-26 DIAGNOSIS — IMO0002 Reserved for concepts with insufficient information to code with codable children: Secondary | ICD-10-CM

## 2015-10-26 NOTE — Patient Instructions (Signed)

## 2015-10-26 NOTE — Progress Notes (Signed)
Subjective:    Patient ID: Krystal [name], female    DOB: 27-Jan-1936, PCP [name], MD   Past Medical History  Diagnosis Date  . Sleep apnea     CPAP  . Myalgia   . Palpitations   . Microcytic anemia     History of occult blood in stool  . DJD (degenerative joint disease)   . CAD (coronary artery disease) 2009    5 stents- #3 in RCA, #1 each in LAD and AVG  . Osteoarthritis   . Essential hypertension   . Hyperlipidemia   . GERD (gastroesophageal reflux disease)   . Allergic rhinitis   . Personal history of colonic polyps   . HOH (hard of hearing)   . MI (myocardial infarction) 1998  . Glaucoma   . Diastolic dysfunction, left ventricle   . CKD (chronic kidney disease) stage 3, GFR 30-59 ml/min   . Emphysema lung     2L N/C continuously  . Complete heart block, transient 2014  . Gout   . Type II diabetes mellitus with nephropathy   . History of uterine cancer    Past Surgical History  Procedure Laterality Date  . Ectopic pregnancy surgery    . Foot surgery      Left and right for callous  . Cervical discectomy      L5 left/hemilminectomy  . Colonoscopy  09/2006    Int hemmorhoids, COMPLICATED BY CARDIOPULMONARY COMPLICATIONS  . Abdominal hysterectomy  04/2010    Uterine cancer,TAHBSO  . Polypectomy  10/22/2011    Internal hemorrhoids/sessile polyp  . Coronary angioplasty  1/99, 1/07, 1/08, 4/09    5 cardiac stents total  . Left and right heart catheterization with coronary angiogram N/A 09/12/2012    Procedure: LEFT AND RIGHT HEART CATHETERIZATION WITH CORONARY ANGIOGRAM;  Surgeon: Sanda Klein, MD;  Location: [facility] CATH LAB;  Service: Cardiovascular;  Laterality: N/A;   Social History   Social History  . Marital Status: Widowed    Spouse Name: N/A  . Number of Children: 0  . Years of Education: 12   Occupational History  .      retired   Social History Main Topics  . Smoking status: Former Smoker -- 0.40 packs/day for 51 years    Types:  Cigarettes    Start date: 04/22/1955    Quit date: 10/18/2006  . Smokeless tobacco: Never Used     Comment: quit about 4 yrs ago  . Alcohol Use: No  . Drug Use: No  . Sexual Activity: No   Other Topics Concern  . Not on file   Social History Narrative   Consumes 2 cups of caffeine daily   Outpatient Encounter Prescriptions as of 10/26/2015  Medication Sig  . aspirin 81 MG EC tablet Take 81 mg by mouth every morning.   . budesonide-formoterol (SYMBICORT) 160-4.5 MCG/ACT inhaler Inhale 2 puffs into the lungs 2 (two) times daily.   . calcitRIOL (ROCALTROL) 0.25 MCG capsule Take 0.25 mcg by mouth daily.   . clopidogrel (PLAVIX) 75 MG tablet TAKE 1 TABLET ONCE DAILY.  Marland Kitchen ezetimibe (ZETIA) 10 MG tablet Take 10 mg by mouth every morning.   . famotidine (PEPCID) 20 MG tablet Take 1 tablet (20 mg total) by mouth daily.  . febuxostat (ULORIC) 40 MG tablet Take 40 mg by mouth daily.  . ferrous sulfate 325 (65 FE) MG tablet Take 325 mg by mouth daily.   . Fluticasone-Salmeterol (ADVAIR DISKUS) 250-50 MCG/DOSE AEPB Inhale  1 puff into the lungs 2 (two) times daily.   . insulin aspart (NOVOLOG FLEXPEN) 100 UNIT/ML FlexPen Inject 8-14 Units into the skin 3 (three) times daily with meals.  . Insulin Glargine (LANTUS SOLOSTAR) 100 UNIT/ML Solostar Pen Inject 30 Units into the skin at bedtime.  . isosorbide mononitrate (IMDUR) 60 MG 24 hr tablet Take 60 mg by mouth daily.  . Liraglutide (VICTOZA) 18 MG/3ML SOPN Inject 1.8 mg into the skin daily.  Marland Kitchen lisinopril (PRINIVIL,ZESTRIL) 10 MG tablet TAKE (1) TABLET BY MOUTH ONCE DAILY.  . nebivolol (BYSTOLIC) 5 MG tablet Take 1 tablet (5 mg total) by mouth 2 (two) times daily.  Marland Kitchen NITROSTAT 0.4 MG SL tablet PLACE ONE (1) TABLET UNDER TONGUE EVERY 5 MINUTES UP TO (3) DOSES AS NEEDED FOR CHEST PAIN. (Patient taking differently: PLACE ONE (1) TABLET UNDER TONGUE EVERY 5 MINUTES UP TO (3) DOSES AS NEEDED FOR CHEST  used 2 weeks ago)  . ranolazine (RANEXA) 500 MG 12 hr  tablet Take 1 tablet (500 mg total) by mouth 2 (two) times daily.  . rosuvastatin (CRESTOR) 20 MG tablet Take 1 tablet (20 mg total) by mouth every evening.  . [DISCONTINUED] Liraglutide (VICTOZA) 18 MG/3ML SOPN Inject 1.8 mLs into the skin daily. As directed  . [DISCONTINUED] NOVOLOG FLEXPEN 100 UNIT/ML FlexPen USE 25 UNITS AT BREAKFAST, 20 UNITS AT LUNCH AND 35 UNITS AT SUPPER SUB-Q. (Patient not taking: Reported on 10/26/2015)   No facility-administered encounter medications on file as of 10/26/2015.   ALLERGIES: Allergies  Allergen Reactions  . Morphine Shortness Of Breath and Swelling  . Penicillins Shortness Of Breath and Swelling  . Shellfish Allergy    VACCINATION STATUS: Immunization History  Administered Date(s) Administered  . Influenza Whole 09/23/2007  . Influenza,inj,Quad PF,36+ Mos 08/20/2015  . Influenza-Unspecified 09/19/2013  . Pneumococcal Polysaccharide-23 08/20/2015    Diabetes She presents for her follow-up diabetic visit. She has type 2 diabetes mellitus. Onset time: She was diagnosed at approximate age of 43 years. Her disease course has been worsening. There are no hypoglycemic associated symptoms. Pertinent negatives for hypoglycemia include no confusion, headaches, pallor or seizures. Associated symptoms include fatigue, polydipsia and polyuria. Pertinent negatives for diabetes include no chest pain and no polyphagia. There are no hypoglycemic complications. Symptoms are worsening. Diabetic complications include heart disease, nephropathy, peripheral neuropathy and PVD. Risk factors for coronary artery disease include dyslipidemia, diabetes mellitus, hypertension, obesity, sedentary lifestyle and tobacco exposure. Current diabetic treatment includes insulin injections. She is following a generally unhealthy diet. She has had a previous visit with a dietitian. She never participates in exercise. Home blood sugar record trend: He did not bring any meter nor logs despite  my advice for her to test 4 times a day. An ACE inhibitor/angiotensin II receptor blocker is being taken.  Hyperlipidemia This is a chronic problem. The current episode started more than 1 year ago. Exacerbating diseases include diabetes and obesity. Pertinent negatives include no chest pain, myalgias or shortness of breath. Current antihyperlipidemic treatment includes bile acid squestrants. Risk factors for coronary artery disease include diabetes mellitus, dyslipidemia, hypertension and obesity.  Hypertension This is a chronic problem. The current episode started more than 1 year ago. Pertinent negatives include no chest pain, headaches, palpitations or shortness of breath. Risk factors for coronary artery disease include diabetes mellitus, dyslipidemia, obesity, sedentary lifestyle and smoking/tobacco exposure. Past treatments include ACE inhibitors. Hypertensive end-organ damage includes kidney disease, CAD/MI and PVD.     Review of Systems  Constitutional: Positive for fatigue. Negative for unexpected weight change.  HENT: Negative for trouble swallowing and voice change.   Eyes: Negative for visual disturbance.  Respiratory: Negative for cough, shortness of breath and wheezing.   Cardiovascular: Negative for chest pain, palpitations and leg swelling.  Gastrointestinal: Negative for nausea, vomiting and diarrhea.  Endocrine: Positive for polydipsia and polyuria. Negative for cold intolerance, heat intolerance and polyphagia.  Musculoskeletal: Negative for myalgias and arthralgias.  Skin: Negative for color change, pallor, rash and wound.  Neurological: Negative for seizures and headaches.  Psychiatric/Behavioral: Negative for suicidal ideas and confusion.    Objective:    BP 175/90 mmHg  Pulse 82  Ht 5\' 7"  (1.702 m)  Wt 200 lb (90.719 kg)  BMI 31.32 kg/m2  SpO2 95%  Wt Readings from Last 3 Encounters:  10/26/15 200 lb (90.719 kg)  10/20/15 195 lb (88.451 kg)  09/01/15 197 lb  14.4 oz (89.767 kg)    Physical Exam  Constitutional: She is oriented to person, place, and time. She appears well-developed.  HENT:  Head: Normocephalic and atraumatic.  Eyes: EOM are normal.  Neck: Normal range of motion. Neck supple. No tracheal deviation present. No thyromegaly present.  Cardiovascular: Normal rate and regular rhythm.   Pulmonary/Chest: Effort normal and breath sounds normal.  Abdominal: Soft. Bowel sounds are normal. There is no tenderness. There is no guarding.  Musculoskeletal: Normal range of motion. She exhibits no edema.  Neurological: She is alert and oriented to person, place, and time. She has normal reflexes. No cranial nerve deficit. Coordination normal.  Skin: Skin is warm and dry. No rash noted. No erythema. No pallor.  Psychiatric:  She is significantly reluctant with possible cognitive deficit.     Complete Blood Count (Most recent): Lab Results  Component Value Date   WBC 7.4 08/19/2015   HGB 12.5 08/19/2015   HCT 40.1 08/19/2015   MCV 75.9* 08/19/2015   PLT 231 08/19/2015   Chemistry (most recent): Lab Results  Component Value Date   NA 139 08/20/2015   K 3.8 08/20/2015   CL 106 08/20/2015   CO2 26 08/20/2015   BUN 26* 08/20/2015   CREATININE 2.02* 08/20/2015   Diabetic Labs (most recent): Lab Results  Component Value Date   HGBA1C 10.7* 03/05/2014   HGBA1C 11.5* 12/18/2013   HGBA1C 11.6* 08/13/2013   Lipid profile (most recent): Lab Results  Component Value Date   TRIG 186* 12/18/2013   CHOL 191 12/18/2013     Assessment & Plan:   1. Uncontrolled type 2 diabetes mellitus with other specified complication ([place])  - Her  diabetes is  complicated by coronary artery disease, chronic kidney disease, peripheral neuropathy, and peripheral arterial disease and patient remains at a high risk for more acute and chronic complications of diabetes which include CAD, CVA, CKD, retinopathy, and neuropathy. These are all discussed in  detail with the patient.  Patient came with out her glucose profile nor her meter, and her recent A1c is little changed at 10.7%. Recent labs reviewed.   - I have re-counseled the patient on diet management  by adopting a carbohydrate restricted / protein rich  Diet.  - Suggestion is made for patient to avoid simple carbohydrates   from their diet including Cakes , Desserts, Ice Cream,  Soda (  diet and regular) , Sweet Tea , Candies,  Chips, Cookies, Artificial Sweeteners,   and "Sugar-free" Products .  This will help patient to have stable blood glucose profile and  potentially avoid unintended  Weight gain.  - Patient is advised to stick to a routine mealtimes to eat 3 meals  a day and avoid unnecessary snacks ( to snack only to correct hypoglycemia).  - The patient  has been  scheduled with Jearld Fenton, RDN, CDE for individualized DM education.  - I have approached patient with the following individualized plan to manage diabetes and patient agrees.  -Mrs. Shockley's care is becoming concerning and she may not be able to do the intensive insulin therapy. -She may need placement in a skilled care facility. -Based on her A1c she would still need basal bolus insulin however she is at high risk of insulin mixup and error.  -One other option would be to switch her insulin to premixed type of insulin to use twice a day.  -I advised her to bring all of insulin supplies she has at home including Victoza before I prescribed premixed kind of insulin to avoid insulin mixup. - In the meantime I advised her to continue basal insulin Lantus 30 units qhs, Novolog  8 units TIDAC for premeal BG between 90-150 plus ssi associated with strict monitoring of BG AC and HS.   -Adjustment parameters for hypo and hyperglycemia were given in a written document to patient. -Patient is encouraged to call clinic for blood glucose levels less than 70 or above 300 mg /dl. -She will be taken off of Victoza next  visit.  - Patient will be considered for incretin therapy as appropriate next visit. - Patient specific target  for A1c; LDL, HDL, Triglycerides, and  Waist Circumference were discussed in detail.  2) BP/HTN: Uncontrolled, patient could not confirm if she is consistent taking her blood pressure medications. I advised her to resume her current medications including ACEI/ARB. 3) Lipids/HPL:  continue ezetimibe, continue Crestor 20 mg by mouth daily at bedtime.   4)  Weight/Diet: CDE consult in progress, exercise, and carbohydrates information provided.  5) Chronic Care/Health Maintenance:  -Patient  on ACEI/ARB and Statin medications and encouraged to continue to follow up with Ophthalmology, Podiatrist at least yearly or according to recommendations, and advised to  stay away from smoking. I have recommended yearly flu vaccine and pneumonia vaccination at least every 5 years; moderate intensity exercise for up to 150 minutes weekly; and  sleep for at least 7 hours a day.  - 25 minutes of time was spent on the care of this patient , 50% of which was applied for counseling on diabetes complications and their preventions.  - I advised patient to maintain close follow up with [name], MD for primary care needs.  Patient is asked to bring meter and  blood glucose logs during their next visit.   Follow up plan: -Return in about 1 week (around 11/02/2015) for diabetes, high blood pressure, high cholesterol, follow up with meter and logs- no labs.  Glade Lloyd, MD Phone: [phone]  Fax: [phone]   10/26/2015, 7:58 PM

## 2015-11-08 ENCOUNTER — Encounter (HOSPITAL_COMMUNITY): Payer: Self-pay | Admitting: Emergency Medicine

## 2015-11-08 ENCOUNTER — Inpatient Hospital Stay (HOSPITAL_COMMUNITY)
Admission: EM | Admit: 2015-11-08 | Discharge: 2015-11-10 | DRG: 291 | Disposition: A | Discharge status: Home / Self Care | Payer: Medicare Other | Attending: Family Medicine | Admitting: Family Medicine

## 2015-11-08 ENCOUNTER — Emergency Department (HOSPITAL_COMMUNITY): Payer: Medicare Other

## 2015-11-08 DIAGNOSIS — Z7982 Long term (current) use of aspirin: Secondary | ICD-10-CM | POA: Diagnosis not present

## 2015-11-08 DIAGNOSIS — J449 Chronic obstructive pulmonary disease, unspecified: Secondary | ICD-10-CM | POA: Diagnosis present

## 2015-11-08 DIAGNOSIS — I2510 Atherosclerotic heart disease of native coronary artery without angina pectoris: Secondary | ICD-10-CM | POA: Diagnosis present

## 2015-11-08 DIAGNOSIS — I169 Hypertensive crisis, unspecified: Secondary | ICD-10-CM | POA: Diagnosis not present

## 2015-11-08 DIAGNOSIS — H9190 Unspecified hearing loss, unspecified ear: Secondary | ICD-10-CM | POA: Diagnosis present

## 2015-11-08 DIAGNOSIS — N183 Chronic kidney disease, stage 3 unspecified: Secondary | ICD-10-CM

## 2015-11-08 DIAGNOSIS — J42 Unspecified chronic bronchitis: Secondary | ICD-10-CM | POA: Diagnosis present

## 2015-11-08 DIAGNOSIS — E1165 Type 2 diabetes mellitus with hyperglycemia: Secondary | ICD-10-CM | POA: Diagnosis present

## 2015-11-08 DIAGNOSIS — E782 Mixed hyperlipidemia: Secondary | ICD-10-CM | POA: Diagnosis present

## 2015-11-08 DIAGNOSIS — Z955 Presence of coronary angioplasty implant and graft: Secondary | ICD-10-CM | POA: Diagnosis not present

## 2015-11-08 DIAGNOSIS — D631 Anemia in chronic kidney disease: Secondary | ICD-10-CM | POA: Diagnosis present

## 2015-11-08 DIAGNOSIS — I252 Old myocardial infarction: Secondary | ICD-10-CM | POA: Diagnosis not present

## 2015-11-08 DIAGNOSIS — Z86010 Personal history of colonic polyps: Secondary | ICD-10-CM

## 2015-11-08 DIAGNOSIS — I130 Hypertensive heart and chronic kidney disease with heart failure and stage 1 through stage 4 chronic kidney disease, or unspecified chronic kidney disease: Principal | ICD-10-CM | POA: Diagnosis present

## 2015-11-08 DIAGNOSIS — K219 Gastro-esophageal reflux disease without esophagitis: Secondary | ICD-10-CM | POA: Diagnosis present

## 2015-11-08 DIAGNOSIS — E1122 Type 2 diabetes mellitus with diabetic chronic kidney disease: Secondary | ICD-10-CM | POA: Diagnosis present

## 2015-11-08 DIAGNOSIS — E1121 Type 2 diabetes mellitus with diabetic nephropathy: Secondary | ICD-10-CM | POA: Diagnosis present

## 2015-11-08 DIAGNOSIS — J9801 Acute bronchospasm: Secondary | ICD-10-CM | POA: Diagnosis present

## 2015-11-08 DIAGNOSIS — J9611 Chronic respiratory failure with hypoxia: Secondary | ICD-10-CM

## 2015-11-08 DIAGNOSIS — I5033 Acute on chronic diastolic (congestive) heart failure: Secondary | ICD-10-CM | POA: Diagnosis present

## 2015-11-08 DIAGNOSIS — G4730 Sleep apnea, unspecified: Secondary | ICD-10-CM | POA: Diagnosis present

## 2015-11-08 DIAGNOSIS — IMO0002 Reserved for concepts with insufficient information to code with codable children: Secondary | ICD-10-CM | POA: Diagnosis present

## 2015-11-08 DIAGNOSIS — Z87891 Personal history of nicotine dependence: Secondary | ICD-10-CM

## 2015-11-08 DIAGNOSIS — R0682 Tachypnea, not elsewhere classified: Secondary | ICD-10-CM | POA: Diagnosis not present

## 2015-11-08 DIAGNOSIS — N184 Chronic kidney disease, stage 4 (severe): Secondary | ICD-10-CM | POA: Diagnosis present

## 2015-11-08 DIAGNOSIS — I110 Hypertensive heart disease with heart failure: Secondary | ICD-10-CM | POA: Diagnosis not present

## 2015-11-08 DIAGNOSIS — Z7902 Long term (current) use of antithrombotics/antiplatelets: Secondary | ICD-10-CM | POA: Diagnosis not present

## 2015-11-08 DIAGNOSIS — R0602 Shortness of breath: Secondary | ICD-10-CM | POA: Diagnosis not present

## 2015-11-08 DIAGNOSIS — I509 Heart failure, unspecified: Secondary | ICD-10-CM | POA: Diagnosis not present

## 2015-11-08 DIAGNOSIS — Z794 Long term (current) use of insulin: Secondary | ICD-10-CM | POA: Diagnosis not present

## 2015-11-08 DIAGNOSIS — I10 Essential (primary) hypertension: Secondary | ICD-10-CM | POA: Diagnosis present

## 2015-11-08 DIAGNOSIS — Z8542 Personal history of malignant neoplasm of other parts of uterus: Secondary | ICD-10-CM

## 2015-11-08 DIAGNOSIS — E785 Hyperlipidemia, unspecified: Secondary | ICD-10-CM | POA: Diagnosis present

## 2015-11-08 DIAGNOSIS — M109 Gout, unspecified: Secondary | ICD-10-CM | POA: Diagnosis present

## 2015-11-08 DIAGNOSIS — H409 Unspecified glaucoma: Secondary | ICD-10-CM | POA: Diagnosis present

## 2015-11-08 DIAGNOSIS — Z9981 Dependence on supplemental oxygen: Secondary | ICD-10-CM

## 2015-11-08 DIAGNOSIS — D649 Anemia, unspecified: Secondary | ICD-10-CM

## 2015-11-08 LAB — COMPREHENSIVE METABOLIC PANEL
ALBUMIN: 3.3 g/dL — AB (ref 3.5–5.0)
ALT: 13 U/L — AB (ref 14–54)
AST: 16 U/L (ref 15–41)
Alkaline Phosphatase: 60 U/L (ref 38–126)
Anion gap: 10 (ref 5–15)
BUN: 25 mg/dL — AB (ref 6–20)
CHLORIDE: 103 mmol/L (ref 101–111)
CO2: 29 mmol/L (ref 22–32)
CREATININE: 1.64 mg/dL — AB (ref 0.44–1.00)
Calcium: 9 mg/dL (ref 8.9–10.3)
GFR calc Af Amer: 34 mL/min — ABNORMAL LOW (ref >60)
GFR calc non Af Amer: 29 mL/min — ABNORMAL LOW (ref >60)
Glucose, Bld: 308 mg/dL — ABNORMAL HIGH (ref 65–99)
Potassium: 3.8 mmol/L (ref 3.5–5.1)
SODIUM: 142 mmol/L (ref 135–145)
Total Bilirubin: 0.6 mg/dL (ref 0.3–1.2)
Total Protein: 6.6 g/dL (ref 6.5–8.1)

## 2015-11-08 LAB — BASIC METABOLIC PANEL
ANION GAP: 14 (ref 5–15)
BUN: 32 mg/dL — ABNORMAL HIGH (ref 6–20)
CHLORIDE: 93 mmol/L — AB (ref 101–111)
CO2: 26 mmol/L (ref 22–32)
CREATININE: 2.2 mg/dL — AB (ref 0.44–1.00)
Calcium: 8.9 mg/dL (ref 8.9–10.3)
GFR calc non Af Amer: 20 mL/min — ABNORMAL LOW (ref >60)
GFR, EST AFRICAN AMERICAN: 24 mL/min — AB (ref >60)
Glucose, Bld: 761 mg/dL (ref 65–99)
POTASSIUM: 4 mmol/L (ref 3.5–5.1)
SODIUM: 133 mmol/L — AB (ref 135–145)

## 2015-11-08 LAB — CBC WITH DIFFERENTIAL/PLATELET
Basophils Absolute: 0 10*3/uL (ref 0.0–0.1)
Basophils Relative: 0 %
Eosinophils Absolute: 0.3 10*3/uL (ref 0.0–0.7)
Eosinophils Relative: 4 %
HCT: 32.6 % — ABNORMAL LOW (ref 36.0–46.0)
Hemoglobin: 9.9 g/dL — ABNORMAL LOW (ref 12.0–15.0)
Lymphocytes Relative: 17 %
Lymphs Abs: 1.5 10*3/uL (ref 0.7–4.0)
MCH: 23 pg — ABNORMAL LOW (ref 26.0–34.0)
MCHC: 30.4 g/dL (ref 30.0–36.0)
MCV: 75.8 fL — ABNORMAL LOW (ref 78.0–100.0)
Monocytes Absolute: 0.8 10*3/uL (ref 0.1–1.0)
Monocytes Relative: 10 %
Neutro Abs: 5.8 10*3/uL (ref 1.7–7.7)
Neutrophils Relative %: 69 %
Platelets: 263 10*3/uL (ref 150–400)
RBC: 4.3 MIL/uL (ref 3.87–5.11)
RDW: 16.4 % — ABNORMAL HIGH (ref 11.5–15.5)
WBC: 8.3 10*3/uL (ref 4.0–10.5)

## 2015-11-08 LAB — GLUCOSE, CAPILLARY
GLUCOSE-CAPILLARY: 359 mg/dL — AB (ref 65–99)
GLUCOSE-CAPILLARY: 253 mg/dL — AB (ref 65–99)
Glucose-Capillary: >600 mg/dL (ref 65–99)
Glucose-Capillary: >600 mg/dL (ref 65–99)
Glucose-Capillary: 498 mg/dL — ABNORMAL HIGH (ref 65–99)
Glucose-Capillary: 453 mg/dL — ABNORMAL HIGH (ref 65–99)
Glucose-Capillary: 390 mg/dL — ABNORMAL HIGH (ref 65–99)
Glucose-Capillary: 174 mg/dL — ABNORMAL HIGH (ref 65–99)

## 2015-11-08 LAB — VITAMIN B12: Vitamin B-12: 392 pg/mL (ref 180–914)

## 2015-11-08 LAB — IRON AND TIBC
IRON: 27 ug/dL — AB (ref 28–170)
SATURATION RATIOS: 9 % — AB (ref 10.4–31.8)
TIBC: 316 ug/dL (ref 250–450)
UIBC: 289 ug/dL

## 2015-11-08 LAB — RETICULOCYTES
RBC.: 4.31 MIL/uL (ref 3.87–5.11)
RETIC CT PCT: 2.8 % (ref 0.4–3.1)
Retic Count, Absolute: 120.7 10*3/uL (ref 19.0–186.0)

## 2015-11-08 LAB — BRAIN NATRIURETIC PEPTIDE: B Natriuretic Peptide: 915 pg/mL — ABNORMAL HIGH (ref 0.0–100.0)

## 2015-11-08 LAB — POC OCCULT BLOOD, ED: Fecal Occult Bld: NEGATIVE

## 2015-11-08 LAB — TROPONIN I
Troponin I: 0.04 ng/mL — ABNORMAL HIGH (ref <0.031)
Troponin I: 0.05 ng/mL — ABNORMAL HIGH (ref <0.031)

## 2015-11-08 LAB — FERRITIN: FERRITIN: 188 ng/mL (ref 11–307)

## 2015-11-08 LAB — FOLATE: Folate: 16.4 ng/mL (ref >5.9)

## 2015-11-08 MED ORDER — ENOXAPARIN SODIUM 40 MG/0.4ML ~~LOC~~ SOLN
40.0000 mg | SUBCUTANEOUS | Status: DC
  Administered 2015-11-08: 40 mg via SUBCUTANEOUS
  Filled 2015-11-08: qty 0.4

## 2015-11-08 MED ORDER — BUDESONIDE-FORMOTEROL FUMARATE 160-4.5 MCG/ACT IN AERO
2.0000 | INHALATION_SPRAY | Freq: Two times a day (BID) | RESPIRATORY_TRACT | Status: DC
  Administered 2015-11-08 – 2015-11-10 (×3): 2 via RESPIRATORY_TRACT
  Filled 2015-11-08: qty 6

## 2015-11-08 MED ORDER — NEBIVOLOL HCL 10 MG PO TABS
5.0000 mg | ORAL_TABLET | Freq: Two times a day (BID) | ORAL | Status: DC
  Administered 2015-11-08 – 2015-11-10 (×5): 5 mg via ORAL
  Filled 2015-11-08 (×11): qty 1

## 2015-11-08 MED ORDER — EZETIMIBE 10 MG PO TABS
10.0000 mg | ORAL_TABLET | Freq: Every morning | ORAL | Status: DC
  Administered 2015-11-08 – 2015-11-10 (×3): 10 mg via ORAL
  Filled 2015-11-08 (×3): qty 1

## 2015-11-08 MED ORDER — INSULIN REGULAR BOLUS VIA INFUSION
0.0000 [IU] | Freq: Three times a day (TID) | INTRAVENOUS | Status: DC
  Filled 2015-11-08: qty 10

## 2015-11-08 MED ORDER — ISOSORBIDE MONONITRATE ER 60 MG PO TB24
60.0000 mg | ORAL_TABLET | Freq: Every day | ORAL | Status: DC
  Administered 2015-11-08 – 2015-11-10 (×3): 60 mg via ORAL
  Filled 2015-11-08 (×3): qty 1

## 2015-11-08 MED ORDER — INSULIN ASPART 100 UNIT/ML ~~LOC~~ SOLN: 0.0000 [IU] | Freq: Three times a day (TID) | SUBCUTANEOUS | Status: DC

## 2015-11-08 MED ORDER — SODIUM CHLORIDE 0.9 % IV SOLN
INTRAVENOUS | Status: DC
  Administered 2015-11-08: 18:00:00 via INTRAVENOUS

## 2015-11-08 MED ORDER — ALBUTEROL (5 MG/ML) CONTINUOUS INHALATION SOLN
10.0000 mg/h | INHALATION_SOLUTION | Freq: Once | RESPIRATORY_TRACT | Status: AC
  Administered 2015-11-08: 10 mg/h via RESPIRATORY_TRACT
  Filled 2015-11-08: qty 20

## 2015-11-08 MED ORDER — SODIUM CHLORIDE 0.9 % IJ SOLN: 3.0000 mL | INTRAMUSCULAR | Status: DC | PRN

## 2015-11-08 MED ORDER — METHYLPREDNISOLONE SODIUM SUCC 125 MG IJ SOLR
125.0000 mg | Freq: Once | INTRAMUSCULAR | Status: AC
  Administered 2015-11-08: 125 mg via INTRAVENOUS
  Filled 2015-11-08: qty 2

## 2015-11-08 MED ORDER — RANOLAZINE ER 500 MG PO TB12
500.0000 mg | ORAL_TABLET | Freq: Two times a day (BID) | ORAL | Status: DC
  Administered 2015-11-08 – 2015-11-10 (×5): 500 mg via ORAL
  Filled 2015-11-08 (×10): qty 1

## 2015-11-08 MED ORDER — FUROSEMIDE 10 MG/ML IJ SOLN
60.0000 mg | Freq: Once | INTRAMUSCULAR | Status: AC
  Administered 2015-11-08: 60 mg via INTRAVENOUS
  Filled 2015-11-08: qty 6

## 2015-11-08 MED ORDER — SODIUM CHLORIDE 0.9 % IV SOLN
INTRAVENOUS | Status: DC
  Administered 2015-11-08: 4.4 [IU]/h via INTRAVENOUS
  Filled 2015-11-08: qty 2.5

## 2015-11-08 MED ORDER — ROSUVASTATIN CALCIUM 20 MG PO TABS
20.0000 mg | ORAL_TABLET | Freq: Every evening | ORAL | Status: DC
  Administered 2015-11-08 – 2015-11-10 (×3): 20 mg via ORAL
  Filled 2015-11-08 (×3): qty 1

## 2015-11-08 MED ORDER — CLOPIDOGREL BISULFATE 75 MG PO TABS
75.0000 mg | ORAL_TABLET | Freq: Every day | ORAL | Status: DC
  Administered 2015-11-08 – 2015-11-10 (×3): 75 mg via ORAL
  Filled 2015-11-08 (×3): qty 1

## 2015-11-08 MED ORDER — INSULIN ASPART 100 UNIT/ML ~~LOC~~ SOLN: 0.0000 [IU] | Freq: Every day | SUBCUTANEOUS | Status: DC

## 2015-11-08 MED ORDER — CALCITRIOL 0.25 MCG PO CAPS
0.2500 ug | ORAL_CAPSULE | Freq: Every day | ORAL | Status: DC
  Administered 2015-11-08 – 2015-11-10 (×3): 0.25 ug via ORAL
  Filled 2015-11-08 (×3): qty 1

## 2015-11-08 MED ORDER — INSULIN GLARGINE 100 UNIT/ML ~~LOC~~ SOLN
10.0000 [IU] | Freq: Every day | SUBCUTANEOUS | Status: DC
  Administered 2015-11-09: 10 [IU] via SUBCUTANEOUS
  Filled 2015-11-08 (×2): qty 0.1

## 2015-11-08 MED ORDER — SODIUM CHLORIDE 0.9 % IJ SOLN
3.0000 mL | Freq: Two times a day (BID) | INTRAMUSCULAR | Status: DC
  Administered 2015-11-08 – 2015-11-10 (×5): 3 mL via INTRAVENOUS

## 2015-11-08 MED ORDER — IPRATROPIUM BROMIDE 0.02 % IN SOLN
0.5000 mg | Freq: Once | RESPIRATORY_TRACT | Status: AC
  Administered 2015-11-08: 0.5 mg via RESPIRATORY_TRACT
  Filled 2015-11-08: qty 2.5

## 2015-11-08 MED ORDER — NITROGLYCERIN 2 % TD OINT
1.0000 [in_us] | TOPICAL_OINTMENT | Freq: Once | TRANSDERMAL | Status: AC
  Administered 2015-11-08: 1 [in_us] via TOPICAL
  Filled 2015-11-08: qty 1

## 2015-11-08 MED ORDER — FEBUXOSTAT 40 MG PO TABS
40.0000 mg | ORAL_TABLET | Freq: Every day | ORAL | Status: DC
  Administered 2015-11-08 – 2015-11-10 (×3): 40 mg via ORAL
  Filled 2015-11-08 (×4): qty 1

## 2015-11-08 MED ORDER — INSULIN GLARGINE 100 UNIT/ML ~~LOC~~ SOLN
30.0000 [IU] | Freq: Every day | SUBCUTANEOUS | Status: DC
  Filled 2015-11-08: qty 0.3

## 2015-11-08 MED ORDER — DEXTROSE 50 % IV SOLN: 25.0000 mL | INTRAVENOUS | Status: DC | PRN

## 2015-11-08 MED ORDER — IPRATROPIUM-ALBUTEROL 0.5-2.5 (3) MG/3ML IN SOLN
3.0000 mL | Freq: Four times a day (QID) | RESPIRATORY_TRACT | Status: DC
  Administered 2015-11-08 – 2015-11-09 (×5): 3 mL via RESPIRATORY_TRACT
  Filled 2015-11-08 (×7): qty 3

## 2015-11-08 MED ORDER — INSULIN ASPART 100 UNIT/ML ~~LOC~~ SOLN
25.0000 [IU] | Freq: Once | SUBCUTANEOUS | Status: AC
  Administered 2015-11-08: 25 [IU] via SUBCUTANEOUS

## 2015-11-08 MED ORDER — FUROSEMIDE 10 MG/ML IJ SOLN
60.0000 mg | Freq: Two times a day (BID) | INTRAMUSCULAR | Status: DC
Start: 2015-11-08 — End: 2015-11-10
  Administered 2015-11-08 – 2015-11-10 (×4): 60 mg via INTRAVENOUS
  Filled 2015-11-08 (×4): qty 6

## 2015-11-08 MED ORDER — FAMOTIDINE 20 MG PO TABS
20.0000 mg | ORAL_TABLET | Freq: Every day | ORAL | Status: DC
  Administered 2015-11-08 – 2015-11-10 (×3): 20 mg via ORAL
  Filled 2015-11-08 (×3): qty 1

## 2015-11-08 MED ORDER — ONDANSETRON HCL 4 MG/2ML IJ SOLN: 4.0000 mg | Freq: Four times a day (QID) | INTRAMUSCULAR | Status: DC | PRN

## 2015-11-08 MED ORDER — INSULIN GLARGINE 100 UNIT/ML SOLOSTAR PEN: 30.0000 [IU] | PEN_INJECTOR | Freq: Every day | SUBCUTANEOUS | Status: DC

## 2015-11-08 MED ORDER — SODIUM CHLORIDE 0.9 % IV SOLN: 250.0000 mL | INTRAVENOUS | Status: DC | PRN

## 2015-11-08 MED ORDER — ASPIRIN 81 MG PO CHEW
81.0000 mg | CHEWABLE_TABLET | Freq: Every morning | ORAL | Status: DC
  Administered 2015-11-08 – 2015-11-10 (×3): 81 mg via ORAL
  Filled 2015-11-08 (×3): qty 1

## 2015-11-08 MED ORDER — LISINOPRIL 10 MG PO TABS
10.0000 mg | ORAL_TABLET | Freq: Every day | ORAL | Status: DC
  Administered 2015-11-08: 10 mg via ORAL
  Filled 2015-11-08: qty 1

## 2015-11-08 MED ORDER — FERROUS SULFATE 325 (65 FE) MG PO TABS
325.0000 mg | ORAL_TABLET | Freq: Every day | ORAL | Status: DC
  Administered 2015-11-08 – 2015-11-10 (×3): 325 mg via ORAL
  Filled 2015-11-08 (×3): qty 1

## 2015-11-08 MED ORDER — NITROGLYCERIN 0.4 MG SL SUBL: 0.4000 mg | SUBLINGUAL_TABLET | SUBLINGUAL | Status: DC | PRN

## 2015-11-08 MED ORDER — INSULIN ASPART 100 UNIT/ML ~~LOC~~ SOLN
0.0000 [IU] | SUBCUTANEOUS | Status: DC
  Administered 2015-11-09: 7 [IU] via SUBCUTANEOUS
  Administered 2015-11-09: 5 [IU] via SUBCUTANEOUS
  Administered 2015-11-09: 1 [IU] via SUBCUTANEOUS
  Administered 2015-11-09: 5 [IU] via SUBCUTANEOUS

## 2015-11-08 MED ORDER — ACETAMINOPHEN 325 MG PO TABS: 650.0000 mg | ORAL_TABLET | ORAL | Status: DC | PRN

## 2015-11-08 MED ORDER — FUROSEMIDE 10 MG/ML IJ SOLN: 60.0000 mg | Freq: Once | INTRAMUSCULAR | Status: DC

## 2015-11-08 MED ORDER — ACETAMINOPHEN 325 MG PO TABS
650.0000 mg | ORAL_TABLET | Freq: Once | ORAL | Status: AC
  Administered 2015-11-08: 650 mg via ORAL
  Filled 2015-11-08: qty 2

## 2015-11-08 NOTE — Progress Notes (Signed)
**Note De-Identified Krystal [name] Obfuscation** Patient removed from 10mg  continuous neb and placed on 2L [place]; patient states that she feels better. SAT 97%, BBS exp. wheeze

## 2015-11-08 NOTE — ED Notes (Signed)
POC occult blood collected. Result was negative. EDP aware. No new orders given at this time.

## 2015-11-08 NOTE — ED Provider Notes (Addendum)
CSN: [id number]     Arrival date & time 11/08/15  0515 History   First MD Initiated Contact with Patient 11/08/15 0526    Chief Complaint  Patient presents with  . Shortness of Breath     (Consider location/radiation/quality/duration/timing/severity/associated sxs/prior Treatment) HPI patient reports she started having difficulty breathing 3 days ago. States it got worse tonight and she felt like she couldn't breathe. She states she has been wheezing. She states she ran out of her inhaler. She denies having a cough but does state she has some mild chest discomfort in the center of her chest. She states the pain feels tight. She states she had similar pain before when she had her heart attack in 1999. She denies any nausea, vomiting, or diaphoresis. She states she felt like her heart was skipping. She reports she's had swelling of her lower extremities but she also attributed that to an acute episode of gout in both of her feet that happened on December 15. She states she did not take any of her medications today because she was feeling too bad. She states she is on oxygen at home and uses 2 L/m by nasal cannula.   PCP Dr French Ana Pulmonary Dr Luan Pulling Cardiology Dr Sallyanne Kuster  Past Medical History  Diagnosis Date  . Sleep apnea     CPAP  . Myalgia   . Palpitations   . Microcytic anemia     History of occult blood in stool  . DJD (degenerative joint disease)   . CAD (coronary artery disease) 2009    5 stents- #3 in RCA, #1 each in LAD and AVG  . Osteoarthritis   . Essential hypertension   . Hyperlipidemia   . GERD (gastroesophageal reflux disease)   . Allergic rhinitis   . Personal history of colonic polyps   . HOH (hard of hearing)   . MI (myocardial infarction) ([place]) 1998  . Glaucoma   . Diastolic dysfunction, left ventricle   . CKD (chronic kidney disease) stage 3, GFR 30-59 ml/min   . Emphysema lung ([facility])     2L N/C continuously  . Complete heart block, transient 2014  . Gout    . Type II diabetes mellitus with nephropathy ([place])   . History of uterine cancer    Past Surgical History  Procedure Laterality Date  . Ectopic pregnancy surgery    . Foot surgery      Left and right for callous  . Cervical discectomy      L5 left/hemilminectomy  . Colonoscopy  09/2006    Int hemmorhoids, COMPLICATED BY CARDIOPULMONARY COMPLICATIONS  . Abdominal hysterectomy  04/2010    Uterine cancer,TAHBSO  . Polypectomy  10/22/2011    Internal hemorrhoids/sessile polyp  . Coronary angioplasty  1/99, 1/07, 1/08, 4/09    5 cardiac stents total  . Left and right heart catheterization with coronary angiogram N/A 09/12/2012    Procedure: LEFT AND RIGHT HEART CATHETERIZATION WITH CORONARY ANGIOGRAM;  Surgeon: Sanda Klein, MD;  Location: [facility] CATH LAB;  Service: Cardiovascular;  Laterality: N/A;   Family History  Problem Relation Age of Onset  . Anesthesia problems Neg Hx   . Hypotension Neg Hx   . Malignant hyperthermia Neg Hx   . Pseudochol deficiency Neg Hx    Social History  Substance Use Topics  . Smoking status: Former Smoker -- 0.40 packs/day for 51 years    Types: Cigarettes    Start date: 04/22/1955    Quit date: 10/18/2006  .  Smokeless tobacco: Never Used     Comment: quit about 4 yrs ago  . Alcohol Use: No   Lives at home Lives alone   OB History    No data available     Review of Systems  All other systems reviewed and are negative.     Allergies  Morphine; Penicillins; and Shellfish allergy  Home Medications   Prior to Admission medications   Medication Sig Start Date End Date Taking? Authorizing Provider  aspirin 81 MG EC tablet Take 81 mg by mouth every morning.     Historical Provider, MD  budesonide-formoterol (SYMBICORT) 160-4.5 MCG/ACT inhaler Inhale 2 puffs into the lungs 2 (two) times daily.     Historical Provider, MD  calcitRIOL (ROCALTROL) 0.25 MCG capsule Take 0.25 mcg by mouth daily.  06/12/13   Historical Provider, MD  clopidogrel  (PLAVIX) 75 MG tablet TAKE 1 TABLET ONCE DAILY. 10/21/15   [name], MD  ezetimibe (ZETIA) 10 MG tablet Take 10 mg by mouth every morning.     Historical Provider, MD  famotidine (PEPCID) 20 MG tablet Take 1 tablet (20 mg total) by mouth daily. 08/23/15   Orvan Falconer, MD  febuxostat (ULORIC) 40 MG tablet Take 40 mg by mouth daily.    Historical Provider, MD  ferrous sulfate 325 (65 FE) MG tablet Take 325 mg by mouth daily.     Historical Provider, MD  Fluticasone-Salmeterol (ADVAIR DISKUS) 250-50 MCG/DOSE AEPB Inhale 1 puff into the lungs 2 (two) times daily.     Sinda Du, MD  insulin aspart (NOVOLOG FLEXPEN) 100 UNIT/ML FlexPen Inject 8-14 Units into the skin 3 (three) times daily with meals.    Historical Provider, MD  Insulin Glargine (LANTUS SOLOSTAR) 100 UNIT/ML Solostar Pen Inject 30 Units into the skin at bedtime.    Historical Provider, MD  isosorbide mononitrate (IMDUR) 60 MG 24 hr tablet Take 60 mg by mouth daily.    Historical Provider, MD  Liraglutide (VICTOZA) 18 MG/3ML SOPN Inject 1.8 mg into the skin daily.    Historical Provider, MD  lisinopril (PRINIVIL,ZESTRIL) 10 MG tablet TAKE (1) TABLET BY MOUTH ONCE DAILY. 10/21/15   [name], MD  nebivolol (BYSTOLIC) 5 MG tablet Take 1 tablet (5 mg total) by mouth 2 (two) times daily. 08/23/15   Orvan Falconer, MD  NITROSTAT 0.4 MG SL tablet PLACE ONE (1) TABLET UNDER TONGUE EVERY 5 MINUTES UP TO (3) DOSES AS NEEDED FOR CHEST PAIN. Patient taking differently: PLACE ONE (1) TABLET UNDER TONGUE EVERY 5 MINUTES UP TO (3) DOSES AS NEEDED FOR CHEST  used 2 weeks ago 03/04/15   Troy Sine, MD  ranolazine (RANEXA) 500 MG 12 hr tablet Take 1 tablet (500 mg total) by mouth 2 (two) times daily. 07/04/15   [name], MD  rosuvastatin (CRESTOR) 20 MG tablet Take 1 tablet (20 mg total) by mouth every evening. 08/23/15   Orvan Falconer, MD   ED Triage Vitals  Enc Vitals Group     BP 11/08/15 0522 207/89 mmHg     Pulse Rate 11/08/15  0522 81     Resp 11/08/15 0522 24     Temp 11/08/15 0522 98.8 F (37.1 C)     Temp Source 11/08/15 0522 Oral     SpO2 11/08/15 0522 100 %     Weight 11/08/15 0522 200 lb (90.719 kg)     Height 11/08/15 0522 5\' 7"  (1.702 m)     Head Cir --  Peak Flow --      Pain Score --      Pain Loc --      Pain Edu? --      Excl. in [place]? --    Vital signs normal except hypertension   Physical Exam  Constitutional: She is oriented to person, place, and time. She appears well-developed and well-nourished.  Non-toxic appearance. She does not appear ill. No distress.  HENT:  Head: Normocephalic and atraumatic.  Right Ear: External ear normal.  Left Ear: External ear normal.  Nose: Nose normal. No mucosal edema or rhinorrhea.  Mouth/Throat: Oropharynx is clear and moist and mucous membranes are normal. No dental abscesses or uvula swelling.  Eyes: Conjunctivae and EOM are normal. Pupils are equal, round, and reactive to light.  Neck: Normal range of motion and full passive range of motion without pain. Neck supple.  Cardiovascular: Normal rate, regular rhythm and normal heart sounds.  Exam reveals no gallop and no friction rub.   No murmur heard. Pulmonary/Chest: Accessory muscle usage present. Tachypnea noted. She is in respiratory distress. She has decreased breath sounds. She has wheezes. She has no rhonchi. She has no rales. She exhibits no tenderness and no crepitus.  Wheezing is audible at times  Abdominal: Soft. Normal appearance and bowel sounds are normal. She exhibits no distension. There is no tenderness. There is no rebound and no guarding.  Musculoskeletal: Normal range of motion. She exhibits edema. She exhibits no tenderness.  Moves all extremities well. She has swelling of both ankles.    Neurological: She is alert and oriented to person, place, and time. She has normal strength. No cranial nerve deficit.  Skin: Skin is warm, dry and intact. No rash noted. No erythema. No pallor.   Psychiatric: She has a normal mood and affect. Her speech is normal and behavior is normal. Her mood appears not anxious.  Nursing note and vitals reviewed.   ED Course  Procedures (including critical care time)  Medications  lisinopril (PRINIVIL,ZESTRIL) tablet 10 mg (not administered)  nebivolol (BYSTOLIC) tablet 5 mg (not administered)  albuterol (PROVENTIL,VENTOLIN) solution continuous neb (10 mg/hr Nebulization Given 11/08/15 0555)  ipratropium (ATROVENT) nebulizer solution 0.5 mg (0.5 mg Nebulization Given 11/08/15 0555)  methylPREDNISolone sodium succinate (SOLU-MEDROL) 125 mg/2 mL injection 125 mg (125 mg Intravenous Given 11/08/15 0555)  nitroGLYCERIN (NITROGLYN) 2 % ointment 1 inch (1 inch Topical Given 11/08/15 0555)  acetaminophen (TYLENOL) tablet 650 mg (650 mg Oral Given 11/08/15 0555)  furosemide (LASIX) injection 60 mg (60 mg Intravenous Given 11/08/15 0640)   Patient had nitroglycerin paste put on her chest for her hypertension. She was given a continuous nebulizer with 10 mg of albuterol and 0.5 mg of Atrovent with Solu-Medrol 125 mg IV.  Recheck 06:40 pt getting her continuous nebulizer, still has rhonchi. Discussed her test results. Her BP has improved to 166/144 with NTG paste. Pt is being given lasix after reviewing her CXR. I did not start antibiotics and she is not coughing and her chest x-ray was more consistent with increased pulmonary vascularity rather than infiltrates. She does not have a fever or leukocytosis.  Patient also noted to have new anemia compared to September when her hemoglobin was 12. Her Hemoccult is negative today.  07:17 Dr Roderic Palau, admit to tele, team 1, restart home BP meds.   Labs Review Results for orders placed or performed during the hospital encounter of 11/08/15  Comprehensive metabolic panel  Result Value Ref Range   Sodium 142  135 - 145 mmol/L   Potassium 3.8 3.5 - 5.1 mmol/L   Chloride 103 101 - 111 mmol/L   CO2 29 22 - 32  mmol/L   Glucose, Bld 308 (H) 65 - 99 mg/dL   BUN 25 (H) 6 - 20 mg/dL   Creatinine, Ser 1.64 (H) 0.44 - 1.00 mg/dL   Calcium 9.0 8.9 - 10.3 mg/dL   Total Protein 6.6 6.5 - 8.1 g/dL   Albumin 3.3 (L) 3.5 - 5.0 g/dL   AST 16 15 - 41 U/L   ALT 13 (L) 14 - 54 U/L   Alkaline Phosphatase 60 38 - 126 U/L   Total Bilirubin 0.6 0.3 - 1.2 mg/dL   GFR calc non Af Amer 29 (L) >60 mL/min   GFR calc Af Amer 34 (L) >60 mL/min   Anion gap 10 5 - 15  Troponin I  Result Value Ref Range   Troponin I 0.04 (H) <0.031 ng/mL  Brain natriuretic peptide  Result Value Ref Range   B Natriuretic Peptide 915.0 (H) 0.0 - 100.0 pg/mL  CBC with Differential  Result Value Ref Range   WBC 8.3 4.0 - 10.5 K/uL   RBC 4.30 3.87 - 5.11 MIL/uL   Hemoglobin 9.9 (L) 12.0 - 15.0 g/dL   HCT 32.6 (L) 36.0 - 46.0 %   MCV 75.8 (L) 78.0 - 100.0 fL   MCH 23.0 (L) 26.0 - 34.0 pg   MCHC 30.4 30.0 - 36.0 g/dL   RDW 16.4 (H) 11.5 - 15.5 %   Platelets 263 150 - 400 K/uL   Neutrophils Relative % 69 %   Neutro Abs 5.8 1.7 - 7.7 K/uL   Lymphocytes Relative 17 %   Lymphs Abs 1.5 0.7 - 4.0 K/uL   Monocytes Relative 10 %   Monocytes Absolute 0.8 0.1 - 1.0 K/uL   Eosinophils Relative 4 %   Eosinophils Absolute 0.3 0.0 - 0.7 K/uL   Basophils Relative 0 %   Basophils Absolute 0.0 0.0 - 0.1 K/uL  POC occult blood, ED RN will collect  Result Value Ref Range   Fecal Occult Bld NEGATIVE NEGATIVE   Laboratory interpretation all normal except stable renal insufficiency, new anemia since her last hemoglobin in September, chronically elevated troponin that is in the same ranges on several priors this year     Imaging Review Dg Chest Port 1 View  11/08/2015  CLINICAL DATA:  Shortness of breath, wheezing, chest pain EXAM: PORTABLE CHEST 1 VIEW COMPARISON:  08/19/2015 FINDINGS: New diffuse interstitial coarsening. Chronic cardiomegaly with stable mild aortic tortuosity. Blood flow appears cephalized but normal vascular pedicle width. No  effusion or pneumothorax. IMPRESSION: 1. New bronchitic or congestive interstitial opacities. 2. Chronic mild cardiomegaly. Electronically Signed   By: Monte Fantasia M.D.   On: 11/08/2015 06:02   I have personally reviewed and evaluated these images and lab results as part of my medical decision-making.   EKG Interpretation   Date/Time:  Tuesday November 08 2015 05:22:11 EST Ventricular Rate:  81 PR Interval:  184 QRS Duration: 87 QT Interval:  395 QTC Calculation: 458 R Axis:   96 Text Interpretation:  Sinus rhythm Atrial premature complexes Consider  right atrial enlargement Right axis deviation Nonspecific T abnormalities,  diffuse leads No significant change since last tracing 19 Aug 2015  Confirmed by Lancer Thurner  MD-I, Revanth Neidig ([id number]) on 11/08/2015 5:46:39 AM      MDM   Final diagnoses:  Hypertensive crisis  Acute congestive heart failure, unspecified congestive  heart failure type ([place])  Bronchospasm  Anemia, unspecified   Plan admission   Rolland Porter, MD, Barbette Or, MD 11/08/15 [id number]  Rolland Porter, MD 11/08/15 [phone]

## 2015-11-08 NOTE — Progress Notes (Signed)
Pt. Transferred to ICU room 11.  Report given to [place], Therapist, sports.

## 2015-11-08 NOTE — Progress Notes (Signed)
CRITICAL VALUE ALERT  Critical value received:  Blood serum glucose = 761  Date of notification:  11/08/15  Time of notification:  1620  Critical value read back:Yes.    Nurse who received alert:  Rosealee Albee  MD notified (1st page):  Dr. Roderic Palau  Time of first page:  1622  MD notified (2nd page):  Time of second page:  Responding MD:  Dr. Roderic Palau  Time MD responded:  [phone]

## 2015-11-08 NOTE — ED Notes (Signed)
Pt continuous neb complete. Pt placed back on 2 liters by RT. Pt tolerating well. nad noted.
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
## 2015-11-08 NOTE — ED Notes (Addendum)
Pt c/o increased sob that started around 2200. Pt is on continuous oxygen 2lpm.

## 2015-11-09 ENCOUNTER — Ambulatory Visit: Payer: Medicare Other | Admitting: "Endocrinology

## 2015-11-09 LAB — GLUCOSE, CAPILLARY
GLUCOSE-CAPILLARY: 302 mg/dL — AB (ref 65–99)
GLUCOSE-CAPILLARY: 254 mg/dL — AB (ref 65–99)
Glucose-Capillary: 149 mg/dL — ABNORMAL HIGH (ref 65–99)
Glucose-Capillary: 256 mg/dL — ABNORMAL HIGH (ref 65–99)
Glucose-Capillary: 277 mg/dL — ABNORMAL HIGH (ref 65–99)
Glucose-Capillary: 295 mg/dL — ABNORMAL HIGH (ref 65–99)

## 2015-11-09 LAB — TROPONIN I
Troponin I: UNDETERMINED ng/mL (ref <0.031)
Troponin I: 0.06 ng/mL — ABNORMAL HIGH (ref <0.031)

## 2015-11-09 LAB — BASIC METABOLIC PANEL
Anion gap: 11 (ref 5–15)
BUN: 36 mg/dL — AB (ref 6–20)
CALCIUM: 9 mg/dL (ref 8.9–10.3)
CO2: 29 mmol/L (ref 22–32)
CREATININE: 2.11 mg/dL — AB (ref 0.44–1.00)
Chloride: 97 mmol/L — ABNORMAL LOW (ref 101–111)
GFR calc non Af Amer: 21 mL/min — ABNORMAL LOW (ref >60)
GFR, EST AFRICAN AMERICAN: 25 mL/min — AB (ref >60)
GLUCOSE: 315 mg/dL — AB (ref 65–99)
Potassium: 3.8 mmol/L (ref 3.5–5.1)
Sodium: 137 mmol/L (ref 135–145)

## 2015-11-09 MED ORDER — BENZONATATE 100 MG PO CAPS: 200.0000 mg | ORAL_CAPSULE | Freq: Three times a day (TID) | ORAL | Status: DC | PRN

## 2015-11-09 MED ORDER — INSULIN ASPART 100 UNIT/ML ~~LOC~~ SOLN
4.0000 [IU] | Freq: Three times a day (TID) | SUBCUTANEOUS | Status: DC
  Administered 2015-11-09 – 2015-11-10 (×4): 4 [IU] via SUBCUTANEOUS

## 2015-11-09 MED ORDER — INSULIN GLARGINE 100 UNIT/ML ~~LOC~~ SOLN
22.0000 [IU] | Freq: Every day | SUBCUTANEOUS | Status: DC
  Administered 2015-11-09: 22 [IU] via SUBCUTANEOUS
  Filled 2015-11-09 (×2): qty 0.22

## 2015-11-09 MED ORDER — AMLODIPINE BESYLATE 5 MG PO TABS
5.0000 mg | ORAL_TABLET | Freq: Every day | ORAL | Status: DC
  Administered 2015-11-09 – 2015-11-10 (×2): 5 mg via ORAL
  Filled 2015-11-09 (×2): qty 1

## 2015-11-09 MED ORDER — ENOXAPARIN SODIUM 30 MG/0.3ML ~~LOC~~ SOLN
30.0000 mg | SUBCUTANEOUS | Status: DC
  Administered 2015-11-09 – 2015-11-10 (×2): 30 mg via SUBCUTANEOUS
  Filled 2015-11-09 (×2): qty 0.3

## 2015-11-09 MED ORDER — INSULIN ASPART 100 UNIT/ML ~~LOC~~ SOLN
4.0000 [IU] | Freq: Three times a day (TID) | SUBCUTANEOUS | Status: DC
  Administered 2015-11-09: 4 [IU] via SUBCUTANEOUS

## 2015-11-09 MED ORDER — BUDESONIDE-FORMOTEROL FUMARATE 160-4.5 MCG/ACT IN AERO
INHALATION_SPRAY | RESPIRATORY_TRACT | Status: AC
  Filled 2015-11-09: qty 6

## 2015-11-09 MED ORDER — HYDRALAZINE HCL 25 MG PO TABS
25.0000 mg | ORAL_TABLET | Freq: Four times a day (QID) | ORAL | Status: DC | PRN
  Administered 2015-11-09: 25 mg via ORAL
  Filled 2015-11-09: qty 1

## 2015-11-09 MED ORDER — INSULIN ASPART 100 UNIT/ML ~~LOC~~ SOLN
0.0000 [IU] | Freq: Three times a day (TID) | SUBCUTANEOUS | Status: DC
  Administered 2015-11-09: 5 [IU] via SUBCUTANEOUS
  Administered 2015-11-10: 3 [IU] via SUBCUTANEOUS
  Administered 2015-11-10 (×2): 5 [IU] via SUBCUTANEOUS

## 2015-11-09 MED ORDER — IPRATROPIUM-ALBUTEROL 0.5-2.5 (3) MG/3ML IN SOLN
3.0000 mL | Freq: Four times a day (QID) | RESPIRATORY_TRACT | Status: DC
  Administered 2015-11-10 (×2): 3 mL via RESPIRATORY_TRACT
  Filled 2015-11-09 (×2): qty 3

## 2015-11-09 NOTE — Care Management Note (Signed)
Case Management Note  Patient Details  Name: Krystal [name] MRN: [id number] Date of Birth: 1936-07-23  Subjective/Objective:                  Pt admitted with uncontrolled hypertension and DM. Pt is from home, lives alone and has poor support system. Pt is ind with ADL's. Pt has home O2 through [facility]. Pt states she needs to have her port. concentrator changed to more Engineer, civil (consulting) (like the one she used to have). Pt has used [facility] services in the past and would like to use them again.   Action/Plan: Pt plans to return home with [facility] services through [facility]. Romualdo Bolk, of [facility], made aware of pt's request for different concentrator and for [facility] needs. Will cont to follow for DC planning.   Expected Discharge Date:    11/11/2015              Expected Discharge Plan:  [place]  In-House Referral:  NA  Discharge planning Services  CM Consult  Post Acute Care Choice:  Home Health Choice offered to:  Patient  DME Arranged:    DME Agency:     HH Arranged:  RN [place] Agency:  [place]  Status of Service:  In process, will continue to follow  Medicare Important Message Given:    Date Medicare IM Given:    Medicare IM give by:    Date Additional Medicare IM Given:    Additional Medicare Important Message give by:     If discussed at [place] of Stay Meetings, dates discussed:    Additional Comments:  Sherald Barge, RN 11/09/2015, 2:27 PM

## 2015-11-09 NOTE — Progress Notes (Signed)
Inpatient Diabetes Program Recommendations  AACE/ADA: New Consensus Statement on Inpatient Glycemic Control (2015)  Target Ranges:  Prepandial:   less than 140 mg/dL      Peak postprandial:   less than 180 mg/dL (1-2 hours)      Critically ill patients:  140 - 180 mg/dL  Results for Krystal [name], Krystal [name] (MRN [id number]) as of 11/09/2015 07:48  Ref. Range 11/08/2015 16:14 11/08/2015 17:53 11/08/2015 19:00 11/08/2015 20:10 11/08/2015 21:14 11/08/2015 22:12 11/08/2015 23:08 11/09/2015 00:15 11/09/2015 05:07 11/09/2015 07:23  Glucose-Capillary Latest Ref Range: 65-99 mg/dL >600 (HH) 498 (H) 453 (H) 390 (H) 359 (H) 253 (H) 174 (H) 149 (H) 277 (H) 256 (H)   Review of Glycemic Control  Diabetes history: DM2 Outpatient Diabetes medications: Lantus 20 units QHS, Novolog 20 units TID with meals, Victoza 1.8 mg daily Current orders for Inpatient glycemic control: Lantus 10 units QHS, Novolog 0-9 units Q4H  Inpatient Diabetes Program Recommendations: Insulin - Basal: Please consider increasing Lantus to 22 units QHS (based on 89 kg x 0.25 units). Insulin - Meal Coverage: If patient is going to eat at least 50% of meals, please consider ordering Novolog 4 units TID with meals for meal coverage (in addition to Novolog correction scale).  Thanks, Barnie Alderman, RN, MSN, CDE Diabetes Coordinator Inpatient Diabetes Program [phone] (Team Pager from [place] to [place]) [phone] ([place] office) [phone] [facility] office) [phone] [facility] office)

## 2015-11-09 NOTE — Progress Notes (Signed)
Pt was incontinent of urine today.  Unable to measure urine output.  Pt gets IV lasix.  If strict intake and output measurements are required, consider placing a foley catheter.

## 2015-11-09 NOTE — Progress Notes (Signed)
PROGRESS NOTE  EMONE ONDER [id number] DOB: 12-Mar-1936 DOA: 11/08/2015 PCP: [name], MD  Summary: 79 y.o. female with HLD, CKD Stage III, chronic diastolic heart failure presented with complaints of shortness of breath and productive cough.  She is on chronic 2L oxygen. While in the ED found to be in hypertensive crisis with mild wheezing. She was admitted for further evaluation and started on diuretics.   Assessment/Plan: 1. Acute on chronic diastolic CHF, improving with IV Lasix. Continue strict I&Os and daily weights. UOP 1020, >-48ml since admission. ECHO from 5/16 shows preserved EF with diastolic dysfunction.  She is not a candidate for ACE-I due to renal dysfunction.  2. Hypertensive crisis, resolved. Remains with HTN, likely chronic poor control. Will continue home regimen, add Norvasc. and Hydralazine PRN. Asymptomatic.  3. Chronic hypoxic respiratory failure related to COPD and CHF. She is chronically on 2L. Will continue supportive treatment.  4. COPD, appears stable. Continue bronchodilators and Symbicort.  5. CKD Stage IV. Renal function at baseline. Willl continue to monitor in the setting of diuresis.  6. DM Type 2, uncontrolled. Glucose 700 on admission, improved with IV Insulin. Transitioned to basal insulin and SSI. No evidence of DKA. DM coordinator consulted, recommends increasing Lantus to 22U and supplementing meals with Novolog 4U. 7. Elevated troponin. Flat. Secondary to CKD, strain. Chronically elevated. No further evaluation. 8. Anemia, possibly related to chronic disease. Hgb is lower then baseline. Stool occult blood negative. Anemia panel reveals Iron of 27. No evidence of bleeding.  9. HLD, stable. Continue statin.   Overall improving.   Continue BB and Lasix. Monitor I&Os.   Add Norvasc.  Repeat BMP and CBC in am. Monitor glucose closely.   Transfer to medical floor.   Code Status: Full DVT Prophylaxis: SCDs Family Communication:  Discussed with patient who understands and has no concerns at this time. Disposition Plan: Transfer to medical bed.   Murray Hodgkins, MD  [facility]  Pager [phone] If 7PM-7AM, please contact night-coverage at [email or web address], password [facility] 11/09/2015, 6:07 AM  LOS: 1 day   Consultants:  Diabetic counselor  Procedures:    Antibiotics:    HPI/Subjective: Has a HA and feels weak, didn't sleep much. Ate breakfast, denies any nausea or vomiting. Breathing is much better.   Objective: Filed Vitals:   11/09/15 0300 11/09/15 0400 11/09/15 0500 11/09/15 0601  BP: 192/83 182/79 173/74   Pulse: 73 69 69   Temp:  97.9 F (36.6 C)    TempSrc:  Oral    Resp: 18 20 21    Height:      Weight:   89.4 kg (197 lb 1.5 oz)   SpO2: 100% 100% 100% 97%    Intake/Output Summary (Last 24 hours) at 11/09/15 0607 Last data filed at 11/09/15 0000  Gross per 24 hour  Intake 555.61 ml  Output   1020 ml  Net -464.39 ml     Filed Weights   11/08/15 0522 11/08/15 0826 11/09/15 0500  Weight: 90.719 kg (200 lb) 90.583 kg (199 lb 11.2 oz) 89.4 kg (197 lb 1.5 oz)    Exam:     VSS, afebrile, not hypoxic  General:  Appears calm and comfortable. Sitting in chair  Eyes: PERRL, normal lids, irises & conjunctiva ENT: grossly normal hearing, lips & tongue Cardiovascular: RRR, no m/r/g. 2+ BLE edema. Telemetry: SR, no arrhythmias  Respiratory: Crackles posteriorally, no w/r.  Normal respiratory effort. Abdomen: soft, ntnd Skin: no rash or induration seen on limited exam Musculoskeletal:  grossly normal tone BUE/BLE Psychiatric: grossly normal mood and affect, speech fluent and appropriate Neurologic: grossly non-focal.  New data reviewed:  Creatinine stable 2.11, BUN 36  CBG 256  Pertinent data since admission:  BNP 915  Troponin 0.04  Hgb 9.9  Pending data:  none  Scheduled Meds: . aspirin  81 mg Oral q morning - 10a  . budesonide-formoterol  2 puff Inhalation BID  .  calcitRIOL  0.25 mcg Oral Daily  . clopidogrel  75 mg Oral Daily  . enoxaparin (LOVENOX) injection  40 mg Subcutaneous Q24H  . ezetimibe  10 mg Oral q morning - 10a  . famotidine  20 mg Oral Daily  . febuxostat  40 mg Oral Daily  . ferrous sulfate  325 mg Oral Daily  . furosemide  60 mg Intravenous BID  . insulin aspart  0-9 Units Subcutaneous 6 times per day  . insulin glargine  10 Units Subcutaneous QHS  . insulin regular  0-10 Units Intravenous TID WC  . ipratropium-albuterol  3 mL Nebulization Q6H  . isosorbide mononitrate  60 mg Oral Daily  . nebivolol  5 mg Oral BID  . ranolazine  500 mg Oral BID  . rosuvastatin  20 mg Oral QPM  . sodium chloride  3 mL Intravenous Q12H   Continuous Infusions: . sodium chloride 10 mL/hr at 11/08/15 2100  . insulin (NOVOLIN-R) infusion Stopped (11/09/15 0000)    Active Problems:   Hyperlipidemia   Essential hypertension   Chronic respiratory failure with hypoxia (HCC)   COPD with chronic bronchitis (HCC)   Type 2 diabetes mellitus with stage 3 chronic kidney disease ([place])   Uncontrolled type 2 diabetes mellitus ([place])   Hypertensive crisis   Acute on chronic diastolic CHF (congestive heart failure) ([place])   Anemia, unspecified   Time spent 20 minutes   By signing my name below, I, Rosalie Doctor attest that this documentation has been prepared under the direction and in the presence of Murray Hodgkins, MD Electronically signed: Rosalie Doctor, Scribe. 11/09/2015 10:45am  I personally performed the services described in this documentation. All medical record entries made by the scribe were at my direction. I have reviewed the chart and agree that the record reflects my personal performance and is accurate and complete. Murray Hodgkins, MD

## 2015-11-09 NOTE — Progress Notes (Signed)
Patient has c/o sinus drainage, paged on call MD, will follow any new orders received and continue to monitor patient.

## 2015-11-10 ENCOUNTER — Encounter: Payer: Medicare Other | Admitting: Adult Health

## 2015-11-10 ENCOUNTER — Other Ambulatory Visit: Payer: Self-pay | Admitting: *Deleted

## 2015-11-10 ENCOUNTER — Encounter: Payer: Self-pay | Admitting: Adult Health

## 2015-11-10 LAB — BASIC METABOLIC PANEL
Anion gap: 7 (ref 5–15)
BUN: 38 mg/dL — AB (ref 6–20)
CALCIUM: 8.7 mg/dL — AB (ref 8.9–10.3)
CO2: 35 mmol/L — AB (ref 22–32)
CREATININE: 2.11 mg/dL — AB (ref 0.44–1.00)
Chloride: 97 mmol/L — ABNORMAL LOW (ref 101–111)
GFR calc Af Amer: 25 mL/min — ABNORMAL LOW (ref >60)
GFR, EST NON AFRICAN AMERICAN: 21 mL/min — AB (ref >60)
GLUCOSE: 279 mg/dL — AB (ref 65–99)
Potassium: 3.6 mmol/L (ref 3.5–5.1)
Sodium: 139 mmol/L (ref 135–145)

## 2015-11-10 LAB — GLUCOSE, CAPILLARY
Glucose-Capillary: 234 mg/dL — ABNORMAL HIGH (ref 65–99)
Glucose-Capillary: 285 mg/dL — ABNORMAL HIGH (ref 65–99)
Glucose-Capillary: 268 mg/dL — ABNORMAL HIGH (ref 65–99)

## 2015-11-10 MED ORDER — POLYETHYLENE GLYCOL 3350 17 G PO PACK: 17.0000 g | PACK | Freq: Every day | ORAL | Status: AC | PRN

## 2015-11-10 MED ORDER — SALINE SPRAY 0.65 % NA SOLN: 1.0000 | NASAL | Status: DC | PRN

## 2015-11-10 MED ORDER — TORSEMIDE 20 MG PO TABS: 20.0000 mg | ORAL_TABLET | Freq: Every day | ORAL | Status: DC

## 2015-11-10 MED ORDER — FLUTICASONE-SALMETEROL 250-50 MCG/DOSE IN AEPB: 1.0000 | INHALATION_SPRAY | Freq: Two times a day (BID) | RESPIRATORY_TRACT | Status: DC

## 2015-11-10 NOTE — Consult Note (Signed)
   [facility] CM Inpatient Consult   11/10/2015  Krystal [name] October 12, 1936 [id number]   Spoke with the patient via telephone to offer [place] Management services as benefit of Medicare. Patient agreeable to services, she gave verbal consent and verbalizes understanding that she will receive post hospital discharge call and will be evaluated for monthly home visits.   Of note, [facility] Care Management services does not replace or interfere with any services that are arranged by inpatient case management or social work.  For additional questions or referrals please contact: [name]. [name], RN, BSN, [facility] Liaison [phone]

## 2015-11-10 NOTE — Evaluation (Signed)
Physical Therapy Evaluation Patient Details Name: Krystal [name] MRN: [id number] DOB: [birth year] Today's Date: 11/10/2015   History of Present Illness  Patient presented with an acute decompensation of chronic diastolic CHF. She was started on IV Lasix with significant improvement and excellent diuresis. ECHO from 5/16 revealed a preserved EF with diastolic dysfunction. Due to her renal dysfunction, she was not a candidate for ACE-I. Initial vitals revealed that the patient was in a hypertensive crisis. It has since resolved however her BP remains slightly elevated, likely due to chronic poor control. She will be discharged on her home medications with plans to maintain close follow up. She has remained asymptomatic. On admission, her glucose was noted to be 700. She had no evidence of DKA. She was started on IV insulin with resolution. Diabetes coordinator consulted and recommended increasing her Lantus to 22U and supplementing meals with 4U of Novolog. Patient encouraged to maintain close follow up. Labs on admission also revealed a hgb of 9.9, possibly related to chronic disease however lower than her baseline. Stool occult blood was negative. Anemia panel was ordered and revealed an Iron of 27. She did not have any evidence of bleeding and will need a repeat CBC within 2 weeks. All other issues remained stable.  Clinical Impression  Pt is not interested in assistive living.  Pt sit to stand and ambulated with mod I with rolling walker.  Pt will benefit from [facility] PT to improve strength and activity tolerance.     Follow Up Recommendations Home health PT    Equipment Recommendations   none    Recommendations for Other Services   OT    Precautions / Restrictions Precautions Precautions: None Restrictions Weight Bearing Restrictions: No      Mobility  Bed Mobility Overal bed mobility: Modified Independent                Transfers Overall transfer level: Modified independent                   Ambulation/Gait Ambulation/Gait assistance: Modified independent (Device/Increase time) Ambulation Distance (Feet): 100 Feet Assistive device: Rolling walker (2 wheeled) Gait Pattern/deviations: WFL(Within Functional Limits)   Gait velocity interpretation: Below normal speed for age/gender              Pertinent Vitals/Pain Pain Assessment: No/denies pain    Home Living Family/patient expects to be discharged to:: Private residence Living Arrangements:  (church memeber comes twice a week to do any shopping that  pt needs ) Available Help at Discharge: Friend(s);Available PRN/intermittently Type of Home: Apartment Home Access: Level entry     Home Layout: One level   Additional Comments: Pt lives alone in a 1st floor apartment, no steps    Prior Function Level of Independence: Independent with assistive device(s)         Comments: uses a cane or walker for most gait, occasionally mobilizes in a w/c within the home...she is independent with all ADLs        Extremity/Trunk Assessment               Lower Extremity Assessment: Generalized weakness         Communication   Communication: No difficulties  Cognition Arousal/Alertness: Awake/alert Behavior During Therapy: WFL for tasks assessed/performed Overall Cognitive Status: Within Functional Limits for tasks assessed  Assessment/Plan    PT Assessment Patient needs continued PT services  PT Diagnosis Generalized weakness   PT Problem List Decreased strength;Decreased activity tolerance  PT Treatment Interventions     PT Goals (Current goals can be found in the Care Plan section)                 End of Session Equipment Utilized During Treatment: Gait belt Activity Tolerance: Patient tolerated treatment well Patient left: in chair;with call bell/phone within reach Nurse Communication: Mobility status         Time:  [id number] PT Time Calculation (min) (ACUTE ONLY): 40 min   Charges:   PT Evaluation $Initial PT Evaluation Tier I: 1 Procedure           .ci 11/10/2015, 11:58 AM

## 2015-11-10 NOTE — Care Management Important Message (Signed)
Important Message  Patient Details  Name: Krystal [name] MRN: [id number] Date of Birth: [birth year]   Medicare Important Message Given:  N/A - LOS <3 / Initial given by admissions    Joylene Draft, RN 11/10/2015, 3:54 PM

## 2015-11-10 NOTE — Progress Notes (Signed)
Cardiology Office Note  ERROR NO show

## 2015-11-10 NOTE — Discharge Summary (Signed)
Physician Discharge Summary  Krystal [name] [id number] DOB: June 20, 1936 DOA: 11/08/2015  PCP: [name], MD  Admit date: 11/08/2015 Discharge date: 11/10/2015  Recommendations for Outpatient Follow-up:  1. followup diastolic CHF, started on Demadex. Consider repeat BMP and will request cardiology f/u 1 week.   Follow-up Information    Follow up with [name].   Contact information:   [address] [place] [address] [phone]       Follow up with [name], MD.   Specialty:  Cardiology   Why:  office will call you with appointment   Contact information:   [place] [address] [phone]        Discharge Diagnoses:  1.  Acute on chronic diastolic CHF. 2. Hypertensive crisis. 3. Chronic hypoxic respiratory failure related to COPD  4. COPD. 5. CKD Stage IV. 6. DM Type 2. 7. Elevated troponin. 8. Anemia of CKD  Discharge Condition: Improved Disposition: Home  Diet recommendation: Heart healthy, diabetic diet  Filed Weights   11/09/15 0500 11/09/15 2020 11/10/15 0535  Weight: 89.4 kg (197 lb 1.5 oz) 88.905 kg (196 lb) 88.905 kg (196 lb)    History of present illness:  86 yow PMH chronic diastolic heart failure presented with SOB productive cough. She is on chronic 2L oxygen. While in the ED found to be in hypertensive crisis with mild wheezing. She was admitted for further evaluation and started on diuretics.   Hospital Course:  Patient presented with an acute decompensation of chronic diastolic CHF. She was started on IV Lasix with significant improvement and excellent diuresis. ECHO from 5/16 showed a preserved EF with diastolic dysfunction. Initial vitals revealed that the patient was in a hypertensive crisis. It has since resolved with treatment of CHF. On admission, her glucose was noted to be 700. She had no evidence of DKA. She was started on IV insulin with resolution.   Individual  issues as below:  1. Acute on chronic diastolic CHF, resolved with IV Lasix. Appears to be at baseline at this point. Eecho from 5/16 shows preserved EF with diastolic dysfunction. She is not a candidate for ACE-I due to renal dysfunction. Will discharge on oral diuretic.  2. Hypertensive crisis, resolved. Overall BP improved and stable.  3. Chronic hypoxic respiratory failure related to COPD and CHF. She is chronically on 2L.  4. COPD, appears stable. Continue home medications.   5. CKD Stage IV. Renal function at baseline. 6. DM Type 2, stable. Glucose 700 on admission, improved with IV Insulin. Transitioned to basal insulin and SSI. No evidence of DKA.  7. Elevated troponin. Flat. Secondary to CKD, strain. Chronically elevated. No further evaluation. 8. Anemia of CKD, stable.   Consultants:  Diabetic counselor  Procedures:  none  Antibiotics:  None   Discharge Instructions Discharge Instructions    (HEART FAILURE PATIENTS) Call MD:  Anytime you have any of the following symptoms: 1) 3 pound weight gain in 24 hours or 5 pounds in 1 week 2) shortness of breath, with or without a dry hacking cough 3) swelling in the hands, feet or stomach 4) if you have to sleep on extra pillows at night in order to breathe.    Complete by:  As directed      AMB Referral to [facility]    Complete by:  As directed   Please refer to [place] for Transition of Care program. Patient has HF, COPD and Diabetes Verbal consent  obtained 11/09/16  Please call with any questions or concerns: Krystal Crochet. [name], RN, BSN, CCM  [facility] [phone]  Reason for consult:  [facility] active  Diagnoses of:   Heart Failure COPD/ Pneumonia Diabetes    Expected date of contact:  1-3 days (reserved for hospital discharges)     Diet - low sodium heart healthy    Complete by:  As directed      Diet Carb Modified    Complete by:  As directed      Discharge instructions     Complete by:  As directed   Call your physician or seek immediate medical attention for pain, shortness of breath, difficulty lying flat, weight gain, swelling or worsening of condition. Weigh yourself each morning and keep a paper notebook and bring to each doctor's appointment.     Increase activity slowly    Complete by:  As directed             Current Discharge Medication List    START taking these medications   Details  polyethylene glycol (MIRALAX / GLYCOLAX) packet Take 17 g by mouth daily as needed for mild constipation. Qty: 30 each, Refills: 0    torsemide (DEMADEX) 20 MG tablet Take 1 tablet (20 mg total) by mouth daily. Qty: 30 tablet, Refills: 0      CONTINUE these medications which have CHANGED   Details  Fluticasone-Salmeterol (ADVAIR DISKUS) 250-50 MCG/DOSE AEPB Inhale 1 puff into the lungs 2 (two) times daily. Qty: 60 each, Refills: 1      CONTINUE these medications which have NOT CHANGED   Details  aspirin 81 MG EC tablet Take 81 mg by mouth every morning.     calcitRIOL (ROCALTROL) 0.25 MCG capsule Take 0.25 mcg by mouth daily.     clopidogrel (PLAVIX) 75 MG tablet TAKE 1 TABLET ONCE DAILY. Qty: 30 tablet, Refills: 0    ezetimibe (ZETIA) 10 MG tablet Take 10 mg by mouth every morning.     famotidine (PEPCID) 20 MG tablet Take 1 tablet (20 mg total) by mouth daily. Qty: 30 tablet, Refills: 1    febuxostat (ULORIC) 40 MG tablet Take 40 mg by mouth daily.    ferrous sulfate 325 (65 FE) MG tablet Take 325 mg by mouth daily.     insulin aspart (NOVOLOG FLEXPEN) 100 UNIT/ML FlexPen Inject 20 Units into the skin 3 (three) times daily with meals.     Insulin Glargine (LANTUS SOLOSTAR) 100 UNIT/ML Solostar Pen Inject 20 Units into the skin at bedtime.     isosorbide mononitrate (IMDUR) 60 MG 24 hr tablet Take 60 mg by mouth daily.    Liraglutide (VICTOZA) 18 MG/3ML SOPN Inject 1.8 mg into the skin daily.    lisinopril (PRINIVIL,ZESTRIL) 10 MG tablet  TAKE (1) TABLET BY MOUTH ONCE DAILY. Qty: 30 tablet, Refills: 0    nebivolol (BYSTOLIC) 5 MG tablet Take 1 tablet (5 mg total) by mouth 2 (two) times daily. Qty: 30 tablet, Refills: 1    NITROSTAT 0.4 MG SL tablet PLACE ONE (1) TABLET UNDER TONGUE EVERY 5 MINUTES UP TO (3) DOSES AS NEEDED FOR CHEST PAIN. Qty: 25 tablet, Refills: 0    ranolazine (RANEXA) 500 MG 12 hr tablet Take 1 tablet (500 mg total) by mouth 2 (two) times daily. Qty: 60 tablet, Refills: 3    rosuvastatin (CRESTOR) 20 MG tablet Take 1 tablet (20 mg total) by mouth every evening. Qty: 30 tablet, Refills: 1  STOP taking these medications     budesonide-formoterol (SYMBICORT) 160-4.5 MCG/ACT inhaler        Allergies  Allergen Reactions  . Morphine Shortness Of Breath and Swelling  . Penicillins Shortness Of Breath and Swelling    Has patient had a PCN reaction causing immediate rash, facial/tongue/throat swelling, SOB or lightheadedness with hypotension: No Has patient had a PCN reaction causing severe rash involving mucus membranes or skin necrosis: No Has patient had a PCN reaction that required hospitalization Yes Has patient had a PCN reaction occurring within the last 10 years: No If all of the above answers are "NO", then may proceed with Cephalosporin use.   . Shellfish Allergy     The results of significant diagnostics from this hospitalization (including imaging, microbiology, ancillary and laboratory) are listed below for reference.    Significant Diagnostic Studies: Dg Chest Port 1 View  11/08/2015  CLINICAL DATA:  Shortness of breath, wheezing, chest pain EXAM: PORTABLE CHEST 1 VIEW COMPARISON:  08/19/2015 FINDINGS: New diffuse interstitial coarsening. Chronic cardiomegaly with stable mild aortic tortuosity. Blood flow appears cephalized but normal vascular pedicle width. No effusion or pneumothorax. IMPRESSION: 1. New bronchitic or congestive interstitial opacities. 2. Chronic mild cardiomegaly.  Electronically Signed   By: Monte Fantasia M.D.   On: 11/08/2015 06:02     Labs: Basic Metabolic Panel:  Recent Labs Lab 11/08/15 0547 11/08/15 1535 11/09/15 0427 11/10/15 0527  NA 142 133* 137 139  K 3.8 4.0 3.8 3.6  CL 103 93* 97* 97*  CO2 29 26 29  35*  GLUCOSE 308* 761* 315* 279*  BUN 25* 32* 36* 38*  CREATININE 1.64* 2.20* 2.11* 2.11*  CALCIUM 9.0 8.9 9.0 8.7*   Liver Function Tests:  Recent Labs Lab 11/08/15 0547  AST 16  ALT 13*  ALKPHOS 60  BILITOT 0.6  PROT 6.6  ALBUMIN 3.3*    CBC:  Recent Labs Lab 11/08/15 0547  WBC 8.3  NEUTROABS 5.8  HGB 9.9*  HCT 32.6*  MCV 75.8*  PLT 263   Cardiac Enzymes:  Recent Labs Lab 11/08/15 0547 11/08/15 1201 11/08/15 1821 11/08/15 2328  TROPONINI 0.04* 0.05* QUANTITY NOT SUFFICIENT, UNABLE TO PERFORM TEST 0.06*     Recent Labs  11/08/15 0547  BNP 915.0*     CBG:  Recent Labs Lab 11/09/15 0723 11/09/15 1151 11/09/15 1703 11/09/15 2027 11/10/15 0754  GLUCAP 256* 302* 254* 295* 234*    Principal Problem:   Acute on chronic diastolic CHF (congestive heart failure) (HCC) Active Problems:   Hyperlipidemia   Essential hypertension   Chronic respiratory failure with hypoxia (HCC)   COPD with chronic bronchitis (HCC)   Type 2 diabetes mellitus with stage 3 chronic kidney disease ([place])   Uncontrolled type 2 diabetes mellitus ([place])   Hypertensive crisis   Anemia, unspecified   Time coordinating discharge: 45 minutes  Signed:  Murray Hodgkins, MD [facility] 11/10/2015, 8:00 AM.    By signing my name below, I, Rosalie Doctor attest that this documentation has been prepared under the direction and in the presence of Murray Hodgkins, MD Electronically signed: Rosalie Doctor, Scribe.  11/10/2015  I personally performed the services described in this documentation. All medical record entries made by the scribe were at my direction. I have reviewed the chart and agree that the  record reflects my personal performance and is accurate and complete. Murray Hodgkins, MD

## 2015-11-10 NOTE — Care Management Note (Signed)
Case Management Note  Patient Details  Name: ADONIA MANGER MRN: [id number] Date of Birth: 1936-06-26  Subjective/Objective:                    Action/Plan:   Expected Discharge Date:                  Expected Discharge Plan:  [place]  In-House Referral:  NA  Discharge planning Services  CM Consult  Post Acute Care Choice:  Home Health, Durable Medical Equipment Choice offered to:  Patient  DME Arranged:  Kasandra Knudsen DME Agency:  [place] Arranged:  RN, PT, Nurse's Aide [facility] Agency:  [place]  Status of Service:  Completed, signed off  Medicare Important Message Given:    Date Medicare IM Given:    Medicare IM give by:    Date Additional Medicare IM Given:    Additional Medicare Important Message give by:     If discussed at [place] of Stay Meetings, dates discussed:    Additional Comments: Pt discharged home today with [facility] RN, PT and aide (per pts choice). Romualdo Bolk of [facility] is aware and will collect the pts information from the chart. Pt also requested quad cane from [facility] and that will be delivered to pts room prior to discharge. [place] services to start within 48 hours of discharge. Pt has home O2 and neb machine in place. Pt and pts nurse aware of discharge arrangements. Christinia Gully Stafford, RN 11/10/2015, 3:44 PM

## 2015-11-10 NOTE — Progress Notes (Signed)
Pt discharged home today per Dr. Sarajane Jews.  Pt's IV site D/C'd and WDL.  Pt's VSS.  Pt provided with home medication list, discharge instructions and prescriptions.  Verbalized understanding.  Pt awaiting arrival of ride at this time.

## 2015-11-10 NOTE — Progress Notes (Signed)
PROGRESS NOTE  Krystal [name] [id number] DOB: Aug 10, 1936 DOA: 11/08/2015 PCP: [name], MD  Summary: 79 y.o. female with HLD, CKD Stage III, chronic diastolic heart failure presented with complaints of shortness of breath and productive cough.  She is on chronic 2L oxygen. While in the ED found to be in hypertensive crisis with mild wheezing. She was admitted for further evaluation and started on diuretics.   Assessment/Plan: 1. Acute on chronic diastolic CHF, resolved with IV Lasix.  Appears to be at baseline at this point. Eecho from 5/16 shows preserved EF with diastolic dysfunction.  She is not a candidate for ACE-I due to renal dysfunction. Will discharge on oral diuretic.  2. Hypertensive crisis, resolved. Overall BP improved and stable.   3. Chronic hypoxic respiratory failure related to COPD and CHF. She is chronically on 2L.  4. COPD, appears stable. Continue home medications.   5. CKD Stage IV. Renal function at baseline. 6. DM Type 2, stable. Glucose 700 on admission, improved with IV Insulin. Transitioned to basal insulin and SSI. No evidence of DKA.   7. Elevated troponin. Flat. Secondary to CKD, strain. Chronically elevated. No further evaluation. 8. Anemia of CKD, stable.   Overall improved, will place on Demadex and reschedule follow up with Dr. Pennelope Bracken for next week for repeat BMET.  Home with HHPT  Murray Hodgkins, MD  [facility]  Pager [phone] If 7PM-7AM, please contact night-coverage at [email or web address], password [facility] 11/10/2015, 6:31 AM  LOS: 2 days   Consultants:  Diabetic counselor  Procedures:    Antibiotics:    HPI/Subjective: Does not feel well. Has a headache, nasal drainage, and sinus pressure. States this is chronic for her. Nauseous but no vomiting. Breathing is better. Swelling has resolved.  Objective: Filed Vitals:   11/09/15 1840 11/09/15 2020 11/09/15 2034 11/10/15 0535  BP: 152/78 160/93  167/80  Pulse: 72 74   68  Temp:  97.4 F (36.3 C)  98.6 F (37 C)  TempSrc:  Oral  Oral  Resp: 19 19  20   Height:      Weight:  88.905 kg (196 lb)  88.905 kg (196 lb)  SpO2: 99% 100% 98% 100%    Intake/Output Summary (Last 24 hours) at 11/10/15 0631 Last data filed at 11/10/15 0055  Gross per 24 hour  Intake    720 ml  Output    300 ml  Net    420 ml     Filed Weights   11/09/15 0500 11/09/15 2020 11/10/15 0535  Weight: 89.4 kg (197 lb 1.5 oz) 88.905 kg (196 lb) 88.905 kg (196 lb)    Exam:     VSS, afebrile, not hypoxic  General:  Appears comfortable, calm. Sitting in chair.  Cardiovascular: Regular rate and rhythm, no murmur, rub or gallop. Trace bilateral lower extremity edema. Telemetry: Sinus rhythm, no arrhythmias  Respiratory: Clear to auscultation bilaterally, no wheezes, rales or rhonchi. Normal respiratory effort. Abdomen: soft, ntnd Musculoskeletal: grossly normal tone bilateral upper and lower extremities Psychiatric: grossly normal mood and affect, speech fluent and appropriate Neurologic: grossly non-focal.  New data reviewed:  Creatinine and BUN unchanged, BMP unremarkable.   Pertinent data since admission:  BNP 915  Troponin 0.04  Hgb 9.9  Pending data:  none  Scheduled Meds: . amLODipine  5 mg Oral Daily  . aspirin  81 mg Oral q morning - 10a  . budesonide-formoterol  2 puff Inhalation BID  . calcitRIOL  0.25 mcg Oral Daily  . clopidogrel  75 mg Oral Daily  . enoxaparin (LOVENOX) injection  30 mg Subcutaneous Q24H  . ezetimibe  10 mg Oral q morning - 10a  . famotidine  20 mg Oral Daily  . febuxostat  40 mg Oral Daily  . ferrous sulfate  325 mg Oral Daily  . furosemide  60 mg Intravenous BID  . insulin aspart  0-9 Units Subcutaneous TID WC  . insulin aspart  4 Units Subcutaneous TID WC  . insulin glargine  22 Units Subcutaneous QHS  . ipratropium-albuterol  3 mL Nebulization Q6H WA  . isosorbide mononitrate  60 mg Oral Daily  . nebivolol  5 mg Oral BID   . ranolazine  500 mg Oral BID  . rosuvastatin  20 mg Oral QPM  . sodium chloride  3 mL Intravenous Q12H   Continuous Infusions:    Principal Problem:   Acute on chronic diastolic CHF (congestive heart failure) (HCC) Active Problems:   Hyperlipidemia   Essential hypertension   Chronic respiratory failure with hypoxia (HCC)   COPD with chronic bronchitis (HCC)   Type 2 diabetes mellitus with stage 3 chronic kidney disease ([place])   Uncontrolled type 2 diabetes mellitus ([place])   Hypertensive crisis   Anemia, unspecified     By signing my name below, I, Rosalie Doctor attest that this documentation has been prepared under the direction and in the presence of Murray Hodgkins, MD Electronically signed: Rosalie Doctor, Scribe. 11/10/2015   I personally performed the services described in this documentation. All medical record entries made by the scribe were at my direction. I have reviewed the chart and agree that the record reflects my personal performance and is accurate and complete. Murray Hodgkins, MD

## 2015-11-11 ENCOUNTER — Other Ambulatory Visit: Payer: Self-pay | Admitting: *Deleted

## 2015-11-11 NOTE — Patient Outreach (Addendum)
Transition of care week #1 Call attempted to patient, she states she is unable to talk, requests call back. Plan to attempt again later. [name]. [name], RN, BSN, [place] [phone]

## 2015-11-11 NOTE — Patient Outreach (Signed)
Call to patient for Transition of Care week #1  Spoke with patient. She reports she just came home last night.  She states that she has recently move to an apartment, she lives alone. She manages independently. She has had a scale in the past but with the move she is not sure if she still has one. Patient states new medication was called to [facility] and it will be delivered today. Patient denies any HF symptoms.  RNCM reviewed HF symptoms to monitor for. Plan to continue Transition of care calls, patient agrees to plan Reminded patient that [facility] should call and come out by tomorrow and that [facility] is not the same as [facility] care services.  Gave RNCM name and contact information.  [name]. [name], RN, BSN, [place] [phone]

## 2015-11-15 DIAGNOSIS — K219 Gastro-esophageal reflux disease without esophagitis: Secondary | ICD-10-CM | POA: Diagnosis not present

## 2015-11-15 DIAGNOSIS — Z9981 Dependence on supplemental oxygen: Secondary | ICD-10-CM | POA: Diagnosis not present

## 2015-11-15 DIAGNOSIS — Z955 Presence of coronary angioplasty implant and graft: Secondary | ICD-10-CM | POA: Diagnosis not present

## 2015-11-15 DIAGNOSIS — J449 Chronic obstructive pulmonary disease, unspecified: Secondary | ICD-10-CM | POA: Diagnosis not present

## 2015-11-15 DIAGNOSIS — E1121 Type 2 diabetes mellitus with diabetic nephropathy: Secondary | ICD-10-CM | POA: Diagnosis not present

## 2015-11-15 DIAGNOSIS — I130 Hypertensive heart and chronic kidney disease with heart failure and stage 1 through stage 4 chronic kidney disease, or unspecified chronic kidney disease: Secondary | ICD-10-CM | POA: Diagnosis not present

## 2015-11-15 DIAGNOSIS — I5033 Acute on chronic diastolic (congestive) heart failure: Secondary | ICD-10-CM | POA: Diagnosis not present

## 2015-11-15 DIAGNOSIS — Z794 Long term (current) use of insulin: Secondary | ICD-10-CM | POA: Diagnosis not present

## 2015-11-15 DIAGNOSIS — Z87891 Personal history of nicotine dependence: Secondary | ICD-10-CM | POA: Diagnosis not present

## 2015-11-15 DIAGNOSIS — E1165 Type 2 diabetes mellitus with hyperglycemia: Secondary | ICD-10-CM | POA: Diagnosis not present

## 2015-11-15 DIAGNOSIS — E785 Hyperlipidemia, unspecified: Secondary | ICD-10-CM | POA: Diagnosis not present

## 2015-11-15 DIAGNOSIS — D638 Anemia in other chronic diseases classified elsewhere: Secondary | ICD-10-CM | POA: Diagnosis not present

## 2015-11-15 DIAGNOSIS — N184 Chronic kidney disease, stage 4 (severe): Secondary | ICD-10-CM | POA: Diagnosis not present

## 2015-11-15 DIAGNOSIS — I252 Old myocardial infarction: Secondary | ICD-10-CM | POA: Diagnosis not present

## 2015-11-15 DIAGNOSIS — J9611 Chronic respiratory failure with hypoxia: Secondary | ICD-10-CM | POA: Diagnosis not present

## 2015-11-15 DIAGNOSIS — I455 Other specified heart block: Secondary | ICD-10-CM | POA: Diagnosis not present

## 2015-11-15 DIAGNOSIS — Z8542 Personal history of malignant neoplasm of other parts of uterus: Secondary | ICD-10-CM | POA: Diagnosis not present

## 2015-11-15 DIAGNOSIS — I2510 Atherosclerotic heart disease of native coronary artery without angina pectoris: Secondary | ICD-10-CM | POA: Diagnosis not present

## 2015-11-16 ENCOUNTER — Other Ambulatory Visit: Payer: Self-pay | Admitting: *Deleted

## 2015-11-16 DIAGNOSIS — I130 Hypertensive heart and chronic kidney disease with heart failure and stage 1 through stage 4 chronic kidney disease, or unspecified chronic kidney disease: Secondary | ICD-10-CM | POA: Diagnosis not present

## 2015-11-16 DIAGNOSIS — J449 Chronic obstructive pulmonary disease, unspecified: Secondary | ICD-10-CM | POA: Diagnosis not present

## 2015-11-16 DIAGNOSIS — I5033 Acute on chronic diastolic (congestive) heart failure: Secondary | ICD-10-CM | POA: Diagnosis not present

## 2015-11-16 DIAGNOSIS — N184 Chronic kidney disease, stage 4 (severe): Secondary | ICD-10-CM | POA: Diagnosis not present

## 2015-11-16 DIAGNOSIS — E1165 Type 2 diabetes mellitus with hyperglycemia: Secondary | ICD-10-CM | POA: Diagnosis not present

## 2015-11-16 DIAGNOSIS — E1121 Type 2 diabetes mellitus with diabetic nephropathy: Secondary | ICD-10-CM | POA: Diagnosis not present

## 2015-11-16 NOTE — Patient Outreach (Signed)
Transition of care week #2 Call attempted to patient home. No answer, unable to leave a message. Will attempt to contact later. [name]. [name], RN, BSN, [place] [phone]

## 2015-11-17 ENCOUNTER — Encounter: Payer: Self-pay | Admitting: Adult Health

## 2015-11-17 ENCOUNTER — Ambulatory Visit (INDEPENDENT_AMBULATORY_CARE_PROVIDER_SITE_OTHER): Payer: Medicare Other | Admitting: Adult Health

## 2015-11-17 VITALS — BP 160/78 | HR 86 | Ht 67.0 in | Wt 191.0 lb

## 2015-11-17 DIAGNOSIS — I5032 Chronic diastolic (congestive) heart failure: Secondary | ICD-10-CM | POA: Diagnosis not present

## 2015-11-17 DIAGNOSIS — G4733 Obstructive sleep apnea (adult) (pediatric): Secondary | ICD-10-CM | POA: Diagnosis not present

## 2015-11-17 DIAGNOSIS — I10 Essential (primary) hypertension: Secondary | ICD-10-CM

## 2015-11-17 DIAGNOSIS — I2510 Atherosclerotic heart disease of native coronary artery without angina pectoris: Secondary | ICD-10-CM

## 2015-11-17 NOTE — Progress Notes (Deleted)
Name: Krystal [name]    DOB: 17-Feb-1936  Age: 79 y.o.  MR#: [id number]       PCP:  [name], MD      Insurance: Payor: MEDICARE / Plan: MEDICARE PART A AND B / Product Type: *No Product type* /   CC:    Chief Complaint  Patient presents with  . Coronary Artery Disease  . Congestive Heart Failure    VS Filed Vitals:   11/17/15 1421  BP: 160/78  Pulse: 86  Height: 5\' 7"  (1.702 m)  Weight: 191 lb (86.637 kg)  SpO2: 93%    Weights Current Weight  11/17/15 191 lb (86.637 kg)  11/10/15 196 lb (88.905 kg)  10/26/15 200 lb (90.719 kg)    Blood Pressure  BP Readings from Last 3 Encounters:  11/17/15 160/78  11/10/15 158/56  10/26/15 175/90     Admit date:  (Not on file) Last encounter with RMR:  07/26/2015   Allergy Morphine; Penicillins; and Shellfish allergy  Current Outpatient Prescriptions  Medication Sig Dispense Refill  . aspirin 81 MG EC tablet Take 81 mg by mouth every morning.     . calcitRIOL (ROCALTROL) 0.25 MCG capsule Take 0.25 mcg by mouth daily.     . clopidogrel (PLAVIX) 75 MG tablet TAKE 1 TABLET ONCE DAILY. 30 tablet 0  . ezetimibe (ZETIA) 10 MG tablet Take 10 mg by mouth every morning.     . famotidine (PEPCID) 20 MG tablet Take 1 tablet (20 mg total) by mouth daily. 30 tablet 1  . febuxostat (ULORIC) 40 MG tablet Take 40 mg by mouth daily.    . ferrous sulfate 325 (65 FE) MG tablet Take 325 mg by mouth daily.     . Fluticasone-Salmeterol (ADVAIR DISKUS) 250-50 MCG/DOSE AEPB Inhale 1 puff into the lungs 2 (two) times daily. 60 each 1  . insulin aspart (NOVOLOG FLEXPEN) 100 UNIT/ML FlexPen Inject 20 Units into the skin 3 (three) times daily with meals.     . Insulin Glargine (LANTUS SOLOSTAR) 100 UNIT/ML Solostar Pen Inject 20 Units into the skin at bedtime.     . isosorbide mononitrate (IMDUR) 60 MG 24 hr tablet Take 60 mg by mouth daily.    . Liraglutide (VICTOZA) 18 MG/3ML SOPN Inject 1.8 mg into the skin daily.    Marland Kitchen lisinopril (PRINIVIL,ZESTRIL) 10  MG tablet TAKE (1) TABLET BY MOUTH ONCE DAILY. 30 tablet 0  . nebivolol (BYSTOLIC) 5 MG tablet Take 1 tablet (5 mg total) by mouth 2 (two) times daily. 30 tablet 1  . NITROSTAT 0.4 MG SL tablet PLACE ONE (1) TABLET UNDER TONGUE EVERY 5 MINUTES UP TO (3) DOSES AS NEEDED FOR CHEST PAIN. (Patient taking differently: PLACE ONE (1) TABLET UNDER TONGUE EVERY 5 MINUTES UP TO (3) DOSES AS NEEDED FOR CHEST  used 2 weeks ago) 25 tablet 0  . polyethylene glycol (MIRALAX / GLYCOLAX) packet Take 17 g by mouth daily as needed for mild constipation. 30 each 0  . ranolazine (RANEXA) 500 MG 12 hr tablet Take 1 tablet (500 mg total) by mouth 2 (two) times daily. 60 tablet 3  . rosuvastatin (CRESTOR) 20 MG tablet Take 1 tablet (20 mg total) by mouth every evening. 30 tablet 1  . torsemide (DEMADEX) 20 MG tablet Take 1 tablet (20 mg total) by mouth daily. 30 tablet 0   No current facility-administered medications for this visit.    Discontinued Meds:   There are no discontinued medications.  Patient Active Problem  List   Diagnosis Date Noted  . Hypertensive crisis 11/08/2015  . Acute on chronic diastolic CHF (congestive heart failure) ([place]) 11/08/2015  . Anemia, unspecified 11/08/2015  . Uncontrolled type 2 diabetes mellitus ([place]) 10/26/2015  . Syncope 08/19/2015  . Chest pain 08/19/2015  . Anemia 06/08/2015  . Hemorrhoids with complication [id number]  . Chronic diastolic heart failure ([place]) 04/15/2015  . Essential hypertension 04/15/2015  . Chronic respiratory failure with hypoxia ([place]) 04/15/2015  . COPD with chronic bronchitis ([place]) 04/15/2015  . Near syncope 04/15/2015  . Type 2 diabetes mellitus with stage 3 chronic kidney disease ([place]) 04/15/2015  . Dizziness and giddiness 04/14/2015  . Dizziness 04/14/2015  . Abnormal involuntary movements 04/12/2015  . Vaginal bleeding, abnormal 01/18/2014  . Leg edema 08/15/2013  . Complete heart block, transiet 01/26/2013  . CKD (chronic kidney disease)  stage 3, GFR 30-59 ml/min 09/18/2012  . OBESITY 05/20/2009  . PERIPHERAL NEUROPATHY 05/20/2009  . NEVUS, ATYPICAL 05/06/2009  . HYPERTENSION, PULMONARY - partially due to LV diastolic dysfunction [id number]  . BRONCHITIS, CHRONIC 12/16/2008  . DIASTOLIC DYSFUNCTION, normal LVF 04/30/2008  . Obstructive sleep apnea 04/30/2008  . Deficiency anemia 09/23/2007  . [place] DISEASE, LUMBOSACRAL SPINE 04/08/2007  . Hyperlipidemia 10/29/2006  . Coronary atherosclerosis 10/29/2006  . ALLERGIC RHINITIS 10/29/2006  . GERD 10/29/2006  . CONSTIPATION, OTHER 10/29/2006  . OSTEOARTHRITIS 10/29/2006  . COLONIC POLYPS, HX OF 10/29/2006    LABS    Component Value Date/Time   NA 139 11/10/2015 0527   NA 137 11/09/2015 0427   NA 133* 11/08/2015 1535   NA 136 11/01/2014 1509   K 3.6 11/10/2015 0527   K 3.8 11/09/2015 0427   K 4.0 11/08/2015 1535   CL 97* 11/10/2015 0527   CL 97* 11/09/2015 0427   CL 93* 11/08/2015 1535   CO2 35* 11/10/2015 0527   CO2 29 11/09/2015 0427   CO2 26 11/08/2015 1535   GLUCOSE 279* 11/10/2015 0527   GLUCOSE 315* 11/09/2015 0427   GLUCOSE 761* 11/08/2015 1535   GLUCOSE 336* 11/01/2014 1509   BUN 38* 11/10/2015 0527   BUN 36* 11/09/2015 0427   BUN 32* 11/08/2015 1535   BUN 44* 11/01/2014 1509   CREATININE 2.11* 11/10/2015 0527   CREATININE 2.11* 11/09/2015 0427   CREATININE 2.20* 11/08/2015 1535   CREATININE 2.48* 04/27/2015 1248   CREATININE 1.52* 05/13/2014 1051   CREATININE 2.62* 03/23/2014 1149   CALCIUM 8.7* 11/10/2015 0527   CALCIUM 9.0 11/09/2015 0427   CALCIUM 8.9 11/08/2015 1535   CALCIUM 8.6 03/11/2014 1102   GFRNONAA 21* 11/10/2015 0527   GFRNONAA 21* 11/09/2015 0427   GFRNONAA 20* 11/08/2015 1535   GFRAA 25* 11/10/2015 0527   GFRAA 25* 11/09/2015 0427   GFRAA 24* 11/08/2015 1535   CMP     Component Value Date/Time   NA 139 11/10/2015 0527   NA 136 11/01/2014 1509   K 3.6 11/10/2015 0527   CL 97* 11/10/2015 0527   CO2 35*  11/10/2015 0527   GLUCOSE 279* 11/10/2015 0527   GLUCOSE 336* 11/01/2014 1509   BUN 38* 11/10/2015 0527   BUN 44* 11/01/2014 1509   CREATININE 2.11* 11/10/2015 0527   CREATININE 2.48* 04/27/2015 1248   CALCIUM 8.7* 11/10/2015 0527   CALCIUM 8.6 03/11/2014 1102   PROT 6.6 11/08/2015 0547   PROT 7.2 11/01/2014 1509   ALBUMIN 3.3* 11/08/2015 0547   ALBUMIN 4.4 11/01/2014 1509   AST 16 11/08/2015 0547   ALT 13* 11/08/2015 0547  ALKPHOS 60 11/08/2015 0547   BILITOT 0.6 11/08/2015 0547   GFRNONAA 21* 11/10/2015 0527   GFRAA 25* 11/10/2015 0527       Component Value Date/Time   WBC 8.3 11/08/2015 0547   WBC 7.4 08/19/2015 1705   WBC 7.2 08/11/2015 1434   HGB 9.9* 11/08/2015 0547   HGB 12.5 08/19/2015 1705   HGB 12.1 08/11/2015 1434   HCT 32.6* 11/08/2015 0547   HCT 40.1 08/19/2015 1705   HCT 38.8 08/11/2015 1434   MCV 75.8* 11/08/2015 0547   MCV 75.9* 08/19/2015 1705   MCV 76.5* 08/11/2015 1434    Lipid Panel     Component Value Date/Time   CHOL 191 12/18/2013 1058   TRIG 186* 12/18/2013 1058   HDL 39* 12/18/2013 1058   CHOLHDL 4.9 12/18/2013 1058   VLDL 37 12/18/2013 1058   LDLCALC 115* 12/18/2013 1058   LDLDIRECT 118.4 08/13/2013 1502    ABG    Component Value Date/Time   PHART 7.354 09/12/2012 1309   PCO2ART 47.6* 09/12/2012 1309   PO2ART 88.0 09/12/2012 1309   HCO3 27.7* 09/12/2012 1314   TCO2 29 09/12/2012 1314   O2SAT 48.0 09/12/2012 1314     Lab Results  Component Value Date   TSH 3.437 08/19/2015   BNP (last 3 results)  Recent Labs  11/08/15 0547  BNP 915.0*    ProBNP (last 3 results) No results for input(s): PROBNP in the last 8760 hours.  Cardiac Panel (last 3 results) No results for input(s): CKTOTAL, CKMB, TROPONINI, RELINDX in the last 72 hours.  Iron/TIBC/Ferritin/ %Sat    Component Value Date/Time   IRON 27* 11/08/2015 1201   TIBC 316 11/08/2015 1201   FERRITIN 188 11/08/2015 1201   IRONPCTSAT 9* 11/08/2015 1201      EKG Orders placed or performed during the hospital encounter of 11/08/15  . EKG 12-Lead  . EKG 12-Lead  . EKG     Prior Assessment and Plan Problem List as of 11/17/2015      Cardiovascular and Mediastinum   Coronary atherosclerosis   HYPERTENSION, PULMONARY - partially due to LV diastolic dysfunction   DIASTOLIC DYSFUNCTION, normal LVF   Complete heart block, transiet   Chronic diastolic heart failure [facility])   Last Assessment & Plan 04/27/2015 Office Visit Written 04/27/2015  2:42 PM by Imogene Burn, PA-C    No evidence of heart failure on exam.      Essential hypertension   Last Assessment & Plan 04/27/2015 Office Visit Written 04/27/2015  2:42 PM by Imogene Burn, PA-C    2-D echo on 04/15/15 showed severe LVH with grade 1 diastolic dysfunction. She has normal systolic function. She has a little bit a drop in her blood pressure with change of position. We'll stop amlodipine and monitor closely.      Near syncope   Hemorrhoids with complication   Last Assessment & Plan 06/08/2015 Office Visit Written 06/08/2015 11:37 AM by Danie Binder, MD    RECTAL PAIN IMPROVED WITH MEDICAL MANAGEMENT.  CONTINUE TO MONITOR SYMPTOMS. CONSIDER CHR OR FLEX/SIG IH BANDING. FOLLOW UP IN 4 MOS.        Syncope   Hypertensive crisis   Acute on chronic diastolic CHF (congestive heart failure) (HCC)     Respiratory   BRONCHITIS, CHRONIC   ALLERGIC RHINITIS   Obstructive sleep apnea   Chronic respiratory failure with hypoxia (HCC)   COPD with chronic bronchitis (HCC)     Digestive   GERD  Last Assessment & Plan 06/08/2015 Office Visit Written 06/08/2015 11:35 AM by Danie Binder, MD    SYMPTOMS CONTROLLED/RESOLVED.  CONTINUE TO MONITOR SYMPTOMS.       CONSTIPATION, OTHER   Last Assessment & Plan 06/08/2015 Office Visit Written 06/08/2015 11:36 AM by Danie Binder, MD    SYMPTOMS FAIRLY WELL CONTROLLED.  MIRLALX PRN FOLLOW UP IN 4 MOS.          Endocrine   Type 2 diabetes  mellitus with stage 3 chronic kidney disease ([place])   Uncontrolled type 2 diabetes mellitus ([place])     Nervous and Auditory   PERIPHERAL NEUROPATHY   Abnormal involuntary movements     Musculoskeletal and Integument   NEVUS, ATYPICAL   OSTEOARTHRITIS   DEGENERATIVE DISC DISEASE, LUMBOSACRAL SPINE     Genitourinary   CKD (chronic kidney disease) stage 3, GFR 30-59 ml/min     Other   Hyperlipidemia   OBESITY   Deficiency anemia   COLONIC POLYPS, HX OF   Last Assessment & Plan 09/27/2011 Office Visit Written 09/27/2011  4:29 PM by Danie Binder, MD    TCS DEC 2012 DUE TO CARDIOPULMONARY COMLICATIONS WITH MODERATE SEDATION. HOLD IRON FOR 7 DAYS. HOLD BYETTA AND GLIPIZIDE AM OF PROCEDURE. 1/2 HUMALOG NIGHT PRIOR AND HOLD AM OF OPV PRN       Leg edema   Vaginal bleeding, abnormal   Dizziness and giddiness   Dizziness   Last Assessment & Plan 04/27/2015 Office Visit Written 04/27/2015  2:41 PM by Imogene Burn, PA-C    Patient continues to have dizziness despite hospitalization for hydration and adjustment of her medications. She is not significantly orthostatic here in the office today. Her blood pressure does drop a little when she goes from lying to sitting. She is upset about taking so many medications and feels like they're contributing. 2-D echo did show severe LVH. I told her we can stop the amlodipine and monitor her blood pressure closely. She does have occasional chest pains and reluctant to decrease it him nor at this time. Follow-up in one week.      Anemia   Last Assessment & Plan 06/08/2015 Office Visit Edited 06/08/2015 11:36 AM by Danie Binder, MD    MICROCYTIC-NO BRBPR OR MELENA.  NEEDS EGD W/ MAC DUE TO POLYPHARMACY. PT WOULD LIKE TO WAIT UNTIL AFTER SEP 2016 CONTINUE PLAVIX. CONSIDER CT SCAN OF ABD/PELVIS DUE TO POSTPRANDIAL BLOATING/ANEMIA IF NO SOURCE FOR DYSPEPSIA IDENTIFIED. FOLLOW UP IN 4 MOS.        Chest pain   Anemia, unspecified       Imaging: Dg  Chest Port 1 View  11/08/2015  CLINICAL DATA:  Shortness of breath, wheezing, chest pain EXAM: PORTABLE CHEST 1 VIEW COMPARISON:  08/19/2015 FINDINGS: New diffuse interstitial coarsening. Chronic cardiomegaly with stable mild aortic tortuosity. Blood flow appears cephalized but normal vascular pedicle width. No effusion or pneumothorax. IMPRESSION: 1. New bronchitic or congestive interstitial opacities. 2. Chronic mild cardiomegaly. Electronically Signed   By: Monte Fantasia M.D.   On: 11/08/2015 06:02

## 2015-11-17 NOTE — Patient Instructions (Signed)
Your physician recommends that you schedule a follow-up appointment in: 1 Month  Your physician recommends that you continue on your current medications as directed. Please refer to the Current Medication list given to you today.  If you need a refill on your cardiac medications before your next appointment, please call your pharmacy.  Thank you for choosing [facility]!    

## 2015-11-17 NOTE — Progress Notes (Signed)
Cardiology Office Note   Date:  11/17/2015   ID:  Krystal [name], DOB 1936-06-16, MRN [id number]  PCP:  [name], MD  Cardiologist:  Woodroe Chen, NP   Chief Complaint  Patient presents with  . Coronary Artery Disease  . Congestive Heart Failure      History of Present Illness: Krystal [name] is a 79 y.o. female who presents for ongoing assessment and management of chronic diastolic heart failure, hypertension, coronary artery disease with multiple previously placed stents, COPD and is on chronic oxygen at home (3L), sleep apnea and uses CPAP, hyperlipidemia, and insulin-dependent diabetes mellitus. Echocardiogram on 03/05/2014 demonstrated vigorous left ventricular systolic function, EF Q000111Q, mild LVH, grade 1 diastolic dysfunction, elevated filling pressures, mild mitral regurgitation, trivial tricuspid regurgitation, and mildly elevated pulmonary pressures of 37 mmHg.   On last visit with Dr. Bronson Ing, Imdur was decreased to 60 mg daily, and Lisinopril was increased to 10 mg daily. Spironolactone was discontinued.   She comes today s/p discharge after admission for diastolic CHF. She is down 9 lbs. She is being followed by [facility] and has had trouble connecting with them. She has memory issues and states that her CPAP is no longer working well. She has not mentioned this to the [facility] staff.   She is feeling better, physically, but has a lot of stress in the care of her father. She is unable to do so any longer with her own health issues.   Past Medical History  Diagnosis Date  . Sleep apnea     CPAP  . Myalgia   . Palpitations   . Microcytic anemia     History of occult blood in stool  . DJD (degenerative joint disease)   . CAD (coronary artery disease) 2009    5 stents- #3 in RCA, #1 each in LAD and AVG  . Osteoarthritis   . Essential hypertension   . Hyperlipidemia   . GERD (gastroesophageal reflux disease)   . Allergic rhinitis   . Personal  history of colonic polyps   . HOH (hard of hearing)   . MI (myocardial infarction) ([place]) 1998  . Glaucoma   . Diastolic dysfunction, left ventricle   . CKD (chronic kidney disease) stage 3, GFR 30-59 ml/min   . Emphysema lung (HCC)     2L N/C continuously  . Complete heart block, transient 2014  . Gout   . Type II diabetes mellitus with nephropathy ([place])   . History of uterine cancer     Past Surgical History  Procedure Laterality Date  . Ectopic pregnancy surgery    . Foot surgery      Left and right for callous  . Cervical discectomy      L5 left/hemilminectomy  . Colonoscopy  09/2006    Int hemmorhoids, COMPLICATED BY CARDIOPULMONARY COMPLICATIONS  . Abdominal hysterectomy  04/2010    Uterine cancer,TAHBSO  . Polypectomy  10/22/2011    Internal hemorrhoids/sessile polyp  . Coronary angioplasty  1/99, 1/07, 1/08, 4/09    5 cardiac stents total  . Left and right heart catheterization with coronary angiogram N/A 09/12/2012    Procedure: LEFT AND RIGHT HEART CATHETERIZATION WITH CORONARY ANGIOGRAM;  Surgeon: Sanda Klein, MD;  Location: [facility] CATH LAB;  Service: Cardiovascular;  Laterality: N/A;     Current Outpatient Prescriptions  Medication Sig Dispense Refill  . aspirin 81 MG EC tablet Take 81 mg by mouth every morning.     . calcitRIOL (ROCALTROL) 0.25 MCG capsule  Take 0.25 mcg by mouth daily.     . clopidogrel (PLAVIX) 75 MG tablet TAKE 1 TABLET ONCE DAILY. 30 tablet 0  . ezetimibe (ZETIA) 10 MG tablet Take 10 mg by mouth every morning.     . famotidine (PEPCID) 20 MG tablet Take 1 tablet (20 mg total) by mouth daily. 30 tablet 1  . febuxostat (ULORIC) 40 MG tablet Take 40 mg by mouth daily.    . ferrous sulfate 325 (65 FE) MG tablet Take 325 mg by mouth daily.     . Fluticasone-Salmeterol (ADVAIR DISKUS) 250-50 MCG/DOSE AEPB Inhale 1 puff into the lungs 2 (two) times daily. 60 each 1  . insulin aspart (NOVOLOG FLEXPEN) 100 UNIT/ML FlexPen Inject 20 Units into the skin 3  (three) times daily with meals.     . Insulin Glargine (LANTUS SOLOSTAR) 100 UNIT/ML Solostar Pen Inject 20 Units into the skin at bedtime.     . isosorbide mononitrate (IMDUR) 60 MG 24 hr tablet Take 60 mg by mouth daily.    . Liraglutide (VICTOZA) 18 MG/3ML SOPN Inject 1.8 mg into the skin daily.    Marland Kitchen lisinopril (PRINIVIL,ZESTRIL) 10 MG tablet TAKE (1) TABLET BY MOUTH ONCE DAILY. 30 tablet 0  . nebivolol (BYSTOLIC) 5 MG tablet Take 1 tablet (5 mg total) by mouth 2 (two) times daily. 30 tablet 1  . NITROSTAT 0.4 MG SL tablet PLACE ONE (1) TABLET UNDER TONGUE EVERY 5 MINUTES UP TO (3) DOSES AS NEEDED FOR CHEST PAIN. (Patient taking differently: PLACE ONE (1) TABLET UNDER TONGUE EVERY 5 MINUTES UP TO (3) DOSES AS NEEDED FOR CHEST  used 2 weeks ago) 25 tablet 0  . polyethylene glycol (MIRALAX / GLYCOLAX) packet Take 17 g by mouth daily as needed for mild constipation. 30 each 0  . ranolazine (RANEXA) 500 MG 12 hr tablet Take 1 tablet (500 mg total) by mouth 2 (two) times daily. 60 tablet 3  . rosuvastatin (CRESTOR) 20 MG tablet Take 1 tablet (20 mg total) by mouth every evening. 30 tablet 1  . torsemide (DEMADEX) 20 MG tablet Take 1 tablet (20 mg total) by mouth daily. 30 tablet 0   No current facility-administered medications for this visit.    Allergies:   Morphine; Penicillins; and Shellfish allergy    Social History:  The patient  reports that she quit smoking about 9 years ago. Her smoking use included Cigarettes. She started smoking about 60 years ago. She has a 20.4 pack-year smoking history. She has never used smokeless tobacco. She reports that she does not drink alcohol or use illicit drugs.   Family History:  The patient's family history is negative for Anesthesia problems, Hypotension, Malignant hyperthermia, and Pseudochol deficiency.    ROS: All other systems are reviewed and negative. Unless otherwise mentioned in H&P    PHYSICAL EXAM: VS:  BP 160/78 mmHg  Pulse 86  Ht 5\' 7"   (1.702 m)  Wt 191 lb (86.637 kg)  BMI 29.91 kg/m2  SpO2 93% , BMI Body mass index is 29.91 kg/(m^2). GEN: Well nourished, well developed, in no acute distress HEENT: normal Neck: no JVD, carotid bruits, or masses Cardiac: RRR; no murmurs, rubs, or gallops,no edema  Respiratory:  clear to auscultation bilaterally, normal work of breathing. GI: soft, nontender, nondistended, + BS MS: no deformity or atrophy Skin: warm and dry, no rash Neuro:  Strength and sensation are intact Psych: euthymic mood, full affect   Recent Labs: 08/19/2015: TSH 3.437 11/08/2015: ALT 13*; B  Natriuretic Peptide 915.0*; Hemoglobin 9.9*; Platelets 263 11/10/2015: BUN 38*; Creatinine, Ser 2.11*; Potassium 3.6; Sodium 139    Lipid Panel    Component Value Date/Time   CHOL 191 12/18/2013 1058   TRIG 186* 12/18/2013 1058   HDL 39* 12/18/2013 1058   CHOLHDL 4.9 12/18/2013 1058   VLDL 37 12/18/2013 1058   LDLCALC 115* 12/18/2013 1058   LDLDIRECT 118.4 08/13/2013 1502      Wt Readings from Last 3 Encounters:  11/17/15 191 lb (86.637 kg)  11/10/15 196 lb (88.905 kg)  10/26/15 200 lb (90.719 kg)     ASSESSMENT AND PLAN:  1. Acute on Chronic Diastolic CHF: She is well compensated today. Will continue torsemide. She will be seen in one month.  2. OSA: She will have [facility] follow up with her concerning this device. She may need to have it replaced or repaired. Will defer to [facility] for recommendations  3. Hypertension: BP elevated here in the office. She states that the nurse who sees her told her that her BP was normalized. Will continue her on current regimen. Will check BMET on next visit.    Current medicines are reviewed at length with the patient today.    Labs/ tests ordered today include: No orders of the defined types were placed in this encounter.     Disposition:   FU with one month.  Signed, Jory Sims, NP  11/17/2015 3:05 PM    [facility] [address]  [place], [place] [address] Phone: [phone]; Fax: [phone]

## 2015-11-18 DIAGNOSIS — J449 Chronic obstructive pulmonary disease, unspecified: Secondary | ICD-10-CM | POA: Diagnosis not present

## 2015-11-18 DIAGNOSIS — E1165 Type 2 diabetes mellitus with hyperglycemia: Secondary | ICD-10-CM | POA: Diagnosis not present

## 2015-11-18 DIAGNOSIS — N184 Chronic kidney disease, stage 4 (severe): Secondary | ICD-10-CM | POA: Diagnosis not present

## 2015-11-18 DIAGNOSIS — E1121 Type 2 diabetes mellitus with diabetic nephropathy: Secondary | ICD-10-CM | POA: Diagnosis not present

## 2015-11-18 DIAGNOSIS — I5033 Acute on chronic diastolic (congestive) heart failure: Secondary | ICD-10-CM | POA: Diagnosis not present

## 2015-11-18 DIAGNOSIS — I130 Hypertensive heart and chronic kidney disease with heart failure and stage 1 through stage 4 chronic kidney disease, or unspecified chronic kidney disease: Secondary | ICD-10-CM | POA: Diagnosis not present

## 2015-11-24 ENCOUNTER — Other Ambulatory Visit: Payer: Self-pay | Admitting: *Deleted

## 2015-11-24 ENCOUNTER — Other Ambulatory Visit: Payer: Self-pay

## 2015-11-24 DIAGNOSIS — E1121 Type 2 diabetes mellitus with diabetic nephropathy: Secondary | ICD-10-CM | POA: Diagnosis not present

## 2015-11-24 DIAGNOSIS — J449 Chronic obstructive pulmonary disease, unspecified: Secondary | ICD-10-CM | POA: Diagnosis not present

## 2015-11-24 DIAGNOSIS — I130 Hypertensive heart and chronic kidney disease with heart failure and stage 1 through stage 4 chronic kidney disease, or unspecified chronic kidney disease: Secondary | ICD-10-CM | POA: Diagnosis not present

## 2015-11-24 DIAGNOSIS — E1165 Type 2 diabetes mellitus with hyperglycemia: Secondary | ICD-10-CM | POA: Diagnosis not present

## 2015-11-24 DIAGNOSIS — I5033 Acute on chronic diastolic (congestive) heart failure: Secondary | ICD-10-CM | POA: Diagnosis not present

## 2015-11-24 DIAGNOSIS — N184 Chronic kidney disease, stage 4 (severe): Secondary | ICD-10-CM | POA: Diagnosis not present

## 2015-11-24 MED ORDER — GLUCOSE BLOOD VI STRP: ORAL_STRIP | Status: DC

## 2015-11-24 NOTE — Patient Outreach (Signed)
Transition of Care week #3  Call to patient Patient reporting she is better than in the hospital but "not well". She reports some rt sided pain, denies urinary frequency, burning, nausea or vomiting and no falls.  Patient will see, Dr. Berdine Addison, primary care physician for issue, appointment scheduled for next week. Patient has followed up with Cardiology since  Hospitalization, she states no medication changes.  Assessment: HF-no increase in symptoms, needs scale Pain-right sided, patient to see MD next week.  Plan: Patient agrees to initial home visit. Continue Transition of Care program Plan to visit next week.  [name]. [name], RN, BSN, [place] [phone]

## 2015-11-25 ENCOUNTER — Other Ambulatory Visit: Payer: Self-pay

## 2015-11-25 DIAGNOSIS — I130 Hypertensive heart and chronic kidney disease with heart failure and stage 1 through stage 4 chronic kidney disease, or unspecified chronic kidney disease: Secondary | ICD-10-CM | POA: Diagnosis not present

## 2015-11-25 DIAGNOSIS — I5033 Acute on chronic diastolic (congestive) heart failure: Secondary | ICD-10-CM | POA: Diagnosis not present

## 2015-11-25 DIAGNOSIS — E1165 Type 2 diabetes mellitus with hyperglycemia: Secondary | ICD-10-CM | POA: Diagnosis not present

## 2015-11-25 DIAGNOSIS — J449 Chronic obstructive pulmonary disease, unspecified: Secondary | ICD-10-CM | POA: Diagnosis not present

## 2015-11-25 DIAGNOSIS — E1121 Type 2 diabetes mellitus with diabetic nephropathy: Secondary | ICD-10-CM | POA: Diagnosis not present

## 2015-11-25 DIAGNOSIS — N184 Chronic kidney disease, stage 4 (severe): Secondary | ICD-10-CM | POA: Diagnosis not present

## 2015-11-25 MED ORDER — GLUCOSE BLOOD VI STRP: ORAL_STRIP | Status: DC

## 2015-11-29 DIAGNOSIS — N184 Chronic kidney disease, stage 4 (severe): Secondary | ICD-10-CM | POA: Diagnosis not present

## 2015-11-29 DIAGNOSIS — I130 Hypertensive heart and chronic kidney disease with heart failure and stage 1 through stage 4 chronic kidney disease, or unspecified chronic kidney disease: Secondary | ICD-10-CM | POA: Diagnosis not present

## 2015-11-29 DIAGNOSIS — J449 Chronic obstructive pulmonary disease, unspecified: Secondary | ICD-10-CM | POA: Diagnosis not present

## 2015-11-29 DIAGNOSIS — E1165 Type 2 diabetes mellitus with hyperglycemia: Secondary | ICD-10-CM | POA: Diagnosis not present

## 2015-11-29 DIAGNOSIS — E1121 Type 2 diabetes mellitus with diabetic nephropathy: Secondary | ICD-10-CM | POA: Diagnosis not present

## 2015-11-29 DIAGNOSIS — I5033 Acute on chronic diastolic (congestive) heart failure: Secondary | ICD-10-CM | POA: Diagnosis not present

## 2015-12-01 ENCOUNTER — Ambulatory Visit: Payer: Self-pay | Admitting: *Deleted

## 2015-12-01 ENCOUNTER — Other Ambulatory Visit: Payer: Self-pay | Admitting: *Deleted

## 2015-12-01 DIAGNOSIS — E1121 Type 2 diabetes mellitus with diabetic nephropathy: Secondary | ICD-10-CM | POA: Diagnosis not present

## 2015-12-01 DIAGNOSIS — N184 Chronic kidney disease, stage 4 (severe): Secondary | ICD-10-CM | POA: Diagnosis not present

## 2015-12-01 DIAGNOSIS — F419 Anxiety disorder, unspecified: Secondary | ICD-10-CM | POA: Diagnosis not present

## 2015-12-01 DIAGNOSIS — I10 Essential (primary) hypertension: Secondary | ICD-10-CM | POA: Diagnosis not present

## 2015-12-01 DIAGNOSIS — R109 Unspecified abdominal pain: Secondary | ICD-10-CM | POA: Diagnosis not present

## 2015-12-01 DIAGNOSIS — J449 Chronic obstructive pulmonary disease, unspecified: Secondary | ICD-10-CM | POA: Diagnosis not present

## 2015-12-01 DIAGNOSIS — E1165 Type 2 diabetes mellitus with hyperglycemia: Secondary | ICD-10-CM | POA: Diagnosis not present

## 2015-12-01 DIAGNOSIS — I130 Hypertensive heart and chronic kidney disease with heart failure and stage 1 through stage 4 chronic kidney disease, or unspecified chronic kidney disease: Secondary | ICD-10-CM | POA: Diagnosis not present

## 2015-12-01 DIAGNOSIS — I5033 Acute on chronic diastolic (congestive) heart failure: Secondary | ICD-10-CM | POA: Diagnosis not present

## 2015-12-01 NOTE — Patient Outreach (Signed)
Arrived at patient house for scheduled visit. Patient was home but states she has acute doctor appointment at 2 pm and needs to reschedule. Appointment rescheduled. RNCM gave card with contact information and instructed patient to call  If she needs to reschedule again. Plan to visit next week. Krystal [name]. [name], RN, BSN, [place] [phone]

## 2015-12-06 ENCOUNTER — Other Ambulatory Visit: Payer: Self-pay | Admitting: *Deleted

## 2015-12-06 ENCOUNTER — Encounter: Payer: Self-pay | Admitting: *Deleted

## 2015-12-06 ENCOUNTER — Other Ambulatory Visit (HOSPITAL_COMMUNITY): Payer: Self-pay | Admitting: Pulmonary Disease

## 2015-12-06 DIAGNOSIS — I130 Hypertensive heart and chronic kidney disease with heart failure and stage 1 through stage 4 chronic kidney disease, or unspecified chronic kidney disease: Secondary | ICD-10-CM | POA: Diagnosis not present

## 2015-12-06 DIAGNOSIS — R109 Unspecified abdominal pain: Secondary | ICD-10-CM

## 2015-12-06 DIAGNOSIS — N184 Chronic kidney disease, stage 4 (severe): Secondary | ICD-10-CM | POA: Diagnosis not present

## 2015-12-06 DIAGNOSIS — E1165 Type 2 diabetes mellitus with hyperglycemia: Secondary | ICD-10-CM | POA: Diagnosis not present

## 2015-12-06 DIAGNOSIS — I5033 Acute on chronic diastolic (congestive) heart failure: Secondary | ICD-10-CM | POA: Diagnosis not present

## 2015-12-06 DIAGNOSIS — E1121 Type 2 diabetes mellitus with diabetic nephropathy: Secondary | ICD-10-CM | POA: Diagnosis not present

## 2015-12-06 DIAGNOSIS — J449 Chronic obstructive pulmonary disease, unspecified: Secondary | ICD-10-CM | POA: Diagnosis not present

## 2015-12-06 NOTE — Patient Outreach (Signed)
[facility]) Care Management   12/06/2015  Krystal [name] 1935-12-09 [id number]  Krystal [name] is an 80 y.o. female  Subjective:  "I need help with my medications" Patient requesting RNCM call [name] and assist with medication bottle organization. "I am doing better" Patient reports she recently moved to an apartment for a small place but has been inpatient and sick so much she has not had a lot of time to unpack and organize.  Patient also has had to cancel several appointments.   Objective:   BP 138/80 mmHg  Pulse 64  Resp 20  Wt 195 lb (88.451 kg)  SpO2 98% Review of Systems  Constitutional: Negative.   HENT: Negative.   Eyes: Negative.   Respiratory: Negative.   Cardiovascular: Positive for leg swelling.  Gastrointestinal: Negative.   Genitourinary: Positive for frequency.  Musculoskeletal: Positive for myalgias.  Skin: Negative.   Endo/Heme/Allergies: Bruises/bleeds easily.  Psychiatric/Behavioral: Negative.     Physical Exam  Constitutional: She is oriented to person, place, and time. She appears well-developed.  Neck: Normal range of motion.  Cardiovascular: Normal rate and regular rhythm.   Respiratory: Effort normal.  GI: Soft.  Musculoskeletal: Normal range of motion.  Neurological: She is alert and oriented to person, place, and time.  Skin: Skin is warm and dry.  Psychiatric: She has a normal mood and affect. Her behavior is normal. Judgment and thought content normal.    Current Medications:   BP 138/80 mmHg  Pulse 64  Resp 20  Wt 195 lb (88.451 kg)  SpO2 98% Current Outpatient Prescriptions  Medication Sig Dispense Refill  . aspirin 81 MG EC tablet Take 81 mg by mouth every morning.     . calcitRIOL (ROCALTROL) 0.25 MCG capsule Take 0.25 mcg by mouth daily.     . clopidogrel (PLAVIX) 75 MG tablet TAKE 1 TABLET ONCE DAILY. 30 tablet 0  . ezetimibe (ZETIA) 10 MG tablet Take 10 mg by mouth every morning.     .  famotidine (PEPCID) 20 MG tablet Take 1 tablet (20 mg total) by mouth daily. 30 tablet 1  . febuxostat (ULORIC) 40 MG tablet Take 40 mg by mouth daily.    . ferrous sulfate 325 (65 FE) MG tablet Take 325 mg by mouth daily.     . Fluticasone-Salmeterol (ADVAIR DISKUS) 250-50 MCG/DOSE AEPB Inhale 1 puff into the lungs 2 (two) times daily. 60 each 1  . glucose blood test strip Use as instructed 4 x daily. E11.65. One Touch Verio test Strips 150 each 5  . insulin aspart (NOVOLOG FLEXPEN) 100 UNIT/ML FlexPen Inject 20 Units into the skin 3 (three) times daily with meals. Reported on 12/06/2015    . Insulin Glargine (LANTUS SOLOSTAR) 100 UNIT/ML Solostar Pen Inject 20 Units into the skin at bedtime.     . isosorbide mononitrate (IMDUR) 60 MG 24 hr tablet Take 60 mg by mouth daily.    Marland Kitchen lisinopril (PRINIVIL,ZESTRIL) 10 MG tablet TAKE (1) TABLET BY MOUTH ONCE DAILY. 30 tablet 0  . nebivolol (BYSTOLIC) 5 MG tablet Take 1 tablet (5 mg total) by mouth 2 (two) times daily. 30 tablet 1  . NITROSTAT 0.4 MG SL tablet PLACE ONE (1) TABLET UNDER TONGUE EVERY 5 MINUTES UP TO (3) DOSES AS NEEDED FOR CHEST PAIN. (Patient taking differently: PLACE ONE (1) TABLET UNDER TONGUE EVERY 5 MINUTES UP TO (3) DOSES AS NEEDED FOR CHEST  used 2 weeks ago) 25 tablet 0  . polyethylene glycol (  MIRALAX / GLYCOLAX) packet Take 17 g by mouth daily as needed for mild constipation. 30 each 0  . ranolazine (RANEXA) 500 MG 12 hr tablet Take 1 tablet (500 mg total) by mouth 2 (two) times daily. 60 tablet 3  . rosuvastatin (CRESTOR) 20 MG tablet Take 1 tablet (20 mg total) by mouth every evening. 30 tablet 1  . torsemide (DEMADEX) 20 MG tablet Take 1 tablet (20 mg total) by mouth daily. 30 tablet 0  . furosemide (LASIX) 40 MG tablet Take 40 mg by mouth. Reported on 12/06/2015    . Liraglutide (VICTOZA) 18 MG/3ML SOPN Inject 1.8 mg into the skin daily. Reported on 12/06/2015     No current facility-administered medications for this visit.     Functional Status:   In your present state of health, do you have any difficulty performing the following activities: 12/06/2015 11/08/2015  Hearing? N N  Vision? N N  Difficulty concentrating or making decisions? N N  Walking or climbing stairs? Y N  Dressing or bathing? Y N  Doing errands, shopping? N N  Preparing Food and eating ? N -  Using the Toilet? N -  In the past six months, have you accidently leaked urine? Y -  Do you have problems with loss of bowel control? N -  Managing your Medications? Y -  Managing your Finances? N -    Fall/Depression Screening:    Fall Risk  12/06/2015 11/24/2015 10/26/2015 06/24/2015  Falls in the past year? No No No No  Risk for fall due to : Impaired balance/gait - - -  Risk for fall due to (comments): uses cane - - -   PHQ 2/9 Scores 12/06/2015 10/26/2015 06/24/2015  PHQ - 2 Score 0 0 0    Assessment:   HF: New diagnosis, needs education  COPD: patient on oxygen  Diabetes: uncontrolled, patient not on regular shcedule for diet and medications. Needs appointment with endocrinologist, patient will schedule. Medication management: local pharmacist will assist patient with reconciliation  Plan:   [facility] CM Care Plan Problem One        Most Recent Value   Care Plan Problem One  knowledge deficit relating to HF   Role Documenting the Problem One  Care Management Coordinator   Care Plan for Problem One  Active   THN Long Term Goal (31-90 days)  Patient will be able to self monitor for HF symptoms and not have HF readmission in 31 days   THN Long Term Goal Start Date  11/11/15   Interventions for Problem One Long Term Goal  Using teachback, gave [facility] calendar with HF zones, reviewed with patient     [facility] CM Care Plan Problem Two        Most Recent Value   Care Plan Problem Two  Medication management needed as evidenced by confusion over medication   Role Documenting the Problem Two  Care Management [place] for Problem Two  Active    Interventions for Problem Two Long Term Goal   Call to keith at pharmacy set up time for patient to take medications for medication reconciliation. Gave Medication box,faxed med list to [employer]. gave med box reviewed use. Assisted patient with organizing medications and getting rid of duplicate empty bottles that were causing confusion   THN Long Term Goal (31-90) days  Patient will have better understanding of medication and what she is taking and how over the next 90 dats   THN  Long Term Goal Start Date  12/06/15      Will visit again in February Will send initial information/barrier letter to MD Patient will make appointment with Endocrinologist Stanton Kidney E. [name], RN, BSN, [place] [phone]

## 2015-12-07 DIAGNOSIS — I5032 Chronic diastolic (congestive) heart failure: Secondary | ICD-10-CM | POA: Diagnosis not present

## 2015-12-07 DIAGNOSIS — E1122 Type 2 diabetes mellitus with diabetic chronic kidney disease: Secondary | ICD-10-CM | POA: Diagnosis not present

## 2015-12-07 DIAGNOSIS — N183 Chronic kidney disease, stage 3 (moderate): Secondary | ICD-10-CM | POA: Diagnosis not present

## 2015-12-08 DIAGNOSIS — I5033 Acute on chronic diastolic (congestive) heart failure: Secondary | ICD-10-CM | POA: Diagnosis not present

## 2015-12-08 DIAGNOSIS — N184 Chronic kidney disease, stage 4 (severe): Secondary | ICD-10-CM | POA: Diagnosis not present

## 2015-12-08 DIAGNOSIS — E1165 Type 2 diabetes mellitus with hyperglycemia: Secondary | ICD-10-CM | POA: Diagnosis not present

## 2015-12-08 DIAGNOSIS — E1121 Type 2 diabetes mellitus with diabetic nephropathy: Secondary | ICD-10-CM | POA: Diagnosis not present

## 2015-12-08 DIAGNOSIS — I130 Hypertensive heart and chronic kidney disease with heart failure and stage 1 through stage 4 chronic kidney disease, or unspecified chronic kidney disease: Secondary | ICD-10-CM | POA: Diagnosis not present

## 2015-12-08 DIAGNOSIS — J449 Chronic obstructive pulmonary disease, unspecified: Secondary | ICD-10-CM | POA: Diagnosis not present

## 2015-12-12 ENCOUNTER — Ambulatory Visit (HOSPITAL_COMMUNITY)
Admission: RE | Admit: 2015-12-12 | Discharge: 2015-12-12 | Disposition: A | Discharge status: Home / Self Care | Payer: Medicare Other | Source: Ambulatory Visit | Attending: Pulmonary Disease | Admitting: Pulmonary Disease

## 2015-12-12 DIAGNOSIS — M47897 Other spondylosis, lumbosacral region: Secondary | ICD-10-CM | POA: Diagnosis not present

## 2015-12-12 DIAGNOSIS — M47896 Other spondylosis, lumbar region: Secondary | ICD-10-CM | POA: Insufficient documentation

## 2015-12-12 DIAGNOSIS — R109 Unspecified abdominal pain: Secondary | ICD-10-CM

## 2015-12-12 DIAGNOSIS — I700 Atherosclerosis of aorta: Secondary | ICD-10-CM | POA: Insufficient documentation

## 2015-12-13 DIAGNOSIS — N184 Chronic kidney disease, stage 4 (severe): Secondary | ICD-10-CM | POA: Diagnosis not present

## 2015-12-13 DIAGNOSIS — I130 Hypertensive heart and chronic kidney disease with heart failure and stage 1 through stage 4 chronic kidney disease, or unspecified chronic kidney disease: Secondary | ICD-10-CM | POA: Diagnosis not present

## 2015-12-13 DIAGNOSIS — E1165 Type 2 diabetes mellitus with hyperglycemia: Secondary | ICD-10-CM | POA: Diagnosis not present

## 2015-12-13 DIAGNOSIS — E1121 Type 2 diabetes mellitus with diabetic nephropathy: Secondary | ICD-10-CM | POA: Diagnosis not present

## 2015-12-13 DIAGNOSIS — J449 Chronic obstructive pulmonary disease, unspecified: Secondary | ICD-10-CM | POA: Diagnosis not present

## 2015-12-13 DIAGNOSIS — I5033 Acute on chronic diastolic (congestive) heart failure: Secondary | ICD-10-CM | POA: Diagnosis not present

## 2015-12-14 DIAGNOSIS — N184 Chronic kidney disease, stage 4 (severe): Secondary | ICD-10-CM | POA: Diagnosis not present

## 2015-12-14 DIAGNOSIS — E1121 Type 2 diabetes mellitus with diabetic nephropathy: Secondary | ICD-10-CM | POA: Diagnosis not present

## 2015-12-14 DIAGNOSIS — I130 Hypertensive heart and chronic kidney disease with heart failure and stage 1 through stage 4 chronic kidney disease, or unspecified chronic kidney disease: Secondary | ICD-10-CM | POA: Diagnosis not present

## 2015-12-14 DIAGNOSIS — I5033 Acute on chronic diastolic (congestive) heart failure: Secondary | ICD-10-CM | POA: Diagnosis not present

## 2015-12-14 DIAGNOSIS — E1165 Type 2 diabetes mellitus with hyperglycemia: Secondary | ICD-10-CM | POA: Diagnosis not present

## 2015-12-14 DIAGNOSIS — J449 Chronic obstructive pulmonary disease, unspecified: Secondary | ICD-10-CM | POA: Diagnosis not present

## 2015-12-20 DIAGNOSIS — J449 Chronic obstructive pulmonary disease, unspecified: Secondary | ICD-10-CM | POA: Diagnosis not present

## 2015-12-20 DIAGNOSIS — N184 Chronic kidney disease, stage 4 (severe): Secondary | ICD-10-CM | POA: Diagnosis not present

## 2015-12-20 DIAGNOSIS — I5033 Acute on chronic diastolic (congestive) heart failure: Secondary | ICD-10-CM | POA: Diagnosis not present

## 2015-12-20 DIAGNOSIS — I130 Hypertensive heart and chronic kidney disease with heart failure and stage 1 through stage 4 chronic kidney disease, or unspecified chronic kidney disease: Secondary | ICD-10-CM | POA: Diagnosis not present

## 2015-12-20 DIAGNOSIS — E1121 Type 2 diabetes mellitus with diabetic nephropathy: Secondary | ICD-10-CM | POA: Diagnosis not present

## 2015-12-20 DIAGNOSIS — E1165 Type 2 diabetes mellitus with hyperglycemia: Secondary | ICD-10-CM | POA: Diagnosis not present

## 2015-12-21 DIAGNOSIS — I5033 Acute on chronic diastolic (congestive) heart failure: Secondary | ICD-10-CM | POA: Diagnosis not present

## 2015-12-21 DIAGNOSIS — N184 Chronic kidney disease, stage 4 (severe): Secondary | ICD-10-CM | POA: Diagnosis not present

## 2015-12-21 DIAGNOSIS — E1121 Type 2 diabetes mellitus with diabetic nephropathy: Secondary | ICD-10-CM | POA: Diagnosis not present

## 2015-12-21 DIAGNOSIS — E1165 Type 2 diabetes mellitus with hyperglycemia: Secondary | ICD-10-CM | POA: Diagnosis not present

## 2015-12-21 DIAGNOSIS — I130 Hypertensive heart and chronic kidney disease with heart failure and stage 1 through stage 4 chronic kidney disease, or unspecified chronic kidney disease: Secondary | ICD-10-CM | POA: Diagnosis not present

## 2015-12-21 DIAGNOSIS — J449 Chronic obstructive pulmonary disease, unspecified: Secondary | ICD-10-CM | POA: Diagnosis not present

## 2015-12-22 ENCOUNTER — Encounter: Payer: Self-pay | Admitting: Cardiovascular Disease

## 2015-12-22 ENCOUNTER — Ambulatory Visit (INDEPENDENT_AMBULATORY_CARE_PROVIDER_SITE_OTHER): Payer: Medicare Other | Admitting: Cardiovascular Disease

## 2015-12-22 VITALS — BP 128/78 | HR 76 | Ht 67.0 in | Wt 192.0 lb

## 2015-12-22 DIAGNOSIS — I25118 Atherosclerotic heart disease of native coronary artery with other forms of angina pectoris: Secondary | ICD-10-CM | POA: Diagnosis not present

## 2015-12-22 DIAGNOSIS — I10 Essential (primary) hypertension: Secondary | ICD-10-CM

## 2015-12-22 DIAGNOSIS — J438 Other emphysema: Secondary | ICD-10-CM | POA: Diagnosis not present

## 2015-12-22 DIAGNOSIS — R5383 Other fatigue: Secondary | ICD-10-CM

## 2015-12-22 DIAGNOSIS — E785 Hyperlipidemia, unspecified: Secondary | ICD-10-CM

## 2015-12-22 DIAGNOSIS — I5032 Chronic diastolic (congestive) heart failure: Secondary | ICD-10-CM | POA: Diagnosis not present

## 2015-12-22 MED ORDER — NEBIVOLOL HCL 5 MG PO TABS: 5.0000 mg | ORAL_TABLET | Freq: Two times a day (BID) | ORAL | Status: DC

## 2015-12-22 MED ORDER — CLOPIDOGREL BISULFATE 75 MG PO TABS: 75.0000 mg | ORAL_TABLET | Freq: Every day | ORAL | Status: DC

## 2015-12-22 MED ORDER — EZETIMIBE 10 MG PO TABS: 10.0000 mg | ORAL_TABLET | Freq: Every morning | ORAL | Status: DC

## 2015-12-22 MED ORDER — TORSEMIDE 20 MG PO TABS: ORAL_TABLET | ORAL | Status: DC

## 2015-12-22 MED ORDER — ISOSORBIDE MONONITRATE ER 60 MG PO TB24: 60.0000 mg | ORAL_TABLET | Freq: Every day | ORAL | Status: DC

## 2015-12-22 MED ORDER — LISINOPRIL 10 MG PO TABS: ORAL_TABLET | ORAL | Status: DC

## 2015-12-22 NOTE — Patient Instructions (Addendum)
Your physician wants you to follow-up in: 6 months You will receive a reminder letter in the mail two months in advance. If you don't receive a letter, please call our office to schedule the follow-up appointment.   Take an extra torsemide 20 mg as needed for swelling      Thank you for choosing [facility] !

## 2015-12-22 NOTE — Addendum Note (Signed)
Addended by: Barbarann Ehlers A on: 12/22/2015 12:45 PM   Modules accepted: Orders, Medications

## 2015-12-22 NOTE — Progress Notes (Signed)
Patient ID: Krystal [name], female   DOB: 12-16-1935, 80 y.o.   MRN: [id number]      SUBJECTIVE: The patient presents for routine cardiovascular followup. In summary, she has a history of chronic diastolic heart failure, hypertension, coronary artery disease with multiple previously placed stents, COPD and is on chronic oxygen at home (2L), sleep apnea and uses CPAP, hyperlipidemia, and insulin-dependent diabetes mellitus.   Echocardiogram on 04/15/2015 demonstrated vigorous left ventricular systolic function, EF [id number], severe LVH, grade 1 diastolic dysfunction, elevated filling pressures, trivial mitral regurgitation, trivial tricuspid regurgitation, and normal pulmonary pressures of 27 mmHg.   As per Dr. Evette Georges note on 02/14/14, she has coronary artery disease dating back to the 1990s and has undergone multiple stent procedures involving the proximal LAD, circumflex, mid right coronary artery, and distal right coronary artery. Her last catheterization in October 2013 did not reveal high-grade restenosis. She did have 80-90% ostial stenosis and a small jailed first septal perforating artery. She had mild calcification and 20-30% narrowing in the stent in the circumflex vessel. The right coronary artery had 3 stents and there was 40-50% in-stent smooth narrowing with 30% narrowing in the distal stent.   She was hospitalized for acute on chronic diastolic heart failure associated with a hypertensive crisis in December 2016.  She seldom has chest pains. Her chronic exertional dyspnea is unchanged. Her leg swelling has been better controlled with torsemide 20 mg daily. She had to take an extra dose 2 weeks ago for increased leg swelling. She uses 2 L of oxygen chronically. She said her CPAP machine is broken. She does feel fatigued after waking up in the mornings.   Review of Systems: As per "subjective", otherwise negative.  Allergies  Allergen Reactions  . Morphine Shortness Of Breath and  Swelling  . Penicillins Shortness Of Breath and Swelling    Has patient had a PCN reaction causing immediate rash, facial/tongue/throat swelling, SOB or lightheadedness with hypotension: No Has patient had a PCN reaction causing severe rash involving mucus membranes or skin necrosis: No Has patient had a PCN reaction that required hospitalization Yes Has patient had a PCN reaction occurring within the last 10 years: No If all of the above answers are "NO", then may proceed with Cephalosporin use.   . Shellfish Allergy     Current Outpatient Prescriptions  Medication Sig Dispense Refill  . aspirin 81 MG EC tablet Take 81 mg by mouth every morning.     . calcitRIOL (ROCALTROL) 0.25 MCG capsule Take 0.25 mcg by mouth daily.     . clopidogrel (PLAVIX) 75 MG tablet TAKE 1 TABLET ONCE DAILY. 30 tablet 0  . ezetimibe (ZETIA) 10 MG tablet Take 10 mg by mouth every morning.     . famotidine (PEPCID) 20 MG tablet Take 1 tablet (20 mg total) by mouth daily. 30 tablet 1  . febuxostat (ULORIC) 40 MG tablet Take 40 mg by mouth daily.    . ferrous sulfate 325 (65 FE) MG tablet Take 325 mg by mouth daily.     . Fluticasone-Salmeterol (ADVAIR DISKUS) 250-50 MCG/DOSE AEPB Inhale 1 puff into the lungs 2 (two) times daily. 60 each 1  . furosemide (LASIX) 40 MG tablet Take 40 mg by mouth. Reported on 12/06/2015    . glucose blood test strip Use as instructed 4 x daily. E11.65. One Touch Verio test Strips 150 each 5  . insulin aspart (NOVOLOG FLEXPEN) 100 UNIT/ML FlexPen Inject 20 Units into the skin 3 (three) times  daily with meals. Reported on 12/06/2015    . Insulin Glargine (LANTUS SOLOSTAR) 100 UNIT/ML Solostar Pen Inject 20 Units into the skin at bedtime.     . isosorbide mononitrate (IMDUR) 60 MG 24 hr tablet Take 60 mg by mouth daily.    . Liraglutide (VICTOZA) 18 MG/3ML SOPN Inject 1.8 mg into the skin daily. Reported on 12/06/2015    . lisinopril (PRINIVIL,ZESTRIL) 10 MG tablet TAKE (1) TABLET BY MOUTH  ONCE DAILY. 30 tablet 0  . nebivolol (BYSTOLIC) 5 MG tablet Take 1 tablet (5 mg total) by mouth 2 (two) times daily. 30 tablet 1  . NITROSTAT 0.4 MG SL tablet PLACE ONE (1) TABLET UNDER TONGUE EVERY 5 MINUTES UP TO (3) DOSES AS NEEDED FOR CHEST PAIN. (Patient taking differently: PLACE ONE (1) TABLET UNDER TONGUE EVERY 5 MINUTES UP TO (3) DOSES AS NEEDED FOR CHEST  used 2 weeks ago) 25 tablet 0  . polyethylene glycol (MIRALAX / GLYCOLAX) packet Take 17 g by mouth daily as needed for mild constipation. 30 each 0  . ranolazine (RANEXA) 500 MG 12 hr tablet Take 1 tablet (500 mg total) by mouth 2 (two) times daily. 60 tablet 3  . rosuvastatin (CRESTOR) 20 MG tablet Take 1 tablet (20 mg total) by mouth every evening. 30 tablet 1  . torsemide (DEMADEX) 20 MG tablet Take 1 tablet (20 mg total) by mouth daily. 30 tablet 0   No current facility-administered medications for this visit.    Past Medical History  Diagnosis Date  . Sleep apnea     CPAP  . Myalgia   . Palpitations   . Microcytic anemia     History of occult blood in stool  . DJD (degenerative joint disease)   . CAD (coronary artery disease) 2009    5 stents- #3 in RCA, #1 each in LAD and AVG  . Osteoarthritis   . Essential hypertension   . Hyperlipidemia   . GERD (gastroesophageal reflux disease)   . Allergic rhinitis   . Personal history of colonic polyps   . HOH (hard of hearing)   . MI (myocardial infarction) ([place]) 1998  . Glaucoma   . Diastolic dysfunction, left ventricle   . CKD (chronic kidney disease) stage 3, GFR 30-59 ml/min   . Emphysema lung (HCC)     2L N/C continuously  . Complete heart block, transient 2014  . Gout   . Type II diabetes mellitus with nephropathy ([place])   . History of uterine cancer     Past Surgical History  Procedure Laterality Date  . Ectopic pregnancy surgery    . Foot surgery      Left and right for callous  . Cervical discectomy      L5 left/hemilminectomy  . Colonoscopy  09/2006     Int hemmorhoids, COMPLICATED BY CARDIOPULMONARY COMPLICATIONS  . Abdominal hysterectomy  04/2010    Uterine cancer,TAHBSO  . Polypectomy  10/22/2011    Internal hemorrhoids/sessile polyp  . Coronary angioplasty  1/99, 1/07, 1/08, 4/09    5 cardiac stents total  . Left and right heart catheterization with coronary angiogram N/A 09/12/2012    Procedure: LEFT AND RIGHT HEART CATHETERIZATION WITH CORONARY ANGIOGRAM;  Surgeon: Sanda Klein, MD;  [facility] C[facility] LAB;  Service: Cardiovascular;  Laterality: N/A;    Social History   Social History  . Marital Status: Widowed    Spouse Name: N/A  . Number of Children: 0  . Years of Education: 38  Occupational History  .      retired   Social History Main Topics  . Smoking status: Former Smoker -- 0.40 packs/day for 51 years    Types: Cigarettes    Start date: 04/22/1955    Quit date: 10/18/2006  . Smokeless tobacco: Never Used     Comment: quit about 4 yrs ago  . Alcohol Use: No  . Drug Use: No  . Sexual Activity: No   Other Topics Concern  . Not on file   Social History Narrative   Consumes 2 cups of caffeine daily     Filed Vitals:   12/22/15 1128  BP: 128/78  Pulse: 76  Height: 5\' 7"  (1.702 m)  Weight: 192 lb (87.091 kg)  SpO2: 94%    PHYSICAL EXAM General: NAD Neck: No JVD, no thyromegaly.  Lungs: Diminished but clear b/l. No rales or wheezes appreciated.  CV: Nondisplaced PMI. Regular rate and rhythm, normal S1/S2, no S3/S4, no murmur. Trivial periankle edema.  Abdomen: Soft, nontender, no distention.  Neurologic: Alert and oriented x 3.  Psych: Mildly flat affect.  Skin: Normal. Musculoskeletal: No gross deformities.  ECG: Most recent ECG reviewed.      ASSESSMENT AND PLAN: 1. Chronic diastolic heart failure: She is currently euvolemic and stable. Continue torsemide 20 mg. Instructed to take extra 20 mg for increased leg swelling.  2. CAD: Stable ischemic heart disease. She underwent a low  risk, normal nuclear stress test in September 2014. Continue current therapy with aspirin, Plavix, Zetia, Imdur, Bystolic, Ranexa, and Crestor.  3. Essential hypertension: Controlled. No changes.  4. Hyperlipidemia: Continue current therapy with Crestor and Zetia.   Dispo: f/u 6 months with NP  Kate Sable, M.D., F.A.C.C.

## 2015-12-22 NOTE — Addendum Note (Signed)
Addended by: Barbarann Ehlers A on: 12/22/2015 12:50 PM   Modules accepted: Medications

## 2015-12-23 DIAGNOSIS — E1121 Type 2 diabetes mellitus with diabetic nephropathy: Secondary | ICD-10-CM | POA: Diagnosis not present

## 2015-12-23 DIAGNOSIS — J449 Chronic obstructive pulmonary disease, unspecified: Secondary | ICD-10-CM | POA: Diagnosis not present

## 2015-12-23 DIAGNOSIS — I130 Hypertensive heart and chronic kidney disease with heart failure and stage 1 through stage 4 chronic kidney disease, or unspecified chronic kidney disease: Secondary | ICD-10-CM | POA: Diagnosis not present

## 2015-12-23 DIAGNOSIS — N184 Chronic kidney disease, stage 4 (severe): Secondary | ICD-10-CM | POA: Diagnosis not present

## 2015-12-23 DIAGNOSIS — E1165 Type 2 diabetes mellitus with hyperglycemia: Secondary | ICD-10-CM | POA: Diagnosis not present

## 2015-12-23 DIAGNOSIS — I5033 Acute on chronic diastolic (congestive) heart failure: Secondary | ICD-10-CM | POA: Diagnosis not present

## 2015-12-27 ENCOUNTER — Encounter: Payer: Self-pay | Admitting: *Deleted

## 2015-12-27 ENCOUNTER — Other Ambulatory Visit: Payer: Self-pay | Admitting: "Endocrinology

## 2015-12-27 ENCOUNTER — Other Ambulatory Visit: Payer: Self-pay | Admitting: *Deleted

## 2015-12-27 VITALS — BP 142/72 | HR 70 | Resp 20

## 2015-12-27 DIAGNOSIS — E1110 Type 2 diabetes mellitus with ketoacidosis without coma: Secondary | ICD-10-CM

## 2015-12-27 DIAGNOSIS — I509 Heart failure, unspecified: Secondary | ICD-10-CM

## 2015-12-27 NOTE — Patient Outreach (Signed)
[place] [facility]) Care Management   12/27/2015  Krystal [name] [birth year] [id number]  Krystal [name] is an 80 y.o. female  Subjective:  "I've got my medications on line now" Patient states they are straightened out. She is not going with 90 day supply in case they change. Patient reports she needs appointment for endocrinologist, had to miss appointment due to hospital admission.  Patient saw Heart MD, verbalizes he told her to take extra torsemide for "swollen feet"  Patient plans to see primary care next week.   Patient agrees to referral to [facility] referral for medication management.   Objective:   BP 142/72 mmHg  Pulse 70  Resp 20  SpO2 98% Review of Systems  Constitutional: Negative.   HENT: Negative.   Eyes: Negative.   Respiratory: Negative.   Cardiovascular: Positive for leg swelling.  Gastrointestinal: Negative.   Genitourinary: Negative.   Musculoskeletal: Negative.   Skin: Positive for itching.  Neurological: Negative.   Psychiatric/Behavioral: Negative.     Physical Exam  Constitutional: She is oriented to person, place, and time. She appears well-developed.  Cardiovascular:  irregular  GI: Soft. Bowel sounds are normal.  Musculoskeletal: Normal range of motion.  Neurological: She is alert and oriented to person, place, and time.  Skin: Skin is warm and dry.  Does report some itching on arms and legs at times  Psychiatric: She has a normal mood and affect.    Current Medications:   Current Outpatient Prescriptions  Medication Sig Dispense Refill  . aspirin 81 MG EC tablet Take 81 mg by mouth every morning.     . calcitRIOL (ROCALTROL) 0.25 MCG capsule Take 0.25 mcg by mouth daily.     . clopidogrel (PLAVIX) 75 MG tablet Take 1 tablet (75 mg total) by mouth daily. 90 tablet 3  . ezetimibe (ZETIA) 10 MG tablet Take 1 tablet (10 mg total) by mouth every morning. 90 tablet 3  . famotidine (PEPCID) 20 MG tablet Take 1 tablet (20 mg total)  by mouth daily. 30 tablet 1  . febuxostat (ULORIC) 40 MG tablet Take 40 mg by mouth daily.    . ferrous sulfate 325 (65 FE) MG tablet Take 325 mg by mouth daily.     . Fluticasone-Salmeterol (ADVAIR DISKUS) 250-50 MCG/DOSE AEPB Inhale 1 puff into the lungs 2 (two) times daily. 60 each 1  . glucose blood test strip Use as instructed 4 x daily. E11.65. One Touch Verio test Strips 150 each 5  . insulin aspart (NOVOLOG FLEXPEN) 100 UNIT/ML FlexPen Inject 20 Units into the skin 3 (three) times daily with meals. Reported on 12/06/2015    . Insulin Glargine (LANTUS SOLOSTAR) 100 UNIT/ML Solostar Pen Inject 20 Units into the skin at bedtime.     . isosorbide mononitrate (IMDUR) 60 MG 24 hr tablet Take 1 tablet (60 mg total) by mouth daily. 90 tablet 3  . Liraglutide (VICTOZA) 18 MG/3ML SOPN Inject 1.8 mg into the skin daily. Reported on 12/06/2015    . lisinopril (PRINIVIL,ZESTRIL) 10 MG tablet TAKE (1) TABLET BY MOUTH ONCE DAILY. 90 tablet 3  . nebivolol (BYSTOLIC) 5 MG tablet Take 1 tablet (5 mg total) by mouth 2 (two) times daily. 180 tablet 1  . NITROSTAT 0.4 MG SL tablet PLACE ONE (1) TABLET UNDER TONGUE EVERY 5 MINUTES UP TO (3) DOSES AS NEEDED FOR CHEST PAIN. (Patient taking differently: PLACE ONE (1) TABLET UNDER TONGUE EVERY 5 MINUTES UP TO (3) DOSES AS NEEDED FOR CHEST  used 2 weeks ago) 25 tablet 0  . polyethylene glycol (MIRALAX / GLYCOLAX) packet Take 17 g by mouth daily as needed for mild constipation. 30 each 0  . ranolazine (RANEXA) 500 MG 12 hr tablet Take 1 tablet (500 mg total) by mouth 2 (two) times daily. 60 tablet 3  . rosuvastatin (CRESTOR) 20 MG tablet Take 1 tablet (20 mg total) by mouth every evening. 30 tablet 1  . torsemide (DEMADEX) 20 MG tablet May take extra 20 mg daily for swelling 90 tablet 3  . furosemide (LASIX) 40 MG tablet Take 40 mg by mouth. Reported on 12/27/2015     No current facility-administered medications for this visit.    Functional Status:   In your present  state of health, do you have any difficulty performing the following activities: 12/06/2015 11/08/2015  Hearing? N N  Vision? N N  Difficulty concentrating or making decisions? N N  Walking or climbing stairs? Y N  Dressing or bathing? Y N  Doing errands, shopping? N N  Preparing Food and eating ? N -  Using the Toilet? N -  In the past six months, have you accidently leaked urine? Y -  Do you have problems with loss of bowel control? N -  Managing your Medications? Y -  Managing your Finances? N -    Fall/Depression Screening:    Fall Risk  12/06/2015 11/24/2015 10/26/2015 06/24/2015  Falls in the past year? No No No No  Risk for fall due to : Impaired balance/gait - - -  Risk for fall due to (comments): uses cane - - -   PHQ 2/9 Scores 12/06/2015 10/26/2015 06/24/2015  PHQ - 2 Score 0 0 0    Assessment:   HF-needs scale for daily weights Diabetes-needs appointment with endocrinologist Medication management-refer to [place] CM Care Plan Problem One        Most Recent Value   Care Plan Problem One  knowledge deficit relating to HF   Role Documenting the Problem One  Care Management Coordinator   Care Plan for Problem One  Active   THN Long Term Goal (31-90 days)  Patient will be able to self monitor for HF symptoms and not have HF readmission in 31 days   THN Long Term Goal Start Date  11/11/15   Interventions for Problem One Long Term Goal  Using teachback,reviewed HF zones, instructed on need for daily weights and monitoring    THN CM Care Plan Problem Two        Most Recent Value   Care Plan Problem Two  Medication management needed as evidenced by confusion over medication   Role Documenting the Problem Two  Care Management [place] for Problem Two  Active   Interventions for Problem Two Long Term Goal   Call to pharmacy regarding refills, refer to [facility] pharmacist for medication management and reconciliation   THN Long Term Goal (31-90) days  Patient  will have better understanding of medication and what she is taking and how over the next 56 dats   [place] Term Goal Start Date  12/06/15    [facility] CM Care Plan Problem Three        Most Recent Value   Care Plan Problem Three  Diabetes Management as evidenced by average CBG's over 200   Role Documenting the Problem Three  Care Management [place] for Problem Three  Active   [facility] Long Term  Goal Start Date  12/27/15   THN CM Short Term Goal #1 (0-30 days)  Patient will see endocrinologist within the next 30 days   THN CM Short Term Goal #1 Start Date  12/27/15   Interventions for Short Term Goal #1  Call to endocrinologist office, left message requesting appoitnment for patient     Plan to visit again on Monday 01/02/16  [facility] E. [name], RN, BSN, [place] [phone]

## 2015-12-29 DIAGNOSIS — J449 Chronic obstructive pulmonary disease, unspecified: Secondary | ICD-10-CM | POA: Diagnosis not present

## 2015-12-29 DIAGNOSIS — I5033 Acute on chronic diastolic (congestive) heart failure: Secondary | ICD-10-CM | POA: Diagnosis not present

## 2015-12-29 DIAGNOSIS — N184 Chronic kidney disease, stage 4 (severe): Secondary | ICD-10-CM | POA: Diagnosis not present

## 2015-12-29 DIAGNOSIS — E1165 Type 2 diabetes mellitus with hyperglycemia: Secondary | ICD-10-CM | POA: Diagnosis not present

## 2015-12-29 DIAGNOSIS — I130 Hypertensive heart and chronic kidney disease with heart failure and stage 1 through stage 4 chronic kidney disease, or unspecified chronic kidney disease: Secondary | ICD-10-CM | POA: Diagnosis not present

## 2015-12-29 DIAGNOSIS — E1121 Type 2 diabetes mellitus with diabetic nephropathy: Secondary | ICD-10-CM | POA: Diagnosis not present

## 2016-01-02 ENCOUNTER — Encounter: Payer: Self-pay | Admitting: *Deleted

## 2016-01-02 ENCOUNTER — Other Ambulatory Visit: Payer: Self-pay | Admitting: *Deleted

## 2016-01-02 ENCOUNTER — Other Ambulatory Visit: Payer: Self-pay | Admitting: "Endocrinology

## 2016-01-02 DIAGNOSIS — E1165 Type 2 diabetes mellitus with hyperglycemia: Secondary | ICD-10-CM

## 2016-01-02 DIAGNOSIS — IMO0002 Reserved for concepts with insufficient information to code with codable children: Secondary | ICD-10-CM

## 2016-01-02 DIAGNOSIS — E1169 Type 2 diabetes mellitus with other specified complication: Principal | ICD-10-CM

## 2016-01-02 NOTE — Patient Outreach (Signed)
[place] [facility]) Care Management  01/02/2016  Krystal [name] Apr 18, 1936 [id number]  Visit made with patient to deliver scale.  Subjective:  "I am ok today, just tired" Patient reports she has not heard from Dr. Dorris Fetch but she has been on the phone such and may have missed their call. Patient happy to get scale and reports she will weigh and record weights.   Assessment: HF-Needs to weigh daily and document Diabetes-Appontment made with Dr. Dorris Fetch  Plan: [facility] CM Care Plan Problem One        Most Recent Value   Care Plan Problem One  knowledge deficit relating to HF   Role Documenting the Problem One  Care Management [place] for Problem One  Active   THN Long Term Goal (31-90 days)  Patient will be able to self monitor for HF symptoms and not have HF readmission in 31 days   THN Long Term Goal Start Date  11/11/15   Interventions for Problem One Long Term Goal  deliver scales and instructed to weigh daily at same time and to log weights    [facility] CM Care Plan Problem Three        Most Recent Value   Care Plan Problem Three  Diabetes Management as evidenced by average CBG's over 200   Role Documenting the Problem Three  Care Management [place] for Problem Three  Active   THN Long Term Goal Start Date  12/27/15   THN CM Short Term Goal #1 (0-30 days)  Patient will see endocrinologist within the next 30 days   THN CM Short Term Goal #1 Start Date  12/27/15   Interventions for Short Term Goal #1  Call to endocrinologist office, requested appoitnment, scheduled  for 2/27     Patient to get labs later this week Patient to see Dr.Nida 2/27 RNCM will visit next month  Mary E. [name], RN, BSN, [place] [phone]

## 2016-01-04 DIAGNOSIS — N184 Chronic kidney disease, stage 4 (severe): Secondary | ICD-10-CM | POA: Diagnosis not present

## 2016-01-04 DIAGNOSIS — I5033 Acute on chronic diastolic (congestive) heart failure: Secondary | ICD-10-CM | POA: Diagnosis not present

## 2016-01-04 DIAGNOSIS — I130 Hypertensive heart and chronic kidney disease with heart failure and stage 1 through stage 4 chronic kidney disease, or unspecified chronic kidney disease: Secondary | ICD-10-CM | POA: Diagnosis not present

## 2016-01-04 DIAGNOSIS — E1121 Type 2 diabetes mellitus with diabetic nephropathy: Secondary | ICD-10-CM | POA: Diagnosis not present

## 2016-01-04 DIAGNOSIS — E1165 Type 2 diabetes mellitus with hyperglycemia: Secondary | ICD-10-CM | POA: Diagnosis not present

## 2016-01-04 DIAGNOSIS — J449 Chronic obstructive pulmonary disease, unspecified: Secondary | ICD-10-CM | POA: Diagnosis not present

## 2016-01-10 DIAGNOSIS — E1122 Type 2 diabetes mellitus with diabetic chronic kidney disease: Secondary | ICD-10-CM | POA: Diagnosis not present

## 2016-01-11 ENCOUNTER — Other Ambulatory Visit: Payer: Self-pay | Admitting: Pharmacist

## 2016-01-11 NOTE — Patient Outreach (Signed)
[facility]) Care Management  01/11/2016  Krystal [name] 11-27-1935 [id number]   [name] is a 80yo who was referred to [facility] for medication management.  I made outreach call to patient to schedule initial home visit.  Patient answered the phone but reports she is about to walk out the door.  Patient requested that I call her back tomorrow after 10:00 AM to schedule an appointment.  Will make outreach call on 01/12/16.    [name], Pharm.D. Pharmacy Resident [place] [phone]

## 2016-01-12 ENCOUNTER — Other Ambulatory Visit: Payer: Self-pay | Admitting: Pharmacist

## 2016-01-12 ENCOUNTER — Other Ambulatory Visit: Payer: Self-pay | Admitting: "Endocrinology

## 2016-01-12 DIAGNOSIS — E1165 Type 2 diabetes mellitus with hyperglycemia: Secondary | ICD-10-CM

## 2016-01-12 DIAGNOSIS — E1169 Type 2 diabetes mellitus with other specified complication: Secondary | ICD-10-CM | POA: Diagnosis not present

## 2016-01-12 DIAGNOSIS — IMO0002 Reserved for concepts with insufficient information to code with codable children: Secondary | ICD-10-CM

## 2016-01-12 LAB — COMPREHENSIVE METABOLIC PANEL
ALBUMIN: 3.7 g/dL (ref 3.6–5.1)
ALK PHOS: 67 U/L (ref 33–130)
ALT: 10 U/L (ref 6–29)
AST: 12 U/L (ref 10–35)
BILIRUBIN TOTAL: 0.4 mg/dL (ref 0.2–1.2)
BUN: 35 mg/dL — ABNORMAL HIGH (ref 7–25)
CALCIUM: 9.3 mg/dL (ref 8.6–10.4)
CO2: 29 mmol/L (ref 20–31)
Chloride: 97 mmol/L — ABNORMAL LOW (ref 98–110)
Creat: 2.23 mg/dL — ABNORMAL HIGH (ref 0.60–0.93)
GLUCOSE: 256 mg/dL — AB (ref 65–99)
POTASSIUM: 4 mmol/L (ref 3.5–5.3)
Sodium: 137 mmol/L (ref 135–146)
TOTAL PROTEIN: 6.7 g/dL (ref 6.1–8.1)

## 2016-01-12 NOTE — Patient Outreach (Signed)
[facility]) Care Management  01/12/2016  [name] Apr 05, 1936 [id number]   Krystal [name] is a 80yo who was referred to [facility] for medication management.  I made outreach call to patient today as requested to schedule initial home visit.  Initial home visit scheduled for 01/20/16 at 11:00 AM.     [name], Pharm.D. Pharmacy Resident [place] [phone]

## 2016-01-13 DIAGNOSIS — N184 Chronic kidney disease, stage 4 (severe): Secondary | ICD-10-CM | POA: Diagnosis not present

## 2016-01-13 DIAGNOSIS — I130 Hypertensive heart and chronic kidney disease with heart failure and stage 1 through stage 4 chronic kidney disease, or unspecified chronic kidney disease: Secondary | ICD-10-CM | POA: Diagnosis not present

## 2016-01-13 DIAGNOSIS — E1165 Type 2 diabetes mellitus with hyperglycemia: Secondary | ICD-10-CM | POA: Diagnosis not present

## 2016-01-13 DIAGNOSIS — I5033 Acute on chronic diastolic (congestive) heart failure: Secondary | ICD-10-CM | POA: Diagnosis not present

## 2016-01-13 DIAGNOSIS — E1121 Type 2 diabetes mellitus with diabetic nephropathy: Secondary | ICD-10-CM | POA: Diagnosis not present

## 2016-01-13 DIAGNOSIS — J449 Chronic obstructive pulmonary disease, unspecified: Secondary | ICD-10-CM | POA: Diagnosis not present

## 2016-01-13 LAB — HEMOGLOBIN A1C
Hgb A1c MFr Bld: 11.7 % — ABNORMAL HIGH (ref <5.7)
MEAN PLASMA GLUCOSE: 289 mg/dL — AB (ref <117)

## 2016-01-16 ENCOUNTER — Ambulatory Visit: Payer: Medicare Other | Admitting: "Endocrinology

## 2016-01-20 ENCOUNTER — Other Ambulatory Visit: Payer: Self-pay

## 2016-01-20 ENCOUNTER — Other Ambulatory Visit: Payer: Self-pay | Admitting: Pharmacist

## 2016-01-20 ENCOUNTER — Other Ambulatory Visit: Payer: Self-pay | Admitting: *Deleted

## 2016-01-20 NOTE — Patient Outreach (Signed)
Co-visit made to patient with [name], Pharmacist and her pharmacy student. Subjective: Patient reports she has had some congestion due to allergies, she thinks. Patient has had some dizziness, when she stands up, just started yesterday. She has not taken her medications today. Patient saw Dr. Berdine Addison last week, no changes to medications. Patient missed appointment with Dr. Dorris Fetch, she thought it was this coming week.   Objective: Neatly groomed and dressed, home clean but cluttered BP 144/86 mmHg  Pulse 82  Resp 20  Ht 1.702 m (5\' 7" )  Wt 191 lb (86.637 kg)  BMI 29.91 kg/m2  SpO2 97%  Assessment: HF patient weighing daily, weight vary 190-193 Diabetes: missed endocrinologist appointment, needs to reschedule   Plan: [facility] CM Care Plan Problem One        Most Recent Value   Care Plan Problem One  knowledge deficit relating to HF   Role Documenting the Problem One  Care Management Coordinator   Care Plan for Problem One  Active   THN Long Term Goal (31-90 days)  Patient will be able to self monitor for HF symptoms and not have HF readmission in 31 days   THN Long Term Goal Start Date  01/20/16   Interventions for Problem One Long Term Goal  Patient weighing daily, encouraged to document in [place] calendar    [facility] CM Care Plan Problem Two        Most Recent Value   Care Plan Problem Two  Medication management needed as evidenced by confusion over medication   Role Documenting the Problem Two  Care Management [place] for Problem Two  Active   Interventions for Problem Two Long Term Goal   Co-visit with Pharmacist   [facility] Long Term Goal (31-90) days  Patient will have better understanding of medication and what she is taking and how over the next 2 dats   THN Long Term Goal Start Date  12/06/15    [facility] CM Care Plan Problem Three        Most Recent Value   Care Plan Problem Three  Diabetes Management as evidenced by average CBG's over 200   Role Documenting the  Problem Three  Care Management [place] for Problem Three  Active   THN Long Term Goal Start Date  12/27/15   THN CM Short Term Goal #1 (0-30 days)  Patient will see endocrinologist within the next 30 days   THN CM Short Term Goal #1 Start Date  12/27/15   Interventions for Short Term Goal #1  Called to endocrinologist office to reschedule missed call. Pharmacist reviewing insulin     Call to Dr. Dorris Fetch to reschedule appointment- Plan to visit again later in month. Patient will report dizziness to Md if worsens.  [name]. [name], RN, BSN, [place] [phone]

## 2016-01-20 NOTE — Patient Outreach (Signed)
[facility]) Care Management  [place]   01/20/2016  Krystal [name] 05-27-1936 [id number]  Subjective: Krystal [name] is a 80yo who was referred to [facility] for medication management.  I made initial home visit today.  This was a joint visit with [facility] CMRN [place] Amor.  Visit was also attended by Annie Sable, Pharmacy student.     Patient reports she currently fills her prescriptions with [facility] in [place].  Patient reports she uses their delivery service to have her medications delivered to her home.  Patient reports she also gets samples of several of her medications from her doctor's office including Uloric, Zetia, Crestor, Bystolic, and Ranexa.  Patient reports adherence with her medications but appears to have some confusion over her medication regimen especially her diabetes regimen.  Patient reports she is currently taking Lantus 20 units and Novolog 8 units with meals.  Patient does not appear to be taking Victoza.  Patient reports blood glucose has been elevated with numbers in the 200-300s.     Objective:   Current Medications: Current Outpatient Prescriptions  Medication Sig Dispense Refill  . aspirin 81 MG EC tablet Take 81 mg by mouth every morning.     . clopidogrel (PLAVIX) 75 MG tablet Take 1 tablet (75 mg total) by mouth daily. 90 tablet 3  . ezetimibe (ZETIA) 10 MG tablet Take 1 tablet (10 mg total) by mouth every morning. 90 tablet 3  . famotidine (PEPCID) 20 MG tablet Take 1 tablet (20 mg total) by mouth daily. 30 tablet 1  . febuxostat (ULORIC) 40 MG tablet Take 40 mg by mouth daily.    . ferrous sulfate 325 (65 FE) MG tablet Take 325 mg by mouth daily.     . Fluticasone-Salmeterol (ADVAIR DISKUS) 250-50 MCG/DOSE AEPB Inhale 1 puff into the lungs 2 (two) times daily. 60 each 1  . glucose blood test strip Use as instructed 4 x daily. E11.65. One Touch Verio test Strips 150 each 5  . insulin aspart (NOVOLOG FLEXPEN) 100  UNIT/ML FlexPen Inject 8 Units into the skin 3 (three) times daily with meals. Reported on 12/06/2015    . isosorbide mononitrate (IMDUR) 60 MG 24 hr tablet Take 1 tablet (60 mg total) by mouth daily. 90 tablet 3  . LANTUS SOLOSTAR 100 UNIT/ML Solostar Pen INJECT 40 UNITS IN THE MORNING. (Patient taking differently: INJECT 20 UNITS IN THE MORNING.) 15 mL 2  . lisinopril (PRINIVIL,ZESTRIL) 10 MG tablet TAKE (1) TABLET BY MOUTH ONCE DAILY. 90 tablet 3  . nebivolol (BYSTOLIC) 5 MG tablet Take 1 tablet (5 mg total) by mouth 2 (two) times daily. 180 tablet 1  . NITROSTAT 0.4 MG SL tablet PLACE ONE (1) TABLET UNDER TONGUE EVERY 5 MINUTES UP TO (3) DOSES AS NEEDED FOR CHEST PAIN. (Patient taking differently: PLACE ONE (1) TABLET UNDER TONGUE EVERY 5 MINUTES UP TO (3) DOSES AS NEEDED FOR CHEST  used 2 weeks ago) 25 tablet 0  . polyethylene glycol (MIRALAX / GLYCOLAX) packet Take 17 g by mouth daily as needed for mild constipation. 30 each 0  . ranolazine (RANEXA) 500 MG 12 hr tablet Take 1 tablet (500 mg total) by mouth 2 (two) times daily. 60 tablet 3  . rosuvastatin (CRESTOR) 20 MG tablet Take 1 tablet (20 mg total) by mouth every evening. 30 tablet 1  . torsemide (DEMADEX) 20 MG tablet May take extra 20 mg daily for swelling 90 tablet 3  . calcitRIOL (  ROCALTROL) 0.25 MCG capsule Take 0.25 mcg by mouth daily. Reported on 01/20/2016    . Liraglutide (VICTOZA) 18 MG/3ML SOPN Inject 1.8 mg into the skin daily. Reported on 01/20/2016     No current facility-administered medications for this visit.    Functional Status: In your present state of health, do you have any difficulty performing the following activities: 12/06/2015 11/08/2015  Hearing? N N  Vision? N N  Difficulty concentrating or making decisions? N N  Walking or climbing stairs? Y N  Dressing or bathing? Y N  Doing errands, shopping? N N  Preparing Food and eating ? N -  Using the Toilet? N -  In the past six months, have you accidently leaked  urine? Y -  Do you have problems with loss of bowel control? N -  Managing your Medications? Y -  Managing your Finances? N -    Fall/Depression Screening: PHQ 2/9 Scores 01/20/2016 12/06/2015 10/26/2015 06/24/2015  PHQ - 2 Score 0 0 0 0    Assessment: 1.  Medication management:  Patient currently uses a weekly pill box with morning and evening slots.  Patient only had one morning slot filled with medication; however, the whole evening box was filled.  During visit, patient refilled her weekly planner.  I provided education when needed regarding how to properly fill medication planner.  Patient did not have enough of her some of her medications (lisinopril, clopidogrel, torsemide) to fill the full week.  I assisted patient in calling [facility] to request a refill of these medications and confirmed that medications should be delivered today or tomorrow.  Educated patient to put a small slip of paper with the medication name on it in the boxes that do not have the medication as a place holder for when the medication is delivered by the pharmacy.  Patient did not have any calcitriol nor did she have any empty bottles of calcitriol with her medications.  I inquired about calcitriol when I called [facility] and they reported that patient has not filled calcitriol since August 2016 and does not have any refills available.  I will update patient's PCP that patient has not been adherent with calcitriol and request clarification on whether patient should resume calcitriol.    Reviewed patient's diabetes regimen with her.  Patient reports she is currently taking Lantus 20 units daily and Novolog 8 units with meals.  Patient does not appear to be taking Victoza and per [facility], patient has not filled Victoza since June 2016.  Patient's medication list indicates that patient should be taking Lantus 40 units daily; however, I reviewed her chart and Lantus dose was listed as  Lantus 30 units at most recent endocrinology office visit on 10/26/15 then Lantus dose was listed as Lantus 20 units when patient was discharged from the hospital on 11/09/16.  Patient was scheduled for an endocrinology visit on 01/16/16 but missed the appointment due to confusion over appointment date.  [facility] CMRN called patient's endocrinology office and rescheduled appointment for 02/03/16.  I made a call to patient's endocrinology office to clarify patient's insulin dose and whether patient should resume Victoza prior to office visit.    2.  Medication assistance:  Patient is on several brand name medications that are very expensive: Uloric, Advair, Novolog, Lantus, Bystolic, Ranexa.  Patient currently receives sample of Uloric, Ranexa, Bystolic, Crestor, and Zetia from her providers office but reports difficulty affording the medications if she is unable to get  samples.  Discussed Extra Help and reviewed eligibility requirements for Extra Help.  Patient reports she does not qualify for Extra Help based on her income.  Will research patient assistance programs and/or [employer] foundation and discuss other medication assistance options at next visit.    Plan: 1.  Patient to take all medications as prescribed using her weekly pill box.   2.  I called Dr. Liliane Channel office to clarify patient's Lantus dose and to ask whether Dr. Dorris Fetch would advise patient to restart Victoza prior to her office visit on 02/03/16.  Will update patient once I hear back from Dr. Liliane Channel office.  3.  I will send a message to Dr. Berdine Addison regarding patient's non-adherence with calcitriol and request clarification on whether patient should resume calcitriol.   4.  Follow up home visit scheduled for 01/27/16 at 11:00 AM.    [facility] CM Care Plan Problem One        Most Recent Value   Care Plan Problem One  Medication management   Role Documenting the Problem One  Clinical Pharmacist   Care Plan for Problem One  Active   THN Long Term Goal (31-90 days)   Patient will develop a system for self-managing her medications in the next 30 days.    THN Long Term Goal Start Date  01/20/16   Interventions for Problem One Long Term Goal  Patient currently uses a weekly pill box for her medications.  Had patient demonstrate filling the pill box and provided education when needed.  Discussed pharmacies that provide compliance packaging and patient reports she will consider switching over to a pharmacy that does compliance packaging.      [name], Pharm.D. Pharmacy Resident [place] [phone]

## 2016-01-21 NOTE — Patient Outreach (Signed)
[facility]) Care Management  01/21/2016  Krystal [name] 02-03-1936 [id number]   Care coordination:  I received a return phone call from Maudie Mercury at Dr. Liliane Channel office.  Kim reports that Dr. Dorris Fetch instructed patient to take Lantus 30 units daily but Dr. Dorris Fetch reports patient does not need to resume Victoza prior to endocrinology office visit on 02/03/16.    I made outreach call to patient to update her on this information.  I instructed patient to start taking Lantus 30 units daily.  I reviewed purpose, proper use, and adverse effects of insulin and reviewed signs/symptoms of hypoglycemia and appropriate hypoglycemia management with patient.  Patient voices understanding.    Plan:  Home visit as previously scheduled on 01/27/16 at 11:00 AM.   [name], Pharm.D. Pharmacy Resident [place] [phone]

## 2016-01-23 ENCOUNTER — Encounter: Payer: Self-pay | Admitting: Pharmacist

## 2016-01-24 DIAGNOSIS — I739 Peripheral vascular disease, unspecified: Secondary | ICD-10-CM | POA: Diagnosis not present

## 2016-01-24 DIAGNOSIS — M2012 Hallux valgus (acquired), left foot: Secondary | ICD-10-CM | POA: Diagnosis not present

## 2016-01-24 DIAGNOSIS — E1051 Type 1 diabetes mellitus with diabetic peripheral angiopathy without gangrene: Secondary | ICD-10-CM | POA: Diagnosis not present

## 2016-01-24 DIAGNOSIS — M2011 Hallux valgus (acquired), right foot: Secondary | ICD-10-CM | POA: Diagnosis not present

## 2016-01-24 DIAGNOSIS — L84 Corns and callosities: Secondary | ICD-10-CM | POA: Diagnosis not present

## 2016-01-24 DIAGNOSIS — L603 Nail dystrophy: Secondary | ICD-10-CM | POA: Diagnosis not present

## 2016-01-27 ENCOUNTER — Other Ambulatory Visit: Payer: Self-pay | Admitting: *Deleted

## 2016-01-27 ENCOUNTER — Ambulatory Visit: Payer: Self-pay | Admitting: Pharmacist

## 2016-01-27 DIAGNOSIS — I5041 Acute combined systolic (congestive) and diastolic (congestive) heart failure: Secondary | ICD-10-CM

## 2016-01-27 NOTE — Patient Outreach (Signed)
Brief Acute visit to patient with Krystal [name], Pharm-D  Subjective: "I have a headache" today. Patient verbalizes during visit she thinks she needs to find an assisted living facility as the apartment is not working out. Patient verbalizes she needs more help with housework, needs help with meals and medication management.  She does worry about cost of care but knows she "has to do something"  Objective: Patient dressed in gown, neatly groomed. Home cluttered but clean.  BP 140/62 mmHg  Pulse 72  Resp 20  Wt 195 lb (88.451 kg)  SpO2 96% Assessment: HF: patient is weighing daily, needs to log  Medication management: Pharmacist helping with med planner   Plan: Refer to [facility] CSW to assist with finding assisted living facility resources [place]. [name], RN, BSN, [place] [phone]

## 2016-01-28 ENCOUNTER — Other Ambulatory Visit: Payer: Self-pay

## 2016-01-28 NOTE — Patient Outreach (Signed)
01/27/16  Subjective  I am seeing [name] for Krystal [name], PharmD who cannot see [name] today.  Patient reports she currently fills her prescriptions with [facility] in [place]. Patient reports she uses their delivery service to have her medications delivered to her home. Patient reports she also gets samples of several of her medications from her doctor's office including Uloric, Zetia, Crestor, Bystolic, and Ranexa. I am here to fill her pill box today.  Objective Outpatient Encounter Prescriptions as of 01/28/2016  Medication Sig  . aspirin 81 MG EC tablet Take 81 mg by mouth every morning.   . calcitRIOL (ROCALTROL) 0.25 MCG capsule Take 0.25 mcg by mouth daily. Reported on 01/20/2016  . clopidogrel (PLAVIX) 75 MG tablet Take 1 tablet (75 mg total) by mouth daily.  Marland Kitchen ezetimibe (ZETIA) 10 MG tablet Take 1 tablet (10 mg total) by mouth every morning.  . famotidine (PEPCID) 20 MG tablet Take 1 tablet (20 mg total) by mouth daily.  . febuxostat (ULORIC) 40 MG tablet Take 40 mg by mouth daily.  . ferrous sulfate 325 (65 FE) MG tablet Take 325 mg by mouth daily.   . Fluticasone-Salmeterol (ADVAIR DISKUS) 250-50 MCG/DOSE AEPB Inhale 1 puff into the lungs 2 (two) times daily.  Marland Kitchen glucose blood test strip Use as instructed 4 x daily. E11.65. One Touch Verio test Strips  . insulin aspart (NOVOLOG FLEXPEN) 100 UNIT/ML FlexPen Inject 8 Units into the skin 3 (three) times daily with meals. Reported on 12/06/2015  . isosorbide mononitrate (IMDUR) 60 MG 24 hr tablet Take 1 tablet (60 mg total) by mouth daily.  Marland Kitchen LANTUS SOLOSTAR 100 UNIT/ML Solostar Pen INJECT 40 UNITS IN THE MORNING. (Patient taking differently: INJECT 20 UNITS IN THE MORNING.)  . lisinopril (PRINIVIL,ZESTRIL) 10 MG tablet TAKE (1) TABLET BY MOUTH ONCE DAILY.  . nebivolol (BYSTOLIC) 5 MG tablet Take 1 tablet (5 mg total) by mouth 2 (two) times daily.  Marland Kitchen NITROSTAT 0.4 MG SL tablet PLACE ONE (1) TABLET UNDER TONGUE  EVERY 5 MINUTES UP TO (3) DOSES AS NEEDED FOR CHEST PAIN. (Patient taking differently: PLACE ONE (1) TABLET UNDER TONGUE EVERY 5 MINUTES UP TO (3) DOSES AS NEEDED FOR CHEST  used 2 weeks ago)  . polyethylene glycol (MIRALAX / GLYCOLAX) packet Take 17 g by mouth daily as needed for mild constipation.  . ranolazine (RANEXA) 500 MG 12 hr tablet Take 1 tablet (500 mg total) by mouth 2 (two) times daily.  . rosuvastatin (CRESTOR) 20 MG tablet Take 1 tablet (20 mg total) by mouth every evening.  . torsemide (DEMADEX) 20 MG tablet May take extra 20 mg daily for swelling   No facility-administered encounter medications on file as of 01/28/2016.   Assessment: Medication adherence:  [name] seems to be adherent to her medication over the last week.   Plan 1.  I filled [name]'s pill box for another week.  I used as many sample as I could to use them up.   2.  I will have Apolonio Schneiders follow up to set up her follow up visit for next week to fill up the next pill box.    Deanne Coffer, PharmD, [place] [phone]

## 2016-01-31 ENCOUNTER — Other Ambulatory Visit: Payer: Self-pay | Admitting: Pharmacist

## 2016-01-31 ENCOUNTER — Encounter: Payer: Self-pay | Admitting: Pharmacist

## 2016-01-31 NOTE — Patient Outreach (Signed)
[facility]) Care Management  [place]   01/31/2016  [name] 1936-07-20 [id number]  Subjective: Krystal [name] is a 80yo who was referred to [place] for medication management.  I made follow up home visit today.  Patient continues to fill her prescriptions at [facility] in [place] and uses their delivery service.  Patient had refills of all of her medications today.  Patient also gets samples of several of her medications from her doctor's office including Uloric, Zetia, Crestor, Bystolic, and Ranexa.  Patient reports adherence with her medications including Lantus 30 units daily and Novolog 8 units with meals.      Objective:   Current Medications: Current Outpatient Prescriptions  Medication Sig Dispense Refill  . aspirin 81 MG EC tablet Take 81 mg by mouth every morning.     . calcitRIOL (ROCALTROL) 0.25 MCG capsule Take 0.25 mcg by mouth daily. Reported on 01/20/2016    . clopidogrel (PLAVIX) 75 MG tablet Take 1 tablet (75 mg total) by mouth daily. 90 tablet 3  . ezetimibe (ZETIA) 10 MG tablet Take 1 tablet (10 mg total) by mouth every morning. 90 tablet 3  . famotidine (PEPCID) 20 MG tablet Take 1 tablet (20 mg total) by mouth daily. 30 tablet 1  . febuxostat (ULORIC) 40 MG tablet Take 40 mg by mouth daily.    . ferrous sulfate 325 (65 FE) MG tablet Take 325 mg by mouth daily.     . Fluticasone-Salmeterol (ADVAIR DISKUS) 250-50 MCG/DOSE AEPB Inhale 1 puff into the lungs 2 (two) times daily. 60 each 1  . glucose blood test strip Use as instructed 4 x daily. E11.65. One Touch Verio test Strips 150 each 5  . insulin aspart (NOVOLOG FLEXPEN) 100 UNIT/ML FlexPen Inject 8 Units into the skin 3 (three) times daily with meals. Reported on 12/06/2015    . isosorbide mononitrate (IMDUR) 60 MG 24 hr tablet Take 1 tablet (60 mg total) by mouth daily. 90 tablet 3  . LANTUS SOLOSTAR 100 UNIT/ML Solostar Pen INJECT 40 UNITS IN THE MORNING. (Patient taking  differently: INJECT 30 UNITS IN THE MORNING.) 15 mL 2  . lisinopril (PRINIVIL,ZESTRIL) 10 MG tablet TAKE (1) TABLET BY MOUTH ONCE DAILY. 90 tablet 3  . nebivolol (BYSTOLIC) 5 MG tablet Take 1 tablet (5 mg total) by mouth 2 (two) times daily. 180 tablet 1  . NITROSTAT 0.4 MG SL tablet PLACE ONE (1) TABLET UNDER TONGUE EVERY 5 MINUTES UP TO (3) DOSES AS NEEDED FOR CHEST PAIN. (Patient taking differently: PLACE ONE (1) TABLET UNDER TONGUE EVERY 5 MINUTES UP TO (3) DOSES AS NEEDED FOR CHEST  used 2 weeks ago) 25 tablet 0  . polyethylene glycol (MIRALAX / GLYCOLAX) packet Take 17 g by mouth daily as needed for mild constipation. 30 each 0  . ranolazine (RANEXA) 500 MG 12 hr tablet Take 1 tablet (500 mg total) by mouth 2 (two) times daily. 60 tablet 3  . rosuvastatin (CRESTOR) 20 MG tablet Take 1 tablet (20 mg total) by mouth every evening. 30 tablet 1  . torsemide (DEMADEX) 20 MG tablet May take extra 20 mg daily for swelling 90 tablet 3   No current facility-administered medications for this visit.   Functional Status: In your present state of health, do you have any difficulty performing the following activities: 12/06/2015 11/08/2015  Hearing? N N  Vision? N N  Difficulty concentrating or making decisions? N N  Walking or climbing stairs? Aggie Moats  Dressing or bathing? Y N  Doing errands, shopping? N N  Preparing Food and eating ? N -  Using the Toilet? N -  In the past six months, have you accidently leaked urine? Y -  Do you have problems with loss of bowel control? N -  Managing your Medications? Y -  Managing your Finances? N -   Fall/Depression Screening: PHQ 2/9 Scores 01/20/2016 12/06/2015 10/26/2015 06/24/2015  PHQ - 2 Score 0 0 0 0   Assessment: 1.  Medication management:  Patient currently uses a weekly pill box with morning and evening slots.  Patient appears to be adherent with her medications.  I provided her with a second weekly medication planner and assisted patient in filling her  pill boxes for the next two weeks.  Patient did not have enough Uloric to fill the full two weeks.  I assisted patient in calling [facility] to request a refill of Uloric (patient was agreeable to pay copay at this time).  I place a small slip of paper with the medication name on it in the boxes that do not have the medication as a place hold for when the mediation is delivered by the pharmacy.    2.  Medication assistance:  Patient is on several brand name medications that are very expensive: Uloric, Advair, Novolog, Lantus, Bystolic, Ranexa.  Patient currently receives samples of Uloric, Ranexa, Bystolic, Crestor, and Zetia from her provider's office but reports difficulty affording these mediations sometimes.  I previously discussed Extra Help and patient does not qualify for Extra Help based on her income.  Today, I discussed patient assistance programs with her.  I made an outreach call to [place] to determine the amount patient had paid out of pocket on medications in this calendar year in order to complete applications.  I spoke with Nira Conn and determined that patient has two insurances Personnel officer and Licensed conveyancer part D).  When patient's medications are billed under both insurance plans, patient does not have a copay on many of her medications.  I determined that patient has zero copay for Zetia, Crestor, Novolog, Lantus, Advair, Bystolic.  I updated patient on this information.  Uloric is not covered by patient's insurance and therefore has a copay of approximately $75.  I assisted patient in completing patient portion of Uloric patient assistance application.  Will send referral to [place] Management Assistant Damita Rhodie to assist in completing remainder of application.     Heather at [facility] also reports that patient's test strips are not currently covered by her insurance.  Patient currently has a One Touch meter.  The preferred meter under [employer] is  Accu-chek.   Plan: 1.  I filled patient's pill boxes for the next two weeks.  I used as many samples as I could to use them up.  Patient did not have enough Uloric for the full two weeks.  I called [facility] and requested refill Uloric with patient's permission.  Uloric prescription will be delivered to patient.  Patient reports difficulty affording Uloric copay but was agreeable to pay copay today while we are working on getting Uloric patient assistance.   I place a small slip of paper with the medication name on it in the boxes that do not have the medication as a place hold for when the mediation is delivered by the pharmacy.  I advised patient to add Uloric to those days when it is delivered.   2.  Will send  in-basket to [place] to request assistance in completion of Uloric patient assistance application.   3.  I will send an in-basket to patient's endocrinologist to request a new prescription for an Accu-chek meter be sent to patient's pharmacy. 4.  Follow up home visit scheduled for 02/14/16 at 11:00 AM.    [name], Pharm.D. Pharmacy Resident [place] [phone]

## 2016-02-01 ENCOUNTER — Encounter: Payer: Self-pay | Admitting: Pharmacist

## 2016-02-01 ENCOUNTER — Encounter: Payer: Self-pay | Admitting: Licensed Clinical Social Worker

## 2016-02-01 ENCOUNTER — Other Ambulatory Visit: Payer: Self-pay | Admitting: Licensed Clinical Social Worker

## 2016-02-01 NOTE — Patient Outreach (Signed)
Assessment:  CSW received referral on Krystal [name].  CSW completed chart review on client on  02/01/16. CSW called client home phone number on 02/01/16. CSW spoke via phone with client on 02/01/16. CSW verified client identity. CSW and client spoke of client needs. CSW introduced self and informed client of support available to client through [facility] program. Client has been receiving [facility] nursing support. Client has spoken to RN Krystal [name] about client's need for information on community resources and need for information regarding possible Assisted Living Placement options. CSW informed client about Aging, disability and transient services agency in [place] and customary cost rates for in home care support through that agency.  CSW also spoke with client about [facility] facility in [place], [place] and that client could call that facility to inquire about customary monthly care rates at that facility.  Client said she needed help with caring for herself and managing her home. She said she had pain issues in her left knee. She said [facility] pharmacist had recently visited her and filled her medication box for the next week. She said she enjoys working with RN Krystal [name] related to nursing needs of client. CSW and client reviewed client assessments information.  Client has already completed [facility] consent form. Client and CSW spoke of client care plan. Client agreed to communicate with CSW in next 30 days to discuss level of care needs of client and facility placement options for client. CSW gave client CSW phone number of [phone] and invited her to call CSW as needed to speak of social work needs of client. CSW thanked client for phone call with CSW on 02/01/16.   Plan: Client to communicate with CSW in next 30 days regarding level of care needs of client and regarding facility placement options for client. CSW to call client in two weeks to assess  client needs.  Krystal [name].Krystal [name] MSW, LCSW Licensed Clinical Social Worker [facility] Care Management [phone]

## 2016-02-03 ENCOUNTER — Ambulatory Visit (INDEPENDENT_AMBULATORY_CARE_PROVIDER_SITE_OTHER): Payer: Medicare Other | Admitting: "Endocrinology

## 2016-02-03 VITALS — BP 157/52 | HR 74 | Ht 69.0 in | Wt 195.0 lb

## 2016-02-03 DIAGNOSIS — N183 Chronic kidney disease, stage 3 (moderate): Secondary | ICD-10-CM

## 2016-02-03 DIAGNOSIS — E1122 Type 2 diabetes mellitus with diabetic chronic kidney disease: Secondary | ICD-10-CM

## 2016-02-03 DIAGNOSIS — Z794 Long term (current) use of insulin: Secondary | ICD-10-CM | POA: Diagnosis not present

## 2016-02-03 DIAGNOSIS — E785 Hyperlipidemia, unspecified: Secondary | ICD-10-CM | POA: Diagnosis not present

## 2016-02-03 DIAGNOSIS — I10 Essential (primary) hypertension: Secondary | ICD-10-CM | POA: Diagnosis not present

## 2016-02-03 NOTE — Patient Instructions (Signed)
Advice for weight management -For most of us the best way to lose weight is by diet management. Generally speaking, diet management means restricting carbohydrate consumption to minimum possible (and to unprocessed or minimally processed complex starch) and increasing protein intake (animal or plant source), fruits, and vegetables.  -Sticking to a routine mealtime to eat 3 meals a day and avoiding unnecessary snacks is shown to have a big role in weight control.  -It is better to avoid simple carbohydrates including: Cakes, Desserts, Ice Cream, Soda (diet and regular), Sweet Tea, Candies, Chips, Cookies, Artificial Sweeteners, and "Sugar-free" Products.   -Exercise: 30 minutes a day 3-4 days a week, or 150 minutes a week. Combine stretch, strength, and aerobic activities. You may seek evaluation by your heart doctor prior to initiating exercise if you have high risk for heart disease.  -If you are interested, we can schedule a visit with Krystal [name], RDN, CDE for individualized nutrition education.  

## 2016-02-03 NOTE — Progress Notes (Signed)
Subjective:    Patient ID: Krystal [name], female    DOB: Aug 27, 1936, PCP Krystal Font, MD   Past Medical History  Diagnosis Date  . Sleep apnea     CPAP  . Myalgia   . Palpitations   . Microcytic anemia     History of occult blood in stool  . DJD (degenerative joint disease)   . CAD (coronary artery disease) 2009    5 stents- #3 in RCA, #1 each in LAD and AVG  . Osteoarthritis   . Essential hypertension   . Hyperlipidemia   . GERD (gastroesophageal reflux disease)   . Allergic rhinitis   . Personal history of colonic polyps   . HOH (hard of hearing)   . MI (myocardial infarction) ([place]) 1998  . Glaucoma   . Diastolic dysfunction, left ventricle   . CKD (chronic kidney disease) stage 3, GFR 30-59 ml/min   . Emphysema lung (HCC)     2L N/C continuously  . Complete heart block, transient 2014  . Gout   . Type II diabetes mellitus with nephropathy ([place])   . History of uterine cancer    Past Surgical History  Procedure Laterality Date  . Ectopic pregnancy surgery    . Foot surgery      Left and right for callous  . Cervical discectomy      L5 left/hemilminectomy  . Colonoscopy  09/2006    Int hemmorhoids, COMPLICATED BY CARDIOPULMONARY COMPLICATIONS  . Abdominal hysterectomy  04/2010    Uterine cancer,TAHBSO  . Polypectomy  10/22/2011    Internal hemorrhoids/sessile polyp  . Coronary angioplasty  1/99, 1/07, 1/08, 4/09    5 cardiac stents total  . Left and right heart catheterization with coronary angiogram N/A 09/12/2012    Procedure: LEFT AND RIGHT HEART CATHETERIZATION WITH CORONARY ANGIOGRAM;  Surgeon: Sanda Klein, MD;  Location: [facility] CATH LAB;  Service: Cardiovascular;  Laterality: N/A;   Social History   Social History  . Marital Status: Widowed    Spouse Name: N/A  . Number of Children: 0  . Years of Education: 12   Occupational History  .      retired   Social History Main Topics  . Smoking status: Former Smoker -- 0.40 packs/day for 51 years     Types: Cigarettes    Start date: 04/22/1955    Quit date: 10/18/2006  . Smokeless tobacco: Never Used     Comment: quit about 4 yrs ago  . Alcohol Use: No  . Drug Use: No  . Sexual Activity: No   Other Topics Concern  . Not on file   Social History Narrative   Consumes 2 cups of caffeine daily   Outpatient Encounter Prescriptions as of 02/03/2016  Medication Sig  . aspirin 81 MG EC tablet Take 81 mg by mouth every morning.   . calcitRIOL (ROCALTROL) 0.25 MCG capsule Take 0.25 mcg by mouth daily. Reported on 01/20/2016  . clopidogrel (PLAVIX) 75 MG tablet Take 1 tablet (75 mg total) by mouth daily.  Marland Kitchen ezetimibe (ZETIA) 10 MG tablet Take 1 tablet (10 mg total) by mouth every morning.  . famotidine (PEPCID) 20 MG tablet Take 1 tablet (20 mg total) by mouth daily.  . febuxostat (ULORIC) 40 MG tablet Take 40 mg by mouth daily.  . ferrous sulfate 325 (65 FE) MG tablet Take 325 mg by mouth daily.   . Fluticasone-Salmeterol (ADVAIR DISKUS) 250-50 MCG/DOSE AEPB Inhale 1 puff into the lungs 2 (  two) times daily.  Marland Kitchen glucose blood test strip Use as instructed 4 x daily. E11.65. One Touch Verio test Strips  . insulin aspart (NOVOLOG FLEXPEN) 100 UNIT/ML FlexPen Inject 8 Units into the skin 3 (three) times daily with meals. Reported on 12/06/2015  . isosorbide mononitrate (IMDUR) 60 MG 24 hr tablet Take 1 tablet (60 mg total) by mouth daily.  Marland Kitchen LANTUS SOLOSTAR 100 UNIT/ML Solostar Pen INJECT 40 UNITS IN THE MORNING. (Patient taking differently: INJECT 30 UNITS IN THE MORNING.)  . lisinopril (PRINIVIL,ZESTRIL) 10 MG tablet TAKE (1) TABLET BY MOUTH ONCE DAILY.  . nebivolol (BYSTOLIC) 5 MG tablet Take 1 tablet (5 mg total) by mouth 2 (two) times daily.  Marland Kitchen NITROSTAT 0.4 MG SL tablet PLACE ONE (1) TABLET UNDER TONGUE EVERY 5 MINUTES UP TO (3) DOSES AS NEEDED FOR CHEST PAIN. (Patient taking differently: PLACE ONE (1) TABLET UNDER TONGUE EVERY 5 MINUTES UP TO (3) DOSES AS NEEDED FOR CHEST  used 2 weeks ago)   . polyethylene glycol (MIRALAX / GLYCOLAX) packet Take 17 g by mouth daily as needed for mild constipation.  . ranolazine (RANEXA) 500 MG 12 hr tablet Take 1 tablet (500 mg total) by mouth 2 (two) times daily.  . rosuvastatin (CRESTOR) 20 MG tablet Take 1 tablet (20 mg total) by mouth every evening.  . torsemide (DEMADEX) 20 MG tablet May take extra 20 mg daily for swelling   No facility-administered encounter medications on file as of 02/03/2016.   ALLERGIES: Allergies  Allergen Reactions  . Morphine Shortness Of Breath and Swelling  . Penicillins Shortness Of Breath and Swelling    Has patient had a PCN reaction causing immediate rash, facial/tongue/throat swelling, SOB or lightheadedness with hypotension: No Has patient had a PCN reaction causing severe rash involving mucus membranes or skin necrosis: No Has patient had a PCN reaction that required hospitalization Yes Has patient had a PCN reaction occurring within the last 10 years: No If all of the above answers are "NO", then may proceed with Cephalosporin use.   . Shellfish Allergy    VACCINATION STATUS: Immunization History  Administered Date(s) Administered  . Influenza Whole 09/23/2007  . Influenza,inj,Quad PF,36+ Mos 08/20/2015  . Influenza-Unspecified 09/19/2013  . Pneumococcal Polysaccharide-23 08/20/2015    Diabetes She presents for her follow-up diabetic visit. She has type 2 diabetes mellitus. Onset time: She was diagnosed at approximate age of 74 years. Her disease course has been worsening. There are no hypoglycemic associated symptoms. Pertinent negatives for hypoglycemia include no confusion, headaches, pallor or seizures. Associated symptoms include fatigue, polydipsia and polyuria. Pertinent negatives for diabetes include no chest pain and no polyphagia. There are no hypoglycemic complications. Symptoms are worsening. Diabetic complications include heart disease, nephropathy, peripheral neuropathy and PVD. Risk  factors for coronary artery disease include dyslipidemia, diabetes mellitus, hypertension, obesity, sedentary lifestyle and tobacco exposure. Current diabetic treatment includes insulin injections. She is following a generally unhealthy diet. She has had a previous visit with a dietitian. She never participates in exercise. Her home blood glucose trend is fluctuating dramatically (He did brought her meter and log showing above target BG readings , monitoring 0-2 times a day.). An ACE inhibitor/angiotensin II receptor blocker is being taken.  Hyperlipidemia This is a chronic problem. The current episode started more than 1 year ago. Exacerbating diseases include diabetes and obesity. Pertinent negatives include no chest pain, myalgias or shortness of breath. Current antihyperlipidemic treatment includes bile acid squestrants. Risk factors for coronary artery disease include  diabetes mellitus, dyslipidemia, hypertension and obesity.  Hypertension This is a chronic problem. The current episode started more than 1 year ago. Pertinent negatives include no chest pain, headaches, palpitations or shortness of breath. Risk factors for coronary artery disease include diabetes mellitus, dyslipidemia, obesity, sedentary lifestyle and smoking/tobacco exposure. Past treatments include ACE inhibitors. Hypertensive end-organ damage includes kidney disease, CAD/MI and PVD.     Review of Systems  Constitutional: Positive for fatigue. Negative for unexpected weight change.  HENT: Negative for trouble swallowing and voice change.   Eyes: Negative for visual disturbance.  Respiratory: Negative for cough, shortness of breath and wheezing.   Cardiovascular: Negative for chest pain, palpitations and leg swelling.  Gastrointestinal: Negative for nausea, vomiting and diarrhea.  Endocrine: Positive for polydipsia and polyuria. Negative for cold intolerance, heat intolerance and polyphagia.  Musculoskeletal: Negative for  myalgias and arthralgias.  Skin: Negative for color change, pallor, rash and wound.  Neurological: Negative for seizures and headaches.  Psychiatric/Behavioral: Negative for suicidal ideas and confusion.    Objective:    BP 157/52 mmHg  Pulse 74  Ht 5\' 9"  (1.753 m)  Wt 195 lb (88.451 kg)  BMI 28.78 kg/m2  SpO2 96%  Wt Readings from Last 3 Encounters:  02/03/16 195 lb (88.451 kg)  01/27/16 195 lb (88.451 kg)  01/20/16 191 lb (86.637 kg)    Physical Exam  Constitutional: She is oriented to person, place, and time. She appears well-developed.  HENT:  Head: Normocephalic and atraumatic.  Eyes: EOM are normal.  Neck: Normal range of motion. Neck supple. No tracheal deviation present. No thyromegaly present.  Cardiovascular: Normal rate and regular rhythm.   Pulmonary/Chest: Effort normal and breath sounds normal.  Abdominal: Soft. Bowel sounds are normal. There is no tenderness. There is no guarding.  Musculoskeletal: Normal range of motion. She exhibits no edema.  Neurological: She is alert and oriented to person, place, and time. She has normal reflexes. No cranial nerve deficit. Coordination normal.  Skin: Skin is warm and dry. No rash noted. No erythema. No pallor.  Psychiatric:  She is significantly reluctant with possible cognitive deficit.     Complete Blood Count (Most recent): Lab Results  Component Value Date   WBC 8.3 11/08/2015   HGB 9.9* 11/08/2015   HCT 32.6* 11/08/2015   MCV 75.8* 11/08/2015   PLT 263 11/08/2015   Chemistry (most recent): Lab Results  Component Value Date   NA 137 01/12/2016   K 4.0 01/12/2016   CL 97* 01/12/2016   CO2 29 01/12/2016   BUN 35* 01/12/2016   CREATININE 2.23* 01/12/2016   Diabetic Labs (most recent): Lab Results  Component Value Date   HGBA1C 11.7* 01/12/2016   HGBA1C 10.7* 03/05/2014   HGBA1C 11.5* 12/18/2013   Lipid Panel     Component Value Date/Time   CHOL 191 12/18/2013 1058   TRIG 186* 12/18/2013 1058    HDL 39* 12/18/2013 1058   CHOLHDL 4.9 12/18/2013 1058   VLDL 37 12/18/2013 1058   LDLCALC 115* 12/18/2013 1058   LDLDIRECT 118.4 08/13/2013 1502     Assessment & Plan:   1. Uncontrolled type 2 diabetes mellitus with other specified complication ([place])  - Her  diabetes is  complicated by coronary artery disease, chronic kidney disease, peripheral neuropathy, and peripheral arterial disease and patient remains at a high risk for more acute and chronic complications of diabetes which include CAD, CVA, CKD, retinopathy, and neuropathy. These are all discussed in detail with the patient.  Patient came with above target ( rare and random monitoring)   glucose profile nor her meter, and her recent A1c is higher at 11.7 % up from  10.7%. Recent labs reviewed.   - I have re-counseled the patient on diet management  by adopting a carbohydrate restricted / protein rich  Diet.  - Suggestion is made for patient to avoid simple carbohydrates   from their diet including Cakes , Desserts, Ice Cream,  Soda (  diet and regular) , Sweet Tea , Candies,  Chips, Cookies, Artificial Sweeteners,   and "Sugar-free" Products .  This will help patient to have stable blood glucose profile and potentially avoid unintended  Weight gain.  - Patient is advised to stick to a routine mealtimes to eat 3 meals  a day and avoid unnecessary snacks ( to snack only to correct hypoglycemia).  - The patient  has been  scheduled with Krystal [name], Krystal [name], Krystal [name] for individualized DM education.  - I have approached patient with the following individualized plan to manage diabetes and patient agrees.  -Mrs. Blocher's care is becoming concerning and she may not be able to do the intensive insulin therapy.  she lives alone. - She may need placement in a skilled care facility. - There is a risk of insulin mix up in her case. I asked her to bring in all kinds of insulin she has at home. - will switch her to premixed insulin to use 2  times a day with breakfast and supper.  - In the meantime I advised her to continue basal insulin Lantus 30 units qhs, Novolog  8 units for  premeal BG between above 90 mg/dl  associated with strict monitoring of BG AC and HS.   -Patient is encouraged to call clinic for blood glucose levels less than 70 or above 300 mg /dl.  -She will be taken off of Victoza next visit.   - Patient specific target  for A1c; LDL, HDL, Triglycerides, and  Waist Circumference were discussed in detail.  2) BP/HTN: Uncontrolled, patient could not confirm if she is consistent taking her blood pressure medications. I advised her to resume her current medications including ACEI/ARB. 3) Lipids/HPL:  continue ezetimibe, continue Crestor 20 mg by mouth daily at bedtime.   4)  Weight/Diet: Krystal [name] consult in progress, exercise, and carbohydrates information provided.  5) Chronic Care/Health Maintenance:  -Patient  on ACEI/ARB and Statin medications and encouraged to continue to follow up with Ophthalmology, Podiatrist at least yearly or according to recommendations, and advised to  stay away from smoking. I have recommended yearly flu vaccine and pneumonia vaccination at least every 5 years; moderate intensity exercise for up to 150 minutes weekly; and  sleep for at least 7 hours a day.  - 25 minutes of time was spent on the care of this patient , 50% of which was applied for counseling on diabetes complications and their preventions.  - I advised patient to maintain close follow up with Krystal Font, MD for primary care needs.  Patient is asked to bring meter and  blood glucose logs during their next visit.   Follow up plan: -Return in about 2 weeks (around 02/17/2016) for diabetes, high blood pressure, high cholesterol, follow up with meter and logs- no labs.  Glade Lloyd, MD Phone: [phone]  Fax: [phone]   02/03/2016, 3:27 PM

## 2016-02-14 ENCOUNTER — Other Ambulatory Visit: Payer: Self-pay | Admitting: Endocrinology

## 2016-02-14 ENCOUNTER — Other Ambulatory Visit: Payer: Self-pay | Admitting: Pharmacist

## 2016-02-14 ENCOUNTER — Other Ambulatory Visit: Payer: Self-pay | Admitting: Cardiovascular Disease

## 2016-02-14 NOTE — Patient Outreach (Signed)
[facility]) Care Management  [place]   02/14/2016  Krystal [name] 08-06-1936 [id number]  Subjective: Krystal [name] is a 80yo who was referred to [place] for medication management.  I made follow up home visit today.  Patient continues to fill her prescriptions at [facility] in [place] and uses their delivery service.  Patient had all medications today during visit.  Patient also continued to get samples of several of her medications from her doctor's office including Uloric, Zetia, Crestor, Bystolic, and Ranexa.  Patient reports adherence with her medications including Lantus 30 units daily and Novolog 8 units with meals.  Patient had blood glucose log provided during her visit with Dr. Dorris Fetch and has been recording her blood glucose readings.  Patient confirms she plans to take blood glucose log, her meter, and her insulin to next visit with Dr. Dorris Fetch on 02/17/16.    Objective:  Blood glucose = 186 mg/dL   Current Medications: Current Outpatient Prescriptions  Medication Sig Dispense Refill  . aspirin 81 MG EC tablet Take 81 mg by mouth every morning.     . calcitRIOL (ROCALTROL) 0.25 MCG capsule Take 0.25 mcg by mouth daily. Reported on 01/20/2016    . clopidogrel (PLAVIX) 75 MG tablet Take 1 tablet (75 mg total) by mouth daily. 90 tablet 3  . ezetimibe (ZETIA) 10 MG tablet Take 1 tablet (10 mg total) by mouth every morning. 90 tablet 3  . famotidine (PEPCID) 20 MG tablet Take 1 tablet (20 mg total) by mouth daily. 30 tablet 1  . febuxostat (ULORIC) 40 MG tablet Take 40 mg by mouth daily.    . ferrous sulfate 325 (65 FE) MG tablet Take 325 mg by mouth daily.     . Fluticasone-Salmeterol (ADVAIR DISKUS) 250-50 MCG/DOSE AEPB Inhale 1 puff into the lungs 2 (two) times daily. 60 each 1  . glucose blood test strip Use as instructed 4 x daily. E11.65. One Touch Verio test Strips 150 each 5  . HYDROcodone-acetaminophen (NORCO/VICODIN) 5-325 MG tablet  Take 1 tablet by mouth every 12 (twelve) hours as needed for moderate pain.    Marland Kitchen insulin aspart (NOVOLOG FLEXPEN) 100 UNIT/ML FlexPen Inject 8 Units into the skin 3 (three) times daily with meals. Reported on 12/06/2015    . isosorbide mononitrate (IMDUR) 60 MG 24 hr tablet Take 1 tablet (60 mg total) by mouth daily. 90 tablet 3  . LANTUS SOLOSTAR 100 UNIT/ML Solostar Pen INJECT 40 UNITS IN THE MORNING. (Patient taking differently: INJECT 30 UNITS IN THE MORNING.) 15 mL 2  . lisinopril (PRINIVIL,ZESTRIL) 10 MG tablet TAKE (1) TABLET BY MOUTH ONCE DAILY. 90 tablet 3  . nebivolol (BYSTOLIC) 5 MG tablet Take 1 tablet (5 mg total) by mouth 2 (two) times daily. 180 tablet 1  . NITROSTAT 0.4 MG SL tablet PLACE ONE (1) TABLET UNDER TONGUE EVERY 5 MINUTES UP TO (3) DOSES AS NEEDED FOR CHEST PAIN. (Patient taking differently: PLACE ONE (1) TABLET UNDER TONGUE EVERY 5 MINUTES UP TO (3) DOSES AS NEEDED FOR CHEST  used 2 weeks ago) 25 tablet 0  . polyethylene glycol (MIRALAX / GLYCOLAX) packet Take 17 g by mouth daily as needed for mild constipation. 30 each 0  . ranolazine (RANEXA) 500 MG 12 hr tablet Take 1 tablet (500 mg total) by mouth 2 (two) times daily. 60 tablet 3  . rosuvastatin (CRESTOR) 20 MG tablet Take 1 tablet (20 mg total) by mouth every evening. 30 tablet  1  . torsemide (DEMADEX) 20 MG tablet May take extra 20 mg daily for swelling 90 tablet 3   No current facility-administered medications for this visit.   Functional Status: In your present state of health, do you have any difficulty performing the following activities: 02/01/2016 02/01/2016  Hearing? N N  Vision? Y N  Difficulty concentrating or making decisions? N N  Walking or climbing stairs? Y Y  Dressing or bathing? Y Y  Doing errands, shopping? N N  Preparing Food and eating ? Y N  Using the Toilet? N N  In the past six months, have you accidently leaked urine? Y Y  Do you have problems with loss of bowel control? N N  Managing  your Medications? Y Y  Managing your Finances? N N  Housekeeping or managing your Housekeeping? [name]   Fall/Depression Screening: PHQ 2/9 Scores 02/01/2016 01/20/2016 12/06/2015 10/26/2015 06/24/2015  PHQ - 2 Score 0 0 0 0 0    Assessment: 1.  Medication management:  Patient currently uses a weekly pill box to organize her medications.  Patient appears to be adherent with her medications.  Patient has missed one full day of medications on Saturday (reports she was not home) and missed one additional evening dose of her medications.  I showed patient how she can take each day out of the weekly planner and encouraged patient to take her medications with her if she is going to be gone for the day.  Patient voiced understanding.  I provided patient with a second weekly medication planner and assisted patient in filling her pill boxes.  Patient only had enough medications to fill the pill boxes for the next 10 days.  I also assisted patient in calling her pharmacy to request refills of the following medications: clopidogrel, torsemide, famotidine, ezetimibe, ferrous sulfate, calcitriol, lisinopril, Ranexa, and isosorbide mononitrate.  Kathlee Nations at [facility] reports patient will get a phone call once the prescriptions are filled to inform her of medication price and to set a delivery date.  I reminded Kathlee Nations that patient has two insurance plans [employer] and Federal part D) and requested for the medications to be ran under both plans.    2.  Medication assistance:  Application for Uloric patient assistance program through [facility]ita Buffy was submitted by [place] Management Assistant Damita Rhodie on 02/08/16.  I called [employer] patient assistance program today to follow up on status of patient's application.  I spoke to [place] who confirmed that application was received and that application was missing the Uloric strength.  Dan Europe reports a strength can be given verbally over the phone by the provider's office.  I called  [facility] and spoke to Sam to request for the Uloric strength be called in to the [place] patient assistance program [phone]).    Patient reports she is currently working with Dr. Liliane Channel office to get a new glucometer that will be covered by her insurance plan.  She reports she plans to take her current glucometer to her office visit on 02/17/16.    Plan: 1. I filled patient's pill box for the next 10 days.  I used as many samples as I could to use them up.  Patient did not have enough medication to fill a full two weeks.  Patient and I called [place] during the visit and requested refills of several of patient's medications.  Advised patient to call me beginning of next week if she has not heard from [facility]armacy regarding medication delivery.   2.  Follow up home visit scheduled for 02/22/16 at 11:00 AM.    [facility] CM Care Plan Problem One        Most Recent Value   Care Plan Problem One  Medication management   Role Documenting the Problem One  Clinical Pharmacist   Care Plan for Problem One  Active   THN Long Term Goal (31-90 days)  Patient will develop a system for self-managing her medications in the next 30 days.    THN Long Term Goal Start Date  01/20/16   Interventions for Problem One Long Term Goal  Patient currently uses a weekly pill box for her medications.  I provided patient with a second weekly pill box.  Patient had organized her medications in one basket.  I assisted patient in calling [place] for medication refills and filling her weekly pill box.  Patient had enough medications to fill her pill box for the next 10 days.       [name], Pharm.D. Pharmacy Resident [place] [phone]

## 2016-02-15 ENCOUNTER — Other Ambulatory Visit: Payer: Self-pay | Admitting: Licensed Clinical Social Worker

## 2016-02-15 NOTE — Patient Outreach (Signed)
Assessment:  CSW called phone contact number of client on 02/15/16. CSW spoke via phone with client. CSW verified client identity. CSW and client spoke of client needs.  Client said she has pain issues.  She said she has talked with Dr. Berdine Addison about her pain issues. She said she does take a prescribed pain medication.  Client said she is eating adequately. She said she is not sleeping well.  She said she has sleep apnea. She has Bi-Pap machine to help her with sleep apnea.  She said that her medications are expensive to her.  [facility] pharmacist, [name] has been providing pharmacy support from [facility] program for client.  She gets her prescribed medications through [facility] in [place], [place].  She said it is more difficult to do her activities of daily living. She said she fatigues easily. She said she takes rest breaks as needed when she is fatigued. She said she has no family support.  She said she does not know her neighbors very well. She said She has [employer]. Client said she has a friend who helps client  go to and from her scheduled medical appointments. Client said she had a scheduled medical appointment with Dr. Berdine Addison last week.  CSW informed client of [facility] in [place], [place] gave client the phone number of that agency [phone]). CSW also gave client the phone number of [facility] facility in [place], [place] [phone]).  CSW spoke with client about [facility] facility.  CSW spoke with client about current care needs of client. CSW spoke with client about support available through [place] encouraged client to call [facility] to inquire about in home care support and customary costs for such support through that agency. CSW also encouraged client to call [facility] facility to speak with representative of that facility about customary monthly costs for occupants at facility. CSW spoke  with client about Medicaid application process and documents she would need to collect to take to a Medicaid application appointment at [place]. CSW also informed client of food pantry support through [facility] agency in [place], [place] and through [facility] in [place], [place].   Client said she needed help with activities of daily living. CSW encouraged client to continue to communicate with CSW in next 30 days to discuss client care needs and to discuss facility care options for client in the area. CSW thanked client for phone call with CSW on 02/15/16.     Plan: Client to continue to communicate as needed in the next 30 days with CSW to discuss client care options and facility placement options of client. CSW to call client in 4 weeks to assess client needs.   Norva Riffle.Laylaa Guevarra MSW, LCSW Licensed Clinical Social Worker [facility] Care Management [phone]

## 2016-02-17 ENCOUNTER — Ambulatory Visit: Payer: Medicare Other | Admitting: "Endocrinology

## 2016-02-20 ENCOUNTER — Other Ambulatory Visit: Payer: Self-pay | Admitting: Pharmacist

## 2016-02-20 NOTE — Patient Outreach (Signed)
[facility]) Care Management  02/20/2016  Krystal [name] 1936-06-07 [id number]   Krystal [name] is a 80yo who was referred to [place] for medication management.  I received voicemail from patient who called to inform me her prescription refills were delivered by [facility] pharmacy the same day we ordered them (02/14/16).  I made outreach to [facility] patient assistance program to check on the status of patient's application.  I spoke to [facility] who reported that patient was approved for Uloric patient assistance and medication was mailed to patient on 02/16/16.  I updated patient that Uloric should be arriving in the mail soon.  Patient voiced understanding.   Plan:  Home visit on 02/22/16 at 11:00 AM.     [name], Pharm.D. Pharmacy Resident [place] [phone]

## 2016-02-22 ENCOUNTER — Other Ambulatory Visit: Payer: Self-pay | Admitting: Pharmacist

## 2016-02-22 NOTE — Patient Outreach (Signed)
[name]) Care Management  [place]   02/22/2016  Krystal [name] [birth year] [id number]  Subjective: Krystal [name] is a 80yo who was referred to [name] for medication management.  I made follow up home visit today.  Patient continues to [facility] her prescriptions at [name] in [name] and uses their delivery service.  Patient had all medications today during visit.  Patient received a 90-day supply of Uloric in the mail from the patient assistance program.  Patient also has several samples of medications from her doctor's offices including Uloric, Zetia, Crestor, and Bystolic.  Patient reports adherence with her medications including Lantus and Novolog.  Patient had blood glucose log provided during her visit with [name] and has been recording her blood glucose readings.  Patient reports she had to reschedule endocrinology appointment and has appointment with [name] tomorrow.  Patient confirms she plans to take her blood glucose log, her meter and all of her insulin to the visit.    Objective:   Encounter Medications: Outpatient Encounter Prescriptions as of 02/22/2016  Medication Sig Note  . aspirin 81 MG EC tablet Take 81 mg by mouth every morning.    . calcitRIOL (ROCALTROL) 0.25 MCG capsule Take 0.25 mcg by mouth daily. Reported on 01/20/2016   . clopidogrel (PLAVIX) 75 MG tablet Take 1 tablet (75 mg total) by mouth daily.   Marland Kitchen ezetimibe (ZETIA) 10 MG tablet Take 1 tablet (10 mg total) by mouth every morning.   . famotidine (PEPCID) 20 MG tablet Take 1 tablet (20 mg total) by mouth daily.   . febuxostat (ULORIC) 40 MG tablet Take 40 mg by mouth daily.   . ferrous sulfate 325 (65 FE) MG tablet Take 325 mg by mouth daily.    . Fluticasone-Salmeterol (ADVAIR DISKUS) 250-50 MCG/DOSE AEPB Inhale 1 puff into the lungs 2 (two) times daily.   Marland Kitchen glucose blood test strip Use as instructed 4 x daily. E11.65. One Touch Verio test Strips   .  HYDROcodone-acetaminophen (NORCO/VICODIN) 5-325 MG tablet Take 1 tablet by mouth every 12 (twelve) hours as needed for moderate pain.   Marland Kitchen insulin aspart (NOVOLOG FLEXPEN) 100 UNIT/ML FlexPen Inject 8 Units into the skin 3 (three) times daily with meals. Reported on 12/06/2015   . isosorbide mononitrate (IMDUR) 60 MG 24 hr tablet Take 1 tablet (60 mg total) by mouth daily.   Marland Kitchen LANTUS SOLOSTAR 100 UNIT/ML Solostar Pen INJECT 40 UNITS IN THE MORNING. (Patient taking differently: INJECT 30 UNITS IN THE MORNING.)   . lisinopril (PRINIVIL,ZESTRIL) 10 MG tablet TAKE (1) TABLET BY MOUTH ONCE DAILY.   . nebivolol (BYSTOLIC) 5 MG tablet Take 1 tablet (5 mg total) by mouth 2 (two) times daily.   Marland Kitchen NITROSTAT 0.4 MG SL tablet PLACE ONE (1) TABLET UNDER TONGUE EVERY 5 MINUTES UP TO (3) DOSES AS NEEDED FOR CHEST PAIN. (Patient taking differently: PLACE ONE (1) TABLET UNDER TONGUE EVERY 5 MINUTES UP TO (3) DOSES AS NEEDED FOR CHEST  used 2 weeks ago) 02/22/2016: Has on hand if needed  . polyethylene glycol (MIRALAX / GLYCOLAX) packet Take 17 g by mouth daily as needed for mild constipation.   Marland Kitchen RANEXA 500 MG 12 hr tablet TAKE (1) TABLET TWICE DAILY.   . rosuvastatin (CRESTOR) 20 MG tablet Take 1 tablet (20 mg total) by mouth every evening.   . torsemide (DEMADEX) 20 MG tablet May take extra 20 mg daily for swelling    No facility-administered  encounter medications on file as of 02/22/2016.    Functional Status: In your present state of health, do you have any difficulty performing the following activities: 02/01/2016 02/01/2016  Hearing? N N  Vision? Y N  Difficulty concentrating or making decisions? N N  Walking or climbing stairs? Y Y  Dressing or bathing? Y Y  Doing errands, shopping? N N  Preparing Food and eating ? Y N  Using the Toilet? N N  In the past six months, have you accidently leaked urine? Y Y  Do you have problems with loss of bowel control? N N  Managing your Medications? Y Y  Managing your  Finances? N N  Housekeeping or managing your Housekeeping? [name]    Fall/Depression Screening: [place] [name] 2/9 Scores 02/01/2016 01/20/2016 12/06/2015 10/26/2015 06/24/2015  [name] - 2 Score 0 0 0 0 0    Assessment: 1.  Medication management:  Patient currently uses a weekly pill box to organize her medications.  Patient is adherent with her medications (has missed one evening dose of medications in the past 10 days).  I assisted patient in filling her pill boxes.  Patient had enough of all medications to fill pill boxes for the next three weeks except for calcitriol.  Calcitriol refill was requested from [place] [name] at my last visit but was not delivered with patient's medications.  Patient and I called [name] during visit and requested refill of calcitrol but no refill was available.  Patient and I called [name] to request refill of calcitrol.  I was informed patient missed her last appointment in May of 2016, and further refills cannot be provided until patient is seen in clinic.  The nurse suggested refills be obtained through primary care provider until patient can be seen.  Patient scheduled appointment at [name] for first available appointment on 04/18/16.  Patient and I called primary care office and left a message on the nursing line to request refill of calcitriol.    2.  Medication assistance:  Patient applied for Uloric patient assistance program and was approved.  Patient received a 90-day supply of Uloric in the mail from the patient assistance program.  I counseled patient on how to order Uloric refills through patient assistance program.  Patient confirms she is able to afford all other medications.    Plan: 1.  I filled patient's pill box for the next 3 weeks.  I used as many samples as I could to use them up.  Patient did not have enough calcitriol for the full three weeks.  Calcitriol refill requested from patient's primary care office.  I  placed a small slip of paper with the medication name on it in the boxes that do not have the medication as a place holder for when the medication is delivered by the pharmacy.  I advised patient to add calcitriol to those days when it is delivered.   2.  Follow up home visit scheduled for 03/14/16.    THN CM Care Plan Problem One        Most Recent Value   Care Plan Problem One  Medication management   Role Documenting the Problem One  Clinical Pharmacist   Care Plan for Problem One  Active   THN Long Term Goal (31-90 days)  Patient will develop a system for self-managing her medications in the next 30 days.    THN Long Term Goal Start Date  01/20/16 [goal renewed]   Interventions  for Problem One Long Term Goal  Patient continues to use weekly pill boxes for her medications.  Patient has developed a system for organizing her medication bottles.  Patient had all medications at this time but only had a one week supply of calcitriol.  [place] to request refill.  Also discussed refill with patient's nephrology office.  Assisted patient in refilling pill boxes for the next 3 weeks.  Added slip of paper to boxes that did not have calcitriol to remind patient to add calcitriol when refill is received.      [name], Pharm.D. Pharmacy Resident [place] [phone]

## 2016-02-23 ENCOUNTER — Ambulatory Visit (INDEPENDENT_AMBULATORY_CARE_PROVIDER_SITE_OTHER): Payer: Medicare Other | Admitting: "Endocrinology

## 2016-02-23 ENCOUNTER — Other Ambulatory Visit: Payer: Self-pay | Admitting: Pharmacist

## 2016-02-23 ENCOUNTER — Encounter: Payer: Self-pay | Admitting: "Endocrinology

## 2016-02-23 VITALS — BP 142/82 | HR 68 | Ht 68.0 in | Wt 198.0 lb

## 2016-02-23 DIAGNOSIS — I10 Essential (primary) hypertension: Secondary | ICD-10-CM | POA: Diagnosis not present

## 2016-02-23 DIAGNOSIS — N183 Chronic kidney disease, stage 3 unspecified: Secondary | ICD-10-CM

## 2016-02-23 DIAGNOSIS — E785 Hyperlipidemia, unspecified: Secondary | ICD-10-CM | POA: Diagnosis not present

## 2016-02-23 DIAGNOSIS — Z794 Long term (current) use of insulin: Secondary | ICD-10-CM | POA: Diagnosis not present

## 2016-02-23 DIAGNOSIS — E1122 Type 2 diabetes mellitus with diabetic chronic kidney disease: Secondary | ICD-10-CM

## 2016-02-23 MED ORDER — INSULIN ASPART PROT & ASPART (70-30 MIX) 100 UNIT/ML PEN: 20.0000 [IU] | PEN_INJECTOR | Freq: Two times a day (BID) | SUBCUTANEOUS | Status: DC

## 2016-02-23 MED ORDER — INSULIN PEN NEEDLE 31G X 8 MM MISC: 1.0000 | Status: AC

## 2016-02-23 MED ORDER — INSULIN ASPART PROT & ASPART (70-30 MIX) 100 UNIT/ML ~~LOC~~ SUSP: 20.0000 [IU] | Freq: Two times a day (BID) | SUBCUTANEOUS | Status: DC

## 2016-02-23 NOTE — Progress Notes (Signed)
Subjective:    Patient ID: Krystal [name], female    DOB: 08-30-1936, PCP Krystal Font, MD   Past Medical History  Diagnosis Date  . Sleep apnea     CPAP  . Myalgia   . Palpitations   . Microcytic anemia     History of occult blood in stool  . DJD (degenerative joint disease)   . CAD (coronary artery disease) 2009    5 stents- #3 in RCA, #1 each in LAD and AVG  . Osteoarthritis   . Essential hypertension   . Hyperlipidemia   . GERD (gastroesophageal reflux disease)   . Allergic rhinitis   . Personal history of colonic polyps   . HOH (hard of hearing)   . MI (myocardial infarction) ([place]) 1998  . Glaucoma   . Diastolic dysfunction, left ventricle   . CKD (chronic kidney disease) stage 3, GFR 30-59 ml/min   . Emphysema lung (HCC)     2L N/C continuously  . Complete heart block, transient 2014  . Gout   . Type II diabetes mellitus with nephropathy ([place])   . History of uterine cancer    Past Surgical History  Procedure Laterality Date  . Ectopic pregnancy surgery    . Foot surgery      Left and right for callous  . Cervical discectomy      L5 left/hemilminectomy  . Colonoscopy  09/2006    Int hemmorhoids, COMPLICATED BY CARDIOPULMONARY COMPLICATIONS  . Abdominal hysterectomy  04/2010    Uterine cancer,TAHBSO  . Polypectomy  10/22/2011    Internal hemorrhoids/sessile polyp  . Coronary angioplasty  1/99, 1/07, 1/08, 4/09    5 cardiac stents total  . Left and right heart catheterization with coronary angiogram N/A 09/12/2012    Procedure: LEFT AND RIGHT HEART CATHETERIZATION WITH CORONARY ANGIOGRAM;  Surgeon: Sanda Klein, MD;  Location: [facility] CATH LAB;  Service: Cardiovascular;  Laterality: N/A;   Social History   Social History  . Marital Status: Widowed    Spouse Name: N/A  . Number of Children: 0  . Years of Education: 12   Occupational History  .      retired   Social History Main Topics  . Smoking status: Former Smoker -- 0.40 packs/day for 51 years     Types: Cigarettes    Start date: 04/22/1955    Quit date: 10/18/2006  . Smokeless tobacco: Never Used     Comment: quit about 4 yrs ago  . Alcohol Use: No  . Drug Use: No  . Sexual Activity: No   Other Topics Concern  . None   Social History Narrative   Consumes 2 cups of caffeine daily   Outpatient Encounter Prescriptions as of 02/23/2016  Medication Sig  . aspirin 81 MG EC tablet Take 81 mg by mouth every morning.   . calcitRIOL (ROCALTROL) 0.25 MCG capsule Take 0.25 mcg by mouth daily. Reported on 01/20/2016  . clopidogrel (PLAVIX) 75 MG tablet Take 1 tablet (75 mg total) by mouth daily.  Marland Kitchen ezetimibe (ZETIA) 10 MG tablet Take 1 tablet (10 mg total) by mouth every morning.  . famotidine (PEPCID) 20 MG tablet Take 1 tablet (20 mg total) by mouth daily.  . febuxostat (ULORIC) 40 MG tablet Take 40 mg by mouth daily.  . ferrous sulfate 325 (65 FE) MG tablet Take 325 mg by mouth daily.   . Fluticasone-Salmeterol (ADVAIR DISKUS) 250-50 MCG/DOSE AEPB Inhale 1 puff into the lungs 2 (two) times  daily.  . glucose blood test strip Use as instructed 4 x daily. E11.65. One Touch Verio test Strips  . HYDROcodone-acetaminophen (NORCO/VICODIN) 5-325 MG tablet Take 1 tablet by mouth every 12 (twelve) hours as needed for moderate pain.  Marland Kitchen insulin aspart protamine - aspart (NOVOLOG MIX 70/30 FLEXPEN) (70-30) 100 UNIT/ML FlexPen Inject 0.2 mLs (20 Units total) into the skin 2 (two) times daily with a meal.  . Insulin Pen Needle (B-D ULTRAFINE III SHORT PEN) 31G X 8 MM MISC 1 each by Does not apply route as directed.  . isosorbide mononitrate (IMDUR) 60 MG 24 hr tablet Take 1 tablet (60 mg total) by mouth daily.  Marland Kitchen lisinopril (PRINIVIL,ZESTRIL) 10 MG tablet TAKE (1) TABLET BY MOUTH ONCE DAILY.  . nebivolol (BYSTOLIC) 5 MG tablet Take 1 tablet (5 mg total) by mouth 2 (two) times daily.  Marland Kitchen NITROSTAT 0.4 MG SL tablet PLACE ONE (1) TABLET UNDER TONGUE EVERY 5 MINUTES UP TO (3) DOSES AS NEEDED FOR CHEST  PAIN. (Patient taking differently: PLACE ONE (1) TABLET UNDER TONGUE EVERY 5 MINUTES UP TO (3) DOSES AS NEEDED FOR CHEST  used 2 weeks ago)  . polyethylene glycol (MIRALAX / GLYCOLAX) packet Take 17 g by mouth daily as needed for mild constipation.  Marland Kitchen RANEXA 500 MG 12 hr tablet TAKE (1) TABLET TWICE DAILY.  . rosuvastatin (CRESTOR) 20 MG tablet Take 1 tablet (20 mg total) by mouth every evening.  . torsemide (DEMADEX) 20 MG tablet May take extra 20 mg daily for swelling  . [DISCONTINUED] insulin aspart (NOVOLOG FLEXPEN) 100 UNIT/ML FlexPen Inject 8 Units into the skin 3 (three) times daily with meals. Reported on 12/06/2015  . [DISCONTINUED] insulin aspart protamine- aspart (NOVOLOG MIX 70/30) (70-30) 100 UNIT/ML injection Inject 0.2 mLs (20 Units total) into the skin 2 (two) times daily with a meal.  . [DISCONTINUED] LANTUS SOLOSTAR 100 UNIT/ML Solostar Pen INJECT 40 UNITS IN THE MORNING. (Patient taking differently: INJECT 30 UNITS IN THE MORNING.)   No facility-administered encounter medications on file as of 02/23/2016.   ALLERGIES: Allergies  Allergen Reactions  . Morphine Shortness Of Breath and Swelling  . Penicillins Shortness Of Breath and Swelling    Has patient had a PCN reaction causing immediate rash, facial/tongue/throat swelling, SOB or lightheadedness with hypotension: No Has patient had a PCN reaction causing severe rash involving mucus membranes or skin necrosis: No Has patient had a PCN reaction that required hospitalization Yes Has patient had a PCN reaction occurring within the last 10 years: No If all of the above answers are "NO", then may proceed with Cephalosporin use.   . Shellfish Allergy    VACCINATION STATUS: Immunization History  Administered Date(s) Administered  . Influenza Whole 09/23/2007  . Influenza,inj,Quad PF,36+ Mos 08/20/2015  . Influenza-Unspecified 09/19/2013  . Pneumococcal Polysaccharide-23 08/20/2015    Diabetes She presents for her  follow-up diabetic visit. She has type 2 diabetes mellitus. Onset time: She was diagnosed at approximate age of 59 years. Her disease course has been worsening. There are no hypoglycemic associated symptoms. Pertinent negatives for hypoglycemia include no confusion, headaches, pallor or seizures. Associated symptoms include fatigue, polydipsia and polyuria. Pertinent negatives for diabetes include no chest pain and no polyphagia. There are no hypoglycemic complications. Symptoms are worsening. Diabetic complications include heart disease, nephropathy, peripheral neuropathy and PVD. Risk factors for coronary artery disease include dyslipidemia, diabetes mellitus, hypertension, obesity, sedentary lifestyle and tobacco exposure. Current diabetic treatment includes insulin injections (She was asked to bring  all of her insulin supplies at home. She managed to bring to bags of various kinds of insulin mainly duplicates of long-acting, premixed, and rapid acting insulin types. At least half of the insulin pens she brought in are expired .). She is following a generally unhealthy diet. She has had a previous visit with a dietitian. She never participates in exercise. Her home blood glucose trend is fluctuating dramatically (He did brought her meter and log showing above target BG readings , monitoring 2-3 times a day, E AG between 100-170.). Her breakfast blood glucose range is generally 140-180 mg/dl. Her overall blood glucose range is 140-180 mg/dl. An ACE inhibitor/angiotensin II receptor blocker is being taken.  Hyperlipidemia This is a chronic problem. The current episode started more than 1 year ago. Exacerbating diseases include diabetes and obesity. Pertinent negatives include no chest pain, myalgias or shortness of breath. Current antihyperlipidemic treatment includes bile acid squestrants. Risk factors for coronary artery disease include diabetes mellitus, dyslipidemia, hypertension and obesity.   Hypertension This is a chronic problem. The current episode started more than 1 year ago. Pertinent negatives include no chest pain, headaches, palpitations or shortness of breath. Risk factors for coronary artery disease include diabetes mellitus, dyslipidemia, obesity, sedentary lifestyle and smoking/tobacco exposure. Past treatments include ACE inhibitors. Hypertensive end-organ damage includes kidney disease, CAD/MI and PVD.     Review of Systems  Constitutional: Positive for fatigue. Negative for unexpected weight change.  HENT: Negative for trouble swallowing and voice change.   Eyes: Negative for visual disturbance.  Respiratory: Negative for cough, shortness of breath and wheezing.   Cardiovascular: Negative for chest pain, palpitations and leg swelling.  Gastrointestinal: Negative for nausea, vomiting and diarrhea.  Endocrine: Positive for polydipsia and polyuria. Negative for cold intolerance, heat intolerance and polyphagia.  Musculoskeletal: Negative for myalgias and arthralgias.  Skin: Negative for color change, pallor, rash and wound.  Neurological: Negative for seizures and headaches.  Psychiatric/Behavioral: Negative for suicidal ideas and confusion.    Objective:    BP 142/82 mmHg  Pulse 68  Ht 5\' 8"  (1.727 m)  Wt 198 lb (89.812 kg)  BMI 30.11 kg/m2  SpO2 98%  Wt Readings from Last 3 Encounters:  02/23/16 198 lb (89.812 kg)  02/03/16 195 lb (88.451 kg)  01/27/16 195 lb (88.451 kg)    Physical Exam  Constitutional: She is oriented to person, place, and time. She appears well-developed.  HENT:  Head: Normocephalic and atraumatic.  Eyes: EOM are normal.  Neck: Normal range of motion. Neck supple. No tracheal deviation present. No thyromegaly present.  Cardiovascular: Normal rate and regular rhythm.   Pulmonary/Chest: Effort normal and breath sounds normal.  Abdominal: Soft. Bowel sounds are normal. There is no tenderness. There is no guarding.   Musculoskeletal: Normal range of motion. She exhibits no edema.  Neurological: She is alert and oriented to person, place, and time. She has normal reflexes. No cranial nerve deficit. Coordination normal.  Skin: Skin is warm and dry. No rash noted. No erythema. No pallor.  Psychiatric:  She is significantly reluctant with possible cognitive deficit.     Complete Blood Count (Most recent): Lab Results  Component Value Date   WBC 8.3 11/08/2015   HGB 9.9* 11/08/2015   HCT 32.6* 11/08/2015   MCV 75.8* 11/08/2015   PLT 263 11/08/2015   Chemistry (most recent): Lab Results  Component Value Date   NA 137 01/12/2016   K 4.0 01/12/2016   CL 97* 01/12/2016   CO2 29  01/12/2016   BUN 35* 01/12/2016   CREATININE 2.23* 01/12/2016   Diabetic Labs (most recent): Lab Results  Component Value Date   HGBA1C 11.7* 01/12/2016   HGBA1C 10.7* 03/05/2014   HGBA1C 11.5* 12/18/2013   Lipid Panel     Component Value Date/Time   CHOL 191 12/18/2013 1058   TRIG 186* 12/18/2013 1058   HDL 39* 12/18/2013 1058   CHOLHDL 4.9 12/18/2013 1058   VLDL 37 12/18/2013 1058   LDLCALC 115* 12/18/2013 1058   LDLDIRECT 118.4 08/13/2013 1502     Assessment & Plan:   1. Uncontrolled type 2 diabetes mellitus with other specified complication ([place])  - Her  diabetes is  complicated by coronary artery disease, chronic kidney disease, peripheral neuropathy, and peripheral arterial disease and patient remains at a high risk for more acute and chronic complications of diabetes which include CAD, CVA, CKD, retinopathy, and neuropathy. These are all discussed in detail with the patient.  Patient came with above target  glucose profile nor her meter, and her recent A1c is higher at 11.7 % up from  10.7%. Recent labs reviewed.   - I have re-counseled the patient on diet management  by adopting a carbohydrate restricted / protein rich  Diet.  - Suggestion is made for patient to avoid simple carbohydrates   from  their diet including Cakes , Desserts, Ice Cream,  Soda (  diet and regular) , Sweet Tea , Candies,  Chips, Cookies, Artificial Sweeteners,   and "Sugar-free" Products .  This will help patient to have stable blood glucose profile and potentially avoid unintended  Weight gain.  - Patient is advised to stick to a routine mealtimes to eat 3 meals  a day and avoid unnecessary snacks ( to snack only to correct hypoglycemia).  - The patient  has been  scheduled with Jearld Fenton, RDN, CDE for individualized DM education.  - I have approached patient with the following individualized plan to manage diabetes and patient agrees.  - She brought him to box of insulin products mainly duplicates of insulin types and expired formulations. -Mrs. Selvey's care is becoming concerning and she may not be able to do the intensive insulin therapy.  she lives alone. - She will be initiated on premixed insulin Novolin 70/30 20 units with breakfast and 20 units with supper for pre-meal blood glucose above 90 mg/dL. I have taken away all of the insulin production protein to avoid inadvertent insulin overdose and hypoglycemia. -She says she cannot afford the co-pays of NovoLog 70/30.   -Patient is encouraged to call clinic for blood glucose levels less than 70 or above 300 mg /dl.  -She will be taken off of Victoza.   - Patient specific target  for A1c; LDL, HDL, Triglycerides, and  Waist Circumference were discussed in detail.  2) BP/HTN: Uncontrolled, patient could not confirm if she is consistent taking her blood pressure medications. I advised her to resume her current medications including ACEI/ARB. 3) Lipids/HPL:  continue ezetimibe, continue Crestor 20 mg by mouth daily at bedtime.   4)  Weight/Diet: CDE consult in progress, exercise, and carbohydrates information provided.  5) Chronic Care/Health Maintenance:  -Patient  on ACEI/ARB and Statin medications and encouraged to continue to follow up with  Ophthalmology, Podiatrist at least yearly or according to recommendations, and advised to  stay away from smoking. I have recommended yearly flu vaccine and pneumonia vaccination at least every 5 years; moderate intensity exercise for up to 150 minutes weekly;  and  sleep for at least 7 hours a day.  - 25 minutes of time was spent on the care of this patient , 50% of which was applied for counseling on diabetes complications and their preventions.  - I advised patient to maintain close follow up with Krystal Font, MD for primary care needs.  Patient is asked to bring meter and  blood glucose logs during their next visit.   Follow up plan: -Return in about 2 weeks (around 03/08/2016) for diabetes, high blood pressure, high cholesterol, follow up with meter and logs- no labs.  Glade Lloyd, MD Phone: [phone]  Fax: [phone]   02/23/2016, 3:54 PM

## 2016-02-23 NOTE — Patient Outreach (Signed)
[facility]) Care Management  02/23/2016  REYANNA CLERMONT February 02, 1936 [id number]   Bryttney August is a 80yo who was referred to [place] for medication management.  I received a phone call from patient who reports she went to the doctor today.  She reports her insulin was changed and provider disposed of all of her old insulin.  Chart reviewed and noted that patient was prescribed Novolog 70/30 insulin.  Patient reports she went to the pharmacy to pick up the medication but she was unable to afford it because the copay was $90.  Patient has two insurance plans ([employer] and a [employer] part D plan).  Patient has had difficulty in the past with the pharmacy only using one of her two insurance plans.  I asked patient if she talked to the pharmacy to confirm that the prescription was run under both insurance plans.  Patient reports she had her caretaker go into the pharmacy for her and she forgot to tell her caretaker to ask about that.  Patient reports she did not pick up the medication due to the cost.    I made outreach call to [facility] with patient's permission and spoke to the pharmacist, Lanny Hurst.  Lanny Hurst confirmed that the prescription was only run under patient's [employer].  After the claim was corrected and ran under both of patient's insurance plans, Lanny Hurst reported that there was actually no copay for the prescription.    I called patient to update her on this information.  She thanked me for my help.  Patient reports she will try to get someone to assist her in picking up the prescription from the pharmacy tonight.    I again advised patient to always confirm with the pharmacy that her medications are filled using both of her insurance plans.    Plan:  Follow up home visit on 03/14/16 as previously scheduled.    [name], Pharm.D. Pharmacy Resident [place] [phone]

## 2016-02-23 NOTE — Patient Instructions (Signed)

## 2016-02-24 ENCOUNTER — Ambulatory Visit: Payer: Self-pay | Admitting: *Deleted

## 2016-02-29 ENCOUNTER — Other Ambulatory Visit: Payer: Self-pay | Admitting: *Deleted

## 2016-02-29 NOTE — Patient Outreach (Signed)
[facility] [facility]) Care Management   02/29/2016  ADDLEY JAMA 05-04-1936 [id number]  Krystal [name] is an 80 y.o. female  Subjective:  Patient reporting she has had a good day today, she was some short of breath after returning from errand, patient did not take her oxygen. Overall patient reports she is doing better. Patient now has some help in her home through [facility] 4 hours a day M-F. However, she is still looking at possible assisted living for near future.  Patient reports she saw Dr. Dorris Fetch, he changed insulin, she is asking about how to use, she had trouble getting insulin pen to work.  Patient happy with medication system that [facility] pharmacist has set up for her.   Objective:   BP 140/64 mmHg  Pulse 62  Resp 20  Wt 192 lb (87.091 kg)  SpO2 98% Review of Systems  Constitutional: Negative.   HENT: Negative.   Eyes: Negative.   Respiratory: Positive for shortness of breath.        Reports she went out without oxygen and got short of breath, better with rest  Cardiovascular: Negative.   Gastrointestinal: Positive for constipation.       Using miralax  Musculoskeletal: Positive for back pain.       Chronic back pain, has pain medications  Endo/Heme/Allergies: Positive for environmental allergies.    Physical Exam  Constitutional: She is oriented to person, place, and time. She appears well-developed.  Cardiovascular: Normal rate.   GI: Soft.  Neurological: She is alert and oriented to person, place, and time.  Skin: Skin is warm and dry.    Encounter Medications:   Outpatient Encounter Prescriptions as of 02/29/2016  Medication Sig Note  . aspirin 81 MG EC tablet Take 81 mg by mouth every morning.    . calcitRIOL (ROCALTROL) 0.25 MCG capsule Take 0.25 mcg by mouth daily. Reported on 01/20/2016   . clopidogrel (PLAVIX) 75 MG tablet Take 1 tablet (75 mg total) by mouth daily.   Marland Kitchen ezetimibe (ZETIA) 10 MG tablet Take 1 tablet (10 mg total) by mouth every  morning.   . famotidine (PEPCID) 20 MG tablet Take 1 tablet (20 mg total) by mouth daily.   . febuxostat (ULORIC) 40 MG tablet Take 40 mg by mouth daily.   . ferrous sulfate 325 (65 FE) MG tablet Take 325 mg by mouth daily.    . Fluticasone-Salmeterol (ADVAIR DISKUS) 250-50 MCG/DOSE AEPB Inhale 1 puff into the lungs 2 (two) times daily.   Marland Kitchen glucose blood test strip Use as instructed 4 x daily. E11.65. One Touch Verio test Strips   . HYDROcodone-acetaminophen (NORCO/VICODIN) 5-325 MG tablet Take 1 tablet by mouth every 12 (twelve) hours as needed for moderate pain.   Marland Kitchen insulin aspart protamine - aspart (NOVOLOG MIX 70/30 FLEXPEN) (70-30) 100 UNIT/ML FlexPen Inject 0.2 mLs (20 Units total) into the skin 2 (two) times daily with a meal.   . Insulin Pen Needle (B-D ULTRAFINE III SHORT PEN) 31G X 8 MM MISC 1 each by Does not apply route as directed.   . isosorbide mononitrate (IMDUR) 60 MG 24 hr tablet Take 1 tablet (60 mg total) by mouth daily.   Marland Kitchen lisinopril (PRINIVIL,ZESTRIL) 10 MG tablet TAKE (1) TABLET BY MOUTH ONCE DAILY.   . nebivolol (BYSTOLIC) 5 MG tablet Take 1 tablet (5 mg total) by mouth 2 (two) times daily.   Marland Kitchen NITROSTAT 0.4 MG SL tablet PLACE ONE (1) TABLET UNDER TONGUE EVERY 5 MINUTES UP  TO (3) DOSES AS NEEDED FOR CHEST PAIN. (Patient taking differently: PLACE ONE (1) TABLET UNDER TONGUE EVERY 5 MINUTES UP TO (3) DOSES AS NEEDED FOR CHEST  used 2 weeks ago) 02/22/2016: Has on hand if needed  . polyethylene glycol (MIRALAX / GLYCOLAX) packet Take 17 g by mouth daily as needed for mild constipation.   Marland Kitchen RANEXA 500 MG 12 hr tablet TAKE (1) TABLET TWICE DAILY.   . rosuvastatin (CRESTOR) 20 MG tablet Take 1 tablet (20 mg total) by mouth every evening.   . torsemide (DEMADEX) 20 MG tablet May take extra 20 mg daily for swelling    No facility-administered encounter medications on file as of 02/29/2016.    Functional Status:   In your present state of health, do you have any difficulty  performing the following activities: 02/01/2016 02/01/2016  Hearing? N N  Vision? Y N  Difficulty concentrating or making decisions? N N  Walking or climbing stairs? Y Y  Dressing or bathing? Y Y  Doing errands, shopping? N N  Preparing Food and eating ? Y N  Using the Toilet? N N  In the past six months, have you accidently leaked urine? Y Y  Do you have problems with loss of bowel control? N N  Managing your Medications? Y Y  Managing your Finances? N N  Housekeeping or managing your Housekeeping? [name]    Fall/Depression Screening:    Fall Risk  02/29/2016 02/23/2016 02/01/2016 01/20/2016 12/06/2015  Falls in the past year? No No Yes Yes No  Number falls in past yr: - - 1 1 -  Injury with Fall? - - Yes Yes -  Risk Factor Category  - - High Fall Risk - -  Risk for fall due to : - - Impaired balance/gait;Impaired mobility - Impaired balance/gait  Risk for fall due to (comments): - - - - uses cane  Follow up - - Falls prevention discussed Falls prevention discussed -   PHQ 2/9 Scores 02/23/2016 02/01/2016 01/20/2016 12/06/2015 10/26/2015 06/24/2015  PHQ - 2 Score 0 0 0 0 0 0    Assessment:   HF: Stable, patient would like to focus on Diabetes management  Diabetes: HGBA1C elevated, Endocrinologist changed insulin and wants her to return in 2 weeks. RNCM instructed on how to use insulin pen.  Plan:   [facility] CM Care Plan Problem One        Most Recent Value   Care Plan Problem One  Knowledge deficit related to diabetes as evidenced by patient questions around diabetes   Role Documenting the Problem One  Care Management [place] for Problem One  Active   THN Long Term Goal (31-90 days)  Patient will verbalize understanding of diabetes management over the next 60 days   THN Long Term Goal Start Date  02/29/16   Interventions for Problem One Long Term Goal  Reviewed hgb A1C number, reviewed new insulin and demonstrated how to use   THN CM Short Term Goal #1 (0-30 days)  Patient will  keep a cbg log showing her bid CBGs and will take to endocrinologist appointment in the next 21 days   THN CM Short Term Goal #1 Start Date  02/29/16   Interventions for Short Term Goal #1  reviewed CBG log, reviewed insulin instructions given by endocrinologist      Plan to make covisit with [facility] pharmacist Apolonio Schneiders  Patient to see Dr. Dorris Fetch in a couple of weeks and will take CBG  log.   Horst Ostermiller E. [name], RN, BSN, [place] [phone]

## 2016-03-07 ENCOUNTER — Other Ambulatory Visit: Payer: Self-pay | Admitting: "Endocrinology

## 2016-03-08 ENCOUNTER — Other Ambulatory Visit: Payer: Self-pay

## 2016-03-08 ENCOUNTER — Other Ambulatory Visit: Payer: Self-pay | Admitting: Cardiovascular Disease

## 2016-03-08 ENCOUNTER — Ambulatory Visit: Payer: Medicare Other | Admitting: "Endocrinology

## 2016-03-08 MED ORDER — NITROGLYCERIN 0.4 MG SL SUBL: 0.4000 mg | SUBLINGUAL_TABLET | SUBLINGUAL | Status: AC | PRN

## 2016-03-08 NOTE — Telephone Encounter (Signed)
Rx refill sent to pharmacy. 

## 2016-03-08 NOTE — Telephone Encounter (Signed)
ntg refilled  

## 2016-03-13 ENCOUNTER — Other Ambulatory Visit: Payer: Self-pay | Admitting: *Deleted

## 2016-03-13 ENCOUNTER — Encounter: Payer: Self-pay | Admitting: *Deleted

## 2016-03-13 ENCOUNTER — Other Ambulatory Visit: Payer: Self-pay | Admitting: Pharmacist

## 2016-03-13 NOTE — Patient Outreach (Signed)
[facility]) Care Management   03/13/2016  Krystal [name] 1936/04/15 [id number]  Krystal [name] is an 80 y.o. female  Co-Visit with patient, home health aide and Krystal [name], [facility] pharmacist  Subjective:  Patient states she is doing good today. She does complain of chronic back and knee pain. Patient verbalizes overall satisfaction with her new aide from local provider. She has aide M-F 11a-3p and the aide is assisting her with meals, medication reminders, housekeeping and some personal care if needed.  Patient has question about incontinence supplies, due to occasional incontinence, she will check on benefit through her [employer] supplement plan. Patient has been doing good checking her blood sugars but her blood sugars are elevated. She was instructed to call Dr. Dorris [name], but states she did not call him, she has an appointment to see him later this week and will take her log for him to review.  Patient reports she is doing somewhat better on following schedule for diet.  Patient overall reports she is feeling better, she does not "want to go back to the hospital" states it has taken several months to start getting back on her feet, states she knows she has more to do to get back on track.   Objective:   BP 130/72 mmHg  Pulse 66  Resp 20  Wt 192 lb (87.091 kg)  SpO2 98% Review of Systems  Constitutional: Negative.   HENT: Negative.   Eyes: Negative.   Respiratory: Negative.   Cardiovascular: Negative.   Gastrointestinal: Negative.   Genitourinary: Positive for frequency.  Musculoskeletal: Negative.   Skin: Negative.   Neurological: Negative.   Psychiatric/Behavioral: Negative.     Physical Exam  Constitutional: She is oriented to person, place, and time. She appears well-developed and well-nourished.  Neck: Normal range of motion.  Cardiovascular: Normal rate.   GI: Soft. Bowel sounds are normal.  Musculoskeletal: Normal range of motion.   Neurological: She is alert and oriented to person, place, and time.    Encounter Medications:   Outpatient Encounter Prescriptions as of 03/13/2016  Medication Sig Note  . aspirin 81 MG EC tablet Take 81 mg by mouth every morning.    . clopidogrel (PLAVIX) 75 MG tablet Take 1 tablet (75 mg total) by mouth daily.   Marland Kitchen ezetimibe (ZETIA) 10 MG tablet Take 1 tablet (10 mg total) by mouth every morning.   . famotidine (PEPCID) 20 MG tablet Take 1 tablet (20 mg total) by mouth daily.   . febuxostat (ULORIC) 40 MG tablet Take 40 mg by mouth daily.   . ferrous sulfate 325 (65 FE) MG tablet Take 325 mg by mouth daily.    . Fluticasone-Salmeterol (ADVAIR DISKUS) 250-50 MCG/DOSE AEPB Inhale 1 puff into the lungs 2 (two) times daily.   Marland Kitchen glucose blood (ONETOUCH VERIO) test strip E11.65 Use 4 times daily as directed   . HYDROcodone-acetaminophen (NORCO/VICODIN) 5-325 MG tablet Take 1 tablet by mouth every 12 (twelve) hours as needed for moderate pain.   Marland Kitchen insulin aspart protamine - aspart (NOVOLOG MIX 70/30 FLEXPEN) (70-30) 100 UNIT/ML FlexPen Inject 0.2 mLs (20 Units total) into the skin 2 (two) times daily with a meal.   . Insulin Pen Needle (B-D ULTRAFINE III SHORT PEN) 31G X 8 MM MISC 1 each by Does not apply route as directed.   . isosorbide mononitrate (IMDUR) 60 MG 24 hr tablet Take 1 tablet (60 mg total) by mouth daily.   Marland Kitchen lisinopril (PRINIVIL,ZESTRIL) 10 MG tablet TAKE (  1) TABLET BY MOUTH ONCE DAILY.   . nebivolol (BYSTOLIC) 5 MG tablet Take 1 tablet (5 mg total) by mouth 2 (two) times daily.   . nitroGLYCERIN (NITROSTAT) 0.4 MG SL tablet Place 1 tablet (0.4 mg total) under the tongue every 5 (five) minutes as needed for chest pain.   . polyethylene glycol (MIRALAX / GLYCOLAX) packet Take 17 g by mouth daily as needed for mild constipation.   Marland Kitchen RANEXA 500 MG 12 hr tablet TAKE (1) TABLET TWICE DAILY.   . rosuvastatin (CRESTOR) 20 MG tablet Take 1 tablet (20 mg total) by mouth every evening.   .  torsemide (DEMADEX) 20 MG tablet May take extra 20 mg daily for swelling   . calcitRIOL (ROCALTROL) 0.25 MCG capsule Take 0.25 mcg by mouth daily. Reported on 03/13/2016 03/13/2016: Refill denied by nephrology, request has been sent to patient's PCP but refill has not been approved; patient has appointment on 04/18/16 with nephrologist which was the earliest available appointment.    No facility-administered encounter medications on file as of 03/13/2016.    Functional Status:   In your present state of health, do you have any difficulty performing the following activities: 02/29/2016 02/01/2016  Hearing? N N  Vision? Y Y  Difficulty concentrating or making decisions? N N  Walking or climbing stairs? Y Y  Dressing or bathing? N Y  Doing errands, shopping? Y N  Preparing Food and eating ? N Y  Using the Toilet? N N  In the past six months, have you accidently leaked urine? Y Y  Do you have problems with loss of bowel control? N N  Managing your Medications? Y Y  Managing your Finances? N N  Housekeeping or managing your Housekeeping? Krystal [name]    Fall/Depression Screening:    Fall Risk  03/13/2016 02/29/2016 02/23/2016 02/01/2016 01/20/2016  Falls in the past year? - No No Yes Yes  Number falls in past yr: - - - 1 1  Injury with Fall? - - - Yes Yes  Risk Factor Category  - - - High Fall Risk -  Risk for fall due to : Impaired balance/gait - - Impaired balance/gait;Impaired mobility -  Risk for fall due to (comments): - - - - -  Follow up - - - Falls prevention discussed Falls prevention discussed   PHQ 2/9 Scores 02/23/2016 02/01/2016 01/20/2016 12/06/2015 10/26/2015 06/24/2015  PHQ - 2 Score 0 0 0 0 0 0    Assessment:   Diabetes: Patient overall CBGs elevated to 200-300, unable to obtain 7,14, 28 day averages from machine, RNCM and pharmacist both attempted. Gave and reviewed diabetes educational packet, snacks, low CBG and high CBG treatment plan.  Pharmacist reviewed insulin pen usage again.  HF:  patient keeping weight log, weights stable, no symptoms.  Plan:   [facility] CM Care Plan Problem One        [name] Recent Value   Care Plan Problem One  Knowledge deficit related to diabetes as evidenced by patient questions around diabetes   Role Documenting the Problem One  Care Management [place] for Problem One  Active   THN Long Term Goal (31-90 days)  Patient will verbalize understanding of diabetes management over the next 60 days   Interventions for Problem One Long Term Goal  Gave diabetes packet, reviewed HGBA1C number, reviewed increase and what it means, reviewed diet and medications, tx of high and low blood sugar   THN CM Short Term Goal #1 (  0-30 days)  Patient will keep a cbg log showing her bid CBGs and will take to endocrinologist appointment in the next 21 days   THN CM Short Term Goal #1 Start Date  02/29/16   Interventions for Short Term Goal #1  Reviewed log and instructions of Dr. Dorris [name], patient is checking blood surgars avg 200-300     Will visit again in May Patient will keep upcoming appointment with Dr. Tori Milks E. [name], RN, BSN, [place] [phone]

## 2016-03-13 NOTE — Patient Outreach (Signed)
[name]   03/13/2016  Krystal [name] 11-12-1936 [id number]  Subjective: Krystal [name] is a 80yo who was referred to [place] for medication management.  I made joint home visit with Burgess Amor.  Patient continues to fill her prescriptions at [facility] in [place] and uses their delivery service.  Patient reports adherence with her medications including new insulin regimen of Novolog 70/30 mix 20 units daily.  Patient had blood glucose log provided during her visit with Dr. Dorris Fetch and has been recording her blood glucose readings.  Patient reports blood glucose has been elevated.  She has visit with Dr. Dorris Fetch scheduled for 03/16/16.  Patient confirms she plans to take her blood glucose log and meter to her visit.    Objective:  Blood glucose = 248 mg/dL   Encounter Medications: Outpatient Encounter Prescriptions as of 03/13/2016  Medication Sig Note  . aspirin 81 MG EC tablet Take 81 mg by mouth every morning.    . clopidogrel (PLAVIX) 75 MG tablet Take 1 tablet (75 mg total) by mouth daily.   Marland Kitchen ezetimibe (ZETIA) 10 MG tablet Take 1 tablet (10 mg total) by mouth every morning.   . famotidine (PEPCID) 20 MG tablet Take 1 tablet (20 mg total) by mouth daily.   . febuxostat (ULORIC) 40 MG tablet Take 40 mg by mouth daily.   . ferrous sulfate 325 (65 FE) MG tablet Take 325 mg by mouth daily.    . Fluticasone-Salmeterol (ADVAIR DISKUS) 250-50 MCG/DOSE AEPB Inhale 1 puff into the lungs 2 (two) times daily.   Marland Kitchen glucose blood (ONETOUCH VERIO) test strip E11.65 Use 4 times daily as directed   . HYDROcodone-acetaminophen (NORCO/VICODIN) 5-325 MG tablet Take 1 tablet by mouth every 12 (twelve) hours as needed for moderate pain.   Marland Kitchen insulin aspart protamine - aspart (NOVOLOG MIX 70/30 FLEXPEN) (70-30) 100 UNIT/ML FlexPen Inject 0.2 mLs (20 Units total) into the skin 2 (two) times daily with a meal.   . Insulin Pen Needle (B-D  ULTRAFINE III SHORT PEN) 31G X 8 MM MISC 1 each by Does not apply route as directed.   . isosorbide mononitrate (IMDUR) 60 MG 24 hr tablet Take 1 tablet (60 mg total) by mouth daily.   Marland Kitchen lisinopril (PRINIVIL,ZESTRIL) 10 MG tablet TAKE (1) TABLET BY MOUTH ONCE DAILY.   . nebivolol (BYSTOLIC) 5 MG tablet Take 1 tablet (5 mg total) by mouth 2 (two) times daily.   . nitroGLYCERIN (NITROSTAT) 0.4 MG SL tablet Place 1 tablet (0.4 mg total) under the tongue every 5 (five) minutes as needed for chest pain.   . polyethylene glycol (MIRALAX / GLYCOLAX) packet Take 17 g by mouth daily as needed for mild constipation.   Marland Kitchen RANEXA 500 MG 12 hr tablet TAKE (1) TABLET TWICE DAILY.   . rosuvastatin (CRESTOR) 20 MG tablet Take 1 tablet (20 mg total) by mouth every evening.   . torsemide (DEMADEX) 20 MG tablet May take extra 20 mg daily for swelling   . calcitRIOL (ROCALTROL) 0.25 MCG capsule Take 0.25 mcg by mouth daily. Reported on 03/13/2016 03/13/2016: Refill denied by nephrology, request has been sent to patient's PCP but refill has not been approved; patient has appointment on 04/18/16 with nephrologist which was the earliest available appointment.    No facility-administered encounter medications on file as of 03/13/2016.    Functional Status: In your present state of health, do you have any difficulty performing  the following activities: 02/29/2016 02/01/2016  Hearing? N N  Vision? Y Y  Difficulty concentrating or making decisions? N N  Walking or climbing stairs? Y Y  Dressing or bathing? N Y  Doing errands, shopping? Y N  Preparing Food and eating ? N Y  Using the Toilet? N N  In the past six months, have you accidently leaked urine? Y Y  Do you have problems with loss of bowel control? N N  Managing your Medications? Y Y  Managing your Finances? N N  Housekeeping or managing your Housekeeping? [name]    Fall/Depression Screening: PHQ 2/9 Scores 02/23/2016 02/01/2016 01/20/2016 12/06/2015 10/26/2015 06/24/2015   PHQ - 2 Score 0 0 0 0 0 0    Assessment: 1.  Medication management:  Patient continues to use weekly pill boxes for her medications.  Patient had all medications today during visit except calcitriol.  Requested calcitriol refill from patient's nephrologist at last visit; however request was denied because she has not been seen in over a year.  Patient scheduled visit with nephrologist, but first available appointment was 04/18/16.  Refill request was sent to patient's PCP for calcitriol.  Heather at [facility] reports PCP has not responded to refill request.  Patient placed call to her primary care office and spoke to [place].  Patient requested refill of calcitriol until she can be seen by her nephrologist on 04/18/16.  Refill was approved by PCP office and prescription was sent to [facility].    I assisted patient in filling her pill boxes.  Patient's pill boxes were filled for the next two weeks with the following medications: aspirin, clopidogrel, ezetimibe, famotidine, febuxostat, ferrous sulfate, isosorbide mononitrate, lisinopril, nebivolol, ranolazine, rosuvastatin, and torsemide.  Also assisted patient in ordering refills from [facility] for the following medications: clopidogrel, famotidine, ferrous sulfate, isosorbide mononitrate, lisinopril, ranolazine, and torsemide.  Discussed automatic refills with Heather at [facility] reports they only do automatic refills for patients who are enrolled in adherence packaging.  Discussed use of adherence packaging with patient.  Informed her that pharmacy could package medications for her (except for her Uloric as she receives that medication from the manufacturer).  Patient plans to think about enrolling in adherence packaging at [facility]  Will discuss further at next visit.    Plan: 1.  Patient to continue to take all medications as prescribed using weekly pill boxes.  Advised patient to take calcitriol as  directed on the medication bottle once she receives it from the pharmacy.   2.  Follow up home visit scheduled for 03/26/16.    THN CM Care Plan Problem One        Most Recent Value   Care Plan Problem One  Medication management   Role Documenting the Problem One  Clinical Pharmacist   Care Plan for Problem One  Active   THN Long Term Goal (31-90 days)  Patient will develop a system for self-managing her medications in the next 90 days.    THN Long Term Goal Start Date  01/20/16 [goal renewed]   Interventions for Problem One Long Term Goal  Patient continues to use weekly pill boxes for her medications.  Patient has become more organized with her medication bottles.  Patient still requires assistance with requesting refills and filling pill box.  Assisted patient in refilled pill boxes for the next 2 weeks.  Will consider enrolling in pill packs through the pharmacy at the next visit.  [name], Pharm.D. Pharmacy Resident [place] [phone]

## 2016-03-14 ENCOUNTER — Ambulatory Visit: Payer: Self-pay | Admitting: *Deleted

## 2016-03-14 ENCOUNTER — Ambulatory Visit: Payer: Medicare Other | Admitting: Pharmacist

## 2016-03-16 ENCOUNTER — Encounter: Payer: Self-pay | Admitting: "Endocrinology

## 2016-03-16 ENCOUNTER — Ambulatory Visit (INDEPENDENT_AMBULATORY_CARE_PROVIDER_SITE_OTHER): Payer: Medicare Other | Admitting: "Endocrinology

## 2016-03-16 ENCOUNTER — Other Ambulatory Visit: Payer: Self-pay | Admitting: Licensed Clinical Social Worker

## 2016-03-16 VITALS — BP 137/70 | HR 64 | Ht 68.0 in | Wt 197.0 lb

## 2016-03-16 DIAGNOSIS — N183 Chronic kidney disease, stage 3 unspecified: Secondary | ICD-10-CM

## 2016-03-16 DIAGNOSIS — I10 Essential (primary) hypertension: Secondary | ICD-10-CM

## 2016-03-16 DIAGNOSIS — Z794 Long term (current) use of insulin: Secondary | ICD-10-CM | POA: Diagnosis not present

## 2016-03-16 DIAGNOSIS — E785 Hyperlipidemia, unspecified: Secondary | ICD-10-CM

## 2016-03-16 DIAGNOSIS — E1122 Type 2 diabetes mellitus with diabetic chronic kidney disease: Secondary | ICD-10-CM

## 2016-03-16 NOTE — Patient Outreach (Signed)
Assessment:  CSW spoke via phone with client. CSW verified client identity. CSW and client spoke of client needs. Clent reported that she sees Dr. Berdine Addison as her primary doctor. She said she plans to see Dr. Berdine Addison for a medical appointment next month.  Client said she has appointment on 03/16/16 with Dr. Dorris Fetch in [place], [place].  She said she had not experienced any recent falls. Client said she receives her prescribed medications from [facility] in [place], [place].  [place] and client spoke of her care needs in the home. Client said she has a home health aide who assists her in the home as needed on Mondays through Fridays from 11:00 AM to 3:00 PM on each of these scheduled days. She said in home aide support has been very helpful to her at present. CSW spoke with [place] about level of care needs. Client said she wanted to try to stay at her home as long as possible. Client said her Bi-Pap mask was too small for her to use and that she had not been sleeping or resting well at night. CSW called RN Burgess Amor and left phone message for Stanton Kidney that client reported that her Bi-Pap mask was too small. Client said she had received Bi-Pap mask from [place]. CSW and client spoke of client support. She said she has friends that occasionally help transport her to and from scheduled medical appointments.  CSW and client spoke of client care plan. CSW encouraged client to continue communicating with CSW in next 30 days to discuss community resources, to discuss in home care support for client, and to discuss level of care options for client.  Syn[place] said she was doing better recently with in home aide support and client wanted to continue to reside at home with support of aide, as scheduled. Client is receiving support with [facility] pharmacy program.  Client continues to receive [facility] nursing support with RN Burgess Amor.  CSW thanked client for phone call with CSW. CSW reminded client to call CSW as needed at [phone]  to discuss social work needs of client.  Plan:  Client to communicate, as needed, with CSW in next 30 days to discuss community resources, to discuss in home care support for client, and to discuss level of care options for client. CSW to collaborate with RN Burgess Amor in monitoring needs of client. CSW to call client in 4 weeks to assess needs of client at that time.   Norva Riffle.Domenico Achord MSW, LCSW Licensed Clinical Social Worker [facility] Care Management [phone]

## 2016-03-16 NOTE — Patient Instructions (Signed)

## 2016-03-16 NOTE — Progress Notes (Signed)
Subjective:    Patient ID: Krystal [name], female    DOB: 1936-10-31, PCP [name], MD   Past Medical History  Diagnosis Date  . Sleep apnea     CPAP  . Myalgia   . Palpitations   . Microcytic anemia     History of occult blood in stool  . DJD (degenerative joint disease)   . CAD (coronary artery disease) 2009    5 stents- #3 in RCA, #1 each in LAD and AVG  . Osteoarthritis   . Essential hypertension   . Hyperlipidemia   . GERD (gastroesophageal reflux disease)   . Allergic rhinitis   . Personal history of colonic polyps   . HOH (hard of hearing)   . MI (myocardial infarction) ([place]) 1998  . Glaucoma   . Diastolic dysfunction, left ventricle   . CKD (chronic kidney disease) stage 3, GFR 30-59 ml/min   . Emphysema lung (HCC)     2L N/C continuously  . Complete heart block, transient 2014  . Gout   . Type II diabetes mellitus with nephropathy ([place])   . History of uterine cancer    Past Surgical History  Procedure Laterality Date  . Ectopic pregnancy surgery    . Foot surgery      Left and right for callous  . Cervical discectomy      L5 left/hemilminectomy  . Colonoscopy  09/2006    Int hemmorhoids, COMPLICATED BY CARDIOPULMONARY COMPLICATIONS  . Abdominal hysterectomy  04/2010    Uterine cancer,TAHBSO  . Polypectomy  10/22/2011    Internal hemorrhoids/sessile polyp  . Coronary angioplasty  1/99, 1/07, 1/08, 4/09    5 cardiac stents total  . Left and right heart catheterization with coronary angiogram N/A 09/12/2012    Procedure: LEFT AND RIGHT HEART CATHETERIZATION WITH CORONARY ANGIOGRAM;  Surgeon: Sanda Klein, MD;  Location: [facility] CATH LAB;  Service: Cardiovascular;  Laterality: N/A;   Social History   Social History  . Marital Status: Widowed    Spouse Name: N/A  . Number of Children: 0  . Years of Education: 12   Occupational History  .      retired   Social History Main Topics  . Smoking status: Former Smoker -- 0.40 packs/day for 51 years     Types: Cigarettes    Start date: 04/22/1955    Quit date: 10/18/2006  . Smokeless tobacco: Never Used     Comment: quit about 4 yrs ago  . Alcohol Use: No  . Drug Use: No  . Sexual Activity: No   Other Topics Concern  . None   Social History Narrative   Consumes 2 cups of caffeine daily   Outpatient Encounter Prescriptions as of 03/16/2016  Medication Sig  . Insulin Aspart Prot & Aspart (NOVOLOG MIX 70/30 FLEXPEN [place]) Inject 30 Units into the skin 2 (two) times daily with a meal.  . aspirin 81 MG EC tablet Take 81 mg by mouth every morning.   . calcitRIOL (ROCALTROL) 0.25 MCG capsule Take 0.25 mcg by mouth daily. Reported on 03/13/2016  . clopidogrel (PLAVIX) 75 MG tablet Take 1 tablet (75 mg total) by mouth daily.  Marland Kitchen ezetimibe (ZETIA) 10 MG tablet Take 1 tablet (10 mg total) by mouth every morning.  . famotidine (PEPCID) 20 MG tablet Take 1 tablet (20 mg total) by mouth daily.  . febuxostat (ULORIC) 40 MG tablet Take 40 mg by mouth daily.  . ferrous sulfate 325 (65 FE) MG  tablet Take 325 mg by mouth daily.   . Fluticasone-Salmeterol (ADVAIR DISKUS) 250-50 MCG/DOSE AEPB Inhale 1 puff into the lungs 2 (two) times daily.  Marland Kitchen glucose blood (ONETOUCH VERIO) test strip E11.65 Use 4 times daily as directed  . HYDROcodone-acetaminophen (NORCO/VICODIN) 5-325 MG tablet Take 1 tablet by mouth every 12 (twelve) hours as needed for moderate pain.  . Insulin Pen Needle (B-D ULTRAFINE III SHORT PEN) 31G X 8 MM MISC 1 each by Does not apply route as directed.  . isosorbide mononitrate (IMDUR) 60 MG 24 hr tablet Take 1 tablet (60 mg total) by mouth daily.  Marland Kitchen lisinopril (PRINIVIL,ZESTRIL) 10 MG tablet TAKE (1) TABLET BY MOUTH ONCE DAILY.  . nebivolol (BYSTOLIC) 5 MG tablet Take 1 tablet (5 mg total) by mouth 2 (two) times daily.  . nitroGLYCERIN (NITROSTAT) 0.4 MG SL tablet Place 1 tablet (0.4 mg total) under the tongue every 5 (five) minutes as needed for chest pain.  . polyethylene glycol (MIRALAX  / GLYCOLAX) packet Take 17 g by mouth daily as needed for mild constipation.  Marland Kitchen RANEXA 500 MG 12 hr tablet TAKE (1) TABLET TWICE DAILY.  . rosuvastatin (CRESTOR) 20 MG tablet Take 1 tablet (20 mg total) by mouth every evening.  . torsemide (DEMADEX) 20 MG tablet May take extra 20 mg daily for swelling  . [DISCONTINUED] insulin aspart protamine - aspart (NOVOLOG MIX 70/30 FLEXPEN) (70-30) 100 UNIT/ML FlexPen Inject 0.2 mLs (20 Units total) into the skin 2 (two) times daily with a meal.   No facility-administered encounter medications on file as of 03/16/2016.   ALLERGIES: Allergies  Allergen Reactions  . Morphine Shortness Of Breath and Swelling  . Penicillins Shortness Of Breath and Swelling    Has patient had a PCN reaction causing immediate rash, facial/tongue/throat swelling, SOB or lightheadedness with hypotension: No Has patient had a PCN reaction causing severe rash involving mucus membranes or skin necrosis: No Has patient had a PCN reaction that required hospitalization Yes Has patient had a PCN reaction occurring within the last 10 years: No If all of the above answers are "NO", then may proceed with Cephalosporin use.   . Shellfish Allergy    VACCINATION STATUS: Immunization History  Administered Date(s) Administered  . Influenza Whole 09/23/2007  . Influenza,inj,Quad PF,36+ Mos 08/20/2015  . Influenza-Unspecified 09/19/2013  . Pneumococcal Polysaccharide-23 08/20/2015    Diabetes She presents for her follow-up diabetic visit. She has type 2 diabetes mellitus. Onset time: She was diagnosed at approximate age of 61 years. Her disease course has been worsening. There are no hypoglycemic associated symptoms. Pertinent negatives for hypoglycemia include no confusion, headaches, pallor or seizures. Associated symptoms include fatigue, polydipsia and polyuria. Pertinent negatives for diabetes include no chest pain and no polyphagia. There are no hypoglycemic complications. Symptoms  are worsening. Diabetic complications include heart disease, nephropathy, peripheral neuropathy and PVD. Risk factors for coronary artery disease include dyslipidemia, diabetes mellitus, hypertension, obesity, sedentary lifestyle and tobacco exposure. Current diabetic treatment includes insulin injections (She was asked to bring all of her insulin supplies at home. She managed to bring to bags of various kinds of insulin mainly duplicates of long-acting, premixed, and rapid acting insulin types. At least half of the insulin pens she brought in are expired .). She is following a generally unhealthy diet. She has had a previous visit with a dietitian. She never participates in exercise. Her home blood glucose trend is fluctuating dramatically (He did brought her meter and log showing above target  BG readings , monitoring 2-3 times a day, E AG between 100-170.). Her breakfast blood glucose range is generally >200 mg/dl. Her lunch blood glucose range is generally >200 mg/dl. Her highest blood glucose is >200 mg/dl. An ACE inhibitor/angiotensin II receptor blocker is being taken.  Hyperlipidemia This is a chronic problem. The current episode started more than 1 year ago. Exacerbating diseases include diabetes and obesity. Pertinent negatives include no chest pain, myalgias or shortness of breath. Current antihyperlipidemic treatment includes bile acid squestrants. Risk factors for coronary artery disease include diabetes mellitus, dyslipidemia, hypertension and obesity.  Hypertension This is a chronic problem. The current episode started more than 1 year ago. Pertinent negatives include no chest pain, headaches, palpitations or shortness of breath. Risk factors for coronary artery disease include diabetes mellitus, dyslipidemia, obesity, sedentary lifestyle and smoking/tobacco exposure. Past treatments include ACE inhibitors. Hypertensive end-organ damage includes kidney disease, CAD/MI and PVD.     Review of  Systems  Constitutional: Positive for fatigue. Negative for unexpected weight change.  HENT: Negative for trouble swallowing and voice change.   Eyes: Negative for visual disturbance.  Respiratory: Negative for cough, shortness of breath and wheezing.   Cardiovascular: Negative for chest pain, palpitations and leg swelling.  Gastrointestinal: Negative for nausea, vomiting and diarrhea.  Endocrine: Positive for polydipsia and polyuria. Negative for cold intolerance, heat intolerance and polyphagia.  Musculoskeletal: Negative for myalgias and arthralgias.  Skin: Negative for color change, pallor, rash and wound.  Neurological: Negative for seizures and headaches.  Psychiatric/Behavioral: Negative for suicidal ideas and confusion.    Objective:    BP 137/70 mmHg  Pulse 64  Ht 5\' 8"  (1.727 m)  Wt 197 lb (89.359 kg)  BMI 29.96 kg/m2  SpO2 94%  Wt Readings from Last 3 Encounters:  03/16/16 197 lb (89.359 kg)  03/13/16 192 lb (87.091 kg)  02/29/16 192 lb (87.091 kg)    Physical Exam  Constitutional: She is oriented to person, place, and time. She appears well-developed.  HENT:  Head: Normocephalic and atraumatic.  Eyes: EOM are normal.  Neck: Normal range of motion. Neck supple. No tracheal deviation present. No thyromegaly present.  Cardiovascular: Normal rate and regular rhythm.   Pulmonary/Chest: Effort normal and breath sounds normal.  Abdominal: Soft. Bowel sounds are normal. There is no tenderness. There is no guarding.  Musculoskeletal: Normal range of motion. She exhibits no edema.  Neurological: She is alert and oriented to person, place, and time. She has normal reflexes. No cranial nerve deficit. Coordination normal.  Skin: Skin is warm and dry. No rash noted. No erythema. No pallor.  Psychiatric:  She is significantly reluctant with possible cognitive deficit.     Complete Blood Count (Most recent): Lab Results  Component Value Date   WBC 8.3 11/08/2015   HGB 9.9*  11/08/2015   HCT 32.6* 11/08/2015   MCV 75.8* 11/08/2015   PLT 263 11/08/2015   Chemistry (most recent): Lab Results  Component Value Date   NA 137 01/12/2016   K 4.0 01/12/2016   CL 97* 01/12/2016   CO2 29 01/12/2016   BUN 35* 01/12/2016   CREATININE 2.23* 01/12/2016   Diabetic Labs (most recent): Lab Results  Component Value Date   HGBA1C 11.7* 01/12/2016   HGBA1C 10.7* 03/05/2014   HGBA1C 11.5* 12/18/2013   Lipid Panel     Component Value Date/Time   CHOL 191 12/18/2013 1058   TRIG 186* 12/18/2013 1058   HDL 39* 12/18/2013 1058   CHOLHDL 4.9 12/18/2013  1058   VLDL 37 12/18/2013 1058   LDLCALC 115* 12/18/2013 1058   LDLDIRECT 118.4 08/13/2013 1502     Assessment & Plan:   1. Uncontrolled type 2 diabetes mellitus with other specified complication ([place])  - Her  diabetes is  complicated by coronary artery disease, chronic kidney disease, peripheral neuropathy, and peripheral arterial disease and patient remains at a high risk for more acute and chronic complications of diabetes which include CAD, CVA, CKD, retinopathy, and neuropathy. These are all discussed in detail with the patient.  Patient came with above target  glucose profile nor her meter, and her recent A1c is higher at 11.7 % up from  10.7%. Recent labs reviewed.   - I have re-counseled the patient on diet management  by adopting a carbohydrate restricted / protein rich  Diet.  - Suggestion is made for patient to avoid simple carbohydrates   from their diet including Cakes , Desserts, Ice Cream,  Soda (  diet and regular) , Sweet Tea , Candies,  Chips, Cookies, Artificial Sweeteners,   and "Sugar-free" Products .  This will help patient to have stable blood glucose profile and potentially avoid unintended  Weight gain.  - Patient is advised to stick to a routine mealtimes to eat 3 meals  a day and avoid unnecessary snacks ( to snack only to correct hypoglycemia).  - The patient  has been  scheduled with  Jearld Fenton, RDN, CDE for individualized DM education.  - I have approached patient with the following individualized plan to manage diabetes and patient agrees.   -Krystal [name]'s care is becoming concerning and she may not be able to do the intensive insulin therapy.  she lives alone. - She will be continued on premixed insulin Novolog 70/30, increase to 30 units with breakfast and 30 units with supper for pre-meal blood glucose above 90 mg/dL. I have taken away all of the insulin production protein to avoid inadvertent insulin overdose and hypoglycemia.  -Patient is encouraged to call clinic for blood glucose levels less than 70 or above 300 mg /dl.  -She will be taken off of Victoza.   - Patient specific target  for A1c; LDL, HDL, Triglycerides, and  Waist Circumference were discussed in detail.  2) BP/HTN: Uncontrolled, patient could not confirm if she is consistent taking her blood pressure medications. I advised her to resume her current medications including ACEI/ARB. 3) Lipids/HPL:  continue ezetimibe, continue Crestor 20 mg by mouth daily at bedtime.   4)  Weight/Diet: CDE consult in progress, exercise, and carbohydrates information provided.  5) Chronic Care/Health Maintenance:  -Patient  on ACEI/ARB and Statin medications and encouraged to continue to follow up with Ophthalmology, Podiatrist at least yearly or according to recommendations, and advised to  stay away from smoking. I have recommended yearly flu vaccine and pneumonia vaccination at least every 5 years; moderate intensity exercise for up to 150 minutes weekly; and  sleep for at least 7 hours a day.  - 25 minutes of time was spent on the care of this patient , 50% of which was applied for counseling on diabetes complications and their preventions.  - I advised patient to maintain close follow up with [name], MD for primary care needs.  Patient is asked to bring meter and  blood glucose logs during their  next visit.   Follow up plan: -Return in about 4 weeks (around 04/13/2016) for diabetes, high blood pressure, high cholesterol, follow up with meter and  logs- no labs.  Krystal Lloyd, MD Phone: [phone]  Fax: [phone]   03/16/2016, 1:07 PM

## 2016-03-26 ENCOUNTER — Other Ambulatory Visit: Payer: Self-pay

## 2016-03-26 ENCOUNTER — Encounter: Payer: Self-pay | Admitting: Pharmacist

## 2016-03-26 ENCOUNTER — Other Ambulatory Visit: Payer: Self-pay | Admitting: Pharmacist

## 2016-03-26 MED ORDER — INSULIN ASPART PROT & ASPART (70-30 MIX) 100 UNIT/ML PEN: 30.0000 [IU] | PEN_INJECTOR | Freq: Two times a day (BID) | SUBCUTANEOUS | Status: DC

## 2016-03-26 NOTE — Patient Outreach (Signed)
[facility]) Care Management  [place]   03/26/2016  Krystal [name] 1936/06/09 [id number]  Subjective: Krystal [name] is a 80yo who was referred to [place] for medication management.  I made follow up home visit today.  Patient reports she is doing good today.    Patient reports adherence with her medications including insulin regimen of Novolog 70/30 mix 30 units BID.  Patient had blood glucose log provided during her visit with Dr. Dorris Fetch and has been recoding her blood glucose readings (listed below).  She has follow up visit with Dr. Dorris Fetch scheduled for 04/13/16.  Patient also has PCP appointment on 04/10/16 and nephrology visit on 04/18/16.    Objective:  Blood glucose readings: Date Breakfast (before) Lunch (before) Supper (before) Bed 4/28 338   353   328 4/30 294         308 5/1 175   394   341    5/2 315   300   321 5/3 336   242   203 5/4 149   124   327 5/5 213   227   498 5/6 208   202   293 5/7 252   308   405 5/8 335  Encounter Medications: Outpatient Encounter Prescriptions as of 03/26/2016  Medication Sig Note  . aspirin 81 MG EC tablet Take 81 mg by mouth every morning.    . clopidogrel (PLAVIX) 75 MG tablet Take 1 tablet (75 mg total) by mouth daily.   Marland Kitchen ezetimibe (ZETIA) 10 MG tablet Take 1 tablet (10 mg total) by mouth every morning.   . famotidine (PEPCID) 20 MG tablet Take 1 tablet (20 mg total) by mouth daily.   . febuxostat (ULORIC) 40 MG tablet Take 40 mg by mouth daily.   . ferrous sulfate 325 (65 FE) MG tablet Take 325 mg by mouth daily.    . Fluticasone-Salmeterol (ADVAIR DISKUS) 250-50 MCG/DOSE AEPB Inhale 1 puff into the lungs 2 (two) times daily.   Marland Kitchen glucose blood (ONETOUCH VERIO) test strip E11.65 Use 4 times daily as directed   . HYDROcodone-acetaminophen (NORCO/VICODIN) 5-325 MG tablet Take 1 tablet by mouth every 12 (twelve) hours as needed for moderate pain.   . Insulin Aspart Prot & Aspart (NOVOLOG MIX 70/30  FLEXPEN [place]) Inject 30 Units into the skin 2 (two) times daily with a meal.   . insulin aspart protamine - aspart (NOVOLOG MIX 70/30 FLEXPEN) (70-30) 100 UNIT/ML FlexPen Inject 0.3 mLs (30 Units total) into the skin 2 (two) times daily.   . Insulin Pen Needle (B-D ULTRAFINE III SHORT PEN) 31G X 8 MM MISC 1 each by Does not apply route as directed.   . isosorbide mononitrate (IMDUR) 60 MG 24 hr tablet Take 1 tablet (60 mg total) by mouth daily.   Marland Kitchen lisinopril (PRINIVIL,ZESTRIL) 10 MG tablet TAKE (1) TABLET BY MOUTH ONCE DAILY.   . nebivolol (BYSTOLIC) 5 MG tablet Take 1 tablet (5 mg total) by mouth 2 (two) times daily.   . nitroGLYCERIN (NITROSTAT) 0.4 MG SL tablet Place 1 tablet (0.4 mg total) under the tongue every 5 (five) minutes as needed for chest pain.   . polyethylene glycol (MIRALAX / GLYCOLAX) packet Take 17 g by mouth daily as needed for mild constipation.   Marland Kitchen RANEXA 500 MG 12 hr tablet TAKE (1) TABLET TWICE DAILY.   . rosuvastatin (CRESTOR) 20 MG tablet Take 1 tablet (20 mg total) by mouth every evening.   Marland Kitchen  torsemide (DEMADEX) 20 MG tablet May take extra 20 mg daily for swelling   . calcitRIOL (ROCALTROL) 0.25 MCG capsule Take 0.25 mcg by mouth daily. Reported on 03/26/2016 03/26/2016: Per [name], refill denied by nephrology and PCP office. Patient has appointment on 04/18/16 with nephrologist which was the earliest available appointment.     No facility-administered encounter medications on file as of 03/26/2016.   Functional Status: In your present state of health, do you have any difficulty performing the following activities: 02/29/2016 02/01/2016  Hearing? N N  Vision? Y Y  Difficulty concentrating or making decisions? N N  Walking or climbing stairs? Y Y  Dressing or bathing? N Y  Doing errands, shopping? Y N  Preparing Food and eating ? N Y  Using the Toilet? N N  In the past six months, have you accidently leaked urine? Y Y  Do you have problems with loss of bowel  control? N N  Managing your Medications? Y Y  Managing your Finances? N N  Housekeeping or managing your Housekeeping? [name]   Fall/Depression Screening: PHQ 2/9 Scores 03/16/2016 02/23/2016 02/01/2016 01/20/2016 12/06/2015 10/26/2015 06/24/2015  PHQ - 2 Score 0 0 0 0 0 0 0    Assessment: 1.  Medication management:  Patient continues to use weekly pill boxes for her medications.  Adherence confirmed by patient's pill box.  Patient has not missed any morning doses of her medications, but has missed 4 evening doses of her medications.  Discussed methods to increase adherence with evening medications such as placing pill box beside her bed.  Patient voices understanding.    Patient had at least a 4 week supply of all medications today during visit except calcitriol.  Patient requested refill from her PCP office for calcitriol at last visit.  Spoke to [employer] at [facility] who reports they never received calcitriol prescription from PCP office, and refill request has been denied by patient's nephrologist since she has not been seen in over a year.  Patient has appointment with nephrologist on 04/18/16 (first available appointment) and plans to discuss calcitriol with nephrologist at that time.    I assisted patient in filling her pill boxes.  Patent's pill boxes were filled for the next four weeks with the following medications: aspirin, clopidogrel, ezetimibe, famotidine, ferrous sulfate, isosorbide mononitrate, lisinopril, nebivolol, ranolazine, rosuvastatin, and torsemide.  Educated patient on which medications need refilling prior to next pill box fill (aspirin, clopidogrel, ezetimibe, famotidine, isosorbide mononitrate, lisinopril, ranolazine, rosuvastatin, torsemide).    Discussed adherence packaging with patient.  She remains interested, but wants to learn more about it.  Spoke to [employer] at [facility] about adherence packaging.  She said she will have someone call [name] to  tell her more about the service.    2.  Diabetes:  [place] attempted to deliver Novolog 70/30 mix insulin vial.  Patient prefers to use insulin pens.  Phone call to patient's endocrinology office to request new prescription for Novolog 70/30 mix pen to be sent to patient's pharmacy with new dosing instructions (30 units BID).  [name] will plan to deliver insulin to patient once they receive prescription.  Advised patient to call me if she does not receive the insulin by tomorrow.    Also discussed blood glucose readings with Maudie Mercury at Dr. Liliane Channel office.  Patient's blood glucose readings have ranged from 124 mg/dL to 498 mg/dL in the past 10 days.  She reports Dr. Dorris Fetch  prefers to see the last three days of patient's readings in order to make a decision.  Will route my note to Dr. Dorris Fetch including patient's blood glucose readings.    Plan: 1.  Patient to continue to take all medication as prescribed using weekly pill boxes and to take insulin regimen and Symbicort as prescribed.   2.  Patient should receive phone call from [place] regarding adherence packaging.  Will discuss adherence packaging with patient further during phone call on 04/17/16.   3.  Will route this note to patient's endocrinologist with patient's most recent blood glucose readings.   4.  Follow up home visit scheduled for 04/23/16.     THN CM Care Plan Problem One        Most Recent Value   Care Plan Problem One  Medication management   Role Documenting the Problem One  Clinical Pharmacist   Care Plan for Problem One  Active   THN Long Term Goal (31-90 days)  Patient will develop a system for self-managing her medications in the next 90 days.    THN Long Term Goal Start Date  01/20/16 [goal renewed]   Interventions for Problem One Long Term Goal  Patient continues to use weekly pill boxes for her medications.  Patient continues to require assisatnce with filling pill box.  Assisted patient  in refilling pill boxes for the next 4 weeks.  Discussed enrolling in pill packs through the pharmacy.  Spoke to [employer] at [facility] regarding patient's interest in pill packs.  Nira Conn will have someone contact patient to teach her more about pill packs.       [name], Pharm.D. Pharmacy Resident [place] [phone]

## 2016-03-30 ENCOUNTER — Encounter: Payer: Self-pay | Admitting: *Deleted

## 2016-03-30 ENCOUNTER — Other Ambulatory Visit: Payer: Self-pay | Admitting: *Deleted

## 2016-03-30 NOTE — Patient Outreach (Signed)
[facility]) Care Management   03/30/2016  Krystal [name] Dec 21, 1935 [id number]  Krystal [name] is an 80 y.o. female  Subjective:  Patient reporting she is doing well today. She is planning to move to a new apartment. Patient had a question about portable oxygen, noted that portable tank full, and if patient had further questions, she should call oxygen provider as they can answer questions about their equipment.  Patient happy to get further diabetes educational material from [facility], she is slowly working on getting diet under control and getting CBG's under better control She reports morning blood sugars much better but evening CBG's are more elevated.  Upcoming appts: 5/23-Dr. Berdine Addison 5/26-Dr. Nida 5/31-Dr. Eliseo Gum 6/6-Dr. Ziglar  Objective:   BP 138/80 mmHg  Pulse 58  Resp 20  Wt 193 lb (87.544 kg)  SpO2 94% Review of Systems  Constitutional: Negative.   HENT: Negative.   Eyes: Negative.   Respiratory: Negative.   Cardiovascular: Negative.   Gastrointestinal: Negative.   Musculoskeletal: Negative.   Endo/Heme/Allergies: Negative.     Physical Exam  Constitutional: She appears well-developed.  HENT:  Head: Normocephalic.  Cardiovascular: Normal rate.   GI: Soft.  Musculoskeletal: Normal range of motion.  Neurological: She is alert.  Skin: Skin is warm and dry.    Encounter Medications:   Outpatient Encounter Prescriptions as of 03/30/2016  Medication Sig Note  . aspirin 81 MG EC tablet Take 81 mg by mouth every morning.    . calcitRIOL (ROCALTROL) 0.25 MCG capsule Take 0.25 mcg by mouth daily. Reported on 03/26/2016 03/26/2016: [name], refill denied by nephrology and PCP office. Patient has appointment on 04/18/16 with nephrologist which was the earliest available appointment.    . clopidogrel (PLAVIX) 75 MG tablet Take 1 tablet (75 mg total) by mouth daily.   Marland Kitchen ezetimibe (ZETIA) 10 MG tablet Take 1 tablet (10 mg total) by  mouth every morning.   . famotidine (PEPCID) 20 MG tablet Take 1 tablet (20 mg total) by mouth daily.   . febuxostat (ULORIC) 40 MG tablet Take 40 mg by mouth daily.   . ferrous sulfate 325 (65 FE) MG tablet Take 325 mg by mouth daily.    . Fluticasone-Salmeterol (ADVAIR DISKUS) 250-50 MCG/DOSE AEPB Inhale 1 puff into the lungs 2 (two) times daily.   Marland Kitchen glucose blood (ONETOUCH VERIO) test strip E11.65 Use 4 times daily as directed   . HYDROcodone-acetaminophen (NORCO/VICODIN) 5-325 MG tablet Take 1 tablet by mouth every 12 (twelve) hours as needed for moderate pain.   . Insulin Aspart Prot & Aspart (NOVOLOG MIX 70/30 FLEXPEN [place]) Inject 30 Units into the skin 2 (two) times daily with a meal.   . insulin aspart protamine - aspart (NOVOLOG MIX 70/30 FLEXPEN) (70-30) 100 UNIT/ML FlexPen Inject 0.3 mLs (30 Units total) into the skin 2 (two) times daily.   . Insulin Pen Needle (B-D ULTRAFINE III SHORT PEN) 31G X 8 MM MISC 1 each by Does not apply route as directed.   . isosorbide mononitrate (IMDUR) 60 MG 24 hr tablet Take 1 tablet (60 mg total) by mouth daily.   Marland Kitchen lisinopril (PRINIVIL,ZESTRIL) 10 MG tablet TAKE (1) TABLET BY MOUTH ONCE DAILY.   . nebivolol (BYSTOLIC) 5 MG tablet Take 1 tablet (5 mg total) by mouth 2 (two) times daily.   . nitroGLYCERIN (NITROSTAT) 0.4 MG SL tablet Place 1 tablet (0.4 mg total) under the tongue every 5 (five) minutes as needed for chest pain.   Marland Kitchen  polyethylene glycol (MIRALAX / GLYCOLAX) packet Take 17 g by mouth daily as needed for mild constipation.   Marland Kitchen RANEXA 500 MG 12 hr tablet TAKE (1) TABLET TWICE DAILY.   . rosuvastatin (CRESTOR) 20 MG tablet Take 1 tablet (20 mg total) by mouth every evening.   . torsemide (DEMADEX) 20 MG tablet May take extra 20 mg daily for swelling    No facility-administered encounter medications on file as of 03/30/2016.    Functional Status:   In your present state of health, do you have any difficulty performing the following activities:  02/29/2016 02/01/2016  Hearing? N N  Vision? Y Y  Difficulty concentrating or making decisions? N N  Walking or climbing stairs? Y Y  Dressing or bathing? N Y  Doing errands, shopping? Y N  Preparing Food and eating ? N Y  Using the Toilet? N N  In the past six months, have you accidently leaked urine? Y Y  Do you have problems with loss of bowel control? N N  Managing your Medications? Y Y  Managing your Finances? N N  Housekeeping or managing your Housekeeping? [name]    Fall/Depression Screening:    PHQ 2/9 Scores 03/16/2016 02/23/2016 02/01/2016 01/20/2016 12/06/2015 10/26/2015 06/24/2015  PHQ - 2 Score 0 0 0 0 0 0 0   Fall Risk  03/16/2016 03/13/2016 02/29/2016 02/23/2016 02/01/2016  Falls in the past year? No - No No Yes  Number falls in past yr: - - - - 1  Injury with Fall? - - - - Yes  Risk Factor Category  - - - - High Fall Risk  Risk for fall due to : Impaired balance/gait Impaired balance/gait - - Impaired balance/gait;Impaired mobility  Risk for fall due to (comments): - - - - -  Follow up - - - - Falls prevention discussed    Assessment:   Diabetes: Seeing Dr.Nida monthly, he is working on insulin dosage to get CBG's down HF: Weights stable, patient asymptomatic. Instructed patient to follow up with DME company for general oxygen equipment questions  Plan:  [facility] CM Care Plan Problem One        Most Recent Value   Care Plan Problem One  Knowledge deficit related to diabetes as evidenced by patient questions around diabetes   Role Documenting the Problem One  Care Management [place] for Problem One  Active   THN Long Term Goal (31-90 days)  Patient will verbalize understanding of diabetes management over the next 60 days   THN Long Term Goal Start Date  -- [goal renewed]   Interventions for Problem One Long Term Goal  Gave CHO counting sheet, reviewed HGBA1C number, reviewed increase and what it means, reviewed diet and medications, tx of high and low blood sugar   THN  CM Short Term Goal #1 (0-30 days)  Patient will keep a cbg log showing her bid CBGs and will take to endocrinologist appointment in the next 21 days   THN CM Short Term Goal #1 Start Date  03/30/16 [goal continued]   Interventions for Short Term Goal #1  Reviewed CBG log, 100-300 range. Instructed patient to continue to log CBG and take to appt. with Dr. Dorris Fetch. Reviewed CHO educational tear off sheet     Plan to visit again in June Patient to keep upcoming MD appointments, verbalizes understanding of when to call MD and when to contact RNCM and when to call DME company.  [name]. [name], RN,  BSN, [place] [phone]

## 2016-04-10 ENCOUNTER — Other Ambulatory Visit (HOSPITAL_COMMUNITY): Payer: Self-pay | Admitting: Family Medicine

## 2016-04-10 DIAGNOSIS — Z6832 Body mass index (BMI) 32.0-32.9, adult: Secondary | ICD-10-CM | POA: Diagnosis not present

## 2016-04-10 DIAGNOSIS — N644 Mastodynia: Secondary | ICD-10-CM | POA: Diagnosis not present

## 2016-04-10 DIAGNOSIS — E1122 Type 2 diabetes mellitus with diabetic chronic kidney disease: Secondary | ICD-10-CM | POA: Diagnosis not present

## 2016-04-12 DIAGNOSIS — D509 Iron deficiency anemia, unspecified: Secondary | ICD-10-CM | POA: Diagnosis not present

## 2016-04-12 DIAGNOSIS — R809 Proteinuria, unspecified: Secondary | ICD-10-CM | POA: Diagnosis not present

## 2016-04-12 DIAGNOSIS — Z79899 Other long term (current) drug therapy: Secondary | ICD-10-CM | POA: Diagnosis not present

## 2016-04-12 DIAGNOSIS — I10 Essential (primary) hypertension: Secondary | ICD-10-CM | POA: Diagnosis not present

## 2016-04-12 DIAGNOSIS — E559 Vitamin D deficiency, unspecified: Secondary | ICD-10-CM | POA: Diagnosis not present

## 2016-04-12 DIAGNOSIS — N183 Chronic kidney disease, stage 3 (moderate): Secondary | ICD-10-CM | POA: Diagnosis not present

## 2016-04-13 ENCOUNTER — Ambulatory Visit (INDEPENDENT_AMBULATORY_CARE_PROVIDER_SITE_OTHER): Payer: Medicare Other | Admitting: "Endocrinology

## 2016-04-13 ENCOUNTER — Encounter: Payer: Self-pay | Admitting: Licensed Clinical Social Worker

## 2016-04-13 ENCOUNTER — Encounter: Payer: Self-pay | Admitting: "Endocrinology

## 2016-04-13 ENCOUNTER — Other Ambulatory Visit: Payer: Self-pay | Admitting: Licensed Clinical Social Worker

## 2016-04-13 VITALS — BP 130/64 | HR 68 | Resp 18 | Ht 68.0 in | Wt 198.0 lb

## 2016-04-13 DIAGNOSIS — I10 Essential (primary) hypertension: Secondary | ICD-10-CM

## 2016-04-13 DIAGNOSIS — E1122 Type 2 diabetes mellitus with diabetic chronic kidney disease: Secondary | ICD-10-CM | POA: Diagnosis not present

## 2016-04-13 DIAGNOSIS — N183 Chronic kidney disease, stage 3 unspecified: Secondary | ICD-10-CM

## 2016-04-13 DIAGNOSIS — Z794 Long term (current) use of insulin: Secondary | ICD-10-CM | POA: Diagnosis not present

## 2016-04-13 DIAGNOSIS — E785 Hyperlipidemia, unspecified: Secondary | ICD-10-CM | POA: Diagnosis not present

## 2016-04-13 NOTE — Progress Notes (Signed)
Subjective:    Patient ID: Krystal [name], female    DOB: May 13, 1936, PCP [name], MD   Past Medical History  Diagnosis Date  . Sleep apnea     CPAP  . Myalgia   . Palpitations   . Microcytic anemia     History of occult blood in stool  . DJD (degenerative joint disease)   . CAD (coronary artery disease) 2009    5 stents- #3 in RCA, #1 each in LAD and AVG  . Osteoarthritis   . Essential hypertension   . Hyperlipidemia   . GERD (gastroesophageal reflux disease)   . Allergic rhinitis   . Personal history of colonic polyps   . HOH (hard of hearing)   . MI (myocardial infarction) ([place]) 1998  . Glaucoma   . Diastolic dysfunction, left ventricle   . CKD (chronic kidney disease) stage 3, GFR 30-59 ml/min   . Emphysema lung (HCC)     2L N/C continuously  . Complete heart block, transient 2014  . Gout   . Type II diabetes mellitus with nephropathy ([place])   . History of uterine cancer    Past Surgical History  Procedure Laterality Date  . Ectopic pregnancy surgery    . Foot surgery      Left and right for callous  . Cervical discectomy      L5 left/hemilminectomy  . Colonoscopy  09/2006    Int hemmorhoids, COMPLICATED BY CARDIOPULMONARY COMPLICATIONS  . Abdominal hysterectomy  04/2010    Uterine cancer,TAHBSO  . Polypectomy  10/22/2011    Internal hemorrhoids/sessile polyp  . Coronary angioplasty  1/99, 1/07, 1/08, 4/09    5 cardiac stents total  . Left and right heart catheterization with coronary angiogram N/A 09/12/2012    Procedure: LEFT AND RIGHT HEART CATHETERIZATION WITH CORONARY ANGIOGRAM;  Surgeon: Sanda Klein, MD;  Location: [facility] CATH LAB;  Service: Cardiovascular;  Laterality: N/A;   Social History   Social History  . Marital Status: Widowed    Spouse Name: N/A  . Number of Children: 0  . Years of Education: 12   Occupational History  .      retired   Social History Main Topics  . Smoking status: Former Smoker -- 0.40 packs/day for 51 years     Types: Cigarettes    Start date: 04/22/1955    Quit date: 10/18/2006  . Smokeless tobacco: Never Used     Comment: quit about 4 yrs ago  . Alcohol Use: No  . Drug Use: No  . Sexual Activity: No   Other Topics Concern  . None   Social History Narrative   Consumes 2 cups of caffeine daily   Outpatient Encounter Prescriptions as of 04/13/2016  Medication Sig  . aspirin 81 MG EC tablet Take 81 mg by mouth every morning.   . calcitRIOL (ROCALTROL) 0.25 MCG capsule Take 0.25 mcg by mouth daily. Reported on 03/30/2016  . clopidogrel (PLAVIX) 75 MG tablet Take 1 tablet (75 mg total) by mouth daily.  Marland Kitchen ezetimibe (ZETIA) 10 MG tablet Take 1 tablet (10 mg total) by mouth every morning.  . famotidine (PEPCID) 20 MG tablet Take 1 tablet (20 mg total) by mouth daily.  . febuxostat (ULORIC) 40 MG tablet Take 40 mg by mouth daily.  . ferrous sulfate 325 (65 FE) MG tablet Take 325 mg by mouth daily.   . Fluticasone-Salmeterol (ADVAIR DISKUS) 250-50 MCG/DOSE AEPB Inhale 1 puff into the lungs 2 (two) times  daily.  . glucose blood (ONETOUCH VERIO) test strip E11.65 Use 4 times daily as directed  . HYDROcodone-acetaminophen (NORCO/VICODIN) 5-325 MG tablet Take 1 tablet by mouth every 12 (twelve) hours as needed for moderate pain.  Marland Kitchen insulin aspart protamine - aspart (NOVOLOG MIX 70/30 FLEXPEN) (70-30) 100 UNIT/ML FlexPen Inject 0.3 mLs (30 Units total) into the skin 2 (two) times daily.  . Insulin Pen Needle (B-D ULTRAFINE III SHORT PEN) 31G X 8 MM MISC 1 each by Does not apply route as directed.  . isosorbide mononitrate (IMDUR) 60 MG 24 hr tablet Take 1 tablet (60 mg total) by mouth daily.  Marland Kitchen lisinopril (PRINIVIL,ZESTRIL) 10 MG tablet TAKE (1) TABLET BY MOUTH ONCE DAILY.  . nebivolol (BYSTOLIC) 5 MG tablet Take 1 tablet (5 mg total) by mouth 2 (two) times daily.  . nitroGLYCERIN (NITROSTAT) 0.4 MG SL tablet Place 1 tablet (0.4 mg total) under the tongue every 5 (five) minutes as needed for chest pain.   . polyethylene glycol (MIRALAX / GLYCOLAX) packet Take 17 g by mouth daily as needed for mild constipation.  Marland Kitchen RANEXA 500 MG 12 hr tablet TAKE (1) TABLET TWICE DAILY.  . rosuvastatin (CRESTOR) 20 MG tablet Take 1 tablet (20 mg total) by mouth every evening.  . torsemide (DEMADEX) 20 MG tablet May take extra 20 mg daily for swelling  . [DISCONTINUED] Insulin Aspart Prot & Aspart (NOVOLOG MIX 70/30 FLEXPEN [place]) Inject 30 Units into the skin 2 (two) times daily with a meal.   No facility-administered encounter medications on file as of 04/13/2016.   ALLERGIES: Allergies  Allergen Reactions  . Morphine Shortness Of Breath and Swelling  . Penicillins Shortness Of Breath and Swelling    Has patient had a PCN reaction causing immediate rash, facial/tongue/throat swelling, SOB or lightheadedness with hypotension: No Has patient had a PCN reaction causing severe rash involving mucus membranes or skin necrosis: No Has patient had a PCN reaction that required hospitalization Yes Has patient had a PCN reaction occurring within the last 10 years: No If all of the above answers are "NO", then may proceed with Cephalosporin use.   . Shellfish Allergy    VACCINATION STATUS: Immunization History  Administered Date(s) Administered  . Influenza Whole 09/23/2007  . Influenza,inj,Quad PF,36+ Mos 08/20/2015  . Influenza-Unspecified 09/19/2013  . Pneumococcal Polysaccharide-23 08/20/2015    Diabetes She presents for her follow-up diabetic visit. She has type 2 diabetes mellitus. Onset time: She was diagnosed at approximate age of 84 years. Her disease course has been improving. There are no hypoglycemic associated symptoms. Pertinent negatives for hypoglycemia include no confusion, headaches, pallor or seizures. Associated symptoms include fatigue, polydipsia and polyuria. Pertinent negatives for diabetes include no chest pain and no polyphagia. There are no hypoglycemic complications. Symptoms are  improving. Diabetic complications include heart disease, nephropathy, peripheral neuropathy and PVD. Risk factors for coronary artery disease include dyslipidemia, diabetes mellitus, hypertension, obesity, sedentary lifestyle and tobacco exposure. Current diabetic treatment includes insulin injections (She was asked to bring all of her insulin supplies at home. She managed to bring to bags of various kinds of insulin mainly duplicates of long-acting, premixed, and rapid acting insulin types. At least half of the insulin pens she brought in are expired .). She is following a generally unhealthy diet. She has had a previous visit with a dietitian. She never participates in exercise. Her home blood glucose trend is fluctuating dramatically (He did brought her meter and log showing above target BG readings ,  monitoring 2-3 times a day, E AG between 100-170.). Her breakfast blood glucose range is generally 180-200 mg/dl. Her lunch blood glucose range is generally 180-200 mg/dl. Her overall blood glucose range is 180-200 mg/dl. An ACE inhibitor/angiotensin II receptor blocker is being taken.  Hyperlipidemia This is a chronic problem. The current episode started more than 1 year ago. Exacerbating diseases include diabetes and obesity. Pertinent negatives include no chest pain, myalgias or shortness of breath. Current antihyperlipidemic treatment includes bile acid squestrants. Risk factors for coronary artery disease include diabetes mellitus, dyslipidemia, hypertension and obesity.  Hypertension This is a chronic problem. The current episode started more than 1 year ago. Pertinent negatives include no chest pain, headaches, palpitations or shortness of breath. Risk factors for coronary artery disease include diabetes mellitus, dyslipidemia, obesity, sedentary lifestyle and smoking/tobacco exposure. Past treatments include ACE inhibitors. Hypertensive end-organ damage includes kidney disease, CAD/MI and PVD.      Review of Systems  Constitutional: Positive for fatigue. Negative for unexpected weight change.  HENT: Negative for trouble swallowing and voice change.   Eyes: Negative for visual disturbance.  Respiratory: Negative for cough, shortness of breath and wheezing.   Cardiovascular: Negative for chest pain, palpitations and leg swelling.  Gastrointestinal: Negative for nausea, vomiting and diarrhea.  Endocrine: Positive for polydipsia and polyuria. Negative for cold intolerance, heat intolerance and polyphagia.  Musculoskeletal: Negative for myalgias and arthralgias.  Skin: Negative for color change, pallor, rash and wound.  Neurological: Negative for seizures and headaches.  Psychiatric/Behavioral: Negative for suicidal ideas and confusion.    Objective:    BP 130/64 mmHg  Pulse 68  Resp 18  Ht 5\' 8"  (1.727 m)  Wt 198 lb (89.812 kg)  BMI 30.11 kg/m2  SpO2 95%  Wt Readings from Last 3 Encounters:  04/13/16 198 lb (89.812 kg)  03/30/16 193 lb (87.544 kg)  03/16/16 197 lb (89.359 kg)    Physical Exam  Constitutional: She is oriented to person, place, and time. She appears well-developed.  HENT:  Head: Normocephalic and atraumatic.  Eyes: EOM are normal.  Neck: Normal range of motion. Neck supple. No tracheal deviation present. No thyromegaly present.  Cardiovascular: Normal rate and regular rhythm.   Pulmonary/Chest: Effort normal and breath sounds normal.  Abdominal: Soft. Bowel sounds are normal. There is no tenderness. There is no guarding.  Musculoskeletal: Normal range of motion. She exhibits no edema.  Neurological: She is alert and oriented to person, place, and time. She has normal reflexes. No cranial nerve deficit. Coordination normal.  Skin: Skin is warm and dry. No rash noted. No erythema. No pallor.  Psychiatric:  She is significantly reluctant with possible cognitive deficit.     Complete Blood Count (Most recent): Lab Results  Component Value Date    WBC 8.3 11/08/2015   HGB 9.9* 11/08/2015   HCT 32.6* 11/08/2015   MCV 75.8* 11/08/2015   PLT 263 11/08/2015   Chemistry (most recent): Lab Results  Component Value Date   NA 137 01/12/2016   K 4.0 01/12/2016   CL 97* 01/12/2016   CO2 29 01/12/2016   BUN 35* 01/12/2016   CREATININE 2.23* 01/12/2016   Diabetic Labs (most recent): Lab Results  Component Value Date   HGBA1C 11.7* 01/12/2016   HGBA1C 10.7* 03/05/2014   HGBA1C 11.5* 12/18/2013   Lipid Panel     Component Value Date/Time   CHOL 191 12/18/2013 1058   TRIG 186* 12/18/2013 1058   HDL 39* 12/18/2013 1058   CHOLHDL 4.9  12/18/2013 1058   VLDL 37 12/18/2013 1058   LDLCALC 115* 12/18/2013 1058   LDLDIRECT 118.4 08/13/2013 1502     Assessment & Plan:   1. Uncontrolled type 2 diabetes mellitus with other specified complication ([place])  - Her  diabetes is  complicated by coronary artery disease, chronic kidney disease, peripheral neuropathy, and peripheral arterial disease and patient remains at a high risk for more acute and chronic complications of diabetes which include CAD, CVA, CKD, retinopathy, and neuropathy. These are all discussed in detail with the patient.  Patient came with Better  glucose profile on premixed insulin NovoLog 70/30 . her recent A1c is higher at 11.7 % up from  10.7%. Recent labs reviewed.   - I have re-counseled the patient on diet management  by adopting a carbohydrate restricted / protein rich  Diet.  - Suggestion is made for patient to avoid simple carbohydrates   from their diet including Cakes , Desserts, Ice Cream,  Soda (  diet and regular) , Sweet Tea , Candies,  Chips, Cookies, Artificial Sweeteners,   and "Sugar-free" Products .  This will help patient to have stable blood glucose profile and potentially avoid unintended  Weight gain.  - Patient is advised to stick to a routine mealtimes to eat 3 meals  a day and avoid unnecessary snacks ( to snack only to correct  hypoglycemia).  - The patient  has been  scheduled with Jearld Fenton, RDN, CDE for individualized DM education.  - I have approached patient with the following individualized plan to manage diabetes and patient agrees.   -Mrs. Fusilier  would have used basal/bolus insulin however this was too overwhelming for her.  she lives alone. - She came with slightly better blood glucose profile.  She will be continued on premixed insulin Novolog 70/30, increase to 40 units with breakfast and 30 units with supper for pre-meal blood glucose above 90 mg/dL. I have taken away all of the insulin production protein to avoid inadvertent insulin overdose and hypoglycemia.  -Patient is encouraged to call clinic for blood glucose levels less than 70 or above 300 mg /dl.  -She will be taken off of Victoza.   - Patient specific target  for A1c; LDL, HDL, Triglycerides, and  Waist Circumference were discussed in detail.  2) BP/HTN: Uncontrolled, patient could not confirm if she is consistent taking her blood pressure medications. I advised her to resume her current medications including ACEI/ARB. 3) Lipids/HPL:  continue ezetimibe, continue Crestor 20 mg by mouth daily at bedtime.   4)  Weight/Diet: CDE consult in progress, exercise, and carbohydrates information provided.  5) Chronic Care/Health Maintenance:  -Patient  on ACEI/ARB and Statin medications and encouraged to continue to follow up with Ophthalmology, Podiatrist at least yearly or according to recommendations, and advised to  stay away from smoking. I have recommended yearly flu vaccine and pneumonia vaccination at least every 5 years; moderate intensity exercise for up to 150 minutes weekly; and  sleep for at least 7 hours a day.  - 25 minutes of time was spent on the care of this patient , 50% of which was applied for counseling on diabetes complications and their preventions.  - I advised patient to maintain close follow up with [name],  MD for primary care needs.  Patient is asked to bring meter and  blood glucose logs during their next visit.   Follow up plan: -Return in about 4 weeks (around 05/11/2016) for follow up with  pre-visit labs, meter, and logs.  Glade Lloyd, MD Phone: [phone]  Fax: [phone]   04/13/2016, 11:56 AM

## 2016-04-13 NOTE — Patient Outreach (Signed)
Assessment:  CSW spoke via phone with client on 04/13/16. CSW verified client identity. CSW and client spoke of client needs. Client sees Dr. Berdine [name] as primary care doctor. She also sees Dr. Dorris [name] as endocrinologist.  She reports no transport issues in going to and from her scheduled medical appointments. She has her prescribed medications and is taking medications as prescribed.  She reports to CSW that she continues to have in home aide support weekly as scheduled. She reports that in home aide assistance is very helpful to her and that she wants to remain at her current residence.  She has talked with CSW and RN about level of care options for client.. But, Krystal [name] reported that she still wants to remain at home currently with support from home health aid weekly as scheduled.  Client receives [place] support with RN Krystal [name]. CSW informed client on 04/13/16 that client had met her care plan goal with CSW services. She wants to remain at her home with weekly support in place. Client said she feels safe and is not aware of other community resources she needs at present. She did not speak of any other current CSW needs. Thus, CSW informed Krystal [name] on 04/13/16 that [place] would discharge client on 04/13/16 from [place] services only. Krystal [name] agreed to this plan. Client will continue to receive [facility] nursing support with RN Krystal [name].  CSW thanked Krystal [name] for phone call with CSW on 04/13/16.      Plan:  CSW is discharging [place]. [name] from [place] only on 04/13/16 since client has met her care plan goals with CSW services. CSW to inform Krystal [name] that [place] discharged client on 04/13/16 from [place] services. CSW to fax letter to Dr. Berdine [name] on 04/13/16 informing Dr. Berdine [name] that Krystal [name] discharged client from [facility] CSW services only on 04/13/16.  Krystal [name].Krystal [name] MSW, LCSW Licensed Clinical Social Worker [facility] Care Management [phone]

## 2016-04-17 ENCOUNTER — Other Ambulatory Visit: Payer: Self-pay | Admitting: Pharmacist

## 2016-04-17 NOTE — Patient Outreach (Signed)
[facility]) Care Management  04/17/2016  [name] [birth year] [id number]    Care coordination:  Krystal [name] is a 80 yo who I am following for medication management.  Following my [name] recent home visit on 03/26/16, patient was to receive a phone call from [facility] to learn more about bubble packaging/adherence packaging of her medications.  Phone call to Krystal [name] to follow up about enrollment in bubble packaging services.  Spoke to Krystal [name] who reports she did not receive a phone call from [facility] about bubble packaging of medications.  I completed phone call to Krystal [name] to learn more about bubble packaging.  Spoke to Krystal [name] who is in charge of bubble packaging medications.  I then completed call to Krystal [name] to explain [place] system for bubble packaging medications.  Patient reports she does want to enroll in this service.  Patient currently has her pill boxes filled through 04/23/16.    Plan:  Patient will call Krystal [name] and ask to speak to Krystal [name] in order to enroll in bubble packaging of medications.  Patient plans to have the first month of bubble packaged medications delivered by Monday, 04/23/16.  Home visit scheduled on 04/23/16.     Krystal [name], Pharm.D. Pharmacy Resident [place] [phone]

## 2016-04-18 ENCOUNTER — Other Ambulatory Visit: Payer: Self-pay | Admitting: Cardiovascular Disease

## 2016-04-18 DIAGNOSIS — E559 Vitamin D deficiency, unspecified: Secondary | ICD-10-CM | POA: Diagnosis not present

## 2016-04-18 DIAGNOSIS — N2581 Secondary hyperparathyroidism of renal origin: Secondary | ICD-10-CM | POA: Diagnosis not present

## 2016-04-18 DIAGNOSIS — R809 Proteinuria, unspecified: Secondary | ICD-10-CM | POA: Diagnosis not present

## 2016-04-18 DIAGNOSIS — N184 Chronic kidney disease, stage 4 (severe): Secondary | ICD-10-CM | POA: Diagnosis not present

## 2016-04-19 ENCOUNTER — Other Ambulatory Visit: Payer: Self-pay | Admitting: "Endocrinology

## 2016-04-19 ENCOUNTER — Other Ambulatory Visit: Payer: Self-pay | Admitting: Cardiovascular Disease

## 2016-04-23 ENCOUNTER — Other Ambulatory Visit: Payer: Self-pay

## 2016-04-23 ENCOUNTER — Other Ambulatory Visit: Payer: Self-pay | Admitting: Pharmacist

## 2016-04-23 ENCOUNTER — Telehealth: Payer: Self-pay | Admitting: Cardiovascular Disease

## 2016-04-23 ENCOUNTER — Other Ambulatory Visit: Payer: Self-pay | Admitting: *Deleted

## 2016-04-23 MED ORDER — NEBIVOLOL HCL 5 MG PO TABS: 5.0000 mg | ORAL_TABLET | Freq: Two times a day (BID) | ORAL | Status: DC

## 2016-04-23 NOTE — Telephone Encounter (Signed)
rx changed back to Bystolic 5 mg BID as previous refill was incorrect,I spoke with [facility] pharmacy and rachel with [facility]

## 2016-04-23 NOTE — Patient Outreach (Addendum)
[facility]) Care Management   04/23/2016  Krystal [name] [birth year] [id number]  Krystal [name] is an 80 y.o. female  Subjective:  "do you know where I can get depends for free?" "My lower stomach and lower back have been bothering me for a while" "I had 2 episodes of leaking with diarrhea last week"  Patient saw Dr. Dorris Fetch, he is still seeing patient monthly for diabetic monitoring Blood sugars remain high at times.  Patient reports she has had 2 close family/friends death's in the last couple of weeks and it has "kept me on the phone" so patient forgot to check blood sugar today.  Patient to get new medication adherence packaging from [facility], patient states she is doing better with medication except for evening medications. [facility] pharmacist set up alarm on phone for 9pm to help remind patient to take her medications.  Objective:   Patient neatly cleaned and dressed BP 132/68 mmHg  Pulse 60  Resp 20  Wt 195 lb (88.451 kg)  SpO2 98% Review of Systems  Constitutional: Negative.   HENT: Negative.   Eyes: Negative.   Respiratory: Negative.   Cardiovascular: Negative.   Gastrointestinal: Positive for diarrhea.  Genitourinary: Positive for urgency and frequency.       Incontinence at times  Musculoskeletal: Positive for back pain.  Skin: Negative.   Neurological: Negative.   Endo/Heme/Allergies: Negative.     Physical Exam  Constitutional: She is oriented to person, place, and time. She appears well-developed and well-nourished.  Cardiovascular: Normal rate and regular rhythm.   Respiratory: Effort normal.  GI: Soft. Bowel sounds are normal.  Musculoskeletal: Normal range of motion.  Neurological: She is alert and oriented to person, place, and time.  Skin: Skin is warm.    Encounter Medications:   Outpatient Encounter Prescriptions as of 04/23/2016  Medication Sig  . aspirin 81 MG EC tablet Take 81 mg by mouth every morning.   Marland Kitchen BYSTOLIC 10  MG tablet TAKE (1/2) TABLET BY MOUTH DAILY.  . calcitRIOL (ROCALTROL) 0.25 MCG capsule Take 0.25 mcg by mouth daily. Reported on 03/30/2016  . clopidogrel (PLAVIX) 75 MG tablet Take 1 tablet (75 mg total) by mouth daily.  Marland Kitchen ezetimibe (ZETIA) 10 MG tablet Take 1 tablet (10 mg total) by mouth every morning.  . famotidine (PEPCID) 20 MG tablet Take 1 tablet (20 mg total) by mouth daily.  . febuxostat (ULORIC) 40 MG tablet Take 40 mg by mouth daily.  . ferrous sulfate 325 (65 FE) MG tablet Take 325 mg by mouth daily.   . Fluticasone-Salmeterol (ADVAIR DISKUS) 250-50 MCG/DOSE AEPB Inhale 1 puff into the lungs 2 (two) times daily.  Marland Kitchen glucose blood (ONETOUCH VERIO) test strip E11.65 Use 4 times daily as directed  . HYDROcodone-acetaminophen (NORCO/VICODIN) 5-325 MG tablet Take 1 tablet by mouth every 12 (twelve) hours as needed for moderate pain.  Marland Kitchen insulin aspart protamine - aspart (NOVOLOG MIX 70/30 FLEXPEN) (70-30) 100 UNIT/ML FlexPen 30 units w/ breakfast & 40 units w/ supper  . Insulin Pen Needle (B-D ULTRAFINE III SHORT PEN) 31G X 8 MM MISC 1 each by Does not apply route as directed.  . isosorbide mononitrate (IMDUR) 60 MG 24 hr tablet Take 1 tablet (60 mg total) by mouth daily.  Marland Kitchen lisinopril (PRINIVIL,ZESTRIL) 10 MG tablet TAKE (1) TABLET BY MOUTH ONCE DAILY.  . nitroGLYCERIN (NITROSTAT) 0.4 MG SL tablet Place 1 tablet (0.4 mg total) under the tongue every 5 (five) minutes as needed for  chest pain.  . polyethylene glycol (MIRALAX / GLYCOLAX) packet Take 17 g by mouth daily as needed for mild constipation.  Marland Kitchen RANEXA 500 MG 12 hr tablet TAKE (1) TABLET TWICE DAILY.  . rosuvastatin (CRESTOR) 20 MG tablet Take 1 tablet (20 mg total) by mouth every evening.  . torsemide (DEMADEX) 20 MG tablet May take extra 20 mg daily for swelling   No facility-administered encounter medications on file as of 04/23/2016.      Assessment:   HF: patient is weighing daily, weight is stable 195# Diabetes: Patient  blood sugar is up especially over weekends when her aide is not available for meal prep. Reviewed instructions from Dr. Dorris Fetch, patient not calling for increased blood sugars Recent incontinence: Instructed patient to let MD know about episodes, she states she is overdue to go see GI. Incontinence supplies: referred to local pharmacy that may deliver. Also, encouraged patient to let MD know of recent fecal incontinence episodes to follow up.  Plan:  [facility] CM Care Plan Problem One        Most Recent Value   Care Plan Problem One  Knowledge deficit related to diabetes as evidenced by patient questions around diabetes   Role Documenting the Problem One  Care Management [place] for Problem One  Active   THN Long Term Goal (31-90 days)  Patient will verbalize understanding of diabetes management over the next 60 days   Interventions for Problem One Long Term Goal  reviewed Dr. Liliane Channel instructions, reminded patient of upcoming labs   [facility] CM Short Term Goal #1 (0-30 days)  Patient will keep a cbg log showing her bid CBGs and will take to endocrinologist appointment in the next 21 days   [facility]N CM Short Term Goal #1 Start Date  03/30/16 [goal continued]   Interventions for Short Term Goal #1  Reviewed Dr. Dorris Fetch instrcations, reminded patient to call with CBG's over 300 x3 in a row. Reviewed possible causes of elevated CBGs such as stress, diet     Visit again after patient sees Dr. Dorris Fetch and continue diabetes education Patient to let MD know of recent issues with incontinence. Patient to check CBGs and let MD know if continues to be over 300 as instructed.   [name]. [name], RN, BSN, [place] [phone]

## 2016-04-23 NOTE — Telephone Encounter (Signed)
pls call [place] w/ [facility] concerning the pt's BYSTOLIC 10 MG tablet [id number] Apolonio Schneiders can be reached @ [phone]

## 2016-04-23 NOTE — Patient Outreach (Signed)
[name]) Care Management  [place]   04/23/2016  Krystal [name] 07-May-1936 [id number]  Subjective: Krystal [name] is a 80 yo who was referred to [name] for medication management.  I made follow up home visit today.  Visit was made with [name] CMRN [name].    Patient continues to fill her prescriptions at [name] in [place] and uses their delivery.  Patient confirms she has decided to have her medications bubble packed by [name].  Patient states she thinks medications are supposed to be delivered today.    Patient reports adherence with her medications, but does admit to still forgetting to take her medications in the evening.  Patient reports adherence with insulin regimen of Novolog 70/30 mix 40 units with breakfast and 30 units with supper and Advair 1 puff BID.    Objective:   Encounter Medications: Outpatient Encounter Prescriptions as of 04/23/2016  Medication Sig  . aspirin 81 MG EC tablet Take 81 mg by mouth every morning.   Marland Kitchen BYSTOLIC 10 MG tablet TAKE (1/2) TABLET BY MOUTH DAILY.  . calcitRIOL (ROCALTROL) 0.25 MCG capsule Take 0.25 mcg by mouth daily. Reported on 03/30/2016  . clopidogrel (PLAVIX) 75 MG tablet Take 1 tablet (75 mg total) by mouth daily.  Marland Kitchen ezetimibe (ZETIA) 10 MG tablet Take 1 tablet (10 mg total) by mouth every morning.  . famotidine (PEPCID) 20 MG tablet Take 1 tablet (20 mg total) by mouth daily.  . febuxostat (ULORIC) 40 MG tablet Take 40 mg by mouth daily.  . ferrous sulfate 325 (65 FE) MG tablet Take 325 mg by mouth daily.   . Fluticasone-Salmeterol (ADVAIR DISKUS) 250-50 MCG/DOSE AEPB Inhale 1 puff into the lungs 2 (two) times daily.  Marland Kitchen glucose blood (ONETOUCH VERIO) test strip E11.65 Use 4 times daily as directed  . HYDROcodone-acetaminophen (NORCO/VICODIN) 5-325 MG tablet Take 1 tablet by mouth every 12 (twelve) hours as needed for moderate pain.  Marland Kitchen insulin aspart protamine -  aspart (NOVOLOG MIX 70/30 FLEXPEN) (70-30) 100 UNIT/ML FlexPen 30 units w/ breakfast & 40 units w/ supper  . Insulin Pen Needle (B-D ULTRAFINE III SHORT PEN) 31G X 8 MM MISC 1 each by Does not apply route as directed.  . isosorbide mononitrate (IMDUR) 60 MG 24 hr tablet Take 1 tablet (60 mg total) by mouth daily.  Marland Kitchen lisinopril (PRINIVIL,ZESTRIL) 10 MG tablet TAKE (1) TABLET BY MOUTH ONCE DAILY.  . nitroGLYCERIN (NITROSTAT) 0.4 MG SL tablet Place 1 tablet (0.4 mg total) under the tongue every 5 (five) minutes as needed for chest pain.  . polyethylene glycol (MIRALAX / GLYCOLAX) packet Take 17 g by mouth daily as needed for mild constipation.  Marland Kitchen RANEXA 500 MG 12 hr tablet TAKE (1) TABLET TWICE DAILY.  . rosuvastatin (CRESTOR) 20 MG tablet Take 1 tablet (20 mg total) by mouth every evening.  . torsemide (DEMADEX) 20 MG tablet May take extra 20 mg daily for swelling  . [DISCONTINUED] insulin aspart protamine - aspart (NOVOLOG MIX 70/30 FLEXPEN) (70-30) 100 UNIT/ML FlexPen Inject 0.3 mLs (30 Units total) into the skin 2 (two) times daily.   No facility-administered encounter medications on file as of 04/23/2016.   Functional Status: In your present state of health, do you have any difficulty performing the following activities: 04/23/2016 02/29/2016  Hearing? N N  Vision? Y Y  Difficulty concentrating or making decisions? N N  Walking or climbing stairs? Y Y  Dressing or bathing?  N N  Doing errands, shopping? Krystal [name]  Preparing Food and eating ? N N  Using the Toilet? N N  In the past six months, have you accidently leaked urine? Y Y  Do you have problems with loss of bowel control? Y N  Managing your Medications? Y Y  Managing your Finances? Y N  Housekeeping or managing your Housekeeping? Krystal [name]   Fall/Depression Screening: PHQ 2/9 Scores 04/13/2016 03/16/2016 02/23/2016 02/01/2016 01/20/2016 12/06/2015 10/26/2015  PHQ - 2 Score 0 0 0 0 0 0 0   Assessment: 1.  Medication management:  Patient has enrolled  in [name] bubble packing system for medications.  Patient states she thinks medications are supposed to be delivered today.  I placed a phone call to [name] to check on the status of her medications.  I spoke to [name] who reports Krystal [name]'s mediations are scheduled to be filled today and delivered tomorrow.    I reviewed all the medications to be placed in the bubble pack.  Noted a dosing discrepancy with patient's Bystolic.  [name] states their prescription on file is for Bystolic 10 mg - 1/2 tablet daily.  Patient has currently been taking Bystolic 10 mg - 1/2 tablet BID.  I made a phone call to patient's cardiology office and confirmed Bystolic dose is 5 mg BID.  An updated prescription was sent by cardiology office to [facility].  During the review of medications I also noted that the following prescriptions were not set to be filled in the bubble pack: famotidine, ezetimibe, and rosuvastatin.  [name] reports they will fill these prescriptions to include in the pill pack.    I again reminded patient that Uloric will not be included in the bubble pack since she receives this medication from the manufacturer.  I reminded patient that she needs to take Uloric 1 tablet daily in addition to the medications in her bubble pack.  She voiced understanding.    Patient continues to use weekly pill boxes for her medications until her bubble packs are delivered.  Patient only had enough medications to fill her pill boxes for 3 additional days.  I assisted patient in filling her pill boxes through 04/26/16 with the following medications:  aspirin, clopidogrel, ezetimibe, famotidine, ferrous sulfate, isosorbide mononitrate, lisinopril, nebivolol, ranolazine, rosuvastatin, and torsemide.    2.  Medication adherence:  Patient continues to have difficulty remembering to take her evening medications.  Patient has been 100% adherence with her morning  medications, but has missed 7 doses in the past 4 weeks of her evening medications (~75% adherent).  Patient confirms she has been placing her pill box beside her bed to help remind her to take her evening medications, but patient reports she still forgets sometimes.  Discussed other methods to increase adherence such as setting an alarm on patient's phone to remind her to take her medications.  Patient was agreeable, and alarm was set on her phone at 9 PM to remind her to take her evening medications.    Plan: 1.  Patient to continue to take all medications as prescribed using weekly pill boxes until bubble packs are delivered by [facility].  Bubble packs are set to be delivered on 04/24/16.  Advised patient to call me if she has any questions regarding the bubble packs once they are delivered.   2.  Patient to use alarm on her phone at 9 PM to assist in reminding her to  take her evening medications.  Set up alarm for patient and counseled her on how to use it.   3.  Follow up home visit scheduled for 05/04/16.    THN CM Care Plan Problem One        Most Recent Value   Care Plan Problem One  Medication management   Role Documenting the Problem One  Clinical Pharmacist   Care Plan for Problem One  Active   THN Long Term Goal (31-90 days)  Patient will develop a system for self-managing her medications in the next 90 days.    THN Long Term Goal Start Date  01/20/16 [goal renewed]   Interventions for Problem One Long Term Goal  Patient has requested to enroll in bubble packing system at [facility].  Spoke to pharmacy today and patient's bubble packs are set up to be filled and delivered today.  Reviewed medications to be included in the bubble pack.  Reviewed bubble pack system with patient and explained the way the pack will be set up.     THN CM Care Plan Problem Two        Most Recent Value   Care Plan Problem Two  Medication adherence   Role Documenting the Problem Two   Clinical Pharmacist   Care Plan for Problem Two  Active   THN CM Short Term Goal #1 (0-30 days)  Patient will take her medications as prescribed over the next two weeks.    [facility]N CM Short Term Goal #1 Start Date  04/23/16   Interventions for Short Term Goal #2   Discussed the importance of taking medications daily as prescribed.  Patient is 100% adherent with her morning medications, but has missed 7 doses of her evening medications in the past 4 weeks.  Assisted patient in setting an alarm on her phone for 9 PM to remind her daily to take her evening medications.      [name], Pharm.D. Pharmacy Resident [place] [phone]

## 2016-04-24 ENCOUNTER — Encounter (HOSPITAL_COMMUNITY): Payer: Medicare Other

## 2016-04-26 ENCOUNTER — Other Ambulatory Visit: Payer: Self-pay | Admitting: *Deleted

## 2016-04-26 NOTE — Patient Outreach (Signed)
Call received from patient, she was trying to outreach [facility] pharmacist. Patient going out of town for a funeral tomorrow and has received her pill packs but states there are some medications missing out of the packs.  Call to [facility], Engineer, production, reviewed notes and noted that crestor, famotidine and zetia would not be in packs due to timing of refill and also uloric would not be in pack due to getting from pharmacy assistance program.  Call back to patient to relay above information, no answer to phone and no answering machine to leave a message. Will continue to attempt to outreach. [name]. [name], RN, BSN, [place] [phone]

## 2016-04-27 NOTE — Patient Outreach (Signed)
Multiple attempts to contact patient. No answer VM left requesting call back. Plan to await call back. [name]. [name], RN, BSN, [place] [phone]

## 2016-04-30 ENCOUNTER — Other Ambulatory Visit: Payer: Self-pay | Admitting: Pharmacist

## 2016-04-30 NOTE — Patient Outreach (Signed)
[facility]) Care Management  04/30/2016  TIMYA QUARTARARO 1936-07-04 [id number]   Bozena Silveria is a 80 yo who I am following for medication management.  Phone call to patient to follow up on pill packs.  Patient confirms pill packs were delivered by [facility] last week.  Patient reports some confusion over her pill packs.    Assessment/Plan: I had home visit scheduled for 05/04/16.  Will change home visit to today to review contents of pill pack with patient.     [name], Pharm.D. Pharmacy Resident [place] [phone]

## 2016-04-30 NOTE — Patient Outreach (Signed)
[facility]) Care Management  [place]   04/30/2016  SNEHA [name] 09/20/1936 [id number]  Subjective: Krystal [name] is a 80 yo who was referred to [facility] for medication management.  I made follow up home visit today to review bubble packs with patient.    Patient has recently started having her medications bubble packed by [facility] in [place].  Four weeks of medications are bubble packed for patient and delivered to her home.  Patient's medications were delivered on 04/26/16.    Patient reports adherence with medications, but does admit to missing several doses of her evening medications over the weekend because she was out of town for a funeral.  Patient reports adherence with insulin regimen of Novolog 70/30 Mix 40 units with breakfast and 30 units with supper and Advair 1 puff BID.   Objective:   Encounter Medications: Outpatient Encounter Prescriptions as of 04/30/2016  Medication Sig Note  . aspirin 81 MG EC tablet Take 81 mg by mouth every morning.    . calcitRIOL (ROCALTROL) 0.25 MCG capsule Take 0.25 mcg by mouth daily. Reported on 03/30/2016   . clopidogrel (PLAVIX) 75 MG tablet Take 1 tablet (75 mg total) by mouth daily.   Marland Kitchen ezetimibe (ZETIA) 10 MG tablet Take 1 tablet (10 mg total) by mouth every morning.   . famotidine (PEPCID) 20 MG tablet Take 1 tablet (20 mg total) by mouth daily.   . febuxostat (ULORIC) 40 MG tablet Take 40 mg by mouth daily.   . ferrous sulfate 325 (65 FE) MG tablet Take 325 mg by mouth daily.    . Fluticasone-Salmeterol (ADVAIR DISKUS) 250-50 MCG/DOSE AEPB Inhale 1 puff into the lungs 2 (two) times daily.   Marland Kitchen glucose blood (ONETOUCH VERIO) test strip E11.65 Use 4 times daily as directed   . HYDROcodone-acetaminophen (NORCO/VICODIN) 5-325 MG tablet Take 1 tablet by mouth every 12 (twelve) hours as needed for moderate pain. 04/30/2016: Reports taking as needed - has not taken in the past week   . insulin  aspart protamine - aspart (NOVOLOG MIX 70/30 FLEXPEN) (70-30) 100 UNIT/ML FlexPen 30 units w/ breakfast & 40 units w/ supper (Patient taking differently: 40 units w/ breakfast & 30 units w/ supper)   . Insulin Pen Needle (B-D ULTRAFINE III SHORT PEN) 31G X 8 MM MISC 1 each by Does not apply route as directed.   . isosorbide mononitrate (IMDUR) 60 MG 24 hr tablet Take 1 tablet (60 mg total) by mouth daily.   Marland Kitchen lisinopril (PRINIVIL,ZESTRIL) 10 MG tablet TAKE (1) TABLET BY MOUTH ONCE DAILY.   . nebivolol (BYSTOLIC) 5 MG tablet Take 1 tablet (5 mg total) by mouth 2 (two) times daily.   . nitroGLYCERIN (NITROSTAT) 0.4 MG SL tablet Place 1 tablet (0.4 mg total) under the tongue every 5 (five) minutes as needed for chest pain. 04/30/2016: Has on hand if needed.   . polyethylene glycol (MIRALAX / GLYCOLAX) packet Take 17 g by mouth daily as needed for mild constipation. 04/30/2016: Takes if needed  . RANEXA 500 MG 12 hr tablet TAKE (1) TABLET TWICE DAILY.   . rosuvastatin (CRESTOR) 20 MG tablet Take 1 tablet (20 mg total) by mouth every evening.   . torsemide (DEMADEX) 20 MG tablet May take extra 20 mg daily for swelling    No facility-administered encounter medications on file as of 04/30/2016.   Functional Status: In your present state of health, do you have any difficulty performing the  following activities: 04/23/2016 02/29/2016  Hearing? N N  Vision? Y Y  Difficulty concentrating or making decisions? N N  Walking or climbing stairs? Y Y  Dressing or bathing? N N  Doing errands, shopping? [name]  Preparing Food and eating ? N N  Using the Toilet? N N  In the past six months, have you accidently leaked urine? Y Y  Do you have problems with loss of bowel control? Y N  Managing your Medications? Y Y  Managing your Finances? Y N  Housekeeping or managing your Housekeeping? [name]   Fall/Depression Screening: PHQ 2/9 Scores 04/13/2016 03/16/2016 02/23/2016 02/01/2016 01/20/2016 12/06/2015 10/26/2015  PHQ - 2 Score  0 0 0 0 0 0 0    Assessment: 1.  Medication management:  Patient received bubble packs from [facility].  Bubble packs are filled through 05/22/16.  I reviewed all the medications in patient's bubble packs.  All medications were placed appropriately in bubble pack; however noted that several medications were missing including famotidine, ferrous sulfate, and rosuvastatin.  I made a phone call to [facility] to discuss these medications.  I spoke to [facility] who states ferrous sulfate was not included in the pill pack because patient just received a 30-day supply of this medication on 04/19/16.  Nira Conn reports ferrous sulfate will be included in the pill pack for July.  Patient has adequate supply of ferrous sulfate at this time.  Heather states famotidine and rosuvastatin were not included in the pill pack because patient was out of refills and refill requests were not approved.  I identified that famotidine refill request was being sent to patient's cardiologist, and rosuvastatin refill request was being sent to Dr. Marin Comment, who discharged patient from the hospital in October 2016.  I requested for refill requests to be sent to patient's PCP, Dr. Berdine Addison.  I also completed phone call to Dr. Cathey Endow office and left a message on his nursing line requesting refills of famotidine and rosuvastatin for patient.  Patient currently has 3 days worth of famotidine and 8 days worth of rosuvastatin.  Nira Conn reports that when refills are approved they will deliver enough famotidine and rosuvastatin to last patient until 05/22/16 when her next pill pack is due.  Counseled patient to take famotidine, ferrous sulfate, and rosuvastatin out of the bottles at this time in addition to her morning medications in the pill pack.  Patient voiced understanding.  Patient is aware that these medications should be included in her July pill pack.    Of note, Uloric is also not included in patient's bubble pack.  Patient is  aware that Uloric is not included in her pill pack since she receives this medication from the patient assistance program.  Patient confirms she has continued to take this medication (1 tablet daily) out of the bottle from the patient assistance program.  Patient confirms she called the patient assistance program and requested a refill of Uloric last week.    2.  Medication adherence:  Patient reports using the alarm on her phone has improved her adherence with her evening medications.  Patient does admit to missing several doses of her evening medications over the weekend because she was out of town.  Will reassess adherence at next visit.    Plan: 1.  Patient to continue to take all medications as prescribed using weekly bubble packs filled by [facility].  Patient is aware she needs to take famotidine, ferrous sulfate, rosuvastatin, and Uloric  out of the pill bottles in addition to the medications in her morning bubble pack.   2.  Patient to receive refill of famotidine and rosuvastatin from the pharmacy when refills are approved by PCP.  Advised patient to call me if she has any questions or concerns or if she does not receive refills this week.   3.  Patient to continue to use alarm on her phone at 9 PM to assist in reminding her to take her evening medications.   4.  Follow up home visit scheduled for 05/23/16.    [facility] CM Care Plan Problem One        Most Recent Value   Care Plan Problem One  Medication management   Role Documenting the Problem One  Clinical Pharmacist   Care Plan for Problem One  Not Active   [facility] Long Term Goal (31-90 days)  Patient will develop a system for self-managing her medications in the next 90 days.    [facility] Long Term Goal Start Date  01/20/16 [goal renewed]   Interventions for Problem One Long Term Goal  Patient has enrolled in bubble packaging system at [facility].  Medications will be automatically refilled each month and delivered to patient.       [facility] CM Care Plan Problem Two        Most Recent Value   Care Plan Problem Two  Medication adherence   Role Documenting the Problem Two  Clinical Pharmacist   Care Plan for Problem Two  Active   [facility] CM Short Term Goal #1 (0-30 days)  Patient will take her medications as prescribed over the next thirty days.    [facility] CM Short Term Goal #1 Start Date  04/23/16   Interventions for Short Term Goal #2   Discussed the importance of taking medications daily as prescribed.  Reviewed bubble pack system with patient and counseled her on how to take her medications using bubble pack.  Patient reports she continue to use alarm on her phone to remind her to take her medications at night and reports that has helped with adherence.      [name], Pharm.D. Pharmacy Resident [place] [phone]

## 2016-05-01 ENCOUNTER — Encounter (HOSPITAL_COMMUNITY): Payer: Medicare Other

## 2016-05-01 DIAGNOSIS — M2011 Hallux valgus (acquired), right foot: Secondary | ICD-10-CM | POA: Diagnosis not present

## 2016-05-01 DIAGNOSIS — I739 Peripheral vascular disease, unspecified: Secondary | ICD-10-CM | POA: Diagnosis not present

## 2016-05-01 DIAGNOSIS — M2012 Hallux valgus (acquired), left foot: Secondary | ICD-10-CM | POA: Diagnosis not present

## 2016-05-01 DIAGNOSIS — E1051 Type 1 diabetes mellitus with diabetic peripheral angiopathy without gangrene: Secondary | ICD-10-CM | POA: Diagnosis not present

## 2016-05-01 DIAGNOSIS — L603 Nail dystrophy: Secondary | ICD-10-CM | POA: Diagnosis not present

## 2016-05-04 ENCOUNTER — Ambulatory Visit: Payer: Medicare Other | Admitting: Pharmacist

## 2016-05-07 ENCOUNTER — Other Ambulatory Visit: Payer: Self-pay | Admitting: "Endocrinology

## 2016-05-07 DIAGNOSIS — Z794 Long term (current) use of insulin: Secondary | ICD-10-CM | POA: Diagnosis not present

## 2016-05-07 DIAGNOSIS — E1122 Type 2 diabetes mellitus with diabetic chronic kidney disease: Secondary | ICD-10-CM | POA: Diagnosis not present

## 2016-05-07 DIAGNOSIS — E785 Hyperlipidemia, unspecified: Secondary | ICD-10-CM | POA: Diagnosis not present

## 2016-05-07 DIAGNOSIS — N183 Chronic kidney disease, stage 3 (moderate): Secondary | ICD-10-CM | POA: Diagnosis not present

## 2016-05-07 LAB — COMPLETE METABOLIC PANEL WITH GFR
ALBUMIN: 3.9 g/dL (ref 3.6–5.1)
ALK PHOS: 74 U/L (ref 33–130)
ALT: 15 U/L (ref 6–29)
AST: 15 U/L (ref 10–35)
BILIRUBIN TOTAL: 0.4 mg/dL (ref 0.2–1.2)
BUN: 42 mg/dL — AB (ref 7–25)
CALCIUM: 9.4 mg/dL (ref 8.6–10.4)
CHLORIDE: 95 mmol/L — AB (ref 98–110)
CO2: 33 mmol/L — ABNORMAL HIGH (ref 20–31)
CREATININE: 2.27 mg/dL — AB (ref 0.60–0.93)
GFR, EST AFRICAN AMERICAN: 23 mL/min — AB (ref >=60)
GFR, Est Non African American: 20 mL/min — ABNORMAL LOW (ref >=60)
Glucose, Bld: 299 mg/dL — ABNORMAL HIGH (ref 65–99)
Potassium: 4 mmol/L (ref 3.5–5.3)
Sodium: 138 mmol/L (ref 135–146)
Total Protein: 6.9 g/dL (ref 6.1–8.1)

## 2016-05-07 LAB — T4, FREE: FREE T4: 1.2 ng/dL (ref 0.8–1.8)

## 2016-05-07 LAB — LIPID PANEL
Cholesterol: 151 mg/dL (ref 125–200)
HDL: 37 mg/dL — ABNORMAL LOW (ref >=46)
LDL CALC: 71 mg/dL (ref <130)
Total CHOL/HDL Ratio: 4.1 Ratio (ref <=5.0)
Triglycerides: 217 mg/dL — ABNORMAL HIGH (ref <150)
VLDL: 43 mg/dL — AB (ref <30)

## 2016-05-07 LAB — HEMOGLOBIN A1C
HEMOGLOBIN A1C: 12.2 % — AB (ref <5.7)
MEAN PLASMA GLUCOSE: 303 mg/dL

## 2016-05-07 LAB — TSH: TSH: 3.8 m[IU]/L

## 2016-05-08 LAB — MICROALBUMIN / CREATININE URINE RATIO
CREATININE, URINE: 88 mg/dL (ref 20–320)
MICROALB UR: 62.2 mg/dL — AB
Microalb Creat Ratio: 707 mcg/mg creat — ABNORMAL HIGH (ref <30)

## 2016-05-11 ENCOUNTER — Ambulatory Visit (INDEPENDENT_AMBULATORY_CARE_PROVIDER_SITE_OTHER): Payer: Medicare Other | Admitting: "Endocrinology

## 2016-05-11 ENCOUNTER — Encounter: Payer: Self-pay | Admitting: "Endocrinology

## 2016-05-11 VITALS — BP 139/73 | HR 66 | Ht 68.0 in | Wt 198.0 lb

## 2016-05-11 DIAGNOSIS — Z794 Long term (current) use of insulin: Secondary | ICD-10-CM | POA: Diagnosis not present

## 2016-05-11 DIAGNOSIS — E785 Hyperlipidemia, unspecified: Secondary | ICD-10-CM | POA: Diagnosis not present

## 2016-05-11 DIAGNOSIS — I10 Essential (primary) hypertension: Secondary | ICD-10-CM | POA: Diagnosis not present

## 2016-05-11 DIAGNOSIS — E1122 Type 2 diabetes mellitus with diabetic chronic kidney disease: Secondary | ICD-10-CM

## 2016-05-11 DIAGNOSIS — N183 Chronic kidney disease, stage 3 (moderate): Secondary | ICD-10-CM | POA: Diagnosis not present

## 2016-05-11 NOTE — Patient Instructions (Signed)

## 2016-05-11 NOTE — Progress Notes (Signed)
Subjective:    Patient ID: Krystal [name], female    DOB: 1936/09/29, PCP [name], MD   Past Medical History  Diagnosis Date  . Sleep apnea     CPAP  . Myalgia   . Palpitations   . Microcytic anemia     History of occult blood in stool  . DJD (degenerative joint disease)   . CAD (coronary artery disease) 2009    5 stents- #3 in RCA, #1 each in LAD and AVG  . Osteoarthritis   . Essential hypertension   . Hyperlipidemia   . GERD (gastroesophageal reflux disease)   . Allergic rhinitis   . Personal history of colonic polyps   . HOH (hard of hearing)   . MI (myocardial infarction) ([place]) 1998  . Glaucoma   . Diastolic dysfunction, left ventricle   . CKD (chronic kidney disease) stage 3, GFR 30-59 ml/min   . Emphysema lung (HCC)     2L N/C continuously  . Complete heart block, transient 2014  . Gout   . Type II diabetes mellitus with nephropathy ([place])   . History of uterine cancer    Past Surgical History  Procedure Laterality Date  . Ectopic pregnancy surgery    . Foot surgery      Left and right for callous  . Cervical discectomy      L5 left/hemilminectomy  . Colonoscopy  09/2006    Int hemmorhoids, COMPLICATED BY CARDIOPULMONARY COMPLICATIONS  . Abdominal hysterectomy  04/2010    Uterine cancer,TAHBSO  . Polypectomy  10/22/2011    Internal hemorrhoids/sessile polyp  . Coronary angioplasty  1/99, 1/07, 1/08, 4/09    5 cardiac stents total  . Left and right heart catheterization with coronary angiogram N/A 09/12/2012    Procedure: LEFT AND RIGHT HEART CATHETERIZATION WITH CORONARY ANGIOGRAM;  Surgeon: Sanda Klein, MD;  Location: [facility] CATH LAB;  Service: Cardiovascular;  Laterality: N/A;   Social History   Social History  . Marital Status: Widowed    Spouse Name: N/A  . Number of Children: 0  . Years of Education: 12   Occupational History  .      retired   Social History Main Topics  . Smoking status: Former Smoker -- 0.40 packs/day for 51 years     Types: Cigarettes    Start date: 04/22/1955    Quit date: 10/18/2006  . Smokeless tobacco: Never Used     Comment: quit about 4 yrs ago  . Alcohol Use: No  . Drug Use: No  . Sexual Activity: No   Other Topics Concern  . None   Social History Narrative   Consumes 2 cups of caffeine daily   Outpatient Encounter Prescriptions as of 05/11/2016  Medication Sig  . insulin aspart protamine - aspart (NOVOLOG 70/30 MIX) (70-30) 100 UNIT/ML FlexPen Inject 35-50 Units into the skin 2 (two) times daily before a meal. Inject 50 units just before  breakfast if blood glucose is above 90 and inject 35 units just before  supper if blood glucose is above 90.  Marland Kitchen aspirin 81 MG EC tablet Take 81 mg by mouth every morning.   . calcitRIOL (ROCALTROL) 0.25 MCG capsule Take 0.25 mcg by mouth daily. Reported on 03/30/2016  . clopidogrel (PLAVIX) 75 MG tablet Take 1 tablet (75 mg total) by mouth daily.  Marland Kitchen ezetimibe (ZETIA) 10 MG tablet Take 1 tablet (10 mg total) by mouth every morning.  . famotidine (PEPCID) 20 MG tablet Take  1 tablet (20 mg total) by mouth daily.  . febuxostat (ULORIC) 40 MG tablet Take 40 mg by mouth daily.  . ferrous sulfate 325 (65 FE) MG tablet Take 325 mg by mouth daily.   . Fluticasone-Salmeterol (ADVAIR DISKUS) 250-50 MCG/DOSE AEPB Inhale 1 puff into the lungs 2 (two) times daily.  Marland Kitchen glucose blood (ONETOUCH VERIO) test strip E11.65 Use 4 times daily as directed  . HYDROcodone-acetaminophen (NORCO/VICODIN) 5-325 MG tablet Take 1 tablet by mouth every 12 (twelve) hours as needed for moderate pain.  . Insulin Pen Needle (B-D ULTRAFINE III SHORT PEN) 31G X 8 MM MISC 1 each by Does not apply route as directed.  . isosorbide mononitrate (IMDUR) 60 MG 24 hr tablet Take 1 tablet (60 mg total) by mouth daily.  Marland Kitchen lisinopril (PRINIVIL,ZESTRIL) 10 MG tablet TAKE (1) TABLET BY MOUTH ONCE DAILY.  . nebivolol (BYSTOLIC) 5 MG tablet Take 1 tablet (5 mg total) by mouth 2 (two) times daily.  .  nitroGLYCERIN (NITROSTAT) 0.4 MG SL tablet Place 1 tablet (0.4 mg total) under the tongue every 5 (five) minutes as needed for chest pain.  . polyethylene glycol (MIRALAX / GLYCOLAX) packet Take 17 g by mouth daily as needed for mild constipation.  Marland Kitchen RANEXA 500 MG 12 hr tablet TAKE (1) TABLET TWICE DAILY.  . rosuvastatin (CRESTOR) 20 MG tablet Take 1 tablet (20 mg total) by mouth every evening.  . torsemide (DEMADEX) 20 MG tablet May take extra 20 mg daily for swelling  . [DISCONTINUED] insulin aspart protamine - aspart (NOVOLOG MIX 70/30 FLEXPEN) (70-30) 100 UNIT/ML FlexPen 30 units w/ breakfast & 40 units w/ supper (Patient taking differently: 40 units w/ breakfast & 30 units w/ supper)   No facility-administered encounter medications on file as of 05/11/2016.   ALLERGIES: Allergies  Allergen Reactions  . Morphine Shortness Of Breath and Swelling  . Penicillins Shortness Of Breath and Swelling    Has patient had a PCN reaction causing immediate rash, facial/tongue/throat swelling, SOB or lightheadedness with hypotension: No Has patient had a PCN reaction causing severe rash involving mucus membranes or skin necrosis: No Has patient had a PCN reaction that required hospitalization Yes Has patient had a PCN reaction occurring within the last 10 years: No If all of the above answers are "NO", then may proceed with Cephalosporin use.   . Shellfish Allergy    VACCINATION STATUS: Immunization History  Administered Date(s) Administered  . Influenza Whole 09/23/2007  . Influenza,inj,Quad PF,36+ Mos 08/20/2015  . Influenza-Unspecified 09/19/2013  . Pneumococcal Polysaccharide-23 08/20/2015    Diabetes She presents for her follow-up diabetic visit. She has type 2 diabetes mellitus. Onset time: She was diagnosed at approximate age of 58 years. Her disease course has been worsening. There are no hypoglycemic associated symptoms. Pertinent negatives for hypoglycemia include no confusion,  headaches, pallor or seizures. Associated symptoms include fatigue, polydipsia and polyuria. Pertinent negatives for diabetes include no chest pain and no polyphagia. There are no hypoglycemic complications. Symptoms are worsening. Diabetic complications include heart disease, nephropathy, peripheral neuropathy and PVD. Risk factors for coronary artery disease include dyslipidemia, diabetes mellitus, hypertension, obesity, sedentary lifestyle and tobacco exposure. Current diabetic treatment includes insulin injections (She was asked to bring all of her insulin supplies at home. She managed to bring to bags of various kinds of insulin mainly duplicates of long-acting, premixed, and rapid acting insulin types. At least half of the insulin pens she brought in are expired .). She is following a generally  unhealthy diet. She has had a previous visit with a dietitian. She never participates in exercise. Her home blood glucose trend is fluctuating dramatically (He did brought her meter and log showing above target BG readings , monitoring 2-3 times a day, E AG between 100-170.). Her breakfast blood glucose range is generally >200 mg/dl. Her lunch blood glucose range is generally >200 mg/dl. Her dinner blood glucose range is generally >200 mg/dl. Her overall blood glucose range is >200 mg/dl. An ACE inhibitor/angiotensin II receptor blocker is being taken.  Hyperlipidemia This is a chronic problem. The current episode started more than 1 year ago. Exacerbating diseases include diabetes and obesity. Pertinent negatives include no chest pain, myalgias or shortness of breath. Current antihyperlipidemic treatment includes bile acid squestrants. Risk factors for coronary artery disease include diabetes mellitus, dyslipidemia, hypertension and obesity.  Hypertension This is a chronic problem. The current episode started more than 1 year ago. Pertinent negatives include no chest pain, headaches, palpitations or shortness of  breath. Risk factors for coronary artery disease include diabetes mellitus, dyslipidemia, obesity, sedentary lifestyle and smoking/tobacco exposure. Past treatments include ACE inhibitors. Hypertensive end-organ damage includes kidney disease, CAD/MI and PVD.     Review of Systems  Constitutional: Positive for fatigue. Negative for unexpected weight change.  HENT: Negative for trouble swallowing and voice change.   Eyes: Negative for visual disturbance.  Respiratory: Negative for cough, shortness of breath and wheezing.   Cardiovascular: Negative for chest pain, palpitations and leg swelling.  Gastrointestinal: Negative for nausea, vomiting and diarrhea.  Endocrine: Positive for polydipsia and polyuria. Negative for cold intolerance, heat intolerance and polyphagia.  Musculoskeletal: Negative for myalgias and arthralgias.  Skin: Negative for color change, pallor, rash and wound.  Neurological: Negative for seizures and headaches.  Psychiatric/Behavioral: Negative for suicidal ideas and confusion.    Objective:    BP 139/73 mmHg  Pulse 66  Ht 5\' 8"  (1.727 m)  Wt 198 lb (89.812 kg)  BMI 30.11 kg/m2  Wt Readings from Last 3 Encounters:  05/11/16 198 lb (89.812 kg)  04/23/16 195 lb (88.451 kg)  04/13/16 198 lb (89.812 kg)    Physical Exam  Constitutional: She is oriented to person, place, and time. She appears well-developed.  HENT:  Head: Normocephalic and atraumatic.  Eyes: EOM are normal.  Neck: Normal range of motion. Neck supple. No tracheal deviation present. No thyromegaly present.  Cardiovascular: Normal rate and regular rhythm.   Pulses:      Dorsalis pedis pulses are 0 on the right side, and 0 on the left side.       Posterior tibial pulses are 0 on the right side, and 0 on the left side.  She has absent pulsations on right lateral dorsalis pedis and posterior tibial arteries.  Pulmonary/Chest: Effort normal and breath sounds normal.  Abdominal: Soft. Bowel sounds are  normal. There is no tenderness. There is no guarding.  Musculoskeletal: Normal range of motion. She exhibits no edema.  Neurological: She is alert and oriented to person, place, and time. She has normal reflexes. A sensory deficit is present. No cranial nerve deficit. Coordination normal.  She has significant deficit on monofilament test on bilateral feet.  Skin: Skin is warm and dry. No rash noted. No erythema. No pallor.  Psychiatric:  She is significantly reluctant with possible cognitive deficit.     Complete Blood Count (Most recent): Lab Results  Component Value Date   WBC 8.3 11/08/2015   HGB 9.9* 11/08/2015   HCT  32.6* 11/08/2015   MCV 75.8* 11/08/2015   PLT 263 11/08/2015   Chemistry (most recent): Lab Results  Component Value Date   NA 138 05/07/2016   K 4.0 05/07/2016   CL 95* 05/07/2016   CO2 33* 05/07/2016   BUN 42* 05/07/2016   CREATININE 2.27* 05/07/2016   Diabetic Labs (most recent): Lab Results  Component Value Date   HGBA1C 12.2* 05/07/2016   HGBA1C 11.7* 01/12/2016   HGBA1C 10.7* 03/05/2014   Lipid Panel     Component Value Date/Time   CHOL 151 05/07/2016 1006   TRIG 217* 05/07/2016 1006   HDL 37* 05/07/2016 1006   CHOLHDL 4.1 05/07/2016 1006   VLDL 43* 05/07/2016 1006   LDLCALC 71 05/07/2016 1006   LDLDIRECT 118.4 08/13/2013 1502     Assessment & Plan:   1. Uncontrolled type 2 diabetes mellitus with other specified complication ([place])  - Her  diabetes is  complicated by coronary artery disease, chronic kidney disease, peripheral neuropathy, and peripheral arterial disease and patient remains at a high risk for more acute and chronic complications of diabetes which include CAD, CVA, CKD, retinopathy, and neuropathy. These are all discussed in detail with the patient.  Patient came with Better  glucose profile on premixed insulin NovoLog 70/30 . her recent A1c is higher at 12.2 %. -She admits significant dietary indiscretion, admits to  ingestion of large amounts of soda and processed carbohydrates. Recent labs reviewed.   - I have re-counseled the patient on diet management  by adopting a carbohydrate restricted / protein rich  Diet.  - Suggestion is made for patient to avoid simple carbohydrates   from their diet including Cakes , Desserts, Ice Cream,  Soda (  diet and regular) , Sweet Tea , Candies,  Chips, Cookies, Artificial Sweeteners,   and "Sugar-free" Products .  This will help patient to have stable blood glucose profile and potentially avoid unintended  Weight gain.  - Patient is advised to stick to a routine mealtimes to eat 3 meals  a day and avoid unnecessary snacks ( to snack only to correct hypoglycemia).  - The patient  has been  scheduled with Jearld Fenton, RDN, CDE for individualized DM education.  - I have approached patient with the following individualized plan to manage diabetes and patient agrees.   -Mrs. Beine  would have used basal/bolus insulin however this was too overwhelming for her.  she lives alone. - She came with slightly better blood glucose profile.  She will be continued on premixed insulin Novolog 70/30, increase to 50 units with breakfast and 35 units with supper for pre-meal blood glucose above 90 mg/dL. I have taken away all of the insulin production protein to avoid inadvertent insulin overdose and hypoglycemia.  -Patient is encouraged to call clinic for blood glucose levels less than 70 or above 300 mg /dl.  -She will be taken off of Victoza.   - Patient specific target  for A1c; LDL, HDL, Triglycerides, and  Waist Circumference were discussed in detail.  2) BP/HTN: Uncontrolled, patient could not confirm if she is consistent taking her blood pressure medications. I advised her to resume her current medications including ACEI/ARB. 3) Lipids/HPL:  continue ezetimibe, continue Crestor 20 mg by mouth daily at bedtime.   4)  Weight/Diet: CDE consult in progress, exercise, and  carbohydrates information provided.  5) Chronic Care/Health Maintenance:  -Patient  on ACEI/ARB and Statin medications and encouraged to continue to follow up with Ophthalmology, Podiatrist at least  yearly or according to recommendations, and advised to  stay away from smoking. I have recommended yearly flu vaccine and pneumonia vaccination at least every 5 years; moderate intensity exercise for up to 150 minutes weekly; and  sleep for at least 7 hours a day. - She has significant peripheral neuropathy and peripheral arterial disease putting her at risk for diabetes foot ulcer. She is being evaluated by her podiatrist for custom shoes. She would benefit from this therapeutic measure. - 25 minutes of time was spent on the care of this patient , 50% of which was applied for counseling on diabetes complications and their preventions.  - I advised patient to maintain close follow up with [name], MD for primary care needs.  Patient is asked to bring meter and  blood glucose logs during their next visit.   Follow up plan: -Return in about 3 months (around 08/11/2016) for follow up with pre-visit labs, meter, and logs.  Glade Lloyd, MD Phone: [phone]  Fax: [phone]   05/11/2016, 11:23 AM

## 2016-05-15 ENCOUNTER — Other Ambulatory Visit (HOSPITAL_COMMUNITY): Payer: Self-pay | Admitting: Pulmonary Disease

## 2016-05-15 ENCOUNTER — Ambulatory Visit (HOSPITAL_COMMUNITY)
Admission: RE | Admit: 2016-05-15 | Discharge: 2016-05-15 | Disposition: A | Discharge status: Home / Self Care | Payer: Medicare Other | Source: Ambulatory Visit | Attending: Family Medicine | Admitting: Family Medicine

## 2016-05-15 DIAGNOSIS — N644 Mastodynia: Secondary | ICD-10-CM

## 2016-05-15 DIAGNOSIS — N6002 Solitary cyst of left breast: Secondary | ICD-10-CM | POA: Insufficient documentation

## 2016-05-15 DIAGNOSIS — N6012 Diffuse cystic mastopathy of left breast: Secondary | ICD-10-CM | POA: Diagnosis not present

## 2016-05-23 ENCOUNTER — Encounter: Payer: Self-pay | Admitting: Pharmacist

## 2016-05-23 ENCOUNTER — Other Ambulatory Visit: Payer: Self-pay | Admitting: Pharmacist

## 2016-05-23 ENCOUNTER — Ambulatory Visit: Payer: Self-pay | Admitting: Pharmacist

## 2016-05-23 NOTE — Patient Outreach (Signed)
[facility]) Care Management  [place]   05/23/2016  Krystal [name] October 12, 1936 [id number]  Subjective: Krystal [name] is a 80 yo who was referred to [place] for medication management.  I made follow up home visit today to review bubble packs with patient and follow up on medication adherence.  Patient has her medications bubble packed by [facility] in [place], and most recent bubble pack with thirty days worth of medication was delivered to her home today.    Patient reports adherence with medications.  She reports the alarm on her phone has assisted her with adherence and patient denies missing any doses of her medications since my last visit.  Patient reports taking Novolog 70/30 Mix 40 units with breakfast and 30 units with supper.  Patient reports she ran out of her Advair on Saturday.    Patient reports concern regarding medication cost.  She reports three of the medications in her bubble pack were very expensive.     Objective:   Encounter Medications: Outpatient Encounter Prescriptions as of 05/23/2016  Medication Sig Note  . aspirin 81 MG EC tablet Take 81 mg by mouth every morning.    . calcitRIOL (ROCALTROL) 0.25 MCG capsule Take 0.25 mcg by mouth daily. Reported on 03/30/2016   . clopidogrel (PLAVIX) 75 MG tablet Take 1 tablet (75 mg total) by mouth daily.   Marland Kitchen ezetimibe (ZETIA) 10 MG tablet Take 1 tablet (10 mg total) by mouth every morning.   . famotidine (PEPCID) 20 MG tablet Take 1 tablet (20 mg total) by mouth daily.   . febuxostat (ULORIC) 40 MG tablet Take 40 mg by mouth daily.   . ferrous sulfate 325 (65 FE) MG tablet Take 325 mg by mouth daily.    . Fluticasone-Salmeterol (ADVAIR DISKUS) 250-50 MCG/DOSE AEPB Inhale 1 puff into the lungs 2 (two) times daily.   Marland Kitchen glucose blood (ONETOUCH VERIO) test strip E11.65 Use 4 times daily as directed   . HYDROcodone-acetaminophen (NORCO/VICODIN) 5-325 MG tablet Take 1 tablet by mouth every  12 (twelve) hours as needed for moderate pain.   Marland Kitchen insulin aspart protamine - aspart (NOVOLOG 70/30 MIX) (70-30) 100 UNIT/ML FlexPen Inject 35-50 Units into the skin 2 (two) times daily before a meal. Inject 50 units just before  breakfast if blood glucose is above 90 and inject 35 units just before  supper if blood glucose is above 90. 05/23/2016: Currently taking 40 in the morning and 30 in the evening   . Insulin Pen Needle (B-D ULTRAFINE III SHORT PEN) 31G X 8 MM MISC 1 each by Does not apply route as directed.   . isosorbide mononitrate (IMDUR) 60 MG 24 hr tablet Take 1 tablet (60 mg total) by mouth daily.   Marland Kitchen lisinopril (PRINIVIL,ZESTRIL) 10 MG tablet TAKE (1) TABLET BY MOUTH ONCE DAILY.   . nebivolol (BYSTOLIC) 5 MG tablet Take 1 tablet (5 mg total) by mouth 2 (two) times daily.   . nitroGLYCERIN (NITROSTAT) 0.4 MG SL tablet Place 1 tablet (0.4 mg total) under the tongue every 5 (five) minutes as needed for chest pain. 04/30/2016: Has on hand if needed.   . polyethylene glycol (MIRALAX / GLYCOLAX) packet Take 17 g by mouth daily as needed for mild constipation. 04/30/2016: Takes if needed  . RANEXA 500 MG 12 hr tablet TAKE (1) TABLET TWICE DAILY.   . rosuvastatin (CRESTOR) 20 MG tablet Take 1 tablet (20 mg total) by mouth every evening.   Marland Kitchen  torsemide (DEMADEX) 20 MG tablet May take extra 20 mg daily for swelling    No facility-administered encounter medications on file as of 05/23/2016.   Functional Status: In your present state of health, do you have any difficulty performing the following activities: 04/23/2016 02/29/2016  Hearing? N N  Vision? Y Y  Difficulty concentrating or making decisions? N N  Walking or climbing stairs? Y Y  Dressing or bathing? N N  Doing errands, shopping? [name]  Preparing Food and eating ? N N  Using the Toilet? N N  In the past six months, have you accidently leaked urine? Y Y  Do you have problems with loss of bowel control? Y N  Managing your Medications? Y Y   Managing your Finances? Y N  Housekeeping or managing your Housekeeping? [name]   Fall/Depression Screening: PHQ 2/9 Scores 05/11/2016 04/13/2016 03/16/2016 02/23/2016 02/01/2016 01/20/2016 12/06/2015  PHQ - 2 Score 0 0 0 0 0 0 0    Assessment: 1.  Medication management:  Patient received bubble packs from [facility] today.  Bubble packs are filled through 06/23/16.  I reviewed all the medications in patient's bubble packs.  All medications were included in the bubble packs except Uloric which patient receives from the patient assistance program.  Patient confirms she has continued to take Uloric 1 tablet daily out of the bottle in addition to the medications in her bubble pack.  Patient confirms she received the refill of Uloric from the patient assistance program.  Patient confirms she really likes having her medications bubble packaged and feels this system is working really well for her.    2.  Medication adherence:  Patient reports adherence with all medications in her bubble pack.  Patient reports use of alarm on her phone has improved adherence significantly.  Patient is not currently taking Advair because she ran out on Saturday.  I counseled patient on purpose, proper use, and adverse effects of Advair and counseled her on the importance of taking Advair twice daily as prescribed.  Patient voiced understanding and called [facility] to request Advair refill during visit.  Advair is scheduled to be delivered tomorrow.  I also identified that patient is taking Novolog 70/30 Mix 40 units in the morning and 30 units in the evening.  Patient had endocrinology visit on 05/11/16 and Novolog 70/30 dose was increased to 50 units in the morning and 35 units in the evening.  Patient reviewed paperwork provided to her at the endocrinology visit and verified new dose.  Counseled patient on purpose, proper use, and adverse effects of insulin.  Patient reports current CBGs in the 200-300s.  Patient  denies hypoglycemic events and is able to verbalize appropriate hypoglycemia management plan.  Counseled patient on importance of taking medications as prescribed and adjusting insulin dose as instructed by her endocrinologist.  Patient voiced understanding.    3.  Cost of Bystolic, Ranexa, and Zetia:  Patient reports these three medications cost her ~$300 total this month.  Patient was unsure if the pharmacy ran these prescriptions under both of her insurance plans (patient has [employer] and federal part D).  I advised patient to call [place] to ask whether the medications were ran under both insurance plans.  Patient called [facility] who reports they will look into it and call her back.  Patient may be in the donut hole.     Plan: 1.  Patient to take all medications as prescribed  using weekly bubble packs filled by [facility].  Patient is aware she needs to continue to take Uloric out of the pill bottle in addition to the medications in her morning bubble pack.   2.  Patient to resume taking Advair as prescribed once she receives refill from [facility].   3.  Patient to take Novolog 70/30 Mix 50 units in the morning and 35 units in the evening as prescribed.   4.  Patient to continue to use alarm on her phone at 9 PM to remind her to take her evening medications.   5.  Patient called [facility] to inquire about medication cost of Bystolic, Ranexa, and Zetia to ensure medications were filled using both of her insurance plans.  Patient is awaiting return call from [facility].  Asked patient to call me once she hears from [facility].  If patient is in the donut hole, will assist her in completing patient assistance applications for these medications.  I will call patient on Friday, 05/25/16, if I have not heard from her.     THN CM Care Plan Problem Two        Most Recent Value   Care Plan Problem Two   Medication adherence   Role Documenting the Problem Two  Clinical Pharmacist   Care Plan for Problem Two  Active   THN CM Short Term Goal #1 (0-30 days)  Patient will take her medications as prescribed over the next thirty days.    THN CM Short Term Goal #1 Start Date  05/23/16 [renewed goal]   Interventions for Short Term Goal #2   Patient reports taking all the medications in her bubble pack every day as prescribed.  Patient is not currently adherent with Advair due to running out.  Patient ordered Advair refill during visit.  Counseled patient on purpose, proper use, and adverse effects of Advair and counseled patient on the importance of taking as prescribed.  Patient voiced understanding.        [name], Pharm.D. Pharmacy Resident [place] [phone]

## 2016-05-23 NOTE — Patient Outreach (Signed)
[facility]) Care Management  05/23/2016  Krystal [name] 02-Mar-1936 [id number]   [name] is a 80 yo who I am following for medication management.  I called [name] to confirm home visit scheduled for today at 10:30 AM.  Patient reports [name] is delivering her new bubble pack today and 2 PM and requested I come for home visit this afternoon in order to review contents of bubble pack with her.  Will reschedule home visit for today at 4:00 PM.     [name], Pharm.D. Pharmacy Resident [place] [phone]

## 2016-05-25 ENCOUNTER — Ambulatory Visit: Payer: Self-pay | Admitting: *Deleted

## 2016-05-25 ENCOUNTER — Other Ambulatory Visit: Payer: Self-pay | Admitting: Pharmacist

## 2016-05-25 NOTE — Patient Outreach (Signed)
[facility]) Care Management  [place]   05/25/2016  LATOYAH MCPETERS Dec 13, 1935 [id number]  Subjective: Krystal [name] is a 80 yo who was referred to [place] for medication management.  I completed home visit on 05/23/16 and patient reported concern about cost of several of her medications (Bystolic, Zetia, and Ranexa).  Phone call was made to patient's pharmacy to determine if prescriptions were filled using both of her insurance plans ([employer] part D plan and [place]).  Patient was informed that the pharmacy would look into it and call her back.    I call patient today to follow up.  Patient reports she has not heard anything from [facility] yet.  She did confirm that her Advair prescription was delivered yesterday.  Patient was unsure how much the Advair inhaler cost.    With patient's permission, I called [facility] and spoke to [place].  I was informed that patient's medications were filled using both of her insurance plans.  This indicates that patient is likely in the donut hole.    Objective:   Encounter Medications: Outpatient Encounter Prescriptions as of 05/25/2016  Medication Sig Note  . aspirin 81 MG EC tablet Take 81 mg by mouth every morning.    . calcitRIOL (ROCALTROL) 0.25 MCG capsule Take 0.25 mcg by mouth daily. Reported on 03/30/2016   . clopidogrel (PLAVIX) 75 MG tablet Take 1 tablet (75 mg total) by mouth daily.   Marland Kitchen ezetimibe (ZETIA) 10 MG tablet Take 1 tablet (10 mg total) by mouth every morning.   . famotidine (PEPCID) 20 MG tablet Take 1 tablet (20 mg total) by mouth daily.   . febuxostat (ULORIC) 40 MG tablet Take 40 mg by mouth daily.   . ferrous sulfate 325 (65 FE) MG tablet Take 325 mg by mouth daily.    . Fluticasone-Salmeterol (ADVAIR DISKUS) 250-50 MCG/DOSE AEPB Inhale 1 puff into the lungs 2 (two) times daily.   Marland Kitchen glucose blood (ONETOUCH VERIO) test strip E11.65 Use 4 times daily  as directed   . HYDROcodone-acetaminophen (NORCO/VICODIN) 5-325 MG tablet Take 1 tablet by mouth every 12 (twelve) hours as needed for moderate pain.   Marland Kitchen insulin aspart protamine - aspart (NOVOLOG 70/30 MIX) (70-30) 100 UNIT/ML FlexPen Inject 35-50 Units into the skin 2 (two) times daily before a meal. Inject 50 units just before  breakfast if blood glucose is above 90 and inject 35 units just before  supper if blood glucose is above 90. 05/23/2016: Currently taking 40 in the morning and 30 in the evening   . Insulin Pen Needle (B-D ULTRAFINE III SHORT PEN) 31G X 8 MM MISC 1 each by Does not apply route as directed.   . isosorbide mononitrate (IMDUR) 60 MG 24 hr tablet Take 1 tablet (60 mg total) by mouth daily.   Marland Kitchen lisinopril (PRINIVIL,ZESTRIL) 10 MG tablet TAKE (1) TABLET BY MOUTH ONCE DAILY.   . nebivolol (BYSTOLIC) 5 MG tablet Take 1 tablet (5 mg total) by mouth 2 (two) times daily.   . nitroGLYCERIN (NITROSTAT) 0.4 MG SL tablet Place 1 tablet (0.4 mg total) under the tongue every 5 (five) minutes as needed for chest pain. 04/30/2016: Has on hand if needed.   . polyethylene glycol (MIRALAX / GLYCOLAX) packet Take 17 g by mouth daily as needed for mild constipation. 04/30/2016: Takes if needed  . RANEXA 500 MG 12 hr tablet TAKE (1) TABLET TWICE DAILY.   . rosuvastatin (  CRESTOR) 20 MG tablet Take 1 tablet (20 mg total) by mouth every evening.   . torsemide (DEMADEX) 20 MG tablet May take extra 20 mg daily for swelling    No facility-administered encounter medications on file as of 05/25/2016.    Functional Status: In your present state of health, do you have any difficulty performing the following activities: 04/23/2016 02/29/2016  Hearing? N N  Vision? Y Y  Difficulty concentrating or making decisions? N N  Walking or climbing stairs? Y Y  Dressing or bathing? N N  Doing errands, shopping? [name]  Preparing Food and eating ? N N  Using the Toilet? N N  In the past six months, have you accidently  leaked urine? Y Y  Do you have problems with loss of bowel control? Y N  Managing your Medications? Y Y  Managing your Finances? Y N  Housekeeping or managing your Housekeeping? [name]    Fall/Depression Screening: PHQ 2/9 Scores 05/11/2016 04/13/2016 03/16/2016 02/23/2016 02/01/2016 01/20/2016 12/06/2015  PHQ - 2 Score 0 0 0 0 0 0 0    Assessment/Plan: 1.  Medication assistance:  Patient is in the donut hole resulting in increased copay of Ranexa, Bystolic, and Zetia.  Paitent's Advair and Novolog 70/30 Mix will likely have increased copay as well.  Patient may qualify for the Patient Assistance Network (PAN) Foundation for [employer], and the patient assistance programs for Ranexa, Zetia, Advair, and Novolog.    I assisted patient in completing [place] application for Bystolic today; however, received an error message when application was being processed.  Will try to submit application again at a later time.    To qualify for patient assistance programs, patient must apply for and been denied Extra Help and must obtain her out of pocket expense report for the current calendar year.  Completed Extra Help application with patient on the phone today.  Based on patient's income, I do not believe patient will qualify for Extra Help.  Patient will get a letter in the mail in 2 to 4 weeks stating whether she was approved or denied Extra Help.  Advised patient to save letter from social security when she receives it.  Also advised patient to call [place] to request an out of pocket expense report for the current calendar year.    THN CM Pharmacist, Deanne Coffer, will schedule a home visit on 05/29/16 to assist patient in completing patient assistance applications for Ranexa, Zetia, Novolog 70/30 Mix, and Advair, and will attempt to submit [place] application for Bystolic again.  I will fax provider portion of applications to patient's providers today.     [name],  Pharm.D. Pharmacy Resident [place] [phone]

## 2016-05-29 ENCOUNTER — Other Ambulatory Visit: Payer: Self-pay | Admitting: *Deleted

## 2016-05-29 ENCOUNTER — Other Ambulatory Visit: Payer: Self-pay

## 2016-05-29 ENCOUNTER — Encounter: Payer: Self-pay | Admitting: *Deleted

## 2016-05-29 NOTE — Patient Outreach (Signed)
[name]) Care Management   05/29/2016  Krystal [name] 26-Aug-1936 [id number]  Krystal [name] is an 80 y.o. female  Subjective:  "My sugar was 190 this morning" "I thought 70-190 was normal for blood sugar"  Patient reports she saw Dr.Nida regarding her diabetes he has increased her insulin and she is worried that it is too much. She states she gets shaky when her blood sugar is below 200.  Patient has her medications in bubble packs provided by her pharmacy. She states this is very helpful except for the cost. She states that she had been getting samples of several medications and some through pharmacy assistance. Also, states she his the "donut hole". She reports that [facility] pharmacist will be working with her to look into more pharmacy assistance through the pharmaceutical companies. She verbalizes that [facility] pharmacist was there earlier today.  Objective:   BP 134/50 mmHg  Pulse 61  Resp 20  Wt 197 lb (89.359 kg)  SpO2 100% Review of Systems  Constitutional: Negative.   Eyes: Negative.   Cardiovascular: Positive for leg swelling.       Slight pedal edema  Gastrointestinal: Negative.   Musculoskeletal: Positive for joint pain.  Skin: Negative.   Neurological: Negative.   Endo/Heme/Allergies: Negative.     Physical Exam  Constitutional: She is oriented to person, place, and time. She appears well-developed.  Cardiovascular: Normal rate.   Respiratory: Effort normal and breath sounds normal.  GI: Soft. Bowel sounds are normal.  Musculoskeletal: Normal range of motion.  Neurological: She is alert and oriented to person, place, and time.  Skin: Skin is warm and dry.    Encounter Medications:   Outpatient Encounter Prescriptions as of 05/29/2016  Medication Sig Note  . aspirin 81 MG EC tablet Take 81 mg by mouth every morning.    . calcitRIOL (ROCALTROL) 0.25 MCG capsule Take 0.25 mcg by mouth daily. Reported on 03/30/2016   . clopidogrel (PLAVIX) 75  MG tablet Take 1 tablet (75 mg total) by mouth daily.   Marland Kitchen ezetimibe (ZETIA) 10 MG tablet Take 1 tablet (10 mg total) by mouth every morning.   . famotidine (PEPCID) 20 MG tablet Take 1 tablet (20 mg total) by mouth daily.   . febuxostat (ULORIC) 40 MG tablet Take 40 mg by mouth daily.   . ferrous sulfate 325 (65 FE) MG tablet Take 325 mg by mouth daily.    . Fluticasone-Salmeterol (ADVAIR DISKUS) 250-50 MCG/DOSE AEPB Inhale 1 puff into the lungs 2 (two) times daily.   Marland Kitchen glucose blood (ONETOUCH VERIO) test strip E11.65 Use 4 times daily as directed   . HYDROcodone-acetaminophen (NORCO/VICODIN) 5-325 MG tablet Take 1 tablet by mouth every 12 (twelve) hours as needed for moderate pain.   Marland Kitchen insulin aspart protamine - aspart (NOVOLOG 70/30 MIX) (70-30) 100 UNIT/ML FlexPen Inject 35-50 Units into the skin 2 (two) times daily before a meal. Inject 50 units just before  breakfast if blood glucose is above 90 and inject 35 units just before  supper if blood glucose is above 90. 05/29/2016: 50u am and 34u qhs  . Insulin Pen Needle (B-D ULTRAFINE III SHORT PEN) 31G X 8 MM MISC 1 each by Does not apply route as directed.   . isosorbide mononitrate (IMDUR) 60 MG 24 hr tablet Take 1 tablet (60 mg total) by mouth daily.   Marland Kitchen lisinopril (PRINIVIL,ZESTRIL) 10 MG tablet TAKE (1) TABLET BY MOUTH ONCE DAILY.   . nebivolol (BYSTOLIC) 5 MG  tablet Take 1 tablet (5 mg total) by mouth 2 (two) times daily.   . nitroGLYCERIN (NITROSTAT) 0.4 MG SL tablet Place 1 tablet (0.4 mg total) under the tongue every 5 (five) minutes as needed for chest pain. 04/30/2016: Has on hand if needed.   . polyethylene glycol (MIRALAX / GLYCOLAX) packet Take 17 g by mouth daily as needed for mild constipation. 04/30/2016: Takes if needed  . RANEXA 500 MG 12 hr tablet TAKE (1) TABLET TWICE DAILY.   . rosuvastatin (CRESTOR) 20 MG tablet Take 1 tablet (20 mg total) by mouth every evening.   . torsemide (DEMADEX) 20 MG tablet May take extra 20 mg daily  for swelling    No facility-administered encounter medications on file as of 05/29/2016.       Assessment:   Diabetes: Reviewed patient blood sugars, encouraged patient around "normal blood sugar readings" Patient having low blood sugar symptoms with reading below 200. RNCM reviewed treatment of low blood sugar with protein snack along with a CHO. Instructed patient around hypo and hyper glycemia symptoms.  HF: patient weight 197#, some slight edema, reviewed self management of HF, patient aware of cardiologist instructions to take extra Torsemide for weight gain of 2# or more overnight RNCM located bottle of torsemide and set out on the table so patient can take extra if needed.  She now has her medications in bubble packs but does have a few extra pills in a bottle for prn dosing.  Plan:  Ongoing [facility] program services for diabetes disease management Will visit again next month, patient will call with any new issues or concerns. Patient agrees to call MD or 911 for any emergent needs.  [name]. [name], RN, BSN, [place] [phone]

## 2016-05-30 NOTE — Patient Outreach (Signed)
[facility]) Care Management  [place]   05/30/2016  AHLIANA [name] Mar 19, 1936 [id number]  Subjective: Krystal [name] is a 80 yo who was referred to [facility] for medication management. [name] reported concern about cost of several of her medications (Bystolic, Zetia, and Ranexa). It was determined that [name] had reached the donut hole and now her Bystolic, Zetia, Ranexa, Advair and Novolog 70/30 would be over $100 each every month.  She cannot afford this.  I am here today to complete the patient assistance forms for each of the medications and to get the required documents and her signature.  Objective:   Encounter Medications: Outpatient Encounter Prescriptions as of 05/29/2016  Medication Sig Note  . aspirin 81 MG EC tablet Take 81 mg by mouth every morning.    . calcitRIOL (ROCALTROL) 0.25 MCG capsule Take 0.25 mcg by mouth daily. Reported on 03/30/2016   . clopidogrel (PLAVIX) 75 MG tablet Take 1 tablet (75 mg total) by mouth daily.   Marland Kitchen ezetimibe (ZETIA) 10 MG tablet Take 1 tablet (10 mg total) by mouth every morning.   . famotidine (PEPCID) 20 MG tablet Take 1 tablet (20 mg total) by mouth daily.   . febuxostat (ULORIC) 40 MG tablet Take 40 mg by mouth daily.   . ferrous sulfate 325 (65 FE) MG tablet Take 325 mg by mouth daily.    . Fluticasone-Salmeterol (ADVAIR DISKUS) 250-50 MCG/DOSE AEPB Inhale 1 puff into the lungs 2 (two) times daily.   Marland Kitchen glucose blood (ONETOUCH VERIO) test strip E11.65 Use 4 times daily as directed   . HYDROcodone-acetaminophen (NORCO/VICODIN) 5-325 MG tablet Take 1 tablet by mouth every 12 (twelve) hours as needed for moderate pain.   Marland Kitchen insulin aspart protamine - aspart (NOVOLOG 70/30 MIX) (70-30) 100 UNIT/ML FlexPen Inject 35-50 Units into the skin 2 (two) times daily before a meal. Inject 50 units just before  breakfast if blood glucose is above 90 and inject 35 units just before  supper if blood glucose is above  90. 05/29/2016: 50u am and 34u qhs  . Insulin Pen Needle (B-D ULTRAFINE III SHORT PEN) 31G X 8 MM MISC 1 each by Does not apply route as directed.   . isosorbide mononitrate (IMDUR) 60 MG 24 hr tablet Take 1 tablet (60 mg total) by mouth daily.   Marland Kitchen lisinopril (PRINIVIL,ZESTRIL) 10 MG tablet TAKE (1) TABLET BY MOUTH ONCE DAILY.   . nebivolol (BYSTOLIC) 5 MG tablet Take 1 tablet (5 mg total) by mouth 2 (two) times daily.   . nitroGLYCERIN (NITROSTAT) 0.4 MG SL tablet Place 1 tablet (0.4 mg total) under the tongue every 5 (five) minutes as needed for chest pain. 04/30/2016: Has on hand if needed.   . polyethylene glycol (MIRALAX / GLYCOLAX) packet Take 17 g by mouth daily as needed for mild constipation. 04/30/2016: Takes if needed  . RANEXA 500 MG 12 hr tablet TAKE (1) TABLET TWICE DAILY.   . rosuvastatin (CRESTOR) 20 MG tablet Take 1 tablet (20 mg total) by mouth every evening.   . torsemide (DEMADEX) 20 MG tablet May take extra 20 mg daily for swelling    No facility-administered encounter medications on file as of 05/29/2016.    Functional Status: In your present state of health, do you have any difficulty performing the following activities: 04/23/2016 02/29/2016  Hearing? N N  Vision? Y Y  Difficulty concentrating or making decisions? N N  Walking or climbing stairs? [name]  Dressing or bathing? N N  Doing errands, shopping? [name]  Preparing Food and eating ? N N  Using the Toilet? N N  In the past six months, have you accidently leaked urine? Y Y  Do you have problems with loss of bowel control? Y N  Managing your Medications? Y Y  Managing your Finances? Y N  Housekeeping or managing your Housekeeping? [name]    Fall/Depression Screening: PHQ 2/9 Scores 05/11/2016 04/13/2016 03/16/2016 02/23/2016 02/01/2016 01/20/2016 12/06/2015  PHQ - 2 Score 0 0 0 0 0 0 0    Assessment: 1.  Medications:  Ms. Loughnane has all of her medications currently for the month.    Plan: 1.  We completed the paperwork for  Zetia, Novolog 70/30, Ranexa, and Advair.  Ms. Hubby signed all of the documents and provided all the necessary paperwork to send to the companies.  2.  I applied for Bystolic through the [employer] foundation and Ms. Kyllonen was approved.  She has $800 to pay for her Bystolic co-pays until [id number].  She will be receiving a card in the mail to give to [facility].   3.  I will get the signatures and prescriptions from the providers to send with the applications for the medications.  4.  I will follow up with Ms. Salser in the next 2 to 3 weeks to see if she was denied or approved for Extra Help.    Deanne Coffer, PharmD, [place] [phone]

## 2016-06-12 ENCOUNTER — Other Ambulatory Visit: Payer: Self-pay

## 2016-06-12 NOTE — Patient Outreach (Signed)
I called Ms. Hoop to discuss if she has heard back from the patient assistance foundations.  She verified her PHI.  She stated she was denied from [employer] because she had not spent $1000.  I stated that the documents we sent in showed that she had.  I tried to get the documents from [facility] but was not able to download them from the site.  I will try again at a later time.   I told her that I would call and find out what the next steps were.  I called [employer] and I could not talk to them since I was not on the form as a representative.  I will try to do a three way call with Ms. Prusak at a later time to get permission to speak with [employer].    Deanne Coffer, PharmD, [place] [phone]

## 2016-06-13 ENCOUNTER — Other Ambulatory Visit: Payer: Self-pay | Admitting: Cardiovascular Disease

## 2016-06-13 ENCOUNTER — Other Ambulatory Visit: Payer: Self-pay | Admitting: "Endocrinology

## 2016-06-29 ENCOUNTER — Other Ambulatory Visit: Payer: Self-pay | Admitting: *Deleted

## 2016-06-29 ENCOUNTER — Encounter: Payer: Self-pay | Admitting: *Deleted

## 2016-06-29 NOTE — Patient Outreach (Signed)
[facility]) Care Management   06/29/2016  Krystal [name] 1935-12-23 [id number]  Krystal [name] is an 80 y.o. female  Subjective:  Patient reports she is keeping better records of her blood sugars. Patient still hopes to move to new apartment. Patient still has assistance from private pay aide, who is helping her with her diabetes.  Patient reports new issue with lower abd pain and pressure, she will see MD if does not improve.  Objective:   BP 136/62   Pulse 62   Resp 20   Wt 197 lb (89.4 kg)   SpO2 98%   BMI 29.95 kg/m  Review of Systems  Constitutional: Negative.   HENT: Negative.   Eyes: Negative.   Respiratory: Negative.   Cardiovascular: Negative.   Gastrointestinal: Positive for abdominal pain and constipation.  Skin: Negative.   Neurological: Negative.   Endo/Heme/Allergies: Negative.   Psychiatric/Behavioral: Negative.     Physical Exam  Constitutional: She is oriented to person, place, and time. She appears well-developed.  Cardiovascular: Normal rate.   GI: Soft. Bowel sounds are normal.  Genitourinary: Vagina normal.  Neurological: She is alert and oriented to person, place, and time.  Skin: Skin is warm.    Encounter Medications:   Outpatient Encounter Prescriptions as of 06/29/2016  Medication Sig Note  . aspirin 81 MG EC tablet Take 81 mg by mouth every morning.    . calcitRIOL (ROCALTROL) 0.25 MCG capsule Take 0.25 mcg by mouth daily. Reported on 03/30/2016   . clopidogrel (PLAVIX) 75 MG tablet Take 1 tablet (75 mg total) by mouth daily.   Marland Kitchen ezetimibe (ZETIA) 10 MG tablet Take 1 tablet (10 mg total) by mouth every morning.   . famotidine (PEPCID) 20 MG tablet Take 1 tablet (20 mg total) by mouth daily.   . febuxostat (ULORIC) 40 MG tablet Take 40 mg by mouth daily.   . ferrous sulfate 325 (65 FE) MG tablet Take 325 mg by mouth daily.    . Fluticasone-Salmeterol (ADVAIR DISKUS) 250-50 MCG/DOSE AEPB Inhale 1 puff into the lungs 2  (two) times daily.   Marland Kitchen HYDROcodone-acetaminophen (NORCO/VICODIN) 5-325 MG tablet Take 1 tablet by mouth every 12 (twelve) hours as needed for moderate pain.   Marland Kitchen insulin aspart protamine - aspart (NOVOLOG 70/30 MIX) (70-30) 100 UNIT/ML FlexPen Inject 35-50 Units into the skin 2 (two) times daily before a meal. Inject 50 units just before  breakfast if blood glucose is above 90 and inject 35 units just before  supper if blood glucose is above 90. 05/29/2016: 50u am and 34u qhs  . Insulin Pen Needle (B-D ULTRAFINE III SHORT PEN) 31G X 8 MM MISC 1 each by Does not apply route as directed.   . isosorbide mononitrate (IMDUR) 60 MG 24 hr tablet Take 1 tablet (60 mg total) by mouth daily.   Marland Kitchen lisinopril (PRINIVIL,ZESTRIL) 10 MG tablet TAKE 1 TABLET ONCE DAILY.   . nebivolol (BYSTOLIC) 5 MG tablet Take 1 tablet (5 mg total) by mouth 2 (two) times daily.   . nitroGLYCERIN (NITROSTAT) 0.4 MG SL tablet Place 1 tablet (0.4 mg total) under the tongue every 5 (five) minutes as needed for chest pain. 04/30/2016: Has on hand if needed.   Glory Rosebush VERIO test strip USE FOUR TIMES DAILY AS DIRECTED.   Marland Kitchen polyethylene glycol (MIRALAX / GLYCOLAX) packet Take 17 g by mouth daily as needed for mild constipation. 04/30/2016: Takes if needed  . RANEXA 500 MG 12 hr tablet TAKE (  1) TABLET TWICE DAILY.   . rosuvastatin (CRESTOR) 20 MG tablet Take 1 tablet (20 mg total) by mouth every evening.   . torsemide (DEMADEX) 20 MG tablet MAY TAKE EXTRA 20MG  DAILY FOR SWELLING.    No facility-administered encounter medications on file as of 06/29/2016.      Assessment:   Diabetes: praised patient for keeping better records of blood sugars and for getting overall averages down to below 200. HF: reviewed weights-stable, no new symptoms or issues this visit.  Plan:  Patient will follow up with MD about abd pain and pressure that is new. Continue [facility] program services. Will visit again in September.   Krystal [name]. Krystal Purser, RN, BSN, [place] [phone]

## 2016-07-04 ENCOUNTER — Other Ambulatory Visit: Payer: Self-pay | Admitting: "Endocrinology

## 2016-07-04 DIAGNOSIS — N183 Chronic kidney disease, stage 3 (moderate): Secondary | ICD-10-CM | POA: Diagnosis not present

## 2016-07-04 DIAGNOSIS — E1122 Type 2 diabetes mellitus with diabetic chronic kidney disease: Secondary | ICD-10-CM | POA: Diagnosis not present

## 2016-07-04 DIAGNOSIS — Z794 Long term (current) use of insulin: Secondary | ICD-10-CM | POA: Diagnosis not present

## 2016-07-04 LAB — COMPLETE METABOLIC PANEL WITH GFR
ALT: 19 U/L (ref 6–29)
AST: 18 U/L (ref 10–35)
Albumin: 4.1 g/dL (ref 3.6–5.1)
Alkaline Phosphatase: 57 U/L (ref 33–130)
BILIRUBIN TOTAL: 0.3 mg/dL (ref 0.2–1.2)
BUN: 44 mg/dL — AB (ref 7–25)
CALCIUM: 9.4 mg/dL (ref 8.6–10.4)
CO2: 30 mmol/L (ref 20–31)
CREATININE: 2.49 mg/dL — AB (ref 0.60–0.93)
Chloride: 101 mmol/L (ref 98–110)
GFR, EST AFRICAN AMERICAN: 21 mL/min — AB (ref >=60)
GFR, Est Non African American: 18 mL/min — ABNORMAL LOW (ref >=60)
Glucose, Bld: 204 mg/dL — ABNORMAL HIGH (ref 65–99)
Potassium: 4 mmol/L (ref 3.5–5.3)
Sodium: 142 mmol/L (ref 135–146)
TOTAL PROTEIN: 6.9 g/dL (ref 6.1–8.1)

## 2016-07-05 LAB — HEMOGLOBIN A1C
Hgb A1c MFr Bld: 11.3 % — ABNORMAL HIGH (ref <5.7)
MEAN PLASMA GLUCOSE: 278 mg/dL

## 2016-07-17 ENCOUNTER — Other Ambulatory Visit: Payer: Self-pay | Admitting: Cardiovascular Disease

## 2016-07-17 DIAGNOSIS — E1122 Type 2 diabetes mellitus with diabetic chronic kidney disease: Secondary | ICD-10-CM | POA: Diagnosis not present

## 2016-07-17 DIAGNOSIS — N183 Chronic kidney disease, stage 3 (moderate): Secondary | ICD-10-CM | POA: Diagnosis not present

## 2016-07-17 DIAGNOSIS — M542 Cervicalgia: Secondary | ICD-10-CM | POA: Diagnosis not present

## 2016-07-19 DIAGNOSIS — R809 Proteinuria, unspecified: Secondary | ICD-10-CM | POA: Diagnosis not present

## 2016-07-19 DIAGNOSIS — Z79899 Other long term (current) drug therapy: Secondary | ICD-10-CM | POA: Diagnosis not present

## 2016-07-19 DIAGNOSIS — N183 Chronic kidney disease, stage 3 (moderate): Secondary | ICD-10-CM | POA: Diagnosis not present

## 2016-07-19 DIAGNOSIS — E559 Vitamin D deficiency, unspecified: Secondary | ICD-10-CM | POA: Diagnosis not present

## 2016-07-19 DIAGNOSIS — D509 Iron deficiency anemia, unspecified: Secondary | ICD-10-CM | POA: Diagnosis not present

## 2016-07-19 DIAGNOSIS — I10 Essential (primary) hypertension: Secondary | ICD-10-CM | POA: Diagnosis not present

## 2016-07-20 ENCOUNTER — Encounter: Payer: Self-pay | Admitting: *Deleted

## 2016-07-20 ENCOUNTER — Other Ambulatory Visit: Payer: Self-pay | Admitting: *Deleted

## 2016-07-20 NOTE — Patient Outreach (Signed)
[facility] [facility]) Care Management   07/20/2016  REAGANN BASGALL 1936-02-10 [id number]  SARAHMARIE BATCHO is an 80 y.o. female  Subjective:  Patient states she is doing pretty good. She did see MD, he gave new medications for neck/back , states it is working ok, hoping it will get better.  Patient states she is eating better, her caregiver is helping patient with diabetic diet and better choices.  Patient is hoping to move to new apartment, states her caregiver will assist her with packing.    Objective:   Review of Systems  Constitutional: Negative.   HENT: Negative.   Eyes: Negative.   Respiratory: Negative.   Cardiovascular: Negative.   Gastrointestinal: Negative.   Genitourinary: Negative.   Musculoskeletal: Negative.   Skin: Negative.   Neurological: Negative.   Endo/Heme/Allergies: Negative.   Psychiatric/Behavioral: Negative.    BP 136/72   Pulse 62   Resp 20   Wt 192 lb (87.1 kg)   SpO2 97%   BMI 29.19 kg/m  Physical Exam  Constitutional: She is oriented to person, place, and time. She appears well-developed and well-nourished.  Cardiovascular: Normal rate.   Respiratory: Effort normal and breath sounds normal.  GI: Soft. Bowel sounds are normal.  Musculoskeletal: Normal range of motion.  Neurological: She is alert and oriented to person, place, and time.  Skin: Skin is warm and dry.  Psychiatric: She has a normal mood and affect.    Encounter Medications:   Outpatient Encounter Prescriptions as of 07/20/2016  Medication Sig Note  . aspirin 81 MG EC tablet Take 81 mg by mouth every morning.    . calcitRIOL (ROCALTROL) 0.25 MCG capsule Take 0.25 mcg by mouth daily. Reported on 03/30/2016   . clopidogrel (PLAVIX) 75 MG tablet TAKE 1 TABLET ONCE DAILY.   . cyclobenzaprine (FLEXERIL) 5 MG tablet Take 5 mg by mouth at bedtime.   Marland Kitchen ezetimibe (ZETIA) 10 MG tablet Take 1 tablet (10 mg total) by mouth every morning.   . famotidine (PEPCID) 20 MG tablet  Take 1 tablet (20 mg total) by mouth daily.   . febuxostat (ULORIC) 40 MG tablet Take 40 mg by mouth daily.   . ferrous sulfate 325 (65 FE) MG tablet Take 325 mg by mouth daily.    . Fluticasone-Salmeterol (ADVAIR DISKUS) 250-50 MCG/DOSE AEPB Inhale 1 puff into the lungs 2 (two) times daily.   Marland Kitchen HYDROcodone-acetaminophen (NORCO/VICODIN) 5-325 MG tablet Take 1 tablet by mouth every 12 (twelve) hours as needed for moderate pain.   Marland Kitchen insulin aspart protamine - aspart (NOVOLOG 70/30 MIX) (70-30) 100 UNIT/ML FlexPen Inject 35-50 Units into the skin 2 (two) times daily before a meal. Inject 50 units just before  breakfast if blood glucose is above 90 and inject 35 units just before  supper if blood glucose is above 90. 05/29/2016: 50u am and 34u qhs  . Insulin Pen Needle (B-D ULTRAFINE III SHORT PEN) 31G X 8 MM MISC 1 each by Does not apply route as directed.   . isosorbide mononitrate (IMDUR) 60 MG 24 hr tablet TAKE 1 TABLET ONCE DAILY.   Marland Kitchen lisinopril (PRINIVIL,ZESTRIL) 10 MG tablet TAKE 1 TABLET ONCE DAILY.   . nebivolol (BYSTOLIC) 5 MG tablet Take 1 tablet (5 mg total) by mouth 2 (two) times daily.   . nitroGLYCERIN (NITROSTAT) 0.4 MG SL tablet Place 1 tablet (0.4 mg total) under the tongue every 5 (five) minutes as needed for chest pain. 04/30/2016: Has on hand if needed.   Marland Kitchen  ONETOUCH VERIO test strip USE FOUR TIMES DAILY AS DIRECTED.   Marland Kitchen polyethylene glycol (MIRALAX / GLYCOLAX) packet Take 17 g by mouth daily as needed for mild constipation. 04/30/2016: Takes if needed  . RANEXA 500 MG 12 hr tablet TAKE (1) TABLET TWICE DAILY.   . rosuvastatin (CRESTOR) 20 MG tablet Take 1 tablet (20 mg total) by mouth every evening.   . torsemide (DEMADEX) 20 MG tablet MAY TAKE EXTRA 20MG  DAILY FOR SWELLING.    No facility-administered encounter medications on file as of 07/20/2016.     Functional Status:   In your present state of health, do you have any difficulty performing the following activities: 04/23/2016  02/29/2016  Hearing? N N  Vision? Y Y  Difficulty concentrating or making decisions? N N  Walking or climbing stairs? Y Y  Dressing or bathing? N N  Doing errands, shopping? [name]  Preparing Food and eating ? N N  Using the Toilet? N N  In the past six months, have you accidently leaked urine? Y Y  Do you have problems with loss of bowel control? Y N  Managing your Medications? Y Y  Managing your Finances? Y N  Housekeeping or managing your Housekeeping? [name]  Some recent data might be hidden    Fall/Depression Screening:    PHQ 2/9 Scores 05/11/2016 04/13/2016 03/16/2016 02/23/2016 02/01/2016 01/20/2016 12/06/2015  PHQ - 2 Score 0 0 0 0 0 0 0    Assessment:   Diabetes:reviewd cbgs, praised patient for doing better HF: instructed to continue daily weights and monitor for issues Medication: reviewed new medication. Educated on using stool softener to prevent constipation with pain medications  Plan:  [facility] CM Care Plan Problem One   Flowsheet Row Most Recent Value  Care Plan Problem One  Knowledge deficit related to diabetes as evidenced by patient questions around diabetes  Role Documenting the Problem One  Care Management [place] for Problem One  Active  [facility] Long Term Goal (31-90 days)  Patient will verbalize understanding of diabetes management over the next 60 days  Interventions for Problem One Long Term Goal  reviewed cbg log and praised for better readings and more diet adherence     Stanton Kidney E. [name], RN, BSN, [place] [phone]

## 2016-07-25 DIAGNOSIS — N184 Chronic kidney disease, stage 4 (severe): Secondary | ICD-10-CM | POA: Diagnosis not present

## 2016-07-25 DIAGNOSIS — D638 Anemia in other chronic diseases classified elsewhere: Secondary | ICD-10-CM | POA: Diagnosis not present

## 2016-07-25 DIAGNOSIS — R809 Proteinuria, unspecified: Secondary | ICD-10-CM | POA: Diagnosis not present

## 2016-07-25 DIAGNOSIS — I10 Essential (primary) hypertension: Secondary | ICD-10-CM | POA: Diagnosis not present

## 2016-07-25 DIAGNOSIS — E559 Vitamin D deficiency, unspecified: Secondary | ICD-10-CM | POA: Diagnosis not present

## 2016-07-25 DIAGNOSIS — N2581 Secondary hyperparathyroidism of renal origin: Secondary | ICD-10-CM | POA: Diagnosis not present

## 2016-08-09 ENCOUNTER — Other Ambulatory Visit: Payer: Self-pay | Admitting: Cardiovascular Disease

## 2016-08-16 ENCOUNTER — Other Ambulatory Visit: Payer: Self-pay | Admitting: Pharmacist

## 2016-08-16 NOTE — Patient Outreach (Signed)
[place] [facility]) Care Management  08/16/2016  Krystal [name] August 13, 1936 [id number]  Patient was initially referred to [place] for medication management and during home visit 05/2016 with [place], patient expressed cost concerns with some medications.  Successful outreach to patient on 08/16/16 at 1058, but she requested a call back later in the day at 1500.   Successful outreach to patient on 08/16/16 at 1504 and she verified HIPAA details.  Explained that [facility] Pharmacist was assisting [place] manager in following up with patient on her patient assistance applications for Novolog 70/30 [employer]), Ranexa (Ranexa Connects), and Advair ([place]).    Discussed with patient that most likely the patient assistance programs would require patient consent to discuss the application status with [facility] Pharmacist, so conference call was placed with patient that allowed for manufacturer patient assistance programs be contacted, at her request.     Novo Nordisk---representative reports patient was denied due to not meeting $1,000 out-of-pocket spend requirement and not submitting current year proof of income---representative reports patient could re-submit documentation and have application re-evaluated.   [place] Patient Assistance---representative reported that patient's application for Advair Diskus was  approved in 05/2016, and patient was due for a refill.  Patient requested the refill from [place] Patient assistance during call and was told by representative she should receive it in the next 7-10 business days.   Ranexa Connects---Ranexa patient assistance was closed at time of phone call so was unable to follow-up on this application with patient.   Patient was appreciative of assistance in helping her follow-up applications.  She reports she will attempt to find current proof of income and look for her updated Explanation of Benefits from her Part D  plan.  Plan:  Will place follow-up call to patient in the next 2 weeks.    Karrie Meres, PharmD, [place] [phone]

## 2016-08-17 ENCOUNTER — Ambulatory Visit: Payer: Medicare Other | Admitting: "Endocrinology

## 2016-08-20 ENCOUNTER — Encounter: Payer: Self-pay | Admitting: *Deleted

## 2016-08-20 ENCOUNTER — Other Ambulatory Visit: Payer: Self-pay | Admitting: *Deleted

## 2016-08-20 NOTE — Patient Outreach (Signed)
[place] [facility]) Care Management  08/20/2016  Krystal [name] 1936/02/05 [id number]   Krystal [name] is a 80 year old female with diabetes mellitus, nephropathy, peripheral neuropathy,dyslipidemia, hypertension, and PVD. Krystal [name] was originally referred to [place] Management for transition of care services after a hospital admission in December/January for hypertensive crisis. Most recently [place] Management has been assisting Krystal [name] with medication management and assistance and diabetes management and education.   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   She agreed to allow me a visit on 08/29/16 @ 1pm.   Plan: Home visit next week.    [employer] Management  [phone]

## 2016-08-27 ENCOUNTER — Other Ambulatory Visit: Payer: Self-pay | Admitting: Pharmacist

## 2016-08-27 NOTE — Patient Outreach (Signed)
[place] [facility]) Care Management  08/27/2016  Krystal [name] July 06, 1936 [id number]  Follow-up outreach call on 08/27/16 to patient regarding her patient assistance applications.  Patient answered call and verified HIPAA details.    Patient reports that she received Advair from [place] Patient Assistance Program, she reports she remembers receiving her Zetia (ezetimibe) from [employer] patient assistance program.   Patient states she is rinsing her mouth with water after each use of Advair.    Novo Nordisk patient assistance program:  Patient reports she found an updated proof of income statement.  She reports she is still waiting on proof of her out-of-pocket prescription spend from her pharmacy or her monthly insurance statement.  Reminded patient it needs to show she spent at least $1,000 out-of-pocket on prescriptions during calendar year 2017.    Placed a three way conference call with patient to [place] to follow-up on her application for assistance for Ranexa (ranolazine).  Representative reports that patient's application was denied due to patient being in the catastrophic coverage phase of her Part D plan.    After call to [place] was disconnected, patient asked about the catastrophic phase of Part D.  Counseled patient on the initial coverage phase, coverage gap, and catastrophic coverage phase in Medicare Part D plans.  She verbalized understanding.    Patient denies other pharmacy related questions/concerns.    Plan:  Will route this note to [place], who has a home visit with patient to let her know, patient may have copies of proof of income and/or proof of out-of-pocket prescription spend for her patient assistance application.   Will make another outreach attempt to patient within the next 3 weeks.    Karrie Meres, PharmD, [place] [phone]

## 2016-08-28 DIAGNOSIS — M542 Cervicalgia: Secondary | ICD-10-CM | POA: Diagnosis not present

## 2016-08-28 DIAGNOSIS — N183 Chronic kidney disease, stage 3 (moderate): Secondary | ICD-10-CM | POA: Diagnosis not present

## 2016-08-28 DIAGNOSIS — E1122 Type 2 diabetes mellitus with diabetic chronic kidney disease: Secondary | ICD-10-CM | POA: Diagnosis not present

## 2016-08-28 DIAGNOSIS — Z23 Encounter for immunization: Secondary | ICD-10-CM | POA: Diagnosis not present

## 2016-08-29 ENCOUNTER — Other Ambulatory Visit: Payer: Self-pay | Admitting: *Deleted

## 2016-08-29 ENCOUNTER — Encounter: Payer: Self-pay | Admitting: *Deleted

## 2016-08-29 NOTE — Patient Outreach (Signed)
[facility]) Care Management   08/29/2016  Krystal [name] 1936-01-21 [id number]  Krystal [name] is an 80 y.o. female with diabetes mellitus, nephropathy, peripheral neuropathy,dyslipidemia, hypertension, and PVD. Mrs. Krystal [name] was originally referred to [place] Management for transition of care services after a hospital admission in December/January for hypertensive crisis. Most recently [place] Management has been assisting Mrs. Krystal [name] with medication management and assistance and diabetes management and education.   I was scheduled to see Mrs. Krystal [name] at home today but there was a scheduling conflict. Mrs. Krystal [name] asked if I would return a call to her later this week to reschedule.   Plan: I will reach out to Mrs. Krystal [name] in Friday to reschedule our visit.    [place] Management  [phone]      ROS  Physical Exam

## 2016-08-31 ENCOUNTER — Encounter: Payer: Self-pay | Admitting: *Deleted

## 2016-08-31 ENCOUNTER — Other Ambulatory Visit: Payer: Self-pay | Admitting: *Deleted

## 2016-08-31 NOTE — Patient Outreach (Signed)
[place] [facility]) Care Management  08/31/2016  [name] 01/30/1936 [id number]  I returned a call to KrystalAguirre today to reschedule our home visit and discuss her diabetes management.   [name] says her fasting cbg this morning was 203. We discussed her most recent HgA1C and the one drawn 3 months earlier. She said she didn't understand the difference between the HgA1C and CBG. I explained it to her will provide more information to her when we visit next week.   [name] is very interested in nutrition and learning more about what foods are in her prescribed carb modified diet. We discussed her diet in some detail over the phone. She is clearly taking in a heavy carb load each day. We reviewed her prescribed carb modified diet and I have printed and will provide her with the [facility] educational materials designed to help patients with diabetes.   Plan: I will see [name] at home next week on Thursday.    [place] Management  [phone]

## 2016-09-06 ENCOUNTER — Other Ambulatory Visit: Payer: Self-pay | Admitting: *Deleted

## 2016-09-06 ENCOUNTER — Encounter: Payer: Self-pay | Admitting: *Deleted

## 2016-09-06 NOTE — Patient Outreach (Signed)
[facility]) Care Management   09/06/2016  Krystal [name] December 04, 1935 [id number]  Krystal [name] is an 80 y.o. female with diabetes mellitus, nephropathy, peripheral neuropathy,dyslipidemia, hypertension, and PVD. Krystal [name] was originally referred to [facility] for transition of care services after a hospital admission in December/January for hypertensive crisis. Most recently [facility] has been assisting Krystal [name] with medication management and assistance and diabetes management and education.   Subjective: "My neck is so stiff. It has been hurting over the last 3 days or so."  Objective:  BP (!) 152/80   Pulse 66   SpO2 98%   Review of Systems  Constitutional: Negative.   HENT: Negative.   Eyes: Positive for blurred vision.       Reports "sorta blurry" vision with higher CBG findings  Respiratory: Negative.   Cardiovascular: Negative.   Gastrointestinal: Negative.   Genitourinary: Negative.   Musculoskeletal: Positive for neck pain.       Complains of neck stiffness and decreased range of motion  Skin: Negative.   Neurological: Negative.   Psychiatric/Behavioral: Negative.     Physical Exam  Constitutional: She is oriented to person, place, and time. Vital signs are normal. She appears well-developed and well-nourished. She is active.  Non-toxic appearance. She does not have a sickly appearance.  Cardiovascular: Normal rate and regular rhythm.   Murmur heard. Respiratory: Effort normal. She has decreased breath sounds in the right lower field and the left lower field.  GI: Soft. Bowel sounds are normal.  Neurological: She is alert and oriented to person, place, and time.  Skin: Skin is warm, dry and intact.  Psychiatric: She has a normal mood and affect. Her speech is normal and behavior is normal. Judgment and thought content normal. Cognition and memory are normal.    Encounter Medications:   Outpatient Encounter  Prescriptions as of 09/06/2016  Medication Sig Note  . aspirin 81 MG EC tablet Take 81 mg by mouth every morning.    . calcitRIOL (ROCALTROL) 0.25 MCG capsule Take 0.25 mcg by mouth daily. Reported on 03/30/2016   . clopidogrel (PLAVIX) 75 MG tablet TAKE 1 TABLET ONCE DAILY.   Marland Kitchen clopidogrel (PLAVIX) 75 MG tablet TAKE 1 TABLET ONCE DAILY.   . cyclobenzaprine (FLEXERIL) 5 MG tablet Take 5 mg by mouth at bedtime.   Marland Kitchen ezetimibe (ZETIA) 10 MG tablet Take 1 tablet (10 mg total) by mouth every morning.   . famotidine (PEPCID) 20 MG tablet Take 1 tablet (20 mg total) by mouth daily.   . febuxostat (ULORIC) 40 MG tablet Take 40 mg by mouth daily.   . ferrous sulfate 325 (65 FE) MG tablet Take 325 mg by mouth daily.    . Fluticasone-Salmeterol (ADVAIR DISKUS) 250-50 MCG/DOSE AEPB Inhale 1 puff into the lungs 2 (two) times daily.   Marland Kitchen HYDROcodone-acetaminophen (NORCO/VICODIN) 5-325 MG tablet Take 1 tablet by mouth every 12 (twelve) hours as needed for moderate pain.   Marland Kitchen insulin aspart protamine - aspart (NOVOLOG 70/30 MIX) (70-30) 100 UNIT/ML FlexPen Inject 35-50 Units into the skin 2 (two) times daily before a meal. Inject 50 units just before  breakfast if blood glucose is above 90 and inject 35 units just before  supper if blood glucose is above 90. 05/29/2016: 50u am and 34u qhs  . Insulin Pen Needle (B-D ULTRAFINE III SHORT PEN) 31G X 8 MM MISC 1 each by Does not apply route as directed.   . isosorbide mononitrate (IMDUR)  60 MG 24 hr tablet TAKE 1 TABLET ONCE DAILY.   . isosorbide mononitrate (IMDUR) 60 MG 24 hr tablet TAKE 1 TABLET ONCE DAILY.   Marland Kitchen lisinopril (PRINIVIL,ZESTRIL) 10 MG tablet TAKE 1 TABLET ONCE DAILY.   . nebivolol (BYSTOLIC) 5 MG tablet Take 1 tablet (5 mg total) by mouth 2 (two) times daily.   . nitroGLYCERIN (NITROSTAT) 0.4 MG SL tablet Place 1 tablet (0.4 mg total) under the tongue every 5 (five) minutes as needed for chest pain. 04/30/2016: Has on hand if needed.   Glory Rosebush VERIO test  strip USE FOUR TIMES DAILY AS DIRECTED.   Marland Kitchen polyethylene glycol (MIRALAX / GLYCOLAX) packet Take 17 g by mouth daily as needed for mild constipation. 04/30/2016: Takes if needed  . RANEXA 500 MG 12 hr tablet TAKE (1) TABLET TWICE DAILY.   . rosuvastatin (CRESTOR) 20 MG tablet Take 1 tablet (20 mg total) by mouth every evening.   . torsemide (DEMADEX) 20 MG tablet MAY TAKE EXTRA 20MG  DAILY FOR SWELLING.    Assessment:  80 year old patient with knowledge deficits related to DM self management.   Chronic Health Condition (DM) - Krystal [name] has limited knowledge about her prescribed carb modified diet. She is typically eating 2 meals/day (breakfast and supper) and had almost no understanding of macronutrients. She is eating a carbohydrate rich diet and very few vegetables. Her protein intake is limited to occasional eggs. We discussed Krystal [name]'s prescribed diet at length and agreed that she would work on 2 changes: 1) modifying carbohydrate serving to 1/2 cup per meal and 2) adding red/green/orange vegetables to her daily diet.   Her cbg monitor does not record averages. Generally, her cbg findings are [phone] with the lowest noted finding @ 106 and the highest at 512. I advised Krystal [name] to keep a CBG/Food journal, noting what she last ate when she notes a reading of < 120 or > 300.   I reached out to Dr. Liliane Channel office to request an appointment.   Acute Health Condition (neck stiffness) - Krystal [name]'s neck is stiff today and she has limited range of motion. She says Dr. Berdine Addison started her on cyclobenzaprine a few weeks ago for this but she has experienced little relief. I reached out to Dr. Cathey Endow office today to update him on this symptom and requested that the provider office follow up with Krystal [name].    As requested, I received medication assistance forms to pass along to our pharmacy team.   Plan:   I will follow up with Krystal [name] with a home visit in 2 weeks to assess her  progress.   Krystal [name] will check cbg's daily and journal as outlined above.   Krystal [name] will make dietary changes as outlined above.   [facility] CM Care Plan Problem One   Flowsheet Row Most Recent Value  Care Plan Problem One  Knowledge deficit related to diabetes as evidenced by patient questions around diabetes  Role Documenting the Problem One  Care Management [place] for Problem One  Active  THN Long Term Goal (31-90 days)  Patient will verbalize understanding of diabetes management over the next 60 days  Interventions for Problem One Long Term Goal  reviewed cbg log and praised for better readings and more diet adherence  THN CM Short Term Goal #1 (0-30 days)  Over the next 30 days, patient will verbalize basic understanding of macronutritents recommended in prescribed carb modified diet  THN CM  Short Term Goal #1 Start Date  08/31/16  Interventions for Short Term Goal #1  Utilizing teachback method, reviewed recommended macronutrients and plate method for method of macronutrient management  THN CM Short Term Goal #2 (0-30 days)  Over the next 30 days, patient will check cbg's daily and record  THN CM Short Term Goal #2 Start Date  08/31/16  Interventions for Short Term Goal #2  Utilizing teachback method, reviewed with patient importance of adherence to prescribed cbg self monitoring regimen,  advised to check daily between now and our scheduled visit  THN CM Short Term Goal #3 (0-30 days)  Over the next 30 days, patient will call provider for symptoms of hypoglycemia or hyperglycemia  THN CM Short Term Goal #3 Start Date  08/31/16  Interventions for Short Tern Goal #3  Utilizing teachback method, reviewed signs and symptoms of hypo and hyperglycemia      [facility]  [phone]

## 2016-09-07 ENCOUNTER — Other Ambulatory Visit: Payer: Self-pay | Admitting: Cardiovascular Disease

## 2016-09-11 DIAGNOSIS — E1042 Type 1 diabetes mellitus with diabetic polyneuropathy: Secondary | ICD-10-CM | POA: Diagnosis not present

## 2016-09-20 ENCOUNTER — Other Ambulatory Visit: Payer: Self-pay | Admitting: Pharmacist

## 2016-09-20 ENCOUNTER — Encounter: Payer: Self-pay | Admitting: *Deleted

## 2016-09-20 ENCOUNTER — Other Ambulatory Visit: Payer: Self-pay | Admitting: *Deleted

## 2016-09-20 NOTE — Patient Outreach (Signed)
[facility]) Care Management  09/20/2016  KALAYAH MAZZARESE Jan 03, 1936 [id number]  Received in-basket message from [place], patient is interested in patient assistance options for [employer].    Successful outreach to patient who verified her HIPAA details.  [employer] patient assistance program appears to review Part D beneficiaries on a case-by-case basis.   Discussed with patient that with the end of the year coming up, I am not sure how long the company is accepting applications for 0000000 or for how long she may be able to get assistance if she were to apply and be eligible.    Also discussed with patient that proof of out-pocket spend she provided for [employer] application was from [id number], and she needs to show proof of $1,000 spent on prescriptions for 2017----she reports she has this.   She is aware she needs to submit this to [employer] Patient Assistance in order for her application to be evaluated.    Plan:  Will send patient [employer] application via the mail---she is aware to complete and return it as soon as possible given the end of the year coming up.    Karrie Meres, PharmD, [place] [phone]

## 2016-09-20 NOTE — Patient Outreach (Signed)
[facility]) Care [name]   09/20/2016  Krystal [name] 1936/03/17 [id number]  Krystal [name] is an 80 y.o. female with diabetes mellitus, nephropathy, peripheral neuropathy,dyslipidemia, hypertension, and PVD. Krystal [name] was originally referred to Krystal [name] for transition of care services after a hospital admission in December/January for hypertensive crisis. Most recently Krystal [name] has been assisting Krystal [name] with medication [name] and assistance and diabetes [name] and education.   Subjective: "I think I'm doing okay. My sugar is down a little bit."  Objective:  BP 130/80   Pulse 92   SpO2 98%   Review of Systems  Constitutional: Positive for malaise/fatigue.  HENT: Negative.   Eyes: Negative.   Respiratory: Negative.   Cardiovascular: Negative.   Gastrointestinal: Negative.   Genitourinary: Negative.   Musculoskeletal: Positive for myalgias. Negative for falls.  Skin: Negative.   Neurological: Negative.  Negative for weakness.  Psychiatric/Behavioral: Negative.     Physical Exam  Constitutional: She is oriented to person, place, and time. Vital signs are normal. She appears well-developed and well-nourished. She is active. She does not have a sickly appearance. She does not appear ill.  Cardiovascular: Normal rate and regular rhythm.   Respiratory: Effort normal and breath sounds normal. She has no wheezes. She has no rhonchi. She has no rales.  GI: Soft. Bowel sounds are normal. There is no tenderness.  Neurological: She is alert and oriented to person, place, and time.  Skin: Skin is warm, dry and intact.  Psychiatric: She has a normal mood and affect. Her speech is normal and behavior is normal. Judgment and thought content normal. Cognition and memory are normal.    Encounter Medications:   Outpatient Encounter Prescriptions as of 09/20/2016  Medication Sig Note  . aspirin 81 MG EC tablet Take 81 mg by mouth  every morning.    . calcitRIOL (ROCALTROL) 0.25 MCG capsule Take 0.25 mcg by mouth daily. Reported on 03/30/2016   . clopidogrel (PLAVIX) 75 MG tablet TAKE 1 TABLET ONCE DAILY.   Marland Kitchen clopidogrel (PLAVIX) 75 MG tablet TAKE 1 TABLET ONCE DAILY. (Patient not taking: Reported on 09/06/2016)   . cyclobenzaprine (FLEXERIL) 5 MG tablet Take 5 mg by mouth at bedtime.   . famotidine (PEPCID) 20 MG tablet Take 1 tablet (20 mg total) by mouth daily.   . febuxostat (ULORIC) 40 MG tablet Take 40 mg by mouth daily. 09/06/2016: Requested transition from bottle to blister pack beginning of November; confirmed with pharmacist @ [place] 09/06/2016   . ferrous sulfate 325 (65 FE) MG tablet Take 325 mg by mouth daily.  09/06/2016: Requested transition from bottle to blister pack beginning of November; confirmed with pharmacist @ [place] 09/06/2016  . Fluticasone-Salmeterol (ADVAIR DISKUS) 250-50 MCG/DOSE AEPB Inhale 1 puff into the lungs 2 (two) times daily.   Marland Kitchen HYDROcodone-acetaminophen (NORCO/VICODIN) 5-325 MG tablet Take 1 tablet by mouth every 12 (twelve) hours as needed for moderate pain.   Marland Kitchen insulin aspart protamine - aspart (NOVOLOG 70/30 MIX) (70-30) 100 UNIT/ML FlexPen Inject 35-50 Units into the skin 2 (two) times daily before a meal. Inject 50 units just before  breakfast if blood glucose is above 90 and inject 35 units just before  supper if blood glucose is above 90. 09/06/2016: 50U QAM AND 35U QPM  . Insulin Pen Needle (B-D ULTRAFINE III SHORT PEN) 31G X 8 MM MISC 1 each by Does not apply route as directed.   . isosorbide mononitrate (IMDUR) 60 MG  24 hr tablet TAKE 1 TABLET ONCE DAILY.   . isosorbide mononitrate (IMDUR) 60 MG 24 hr tablet TAKE 1 TABLET ONCE DAILY. (Patient not taking: Reported on 09/06/2016)   . lisinopril (PRINIVIL,ZESTRIL) 10 MG tablet TAKE 1 TABLET ONCE DAILY.   . nebivolol (BYSTOLIC) 5 MG tablet Take 1 tablet (5 mg total) by mouth 2 (two) times daily. (Patient not taking:  Reported on 09/06/2016)   . nitroGLYCERIN (NITROSTAT) 0.4 MG SL tablet Place 1 tablet (0.4 mg total) under the tongue every 5 (five) minutes as needed for chest pain. 04/30/2016: Has on hand if needed.   Glory Rosebush VERIO test strip USE FOUR TIMES DAILY AS DIRECTED.   Marland Kitchen polyethylene glycol (MIRALAX / GLYCOLAX) packet Take 17 g by mouth daily as needed for mild constipation. 04/30/2016: Takes if needed  . RANEXA 500 MG 12 hr tablet TAKE (1) TABLET TWICE DAILY.   . rosuvastatin (CRESTOR) 20 MG tablet Take 1 tablet (20 mg total) by mouth every evening.   . torsemide (DEMADEX) 20 MG tablet MAY TAKE EXTRA 20MG  DAILY FOR SWELLING.   . ZETIA 10 MG tablet TAKE 1 TABLET BY MOUTH EVERY MORNING.    Assessment:  80 year old patient with knowledge deficits related to DM self [name].   Chronic Health Condition (DM) - Krystal [name] has limited knowledge about her prescribed carb modified diet. She is typically eating 2 meals/day (breakfast and supper) and had almost no understanding of macronutrients. She is eating a carbohydrate rich diet and very few vegetables. Her protein intake is limited to occasional eggs. We discussed Krystal [name] prescribed diet at length and agreed that she would work on 2 changes: 1) modifying carbohydrate serving to 1/2 cup per meal and 2) adding red/green/orange vegetables to her daily diet.   Her cbg monitor does not record averages. Generally, her cbg findings are [phone] with the lowest noted finding @ 109 and the highest at 447 (did not take insulin previous). I advised Krystal [name] to keep a CBG/Food journal, noting what she last ate when she notes a reading of < 120 or > 300.  **Vegetables in evening when not hungry for full meal  I reached out to Dr. Liliane Channel office to request an appointment today at Krystal [name] reqeust. She is scheduled to see Dr. Dorris Fetch on 09/25/16 @ 3pm.    Acute Health Condition (neck stiffness) - Krystal [name]'s neck was stiff on our last visit. She  has since seen Dr. Berdine Addison and said he prescribed a muscle relaxer that has significantly helped her neck stiffness. She has much improved range of motion today. She says she is also using Biofreeze topically which assists in relief of the discomfort.   Mobility Concerns - KrystalCutino is interested in the possibility of taking a short trip to [place], [place]. To see family. We called [facility] to see if portable ramps are available for rent. They do rent a 30ft ramp or 48ft (collapsable) for $25/week or $50/month. Mrs. Waligorski will take this into consideration as she plans.   Plan:   I will follow up with Mrs. Keshishyan with a home visit in 2 weeks to assess her progress.   Mrs. Nawrot will check cbg's daily and journal as outlined above.   Mrs. Penix will make dietary changes as outlined above.   Mrs. Parga will see Dr. Dorris Fetch on 09/25/16 as scheduled.   [facility] CM Care Plan Problem One   Flowsheet Row Most Recent Value  Care Plan Problem One  Knowledge deficit related to diabetes as evidenced by patient questions around diabetes  Role Documenting the Problem One  Care [name] [place] for Problem One  Active  THN Long Term Goal (31-90 days)  Patient will verbalize understanding of diabetes [name] over the next 60 days  Interventions for Problem One Long Term Goal  reviewed cbg log and praised for better readings and more diet adherence  THN CM Short Term Goal #1 (0-30 days)  Over the next 30 days, patient will verbalize basic understanding of macronutritents recommended in prescribed carb modified diet  THN CM Short Term Goal #1 Start Date  08/31/16  Interventions for Short Term Goal #1  Utilizing teachback method, reviewed recommended macronutrients and plate method for method of macronutrient [name]  THN CM Short Term Goal #2 (0-30 days)  Over the next 30 days, patient will check cbg's daily and record  THN CM Short Term Goal #2 Start Date  08/31/16   Interventions for Short Term Goal #2  Utilizing teachback method, reviewed with patient importance of adherence to prescribed cbg self monitoring regimen,  advised to check daily between now and our scheduled visit  THN CM Short Term Goal #3 (0-30 days)  Over the next 30 days, patient will call provider for symptoms of hypoglycemia or hyperglycemia  THN CM Short Term Goal #3 Start Date  08/31/16  Interventions for Short Tern Goal #3  Utilizing teachback method, reviewed signs and symptoms of hypo and hyperglycemia      Janalyn Shy MHA,BSN,RN,CCM [facility] Care [name]  [phone]

## 2016-09-21 ENCOUNTER — Encounter: Payer: Self-pay | Admitting: Pharmacist

## 2016-09-25 ENCOUNTER — Ambulatory Visit (INDEPENDENT_AMBULATORY_CARE_PROVIDER_SITE_OTHER): Payer: Medicare Other | Admitting: "Endocrinology

## 2016-09-25 ENCOUNTER — Encounter: Payer: Self-pay | Admitting: "Endocrinology

## 2016-09-25 VITALS — BP 147/73 | HR 99 | Ht 68.0 in | Wt 200.0 lb

## 2016-09-25 DIAGNOSIS — Z794 Long term (current) use of insulin: Secondary | ICD-10-CM | POA: Diagnosis not present

## 2016-09-25 DIAGNOSIS — I25118 Atherosclerotic heart disease of native coronary artery with other forms of angina pectoris: Secondary | ICD-10-CM | POA: Diagnosis not present

## 2016-09-25 DIAGNOSIS — E782 Mixed hyperlipidemia: Secondary | ICD-10-CM | POA: Diagnosis not present

## 2016-09-25 DIAGNOSIS — I10 Essential (primary) hypertension: Secondary | ICD-10-CM

## 2016-09-25 DIAGNOSIS — E1122 Type 2 diabetes mellitus with diabetic chronic kidney disease: Secondary | ICD-10-CM | POA: Diagnosis not present

## 2016-09-25 DIAGNOSIS — N184 Chronic kidney disease, stage 4 (severe): Secondary | ICD-10-CM | POA: Diagnosis not present

## 2016-09-25 NOTE — Progress Notes (Signed)
Subjective:    Patient ID: Krystal [name], female    DOB: [birth year], PCP [name], MD   Past Medical History:  Diagnosis Date  . Allergic rhinitis   . CAD (coronary artery disease) 2009   5 stents- #3 in RCA, #1 each in LAD and AVG  . CKD (chronic kidney disease) stage 3, GFR 30-59 ml/min   . Complete heart block, transient 2014  . Diastolic dysfunction, left ventricle   . DJD (degenerative joint disease)   . Emphysema lung (HCC)    2L N/C continuously  . Essential hypertension   . GERD (gastroesophageal reflux disease)   . Glaucoma   . Gout   . History of uterine cancer   . HOH (hard of hearing)   . Hyperlipidemia   . MI (myocardial infarction) 1998  . Microcytic anemia    History of occult blood in stool  . Myalgia   . Osteoarthritis   . Palpitations   . Personal history of colonic polyps   . Sleep apnea    CPAP  . Type II diabetes mellitus with nephropathy [facility])    Past Surgical History:  Procedure Laterality Date  . ABDOMINAL HYSTERECTOMY  04/2010   Uterine cancer,TAHBSO  . CERVICAL DISCECTOMY     L5 left/hemilminectomy  . COLONOSCOPY  09/2006   Int hemmorhoids, COMPLICATED BY CARDIOPULMONARY COMPLICATIONS  . CORONARY ANGIOPLASTY  1/99, 1/07, 1/08, 4/09   5 cardiac stents total  . ECTOPIC PREGNANCY SURGERY    . FOOT SURGERY     Left and right for callous  . LEFT AND RIGHT HEART CATHETERIZATION WITH CORONARY ANGIOGRAM N/A 09/12/2012   Procedure: LEFT AND RIGHT HEART CATHETERIZATION WITH CORONARY ANGIOGRAM;  Surgeon: Sanda Klein, MD;  Location: [facility]ATH [facility];  Service: Cardiovascular;  Laterality: N/A;  . POLYPECTOMY  10/22/2011   Internal hemorrhoids/sessile polyp   Social History   Social History  . Marital status: Widowed    Spouse name: N/A  . Number of children: 0  . Years of education: 12   Occupational History  .      retired   Social History Main Topics  . Smoking status: Former Smoker    Packs/day: 0.40    Years: 51.00   Types: Cigarettes    Start date: 04/22/1955    Quit date: 10/18/2006  . Smokeless tobacco: Never Used     Comment: quit about 4 yrs ago  . Alcohol use No  . Drug use: No  . Sexual activity: No   Other Topics Concern  . None   Social History Narrative   Consumes 2 cups of caffeine daily   Outpatient Encounter Prescriptions as of 09/25/2016  Medication Sig  . aspirin 81 MG EC tablet Take 81 mg by mouth every morning.   . calcitRIOL (ROCALTROL) 0.25 MCG capsule Take 0.25 mcg by mouth daily. Reported on 03/30/2016  . clopidogrel (PLAVIX) 75 MG tablet TAKE 1 TABLET ONCE DAILY.  . cyclobenzaprine (FLEXERIL) 5 MG tablet Take 5 mg by mouth at bedtime.  . famotidine (PEPCID) 20 MG tablet Take 1 tablet (20 mg total) by mouth daily.  . febuxostat (ULORIC) 40 MG tablet Take 40 mg by mouth daily.  . ferrous sulfate 325 (65 FE) MG tablet Take 325 mg by mouth daily.   . Fluticasone-Salmeterol (ADVAIR DISKUS) 250-50 MCG/DOSE AEPB Inhale 1 puff into the lungs 2 (two) times daily.  Marland Kitchen HYDROcodone-acetaminophen (NORCO/VICODIN) 5-325 MG tablet Take 1 tablet by mouth every 12 (twelve) hours  as needed for moderate pain.  Marland Kitchen insulin aspart protamine - aspart (NOVOLOG 70/30 MIX) (70-30) 100 UNIT/ML FlexPen Inject 35-50 Units into the skin 2 (two) times daily before a meal. Inject 50 units just before  breakfast if blood glucose is above 90 and inject 40 units just before  supper if blood glucose is above 90.  . Insulin Pen Needle (B-D ULTRAFINE III SHORT PEN) 31G X 8 MM MISC 1 each by Does not apply route as directed.  . isosorbide mononitrate (IMDUR) 60 MG 24 hr tablet TAKE 1 TABLET ONCE DAILY.  Marland Kitchen lisinopril (PRINIVIL,ZESTRIL) 10 MG tablet TAKE 1 TABLET ONCE DAILY.  . nebivolol (BYSTOLIC) 5 MG tablet Take 1 tablet (5 mg total) by mouth 2 (two) times daily. (Patient not taking: Reported on 09/06/2016)  . nitroGLYCERIN (NITROSTAT) 0.4 MG SL tablet Place 1 tablet (0.4 mg total) under the tongue every 5 (five) minutes  as needed for chest pain.  Glory Rosebush VERIO test strip USE FOUR TIMES DAILY AS DIRECTED.  Marland Kitchen polyethylene glycol (MIRALAX / GLYCOLAX) packet Take 17 g by mouth daily as needed for mild constipation.  Marland Kitchen RANEXA 500 MG 12 hr tablet TAKE (1) TABLET TWICE DAILY.  . rosuvastatin (CRESTOR) 20 MG tablet Take 1 tablet (20 mg total) by mouth every evening.  . torsemide (DEMADEX) 20 MG tablet MAY TAKE EXTRA 20MG  DAILY FOR SWELLING.  . ZETIA 10 MG tablet TAKE 1 TABLET BY MOUTH EVERY MORNING.  . [DISCONTINUED] clopidogrel (PLAVIX) 75 MG tablet TAKE 1 TABLET ONCE DAILY. (Patient not taking: Reported on 09/06/2016)  . [DISCONTINUED] isosorbide mononitrate (IMDUR) 60 MG 24 hr tablet TAKE 1 TABLET ONCE DAILY. (Patient not taking: Reported on 09/06/2016)   No facility-administered encounter medications on file as of 09/25/2016.    ALLERGIES: Allergies  Allergen Reactions  . Morphine Shortness Of Breath and Swelling  . Penicillins Shortness Of Breath and Swelling    Has patient had a PCN reaction causing immediate rash, facial/tongue/throat swelling, SOB or lightheadedness with hypotension: No Has patient had a PCN reaction causing severe rash involving mucus membranes or skin necrosis: No Has patient had a PCN reaction that required hospitalization Yes Has patient had a PCN reaction occurring within the last 10 years: No If all of the above answers are "NO", then may proceed with Cephalosporin use.   . Shellfish Allergy    VACCINATION STATUS: Immunization History  Administered Date(s) Administered  . Influenza Whole 09/23/2007  . Influenza,inj,Quad PF,36+ Mos 08/20/2015  . Influenza-Unspecified 09/19/2013  . Pneumococcal Polysaccharide-23 08/20/2015    Diabetes  She presents for her follow-up diabetic visit. She has type 2 diabetes mellitus. Onset time: She was diagnosed at approximate age of 45 years. Her disease course has been worsening. There are no hypoglycemic associated symptoms. Pertinent  negatives for hypoglycemia include no confusion, headaches, pallor or seizures. Associated symptoms include fatigue, polydipsia and polyuria. Pertinent negatives for diabetes include no chest pain and no polyphagia. There are no hypoglycemic complications. Symptoms are worsening. Diabetic complications include heart disease, nephropathy, peripheral neuropathy and PVD. Risk factors for coronary artery disease include dyslipidemia, diabetes mellitus, hypertension, obesity, sedentary lifestyle and tobacco exposure. Current diabetic treatment includes insulin injections (She was asked to bring all of her insulin supplies at home. She managed to bring to bags of various kinds of insulin mainly duplicates of long-acting, premixed, and rapid acting insulin types. At least half of the insulin pens she brought in are expired .). She is following a generally unhealthy diet.  She has had a previous visit with a dietitian. She never participates in exercise. Her home blood glucose trend is fluctuating dramatically (He did brought her meter and log showing above target BG readings , monitoring 2-3 times a day, E AG between 100-170.). Her breakfast blood glucose range is generally >200 mg/dl. Her lunch blood glucose range is generally >200 mg/dl. Her dinner blood glucose range is generally >200 mg/dl. Her overall blood glucose range is >200 mg/dl. An ACE inhibitor/angiotensin II receptor blocker is being taken.  Hyperlipidemia  This is a chronic problem. The current episode started more than 1 year ago. Exacerbating diseases include diabetes and obesity. Pertinent negatives include no chest pain, myalgias or shortness of breath. Current antihyperlipidemic treatment includes bile acid squestrants. Risk factors for coronary artery disease include diabetes mellitus, dyslipidemia, hypertension and obesity.  Hypertension  This is a chronic problem. The current episode started more than 1 year ago. Pertinent negatives include no  chest pain, headaches, palpitations or shortness of breath. Risk factors for coronary artery disease include diabetes mellitus, dyslipidemia, obesity, sedentary lifestyle and smoking/tobacco exposure. Past treatments include ACE inhibitors. Hypertensive end-organ damage includes kidney disease, CAD/MI and PVD.     Review of Systems  Constitutional: Positive for fatigue. Negative for unexpected weight change.  HENT: Negative for trouble swallowing and voice change.   Eyes: Negative for visual disturbance.  Respiratory: Negative for cough, shortness of breath and wheezing.   Cardiovascular: Negative for chest pain, palpitations and leg swelling.  Gastrointestinal: Negative for diarrhea, nausea and vomiting.  Endocrine: Positive for polydipsia and polyuria. Negative for cold intolerance, heat intolerance and polyphagia.  Musculoskeletal: Negative for arthralgias and myalgias.  Skin: Negative for color change, pallor, rash and wound.  Neurological: Negative for seizures and headaches.  Psychiatric/Behavioral: Negative for confusion and suicidal ideas.    Objective:    BP (!) 147/73   Pulse 99   Ht 5\' 8"  (1.727 m)   Wt 200 lb (90.7 kg)   BMI 30.41 kg/m   Wt Readings from Last 3 Encounters:  09/25/16 200 lb (90.7 kg)  07/20/16 192 lb (87.1 kg)  06/29/16 197 lb (89.4 kg)    Physical Exam  Constitutional: She is oriented to person, place, and time. She appears well-developed.  HENT:  Head: Normocephalic and atraumatic.  Eyes: EOM are normal.  Neck: Normal range of motion. Neck supple. No tracheal deviation present. No thyromegaly present.  Cardiovascular: Normal rate and regular rhythm.   Pulses:      Dorsalis pedis pulses are 0 on the right side, and 0 on the left side.       Posterior tibial pulses are 0 on the right side, and 0 on the left side.  She has absent pulsations on right lateral dorsalis pedis and posterior tibial arteries.  Pulmonary/Chest: Effort normal and breath  sounds normal.  Abdominal: Soft. Bowel sounds are normal. There is no tenderness. There is no guarding.  Musculoskeletal: Normal range of motion. She exhibits no edema.  Neurological: She is alert and oriented to person, place, and time. She has normal reflexes. A sensory deficit is present. No cranial nerve deficit. Coordination normal.  She has significant deficit on monofilament test on bilateral feet.  Skin: Skin is warm and dry. No rash noted. No erythema. No pallor.  Psychiatric:  She is significantly reluctant with possible cognitive deficit.     Complete Blood Count (Most recent): Lab Results  Component Value Date   WBC 8.3 11/08/2015   HGB  9.9 (L) 11/08/2015   HCT 32.6 (L) 11/08/2015   MCV 75.8 (L) 11/08/2015   PLT 263 11/08/2015   Chemistry (most recent): Lab Results  Component Value Date   NA 142 07/04/2016   K 4.0 07/04/2016   CL 101 07/04/2016   CO2 30 07/04/2016   BUN 44 (H) 07/04/2016   CREATININE 2.49 (H) 07/04/2016   Diabetic Labs (most recent): Lab Results  Component Value Date   HGBA1C 11.3 (H) 07/04/2016   HGBA1C 12.2 (H) 05/07/2016   HGBA1C 11.7 (H) 01/12/2016   Lipid Panel     Component Value Date/Time   CHOL 151 05/07/2016 1006   TRIG 217 (H) 05/07/2016 1006   HDL 37 (L) 05/07/2016 1006   CHOLHDL 4.1 05/07/2016 1006   VLDL 43 (H) 05/07/2016 1006   LDLCALC 71 05/07/2016 1006   LDLDIRECT 118.4 08/13/2013 1502     Assessment & Plan:   1. Uncontrolled type 2 diabetes mellitus with other specified complication ([place])  - Her  diabetes is  complicated by coronary artery disease, chronic kidney disease, peripheral neuropathy, and peripheral arterial disease and patient remains at a high risk for more acute and chronic complications of diabetes which include CAD, CVA, CKD, retinopathy, and neuropathy. These are all discussed in detail with the patient.  her recent A1c is 11.3% slightly better than her last labs that 12.2%. -She admits significant  dietary indiscretion, admits to ingestion of large amounts of soda and processed carbohydrates. Recent labs reviewed.   - I have re-counseled the patient on diet management  by adopting a carbohydrate restricted / protein rich  Diet.  - Suggestion is made for patient to avoid simple carbohydrates   from their diet including Cakes , Desserts, Ice Cream,  Soda (  diet and regular) , Sweet Tea , Candies,  Chips, Cookies, Artificial Sweeteners,   and "Sugar-free" Products .  This will help patient to have stable blood glucose profile and potentially avoid unintended  Weight gain.  - Patient is advised to stick to a routine mealtimes to eat 3 meals  a day and avoid unnecessary snacks ( to snack only to correct hypoglycemia).  - The patient  has been  scheduled with Jearld Fenton, RDN, CDE for individualized DM education.  - I have approached patient with the following individualized plan to manage diabetes and patient agrees.   -Mrs. Cerio  would have benefited from basal/bolus insulin however this was too overwhelming for her.  she lives alone. - She came with slightly better blood glucose profile.  She will be continued on premixed insulin Novolog 70/30, increase to 50 units with breakfast and 40 units with supper for pre-meal blood glucose above 90 mg/dL. I have taken away all of the insulin production protein to avoid inadvertent insulin overdose and hypoglycemia.  -Patient is encouraged to call clinic for blood glucose levels less than 70 or above 300 mg /dl.  -She will be taken off of Victoza.   - Patient specific target  for A1c; LDL, HDL, Triglycerides, and  Waist Circumference were discussed in detail.  2) BP/HTN: Uncontrolled, patient could not confirm if she is consistent taking her blood pressure medications. I advised her to resume her current medications including ACEI/ARB. 3) Lipids/HPL:  continue ezetimibe, continue Crestor 20 mg by mouth daily at bedtime.   4)   Weight/Diet: CDE consult in progress, exercise, and carbohydrates information provided.  5) Chronic Care/Health Maintenance:  -Patient  on ACEI/ARB and Statin medications and encouraged to  continue to follow up with Ophthalmology, Podiatrist at least yearly or according to recommendations, and advised to  stay away from smoking. I have recommended yearly flu vaccine and pneumonia vaccination at least every 5 years; moderate intensity exercise for up to 150 minutes weekly; and  sleep for at least 7 hours a day. - She has significant peripheral neuropathy and peripheral arterial disease putting her at risk for diabetes foot ulcer. She is being evaluated by her podiatrist for custom shoes. She would benefit from this therapeutic measure. I have signed off on her paperwork for same. - 25 minutes of time was spent on the care of this patient , 50% of which was applied for counseling on diabetes complications and their preventions.  - I advised patient to maintain close follow up with [name], MD for primary care needs.  Patient is asked to bring meter and  blood glucose logs during their next visit.   Follow up plan: -Return in about 3 months (around 12/26/2016) for follow up with pre-visit labs, meter, and logs.  Glade Lloyd, MD Phone: [phone]  Fax: [phone]   09/25/2016, 2:44 PM

## 2016-09-25 NOTE — Patient Instructions (Signed)

## 2016-10-08 ENCOUNTER — Other Ambulatory Visit: Payer: Self-pay | Admitting: *Deleted

## 2016-10-08 NOTE — Patient Outreach (Signed)
Krystal [name]) Care Management   [id number]  Krystal [name] June 25, 1936 [id number]  Krystal [name] is an 81 y.o. female with diabetes mellitus, nephropathy, peripheral neuropathy,dyslipidemia, hypertension, and PVD. Krystal [name] was originally referred to [facility] for transition of care services after a hospital admission in December/January for hypertensive crisis. Most recently [facility] has been assisting Krystal [name] with medication management and assistance and diabetes management and education.   Subjective: "I can't keep up with what all those numbers mean"  Objective:  BP (!) 148/80   Pulse 83   SpO2 98%   Review of Systems  Constitutional: Negative.   HENT: Negative.   Eyes: Negative.   Respiratory: Negative.   Cardiovascular: Negative.   Gastrointestinal: Negative.   Genitourinary: Negative.   Musculoskeletal: Positive for myalgias. Negative for falls.  Skin: Negative.   Neurological: Negative.   Psychiatric/Behavioral: Negative.     Physical Exam  Constitutional: She is oriented to person, place, and time. Vital signs are normal. She appears well-developed and well-nourished. She is active. She does not have a sickly appearance. She does not appear ill.  Cardiovascular: Normal rate and regular rhythm.   Respiratory: Effort normal and breath sounds normal. She has no wheezes. She has no rhonchi. She has no rales.  GI: Soft. Bowel sounds are normal.  Neurological: She is alert and oriented to person, place, and time.  Skin: Skin is warm and dry.  Psychiatric: She has a normal mood and affect. Her speech is normal and behavior is normal. Judgment and thought content normal. She exhibits abnormal recent memory.  Mild difficulty with short term memory and recall    Encounter Medications:   Outpatient Encounter Prescriptions as of [id number]  Medication Sig Note  . aspirin 81 MG EC tablet Take 81 mg by mouth every morning.    .  calcitRIOL (ROCALTROL) 0.25 MCG capsule Take 0.25 mcg by mouth daily. Reported on 03/30/2016   . clopidogrel (PLAVIX) 75 MG tablet TAKE 1 TABLET ONCE DAILY.   . cyclobenzaprine (FLEXERIL) 5 MG tablet Take 5 mg by mouth at bedtime.   . famotidine (PEPCID) 20 MG tablet Take 1 tablet (20 mg total) by mouth daily.   . febuxostat (ULORIC) 40 MG tablet Take 40 mg by mouth daily. 09/06/2016: Requested transition from bottle to blister pack beginning of November; confirmed with pharmacist @ [facility] 09/06/2016   . ferrous sulfate 325 (65 FE) MG tablet Take 325 mg by mouth daily.  09/06/2016: Requested transition from bottle to blister pack beginning of November; confirmed with pharmacist @ [facility] 09/06/2016  . Fluticasone-Salmeterol (ADVAIR DISKUS) 250-50 MCG/DOSE AEPB Inhale 1 puff into the lungs 2 (two) times daily.   Marland Kitchen HYDROcodone-acetaminophen (NORCO/VICODIN) 5-325 MG tablet Take 1 tablet by mouth every 12 (twelve) hours as needed for moderate pain.   Marland Kitchen insulin aspart protamine - aspart (NOVOLOG 70/30 MIX) (70-30) 100 UNIT/ML FlexPen Inject 35-50 Units into the skin 2 (two) times daily before a meal. Inject 50 units just before  breakfast if blood glucose is above 90 and inject 40 units just before  supper if blood glucose is above 90. 09/06/2016: 50U QAM AND 35U QPM  . Insulin Pen Needle (B-D ULTRAFINE III SHORT PEN) 31G X 8 MM MISC 1 each by Does not apply route as directed.   . isosorbide mononitrate (IMDUR) 60 MG 24 hr tablet TAKE 1 TABLET ONCE DAILY.   Marland Kitchen lisinopril (PRINIVIL,ZESTRIL) 10 MG tablet TAKE 1 TABLET ONCE  DAILY.   . nebivolol (BYSTOLIC) 5 MG tablet Take 1 tablet (5 mg total) by mouth 2 (two) times daily. (Patient not taking: Reported on 09/06/2016)   . nitroGLYCERIN (NITROSTAT) 0.4 MG SL tablet Place 1 tablet (0.4 mg total) under the tongue every 5 (five) minutes as needed for chest pain. 04/30/2016: Has on hand if needed.   Glory Rosebush VERIO test strip USE FOUR TIMES DAILY AS  DIRECTED.   Marland Kitchen polyethylene glycol (MIRALAX / GLYCOLAX) packet Take 17 g by mouth daily as needed for mild constipation. 04/30/2016: Takes if needed  . RANEXA 500 MG 12 hr tablet TAKE (1) TABLET TWICE DAILY.   . rosuvastatin (CRESTOR) 20 MG tablet Take 1 tablet (20 mg total) by mouth every evening.   . torsemide (DEMADEX) 20 MG tablet MAY TAKE EXTRA 20MG  DAILY FOR SWELLING.   . ZETIA 10 MG tablet TAKE 1 TABLET BY MOUTH EVERY MORNING.    Assessment:  80 year old patient with knowledge deficits related to DM self management.   Chronic Health Condition (DM)- Krystal [name] saw Krystal [name] in the office on 09/25/16. Her labs showed improvement in HgA1c from 12.2% to 11.3%.   Krystal [name] made the following recommendations regarding Krystal [name] prescribed carb modified diet:   1. carbohydrate restricted / protein rich diet  2. avoid simple carbohydrates including Cakes , Desserts, Ice Cream,  Soda (diet and regular) , Sweet Tea , Candies,  Chips, Cookies, Artificial Sweeteners,   and "Sugar-free" Products  3. stick to a routine mealtimes to eat 3 meals  a day and avoid unnecessary snacks ( to snack only to correct hypoglycemia).  4. See Krystal [name], RDN, CDE for individualized DM education  Krystal [name] made the following recommendations regarding Krystal [name] medication regimen:   1. continue Insulin Novolog 70/30, increase to 50 units with breakfast and 40 units with supper for pre-meal blood glucose above 90 mg/dL  2. discontinue Victoza  Further recommendations from Krystal [name] for glucose management:   1. call clinic for blood glucose levels less than 70 or above 300 mg /dl.  2. Agree with podiatry recommendation for custom shoes w/ signed paperwork for same  3. bring meter and  blood glucose logs during her next visit  4. return in about 3 months (around 12/26/2016) for follow up with pre-visit labs, meter, and logs  I have created an instruction sheet with all the above instructions  in large print and have reviewed it with Krystal [name] in person today.   Medication Management/Medication Assistance - Krystal [name] and I worked together to complete medication assistance applications for [employer] and [place] which were sent to her by Karrie Meres, PharmD with [facility]. I will forward this information to Dr. Jenna Luo.   Plan:   I will follow up with Mrs. Kraemer by phone.   Mrs. Stranger will check cbg's daily and journal as outlined above.   Mrs. Daigneault will make dietary changes as outlined above/recommended by Krystal [name].    [facility]  [phone]

## 2016-10-09 ENCOUNTER — Other Ambulatory Visit: Payer: Self-pay | Admitting: "Endocrinology

## 2016-10-09 ENCOUNTER — Encounter: Payer: Self-pay | Admitting: Cardiovascular Disease

## 2016-10-09 ENCOUNTER — Ambulatory Visit (INDEPENDENT_AMBULATORY_CARE_PROVIDER_SITE_OTHER): Payer: Medicare Other | Admitting: Cardiovascular Disease

## 2016-10-09 ENCOUNTER — Ambulatory Visit (HOSPITAL_COMMUNITY)
Admission: RE | Admit: 2016-10-09 | Discharge: 2016-10-09 | Disposition: A | Discharge status: Home / Self Care | Payer: Medicare Other | Source: Ambulatory Visit | Attending: Cardiovascular Disease | Admitting: Cardiovascular Disease

## 2016-10-09 ENCOUNTER — Other Ambulatory Visit: Payer: Self-pay | Admitting: Pharmacist

## 2016-10-09 VITALS — BP 138/70 | HR 93 | Ht 67.0 in | Wt 199.0 lb

## 2016-10-09 DIAGNOSIS — I25118 Atherosclerotic heart disease of native coronary artery with other forms of angina pectoris: Secondary | ICD-10-CM | POA: Diagnosis not present

## 2016-10-09 DIAGNOSIS — J438 Other emphysema: Secondary | ICD-10-CM | POA: Diagnosis not present

## 2016-10-09 DIAGNOSIS — E7800 Pure hypercholesterolemia, unspecified: Secondary | ICD-10-CM

## 2016-10-09 DIAGNOSIS — R0609 Other forms of dyspnea: Secondary | ICD-10-CM | POA: Insufficient documentation

## 2016-10-09 DIAGNOSIS — N183 Chronic kidney disease, stage 3 unspecified: Secondary | ICD-10-CM

## 2016-10-09 DIAGNOSIS — R42 Dizziness and giddiness: Secondary | ICD-10-CM

## 2016-10-09 DIAGNOSIS — I5032 Chronic diastolic (congestive) heart failure: Secondary | ICD-10-CM | POA: Diagnosis not present

## 2016-10-09 DIAGNOSIS — R0602 Shortness of breath: Secondary | ICD-10-CM | POA: Diagnosis not present

## 2016-10-09 DIAGNOSIS — I10 Essential (primary) hypertension: Secondary | ICD-10-CM | POA: Diagnosis not present

## 2016-10-09 DIAGNOSIS — E1122 Type 2 diabetes mellitus with diabetic chronic kidney disease: Secondary | ICD-10-CM

## 2016-10-09 NOTE — Patient Outreach (Signed)
[facility]) Care Management  10/09/2016  RYONNA BORCK Nov 21, 1935 [id number]  Unsuccessful phone outreach to patient regarding patient assistance.  HIPAA compliant message left requesting a return call.    [facility] received a fax of patient's out-of-pocket prescription spend from her local pharmacy which showed she had not spent $1000 yet for 2017 which is the required amount for [employer] Patient Assistance program.   [facility] Pharmacist was provide with a letter from [place] patient assistance program which patient gave to [place] at recent home visit.    Letter appears to remind patient her eligibility for [place] assistance expires 11/18/16 and she will need to re-qualify for 2018.    Plan:  If no return call, will make another outreach attempt next week.   Karrie Meres, PharmD, [place] [phone]

## 2016-10-09 NOTE — Patient Instructions (Addendum)
Your physician recommends that you schedule a follow-up appointment in: 2-3 months   Please see Dr Luan Pulling first, before follow up with Dr Bronson Ing   Get chest x-ray now    Thank you for choosing [facility] !

## 2016-10-09 NOTE — Progress Notes (Signed)
SUBJECTIVE: The patient presents for routine cardiovascular followup. In summary, she has a history of chronic diastolic heart failure, hypertension, coronary artery disease with multiple previously placed stents, COPD and is on chronic oxygen at home (2L), sleep apnea and uses CPAP, hyperlipidemia, and insulin-dependent diabetes mellitus.   Echocardiogram on 04/15/2015 demonstrated vigorous left ventricular systolic function, EF [id number], severe LVH, grade 1 diastolic dysfunction, elevated filling pressures, trivial mitral regurgitation, trivial tricuspid regurgitation, and normal pulmonary pressures of 27 mmHg.   As per Dr. Evette Georges note on 02/14/14, she has coronary artery disease dating back to the 1990s and has undergone multiple stent procedures involving the proximal LAD, circumflex, mid right coronary artery, and distal right coronary artery. Her last catheterization in October 2013 did not reveal high-grade restenosis. She did have 80-90% ostial stenosis and a small jailed first septal perforating artery. She had mild calcification and 20-30% narrowing in the stent in the circumflex vessel. The right coronary artery had 3 stents and there was 40-50% in-stent smooth narrowing with 30% narrowing in the distal stent.   She was hospitalized for acute on chronic diastolic heart failure associated with a hypertensive crisis in December 2016.  She has chest pain about once a week and took one nitroglycerin last week. Her chronic exertional dyspnea has gotten worse over the past few months. She said her oxygen tank is too heavy. She has yet to see her pulmonologist.  ECG performed in the office today showed normal sinus rhythm with old anteroseptal infarct and a nonspecific T wave abnormality.   Review of Systems: As per "subjective", otherwise negative.  Allergies  Allergen Reactions  . Morphine Shortness Of Breath and Swelling  . Penicillins Shortness Of Breath and Swelling    Has  patient had a PCN reaction causing immediate rash, facial/tongue/throat swelling, SOB or lightheadedness with hypotension: No Has patient had a PCN reaction causing severe rash involving mucus membranes or skin necrosis: No Has patient had a PCN reaction that required hospitalization Yes Has patient had a PCN reaction occurring within the last 10 years: No If all of the above answers are "NO", then may proceed with Cephalosporin use.   . Shellfish Allergy     Current Outpatient Prescriptions  Medication Sig Dispense Refill  . aspirin 81 MG EC tablet Take 81 mg by mouth every morning.     . calcitRIOL (ROCALTROL) 0.25 MCG capsule Take 0.25 mcg by mouth daily. Reported on 03/30/2016    . clopidogrel (PLAVIX) 75 MG tablet TAKE 1 TABLET ONCE DAILY. 30 tablet 3  . cyclobenzaprine (FLEXERIL) 5 MG tablet Take 5 mg by mouth at bedtime.    . famotidine (PEPCID) 20 MG tablet Take 1 tablet (20 mg total) by mouth daily. 30 tablet 1  . febuxostat (ULORIC) 40 MG tablet Take 40 mg by mouth daily.    . ferrous sulfate 325 (65 FE) MG tablet Take 325 mg by mouth daily.     . Fluticasone-Salmeterol (ADVAIR DISKUS) 250-50 MCG/DOSE AEPB Inhale 1 puff into the lungs 2 (two) times daily. 60 each 1  . HYDROcodone-acetaminophen (NORCO/VICODIN) 5-325 MG tablet Take 1 tablet by mouth every 12 (twelve) hours as needed for moderate pain.    Marland Kitchen insulin aspart protamine - aspart (NOVOLOG 70/30 MIX) (70-30) 100 UNIT/ML FlexPen Inject 35-50 Units into the skin 2 (two) times daily before a meal. Inject 50 units just before  breakfast if blood glucose is above 90 and inject 40 units just before  supper  if blood glucose is above 90.    . Insulin Pen Needle (B-D ULTRAFINE III SHORT PEN) 31G X 8 MM MISC 1 each by Does not apply route as directed. 100 each 3  . isosorbide mononitrate (IMDUR) 60 MG 24 hr tablet TAKE 1 TABLET ONCE DAILY. 30 tablet 3  . lisinopril (PRINIVIL,ZESTRIL) 10 MG tablet TAKE 1 TABLET ONCE DAILY. 30 tablet 3  .  nitroGLYCERIN (NITROSTAT) 0.4 MG SL tablet Place 1 tablet (0.4 mg total) under the tongue every 5 (five) minutes as needed for chest pain. 25 tablet 3  . ONETOUCH VERIO test strip USE FOUR TIMES DAILY AS DIRECTED. 150 each 5  . polyethylene glycol (MIRALAX / GLYCOLAX) packet Take 17 g by mouth daily as needed for mild constipation. 30 each 0  . RANEXA 500 MG 12 hr tablet TAKE (1) TABLET TWICE DAILY. 60 tablet 6  . rosuvastatin (CRESTOR) 20 MG tablet Take 1 tablet (20 mg total) by mouth every evening. 30 tablet 1  . torsemide (DEMADEX) 20 MG tablet MAY TAKE EXTRA 20MG  DAILY FOR SWELLING. 30 tablet 6  . ZETIA 10 MG tablet TAKE 1 TABLET BY MOUTH EVERY MORNING. 30 tablet 6   No current facility-administered medications for this visit.     Past Medical History:  Diagnosis Date  . Allergic rhinitis   . CAD (coronary artery disease) 2009   5 stents- #3 in RCA, #1 each in LAD and AVG  . CKD (chronic kidney disease) stage 3, GFR 30-59 ml/min   . Complete heart block, transient 2014  . Diastolic dysfunction, left ventricle   . DJD (degenerative joint disease)   . Emphysema lung (HCC)    2L N/C continuously  . Essential hypertension   . GERD (gastroesophageal reflux disease)   . Glaucoma   . Gout   . History of uterine cancer   . HOH (hard of hearing)   . Hyperlipidemia   . MI (myocardial infarction) 1998  . Microcytic anemia    History of occult blood in stool  . Myalgia   . Osteoarthritis   . Palpitations   . Personal history of colonic polyps   . Sleep apnea    CPAP  . Type II diabetes mellitus with nephropathy [facility])     Past Surgical History:  Procedure Laterality Date  . ABDOMINAL HYSTERECTOMY  04/2010   Uterine cancer,TAHBSO  . CERVICAL DISCECTOMY     L5 left/hemilminectomy  . COLONOSCOPY  09/2006   Int hemmorhoids, COMPLICATED BY CARDIOPULMONARY COMPLICATIONS  . CORONARY ANGIOPLASTY  1/99, 1/07, 1/08, 4/09   5 cardiac stents total  . ECTOPIC PREGNANCY SURGERY    . FOOT  SURGERY     Left and right for callous  . LEFT AND RIGHT HEART CATHETERIZATION WITH CORONARY ANGIOGRAM N/A 09/12/2012   Procedure: LEFT AND RIGHT HEART CATHETERIZATION WITH CORONARY ANGIOGRAM;  Surgeon: Sanda Klein, MD;  Location: [facility] CATH LAB;  Service: Cardiovascular;  Laterality: N/A;  . POLYPECTOMY  10/22/2011   Internal hemorrhoids/sessile polyp    Social History   Social History  . Marital status: Widowed    Spouse name: N/A  . Number of children: 0  . Years of education: 12   Occupational History  .      retired   Social History Main Topics  . Smoking status: Former Smoker    Packs/day: 0.40    Years: 51.00    Types: Cigarettes    Start date: 04/22/1955    Quit date: 10/18/2006  .  Smokeless tobacco: Never Used     Comment: quit about 4 yrs ago  . Alcohol use No  . Drug use: No  . Sexual activity: No   Other Topics Concern  . Not on file   Social History Narrative   Consumes 2 cups of caffeine daily     Vitals:   10/09/16 1436  BP: 138/70  Pulse: 93  SpO2: 94%  Weight: 199 lb (90.3 kg)  Height: 5\' 7"  (1.702 m)    PHYSICAL EXAM General: NAD Neck: No JVD, no thyromegaly.  Lungs: Diminished but clear b/l. No rales or wheezes appreciated.  CV: Nondisplaced PMI. Regular rate and rhythm, normal S1/S2, no S3/S4, no murmur. Trivial periankle edema.  Abdomen: Soft, nontender, no distention.  Neurologic: Alert and oriented x 3.  Psych: Mildly flat affect.  Skin: Normal. Musculoskeletal: No gross deformities.    ECG: Most recent ECG reviewed.      ASSESSMENT AND PLAN: 1. Chronic diastolic heart failure: She is currently euvolemic and stable. Continue torsemide 20 mg. Instructed to take extra 20 mg for increased leg swelling.  2. CAD: Stable ischemic heart disease. She underwent a low risk, normal nuclear stress test in September 2014. Continue current therapy with aspirin, Plavix, Zetia, Imdur, Bystolic, Ranexa, and Crestor.  3. Essential  hypertension: Controlled. No changes.  4. Hyperlipidemia: Continue current therapy with Crestor and Zetia.   5. DOE: Will obtain a chest xray. May be worsening of COPD. Advised to see pulmonology.  Dispo: f/u 2-3 months    Kate Sable, M.D., F.A.C.C.

## 2016-10-25 ENCOUNTER — Other Ambulatory Visit: Payer: Self-pay | Admitting: Pharmacist

## 2016-10-25 NOTE — Patient Outreach (Signed)
[facility]) Care Management  10/25/2016  SHAMANI CACCIA 12-02-1935 [id number]  Second phone outreach to follow-up with patient regarding patient assistance.   She answered call and states she is busy and requests a return call later.    Plan:  Will attempt to reach patient again later per her request.   Karrie Meres, PharmD, [place] [phone]

## 2016-10-26 ENCOUNTER — Other Ambulatory Visit: Payer: Self-pay | Admitting: Pharmacist

## 2016-10-26 NOTE — Patient Outreach (Signed)
[facility]) Care Management  10/26/2016  Krystal [name] 22-Apr-1936 [id number]  Successful phone outreach to patient.   Patient verified HIPAA details.    Discussed with patient out-of-pocket spend from her pharmacy did not show she spent $1000 during calendar which is requirement for [employer] Patient Assistance for Medicare Part D beneficiaries.  She states she has insulin to last the rest of the year.   Counseled patient [employer] had mailed her a letter and application for her to re-apply for Advair once she meets requirements in 2018, which appears to include an out-of-pocket prescription spend. This is what she provided to [place] at last home visit.   Reminded patient her eligibility for [employer] patient assistance (Zetia) likely may only go through 11/18/16 and she will have to re-apply.    Patient was counseled that many manufacturer patient assistance programs evaluate Part D Beneficiaries on a calendar year basis and require patients to meet eligibility each year.  She verbalized understanding.   Patient denies other pharmacy related questions.   Plan:  At this time, will close pharmacy case.   Will mail her [employer] application back to her which she had [place] get to [place].   Patient is aware she can contact [place] with new needs or if/when she wishes to re-apply for manufacturer patient assistance.   Karrie Meres, PharmD, [place] [phone]

## 2016-10-29 ENCOUNTER — Encounter: Payer: Self-pay | Admitting: Pharmacist

## 2016-10-30 ENCOUNTER — Other Ambulatory Visit: Payer: Self-pay | Admitting: *Deleted

## 2016-10-30 NOTE — Patient Outreach (Signed)
Krystal [name]) Care Management  10/30/2016  Krystal [name] [birth year] [id number]   Krystal [name] is an 80 y.o. female with diabetes mellitus, nephropathy, peripheral neuropathy,dyslipidemia, hypertension, and PVD. Krystal [name] was originally referred to [facility] for transition of care services after a hospital admission in December/January for hypertensive crisis. Most recently [facility] has been assisting Krystal [name] with medication management and assistance and diabetes management and education.   Assessment:  80 year old patient with knowledge deficits related to DM self management.   Chronic Health Condition (DM)- Krystal [name] saw Dr. Dorris Fetch in the office on 09/25/16. Her labs showed improvement in HgA1c from 12.2% to 11.3%.   Dr. Dorris Fetch made several recommendations regarding Krystal [name] prescribed carb modified diet: We discussed those in previous visits and phone conversations and today discussed her strategy for managing carbohydrate modification during the holidays. KrystalAguirre and I discussed portion control and recognition of balance in her meals. We also discussed the importance of having protein at each meal and discussed examples of proteins that might be available to her during family gatherings.   Krystal [name] will continue to make dietary changes as outlined above/recommended by Dr. Dorris Fetch. She will take extra care to monitor portion control with particular attention to complex carbohydrates and desserts offered at holiday gatherings. Her strategy is to choose the ONE dessert item she likes best at a gathering and have a small portion only.   Krystal [name] is scheduled to see Jearld Fenton RD, CDE on 11/05/16 for dietary and diabetes counseling.   Medication Management/assistance  Our [facility] Care Management pharmacist Karrie Meres PharmD, BCACP has been assisting Krystal [name] with inquiry into manufacturer assistance programs. After  thorough evaluation and inquiry with programs, Dr. Jenna Luo concluded that Krystal [name] will not be eligible for medication assistance programs. Details are as follows:   [employer] assistance - not eligible; exceeds income and benefit availability requirement. She states she has enough insulin for the year.  Bernita Buffy - Krystal [name] has not returned the [place] application but says today she will have it ready for me when I am in her area later this week. I'll pick up and return to Dr. Genia Hotter.   Zetia/GSK - Dr. Jenna Luo has mailed a [employer] application back to her for 2018. Krystal [name] will need to reapply for Zetia in 2018 if she wishes.   As of today, Krystal [name] does need to meet any program requirements for 2018, which may or may not be published yet.     Plan:   I will see Krystal [name] at home in January. She has travel plans throughout the holiday season.   Krystal [name] will check cbg's daily and journal as outlined above, taking care to not miss cbg checks through the holiday season and during her travels.   I will follow up with Krystal [name] by phone after her visit w/ [name].    [facility]  [phone]

## 2016-11-05 ENCOUNTER — Encounter: Payer: Medicare Other | Attending: "Endocrinology | Admitting: Nutrition

## 2016-11-05 ENCOUNTER — Other Ambulatory Visit: Payer: Self-pay | Admitting: Cardiovascular Disease

## 2016-11-05 VITALS — Wt 201.0 lb

## 2016-11-05 DIAGNOSIS — E669 Obesity, unspecified: Secondary | ICD-10-CM

## 2016-11-05 DIAGNOSIS — E118 Type 2 diabetes mellitus with unspecified complications: Secondary | ICD-10-CM

## 2016-11-05 DIAGNOSIS — Z713 Dietary counseling and surveillance: Secondary | ICD-10-CM | POA: Diagnosis not present

## 2016-11-05 DIAGNOSIS — E119 Type 2 diabetes mellitus without complications: Secondary | ICD-10-CM | POA: Insufficient documentation

## 2016-11-05 DIAGNOSIS — Z794 Long term (current) use of insulin: Secondary | ICD-10-CM

## 2016-11-05 DIAGNOSIS — E1165 Type 2 diabetes mellitus with hyperglycemia: Secondary | ICD-10-CM

## 2016-11-05 DIAGNOSIS — IMO0002 Reserved for concepts with insufficient information to code with codable children: Secondary | ICD-10-CM

## 2016-11-05 NOTE — Patient Instructions (Signed)
Goals 1. Follow Plate Method 2. Increase fresh fruits and low carb vegetables with meals 3. Take 70/30  Insulin before breakfast and BEFORE DINNER daily as instructed; 50 units before breakfast and 40 units before dinner. 4. Keep drinking water 5. Don't skip meals. Get A1C down to 7%

## 2016-11-05 NOTE — Progress Notes (Signed)
  Medical Nutrition Therapy:  Appt start time: 1530end time:  1600.  Assessment:  Primary concerns today: Diabetes Type 2.  She reports taking 70/30  5O units in am and 40 units at night before bed instead of before dinner.. She notes she didn't know to take her evening insulin before dinner. BS meter brought in. BS 200's and had one 500 but she ate something she should have eaten she notes.     She is walking with a cane today. She reports that she has a pulled muscle in her back. She reports of getting dizzy at times when she stands up. Encouraged her to talk to her PCP about dizziness and her pulled back muscles.    Questionable cognitive function that may be interfering with her compliance of medications.. She reports she drove herself today. Has a CNA at home that helps her with some of her meals.    Diet is high in carbs when she drinks sodas. Eating three meals per day now. Drinking water better also. Needs more lower carb vegetables and fresh fruit with meals.     Lab Results  Component Value Date   HGBA1C 11.3 (H) 07/04/2016      Preferred Learning Style:   No preference indicated    Learning Readiness:   Ready  Change in progress  MEDICATIONS: See list   DIETARY INTAKE:  24-hr recall:  Egg and pancake,  Lunch taco 2, Pepsi, water Dinner:  Rice, chicken, onion and green peppers,  Water.  Usual physical activity: limited due to COPD    Estimated energy needs: 1500 -1800 calories 170 g carbohydrates 112 g protein 42 g fat  Progress Towards Goal(s):  In progress.   Nutritional Diagnosis:  NB-1.1 Food and nutrition-related knowledge deficit As related to DIabetes .  As evidenced by A1C > 11%..    Intervention:  Spend 30+ minutes going over her medications, which insulins to take when, testing and monitoring blood sugars, signs and symptoms and treatment of low blood sugars, dangers of taking insulin incorrectly and need for medication assistance of bubble packing  of medications and benefits of assisted living faclity or an aide in home to help manage medications and assist with medical care needs. Provided written instructions of insulin pens and what to take when and importance of testing before meals. Advised to call MD if BS are over 300 or go to ER.  Goals 1. Follow Plate Method 2. Increase fresh fruits and low carb vegetables with meals 3. Take 70/30  Insulin before breakfast and BEFORE DINNER daily as instructed; 50 units before breakfast and 40 units before dinner. 4. Keep drinking water 5. Don't skip meals. Get A1C down to 7%  Teaching Method Utilized:  Visual Auditory Hands on  Handouts given during visit include:  The Plate Method  Meal Plan Card  Diabetes instructions  Barriers to learning/adherence to lifestyle change: Breathing and mobility  Demonstrated degree of understanding via:  Teach Back   Monitoring/Evaluation:  Dietary intake, exercise, meal planning, SBG, and body weight in 1-2 months.Marland Kitchen

## 2016-11-06 ENCOUNTER — Other Ambulatory Visit: Payer: Self-pay | Admitting: *Deleted

## 2016-11-06 NOTE — Patient Outreach (Signed)
[facility]) Care Management  11/06/2016  AMBRI DA Aug 02, 1936 [id number]  I was unable to reach Mrs. Willeford by phone today to follow up on her diabetes management and particularly to see how her visit with the CDE/RD went. I left a HIPPA compliant message requesting a return call.   Plan: I will reach out to Mrs. Hauptman again by phone in the next few weeks.    [facility]  [phone]

## 2016-11-13 ENCOUNTER — Other Ambulatory Visit: Payer: Self-pay | Admitting: *Deleted

## 2016-11-13 NOTE — Patient Outreach (Signed)
[facility]) Care Management  11/13/2016  Krystal [name] 04-16-1936 [id number]   Krystal Curling Macklinis an 80 y.o.femalewith diabetes mellitus, nephropathy, peripheral neuropathy,dyslipidemia, hypertension, and PVD. Krystal [name] was originally referred to [facility] for transition of care services after a hospital admission in December/January for hypertensive crisis. Most recently [facility] has been assisting Krystal [name] with medication management and assistance and diabetes management and education.   Assessment: 80 year old patient with knowledge and self health management deficits related to DM.   Acute Health Condition (loose tooth) - Krystal [name] reports today that she noticed a loose right upper ("near front") tooth over the last few days. She says it doesn't hurt until she tries to eat something. She doesn't know how the tooth became loose. She couldn't recall the name of her dentist but said she'd last seen a female dentist in [place]. When I asked if she needed assistance making an appointment, she said she wouldn't have the money to pay her copay but didn't know what her copay would be.   I've reached out to Krystal [name]'s primary care provider office to see if they have record of her dental provider so I can help her reach out to get details about copay for office visit, etc....  Acute Health Condition (nasal congestion) - Krystal [name] says she thinks she has a "sinus infection" today; she reports nasal congestion and "clearing my throat a lot"; she denies subjective fever and says nasal secretions are clear; I told her I would let her primary care provider know when I called about dental provider (see below)  Chronic Health Condition (DM)- Krystal [name] saw Dr. Dorris Fetch in the office on 09/25/16. Her labs showed improvement in HgA1c from 12.2% to 11.3%.   Dr. Dorris Fetch made several recommendations regarding Krystal [name]'s prescribed carb modified  diet: Prior to the Christmas Holiday we discussed Dr. Liliane Channel recommendations and strategies for maintaining good control of her cbg's through the holiday gatherings.   Krystal [name] saw Jearld Fenton RD, CDE on 11/05/16 for dietary/diabetes counseling.   Krystal [name] and [name] "reviewed medications, which insulins to take when, testing and monitoring blood sugars, signs and symptoms and treatment of low blood sugars, dangers of taking insulin incorrectly and need for medication assistance of bubble packing of medications and benefits of assisted living faclity or an aide in home to help manage medications and assist with medical care needs. Provided written instructions of insulin pens and what to take when and importance of testing before meals. Advised to call MD if BS are over 300 or go to ER."   Medication Management/assistance  Our [facility] Care Management pharmacist Karrie Meres PharmD, BCACP has been assisting Krystal [name] with inquiry into manufacturer assistance programs. After thorough evaluation and inquiry with programs, Dr. Jenna Luo concluded that Krystal [name] does NOT meet any program requirements for 2018, including Novo Norsik, [place], or Zetia/GSK, because her income and benefit availability exceeds requirements for all available programs.   Plan:   I will see Krystal [name] at home in January. She has travel plans throughout the holiday season.   Krystal [name] will check cbg's daily and journal as outlined above, taking care to not miss cbg checks through the holiday season and during her travels.   I will follow up with Krystal [name] regarding need for dental appointment after I hear back from her primary care provider office.    [facility]  [phone]

## 2016-11-16 DIAGNOSIS — Z79899 Other long term (current) drug therapy: Secondary | ICD-10-CM | POA: Diagnosis not present

## 2016-11-16 DIAGNOSIS — N183 Chronic kidney disease, stage 3 (moderate): Secondary | ICD-10-CM | POA: Diagnosis not present

## 2016-11-16 DIAGNOSIS — D509 Iron deficiency anemia, unspecified: Secondary | ICD-10-CM | POA: Diagnosis not present

## 2016-11-16 DIAGNOSIS — R809 Proteinuria, unspecified: Secondary | ICD-10-CM | POA: Diagnosis not present

## 2016-11-16 DIAGNOSIS — E559 Vitamin D deficiency, unspecified: Secondary | ICD-10-CM | POA: Diagnosis not present

## 2016-11-16 DIAGNOSIS — I10 Essential (primary) hypertension: Secondary | ICD-10-CM | POA: Diagnosis not present

## 2016-11-21 DIAGNOSIS — N2581 Secondary hyperparathyroidism of renal origin: Secondary | ICD-10-CM | POA: Diagnosis not present

## 2016-11-21 DIAGNOSIS — E1165 Type 2 diabetes mellitus with hyperglycemia: Secondary | ICD-10-CM | POA: Diagnosis not present

## 2016-11-21 DIAGNOSIS — I10 Essential (primary) hypertension: Secondary | ICD-10-CM | POA: Diagnosis not present

## 2016-11-21 DIAGNOSIS — J449 Chronic obstructive pulmonary disease, unspecified: Secondary | ICD-10-CM | POA: Diagnosis not present

## 2016-11-21 DIAGNOSIS — N184 Chronic kidney disease, stage 4 (severe): Secondary | ICD-10-CM | POA: Diagnosis not present

## 2016-11-21 DIAGNOSIS — R809 Proteinuria, unspecified: Secondary | ICD-10-CM | POA: Diagnosis not present

## 2016-11-21 DIAGNOSIS — J9611 Chronic respiratory failure with hypoxia: Secondary | ICD-10-CM | POA: Diagnosis not present

## 2016-11-21 DIAGNOSIS — D638 Anemia in other chronic diseases classified elsewhere: Secondary | ICD-10-CM | POA: Diagnosis not present

## 2016-11-22 ENCOUNTER — Other Ambulatory Visit: Payer: Self-pay

## 2016-11-22 MED ORDER — GLUCOSE BLOOD VI STRP: ORAL_STRIP | 2 refills | Status: DC

## 2016-11-22 MED ORDER — ACCU-CHEK SOFT TOUCH LANCETS MISC
2 refills | Status: DC
Start: 2016-11-22 — End: 2017-12-20

## 2016-11-22 MED ORDER — ACCU-CHEK AVIVA DEVI: 0 refills | Status: DC

## 2016-11-26 ENCOUNTER — Telehealth: Payer: Self-pay | Admitting: "Endocrinology

## 2016-11-26 NOTE — Telephone Encounter (Signed)
Pt needs a One touch verio. She will send someone to office to pick one up that we have here.

## 2016-11-26 NOTE — Telephone Encounter (Signed)
Patient left a message stating her meter has not worked for three days and she needs to know how to get a new one. Please call.

## 2016-11-27 ENCOUNTER — Ambulatory Visit: Payer: Self-pay | Admitting: *Deleted

## 2016-11-28 ENCOUNTER — Other Ambulatory Visit: Payer: Self-pay | Admitting: *Deleted

## 2016-11-28 ENCOUNTER — Encounter: Payer: Self-pay | Admitting: *Deleted

## 2016-12-04 ENCOUNTER — Other Ambulatory Visit: Payer: Self-pay | Admitting: Cardiovascular Disease

## 2016-12-04 ENCOUNTER — Other Ambulatory Visit: Payer: Self-pay | Admitting: "Endocrinology

## 2016-12-05 ENCOUNTER — Ambulatory Visit: Payer: Medicare Other | Admitting: Nutrition

## 2016-12-06 ENCOUNTER — Other Ambulatory Visit: Payer: Self-pay | Admitting: *Deleted

## 2016-12-06 NOTE — Patient Outreach (Signed)
  [facility]) Care Management  12/06/2016  ILEYAH NAPOLITAN 1936/10/15 [id number]  Mindi Curling Macklinis an 81 y.o.femalewith diabetes mellitus, nephropathy, peripheral neuropathy,dyslipidemia, hypertension, and PVD. Mrs. Schillo was originally referred to [facility] for transition of care services after a hospital admission in December/January for hypertensive crisis. Most recently [facility] has been assisting Mrs. Spikes with medication management and assistance and diabetes management and education. I visited Mrs. Kassner at home last week briefly at her request while I was in the area as she had asked for a [place] [facility] notebook she could use for documentation of her cbg's. We visited briefly and then by phone for follow up.   Assessment: 81 year old patient with knowledge and self health management deficits related to DM.   Acute Health Condition (nasal congestion) Resolved - Mrs. Herandez complained of nasal congestion last month which she says is now resolved.   Chronic Health Condition (DM)- Mrs. Ethridge saw Dr. Dorris Fetch in the office on 09/25/16. Her labs showed improvement in HgA1c from 12.2% to 11.3%. She is scheduled to see Jearld Fenton RD, CDE on 12/13/16 for ongoing diabetes education.   Dr. Dorris Fetch made several recommendations regarding Mrs. Amaro's prescribed carb modified diet which we have reviewed in detail on each occasion I've seen Mrs. Ades or spoken with her.   Medication Management/assistance Our [facility] Care Management pharmacist Karrie Meres PharmD, BCACP has been assisting Mrs. Gemmer with inquiry into manufacturer assistance programs. After thorough evaluation and inquiry with programs, Dr. Jenna Luo concluded that Mrs. Solomons does NOT meet any program requirements for 2018, including Novo Norsik, [place], or Zetia/GSK, because her income and benefit availability exceeds requirements for all available programs.    Plan: I will follow up with Mrs. Pruett by phone in February to review her progress and address any case management/care coordination needs.    [facility]  [phone]

## 2016-12-06 NOTE — Telephone Encounter (Signed)
This encounter was created in error - please disregard.

## 2016-12-11 DIAGNOSIS — E1122 Type 2 diabetes mellitus with diabetic chronic kidney disease: Secondary | ICD-10-CM | POA: Diagnosis not present

## 2016-12-11 DIAGNOSIS — I110 Hypertensive heart disease with heart failure: Secondary | ICD-10-CM | POA: Diagnosis not present

## 2016-12-11 DIAGNOSIS — I10 Essential (primary) hypertension: Secondary | ICD-10-CM | POA: Diagnosis not present

## 2016-12-11 DIAGNOSIS — N183 Chronic kidney disease, stage 3 (moderate): Secondary | ICD-10-CM | POA: Diagnosis not present

## 2016-12-13 ENCOUNTER — Ambulatory Visit: Payer: Medicare Other | Admitting: Nutrition

## 2016-12-18 DIAGNOSIS — E1042 Type 1 diabetes mellitus with diabetic polyneuropathy: Secondary | ICD-10-CM | POA: Diagnosis not present

## 2016-12-18 DIAGNOSIS — L603 Nail dystrophy: Secondary | ICD-10-CM | POA: Diagnosis not present

## 2016-12-18 DIAGNOSIS — I739 Peripheral vascular disease, unspecified: Secondary | ICD-10-CM | POA: Diagnosis not present

## 2016-12-24 ENCOUNTER — Other Ambulatory Visit: Payer: Self-pay | Admitting: *Deleted

## 2016-12-24 NOTE — Patient Outreach (Signed)
[facility]) Care Management  12/24/2016  ORIANNA ROURK [birth year] [id number]  I was unable to reach Mrs. Perritt by phone when I reached out to her this afternoon. I left a HIPPA compliant voice message requesting a return call.   Plan: I will try to make contact with Mrs. Blakes again next week if I do not hear from her.    [place] Management  [phone]

## 2016-12-26 ENCOUNTER — Other Ambulatory Visit: Payer: Self-pay | Admitting: "Endocrinology

## 2016-12-27 ENCOUNTER — Ambulatory Visit: Payer: Medicare Other | Admitting: Cardiovascular Disease

## 2017-01-01 ENCOUNTER — Ambulatory Visit: Payer: Medicare Other | Admitting: "Endocrinology

## 2017-01-02 ENCOUNTER — Other Ambulatory Visit: Payer: Self-pay | Admitting: *Deleted

## 2017-01-04 ENCOUNTER — Encounter: Payer: Self-pay | Admitting: *Deleted

## 2017-01-04 ENCOUNTER — Other Ambulatory Visit: Payer: Self-pay | Admitting: *Deleted

## 2017-01-04 NOTE — Patient Outreach (Signed)
[facility]) Care Management  01/04/2017  Krystal [name] Apr 29, 1936 [id number]  I have been unable to reach [name] on several attempts by phone since my home visit with her on 12/06/16. I have left messages requesting return calls but have not heard from her.   Today, I sent a letter to [name] inquiring as to whether she is still interested in engaging with [facility] for care management services. If I have not heard from her in 10 days, I will close her case. We are happy to engage with [name] at any time should she be in need of case management services.   Plan: Follow up in 10 days.    [place] [facility]  [phone]

## 2017-01-15 DIAGNOSIS — J9611 Chronic respiratory failure with hypoxia: Secondary | ICD-10-CM | POA: Diagnosis not present

## 2017-01-15 DIAGNOSIS — J449 Chronic obstructive pulmonary disease, unspecified: Secondary | ICD-10-CM | POA: Diagnosis not present

## 2017-01-15 DIAGNOSIS — G4733 Obstructive sleep apnea (adult) (pediatric): Secondary | ICD-10-CM | POA: Diagnosis not present

## 2017-01-15 DIAGNOSIS — I10 Essential (primary) hypertension: Secondary | ICD-10-CM | POA: Diagnosis not present

## 2017-01-16 ENCOUNTER — Ambulatory Visit: Payer: Self-pay | Admitting: *Deleted

## 2017-01-18 ENCOUNTER — Ambulatory Visit (INDEPENDENT_AMBULATORY_CARE_PROVIDER_SITE_OTHER): Payer: Medicare Other | Admitting: Cardiovascular Disease

## 2017-01-18 ENCOUNTER — Encounter: Payer: Self-pay | Admitting: Cardiovascular Disease

## 2017-01-18 ENCOUNTER — Other Ambulatory Visit: Payer: Self-pay | Admitting: *Deleted

## 2017-01-18 VITALS — BP 160/90 | HR 99 | Ht 67.0 in | Wt 198.0 lb

## 2017-01-18 DIAGNOSIS — J438 Other emphysema: Secondary | ICD-10-CM | POA: Diagnosis not present

## 2017-01-18 DIAGNOSIS — I5032 Chronic diastolic (congestive) heart failure: Secondary | ICD-10-CM | POA: Diagnosis not present

## 2017-01-18 DIAGNOSIS — I10 Essential (primary) hypertension: Secondary | ICD-10-CM

## 2017-01-18 DIAGNOSIS — I208 Other forms of angina pectoris: Secondary | ICD-10-CM

## 2017-01-18 DIAGNOSIS — I25118 Atherosclerotic heart disease of native coronary artery with other forms of angina pectoris: Secondary | ICD-10-CM

## 2017-01-18 DIAGNOSIS — I209 Angina pectoris, unspecified: Secondary | ICD-10-CM

## 2017-01-18 DIAGNOSIS — E7800 Pure hypercholesterolemia, unspecified: Secondary | ICD-10-CM

## 2017-01-18 MED ORDER — RANOLAZINE ER 1000 MG PO TB12: 1000.0000 mg | ORAL_TABLET | Freq: Two times a day (BID) | ORAL | 6 refills | Status: DC

## 2017-01-18 MED ORDER — LISINOPRIL 20 MG PO TABS: 20.0000 mg | ORAL_TABLET | Freq: Every day | ORAL | 3 refills | Status: DC

## 2017-01-18 NOTE — Progress Notes (Signed)
SUBJECTIVE: The patient presents for routine cardiovascular followup. In summary, she has a history of chronic diastolic heart failure, hypertension, coronary artery disease with multiple previously placed stents, COPD and is on chronic oxygen at home (2L), sleep apnea and uses CPAP, hyperlipidemia, and insulin-dependent diabetes mellitus.   Echocardiogram on 04/15/2015 demonstrated vigorous left ventricular systolic function, EF [id number], severe LVH, grade 1 diastolic dysfunction, elevated filling pressures, trivial mitral regurgitation, trivial tricuspid regurgitation, and normal pulmonary pressures of 27 mmHg.   As per Dr. Evette Georges note on 02/14/14, she has coronary artery disease dating back to the 1990s and has undergone multiple stent procedures involving the proximal LAD, circumflex, mid right coronary artery, and distal right coronary artery. Her last catheterization in October 2013 did not reveal high-grade restenosis. She did have 80-90% ostial stenosis and a small jailed first septal perforating artery. She had mild calcification and 20-30% narrowing in the stent in the circumflex vessel. The right coronary artery had 3 stents and there was 40-50% in-stent smooth narrowing with 30% narrowing in the distal stent.   She has yet to respond to [facility] for assistance with management.  Chest x-ray 10/09/16 showed no active cardiopulmonary disease.  She has been having more frequent chest pain in the last 3 or 4 days associated with a headache. There has been radiation on the left arm. Last night she took one nitroglycerin tablet with relief and slept. She has had some episodes of dizziness. Walking to the office today, she had to stop 2 or 3 times. She does not have portable oxygen.   Review of Systems: As per "subjective", otherwise negative.  Allergies  Allergen Reactions  . Morphine Shortness Of Breath and Swelling  . Penicillins Shortness Of Breath and Swelling    Has patient had a  PCN reaction causing immediate rash, facial/tongue/throat swelling, SOB or lightheadedness with hypotension: No Has patient had a PCN reaction causing severe rash involving mucus membranes or skin necrosis: No Has patient had a PCN reaction that required hospitalization Yes Has patient had a PCN reaction occurring within the last 10 years: No If all of the above answers are "NO", then may proceed with Cephalosporin use.   . Shellfish Allergy     Current Outpatient Prescriptions  Medication Sig Dispense Refill  . aspirin 81 MG EC tablet Take 81 mg by mouth every morning.     . Blood Glucose Monitoring Suppl (ACCU-CHEK AVIVA) device Use as instructed bid. E11.65 1 each 0  . calcitRIOL (ROCALTROL) 0.25 MCG capsule Take 0.25 mcg by mouth daily. Reported on 03/30/2016    . clopidogrel (PLAVIX) 75 MG tablet TAKE 1 TABLET ONCE DAILY. 30 tablet 3  . cyclobenzaprine (FLEXERIL) 5 MG tablet Take 5 mg by mouth at bedtime.    . famotidine (PEPCID) 20 MG tablet Take 1 tablet (20 mg total) by mouth daily. 30 tablet 1  . febuxostat (ULORIC) 40 MG tablet Take 40 mg by mouth daily.    . ferrous sulfate 325 (65 FE) MG tablet Take 325 mg by mouth daily.     . Fluticasone-Salmeterol (ADVAIR DISKUS) 250-50 MCG/DOSE AEPB Inhale 1 puff into the lungs 2 (two) times daily. 60 each 1  . glucose blood (ACCU-CHEK AVIVA) test strip Use as instructed bid. E11.65 100 each 2  . HYDROcodone-acetaminophen (NORCO/VICODIN) 5-325 MG tablet Take 1 tablet by mouth every 12 (twelve) hours as needed for moderate pain.    Marland Kitchen insulin aspart protamine - aspart (NOVOLOG 70/30 MIX) (70-30)  100 UNIT/ML FlexPen Inject 35-50 Units into the skin 2 (two) times daily before a meal. Inject 50 units just before  breakfast if blood glucose is above 90 and inject 40 units just before  supper if blood glucose is above 90.    . Insulin Pen Needle (B-D ULTRAFINE III SHORT PEN) 31G X 8 MM MISC 1 each by Does not apply route as directed. 100 each 3  .  isosorbide mononitrate (IMDUR) 60 MG 24 hr tablet TAKE 1 TABLET ONCE DAILY. 30 tablet 3  . Lancets (ACCU-CHEK SOFT TOUCH) lancets Use as instructed bid. E11.65. 100 each 2  . levofloxacin (LEVAQUIN) 500 MG tablet Take 500 mg by mouth daily.    Marland Kitchen lisinopril (PRINIVIL,ZESTRIL) 10 MG tablet TAKE 1 TABLET ONCE DAILY. 30 tablet 6  . methylPREDNISolone (MEDROL) 4 MG tablet Take 4 mg by mouth daily.    . nitroGLYCERIN (NITROSTAT) 0.4 MG SL tablet Place 1 tablet (0.4 mg total) under the tongue every 5 (five) minutes as needed for chest pain. 25 tablet 3  . NOVOLOG MIX 70/30 FLEXPEN (70-30) 100 UNIT/ML FlexPen INJECT 50 UNITS IN THE MORNING AND 40 UNITS IN THE EVENING 30 mL 2  . ONETOUCH VERIO test strip USE FOUR TIMES DAILY AS DIRECTED. 150 each 5  . polyethylene glycol (MIRALAX / GLYCOLAX) packet Take 17 g by mouth daily as needed for mild constipation. 30 each 0  . RANEXA 500 MG 12 hr tablet TAKE (1) TABLET TWICE DAILY. 60 tablet 6  . rosuvastatin (CRESTOR) 20 MG tablet Take 1 tablet (20 mg total) by mouth every evening. 30 tablet 1  . torsemide (DEMADEX) 20 MG tablet MAY TAKE EXTRA 20MG  DAILY FOR SWELLING. 30 tablet 6  . ZETIA 10 MG tablet TAKE 1 TABLET BY MOUTH EVERY MORNING. 30 tablet 6   No current facility-administered medications for this visit.     Past Medical History:  Diagnosis Date  . Allergic rhinitis   . CAD (coronary artery disease) 2009   5 stents- #3 in RCA, #1 each in LAD and AVG  . CKD (chronic kidney disease) stage 3, GFR 30-59 ml/min   . Complete heart block, transient 2014  . Diastolic dysfunction, left ventricle   . DJD (degenerative joint disease)   . Emphysema lung (HCC)    2L N/C continuously  . Essential hypertension   . GERD (gastroesophageal reflux disease)   . Glaucoma   . Gout   . History of uterine cancer   . HOH (hard of hearing)   . Hyperlipidemia   . MI (myocardial infarction) 1998  . Microcytic anemia    History of occult blood in stool  . Myalgia     . Osteoarthritis   . Palpitations   . Personal history of colonic polyps   . Sleep apnea    CPAP  . Type II diabetes mellitus with nephropathy [facility])     Past Surgical History:  Procedure Laterality Date  . ABDOMINAL HYSTERECTOMY  04/2010   Uterine cancer,TAHBSO  . CERVICAL DISCECTOMY     L5 left/hemilminectomy  . COLONOSCOPY  09/2006   Int hemmorhoids, COMPLICATED BY CARDIOPULMONARY COMPLICATIONS  . CORONARY ANGIOPLASTY  1/99, 1/07, 1/08, 4/09   5 cardiac stents total  . ECTOPIC PREGNANCY SURGERY    . FOOT SURGERY     Left and right for callous  . LEFT AND RIGHT HEART CATHETERIZATION WITH CORONARY ANGIOGRAM N/A 09/12/2012   Procedure: LEFT AND RIGHT HEART CATHETERIZATION WITH CORONARY ANGIOGRAM;  Surgeon: Dani Gobble  Croitoru, MD;  Location: [facility] CATH [facility];  Service: Cardiovascular;  Laterality: N/A;  . POLYPECTOMY  10/22/2011   Internal hemorrhoids/sessile polyp    Social History   Social History  . Marital status: Widowed    Spouse name: N/A  . Number of children: 0  . Years of education: 12   Occupational History  .      retired   Social History Main Topics  . Smoking status: Former Smoker    Packs/day: 0.40    Years: 51.00    Types: Cigarettes    Start date: 04/22/1955    Quit date: 10/18/2006  . Smokeless tobacco: Never Used     Comment: quit about 4 yrs ago  . Alcohol use No  . Drug use: No  . Sexual activity: No   Other Topics Concern  . Not on file   Social History Narrative   Consumes 2 cups of caffeine daily     Vitals:   01/18/17 1316  BP: (!) 160/90  Pulse: 99  SpO2: 95%  Weight: 198 lb (89.8 kg)  Height: 5\' 7"  (1.702 m)    PHYSICAL EXAM General: NAD Neck: No JVD, no thyromegaly.  Lungs: Diminished but clear b/l. No rales or wheezes appreciated.  CV: Nondisplaced PMI. Regular rate and rhythm, normal S1/S2, no S3/S4, no murmur. Trivial periankle edema.  Abdomen: Soft, nontender, no distention.  Neurologic: Alert and oriented x 3.   Psych: Mildly flat affect.  Skin: Normal. Musculoskeletal: No gross deformities.    ECG: Most recent ECG reviewed.      ASSESSMENT AND PLAN: 1. Chronic diastolic heart failure: She is currently euvolemic and stable. Continue torsemide 20 mg. Instructed to take extra 20 mg for increased leg swelling.  2. CAD with chest pain: Will aim to control BP by increasing lisinopril. Will increase Ranexa to 1000 mg bid. She underwent a low risk, normal nuclear stress test in September 2014. Continue current therapy with aspirin, Plavix, Zetia, Imdur, and Crestor. Unclear why she is no longer on Bystolic.  3. Essential hypertension: Markedly elevated. Will increase lisinopril to 20 mg daily. Unclear why she is no longer on Bystolic.  4. Hyperlipidemia: Continue current therapy with Crestor and Zetia.    Dispo: f/u 2 months      Kate Sable, M.D., F.A.C.C.

## 2017-01-18 NOTE — Patient Outreach (Signed)
[place] [facility]) Care Management  01/18/2017  Krystal [name] 1936-10-06 [id number]  Call received from [name] who responded to a letter I sent 2 weeks after not being able to maintain contact with her. She says she is still interested in [place] Management services, particularly for DM Disease Management.   Plan: I will outreach to [name] next week and schedule a home visit with her.    [place] Management  [phone]

## 2017-01-18 NOTE — Patient Instructions (Signed)
Your physician recommends that you schedule a follow-up appointment in: 2 months    INCREASE Lisinopril to 20 mg daily   INCREASE Ranexa to 1,000 mg twice a day     Thank you for choosing [facility] !

## 2017-01-21 ENCOUNTER — Other Ambulatory Visit: Payer: Self-pay | Admitting: *Deleted

## 2017-01-21 NOTE — Patient Outreach (Signed)
[facility]) Care Management  01/21/2017  Krystal [name] 1936/06/04 [id number]  I called [name] today to confirm a home visit for Thursday at 11:00 am per her request.    Janalyn Shy [facility] [place] Management  [phone]

## 2017-01-24 ENCOUNTER — Other Ambulatory Visit: Payer: Self-pay | Admitting: "Endocrinology

## 2017-01-24 ENCOUNTER — Other Ambulatory Visit: Payer: Self-pay | Admitting: *Deleted

## 2017-01-24 DIAGNOSIS — Z794 Long term (current) use of insulin: Secondary | ICD-10-CM | POA: Diagnosis not present

## 2017-01-24 DIAGNOSIS — N184 Chronic kidney disease, stage 4 (severe): Secondary | ICD-10-CM | POA: Diagnosis not present

## 2017-01-24 DIAGNOSIS — E1122 Type 2 diabetes mellitus with diabetic chronic kidney disease: Secondary | ICD-10-CM | POA: Diagnosis not present

## 2017-01-24 NOTE — Patient Outreach (Addendum)
[place] [facility]) Care Management  01/24/2017  Krystal [name] 04-04-1936 [id number]  I was scheduled to see Krystal [name] at home today to resume our home visits for assistance with management of DM. However, when I called to remind her I was on my way for our visit (as per her request), she asked if we could reschedule because she also has a physician appointment today.   Krystal [name] says she feels she is doing fairly well with her DM management but knows she still needs to make some changes and asked if we could continue working together towards her goals.   Plan: I will see Krystal [name] at home next week on Tuesday for our DM visit.   [facility] CM Care Plan Problem One   Flowsheet Row Most Recent Value  Care Plan Problem One  Knowledge deficit related to diabetes as evidenced by patient questions around diabetes  Role Documenting the Problem One  Care Management [place] for Problem One  Active  [facility]N Long Term Goal (31-90 days)  Patient will verbalize understanding of diabetes management over the next 60 days  [facility]N Long Term Goal Start Date  01/24/17  Interventions for Problem One Long Term Goal  discussed current plan of care,  reviewed upcoming appointment with CDE/RD,  reviewed MD recommendations  [facility] CM Short Term Goal #1 (0-30 days)  Over the next 30 days, patient will verbalize understanding of which medications are used to treat her diabetes  [facility] CM Short Term Goal #1 Start Date  01/24/17  Interventions for Short Term Goal #1  Medication review planned for next week's home visit  [facility] CM Short Term Goal #2 (0-30 days)  Over the next 30 days, patient will check cbg's daily and record as prescribed  [facility] CM Short Term Goal #2 Start Date  01/24/17  Interventions for Short Term Goal #2  Reviewed prescribed plan to check cbg's daily,  plan review on next week's home visit  [facility]N CM Short Term Goal #3 (0-30 days)  over the next 30 days, patient will verbalize  understanding of basic components of prescribed carb modified diet  [facility]N CM Short Term Goal #3 Start Date  01/24/17  Interventions for Short Tern Goal #3  discussed carb modified diet,  prescribed [facility] educaitonal materials,  planned detailed review for next week's home visit      [place] Management  [phone]

## 2017-01-25 LAB — HEMOGLOBIN A1C
Hgb A1c MFr Bld: 10.4 % — ABNORMAL HIGH (ref <5.7)
MEAN PLASMA GLUCOSE: 252 mg/dL

## 2017-01-25 LAB — COMPREHENSIVE METABOLIC PANEL
ALK PHOS: 69 U/L (ref 33–130)
ALT: 11 U/L (ref 6–29)
AST: 15 U/L (ref 10–35)
Albumin: 3.8 g/dL (ref 3.6–5.1)
BILIRUBIN TOTAL: 0.3 mg/dL (ref 0.2–1.2)
BUN: 30 mg/dL — ABNORMAL HIGH (ref 7–25)
CO2: 30 mmol/L (ref 20–31)
CREATININE: 1.99 mg/dL — AB (ref 0.60–0.93)
Calcium: 9.4 mg/dL (ref 8.6–10.4)
Chloride: 99 mmol/L (ref 98–110)
Glucose, Bld: 201 mg/dL — ABNORMAL HIGH (ref 65–99)
Potassium: 3.8 mmol/L (ref 3.5–5.3)
SODIUM: 141 mmol/L (ref 135–146)
Total Protein: 6.6 g/dL (ref 6.1–8.1)

## 2017-01-29 ENCOUNTER — Other Ambulatory Visit: Payer: Self-pay | Admitting: "Endocrinology

## 2017-01-29 ENCOUNTER — Other Ambulatory Visit: Payer: Self-pay | Admitting: *Deleted

## 2017-01-29 NOTE — Patient Outreach (Addendum)
[facility]) Care Management   01/29/2017  Krystal [name] [birth year] [id number]  Krystal [name] is an 81 y.o. female  with diabetes mellitus, nephropathy, peripheral neuropathy,dyslipidemia, hypertension, and PVD. Krystal [name] was originally referred to [facility] for transition of care services after a hospital admission in December/January for hypertensive crisis. Most recently [place] Management has been assisting Krystal [name] with medication management and assistance and diabetes management and education.   Krystal [name] has been scheduled to be seen at home but there were scheduling conflicts since 67/6720 until today.  She was available for telephone assessments monthly since 10/2016 mainly.  She has had a change in aides since 2017.    Subjective:  "I feel okay.  I had some teeth removed recently"  Objective:   BP 142/74 pulse 84 O2 sat 99%   Review of Systems  Constitutional: Negative.   HENT: Negative.   Eyes: Negative.   Respiratory: Positive for cough. Negative for hemoptysis, sputum production, shortness of breath and wheezing.   Cardiovascular: Negative.   Gastrointestinal: Negative.   Genitourinary: Negative.   Musculoskeletal: Negative.   Skin: Negative.   Neurological: Negative.   Endo/Heme/Allergies: Negative.   Psychiatric/Behavioral: Negative.     Physical Exam  Constitutional: She is oriented to person, place, and time. She appears well-developed and well-nourished.  HENT:  Head: Normocephalic and atraumatic.  Eyes: Pupils are equal, round, and reactive to light.  Neck: Normal range of motion. Neck supple.  Cardiovascular: Normal rate and regular rhythm.   Respiratory: Effort normal.  Pt with use of oxygen nasal cannually  GI: Soft. Bowel sounds are normal.  Musculoskeletal: Normal range of motion.  Pt using quad cane to ambulate throughout her home  Neurological: She is alert and oriented to person, place, and time.   Skin: Skin is warm and dry.  Psychiatric: She has a normal mood and affect. Her behavior is normal. Judgment and thought content normal.    Encounter Medications:   Outpatient Encounter Prescriptions as of 01/29/2017  Medication Sig Note  . Blood Glucose Monitoring Suppl (ACCU-CHEK AVIVA) device Use as instructed bid. E11.65   . aspirin 81 MG EC tablet Take 81 mg by mouth every morning.    . calcitRIOL (ROCALTROL) 0.25 MCG capsule Take 0.25 mcg by mouth daily. Reported on 03/30/2016   . clopidogrel (PLAVIX) 75 MG tablet TAKE 1 TABLET ONCE DAILY.   . cyclobenzaprine (FLEXERIL) 5 MG tablet Take 5 mg by mouth at bedtime.   . famotidine (PEPCID) 20 MG tablet Take 1 tablet (20 mg total) by mouth daily.   . febuxostat (ULORIC) 40 MG tablet Take 40 mg by mouth daily. 09/06/2016: Requested transition from bottle to blister pack beginning of November; confirmed with pharmacist @ [facility] 09/06/2016   . ferrous sulfate 325 (65 FE) MG tablet Take 325 mg by mouth daily.  09/06/2016: Requested transition from bottle to blister pack beginning of November; confirmed with pharmacist @ [facility] 09/06/2016  . Fluticasone-Salmeterol (ADVAIR DISKUS) 250-50 MCG/DOSE AEPB Inhale 1 puff into the lungs 2 (two) times daily.   Marland Kitchen HYDROcodone-acetaminophen (NORCO/VICODIN) 5-325 MG tablet Take 1 tablet by mouth every 12 (twelve) hours as needed for moderate pain.   Marland Kitchen insulin aspart protamine - aspart (NOVOLOG 70/30 MIX) (70-30) 100 UNIT/ML FlexPen Inject 35-50 Units into the skin 2 (two) times daily before a meal. Inject 50 units just before  breakfast if blood glucose is above 90 and inject 40 units just before  supper if blood glucose is above 90. 11/28/2016: Patient is now taking 50u qam and 40u qpm  . Insulin Pen Needle (B-D ULTRAFINE III SHORT PEN) 31G X 8 MM MISC 1 each by Does not apply route as directed.   . isosorbide mononitrate (IMDUR) 60 MG 24 hr tablet TAKE 1 TABLET ONCE DAILY.   Marland Kitchen Lancets (ACCU-CHEK  SOFT TOUCH) lancets Use as instructed bid. E11.65.   Marland Kitchen levofloxacin (LEVAQUIN) 500 MG tablet Take 500 mg by mouth daily.   Marland Kitchen lisinopril (PRINIVIL,ZESTRIL) 20 MG tablet Take 1 tablet (20 mg total) by mouth daily.   . methylPREDNISolone (MEDROL) 4 MG tablet Take 4 mg by mouth daily. 11/28/2016: Completed on 11/27/16  . nitroGLYCERIN (NITROSTAT) 0.4 MG SL tablet Place 1 tablet (0.4 mg total) under the tongue every 5 (five) minutes as needed for chest pain. 04/30/2016: Has on hand if needed.   Marland Kitchen NOVOLOG MIX 70/30 FLEXPEN (70-30) 100 UNIT/ML FlexPen INJECT 50 UNITS IN THE MORNING AND 40 UNITS IN THE EVENING   . ONETOUCH VERIO test strip USE FOUR TIMES DAILY AS DIRECTED.   Marland Kitchen polyethylene glycol (MIRALAX / GLYCOLAX) packet Take 17 g by mouth daily as needed for mild constipation. 04/30/2016: Takes if needed  . ranolazine (RANEXA) 1000 MG SR tablet Take 1 tablet (1,000 mg total) by mouth 2 (two) times daily.   . rosuvastatin (CRESTOR) 20 MG tablet Take 1 tablet (20 mg total) by mouth every evening.   . torsemide (DEMADEX) 20 MG tablet MAY TAKE EXTRA 20MG  DAILY FOR SWELLING.   . ZETIA 10 MG tablet TAKE 1 TABLET BY MOUTH EVERY MORNING.   . [DISCONTINUED] glucose blood (ACCU-CHEK AVIVA) test strip Use as instructed bid. E11.65    No facility-administered encounter medications on file as of 01/29/2017.       Assessment:    Mobility:  Pt with damaged quad cane (one of four prongs without base)  Options of available quad cane pictures and cost ($22+) reviewed.  Discuss cost of purchasing DME via home health agency versus DME supply or drug store. Recommended she contact [facility] for options of canes and possible delivery to home.    Medication Management:  Reviewed pt's blister packs from [facility]. Recent increase in Lisinopril from 10 mg to 20 mg after Cv office visit and Renex to 1000 mg.  CM noted Renexa 1000 mg in blister pack but Lisinopril 10 mg only in am.  [name] was  contacted at the pt's home to request assist to obtain further Lisinopril so pt would be taking Lisinopril 20 mg daily as ordered. Pt and pharmacy staff agreed to have another blister pack delivered to pt to contain the needed extra 10 mg Lisinopril dose so she would be taking Lisinopril 20 mg qd.  Confirmed the blister packs for April 2018 will contain Lisinopril 20 mg qd dose as ordered.    Diabetes management:   Reviewed Mrs Campas Endocrinology glucose log from Dr Dorris Fetch office Noting higher cbg values for lunch and dinner versus HS and fasting am cbgs before breakfast.  Pt shared she generally takes in oatmeal and tea or pancakes and sausages for breakfast.  She reports eating a "half a bologna sandwich for lunch".  CM discussed the importance of protein and carbohydrates at meals especially her breakfast of only oatmeal and tea. Discussed eating protein then carbohydrates then vegetables.  Reviewed use of the plate method.  Allowed her to view pictures of variousl types of proteins  online and wrote the ones she likes on her list on her refrigerator.   CM reviewed items in her oatmeal and was shown the sugared cinnamon and syrup labelled honey used. CM discussed the sugar content of these that causes elevations in her blood sugar.  CM did a detail inventory of pt refrigerator to discuss the foods to avoid especially the carbonated beverages.  Pt voices understanding and appreciation of information "I'm learning something"   Orthodontic management: Ms Chiao had upper teeth removed on 01/18/17 and is scheduled to have "two more removed on Monday" (02/04/17) Reports she will be transported to her appointment by her Monday, Wednesday and Friday aide, "Mardene Celeste" or her cousin.    Housing: Pt confirms she has been informed by her apartment manager that she will be able to move in June 2018 after the inspection of a completed sidewalk. She is very excited about this change of apartments because of the noise heard  by the residents living above her apartment. CM also noted increase noise from overhead residents during home visit.  Plan:   I plan to follow up with pt for a home visit on February 12 2017 at 1 pm as agreed by pt prior to leaving her home today    [place]. Lavina Hamman, RN, BSN, [facility] [phone]   Janalyn Shy, [place], BSN, RN, CCM [facility] Care Management [phone]

## 2017-01-31 ENCOUNTER — Encounter: Payer: Self-pay | Admitting: "Endocrinology

## 2017-01-31 ENCOUNTER — Ambulatory Visit (INDEPENDENT_AMBULATORY_CARE_PROVIDER_SITE_OTHER): Payer: Medicare Other | Admitting: "Endocrinology

## 2017-01-31 ENCOUNTER — Ambulatory Visit: Payer: Medicare Other | Admitting: Nutrition

## 2017-01-31 ENCOUNTER — Ambulatory Visit: Payer: Medicare Other | Admitting: "Endocrinology

## 2017-01-31 VITALS — BP 151/80 | HR 76 | Ht 67.0 in | Wt 202.0 lb

## 2017-01-31 DIAGNOSIS — E1122 Type 2 diabetes mellitus with diabetic chronic kidney disease: Secondary | ICD-10-CM

## 2017-01-31 DIAGNOSIS — N184 Chronic kidney disease, stage 4 (severe): Secondary | ICD-10-CM

## 2017-01-31 DIAGNOSIS — E782 Mixed hyperlipidemia: Secondary | ICD-10-CM | POA: Diagnosis not present

## 2017-01-31 DIAGNOSIS — I10 Essential (primary) hypertension: Secondary | ICD-10-CM

## 2017-01-31 DIAGNOSIS — I25118 Atherosclerotic heart disease of native coronary artery with other forms of angina pectoris: Secondary | ICD-10-CM

## 2017-01-31 DIAGNOSIS — Z794 Long term (current) use of insulin: Secondary | ICD-10-CM

## 2017-01-31 NOTE — Progress Notes (Signed)
Subjective:    Patient ID: Krystal [name], female    DOB: February 18, 1936, PCP Krystal Font, MD   Past Medical History:  Diagnosis Date  . Allergic rhinitis   . CAD (coronary artery disease) 2009   5 stents- #3 in RCA, #1 each in LAD and AVG  . CKD (chronic kidney disease) stage 3, GFR 30-59 ml/min   . Complete heart block, transient 2014  . Diastolic dysfunction, left ventricle   . DJD (degenerative joint disease)   . Emphysema lung (HCC)    2L N/C continuously  . Essential hypertension   . GERD (gastroesophageal reflux disease)   . Glaucoma   . Gout   . History of uterine cancer   . HOH (hard of hearing)   . Hyperlipidemia   . MI (myocardial infarction) 1998  . Microcytic anemia    History of occult blood in stool  . Myalgia   . Osteoarthritis   . Palpitations   . Personal history of colonic polyps   . Sleep apnea    CPAP  . Type II diabetes mellitus with nephropathy [facility])    Past Surgical History:  Procedure Laterality Date  . ABDOMINAL HYSTERECTOMY  04/2010   Uterine cancer,TAHBSO  . CERVICAL DISCECTOMY     L5 left/hemilminectomy  . COLONOSCOPY  09/2006   Int hemmorhoids, COMPLICATED BY CARDIOPULMONARY COMPLICATIONS  . CORONARY ANGIOPLASTY  1/99, 1/07, 1/08, 4/09   5 cardiac stents total  . ECTOPIC PREGNANCY SURGERY    . FOOT SURGERY     Left and right for callous  . LEFT AND RIGHT HEART CATHETERIZATION WITH CORONARY ANGIOGRAM N/A 09/12/2012   Procedure: LEFT AND RIGHT HEART CATHETERIZATION WITH CORONARY ANGIOGRAM;  Surgeon: Sanda Klein, MD;  Location: [facility];  Service: Cardiovascular;  Laterality: N/A;  . POLYPECTOMY  10/22/2011   Internal hemorrhoids/sessile polyp   Social History   Social History  . Marital status: Widowed    Spouse name: N/A  . Number of children: 0  . Years of education: 12   Occupational History  .      retired   Social History Main Topics  . Smoking status: Former Smoker    Packs/day: 0.40    Years: 51.00   Types: Cigarettes    Start date: 04/22/1955    Quit date: 10/18/2006  . Smokeless tobacco: Never Used     Comment: quit about 4 yrs ago  . Alcohol use No  . Drug use: No  . Sexual activity: No   Other Topics Concern  . None   Social History Narrative   Consumes 2 cups of caffeine daily   Outpatient Encounter Prescriptions as of 01/31/2017  Medication Sig  . Insulin Aspart Prot & Aspart (NOVOLOG MIX 70/30 FLEXPEN ) Inject 40-60 Units into the skin 2 (two) times daily with a meal.  . ACCU-CHEK AVIVA PLUS test strip USE AS DIRECTED TO CHECK BLOOD SUGAR TWICE DAILY.  Marland Kitchen aspirin 81 MG EC tablet Take 81 mg by mouth every morning.   . Blood Glucose Monitoring Suppl (ACCU-CHEK AVIVA) device Use as instructed bid. E11.65  . calcitRIOL (ROCALTROL) 0.25 MCG capsule Take 0.25 mcg by mouth daily. Reported on 03/30/2016  . clopidogrel (PLAVIX) 75 MG tablet TAKE 1 TABLET ONCE DAILY.  . cyclobenzaprine (FLEXERIL) 5 MG tablet Take 5 mg by mouth at bedtime.  . famotidine (PEPCID) 20 MG tablet Take 1 tablet (20 mg total) by mouth daily.  . febuxostat (ULORIC) 40 MG tablet Take  40 mg by mouth daily.  . ferrous sulfate 325 (65 FE) MG tablet Take 325 mg by mouth daily.   . Fluticasone-Salmeterol (ADVAIR DISKUS) 250-50 MCG/DOSE AEPB Inhale 1 puff into the lungs 2 (two) times daily.  Marland Kitchen HYDROcodone-acetaminophen (NORCO/VICODIN) 5-325 MG tablet Take 1 tablet by mouth every 12 (twelve) hours as needed for moderate pain.  Marland Kitchen insulin aspart protamine - aspart (NOVOLOG 70/30 MIX) (70-30) 100 UNIT/ML FlexPen Inject 35-50 Units into the skin 2 (two) times daily before a meal. Inject 50 units just before  breakfast if blood glucose is above 90 and inject 40 units just before  supper if blood glucose is above 90.  . Insulin Pen Needle (B-D ULTRAFINE III SHORT PEN) 31G X 8 MM MISC 1 each by Does not apply route as directed.  . isosorbide mononitrate (IMDUR) 60 MG 24 hr tablet TAKE 1 TABLET ONCE DAILY.  Marland Kitchen Lancets (ACCU-CHEK  SOFT TOUCH) lancets Use as instructed bid. E11.65.  Marland Kitchen levofloxacin (LEVAQUIN) 500 MG tablet Take 500 mg by mouth daily.  Marland Kitchen lisinopril (PRINIVIL,ZESTRIL) 20 MG tablet Take 1 tablet (20 mg total) by mouth daily.  . methylPREDNISolone (MEDROL) 4 MG tablet Take 4 mg by mouth daily.  . nitroGLYCERIN (NITROSTAT) 0.4 MG SL tablet Place 1 tablet (0.4 mg total) under the tongue every 5 (five) minutes as needed for chest pain.  Glory Rosebush VERIO test strip USE FOUR TIMES DAILY AS DIRECTED.  Marland Kitchen polyethylene glycol (MIRALAX / GLYCOLAX) packet Take 17 g by mouth daily as needed for mild constipation.  . ranolazine (RANEXA) 1000 MG SR tablet Take 1 tablet (1,000 mg total) by mouth 2 (two) times daily.  . rosuvastatin (CRESTOR) 20 MG tablet Take 1 tablet (20 mg total) by mouth every evening.  . torsemide (DEMADEX) 20 MG tablet MAY TAKE EXTRA 20MG  DAILY FOR SWELLING.  . ZETIA 10 MG tablet TAKE 1 TABLET BY MOUTH EVERY MORNING.  . [DISCONTINUED] NOVOLOG MIX 70/30 FLEXPEN (70-30) 100 UNIT/ML FlexPen INJECT 50 UNITS IN THE MORNING AND 40 UNITS IN THE EVENING   No facility-administered encounter medications on file as of 01/31/2017.    ALLERGIES: Allergies  Allergen Reactions  . Morphine Shortness Of Breath and Swelling  . Penicillins Shortness Of Breath and Swelling    Has patient had a PCN reaction causing immediate rash, facial/tongue/throat swelling, SOB or lightheadedness with hypotension: No Has patient had a PCN reaction causing severe rash involving mucus membranes or skin necrosis: No Has patient had a PCN reaction that required hospitalization Yes Has patient had a PCN reaction occurring within the last 10 years: No If all of the above answers are "NO", then may proceed with Cephalosporin use.   . Shellfish Allergy    VACCINATION STATUS: Immunization History  Administered Date(s) Administered  . Influenza Whole 09/23/2007  . Influenza,inj,Quad PF,36+ Mos 08/20/2015  . Influenza-Unspecified  09/19/2013  . Pneumococcal Polysaccharide-23 08/20/2015    Diabetes  She presents for her follow-up diabetic visit. She has type 2 diabetes mellitus. Onset time: She was diagnosed at approximate age of 87 years. Her disease course has been worsening. There are no hypoglycemic associated symptoms. Pertinent negatives for hypoglycemia include no confusion, headaches, pallor or seizures. Associated symptoms include fatigue, polydipsia and polyuria. Pertinent negatives for diabetes include no chest pain and no polyphagia. There are no hypoglycemic complications. Symptoms are worsening. Diabetic complications include heart disease, nephropathy, peripheral neuropathy and PVD. Risk factors for coronary artery disease include dyslipidemia, diabetes mellitus, hypertension, obesity, sedentary lifestyle and  tobacco exposure. Current diabetic treatment includes insulin injections (She was asked to bring all of her insulin supplies at home. She managed to bring to bags of various kinds of insulin mainly duplicates of long-acting, premixed, and rapid acting insulin types. At least half of the insulin pens she brought in are expi). She is following a generally unhealthy diet. She has had a previous visit with a dietitian. She never participates in exercise. Her home blood glucose trend is fluctuating dramatically (He did brought her meter and log showing above target BG readings , monitoring 2-3 times a day, E AG between 100-170.). Her breakfast blood glucose range is generally >200 mg/dl. Her lunch blood glucose range is generally >200 mg/dl. Her dinner blood glucose range is generally >200 mg/dl. Her overall blood glucose range is >200 mg/dl. An ACE inhibitor/angiotensin II receptor blocker is being taken.  Hyperlipidemia  This is a chronic problem. The current episode started more than 1 year ago. Exacerbating diseases include diabetes and obesity. Pertinent negatives include no chest pain, myalgias or shortness of  breath. Current antihyperlipidemic treatment includes bile acid squestrants. Risk factors for coronary artery disease include diabetes mellitus, dyslipidemia, hypertension and obesity.  Hypertension  This is a chronic problem. The current episode started more than 1 year ago. Pertinent negatives include no chest pain, headaches, palpitations or shortness of breath. Risk factors for coronary artery disease include diabetes mellitus, dyslipidemia, obesity, sedentary lifestyle and smoking/tobacco exposure. Past treatments include ACE inhibitors. Hypertensive end-organ damage includes kidney disease, CAD/MI and PVD.     Review of Systems  Constitutional: Positive for fatigue. Negative for unexpected weight change.  HENT: Negative for trouble swallowing and voice change.   Eyes: Negative for visual disturbance.  Respiratory: Negative for cough, shortness of breath and wheezing.   Cardiovascular: Negative for chest pain, palpitations and leg swelling.  Gastrointestinal: Negative for diarrhea, nausea and vomiting.  Endocrine: Positive for polydipsia and polyuria. Negative for cold intolerance, heat intolerance and polyphagia.  Musculoskeletal: Negative for arthralgias and myalgias.  Skin: Negative for color change, pallor, rash and wound.  Neurological: Negative for seizures and headaches.  Psychiatric/Behavioral: Negative for confusion and suicidal ideas.    Objective:    BP (!) 151/80   Pulse 76   Ht 5\' 7"  (1.702 m)   Wt 202 lb (91.6 kg)   BMI 31.64 kg/m   Wt Readings from Last 3 Encounters:  01/31/17 202 lb (91.6 kg)  01/18/17 198 lb (89.8 kg)  11/14/16 201 lb (91.2 kg)    Physical Exam  Constitutional: She is oriented to person, place, and time. She appears well-developed.  HENT:  Head: Normocephalic and atraumatic.  Eyes: EOM are normal.  Neck: Normal range of motion. Neck supple. No tracheal deviation present. No thyromegaly present.  Cardiovascular: Normal rate and regular  rhythm.   Pulses:      Dorsalis pedis pulses are 0 on the right side, and 0 on the left side.       Posterior tibial pulses are 0 on the right side, and 0 on the left side.  She has absent pulsations on right lateral dorsalis pedis and posterior tibial arteries.  Pulmonary/Chest: Effort normal and breath sounds normal.  Abdominal: Soft. Bowel sounds are normal. There is no tenderness. There is no guarding.  Musculoskeletal: Normal range of motion. She exhibits no edema.  Neurological: She is alert and oriented to person, place, and time. She has normal reflexes. A sensory deficit is present. No cranial nerve deficit. Coordination  normal.  She has significant deficit on monofilament test on bilateral feet.  Skin: Skin is warm and dry. No rash noted. No erythema. No pallor.  Psychiatric:  She is significantly reluctant with possible cognitive deficit.     Complete Blood Count (Most recent): Lab Results  Component Value Date   WBC 8.3 11/08/2015   HGB 9.9 (L) 11/08/2015   HCT 32.6 (L) 11/08/2015   MCV 75.8 (L) 11/08/2015   PLT 263 11/08/2015   Chemistry (most recent): Lab Results  Component Value Date   NA 141 01/24/2017   K 3.8 01/24/2017   CL 99 01/24/2017   CO2 30 01/24/2017   BUN 30 (H) 01/24/2017   CREATININE 1.99 (H) 01/24/2017   Diabetic Labs (most recent): Lab Results  Component Value Date   HGBA1C 10.4 (H) 01/24/2017   HGBA1C 11.3 (H) 07/04/2016   HGBA1C 12.2 (H) 05/07/2016   Lipid Panel     Component Value Date/Time   CHOL 151 05/07/2016 1006   TRIG 217 (H) 05/07/2016 1006   HDL 37 (L) 05/07/2016 1006   CHOLHDL 4.1 05/07/2016 1006   VLDL 43 (H) 05/07/2016 1006   LDLCALC 71 05/07/2016 1006   LDLDIRECT 118.4 08/13/2013 1502     Assessment & Plan:   1. Uncontrolled type 2 diabetes mellitus with other specified complication ([place])  - Her  diabetes is  complicated by coronary artery disease, chronic kidney disease, peripheral neuropathy, and peripheral  arterial disease and patient remains at a high risk for more acute and chronic complications of diabetes which include CAD, CVA, CKD, retinopathy, and neuropathy. These are all discussed in detail with the patient.  -Her recent A1c slowly improving to 10.4% from 12.2%. -She admits significant dietary indiscretion, admits to ingestion of large amounts of soda and processed carbohydrates. Recent labs reviewed.   - I have re-counseled the patient on diet management  by adopting a carbohydrate restricted / protein rich  Diet.  - Suggestion is made for patient to avoid simple carbohydrates   from their diet including Cakes , Desserts, Ice Cream,  Soda (  diet and regular) , Sweet Tea , Candies,  Chips, Cookies, Artificial Sweeteners,   and "Sugar-free" Products .  This will help patient to have stable blood glucose profile and potentially avoid unintended  Weight gain.  - Patient is advised to stick to a routine mealtimes to eat 3 meals  a day and avoid unnecessary snacks ( to snack only to correct hypoglycemia).  - The patient  has been  scheduled with Jearld Fenton, RDN, CDE for individualized DM education.  - I have approached patient with the following individualized plan to manage diabetes and patient agrees.   -Krystal [name]  would have benefited from basal/bolus insulin however this was too overwhelming for her.  she lives alone. - She came with slightly better blood glucose profile.  She will be continued on premixed insulin Novolog 70/30, increase to 60 units with breakfast and 40 units with supper for pre-meal blood glucose above 90 mg/dL. I have taken away all of the insulin production protein to avoid inadvertent insulin overdose and hypoglycemia.  -Patient is encouraged to call clinic for blood glucose levels less than 70 or above 300 mg /dl.  -She will be taken off of Victoza.   - Patient specific target  for A1c; LDL, HDL, Triglycerides, and  Waist Circumference were discussed in  detail.  2) BP/HTN: Uncontrolled, patient could not confirm if she is consistent taking her blood  pressure medications. I advised her to resume her current medications including ACEI/ARB.  3) Lipids/HPL:  continue ezetimibe, continue Crestor 20 mg by mouth daily at bedtime.   4)  Weight/Diet: CDE consult in progress, exercise, and carbohydrates information provided.  5) Chronic Care/Health Maintenance:  -Patient  on ACEI/ARB and Statin medications and encouraged to continue to follow up with Ophthalmology, Podiatrist at least yearly or according to recommendations, and advised to  stay away from smoking. I have recommended yearly flu vaccine and pneumonia vaccination at least every 5 years; moderate intensity exercise for up to 150 minutes weekly; and  sleep for at least 7 hours a day. - She has significant peripheral neuropathy and peripheral arterial disease putting her at risk for diabetes foot ulcer. She is being evaluated by her podiatrist for custom shoes. She would benefit from this therapeutic measure. I have signed off on her paperwork for same. - 25 minutes of time was spent on the care of this patient , 50% of which was applied for counseling on diabetes complications and their preventions.  - I advised patient to maintain close follow up with Krystal Font, MD for primary care needs.  Patient is asked to bring meter and  blood glucose logs during their next visit.   Follow up plan: -Return in about 3 months (around 05/03/2017) for follow up with pre-visit labs, meter, and logs.  Glade Lloyd, MD Phone: [phone]  Fax: [phone]   01/31/2017, 2:42 PM

## 2017-02-12 ENCOUNTER — Other Ambulatory Visit: Payer: Self-pay | Admitting: *Deleted

## 2017-02-12 ENCOUNTER — Ambulatory Visit: Payer: Medicare Other | Admitting: *Deleted

## 2017-02-12 NOTE — Patient Outreach (Signed)
[facility]) Care Management   02/12/2017  Krystal [name] [birth year] [id number]  Krystal [name] is an 81 y.o. female with diabetes mellitus, nephropathy, peripheral neuropathy,dyslipidemia, hypertension, and PVD. Krystal [name] was originally referred to [facility] for transition of care services after a hospital admission in December/January for hypertensive crisis. Most recently [facility] has been assisting Krystal [name] with medication management and assistance and diabetes management and education.   Subjective:  "I feel dizzy and got a headache"  "last week took stool softener ' water tea 2 cups 16 oz water "chest pain off and on but goes away"   Objective:  BP (!) 178/76 (BP Location: Left Arm, Patient Position: Sitting, Cuff Size: Large)   Pulse 84   SpO2 95%    Recheck of vitals  BP (!) 156/70 (BP Location: Left Arm, Patient Position: Sitting)   Pulse 84   SpO2 95%    Review of Systems  Constitutional: Negative for chills, diaphoresis, fever, malaise/fatigue and weight loss.  HENT: Negative for congestion, ear discharge, ear pain, hearing loss, nosebleeds, sinus pain, sore throat and tinnitus.   Eyes: Negative.  Negative for blurred vision, double vision, photophobia, pain and redness.  Respiratory: Positive for shortness of breath. Negative for cough, hemoptysis, sputum production, wheezing and stridor.   Cardiovascular: Positive for chest pain and leg swelling. Negative for palpitations, orthopnea and claudication.       Swelling right ankle only  Gastrointestinal: Positive for abdominal pain and constipation. Negative for blood in stool, diarrhea, heartburn, melena, nausea and vomiting.       Abdominal pain at intervals and point to chest pain   Genitourinary: Negative for dysuria, flank pain, frequency, hematuria and urgency.  Musculoskeletal: Positive for myalgias. Negative for back pain, falls, joint pain and neck pain.  Skin:  Negative.  Negative for itching and rash.  Neurological: Positive for dizziness, weakness and headaches. Negative for tremors, sensory change, speech change, focal weakness, seizures and loss of consciousness.  Endo/Heme/Allergies: Negative for environmental allergies and polydipsia. Does not bruise/bleed easily.  Psychiatric/Behavioral: Negative.  Negative for depression, hallucinations, memory loss, substance abuse and suicidal ideas. The patient is not nervous/anxious and does not have insomnia.     Physical Exam  Constitutional: She is oriented to person, place, and time. She appears well-developed and well-nourished.  HENT:  Right Ear: External ear normal.  Left Ear: External ear normal.  Nose: Nose normal.  Eyes: Conjunctivae are normal. Right eye exhibits no discharge. Left eye exhibits no discharge.  Neck: Normal range of motion. Neck supple. No JVD present. No tracheal deviation present. No thyromegaly present.  Cardiovascular: Normal rate, regular rhythm, normal heart sounds and intact distal pulses.  Exam reveals no gallop and no friction rub.   No murmur heard. Respiratory: Effort normal and breath sounds normal. No stridor.  GI: Soft. Bowel sounds are normal. There is tenderness.  Complaint of no BM "since last week"   Musculoskeletal: She exhibits edema. She exhibits no tenderness or deformity.  Edema outer aspects of ankles   Lymphadenopathy:    She has no cervical adenopathy.  Neurological: She is alert and oriented to person, place, and time. She displays normal reflexes. No cranial nerve deficit. She exhibits normal muscle tone. Coordination normal.  Skin: Skin is warm and dry. No rash noted. No erythema. No pallor.  Psychiatric: She has a normal mood and affect. Her behavior is normal. Judgment and thought content normal.    Encounter Medications:  Outpatient Encounter Prescriptions as of 02/12/2017  Medication Sig Note  . ACCU-CHEK AVIVA PLUS test strip USE AS  DIRECTED TO CHECK BLOOD SUGAR TWICE DAILY.   Marland Kitchen aspirin 81 MG EC tablet Take 81 mg by mouth every morning.    . Blood Glucose Monitoring Suppl (ACCU-CHEK AVIVA) device Use as instructed bid. E11.65   . calcitRIOL (ROCALTROL) 0.25 MCG capsule Take 0.25 mcg by mouth daily. Reported on 03/30/2016   . clopidogrel (PLAVIX) 75 MG tablet TAKE 1 TABLET ONCE DAILY.   . cyclobenzaprine (FLEXERIL) 5 MG tablet Take 5 mg by mouth at bedtime.   . famotidine (PEPCID) 20 MG tablet Take 1 tablet (20 mg total) by mouth daily.   . febuxostat (ULORIC) 40 MG tablet Take 40 mg by mouth daily. 09/06/2016: Requested transition from bottle to blister pack beginning of November; confirmed with pharmacist @ [place] 09/06/2016   . ferrous sulfate 325 (65 FE) MG tablet Take 325 mg by mouth daily.  09/06/2016: Requested transition from bottle to blister pack beginning of November; confirmed with pharmacist @ [place] 09/06/2016  . Fluticasone-Salmeterol (ADVAIR DISKUS) 250-50 MCG/DOSE AEPB Inhale 1 puff into the lungs 2 (two) times daily.   Marland Kitchen HYDROcodone-acetaminophen (NORCO/VICODIN) 5-325 MG tablet Take 1 tablet by mouth every 12 (twelve) hours as needed for moderate pain.   . Insulin Aspart Prot & Aspart (NOVOLOG MIX 70/30 FLEXPEN [place]) Inject 40-60 Units into the skin 2 (two) times daily with a meal.   . insulin aspart protamine - aspart (NOVOLOG 70/30 MIX) (70-30) 100 UNIT/ML FlexPen Inject 35-50 Units into the skin 2 (two) times daily before a meal. Inject 50 units just before  breakfast if blood glucose is above 90 and inject 40 units just before  supper if blood glucose is above 90. 11/28/2016: Patient is now taking 50u qam and 40u qpm  . Insulin Pen Needle (B-D ULTRAFINE III SHORT PEN) 31G X 8 MM MISC 1 each by Does not apply route as directed.   . isosorbide mononitrate (IMDUR) 60 MG 24 hr tablet TAKE 1 TABLET ONCE DAILY.   Marland Kitchen Lancets (ACCU-CHEK SOFT TOUCH) lancets Use as instructed bid. E11.65.   Marland Kitchen levofloxacin  (LEVAQUIN) 500 MG tablet Take 500 mg by mouth daily.   Marland Kitchen lisinopril (PRINIVIL,ZESTRIL) 20 MG tablet Take 1 tablet (20 mg total) by mouth daily.   . methylPREDNISolone (MEDROL) 4 MG tablet Take 4 mg by mouth daily. 11/28/2016: Completed on 11/27/16  . nitroGLYCERIN (NITROSTAT) 0.4 MG SL tablet Place 1 tablet (0.4 mg total) under the tongue every 5 (five) minutes as needed for chest pain. 04/30/2016: Has on hand if needed.   Glory Rosebush VERIO test strip USE FOUR TIMES DAILY AS DIRECTED.   Marland Kitchen polyethylene glycol (MIRALAX / GLYCOLAX) packet Take 17 g by mouth daily as needed for mild constipation. 04/30/2016: Takes if needed  . ranolazine (RANEXA) 1000 MG SR tablet Take 1 tablet (1,000 mg total) by mouth 2 (two) times daily.   . rosuvastatin (CRESTOR) 20 MG tablet Take 1 tablet (20 mg total) by mouth every evening.   . torsemide (DEMADEX) 20 MG tablet MAY TAKE EXTRA 20MG  DAILY FOR SWELLING.   . ZETIA 10 MG tablet TAKE 1 TABLET BY MOUTH EVERY MORNING.    No facility-administered encounter medications on file as of 02/12/2017.     Assessment:   Acute Condition (GI and dizziness concerns) During this [facility] CM home visit with Mrs Lybarger, she informs, CM that she had a headache and  was dizzy She reported the headache pain as a "eight" She reports these symptoms noted this morning when she reached over in bed to get her CPAP.  She was encouraged to put on her home oxygen (not on upon CM arrival) and to allow CM to check her bp which was elevated(systolic 573) . Mrs Weisel also stated she was constipated and her last bm was "last week"  After the ROS and physical assessments were completed [facility] CM called [name] (pcp) office from Ms Tappan's home.to review her symptoms with his nurse, [name].  [name] (himself) assessed Mrs Agar via telephone and recommended she take her miralax two times a day and if she had not had a stool she was to notify him for further treatment.  THN CM assisted Mrs Malta to take a 16 oz  bottle of water and miralax.    Chronic Conditions ( DM)/Mobility/housing    Mrs Ratledge reports minimal change in her DM diet and her blood sugar averages for her 7, 14 and 30 day values remain >200. She reports that she has not eating related to her GI symptoms and believes her elevations in blood sugars may be related to "not feeling well" THN CM counseled Mrs Rollison to continues to manage her DM with diet, medication and exercise..  Mrs Kirkey has not called nor visited [place] to check on th cost of a quad cane since last home visit Encouraged her to make contact  Mrs Nall continues looking forward to moving to a different area of her apartment complex soon but continues to have complaints about this noise her neighbor's child makes   Medication management On 01/29/17 Mrs Greif received missing medications from her blister pack from [place] to include ranexa 500  Mg bid.  During today's visit with her, [name] delivery staff arrived with a duplicate delivery of medication, that she had received on 01/29/17.  These medications were returned with he delivery staff back to [place].  Pt was able to show [facility] CM that she had also been delivered medications for the month of April 2018.  Mrs Sitzmann reports concerns with pharmacy sending medication "too early"   [facility] CM Care Plan Problem One     Most Recent Value  Care Plan Problem One  Knowledge deficit related to diabetes as evidenced by patient questions around diabetes  Role Documenting the Problem One  Care Management [place] for Problem One  Active  THN Long Term Goal (31-90 days)  Patient will verbalize understanding of diabetes management over the next 60 days  THN Long Term Goal Start Date  01/24/17  Interventions for Problem One Long Term Goal  discussed current plan of care,  reviewed upcoming appointment with CDE/RD,  reviewed MD recommendations  THN CM Short Term Goal #1 (0-30 days)  Over the  next 30 days, patient will verbalize understanding of which medications are used to treat her diabetes  THN CM Short Term Goal #1 Start Date  01/24/17  Interventions for Short Term Goal #1  Medication review planned for next week's home visit  THN CM Short Term Goal #2 (0-30 days)  Over the next 30 days, patient will check cbg's daily and record as prescribed  THN CM Short Term Goal #2 Start Date  01/24/17  Interventions for Short Term Goal #2  Reviewed prescribed plan to check cbg's daily,  plan review on next week's home visit  THN CM Short Term Goal #3 (0-30 days)  over the next 30 days, patient will verbalize understanding of basic components of prescribed carb modified diet  THN CM Short Term Goal #3 Start Date  01/24/17  Interventions for Short Tern Goal #3  discussed carb modified diet,  prescribed Emmi educaitonal materials,  planned detailed review for next week's home visit       Plan: Mrs Reif will be contacted later on 02/12/17 and if needed on 02/13/17 to follow up on her acute symptoms.  Mrs Kocurek will be seen for a [facility] CM home visit in 2 weeks.  Her providers will be updated.

## 2017-02-13 ENCOUNTER — Other Ambulatory Visit: Payer: Self-pay | Admitting: *Deleted

## 2017-02-13 NOTE — Patient Outreach (Signed)
[facility]) Care Management  02/13/2017  [name] 29-Jul-1936 [id number]   THN CM followed up on pt after home visit on 02/12/17.  Krystal [name] states when [name] CM called on 02/12/17 to follow up with her she "was probably at the mailbox"  Krystal [name] confirms with [name] CM that she had a good stool and feels better today as far as her GI issues are concerned but continues with dizziness when she moves but not at rest.  Krystal [name] reports that she has not had breakfast at this time and is awaiting her personal care services CNA arrival near noon.   CM inquired if she had taken her blood pressure today and was informed she had not taken her blood pressure.  THN CM coached Krystal [name] in taking her blood pressure but she continued to get error messages.  She reports her BP machine is a new one and therefore should not be "acting up"  Krystal [name] states she will have her PCS CNA check her BP when she arrives.  Krystal [name] was advised to call [name] if her BP is elevated to speak with his nurse [name] about dizziness and if BP is elevated.  She informed THN CM she would call [name] office.  Plan:  I will call Krystal [name] to follow up with her and see her for a home visit on February 26 2017 at 1 pm.

## 2017-02-19 DIAGNOSIS — E1122 Type 2 diabetes mellitus with diabetic chronic kidney disease: Secondary | ICD-10-CM | POA: Diagnosis not present

## 2017-02-19 DIAGNOSIS — I10 Essential (primary) hypertension: Secondary | ICD-10-CM | POA: Diagnosis not present

## 2017-02-19 DIAGNOSIS — N183 Chronic kidney disease, stage 3 (moderate): Secondary | ICD-10-CM | POA: Diagnosis not present

## 2017-02-19 DIAGNOSIS — I951 Orthostatic hypotension: Secondary | ICD-10-CM | POA: Diagnosis not present

## 2017-02-22 DIAGNOSIS — R809 Proteinuria, unspecified: Secondary | ICD-10-CM | POA: Diagnosis not present

## 2017-02-22 DIAGNOSIS — N183 Chronic kidney disease, stage 3 (moderate): Secondary | ICD-10-CM | POA: Diagnosis not present

## 2017-02-22 DIAGNOSIS — D509 Iron deficiency anemia, unspecified: Secondary | ICD-10-CM | POA: Diagnosis not present

## 2017-02-22 DIAGNOSIS — I10 Essential (primary) hypertension: Secondary | ICD-10-CM | POA: Diagnosis not present

## 2017-02-22 DIAGNOSIS — Z79899 Other long term (current) drug therapy: Secondary | ICD-10-CM | POA: Diagnosis not present

## 2017-02-22 DIAGNOSIS — E559 Vitamin D deficiency, unspecified: Secondary | ICD-10-CM | POA: Diagnosis not present

## 2017-02-26 ENCOUNTER — Other Ambulatory Visit: Payer: Self-pay | Admitting: *Deleted

## 2017-02-26 NOTE — Patient Outreach (Signed)
[facility]) Care Management   02/26/2017  Krystal [name] May 15, 1936 [id number]  Krystal [name] is an 81 y.o. female with diabetes mellitus, nephropathy, peripheral neuropathy,dyslipidemia, hypertension, and PVD. Krystal [name] was originally referred to [facility] for transition of care services after a hospital admission in December/January for hypertensive crisis. Most recently [facility] has been assisting Krystal [name] with medication management and assistance and diabetes management and education.   Subjective: "I still get dizzy', " I have not had a BM in five days" " I have not taken the medicine every day"  Objective:   BP 140/66 (BP Location: Left Arm, Patient Position: Sitting, Cuff Size: Large)   Pulse 91   SpO2 98%  Review of Systems  Constitutional: Negative.   HENT: Negative.   Eyes: Negative for blurred vision, double vision, photophobia, pain, discharge and redness.  Respiratory: Positive for sputum production and shortness of breath. Negative for cough, hemoptysis and wheezing.        Yellow secretions (related to pollen)   Cardiovascular: Negative.  Negative for chest pain, palpitations, orthopnea, claudication, leg swelling and PND.       Chest pressure under her breast goes and come  When sob comes and goes.  Gastrointestinal: Positive for abdominal pain and constipation. Negative for blood in stool, diarrhea, heartburn, nausea and vomiting.        Continue stool q 4 -5 days miralax cause diarrhea 1 cap qd qod,   Genitourinary: Negative.  Negative for dysuria, frequency, hematuria and urgency.  Musculoskeletal: Positive for myalgias. Negative for back pain, falls, joint pain and neck pain.  Skin: Negative.   Neurological: Positive for dizziness, tremors and headaches. Negative for sensory change, focal weakness and seizures.  Endo/Heme/Allergies: Negative for environmental allergies and polydipsia.  Psychiatric/Behavioral:  Negative for depression, hallucinations, memory loss, substance abuse and suicidal ideas. The patient is not nervous/anxious and does not have insomnia.     Physical Exam  Constitutional: She is oriented to person, place, and time. She appears well-developed and well-nourished.  HENT:  Head: Normocephalic and atraumatic.  Neck: Normal range of motion.  Cardiovascular: Normal rate.   Respiratory: Effort normal and breath sounds normal.  GI: Soft. Bowel sounds are normal.  Musculoskeletal: Normal range of motion.  Neurological: She is alert and oriented to person, place, and time.  Skin: Skin is warm and dry.  Psychiatric: She has a normal mood and affect. Her behavior is normal. Judgment and thought content normal.    Encounter Medications:   Outpatient Encounter Prescriptions as of 02/26/2017  Medication Sig Note  . ACCU-CHEK AVIVA PLUS test strip USE AS DIRECTED TO CHECK BLOOD SUGAR TWICE DAILY.   Marland Kitchen aspirin 81 MG EC tablet Take 81 mg by mouth every morning.    . Blood Glucose Monitoring Suppl (ACCU-CHEK AVIVA) device Use as instructed bid. E11.65   . calcitRIOL (ROCALTROL) 0.25 MCG capsule Take 0.25 mcg by mouth daily. Reported on 03/30/2016   . clopidogrel (PLAVIX) 75 MG tablet TAKE 1 TABLET ONCE DAILY.   . cyclobenzaprine (FLEXERIL) 5 MG tablet Take 5 mg by mouth at bedtime.   . famotidine (PEPCID) 20 MG tablet Take 1 tablet (20 mg total) by mouth daily.   . febuxostat (ULORIC) 40 MG tablet Take 40 mg by mouth daily. 09/06/2016: Requested transition from bottle to blister pack beginning of November; confirmed with pharmacist @ [place] 09/06/2016   . ferrous sulfate 325 (65 FE) MG tablet Take 325 mg  by mouth daily.  09/06/2016: Requested transition from bottle to blister pack beginning of November; confirmed with pharmacist @ [place] 09/06/2016  . Fluticasone-Salmeterol (ADVAIR DISKUS) 250-50 MCG/DOSE AEPB Inhale 1 puff into the lungs 2 (two) times daily.   Marland Kitchen  HYDROcodone-acetaminophen (NORCO/VICODIN) 5-325 MG tablet Take 1 tablet by mouth every 12 (twelve) hours as needed for moderate pain.   . Insulin Aspart Prot & Aspart (NOVOLOG MIX 70/30 FLEXPEN Krystal [name]) Inject 40-60 Units into the skin 2 (two) times daily with a meal.   . insulin aspart protamine - aspart (NOVOLOG 70/30 MIX) (70-30) 100 UNIT/ML FlexPen Inject 35-50 Units into the skin 2 (two) times daily before a meal. Inject 50 units just before  breakfast if blood glucose is above 90 and inject 40 units just before  supper if blood glucose is above 90. 11/28/2016: Patient is now taking 50u qam and 40u qpm  . Insulin Pen Needle (B-D ULTRAFINE III SHORT PEN) 31G X 8 MM MISC 1 each by Does not apply route as directed.   . isosorbide mononitrate (IMDUR) 60 MG 24 hr tablet TAKE 1 TABLET ONCE DAILY.   Marland Kitchen Lancets (ACCU-CHEK SOFT TOUCH) lancets Use as instructed bid. E11.65.   Marland Kitchen levofloxacin (LEVAQUIN) 500 MG tablet Take 500 mg by mouth daily.   Marland Kitchen lisinopril (PRINIVIL,ZESTRIL) 20 MG tablet Take 1 tablet (20 mg total) by mouth daily.   . methylPREDNISolone (MEDROL) 4 MG tablet Take 4 mg by mouth daily. 11/28/2016: Completed on 11/27/16  . nitroGLYCERIN (NITROSTAT) 0.4 MG SL tablet Place 1 tablet (0.4 mg total) under the tongue every 5 (five) minutes as needed for chest pain. 04/30/2016: Has on hand if needed.   Krystal [name] VERIO test strip USE FOUR TIMES DAILY AS DIRECTED.   Marland Kitchen polyethylene glycol (MIRALAX / GLYCOLAX) packet Take 17 g by mouth daily as needed for mild constipation. 04/30/2016: Takes if needed  . ranolazine (RANEXA) 1000 MG SR tablet Take 1 tablet (1,000 mg total) by mouth 2 (two) times daily.   . rosuvastatin (CRESTOR) 20 MG tablet Take 1 tablet (20 mg total) by mouth every evening.   . torsemide (DEMADEX) 20 MG tablet MAY TAKE EXTRA 20MG  DAILY FOR SWELLING.   . ZETIA 10 MG tablet TAKE 1 TABLET BY MOUTH EVERY MORNING.    No facility-administered encounter medications on file as of 02/26/2017.      Functional Status:   In your present state of health, do you have any difficulty performing the following activities: 04/23/2016 02/29/2016  Hearing? N N  Vision? Y Y  Difficulty concentrating or making decisions? N N  Walking or climbing stairs? Y Y  Dressing or bathing? N N  Doing errands, shopping? [name]  Preparing Food and eating ? N N  Using the Toilet? N N  In the past six months, have you accidently leaked urine? Y Y  Do you have problems with loss of bowel control? Y N  Managing your Medications? Y Y  Managing your Finances? Y N  Housekeeping or managing your Housekeeping? [name]  Some recent data might be hidden    Fall/Depression Screening:    PHQ 2/9 Scores 01/31/2017 09/25/2016 05/11/2016 04/13/2016 03/16/2016 02/23/2016 02/01/2016  PHQ - 2 Score 0 0 0 0 0 0 0    Assessment:    Acute Condition (GI and dizziness concerns) During this [facility] CM home visit with Krystal [name], she informs CM she continues to have dizziness but has not checked her BP THNCM checked her  BP manually  and practiced with Krystal [name] on using her [employer] BP machine (best used at lower part of her arm)  She did have on her home oxygen upon CM arrival today . Krystal [name] also stated she was constipated and her last bm was "five days ago" Counseled Krystal [name] again on Miralax use qd- qod.    After the ROS and physical assessments were completed THN CM discussed the importance of a regular bowel elimination program, use of salt and intake of fluids  Chronic Conditions ( DM)/Mobility/housing    Krystal [name] reports small changes in her DM diet and her blood sugar averages for her 7, 14 and 30 day values remain >200. She reports that she is eating better now that her gums are healed from having teeth removed and she considering having further teeth removed when she gets the money.  THN CM counseled Krystal [name] to continues to manage her DM with diet, medication and exercise..  Krystal [name] was provided with general cost  of a quad cane after consulting with [employer], [employer].   Krystal [name] continues looking forward to moving to a different area of her apartment complex soon but continues to have complaints about this noise her neighbor's child makes.  THN CM did speak with Clarise Cruz in the apartment complex via telephone and was able to offer the numbers for sara (to call during the day and she would walk down to speak with the neighbors)  and marcus [place] [phone] to call between hours of 10 pm and 6 am). These contact numbers were written down by [facility] CM and placed in [facility] calendar for Krystal [name]. Minimal noise heard during this home visit from the neighbors.    Medication management Krystal [name] is not having issues with medications today She was able to review medications in her blister/bubble pack and had taken her medications today.  She had called her pharmacy to return all extra medications from last month   Plan:  [facility] CM will follow up with Krystal [name] for a home visit in 2 weeks  [facility] CM Care Plan Problem One     Most Recent Value  Care Plan Problem One  Knowledge deficit related to diabetes as evidenced by patient questions around diabetes  Role Documenting the Problem One  Care Management [place] for Problem One  Active  THN Long Term Goal (31-90 days)  Patient will verbalize understanding of diabetes management over the next 60 days  THN Long Term Goal Start Date  01/24/17  Interventions for Problem One Long Term Goal  discussed current plan of care,  reviewed upcoming appointment with CDE/RD,  reviewed MD recommendations  THN CM Short Term Goal #1 (0-30 days)  Over the next 30 days, patient will verbalize understanding of which medications are used to treat her diabetes  THN CM Short Term Goal #1 Start Date  01/24/17  Interventions for Short Term Goal #1  Medication review planned for next week's home visit  THN CM Short Term Goal #2 (0-30 days)  Over the next 30  days, patient will check cbg's daily and record as prescribed  THN CM Short Term Goal #2 Start Date  01/24/17  Interventions for Short Term Goal #2  Reviewed prescribed plan to check cbg's daily,  plan review on next week's home visit  THN CM Short Term Goal #3 (0-30 days)  over the next 30 days, patient will verbalize understanding of basic components of  prescribed carb modified diet  THN CM Short Term Goal #3 Start Date  01/24/17  Interventions for Short Tern Goal #3  discussed carb modified diet,  prescribed Emmi educaitonal materials,  planned detailed review for next week's home visit       Bettyjane Shenoy L. Lavina Hamman, RN, BSN, [place] [facility] Management [phone]

## 2017-02-27 DIAGNOSIS — N184 Chronic kidney disease, stage 4 (severe): Secondary | ICD-10-CM | POA: Diagnosis not present

## 2017-02-27 DIAGNOSIS — I10 Essential (primary) hypertension: Secondary | ICD-10-CM | POA: Diagnosis not present

## 2017-02-27 DIAGNOSIS — E559 Vitamin D deficiency, unspecified: Secondary | ICD-10-CM | POA: Diagnosis not present

## 2017-02-27 DIAGNOSIS — R809 Proteinuria, unspecified: Secondary | ICD-10-CM | POA: Diagnosis not present

## 2017-02-27 DIAGNOSIS — D638 Anemia in other chronic diseases classified elsewhere: Secondary | ICD-10-CM | POA: Diagnosis not present

## 2017-02-27 DIAGNOSIS — N2581 Secondary hyperparathyroidism of renal origin: Secondary | ICD-10-CM | POA: Diagnosis not present

## 2017-02-28 ENCOUNTER — Encounter: Payer: Self-pay | Admitting: *Deleted

## 2017-03-22 ENCOUNTER — Other Ambulatory Visit: Payer: Self-pay | Admitting: *Deleted

## 2017-03-22 NOTE — Patient Outreach (Addendum)
[facility]) Care Management   03/22/2017  Krystal [name] [birth year] [id number]  Krystal [name] is an 81 y.o. female with diabetes mellitus, nephropathy, peripheral neuropathy,dyslipidemia, hypertension, and PVD. Krystal [name] was originally referred to [facility] for transition of care services after a hospital admission in December/January for hypertensive crisis. Most recently [facility] has been assisting Krystal [name] with medication management and assistance and diabetes management and education.    Subjective: "I'm still dizzy" " He told me he could not give more medicines for dizziness" " had blood clots from my vagina with burning bloody urine" " It reminds me of when I was found to have cancer years ago"  Objective:   BP 138/74 (BP Location: Right Arm, Patient Position: Sitting, Cuff Size: Large)   Pulse 78   Temp (!) 96.7 F (35.9 C) (Oral)   SpO2 98%   Review of Systems  Constitutional: Negative for chills, diaphoresis, fever, malaise/fatigue and weight loss.  HENT: Positive for sinus pain and sore throat. Negative for congestion, ear discharge, ear pain, hearing loss, nosebleeds and tinnitus.   Eyes: Positive for discharge. Negative for blurred vision, double vision, photophobia, pain and redness.  Respiratory: Negative for cough, hemoptysis, sputum production, shortness of breath, wheezing and stridor.        Nose running but clear at intervals   Cardiovascular: Positive for chest pain. Negative for palpitations, orthopnea, claudication, leg swelling and PND.       Chest pain occasionally took ntg a few days ago  But resovled  Gastrointestinal: Negative for abdominal pain, blood in stool, constipation, diarrhea, heartburn, melena, nausea and vomiting.       Last BM 02/19/17   Genitourinary: Positive for dysuria and hematuria. Negative for frequency and urgency.       Reports burning of urine tingled red Had red clots when went to rest  room   Musculoskeletal: Positive for back pain and neck pain. Negative for falls, joint pain and myalgias.       Neck pain with sinus and sore throat issues Back pain on left side (lower)  Skin: Negative for itching and rash.  Neurological: Positive for dizziness, weakness and headaches. Negative for tingling, tremors, sensory change, speech change, focal weakness, seizures and loss of consciousness.  Endo/Heme/Allergies: Negative for environmental allergies and polydipsia. Does not bruise/bleed easily.  Psychiatric/Behavioral: Negative for depression, hallucinations, memory loss, substance abuse and suicidal ideas. The patient is not nervous/anxious and does not have insomnia.     Physical Exam  Constitutional: She is oriented to person, place, and time. She appears well-developed and well-nourished.  No distress noted. She is alert and oriented x 3 and talking   HENT:  Head: Normocephalic and atraumatic.  Eyes: Conjunctivae are normal. Pupils are equal, round, and reactive to light.  Neck: Normal range of motion. Neck supple.  GI: Soft. Bowel sounds are normal.  Genitourinary: There is bleeding in the vagina. Vaginal discharge found.  Musculoskeletal: Normal range of motion.  Neurological: She is alert and oriented to person, place, and time.  Skin: Skin is warm and dry.  Psychiatric: She has a normal mood and affect. Her behavior is normal. Judgment and thought content normal.    Encounter Medications:   Outpatient Encounter Prescriptions as of 03/22/2017  Medication Sig Note  . ACCU-CHEK AVIVA PLUS test strip USE AS DIRECTED TO CHECK BLOOD SUGAR TWICE DAILY.   Marland Kitchen aspirin 81 MG EC tablet Take 81 mg by mouth every morning.    Marland Kitchen  Blood Glucose Monitoring Suppl (ACCU-CHEK AVIVA) device Use as instructed bid. E11.65   . calcitRIOL (ROCALTROL) 0.25 MCG capsule Take 0.25 mcg by mouth daily. Reported on 03/30/2016   . clopidogrel (PLAVIX) 75 MG tablet TAKE 1 TABLET ONCE DAILY.   .  cyclobenzaprine (FLEXERIL) 5 MG tablet Take 5 mg by mouth at bedtime.   . famotidine (PEPCID) 20 MG tablet Take 1 tablet (20 mg total) by mouth daily.   . febuxostat (ULORIC) 40 MG tablet Take 40 mg by mouth daily. 09/06/2016: Requested transition from bottle to blister pack beginning of November; confirmed with pharmacist @ [facility] 09/06/2016   . ferrous sulfate 325 (65 FE) MG tablet Take 325 mg by mouth daily.  09/06/2016: Requested transition from bottle to blister pack beginning of November; confirmed with pharmacist @ [facility] 09/06/2016  . Fluticasone-Salmeterol (ADVAIR DISKUS) 250-50 MCG/DOSE AEPB Inhale 1 puff into the lungs 2 (two) times daily.   Marland Kitchen HYDROcodone-acetaminophen (NORCO/VICODIN) 5-325 MG tablet Take 1 tablet by mouth every 12 (twelve) hours as needed for moderate pain.   . Insulin Aspart Prot & Aspart (NOVOLOG MIX 70/30 FLEXPEN [place]) Inject 40-60 Units into the skin 2 (two) times daily with a meal.   . insulin aspart protamine - aspart (NOVOLOG 70/30 MIX) (70-30) 100 UNIT/ML FlexPen Inject 35-50 Units into the skin 2 (two) times daily before a meal. Inject 50 units just before  breakfast if blood glucose is above 90 and inject 40 units just before  supper if blood glucose is above 90. 11/28/2016: Patient is now taking 50u qam and 40u qpm  . Insulin Pen Needle (B-D ULTRAFINE III SHORT PEN) 31G X 8 MM MISC 1 each by Does not apply route as directed.   . isosorbide mononitrate (IMDUR) 60 MG 24 hr tablet TAKE 1 TABLET ONCE DAILY.   Marland Kitchen Lancets (ACCU-CHEK SOFT TOUCH) lancets Use as instructed bid. E11.65.   Marland Kitchen levofloxacin (LEVAQUIN) 500 MG tablet Take 500 mg by mouth daily.   Marland Kitchen lisinopril (PRINIVIL,ZESTRIL) 20 MG tablet Take 1 tablet (20 mg total) by mouth daily.   . methylPREDNISolone (MEDROL) 4 MG tablet Take 4 mg by mouth daily. 11/28/2016: Completed on 11/27/16  . nitroGLYCERIN (NITROSTAT) 0.4 MG SL tablet Place 1 tablet (0.4 mg total) under the tongue every 5 (five) minutes as  needed for chest pain. 04/30/2016: Has on hand if needed.   Glory Rosebush VERIO test strip USE FOUR TIMES DAILY AS DIRECTED.   Marland Kitchen polyethylene glycol (MIRALAX / GLYCOLAX) packet Take 17 g by mouth daily as needed for mild constipation. 04/30/2016: Takes if needed  . ranolazine (RANEXA) 1000 MG SR tablet Take 1 tablet (1,000 mg total) by mouth 2 (two) times daily.   . rosuvastatin (CRESTOR) 20 MG tablet Take 1 tablet (20 mg total) by mouth every evening.   . torsemide (DEMADEX) 20 MG tablet MAY TAKE EXTRA 20MG DAILY FOR SWELLING.   . ZETIA 10 MG tablet TAKE 1 TABLET BY MOUTH EVERY MORNING.    No facility-administered encounter medications on file as of 03/22/2017.     Functional Status:   In your present state of health, do you have any difficulty performing the following activities: 04/23/2016  Hearing? N  Vision? Y  Difficulty concentrating or making decisions? N  Walking or climbing stairs? Y  Dressing or bathing? N  Doing errands, shopping? Y  Preparing Food and eating ? N  Using the Toilet? N  In the past six months, have you accidently leaked urine?  Y  Do you have problems with loss of bowel control? Y  Managing your Medications? Y  Managing your Finances? Y  Housekeeping or managing your Housekeeping? Y  Some recent data might be hidden    Fall/Depression Screening:    Fall Risk  01/31/2017 09/25/2016 05/11/2016  Falls in the past year? No No No  Number falls in past yr: - - -  Injury with Fall? 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  Mrs Royals was sitting on her living room couch and insisted on moving to her kitchen table  to complete the home visit.     Mobility:  Pt with damaged quad cane (one of four prongs without base) Mrs. Selvy still had not contacted [place] as recommended by [facility] CM for options of canes and possible delivery to home.  THN CM was able to assist with finding the tips for her cane at [place] supply store.  THN CM applied new braced on quad cane Mrs Elliott appreciative of assist with DME.  Acute medical issue Mrs Spillers whispered to [facility] CM that she had been having bloody burning urine with blood clots from her vagina on "last Friday" indicating March 15 2017.  She reports these symptoms reminds her of the symptoms she had when she was dx with uterine cancer years ago. She has not contacted any of her providers to discuss these symptoms.  THN CM discussed with Mrs Antwine the importance of contacting her medical providers when she have issues of this level.  THN CM assisted by contacting Dr Mallory Shirk [phone]) whom Mrs Dolbow reports is the Doctor who assisted her with her uterine cancer dx previously.  Dr Glo Herring office closed at 2 pm and [facility] CM spoke with after hours/on call staff, Farley Ly, Chauncey Fischer finally Jarrett Soho who recommended Mrs Hornung go to the local ED for an evaluation related to her hx, urethra and vaginal bleeding, c/o dizziness, weakness, dysuria, chest pain and headaches.  THN CM encouraged her to make a quality decision when Mrs Burkes informed THN CM "I am not bleeding today. I have errands to run today.  I would have to get some one to pick me up from the ED and I would prefer to get an appointment with Dr Glo Herring."  [facility] CM reminded her that Dr Glo Herring office does not open again until Monday 03/25/17 and if she began to bleed again she should consider going to urgent care or local ED.  Mrs Lisowski agreed she would   Medication Management:  Reviewed pt's blister packs from [place].  To confirmed she was taking all medicines. Denies  issues with Medication distribution or administration during this home visit.    Diabetes management:  Mrs Dillen reports seeing Dr Barnie Alderman on  March 11 2017 for dizziness.  She is scheduled to see Dr Dorris Fetch for her Diabetes.  Reviewed Mrs Amadon's glucometers. She has a Contour glucometer that she take  with her to appointments. THN CM demonstrated there use of the contour glucometer to ensure proper use. Mrs Lindseth states she recently obtained a new glucometer, Accu chek from [place] that would "not stay on after I put the strip in.  It flashes off."  THN CM asked her when her battery for this glucometer was changed last and she reports it should not have to be changed since she had just received it from [place].  THN CM opened there battery case for the Accu Chek to find the battery to be loose and constantly popping out. THN CM discussed this with Mrs Hoch and informed her she could take it back to [name] to report the issue, purchase another or tape down there battery which allows the glucometer to work with out issues.  THN CM was able to take Mrs Moritz cbg using there accu chek without issues Last glucometer cbg reading include Averages 7 day 230  14 day 268 30 day 239 90 day 229 03/16/17 2118   231 03/17/17 1735  158 03/17/17 1117  258 03/18/17 1009   139 03/18/17 1744  310 03/18/17 1800  97 03/18/17 2242  410  Housing: Mrs Hanger continues to have issues with noise heard by the residents living above her apartment. CM also noted increase noise from overhead residents during today's home visit and stopped by the apartment complex rental office at 1513 May 4/18 to report the noise.  The office manager stated she had informed Mrs Rodger what to do at various times and that nothing could be done if "they were not caught in the act"  Plan:  [facility] CM assisted with 4 peanut butter crackers and OJ with cbg 98 Pt states this is low for her  thn cm called oncologist Mallory Shirk to report bleeding since 03/15/26 starting with bloody burning urination and then blood clots  THN CM called [place] to request new glucometer accu chek avova plus Th pt and CM was informed that the pt would have to call the manufacturer and [name] would not replace her glucometer THN CM routed [facility] CM notes to pcp, endocrinologist, ob gyn    [facility] CM Care Plan Problem One     Most Recent Value  Care Plan Problem One  Knowledge deficit related to diabetes as evidenced by patient questions around diabetes  Role Documenting the Problem One  Care Management Coordinator  Care Plan for Problem One  Active  THN Long Term Goal (31-90 days)  Patient will verbalize understanding of diabetes management over the next 60 days  THN Long Term Goal Start Date  03/22/17  Interventions for Problem One Long Term Goal  discussed current plan of care,  reviewed upcoming appointment with CDE/RD,  reviewed MD recommendations  THN CM Short Term Goal #1 (0-30 days)  Over the next 30 days, patient will verbalize understanding of which medications are used to treat her diabetes  THN CM Short Term Goal #1 Start Date  01/24/17  [facility] CM Short Term Goal #1 Met Date  02/28/17  Interventions for Short Term Goal #1  Medication review planned for next week's home visit  THN CM Short Term Goal #2 (0-30 days)  Over the next 30 days, patient will check cbg's daily and record as prescribed  THN CM Short Term Goal #2 Start Date  01/24/17  Interventions for Short Term Goal #2  Reviewed prescribed plan to check cbg's daily,  plan review on next week's home visit  THN CM Short Term Goal #3 (0-30 days)  over the next 30 days, patient will verbalize understanding of basic components of prescribed carb modified diet  THN CM Short Term Goal #3 Start Date  01/24/17  [facility] CM Short Term Goal #3 Met Date  02/28/17  Interventions for Short Tern Goal #3  discussed carb modified diet,  prescribed [facility] educaitonal materials,  planned  detailed review for next week's home visit        Jabar Krysiak L. Lavina Hamman, RN, BSN, [facility] [phone]

## 2017-03-25 ENCOUNTER — Telehealth: Payer: Self-pay | Admitting: *Deleted

## 2017-03-25 NOTE — Patient Outreach (Signed)
[facility]) Care Management  03/25/2017  AURELIE DICENZO Mar 18, 1936 [id number]  Care Coordination  THN CM received a call from Mrs Gens to inquire about there telephone number for Dr Glo Herring to make an appointment to be seen for vaginal and urethra bleeding possibly related to pmh of uterine cancer. THN CM provided the number as [phone] and reminded Mrs Bills that [place] used her home phone to call Dr Glo Herring on Friday Mar 22 2017  Mrs Dahle denies going to the local ED and states she is still not bleeding today and prefers to see Dr Glo Herring versus seeing a local ED provider because Dr Glo Herring is more familiar than and ED provider would be with her past hx  Plans to follow up with Mrs Wassenaar this week and to see her for a thn cm home visit in  2 weeks    Viviene Thurston L. Lavina Hamman, RN, BSN, [facility] [phone]

## 2017-03-28 ENCOUNTER — Other Ambulatory Visit: Payer: Self-pay

## 2017-03-28 MED ORDER — ACCU-CHEK AVIVA DEVI: 0 refills | Status: DC

## 2017-04-01 ENCOUNTER — Ambulatory Visit: Payer: Medicare Other | Admitting: Obstetrics and Gynecology

## 2017-04-02 ENCOUNTER — Encounter: Payer: Self-pay | Admitting: Obstetrics and Gynecology

## 2017-04-02 ENCOUNTER — Other Ambulatory Visit (HOSPITAL_COMMUNITY)
Admission: RE | Admit: 2017-04-02 | Discharge: 2017-04-02 | Disposition: A | Discharge status: Home / Self Care | Payer: Medicare Other | Source: Ambulatory Visit | Attending: Obstetrics and Gynecology | Admitting: Obstetrics and Gynecology

## 2017-04-02 ENCOUNTER — Ambulatory Visit (INDEPENDENT_AMBULATORY_CARE_PROVIDER_SITE_OTHER): Payer: Medicare Other | Admitting: Obstetrics and Gynecology

## 2017-04-02 ENCOUNTER — Other Ambulatory Visit: Payer: Self-pay

## 2017-04-02 VITALS — BP 140/70 | HR 72 | Ht 65.0 in | Wt 198.0 lb

## 2017-04-02 DIAGNOSIS — N950 Postmenopausal bleeding: Secondary | ICD-10-CM

## 2017-04-02 DIAGNOSIS — I25118 Atherosclerotic heart disease of native coronary artery with other forms of angina pectoris: Secondary | ICD-10-CM

## 2017-04-02 MED ORDER — GLUCOSE BLOOD VI STRP: ORAL_STRIP | 5 refills | Status: DC

## 2017-04-03 ENCOUNTER — Encounter: Payer: Self-pay | Admitting: Cardiovascular Disease

## 2017-04-03 ENCOUNTER — Ambulatory Visit (INDEPENDENT_AMBULATORY_CARE_PROVIDER_SITE_OTHER): Payer: Medicare Other | Admitting: Cardiovascular Disease

## 2017-04-03 VITALS — BP 142/78 | HR 85 | Ht 67.0 in | Wt 199.0 lb

## 2017-04-03 DIAGNOSIS — I209 Angina pectoris, unspecified: Secondary | ICD-10-CM | POA: Diagnosis not present

## 2017-04-03 DIAGNOSIS — J438 Other emphysema: Secondary | ICD-10-CM

## 2017-04-03 DIAGNOSIS — I10 Essential (primary) hypertension: Secondary | ICD-10-CM

## 2017-04-03 DIAGNOSIS — I25118 Atherosclerotic heart disease of native coronary artery with other forms of angina pectoris: Secondary | ICD-10-CM

## 2017-04-03 DIAGNOSIS — R0609 Other forms of dyspnea: Secondary | ICD-10-CM

## 2017-04-03 DIAGNOSIS — I5032 Chronic diastolic (congestive) heart failure: Secondary | ICD-10-CM

## 2017-04-03 DIAGNOSIS — I208 Other forms of angina pectoris: Secondary | ICD-10-CM | POA: Diagnosis not present

## 2017-04-03 DIAGNOSIS — E7800 Pure hypercholesterolemia, unspecified: Secondary | ICD-10-CM | POA: Diagnosis not present

## 2017-04-03 NOTE — Progress Notes (Signed)
SUBJECTIVE: The patient presents for routine cardiovascular followup. In summary, she has a history of chronic diastolic heart failure, hypertension, coronary artery disease with multiple previously placed stents, COPD and is on chronic oxygen at home (2L), sleep apnea and uses CPAP, hyperlipidemia, and insulin-dependent diabetes mellitus.   Echocardiogram on 04/15/2015 demonstrated vigorous left ventricular systolic function, EF 08-67%, severe LVH, grade 1 diastolic dysfunction, elevated filling pressures, trivial mitral regurgitation, trivial tricuspid regurgitation, and normal pulmonary pressures of 27 mmHg.   As per Dr. Evette Georges note on 02/14/14, she has coronary artery disease dating back to the 1990s and has undergone multiple stent procedures involving the proximal LAD, circumflex, mid right coronary artery, and distal right coronary artery. Her last catheterization in October 2013 did not reveal high-grade restenosis. She did have 80-90% ostial stenosis and a small jailed first septal perforating artery. She had mild calcification and 20-30% narrowing in the stent in the circumflex vessel. The right coronary artery had 3 stents and there was 40-50% in-stent smooth narrowing with 30% narrowing in the distal stent.   Blood pressure was elevated at her last visit and I increased the dose of lisinopril.  She now only has chest pain once a week and this occurs primarily when lying down in her bed. She denies exertional chest pain. She does not have her oxygen yet but saturations are 97%.   She said she walks 10 steps and then has to stop due to shortness of breath. She does not feel this way when wearing oxygen.  She denies leg swelling.    Review of Systems: As per "subjective", otherwise negative.  Allergies  Allergen Reactions  . Morphine Shortness Of Breath and Swelling  . Penicillins Shortness Of Breath and Swelling    Has patient had a PCN reaction causing immediate rash,  facial/tongue/throat swelling, SOB or lightheadedness with hypotension: No Has patient had a PCN reaction causing severe rash involving mucus membranes or skin necrosis: No Has patient had a PCN reaction that required hospitalization Yes Has patient had a PCN reaction occurring within the last 10 years: No If all of the above answers are "NO", then may proceed with Cephalosporin use.   . Shellfish Allergy     Current Outpatient Prescriptions  Medication Sig Dispense Refill  . aspirin 81 MG EC tablet Take 81 mg by mouth every morning.     . Blood Glucose Monitoring Suppl (ACCU-CHEK AVIVA) device Use as instructed bid. E11.65 1 each 0  . calcitRIOL (ROCALTROL) 0.25 MCG capsule Take 0.25 mcg by mouth daily. Reported on 03/30/2016    . clopidogrel (PLAVIX) 75 MG tablet TAKE 1 TABLET ONCE DAILY. 30 tablet 3  . cyclobenzaprine (FLEXERIL) 5 MG tablet Take 5 mg by mouth at bedtime.    Marland Kitchen ezetimibe (ZETIA) 10 MG tablet Take 10 mg by mouth daily.    . famotidine (PEPCID) 20 MG tablet Take 1 tablet (20 mg total) by mouth daily. 30 tablet 1  . Fluticasone-Salmeterol (ADVAIR DISKUS) 250-50 MCG/DOSE AEPB Inhale 1 puff into the lungs 2 (two) times daily. 60 each 1  . glucose blood (ONETOUCH VERIO) test strip USE TWO TIMES DAILY AS DIRECTED. E11.65 100 each 5  . HYDROcodone-acetaminophen (NORCO/VICODIN) 5-325 MG tablet Take 1 tablet by mouth every 12 (twelve) hours as needed for moderate pain.    . Insulin Aspart Prot & Aspart (NOVOLOG MIX 70/30 FLEXPEN [place]) Inject 40-60 Units into the skin 2 (two) times daily with a meal.    .  insulin aspart protamine - aspart (NOVOLOG 70/30 MIX) (70-30) 100 UNIT/ML FlexPen Inject 35-50 Units into the skin 2 (two) times daily before a meal. Inject 50 units just before  breakfast if blood glucose is above 90 and inject 40 units just before  supper if blood glucose is above 90.    . Insulin Pen Needle (B-D ULTRAFINE III SHORT PEN) 31G X 8 MM MISC 1 each by Does not apply route as  directed. 100 each 3  . isosorbide mononitrate (IMDUR) 60 MG 24 hr tablet TAKE 1 TABLET ONCE DAILY. 30 tablet 3  . Lancets (ACCU-CHEK SOFT TOUCH) lancets Use as instructed bid. E11.65. 100 each 2  . lisinopril (PRINIVIL,ZESTRIL) 20 MG tablet Take 1 tablet (20 mg total) by mouth daily. 90 tablet 3  . nitroGLYCERIN (NITROSTAT) 0.4 MG SL tablet Place 1 tablet (0.4 mg total) under the tongue every 5 (five) minutes as needed for chest pain. 25 tablet 3  . polyethylene glycol (MIRALAX / GLYCOLAX) packet Take 17 g by mouth daily as needed for mild constipation. 30 each 0  . ranolazine (RANEXA) 1000 MG SR tablet Take 1 tablet (1,000 mg total) by mouth 2 (two) times daily. 60 tablet 6  . rosuvastatin (CRESTOR) 20 MG tablet Take 1 tablet (20 mg total) by mouth every evening. 30 tablet 1  . torsemide (DEMADEX) 20 MG tablet MAY TAKE EXTRA 20MG  DAILY FOR SWELLING. 30 tablet 6   No current facility-administered medications for this visit.     Past Medical History:  Diagnosis Date  . Allergic rhinitis   . CAD (coronary artery disease) 2009   5 stents- #3 in RCA, #1 each in LAD and AVG  . CHF (congestive heart failure) ([place])   . CKD (chronic kidney disease) stage 3, GFR 30-59 ml/min   . Complete heart block, transient 2014  . Diastolic dysfunction, left ventricle   . DJD (degenerative joint disease)   . Emphysema lung (HCC)    2L N/C continuously  . Essential hypertension   . GERD (gastroesophageal reflux disease)   . Glaucoma   . Gout   . History of uterine cancer   . HOH (hard of hearing)   . Hyperlipidemia   . MI (myocardial infarction) ([place]) 1998  . Microcytic anemia    History of occult blood in stool  . Myalgia   . Osteoarthritis   . Palpitations   . Personal history of colonic polyps   . Sleep apnea    CPAP  . Type II diabetes mellitus with nephropathy [facility])     Past Surgical History:  Procedure Laterality Date  . ABDOMINAL HYSTERECTOMY  04/2010   Uterine cancer,TAHBSO  .  CERVICAL DISCECTOMY     L5 left/hemilminectomy  . COLONOSCOPY  09/2006   Int hemmorhoids, COMPLICATED BY CARDIOPULMONARY COMPLICATIONS  . CORONARY ANGIOPLASTY  1/99, 1/07, 1/08, 4/09   5 cardiac stents total  . ECTOPIC PREGNANCY SURGERY    . FOOT SURGERY     Left and right for callous  . LEFT AND RIGHT HEART CATHETERIZATION WITH CORONARY ANGIOGRAM N/A 09/12/2012   Procedure: LEFT AND RIGHT HEART CATHETERIZATION WITH CORONARY ANGIOGRAM;  Surgeon: Sanda Klein, MD;  Location: [facility] CATH LAB;  Service: Cardiovascular;  Laterality: N/A;  . POLYPECTOMY  10/22/2011   Internal hemorrhoids/sessile polyp    Social History   Social History  . Marital status: Widowed    Spouse name: N/A  . Number of children: 0  . Years of education: 35   Occupational  History  .      retired   Social History Main Topics  . Smoking status: Former Smoker    Packs/day: 0.40    Years: 51.00    Types: Cigarettes    Start date: 04/22/1955    Quit date: 10/18/2006  . Smokeless tobacco: Never Used     Comment: quit about 4 yrs ago  . Alcohol use No  . Drug use: No  . Sexual activity: No   Other Topics Concern  . Not on file   Social History Narrative   Consumes 2 cups of caffeine daily     Vitals:   04/03/17 1406  BP: (!) 142/78  Pulse: 85  SpO2: 97%  Weight: 199 lb (90.3 kg)  Height: 5\' 7"  (1.702 m)    Wt Readings from Last 3 Encounters:  04/03/17 199 lb (90.3 kg)  04/02/17 198 lb (89.8 kg)  01/31/17 202 lb (91.6 kg)     PHYSICAL EXAM General: NAD HEENT: Normal. Neck: No JVD, no thyromegaly. Lungs: Diminished but clear b/l. No rales or wheezes appreciated.  CV: Nondisplaced PMI. Regular rate and rhythm, normal S1/S2, no S3/S4, no murmur. No pretibial edema.  Abdomen: Soft, nontender, no distention.  Neurologic: Alert and oriented.  Psych: Normal affect. Skin: Normal. Musculoskeletal: No gross deformities.    ECG: Most recent ECG reviewed.   Labs: Lab Results  Component  Value Date/Time   K 3.8 01/24/2017 02:36 PM   BUN 30 (H) 01/24/2017 02:36 PM   BUN 44 (H) 11/01/2014 03:09 PM   CREATININE 1.99 (H) 01/24/2017 02:36 PM   ALT 11 01/24/2017 02:36 PM   TSH 3.80 05/07/2016 10:06 AM   HGB 9.9 (L) 11/08/2015 05:47 AM     Lipids: Lab Results  Component Value Date/Time   LDLCALC 71 05/07/2016 10:06 AM   LDLDIRECT 118.4 08/13/2013 03:02 PM   CHOL 151 05/07/2016 10:06 AM   TRIG 217 (H) 05/07/2016 10:06 AM   HDL 37 (L) 05/07/2016 10:06 AM       ASSESSMENT AND PLAN: 1. Chronic diastolic heart failure: She is currently euvolemic and stable. Blood pressure is mildly elevated. Continue torsemide 20 mg. Instructed to take extra 20 mg for increased leg swelling.  2. CAD with angina: Symptoms have improved with blood pressure control. No exertional chest pain. Continue Ranexa 1000 mg twice daily along with aspirin, Plavix, Zetia, Imdur, and Crestor. Unclear why she is no longer on diastolic. She underwent a low risk normal nuclear stress test in September 2014.  3. Essential hypertension: Mildly elevated. Will monitor.  4. Hyperlipidemia: Continue current therapy with Crestor and Zetia.     Disposition: Follow up 3 months  Kate Sable, M.D., F.A.C.C.

## 2017-04-03 NOTE — Patient Instructions (Addendum)
Medication Instructions:  Your physician recommends that you continue on your current medications as directed. Please refer to the Current Medication list given to you today.   Labwork: NONE  Testing/Procedures: NONE  Follow-Up: Your physician recommends that you schedule a follow-up appointment in: 3 MONTHS    Any Other Special Instructions Will Be Listed Below (If Applicable).     If you need a refill on your cardiac medications before your next appointment, please call your pharmacy.   

## 2017-04-04 LAB — CYTOLOGY - PAP
Diagnosis: NEGATIVE
HPV: NOT DETECTED

## 2017-04-05 ENCOUNTER — Other Ambulatory Visit: Payer: Self-pay | Admitting: *Deleted

## 2017-04-05 NOTE — Patient Outreach (Signed)
[facility]) Care Management   04/05/2017  Krystal [name] [birth year] [id number]  Krystal [name] is an 81 y.o. female  with diabetes mellitus, nephropathy, peripheral neuropathy,dyslipidemia, hypertension, and PVD. Krystal [name] was originally referred to [facility] for transition of care services after a hospital admission in December/January for hypertensive crisis. Most recently [facility] has been assisting Krystal [name] with medication management and assistance and diabetes management and education.   Subjective:  "I have a headache" "Doctor Glo Herring could not examine me because I was constipated"  Objective:   BP 140/84 (BP Location: Right Arm, Patient Position: Sitting, Cuff Size: Large)   Pulse 67   Temp 98 F (36.7 C) (Oral)   Resp 20   SpO2 98%   Review of Systems  Constitutional: Negative for chills, diaphoresis, fever, malaise/fatigue and weight loss.  HENT: Negative for congestion, ear discharge, ear pain, hearing loss, nosebleeds, sinus pain, sore throat and tinnitus.   Eyes: Negative for blurred vision, double vision, photophobia, pain, discharge and redness.  Respiratory: Negative for cough, hemoptysis, sputum production, shortness of breath, wheezing and stridor.   Cardiovascular: Positive for leg swelling. Negative for chest pain, palpitations, orthopnea, claudication and PND.  Gastrointestinal: Positive for constipation. Negative for abdominal pain, blood in stool, diarrhea, heartburn, melena, nausea and vomiting.  Genitourinary: Negative for dysuria, flank pain, frequency, hematuria and urgency.  Musculoskeletal: Negative for back pain, falls, joint pain, myalgias and neck pain.  Skin: Negative for itching and rash.  Neurological: Positive for weakness and headaches. Negative for tingling, tremors, sensory change, speech change, focal weakness, seizures and loss of consciousness.  Endo/Heme/Allergies: Negative for environmental  allergies and polydipsia. Does not bruise/bleed easily.  Psychiatric/Behavioral: Negative for depression, hallucinations, memory loss, substance abuse and suicidal ideas. The patient is not nervous/anxious and does not have insomnia.     Physical Exam  Constitutional: She is oriented to person, place, and time. She appears well-developed and well-nourished.  HENT:  Head: Normocephalic and atraumatic.  Eyes: Conjunctivae are normal. Pupils are equal, round, and reactive to light.  Neck: Normal range of motion. Neck supple.  Cardiovascular: Normal rate and regular rhythm.   Respiratory: Effort normal.  GI: Soft. Bowel sounds are normal.  Musculoskeletal: She exhibits edema.  Neurological: She is alert and oriented to person, place, and time.  Skin: Skin is warm and dry.  Psychiatric: She has a normal mood and affect. Her behavior is normal. Judgment and thought content normal.    Encounter Medications:   Outpatient Encounter Prescriptions as of 04/05/2017  Medication Sig Note  . aspirin 81 MG EC tablet Take 81 mg by mouth every morning.    . Blood Glucose Monitoring Suppl (ACCU-CHEK AVIVA) device Use as instructed bid. E11.65   . calcitRIOL (ROCALTROL) 0.25 MCG capsule Take 0.25 mcg by mouth daily. Reported on 03/30/2016   . clopidogrel (PLAVIX) 75 MG tablet TAKE 1 TABLET ONCE DAILY.   . cyclobenzaprine (FLEXERIL) 5 MG tablet Take 5 mg by mouth at bedtime.   Marland Kitchen ezetimibe (ZETIA) 10 MG tablet Take 10 mg by mouth daily.   . famotidine (PEPCID) 20 MG tablet Take 1 tablet (20 mg total) by mouth daily.   . Fluticasone-Salmeterol (ADVAIR DISKUS) 250-50 MCG/DOSE AEPB Inhale 1 puff into the lungs 2 (two) times daily.   Marland Kitchen glucose blood (ONETOUCH VERIO) test strip USE TWO TIMES DAILY AS DIRECTED. E11.65   . HYDROcodone-acetaminophen (NORCO/VICODIN) 5-325 MG tablet Take 1 tablet by mouth every 12 (twelve)  hours as needed for moderate pain.   . Insulin Aspart Prot & Aspart (NOVOLOG MIX 70/30 FLEXPEN  [place]) Inject 40-60 Units into the skin 2 (two) times daily with a meal.   . insulin aspart protamine - aspart (NOVOLOG 70/30 MIX) (70-30) 100 UNIT/ML FlexPen Inject 35-50 Units into the skin 2 (two) times daily before a meal. Inject 50 units just before  breakfast if blood glucose is above 90 and inject 40 units just before  supper if blood glucose is above 90. 11/28/2016: Patient is now taking 50u qam and 40u qpm  . Insulin Pen Needle (B-D ULTRAFINE III SHORT PEN) 31G X 8 MM MISC 1 each by Does not apply route as directed.   . isosorbide mononitrate (IMDUR) 60 MG 24 hr tablet TAKE 1 TABLET ONCE DAILY.   Marland Kitchen Lancets (ACCU-CHEK SOFT TOUCH) lancets Use as instructed bid. E11.65.   Marland Kitchen lisinopril (PRINIVIL,ZESTRIL) 20 MG tablet Take 1 tablet (20 mg total) by mouth daily.   . nitroGLYCERIN (NITROSTAT) 0.4 MG SL tablet Place 1 tablet (0.4 mg total) under the tongue every 5 (five) minutes as needed for chest pain. 04/30/2016: Has on hand if needed.   . polyethylene glycol (MIRALAX / GLYCOLAX) packet Take 17 g by mouth daily as needed for mild constipation. 04/30/2016: Takes if needed  . ranolazine (RANEXA) 1000 MG SR tablet Take 1 tablet (1,000 mg total) by mouth 2 (two) times daily.   . rosuvastatin (CRESTOR) 20 MG tablet Take 1 tablet (20 mg total) by mouth every evening.   . torsemide (DEMADEX) 20 MG tablet MAY TAKE EXTRA 20MG DAILY FOR SWELLING.    No facility-administered encounter medications on file as of 04/05/2017.     Functional Status:   In your present state of health, do you have any difficulty performing the following activities: 04/23/2016  Hearing? N  Vision? Y  Difficulty concentrating or making decisions? N  Walking or climbing stairs? Y  Dressing or bathing? N  Doing errands, shopping? Y  Preparing Food and eating ? N  Using the Toilet? N  In the past six months, have you accidently leaked urine? Y  Do you have problems with loss of bowel control? Y  Managing your Medications? Y   Managing your Finances? Y  Housekeeping or managing your Housekeeping? Y  Some recent data might be hidden    Fall/Depression Screening:    Fall Risk  01/31/2017 09/25/2016 05/11/2016  Falls in the past year? No No No  Number falls in past yr: - - -  Injury with Fall? - - -  Risk Factor Category  - - -  Risk for fall due to : - - -  Risk for fall due to (comments): - - -  Follow up - - -   PHQ 2/9 Scores 01/31/2017 09/25/2016 05/11/2016 04/13/2016 03/16/2016 02/23/2016 02/01/2016  PHQ - 2 Score 0 0 0 0 0 0 0    Assessment:    Acute medical issues  headache increase BP sob during home visit Reports she was upset because she had received a call prior to [facility] CM arrival from a female stating she had called him when she could not understand how her phone had called him.  [facility] CM had also received a call from her also prior to her arrival but pt states she had not called [facility] CM.   Losing her CNA and getting another seeing CNA Dr Glo Herring to be seen next Friday Reports Dr Glo Herring her OB GYN had  not been able to examine her on their last visit because she was informed by him that she was constipated She reports since that visit she has been in her bed and "had an accident in my bed after taking my medicine" referring to miralax States she was incontinent of stool in bed as confirmed by her present personal care assistant changing of soiled linen  Chronic medical conditions cbg 84 on 5/18 0737 She was able to obtain a new glucometer from her pharmacy   Plan:  To see Krystal [name] in the next 1-2 weeks for follow up home visit Written BM elimination plan reviewed with her and placed on her refrigerator [facility] CM reviewed with pictures of the perineal how her OB GYN was able to determine that she was constipated and discussed the use of OTC meds for bowel elimination  [facility] CM Care Plan Problem One     Most Recent Value  Care Plan Problem One  Knowledge deficit related to diabetes as evidenced by patient  questions around diabetes  Role Documenting the Problem One  Care Management [place] for Problem One  Active  [facility] Long Term Goal   Patient will verbalize understanding of diabetes management over the next 60 days  [facility]N Long Term Goal Start Date  03/22/17  Interventions for Problem One Long Term Goal  discussed current plan of care,  reviewed upcoming appointment with CDE/RD,  reviewed MD recommendations  [facility]N CM Short Term Goal #1   Over the next 30 days, patient will verbalize understanding of which medications are used to treat her diabetes  [facility]N CM Short Term Goal #1 Start Date  01/24/17  [facility] CM Short Term Goal #1 Met Date  02/28/17  Interventions for Short Term Goal #1  Medication review planned for next week's home visit  THN CM Short Term Goal #2   Over the next 30 days, patient will check cbg's daily and record as prescribed  THN CM Short Term Goal #2 Start Date  01/24/17  Interventions for Short Term Goal #2  Reviewed prescribed plan to check cbg's daily,  plan review on next week's home visit  THN CM Short Term Goal #3  over the next 30 days, patient will verbalize understanding of basic components of prescribed carb modified diet  THN CM Short Term Goal #3 Start Date  01/24/17  [facility] CM Short Term Goal #3 Met Date  02/28/17  Interventions for Short Tern Goal #3  discussed carb modified diet,  prescribed Emmi educaitonal materials,  planned detailed review for next week's home visit     [place]. Lavina Hamman, RN, BSN, [place] Care Management [phone]

## 2017-04-07 NOTE — Progress Notes (Signed)
[facility] Visit  @DATE @            Patient name: Krystal [name] MRN [id number]  Date of birth: May 29, 1936  CC & HPI:  Krystal [name] is a 81 y.o. female presenting today for Bleeding which she thought was gynecologic in origin. This 81 year old female postmenopausal, status post hysterectomy noted some bleeding and had intermittent red urine subsequently. She describes himself as having passed 2 clots. He gives a history of constipation at the time of this bleeding episode and it was noted when she wiped  ROS:  ROS She's had no recurrences of the bleeding  Pertinent History Reviewed:   Reviewed: Significant for  Medical         Past Medical History:  Diagnosis Date  . Allergic rhinitis   . CAD (coronary artery disease) 2009   5 stents- #3 in RCA, #1 each in LAD and AVG  . CHF (congestive heart failure) ([place])   . CKD (chronic kidney disease) stage 3, GFR 30-59 ml/min   . Complete heart block, transient 2014  . Diastolic dysfunction, left ventricle   . DJD (degenerative joint disease)   . Emphysema lung (HCC)    2L N/C continuously  . Essential hypertension   . GERD (gastroesophageal reflux disease)   . Glaucoma   . Gout   . History of uterine cancer   . HOH (hard of hearing)   . Hyperlipidemia   . MI (myocardial infarction) ([place]) 1998  . Microcytic anemia    History of occult blood in stool  . Myalgia   . Osteoarthritis   . Palpitations   . Personal history of colonic polyps   . Sleep apnea    CPAP  . Type II diabetes mellitus with nephropathy ([place])                               Surgical Hx:    Past Surgical History:  Procedure Laterality Date  . ABDOMINAL HYSTERECTOMY  04/2010   Uterine cancer,TAHBSO  . CERVICAL DISCECTOMY     L5 left/hemilminectomy  . COLONOSCOPY  09/2006   Int hemmorhoids, COMPLICATED BY CARDIOPULMONARY COMPLICATIONS  . CORONARY ANGIOPLASTY  1/99, 1/07, 1/08, 4/09   5 cardiac stents total  . ECTOPIC PREGNANCY SURGERY    .  FOOT SURGERY     Left and right for callous  . LEFT AND RIGHT HEART CATHETERIZATION WITH CORONARY ANGIOGRAM N/A 09/12/2012   Procedure: LEFT AND RIGHT HEART CATHETERIZATION WITH CORONARY ANGIOGRAM;  Surgeon: Sanda Klein, MD;  Location: [facility] CATH LAB;  Service: Cardiovascular;  Laterality: N/A;  . POLYPECTOMY  10/22/2011   Internal hemorrhoids/sessile polyp   Medications: Reviewed & Updated - see associated section                       Current Outpatient Prescriptions:  .  aspirin 81 MG EC tablet, Take 81 mg by mouth every morning. , Disp: , Rfl:  .  Blood Glucose Monitoring Suppl (ACCU-CHEK AVIVA) device, Use as instructed bid. E11.65, Disp: 1 each, Rfl: 0 .  calcitRIOL (ROCALTROL) 0.25 MCG capsule, Take 0.25 mcg by mouth daily. Reported on 03/30/2016, Disp: , Rfl:  .  clopidogrel (PLAVIX) 75 MG tablet, TAKE 1 TABLET ONCE DAILY., Disp: 30 tablet, Rfl: 3 .  famotidine (PEPCID) 20 MG tablet, Take 1 tablet (20 mg total) by mouth daily., Disp: 30 tablet, Rfl: 1 .  Fluticasone-Salmeterol (ADVAIR DISKUS) 250-50 MCG/DOSE AEPB, Inhale 1 puff into the lungs 2 (two) times daily., Disp: 60 each, Rfl: 1 .  HYDROcodone-acetaminophen (NORCO/VICODIN) 5-325 MG tablet, Take 1 tablet by mouth every 12 (twelve) hours as needed for moderate pain., Disp: , Rfl:  .  Insulin Aspart Prot & Aspart (NOVOLOG MIX 70/30 FLEXPEN [place]), Inject 40-60 Units into the skin 2 (two) times daily with a meal., Disp: , Rfl:  .  insulin aspart protamine - aspart (NOVOLOG 70/30 MIX) (70-30) 100 UNIT/ML FlexPen, Inject 35-50 Units into the skin 2 (two) times daily before a meal. Inject 50 units just before  breakfast if blood glucose is above 90 and inject 40 units just before  supper if blood glucose is above 90., Disp: , Rfl:  .  Insulin Pen Needle (B-D ULTRAFINE III SHORT PEN) 31G X 8 MM MISC, 1 each by Does not apply route as directed., Disp: 100 each, Rfl: 3 .  isosorbide mononitrate (IMDUR) 60 MG 24 hr tablet, TAKE 1 TABLET ONCE  DAILY., Disp: 30 tablet, Rfl: 3 .  Lancets (ACCU-CHEK SOFT TOUCH) lancets, Use as instructed bid. E11.65., Disp: 100 each, Rfl: 2 .  lisinopril (PRINIVIL,ZESTRIL) 20 MG tablet, Take 1 tablet (20 mg total) by mouth daily., Disp: 90 tablet, Rfl: 3 .  nitroGLYCERIN (NITROSTAT) 0.4 MG SL tablet, Place 1 tablet (0.4 mg total) under the tongue every 5 (five) minutes as needed for chest pain., Disp: 25 tablet, Rfl: 3 .  polyethylene glycol (MIRALAX / GLYCOLAX) packet, Take 17 g by mouth daily as needed for mild constipation., Disp: 30 each, Rfl: 0 .  ranolazine (RANEXA) 1000 MG SR tablet, Take 1 tablet (1,000 mg total) by mouth 2 (two) times daily., Disp: 60 tablet, Rfl: 6 .  rosuvastatin (CRESTOR) 20 MG tablet, Take 1 tablet (20 mg total) by mouth every evening., Disp: 30 tablet, Rfl: 1 .  torsemide (DEMADEX) 20 MG tablet, MAY TAKE EXTRA 20MG  DAILY FOR SWELLING., Disp: 30 tablet, Rfl: 6 .  cyclobenzaprine (FLEXERIL) 5 MG tablet, Take 5 mg by mouth at bedtime., Disp: , Rfl:  .  ezetimibe (ZETIA) 10 MG tablet, Take 10 mg by mouth daily., Disp: , Rfl:  .  glucose blood (ONETOUCH VERIO) test strip, USE TWO TIMES DAILY AS DIRECTED. E11.65, Disp: 100 each, Rfl: 5   Social History: Reviewed -  reports that she quit smoking about 10 years ago. Her smoking use included Cigarettes. She started smoking about 62 years ago. She has a 20.40 pack-year smoking history. She has never used smokeless tobacco.  Objective Findings:  Vitals: Blood pressure 140/70, pulse 72, height 5\' 5"  (1.651 m), weight 198 lb (89.8 kg).  Physical Examination: General appearance - alert, well appearing, and in no distress, oriented to person, place, and time and normal appearing weight Mental status - alert, oriented to person, place, and time, normal mood, behavior, speech, dress, motor activity, and thought processes Abdomen - soft, nontender, nondistended, no masses or organomegaly Pelvic - normal external genitalia, vulva, vagina,  cervix, uterus and adnexa, VULVA: normal appearing vulva with no masses, tenderness or lesions, VAGINA: normal appearing vagina with normal color and discharge, no lesions, atrophic, PELVIC FLOOR EXAM: no cystocele, rectocele or prolapse noted, CERVIX: surgically absent, UTERUS: surgically absent, vaginal cuff well healed, exam chaperoned by nurse, anus inspected and no lesions seen skin tags present no active bleeding at this time   Assessment & Plan:   A:  1. GYN bleeding is not present 2. I suspect  bleeding from external hemorrhoid/skin tag and her constipation  P:  1. I've informed patient of mine normal findings and that there is no active GYN lesions. If the bleeding recurs she shouldn't have evaluation with primary care physician or gastroenterology

## 2017-04-10 ENCOUNTER — Ambulatory Visit (INDEPENDENT_AMBULATORY_CARE_PROVIDER_SITE_OTHER): Payer: Medicare Other | Admitting: Obstetrics and Gynecology

## 2017-04-10 ENCOUNTER — Encounter: Payer: Self-pay | Admitting: Obstetrics and Gynecology

## 2017-04-10 VITALS — BP 142/86 | HR 93 | Wt 197.8 lb

## 2017-04-10 DIAGNOSIS — R809 Proteinuria, unspecified: Secondary | ICD-10-CM

## 2017-04-10 DIAGNOSIS — I25118 Atherosclerotic heart disease of native coronary artery with other forms of angina pectoris: Secondary | ICD-10-CM | POA: Diagnosis not present

## 2017-04-10 LAB — POCT URINALYSIS DIPSTICK
Glucose, UA: NEGATIVE
KETONES UA: NEGATIVE
LEUKOCYTES UA: NEGATIVE
Nitrite, UA: NEGATIVE
RBC UA: NEGATIVE

## 2017-04-10 NOTE — Progress Notes (Signed)
Patient ID: Krystal [name], female   DOB: 12/04/1935, 81 y.o.   MRN: [id number]    [facility] Visit  @DATE @            Patient name: Krystal [name] MRN [id number]  Date of birth: 01/27/1936  CC & HPI:   Chief Complaint  Patient presents with  . Follow-up     Krystal [name] is a 81 y.o. female presenting today for f/u of constipation. Pt was seen in the office last week and was noted to be constipated on exam. She states she went home, took a stool softener and was able to successfully defecate. Pt does wonder why this issue doesn't cause her pain.   ROS:  ROS +intermittent constipation   Pertinent History Reviewed:   Reviewed: Significant for DM, CKD 3, uterine cancer, abdominal hysterectomy  Medical         Past Medical History:  Diagnosis Date  . Allergic rhinitis   . CAD (coronary artery disease) 2009   5 stents- #3 in RCA, #1 each in LAD and AVG  . CHF (congestive heart failure) ([place])   . CKD (chronic kidney disease) stage 3, GFR 30-59 ml/min   . Complete heart block, transient 2014  . Diastolic dysfunction, left ventricle   . DJD (degenerative joint disease)   . Emphysema lung (HCC)    2L N/C continuously  . Essential hypertension   . GERD (gastroesophageal reflux disease)   . Glaucoma   . Gout   . History of uterine cancer   . HOH (hard of hearing)   . Hyperlipidemia   . MI (myocardial infarction) ([place]) 1998  . Microcytic anemia    History of occult blood in stool  . Myalgia   . Osteoarthritis   . Palpitations   . Personal history of colonic polyps   . Sleep apnea    CPAP  . Type II diabetes mellitus with nephropathy ([place])                               Surgical Hx:    Past Surgical History:  Procedure Laterality Date  . ABDOMINAL HYSTERECTOMY  04/2010   Uterine cancer,TAHBSO  . CERVICAL DISCECTOMY     L5 left/hemilminectomy  . COLONOSCOPY  09/2006   Int hemmorhoids, COMPLICATED BY CARDIOPULMONARY COMPLICATIONS  . CORONARY ANGIOPLASTY   1/99, 1/07, 1/08, 4/09   5 cardiac stents total  . ECTOPIC PREGNANCY SURGERY    . FOOT SURGERY     Left and right for callous  . LEFT AND RIGHT HEART CATHETERIZATION WITH CORONARY ANGIOGRAM N/A 09/12/2012   Procedure: LEFT AND RIGHT HEART CATHETERIZATION WITH CORONARY ANGIOGRAM;  Surgeon: Sanda Klein, MD;  Location: [facility] CATH LAB;  Service: Cardiovascular;  Laterality: N/A;  . POLYPECTOMY  10/22/2011   Internal hemorrhoids/sessile polyp   Medications: Reviewed & Updated - see associated section                       Current Outpatient Prescriptions:  .  aspirin 81 MG EC tablet, Take 81 mg by mouth every morning. , Disp: , Rfl:  .  Blood Glucose Monitoring Suppl (ACCU-CHEK AVIVA) device, Use as instructed bid. E11.65, Disp: 1 each, Rfl: 0 .  calcitRIOL (ROCALTROL) 0.25 MCG capsule, Take 0.25 mcg by mouth daily. Reported on 03/30/2016, Disp: , Rfl:  .  clopidogrel (PLAVIX) 75 MG tablet, TAKE 1 TABLET ONCE  DAILY., Disp: 30 tablet, Rfl: 3 .  ezetimibe (ZETIA) 10 MG tablet, Take 10 mg by mouth daily., Disp: , Rfl:  .  famotidine (PEPCID) 20 MG tablet, Take 1 tablet (20 mg total) by mouth daily., Disp: 30 tablet, Rfl: 1 .  Fluticasone-Salmeterol (ADVAIR DISKUS) 250-50 MCG/DOSE AEPB, Inhale 1 puff into the lungs 2 (two) times daily., Disp: 60 each, Rfl: 1 .  glucose blood (ONETOUCH VERIO) test strip, USE TWO TIMES DAILY AS DIRECTED. E11.65, Disp: 100 each, Rfl: 5 .  Insulin Aspart Prot & Aspart (NOVOLOG MIX 70/30 FLEXPEN ), Inject 40-60 Units into the skin 2 (two) times daily with a meal., Disp: , Rfl:  .  insulin aspart protamine - aspart (NOVOLOG 70/30 MIX) (70-30) 100 UNIT/ML FlexPen, Inject 35-50 Units into the skin 2 (two) times daily before a meal. Inject 50 units just before  breakfast if blood glucose is above 90 and inject 40 units just before  supper if blood glucose is above 90., Disp: , Rfl:  .  Insulin Pen Needle (B-D ULTRAFINE III SHORT PEN) 31G X 8 MM MISC, 1 each by Does not apply  route as directed., Disp: 100 each, Rfl: 3 .  isosorbide mononitrate (IMDUR) 60 MG 24 hr tablet, TAKE 1 TABLET ONCE DAILY., Disp: 30 tablet, Rfl: 3 .  Lancets (ACCU-CHEK SOFT TOUCH) lancets, Use as instructed bid. E11.65., Disp: 100 each, Rfl: 2 .  lisinopril (PRINIVIL,ZESTRIL) 20 MG tablet, Take 1 tablet (20 mg total) by mouth daily., Disp: 90 tablet, Rfl: 3 .  nitroGLYCERIN (NITROSTAT) 0.4 MG SL tablet, Place 1 tablet (0.4 mg total) under the tongue every 5 (five) minutes as needed for chest pain., Disp: 25 tablet, Rfl: 3 .  polyethylene glycol (MIRALAX / GLYCOLAX) packet, Take 17 g by mouth daily as needed for mild constipation., Disp: 30 each, Rfl: 0 .  ranolazine (RANEXA) 1000 MG SR tablet, Take 1 tablet (1,000 mg total) by mouth 2 (two) times daily., Disp: 60 tablet, Rfl: 6 .  rosuvastatin (CRESTOR) 20 MG tablet, Take 1 tablet (20 mg total) by mouth every evening., Disp: 30 tablet, Rfl: 1 .  torsemide (DEMADEX) 20 MG tablet, MAY TAKE EXTRA 20MG  DAILY FOR SWELLING., Disp: 30 tablet, Rfl: 6   Social History: Reviewed -  reports that she quit smoking about 10 years ago. Her smoking use included Cigarettes. She started smoking about 62 years ago. She has a 20.40 pack-year smoking history. She has never used smokeless tobacco.  Objective Findings:  Vitals: Blood pressure (!) 142/86, pulse 93, weight 197 lb 12.8 oz (89.7 kg).  Physical Examination: General appearance - alert, well appearing, and in no distress Mental status - alert, oriented to person, place, and time Abdomen - soft, nontender, nondistended, no masses or organomegaly Pelvic -  VULVA: normal appearing vulva with no masses, tenderness or lesions,  VAGINA: normal appearing vagina with normal color and discharge, no lesions, healthy appearing tissues, well healed vaginal cuff    Assessment & Plan:   A:  1. Constipation 2. +3 protein on UA, sent for culture to r/o infection  3. Bleeding with no apparent GYN etiology   P:   1. F/u with nephrologist for further evaluation  2. Continue with stool softeners PRN 3. F/u here PRN     By signing my name below, I, Hansel Feinstein, attest that this documentation has been prepared under the direction and in the presence of Jonnie Kind, MD. Electronically Signed: Hansel Feinstein, ED Scribe. 04/10/17. 2:46 PM.  I personally performed the services described in this documentation, which was SCRIBED in my presence. The recorded information has been reviewed and considered accurate. It has been edited as necessary during review. Jonnie Kind, MD

## 2017-04-16 DIAGNOSIS — I10 Essential (primary) hypertension: Secondary | ICD-10-CM | POA: Diagnosis not present

## 2017-04-16 DIAGNOSIS — I951 Orthostatic hypotension: Secondary | ICD-10-CM | POA: Diagnosis not present

## 2017-04-16 DIAGNOSIS — G4733 Obstructive sleep apnea (adult) (pediatric): Secondary | ICD-10-CM | POA: Diagnosis not present

## 2017-04-16 DIAGNOSIS — J449 Chronic obstructive pulmonary disease, unspecified: Secondary | ICD-10-CM | POA: Diagnosis not present

## 2017-04-19 ENCOUNTER — Other Ambulatory Visit: Payer: Self-pay | Admitting: *Deleted

## 2017-04-19 DIAGNOSIS — R809 Proteinuria, unspecified: Secondary | ICD-10-CM | POA: Diagnosis not present

## 2017-04-19 DIAGNOSIS — N184 Chronic kidney disease, stage 4 (severe): Secondary | ICD-10-CM | POA: Diagnosis not present

## 2017-04-19 DIAGNOSIS — R319 Hematuria, unspecified: Secondary | ICD-10-CM | POA: Diagnosis not present

## 2017-04-19 NOTE — Patient Outreach (Signed)
[facility]) Care Management  04/19/2017  MARLYN TONDREAU 1936/02/06 [id number]  Care Coordination [facility] CM called Mrs Dini at 1300 and 1321.  Had a scheduled [facility] CM home visit.  Her telephone rang but no answer and voicemessage stated the voice mail box for the [employer] customer is not set up. Noted Mrs Andre was having issues with her telephone on last home visit, therefore [facility] CM stopped by and knocked on her door of her apartment and there was not an answer There is not a cell phone listed in EPIC for [name]  Mrs Kops arrived home at 37 with her [facility] CNA after being out running errands Discussed her telephone issues noted   Plan [facility] CM will continue to follow up with her at her home and see her in 1-2 weeks  Kimberly L. Lavina Hamman, RN, BSN, [place] Care Management [phone]

## 2017-04-19 NOTE — Patient Outreach (Signed)
[name]) Care Management   04/19/2017  Krystal [name] 1936/04/03 [id number]  Krystal [name] is an 81 y.o. female with diabetes mellitus, nephropathy, peripheral neuropathy,dyslipidemia, hypertension, and PVD. Krystal [name] was originally referred to [facility] for transition of care services after a hospital admission in December/January for hypertensive crisis. Most recently [facility] has been assisting Krystal [name] with medication management and assistance and diabetes management and education.   Subjective:   Objective:   Review of Systems  Constitutional: Negative for chills, diaphoresis, fever, malaise/fatigue and weight loss.  HENT: Negative for congestion, ear discharge, ear pain, hearing loss, nosebleeds, sinus pain, sore throat and tinnitus.   Eyes: Negative for blurred vision, double vision, photophobia, pain, discharge and redness.  Respiratory: Positive for shortness of breath. Negative for cough, hemoptysis, sputum production, wheezing and stridor.   Cardiovascular: Negative for chest pain, palpitations, orthopnea, claudication, leg swelling and PND.  Gastrointestinal: Negative for abdominal pain, blood in stool, constipation, diarrhea, heartburn, melena, nausea and vomiting.  Genitourinary: Negative for dysuria, flank pain, frequency, hematuria and urgency.  Musculoskeletal: Negative for back pain, falls, joint pain, myalgias and neck pain.  Skin: Negative for itching and rash.  Neurological: Positive for dizziness. Negative for tingling, tremors, sensory change, speech change, focal weakness, seizures, loss of consciousness, weakness and headaches.  Endo/Heme/Allergies: Negative for environmental allergies and polydipsia. Does not bruise/bleed easily.  Psychiatric/Behavioral: Negative for depression, hallucinations, memory loss, substance abuse and suicidal ideas. The patient is not nervous/anxious and does not have insomnia.      Physical Exam  Constitutional: She is oriented to person, place, and time. She appears well-developed and well-nourished.  HENT:  Head: Normocephalic and atraumatic.  Eyes: Conjunctivae are normal. Pupils are equal, round, and reactive to light.  Neck: Normal range of motion. Neck supple.  Cardiovascular: Normal rate, regular rhythm, normal heart sounds and intact distal pulses.   Respiratory: Effort normal and breath sounds normal.  GI: Soft.  Musculoskeletal: Normal range of motion.  Neurological: She is alert and oriented to person, place, and time.  Skin: Skin is warm and dry.  Psychiatric: She has a normal mood and affect. Her behavior is normal. Judgment and thought content normal.    Encounter Medications:   Outpatient Encounter Prescriptions as of 04/19/2017  Medication Sig Note  . aspirin 81 MG EC tablet Take 81 mg by mouth every morning.    . Blood Glucose Monitoring Suppl (ACCU-CHEK AVIVA) device Use as instructed bid. E11.65   . calcitRIOL (ROCALTROL) 0.25 MCG capsule Take 0.25 mcg by mouth daily. Reported on 03/30/2016   . clopidogrel (PLAVIX) 75 MG tablet TAKE 1 TABLET ONCE DAILY.   Marland Kitchen ezetimibe (ZETIA) 10 MG tablet Take 10 mg by mouth daily.   . famotidine (PEPCID) 20 MG tablet Take 1 tablet (20 mg total) by mouth daily.   . Fluticasone-Salmeterol (ADVAIR DISKUS) 250-50 MCG/DOSE AEPB Inhale 1 puff into the lungs 2 (two) times daily.   Marland Kitchen glucose blood (ONETOUCH VERIO) test strip USE TWO TIMES DAILY AS DIRECTED. E11.65   . Insulin Aspart Prot & Aspart (NOVOLOG MIX 70/30 FLEXPEN [place]) Inject 40-60 Units into the skin 2 (two) times daily with a meal.   . insulin aspart protamine - aspart (NOVOLOG 70/30 MIX) (70-30) 100 UNIT/ML FlexPen Inject 35-50 Units into the skin 2 (two) times daily before a meal. Inject 50 units just before  breakfast if blood glucose is above 90 and inject 40 units just before  supper  if blood glucose is above 90. 11/28/2016: Patient is now taking 50u qam  and 40u qpm  . Insulin Pen Needle (B-D ULTRAFINE III SHORT PEN) 31G X 8 MM MISC 1 each by Does not apply route as directed.   . isosorbide mononitrate (IMDUR) 60 MG 24 hr tablet TAKE 1 TABLET ONCE DAILY.   Marland Kitchen Lancets (ACCU-CHEK SOFT TOUCH) lancets Use as instructed bid. E11.65.   Marland Kitchen lisinopril (PRINIVIL,ZESTRIL) 20 MG tablet Take 1 tablet (20 mg total) by mouth daily.   . nitroGLYCERIN (NITROSTAT) 0.4 MG SL tablet Place 1 tablet (0.4 mg total) under the tongue every 5 (five) minutes as needed for chest pain. 04/30/2016: Has on hand if needed.   . polyethylene glycol (MIRALAX / GLYCOLAX) packet Take 17 g by mouth daily as needed for mild constipation. 04/30/2016: Takes if needed  . ranolazine (RANEXA) 1000 MG SR tablet Take 1 tablet (1,000 mg total) by mouth 2 (two) times daily.   . rosuvastatin (CRESTOR) 20 MG tablet Take 1 tablet (20 mg total) by mouth every evening.   . torsemide (DEMADEX) 20 MG tablet MAY TAKE EXTRA 20MG DAILY FOR SWELLING.    No facility-administered encounter medications on file as of 04/19/2017.     Functional Status:   In your present state of health, do you have any difficulty performing the following activities: 04/23/2016  Hearing? N  Vision? Y  Difficulty concentrating or making decisions? N  Walking or climbing stairs? Y  Dressing or bathing? N  Doing errands, shopping? Y  Preparing Food and eating ? N  Using the Toilet? N  In the past six months, have you accidently leaked urine? Y  Do you have problems with loss of bowel control? Y  Managing your Medications? Y  Managing your Finances? Y  Housekeeping or managing your Housekeeping? Y  Some recent data might be hidden    Fall/Depression Screening:    Fall Risk  01/31/2017 09/25/2016 05/11/2016  Falls in the past year? No No No  Number falls in past yr: - - -  Injury with Fall? - - -  Risk Factor Category  - - -  Risk for fall due to : - - -  Risk for fall due to (comments): - - -  Follow up - - -   PHQ 2/9  Scores 01/31/2017 09/25/2016 05/11/2016 04/13/2016 03/16/2016 02/23/2016 02/01/2016  PHQ - 2 Score 0 0 0 0 0 0 0    Assessment:    Krystal [name] presents today coming in from running errands and having lab work done after fasting.  She was given the opportunity to get settled and eat a meal.  She was noted to have pancakes and sausage from [employer] with a cup of coffee  Krystal [name] is still preparing to move to another apartment in her complex   Acute Medical Conditions:  Bowel elimination- reports better stools since she has started taking pericolace on a routine basis.  Reports good results Takes twice a day Added to Medication list   Chronic Medical Conditions (DM, chronic dizziness, HTN) She has seen Dr Luan Pulling recently and prescribed Antivert and states it is helping. Added to medication list Continues with elevations in cbgs  As evidence by her writing down each value on sheet for Dr Dorris Fetch daily as ordered and glucometer averages and BP Encouraged to follow her DM diet and to take her HTN Medication as ordered Pt reports not taking her BP medication today because she was fasting for labs  Medications/DME- [facility] CM assisted showing her how to use her new smaller oxygen tank and encourage further question to call her DME company staff [facility] Cm provided Krystal [name] with 4 copies of her present medication lists to placed in her purse to share with providers during appointments as requested  Plan: follow up 1- 2 weeks for home visit Assisted with establishing voice mail box for home phone and place home number on the do not call registry to decrease telemarketing excess calls  [facility] to route note to pcp (Hill) and CV ([email or web address]), Endocrinologist, [facility] CM Care Plan Problem One     Most Recent Value  Care Plan Problem One  (P) Knowledge deficit related to diabetes as evidenced by patient questions around diabetes  Role Documenting the Problem One  (P) Care Management [place] for  Problem One  (P) Active  THN Long Term Goal   (P) Patient will verbalize understanding of diabetes management over the next 60 days  THN Long Term Goal Start Date  (P) 03/22/17  Interventions for Problem One Long Term Goal  (P) discussed current plan of care,  reviewed upcoming appointment with CDE/RD,  reviewed MD recommendations  THN CM Short Term Goal #1   (P) Over the next 30 days, patient will verbalize understanding of which medications are used to treat her diabetes  THN CM Short Term Goal #1 Start Date  (P) 01/24/17  THN CM Short Term Goal #1 Met Date  (P) 02/28/17  Interventions for Short Term Goal #1  (P) Medication review planned for next week's home visit  THN CM Short Term Goal #2   (P) Over the next 30 days, patient will check cbg's daily and record as prescribed  THN CM Short Term Goal #2 Start Date  (P) 01/24/17  Interventions for Short Term Goal #2  (P) Reviewed prescribed plan to check cbg's daily,  plan review on next week's home visit  THN CM Short Term Goal #3  (P) over the next 30 days, patient will verbalize understanding of basic components of prescribed carb modified diet  THN CM Short Term Goal #3 Start Date  (P) 01/24/17  THN CM Short Term Goal #3 Met Date  (P) 02/28/17  Interventions for Short Tern Goal #3  (P) discussed carb modified diet,  prescribed Emmi educaitonal materials,  planned detailed review for next week's home visit        [place]. Lavina Hamman, RN, BSN, [place] [facility] [phone]

## 2017-04-29 ENCOUNTER — Other Ambulatory Visit (HOSPITAL_COMMUNITY): Payer: Self-pay | Admitting: Family Medicine

## 2017-04-29 DIAGNOSIS — R310 Gross hematuria: Secondary | ICD-10-CM

## 2017-04-30 ENCOUNTER — Telehealth: Payer: Self-pay | Admitting: *Deleted

## 2017-04-30 NOTE — Telephone Encounter (Signed)
Pt notified of enrollment into [employer]. Current balance is $800. Reimbursements are for the dates of 02/29/16-05/28/17. Patient given the number to [employer] [phone] to call for directions.

## 2017-05-01 DIAGNOSIS — R3129 Other microscopic hematuria: Secondary | ICD-10-CM | POA: Diagnosis not present

## 2017-05-01 DIAGNOSIS — R809 Proteinuria, unspecified: Secondary | ICD-10-CM | POA: Diagnosis not present

## 2017-05-03 ENCOUNTER — Ambulatory Visit (HOSPITAL_COMMUNITY)
Admission: RE | Admit: 2017-05-03 | Discharge: 2017-05-03 | Disposition: A | Discharge status: Home / Self Care | Payer: Medicare Other | Source: Ambulatory Visit | Attending: Family Medicine | Admitting: Family Medicine

## 2017-05-03 DIAGNOSIS — R310 Gross hematuria: Secondary | ICD-10-CM | POA: Insufficient documentation

## 2017-05-03 DIAGNOSIS — N183 Chronic kidney disease, stage 3 (moderate): Secondary | ICD-10-CM | POA: Diagnosis not present

## 2017-05-07 DIAGNOSIS — E1042 Type 1 diabetes mellitus with diabetic polyneuropathy: Secondary | ICD-10-CM | POA: Diagnosis not present

## 2017-05-07 DIAGNOSIS — R310 Gross hematuria: Secondary | ICD-10-CM | POA: Diagnosis not present

## 2017-05-07 DIAGNOSIS — E1165 Type 2 diabetes mellitus with hyperglycemia: Secondary | ICD-10-CM | POA: Diagnosis not present

## 2017-05-08 ENCOUNTER — Ambulatory Visit: Payer: Medicare Other | Admitting: Nutrition

## 2017-05-08 ENCOUNTER — Other Ambulatory Visit: Payer: Self-pay

## 2017-05-08 ENCOUNTER — Ambulatory Visit: Payer: Medicare Other | Admitting: "Endocrinology

## 2017-05-08 MED ORDER — GLUCOSE BLOOD VI STRP: ORAL_STRIP | 5 refills | Status: DC

## 2017-05-08 NOTE — Telephone Encounter (Signed)
Pt is needing Bg test strip refill approved.

## 2017-05-14 ENCOUNTER — Other Ambulatory Visit: Payer: Self-pay | Admitting: *Deleted

## 2017-05-15 ENCOUNTER — Other Ambulatory Visit: Payer: Self-pay

## 2017-05-15 MED ORDER — GLUCOSE BLOOD VI STRP: ORAL_STRIP | 5 refills | Status: DC

## 2017-05-17 ENCOUNTER — Other Ambulatory Visit: Payer: Self-pay | Admitting: *Deleted

## 2017-05-17 NOTE — Patient Outreach (Signed)
[facility]) Care Management   05/17/2017  BOYD LITAKER 1936-01-30 [id number]  Krystal [name] is an 81 y.o. female with diabetes mellitus, nephropathy, peripheral neuropathy,dyslipidemia, hypertension, and PVD. Krystal [name] was originally referred to [facility] for transition of care services after a hospital admission in December/January for hypertensive crisis. Most recently [facility] has been assisting Krystal [name] with medication management and assistance and diabetes management and education.   This is the sixth [facility]N CM home visit.   Subjective: "Ive been nervous" "  Objective:   Review of Systems  Constitutional: Negative for chills, diaphoresis, fever, malaise/fatigue and weight loss.  HENT: Negative for congestion, ear discharge, ear pain, hearing loss, nosebleeds, sinus pain, sore throat and tinnitus.   Eyes: Negative for blurred vision, double vision, photophobia, pain, discharge and redness.  Respiratory: Positive for shortness of breath. Negative for cough, hemoptysis, sputum production, wheezing and stridor.   Cardiovascular: Negative for chest pain, palpitations, orthopnea, claudication, leg swelling and PND.  Gastrointestinal: Negative for abdominal pain, blood in stool, constipation, diarrhea, heartburn, melena, nausea and vomiting.  Genitourinary: Negative for dysuria, flank pain, frequency, hematuria and urgency.  Musculoskeletal: Negative for back pain, falls, joint pain, myalgias and neck pain.  Skin: Negative for itching and rash.  Neurological: Positive for dizziness and weakness. Negative for tingling, tremors, sensory change, speech change, focal weakness, seizures, loss of consciousness and headaches.  Endo/Heme/Allergies: Negative for environmental allergies and polydipsia. Does not bruise/bleed easily.  Psychiatric/Behavioral: Negative for depression, hallucinations, substance abuse and suicidal ideas. The patient is not  nervous/anxious and does not have insomnia.     Physical Exam  Constitutional: She is oriented to person, place, and time. She appears well-developed and well-nourished.  HENT:  Head: Normocephalic and atraumatic.  Right Ear: External ear normal.  Left Ear: External ear normal.  Eyes: Conjunctivae are normal. Pupils are equal, round, and reactive to light.  Neck: Normal range of motion. Neck supple.  Cardiovascular: Normal rate, regular rhythm, normal heart sounds and intact distal pulses.   Respiratory: Effort normal and breath sounds normal.  GI: Soft. Bowel sounds are normal.  Musculoskeletal: Normal range of motion.  Neurological: She is alert and oriented to person, place, and time.  Skin: Skin is warm and dry.  Psychiatric: She has a normal mood and affect. Her behavior is normal. Judgment and thought content normal.    Encounter Medications:   Outpatient Encounter Prescriptions as of 05/17/2017  Medication Sig Note  . aspirin 81 MG EC tablet Take 81 mg by mouth every morning.    . Blood Glucose Monitoring Suppl (ACCU-CHEK AVIVA) device Use as instructed bid. E11.65   . calcitRIOL (ROCALTROL) 0.25 MCG capsule Take 0.25 mcg by mouth daily. Reported on 03/30/2016   . clopidogrel (PLAVIX) 75 MG tablet TAKE 1 TABLET ONCE DAILY.   Marland Kitchen ezetimibe (ZETIA) 10 MG tablet Take 10 mg by mouth daily.   . famotidine (PEPCID) 20 MG tablet Take 1 tablet (20 mg total) by mouth daily.   . Fluticasone-Salmeterol (ADVAIR DISKUS) 250-50 MCG/DOSE AEPB Inhale 1 puff into the lungs 2 (two) times daily.   Marland Kitchen glucose blood (ONETOUCH VERIO) test strip USE TWO TIMES DAILY AS DIRECTED. E11.65   . Insulin Aspart Prot & Aspart (NOVOLOG MIX 70/30 FLEXPEN [place]) Inject 40-60 Units into the skin 2 (two) times daily with a meal.   . insulin aspart protamine - aspart (NOVOLOG 70/30 MIX) (70-30) 100 UNIT/ML FlexPen Inject 35-50 Units into the skin  2 (two) times daily before a meal. Inject 50 units just before  breakfast if  blood glucose is above 90 and inject 40 units just before  supper if blood glucose is above 90. 11/28/2016: Patient is now taking 50u qam and 40u qpm  . Insulin Pen Needle (B-D ULTRAFINE III SHORT PEN) 31G X 8 MM MISC 1 each by Does not apply route as directed.   . isosorbide mononitrate (IMDUR) 60 MG 24 hr tablet TAKE 1 TABLET ONCE DAILY.   Marland Kitchen Lancets (ACCU-CHEK SOFT TOUCH) lancets Use as instructed bid. E11.65.   Marland Kitchen lisinopril (PRINIVIL,ZESTRIL) 20 MG tablet Take 1 tablet (20 mg total) by mouth daily.   . meclizine (ANTIVERT) 25 MG tablet Take 25 mg by mouth 4 (four) times daily as needed for dizziness.   . nitroGLYCERIN (NITROSTAT) 0.4 MG SL tablet Place 1 tablet (0.4 mg total) under the tongue every 5 (five) minutes as needed for chest pain. 04/30/2016: Has on hand if needed.   . polyethylene glycol (MIRALAX / GLYCOLAX) packet Take 17 g by mouth daily as needed for mild constipation. 04/30/2016: Takes if needed  . ranolazine (RANEXA) 1000 MG SR tablet Take 1 tablet (1,000 mg total) by mouth 2 (two) times daily.   . rosuvastatin (CRESTOR) 20 MG tablet Take 1 tablet (20 mg total) by mouth every evening.   . sennosides-docusate sodium (SENOKOT-S) 8.6-50 MG tablet Take 1 tablet by mouth 2 (two) times daily before a meal.   . torsemide (DEMADEX) 20 MG tablet MAY TAKE EXTRA 20MG DAILY FOR SWELLING.    No facility-administered encounter medications on file as of 05/17/2017.     Functional Status:   No flowsheet data found.  Fall/Depression Screening:    Fall Risk  01/31/2017 09/25/2016 05/11/2016  Falls in the past year? No No No  Number falls in past yr: - - -  Injury with Fall? - - -  Risk Factor Category  - - -  Risk for fall due to : - - -  Risk for fall due to (comments): - - -  Follow up - - -   PHQ 2/9 Scores 01/31/2017 09/25/2016 05/11/2016 04/13/2016 03/16/2016 02/23/2016 02/01/2016  PHQ - 2 Score 0 0 0 0 0 0 0    Assessment:   Diabetes cbg 109 this and she reports she felt nervous today with  a cbg of cbg 232 and reports she felt tire,sleepy and had not eating lunch only breakfast. She also is having issues since her cpap is not working   COPD  CPAP machine broken, machine comes on but not circulating water-has to have another sleep  [facility] with supplying her new CPAP since she has other DME supplied by [facility]  Dr Luan Pulling office is ordering new sleep study [facility] CM and Mrs Gorgas contacted astaff at [facility] about her [place] [address] to confirm that this CPAP machine is no longer being made therefore can not be repaired. Recommendation is for Mrs Crunkleton to get a new CPAP which after 5 years she will need a new sleep study completed to get one.   [facility] CM and CM contacted [place] sleep center without answers at [phone] and [phone]. Confirmed same numbers with [facility] hospital staff. Left message at [place] center [phone]  for a return call to [facility] CM.  Mrs Vollrath to attempt  Contact sleep study center Left her all the contact numbers  Mattress need replaced -Assisted with completion of form  Plan:   See Mrs Rossie in 2 weeks for home visit .     THN CM Care Plan Problem One     Most Recent Value  Care Plan Problem One  (P) Knowledge deficit related to diabetes as evidenced by patient questions around diabetes  Role Documenting the Problem One  (P) Care Management [place] for Problem One  (P) Active  THN Long Term Goal   (P) Patient will verbalize understanding of diabetes management over the next 60 days  THN Long Term Goal Start Date  (P) 03/22/17  Interventions for Problem One Long Term Goal  (P) discussed current plan of care,  reviewed upcoming appointment with CDE/RD,  reviewed MD recommendations  THN CM Short Term Goal #1   (P) Over the next 30 days, patient will verbalize understanding of which medications are used to treat her diabetes  THN CM Short Term Goal #1 Start Date  (P) 01/24/17  THN CM  Short Term Goal #1 Met Date  (P) 02/28/17  Interventions for Short Term Goal #1  (P) Medication review planned for next week's home visit  THN CM Short Term Goal #2   (P) Over the next 30 days, patient will check cbg's daily and record as prescribed  THN CM Short Term Goal #2 Start Date  (P) 01/24/17  Interventions for Short Term Goal #2  (P) Reviewed prescribed plan to check cbg's daily,  plan review on next week's home visit  THN CM Short Term Goal #3  (P) over the next 30 days, patient will verbalize understanding of basic components of prescribed carb modified diet  THN CM Short Term Goal #3 Start Date  (P) 01/24/17  THN CM Short Term Goal #3 Met Date  (P) 02/28/17  Interventions for Short Tern Goal #3  (P) discussed carb modified diet,  prescribed Emmi educaitonal materials,  planned detailed review for next week's home visit     [place]. Lavina Hamman, RN, BSN, [place] [facility] [phone]

## 2017-05-27 ENCOUNTER — Encounter (HOSPITAL_BASED_OUTPATIENT_CLINIC_OR_DEPARTMENT_OTHER): Payer: Self-pay

## 2017-05-27 DIAGNOSIS — G4733 Obstructive sleep apnea (adult) (pediatric): Secondary | ICD-10-CM

## 2017-05-31 ENCOUNTER — Other Ambulatory Visit: Payer: Self-pay | Admitting: *Deleted

## 2017-05-31 NOTE — Patient Outreach (Signed)
Krystal [name]) Care Management   05/31/2017  Krystal [name] 1936-10-24 [id number]  Krystal [name] is an 81 y.o. female with diabetes mellitus, nephropathy, peripheral neuropathy,dyslipidemia, hypertension, and PVD. Krystal [name] was originally referred to [facility] for transition of care services after a [name] admission in December/January for hypertensive crisis. Most recently [place] [facility] has been assisting Krystal [name] with medication management and assistance and diabetes management and education.   This is the seventh [facility] CM home visit.  Subjective: "I am sleepy" " I am having another sleep study done" Objective:   BP 124/78   Pulse 82   Temp 97.9 F (36.6 C) (Oral)   Resp 20   Wt 198 lb (89.8 kg)   SpO2 95%   BMI 31.01 kg/m   Review of Systems  Constitutional: Negative.  Negative for chills, diaphoresis, fever, malaise/fatigue and weight loss.  HENT: Negative.  Negative for congestion, ear discharge, ear pain, hearing loss, nosebleeds, sinus pain, sore throat and tinnitus.   Eyes: Negative.  Negative for blurred vision, double vision, photophobia, pain, discharge and redness.  Respiratory: Positive for wheezing. Negative for cough, hemoptysis, sputum production, shortness of breath and stridor.   Cardiovascular: Positive for leg swelling. Negative for chest pain, palpitations, orthopnea, claudication and PND.  Gastrointestinal: Positive for constipation. Negative for abdominal pain, blood in stool, diarrhea, heartburn, melena, nausea and vomiting.  Genitourinary: Negative for dysuria, flank pain, frequency, hematuria and urgency.  Musculoskeletal: Negative for back pain, falls, joint pain, myalgias and neck pain.  Skin: Negative.  Negative for itching and rash.       Noted white spots on legs after darken areas scabs fall off   Neurological: Positive for dizziness. Negative for tingling, tremors, sensory change, speech change, focal  weakness, seizures, loss of consciousness and headaches.  Endo/Heme/Allergies: Negative.  Negative for environmental allergies and polydipsia. Does not bruise/bleed easily.  Psychiatric/Behavioral: Negative.  Negative for depression, hallucinations, memory loss, substance abuse and suicidal ideas. The patient is not nervous/anxious and does not have insomnia.     Physical Exam  Constitutional: She is oriented to person, place, and time. She appears well-developed and well-nourished.  HENT:  Head: Normocephalic and atraumatic.  Eyes: Pupils are equal, round, and reactive to light. EOM are normal.  Neck: Normal range of motion. Neck supple.  Cardiovascular: Normal rate, regular rhythm, normal heart sounds and intact distal pulses.   Respiratory: Effort normal. She has wheezes.  GI: Soft. Bowel sounds are normal.  Musculoskeletal: She exhibits edema.  Neurological: She is alert and oriented to person, place, and time.  Skin: Skin is warm and dry.  Psychiatric: She has a normal mood and affect. Her behavior is normal. Judgment and thought content normal.    Encounter Medications:   Outpatient Encounter Prescriptions as of 05/31/2017  Medication Sig Note  . aspirin 81 MG EC tablet Take 81 mg by mouth every morning.    . Blood Glucose Monitoring Suppl (ACCU-CHEK AVIVA) device Use as instructed bid. E11.65   . calcitRIOL (ROCALTROL) 0.25 MCG capsule Take 0.25 mcg by mouth daily. Reported on 03/30/2016   . clopidogrel (PLAVIX) 75 MG tablet TAKE 1 TABLET ONCE DAILY.   Marland Kitchen ezetimibe (ZETIA) 10 MG tablet Take 10 mg by mouth daily.   . famotidine (PEPCID) 20 MG tablet Take 1 tablet (20 mg total) by mouth daily.   . Fluticasone-Salmeterol (ADVAIR DISKUS) 250-50 MCG/DOSE AEPB Inhale 1 puff into the lungs 2 (two) times daily.   Marland Kitchen  glucose blood (ONETOUCH VERIO) test strip USE TWO TIMES DAILY AS DIRECTED. E11.65   . Insulin Aspart Prot & Aspart (NOVOLOG MIX 70/30 FLEXPEN [place]) Inject 40-60 Units into the  skin 2 (two) times daily with a meal.   . insulin aspart protamine - aspart (NOVOLOG 70/30 MIX) (70-30) 100 UNIT/ML FlexPen Inject 35-50 Units into the skin 2 (two) times daily before a meal. Inject 50 units just before  breakfast if blood glucose is above 90 and inject 40 units just before  supper if blood glucose is above 90. 11/28/2016: Patient is now taking 50u qam and 40u qpm  . Insulin Pen Needle (B-D ULTRAFINE III SHORT PEN) 31G X 8 MM MISC 1 each by Does not apply route as directed.   . isosorbide mononitrate (IMDUR) 60 MG 24 hr tablet TAKE 1 TABLET ONCE DAILY.   Marland Kitchen Lancets (ACCU-CHEK SOFT TOUCH) lancets Use as instructed bid. E11.65.   . meclizine (ANTIVERT) 25 MG tablet Take 25 mg by mouth 4 (four) times daily as needed for dizziness.   . nitroGLYCERIN (NITROSTAT) 0.4 MG SL tablet Place 1 tablet (0.4 mg total) under the tongue every 5 (five) minutes as needed for chest pain. 04/30/2016: Has on hand if needed.   . polyethylene glycol (MIRALAX / GLYCOLAX) packet Take 17 g by mouth daily as needed for mild constipation. 04/30/2016: Takes if needed  . ranolazine (RANEXA) 1000 MG SR tablet Take 1 tablet (1,000 mg total) by mouth 2 (two) times daily.   . rosuvastatin (CRESTOR) 20 MG tablet Take 1 tablet (20 mg total) by mouth every evening.   . sennosides-docusate sodium (SENOKOT-S) 8.6-50 MG tablet Take 1 tablet by mouth 2 (two) times daily before a meal.   . torsemide (DEMADEX) 20 MG tablet MAY TAKE EXTRA 20MG DAILY FOR SWELLING.   Marland Kitchen lisinopril (PRINIVIL,ZESTRIL) 20 MG tablet Take 1 tablet (20 mg total) by mouth daily.    No facility-administered encounter medications on file as of 05/31/2017.     Functional Status:   In your present state of health, do you have any difficulty performing the following activities: 05/31/2017  Hearing? N  Vision? N  Difficulty concentrating or making decisions? N  Walking or climbing stairs? Y  Dressing or bathing? N  Doing errands, shopping? Y  Preparing Food  and eating ? N  Using the Toilet? N  In the past six months, have you accidently leaked urine? N  Do you have problems with loss of bowel control? N  Managing your Medications? N  Managing your Finances? N  Housekeeping or managing your Housekeeping? Y  Some recent data might be hidden    Fall/Depression Screening:    Fall Risk  05/31/2017 05/31/2017 01/31/2017  Falls in the past year? - No No  Number falls in past yr: - - -  Injury with Fall? - - -  Risk Factor Category  - - -  Risk for fall due to : (No Data) Impaired mobility;Impaired balance/gait;Medication side effect;Other (Comment) -  Risk for fall due to (comments): on antivert history of dizziness on  -  Follow up - - -   PHQ 2/9 Scores 05/31/2017 01/31/2017 09/25/2016 05/11/2016 04/13/2016 03/16/2016 02/23/2016  PHQ - 2 Score 0 0 0 0 0 0 0    Assessment:   Presents today sitting in living room with oxygen intact.  Still scheduled to get another sleep study to get a  New cpap.  Continue to manage DM/HTN/HDL with medications  Socially - Mrs  Babson confirms she is scheduled to move to a new apartment in [place]   Plan:  Discussed differences between cpaps and bipaps Discussed again bowel management prn medication use in present of constipation > 3 days Pt reminded to take senna bid -note written on refrigerator and on senna bottle  THN CM again discussed edema reduction Assisted with completion of letter to [facility] consumer and contacted [name] office for assist with medical information  [facility] CM reviewed her upcoming appointments for 06/14/17 for sleep study, 06/20/17 for Endocrinologist and 07/05/17 for CV MD  08/13/17 [name]  Routed notes to MD listed in care team    [facility] CM Care Plan Problem One     Most Recent Value  Care Plan Problem One  (P) Knowledge deficit related to diabetes as evidenced by patient questions around diabetes  Role Documenting the Problem One  (P) Care Management [place] for Problem One   (P) Active  THN Long Term Goal   (P) Patient will verbalize understanding of diabetes management over the next 60 days  THN Long Term Goal Start Date  (P) 03/22/17  Interventions for Problem One Long Term Goal  (P) discussed current plan of care,  reviewed upcoming appointment with CDE/RD,  reviewed MD recommendations  THN CM Short Term Goal #1   (P) Over the next 30 days, patient will verbalize understanding of which medications are used to treat her diabetes  THN CM Short Term Goal #1 Start Date  (P) 01/24/17  THN CM Short Term Goal #1 Met Date  (P) 02/28/17  Interventions for Short Term Goal #1  (P) Medication review planned for next week's home visit  THN CM Short Term Goal #2   (P) Over the next 30 days, patient will check cbg's daily and record as prescribed  THN CM Short Term Goal #2 Start Date  (P) 01/24/17  Interventions for Short Term Goal #2  (P) Reviewed prescribed plan to check cbg's daily,  plan review on next week's home visit  THN CM Short Term Goal #3  (P) over the next 30 days, patient will verbalize understanding of basic components of prescribed carb modified diet  THN CM Short Term Goal #3 Start Date  (P) 01/24/17  THN CM Short Term Goal #3 Met Date  (P) 02/28/17  Interventions for Short Tern Goal #3  (P) discussed carb modified diet,  prescribed Emmi educaitonal materials,  planned detailed review for next week's home visit        [place]. Lavina Hamman, RN, BSN, [place] [facility] [phone]

## 2017-06-04 DIAGNOSIS — J449 Chronic obstructive pulmonary disease, unspecified: Secondary | ICD-10-CM | POA: Diagnosis not present

## 2017-06-04 DIAGNOSIS — I10 Essential (primary) hypertension: Secondary | ICD-10-CM | POA: Diagnosis not present

## 2017-06-04 DIAGNOSIS — E1165 Type 2 diabetes mellitus with hyperglycemia: Secondary | ICD-10-CM | POA: Diagnosis not present

## 2017-06-04 DIAGNOSIS — R251 Tremor, unspecified: Secondary | ICD-10-CM | POA: Diagnosis not present

## 2017-06-11 ENCOUNTER — Other Ambulatory Visit: Payer: Self-pay | Admitting: "Endocrinology

## 2017-06-11 ENCOUNTER — Other Ambulatory Visit: Payer: Self-pay | Admitting: Cardiovascular Disease

## 2017-06-14 ENCOUNTER — Other Ambulatory Visit: Payer: Self-pay | Admitting: *Deleted

## 2017-06-14 ENCOUNTER — Ambulatory Visit: Payer: Medicare Other | Attending: Pulmonary Disease | Admitting: Neurology

## 2017-06-14 DIAGNOSIS — Z7982 Long term (current) use of aspirin: Secondary | ICD-10-CM | POA: Diagnosis not present

## 2017-06-14 DIAGNOSIS — Z7951 Long term (current) use of inhaled steroids: Secondary | ICD-10-CM | POA: Insufficient documentation

## 2017-06-14 DIAGNOSIS — Z79899 Other long term (current) drug therapy: Secondary | ICD-10-CM | POA: Insufficient documentation

## 2017-06-14 DIAGNOSIS — Z794 Long term (current) use of insulin: Secondary | ICD-10-CM | POA: Diagnosis not present

## 2017-06-14 DIAGNOSIS — G4733 Obstructive sleep apnea (adult) (pediatric): Secondary | ICD-10-CM | POA: Insufficient documentation

## 2017-06-14 DIAGNOSIS — Z7902 Long term (current) use of antithrombotics/antiplatelets: Secondary | ICD-10-CM | POA: Diagnosis not present

## 2017-06-14 NOTE — Patient Outreach (Signed)
[place] [facility]) Care Management   06/14/2017  Krystal [name] Nov 24, 1935 [id number]  Krystal [name] is an 81 y.o. female with diabetes mellitus, nephropathy, peripheral neuropathy,dyslipidemia, hypertension, and PVD. Krystal. [name] was originally referred to [facility] for transition of care services after a hospital admission in December 2017/January 2018 for hypertensive crisis. Most recently [name] has been assisting Krystal. [name] with medication management and assistance and diabetes management and education.  Krystal [name] has not been hospitalized nor to ED in the last 6 months  This is the eighth [facility] CM home visit.  Subjective:  "I'm getting ready for my sleep study" " I'm fair"  Objective:   BP (!) 146/78   Pulse 92   Temp (!) 97.5 F (36.4 C) (Oral)   Resp 20   Ht 1.702 m ('5\' 7"' )   Wt 197 lb (89.4 kg)   SpO2 98%   BMI 30.85 kg/m   Review of Systems  Constitutional: Negative for chills, diaphoresis, fever, malaise/fatigue and weight loss.  HENT: Negative for congestion, ear discharge, ear pain, hearing loss, nosebleeds, sinus pain, sore throat and tinnitus.   Eyes: Negative for blurred vision, double vision, photophobia, pain, discharge and redness.  Respiratory: Negative for cough, hemoptysis, sputum production, shortness of breath, wheezing and stridor.   Cardiovascular: Positive for chest pain and leg swelling. Negative for palpitations, orthopnea, claudication and PND.       Reports chest pain in intermittent and resolved with resting  Gastrointestinal: Positive for constipation. Negative for abdominal pain, blood in stool, diarrhea, heartburn, melena, nausea and vomiting.       Chronic hx of constipation  Genitourinary: Negative for dysuria, flank pain, frequency, hematuria and urgency.  Musculoskeletal: Negative for back pain, falls, joint pain, myalgias and neck pain.  Skin: Negative for itching and rash.  Neurological: Positive  for dizziness, weakness and headaches. Negative for tingling, tremors, sensory change, speech change, focal weakness, seizures and loss of consciousness.       Chronic hx of ataxia  Endo/Heme/Allergies: Negative for environmental allergies and polydipsia. Does not bruise/bleed easily.  Psychiatric/Behavioral: Negative for depression, hallucinations, memory loss, substance abuse and suicidal ideas. The patient is not nervous/anxious and does not have insomnia.     Physical Exam  Constitutional: She is oriented to person, place, and time. She appears well-developed and well-nourished.  HENT:  Head: Normocephalic.  Right Ear: External ear normal.  Eyes: Pupils are equal, round, and reactive to light. Conjunctivae are normal.  Neck: Normal range of motion. Neck supple.  Cardiovascular: Normal rate, regular rhythm, normal heart sounds and intact distal pulses.   Respiratory: Effort normal and breath sounds normal.  GI: Soft. Bowel sounds are normal.  Musculoskeletal: She exhibits edema.  Neurological: She is alert and oriented to person, place, and time.  Skin: Skin is warm and dry.  Psychiatric: She has a normal mood and affect. Her behavior is normal. Judgment and thought content normal.    Encounter Medications:   Outpatient Encounter Prescriptions as of 06/14/2017  Medication Sig Note  . ACCU-CHEK AVIVA PLUS test strip USE AS DIRECTED TO CHECK BLOOD SUGAR TWICE DAILY.   Marland Kitchen aspirin 81 MG EC tablet Take 81 mg by mouth every morning.    . Blood Glucose Monitoring Suppl (ACCU-CHEK AVIVA) device Use as instructed bid. E11.65   . calcitRIOL (ROCALTROL) 0.25 MCG capsule Take 0.25 mcg by mouth daily. Reported on 03/30/2016   . clopidogrel (PLAVIX) 75 MG tablet TAKE 1 TABLET  ONCE DAILY.   Marland Kitchen ezetimibe (ZETIA) 10 MG tablet Take 10 mg by mouth daily.   . famotidine (PEPCID) 20 MG tablet Take 1 tablet (20 mg total) by mouth daily.   . Fluticasone-Salmeterol (ADVAIR DISKUS) 250-50 MCG/DOSE AEPB Inhale  1 puff into the lungs 2 (two) times daily.   Marland Kitchen glucose blood (ONETOUCH VERIO) test strip USE TWO TIMES DAILY AS DIRECTED. E11.65   . Insulin Aspart Prot & Aspart (NOVOLOG MIX 70/30 FLEXPEN [place]) Inject 40-60 Units into the skin 2 (two) times daily with a meal.   . insulin aspart protamine - aspart (NOVOLOG 70/30 MIX) (70-30) 100 UNIT/ML FlexPen Inject 35-50 Units into the skin 2 (two) times daily before a meal. Inject 50 units just before  breakfast if blood glucose is above 90 and inject 40 units just before  supper if blood glucose is above 90. 11/28/2016: Patient is now taking 50u qam and 40u qpm  . Insulin Pen Needle (B-D ULTRAFINE III SHORT PEN) 31G X 8 MM MISC 1 each by Does not apply route as directed.   . isosorbide mononitrate (IMDUR) 60 MG 24 hr tablet TAKE 1 TABLET ONCE DAILY.   Marland Kitchen Lancets (ACCU-CHEK SOFT TOUCH) lancets Use as instructed bid. E11.65.   . meclizine (ANTIVERT) 25 MG tablet Take 25 mg by mouth 4 (four) times daily as needed for dizziness.   . nitroGLYCERIN (NITROSTAT) 0.4 MG SL tablet Place 1 tablet (0.4 mg total) under the tongue every 5 (five) minutes as needed for chest pain. 04/30/2016: Has on hand if needed.   . polyethylene glycol (MIRALAX / GLYCOLAX) packet Take 17 g by mouth daily as needed for mild constipation. 04/30/2016: Takes if needed  . ranolazine (RANEXA) 1000 MG SR tablet Take 1 tablet (1,000 mg total) by mouth 2 (two) times daily.   . rosuvastatin (CRESTOR) 20 MG tablet Take 1 tablet (20 mg total) by mouth every evening.   . sennosides-docusate sodium (SENOKOT-S) 8.6-50 MG tablet Take 1 tablet by mouth 2 (two) times daily before a meal.   . torsemide (DEMADEX) 20 MG tablet MAY TAKE EXTRA 20MG DAILY FOR SWELLING.   Marland Kitchen lisinopril (PRINIVIL,ZESTRIL) 20 MG tablet Take 1 tablet (20 mg total) by mouth daily.    No facility-administered encounter medications on file as of 06/14/2017.     Functional Status:   In your present state of health, do you have any difficulty  performing the following activities: 05/31/2017  Hearing? N  Vision? N  Difficulty concentrating or making decisions? N  Walking or climbing stairs? Y  Dressing or bathing? N  Doing errands, shopping? Y  Preparing Food and eating ? N  Using the Toilet? N  In the past six months, have you accidently leaked urine? N  Do you have problems with loss of bowel control? N  Managing your Medications? N  Managing your Finances? N  Housekeeping or managing your Housekeeping? Y  Some recent data might be hidden    Fall/Depression Screening:    Fall Risk  05/31/2017 05/31/2017 01/31/2017  Falls in the past year? - No No  Number falls in past yr: - - -  Injury with Fall? - - -  Risk Factor Category  - - -  Risk for fall due to : (No Data) Impaired mobility;Impaired balance/gait;Medication side effect;Other (Comment) -  Risk for fall due to (comments): on antivert history of dizziness on  -  Follow up - - -   [facility] 2/9 Scores 05/31/2017 01/31/2017 09/25/2016 05/11/2016  04/13/2016 03/16/2016 02/23/2016  PHQ - 2 Score 0 0 0 0 0 0 0    Assessment:   Krystal [name] greeted [facility] CM at her dining room table after completing her bath. Continues to receive [facility] CNA services.  CNA left shortly after [facility] CM arrived for home visit Admits to hard stools and not taking in enough vegetables nor taking stool softeners as recommended. THN CM recommends beans, increase fluid intake, stool softener and soft foods  Krystal [name] discussed with [facility] CM she had been checking her CBG using stick prompt #7 to stick her finger causing increased pain because "my fingers are tough" THN CM reviewed standard fingerstick process (sticking on the side) and encouraged finger stick depth #3 to decrease pain Krystal [name] is verbalizing understanding and teach back method used to evaluate her understanding.  States she went to her pcp appointment in the last 2  Weeks to get assist with medical information needed to send to [facility] consumer but was not  seen because of the wait time. Reports leaving pcp office after "two and a half hours" Next follow up in 2-3 months- 08/13/17 cbg of 289 was taken during home visit with [facility] CM  Upcoming appointment with nephrologist, Dr. Hinda Lenis on 06/19/17 discussed Discussed and answered questions related to 06/14/17 2000 sleep study to assist with obtaining new cpap/bipap after present one not working appropriately    Plan:  [facility] CM instructed Krystal [name] in how to take her cbg using her glucometer using lower quantity of blood drop and take insulin shot in less muscular area  THN CM interventions as listed in above note Route note to [facility] care team MDs To see Krystal [name] in 2 weeks for home visit [facility] CM Care Plan Problem One     Most Recent Value  Care Plan Problem One  Knowledge deficit related to diabetes as evidenced by patient questions around diabetes  Role Documenting the Problem One  Care Management [place] for Problem One  Active  [facility] Long Term Goal   Patient will verbalize understanding of diabetes management over the next 60 days  THN Long Term Goal Start Date  06/14/17  Interventions for Problem One Long Term Goal  Re reviewed how to check cbg and how to give insulin,  discussed current plan of care,  reviewed upcoming appointment with CDE/RD,  reviewed MD recommendations  THN CM Short Term Goal #1   Over the next 30 days, patient will verbalize understanding of which medications are used to treat her diabetes  THN CM Short Term Goal #1 Start Date  01/24/17  [facility] CM Short Term Goal #1 Met Date  02/28/17  Interventions for Short Term Goal #1  Medication review planned for next week's home visit  THN CM Short Term Goal #2   Over the next 30 days, patient will check cbg's daily and record as prescribed  THN CM Short Term Goal #2 Start Date  01/24/17  [facility] CM Short Term Goal #2 Met Date  02/28/17  Interventions for Short Term Goal #2  Reviewed prescribed plan to check cbg's daily,  plan  review on next week's home visit  THN CM Short Term Goal #3  over the next 30 days, patient will verbalize understanding of basic components of prescribed carb modified diet  THN CM Short Term Goal #3 Start Date  01/24/17  [facility] CM Short Term Goal #3 Met Date  02/28/17  Interventions for Short Tern Goal #3  discussed  carb modified diet,  prescribed Emmi educaitonal materials,  planned detailed review for next week's home visit      [place]. Lavina Hamman, RN, BSN, [place] Care Management [phone]

## 2017-06-17 ENCOUNTER — Other Ambulatory Visit: Payer: Self-pay | Admitting: "Endocrinology

## 2017-06-17 DIAGNOSIS — Z79899 Other long term (current) drug therapy: Secondary | ICD-10-CM | POA: Diagnosis not present

## 2017-06-17 DIAGNOSIS — N184 Chronic kidney disease, stage 4 (severe): Secondary | ICD-10-CM | POA: Diagnosis not present

## 2017-06-17 DIAGNOSIS — R809 Proteinuria, unspecified: Secondary | ICD-10-CM | POA: Diagnosis not present

## 2017-06-17 DIAGNOSIS — N183 Chronic kidney disease, stage 3 (moderate): Secondary | ICD-10-CM | POA: Diagnosis not present

## 2017-06-17 DIAGNOSIS — E1122 Type 2 diabetes mellitus with diabetic chronic kidney disease: Secondary | ICD-10-CM | POA: Diagnosis not present

## 2017-06-17 DIAGNOSIS — E559 Vitamin D deficiency, unspecified: Secondary | ICD-10-CM | POA: Diagnosis not present

## 2017-06-17 DIAGNOSIS — Z794 Long term (current) use of insulin: Secondary | ICD-10-CM | POA: Diagnosis not present

## 2017-06-17 DIAGNOSIS — I10 Essential (primary) hypertension: Secondary | ICD-10-CM | POA: Diagnosis not present

## 2017-06-17 LAB — COMPREHENSIVE METABOLIC PANEL
ALK PHOS: 71 U/L (ref 33–130)
ALT: 11 U/L (ref 6–29)
AST: 15 U/L (ref 10–35)
Albumin: 3.9 g/dL (ref 3.6–5.1)
BILIRUBIN TOTAL: 0.4 mg/dL (ref 0.2–1.2)
BUN: 26 mg/dL — AB (ref 7–25)
CALCIUM: 9.1 mg/dL (ref 8.6–10.4)
CO2: 26 mmol/L (ref 20–31)
Chloride: 102 mmol/L (ref 98–110)
Creat: 2.26 mg/dL — ABNORMAL HIGH (ref 0.60–0.93)
GLUCOSE: 114 mg/dL — AB (ref 65–99)
POTASSIUM: 3.8 mmol/L (ref 3.5–5.3)
Sodium: 142 mmol/L (ref 135–146)
TOTAL PROTEIN: 6.7 g/dL (ref 6.1–8.1)

## 2017-06-18 LAB — HEMOGLOBIN A1C
HEMOGLOBIN A1C: 9.5 % — AB (ref <5.7)
Mean Plasma Glucose: 226 mg/dL

## 2017-06-19 DIAGNOSIS — E559 Vitamin D deficiency, unspecified: Secondary | ICD-10-CM | POA: Diagnosis not present

## 2017-06-19 DIAGNOSIS — N184 Chronic kidney disease, stage 4 (severe): Secondary | ICD-10-CM | POA: Diagnosis not present

## 2017-06-19 DIAGNOSIS — N2581 Secondary hyperparathyroidism of renal origin: Secondary | ICD-10-CM | POA: Diagnosis not present

## 2017-06-19 DIAGNOSIS — R809 Proteinuria, unspecified: Secondary | ICD-10-CM | POA: Diagnosis not present

## 2017-06-20 ENCOUNTER — Ambulatory Visit (INDEPENDENT_AMBULATORY_CARE_PROVIDER_SITE_OTHER): Payer: Medicare Other | Admitting: "Endocrinology

## 2017-06-20 ENCOUNTER — Encounter: Payer: Self-pay | Admitting: "Endocrinology

## 2017-06-20 VITALS — BP 152/82 | HR 92 | Ht 67.0 in | Wt 199.0 lb

## 2017-06-20 DIAGNOSIS — Z794 Long term (current) use of insulin: Secondary | ICD-10-CM

## 2017-06-20 DIAGNOSIS — Z6831 Body mass index (BMI) 31.0-31.9, adult: Secondary | ICD-10-CM | POA: Diagnosis not present

## 2017-06-20 DIAGNOSIS — I25118 Atherosclerotic heart disease of native coronary artery with other forms of angina pectoris: Secondary | ICD-10-CM | POA: Diagnosis not present

## 2017-06-20 DIAGNOSIS — E6609 Other obesity due to excess calories: Secondary | ICD-10-CM | POA: Diagnosis not present

## 2017-06-20 DIAGNOSIS — N184 Chronic kidney disease, stage 4 (severe): Secondary | ICD-10-CM | POA: Diagnosis not present

## 2017-06-20 DIAGNOSIS — E782 Mixed hyperlipidemia: Secondary | ICD-10-CM

## 2017-06-20 DIAGNOSIS — I10 Essential (primary) hypertension: Secondary | ICD-10-CM

## 2017-06-20 DIAGNOSIS — E1122 Type 2 diabetes mellitus with diabetic chronic kidney disease: Secondary | ICD-10-CM

## 2017-06-20 MED ORDER — FREESTYLE LIBRE SENSOR SYSTEM MISC: 2 refills | Status: AC

## 2017-06-20 MED ORDER — GLUCOSE BLOOD VI STRP: ORAL_STRIP | 2 refills | Status: DC

## 2017-06-20 MED ORDER — FREESTYLE LIBRE READER DEVI: 1.0000 | Freq: Once | 0 refills | Status: AC

## 2017-06-20 NOTE — Progress Notes (Signed)
Subjective:    Patient ID: Krystal [name], female    DOB: Jan 04, 1936, PCP Iona Beard, MD   Past Medical History:  Diagnosis Date  . Allergic rhinitis   . CAD (coronary artery disease) 2009   5 stents- #3 in RCA, #1 each in LAD and AVG  . CHF (congestive heart failure) ([place])   . CKD (chronic kidney disease) stage 3, GFR 30-59 ml/min   . Complete heart block, transient 2014  . Diastolic dysfunction, left ventricle   . DJD (degenerative joint disease)   . Emphysema lung (HCC)    2L N/C continuously  . Essential hypertension   . GERD (gastroesophageal reflux disease)   . Glaucoma   . Gout   . History of uterine cancer   . HOH (hard of hearing)   . Hyperlipidemia   . MI (myocardial infarction) ([place]) 1998  . Microcytic anemia    History of occult blood in stool  . Myalgia   . Osteoarthritis   . Palpitations   . Personal history of colonic polyps   . Sleep apnea    CPAP  . Type II diabetes mellitus with nephropathy [facility])    Past Surgical History:  Procedure Laterality Date  . ABDOMINAL HYSTERECTOMY  04/2010   Uterine cancer,TAHBSO  . CERVICAL DISCECTOMY     L5 left/hemilminectomy  . COLONOSCOPY  09/2006   Int hemmorhoids, COMPLICATED BY CARDIOPULMONARY COMPLICATIONS  . CORONARY ANGIOPLASTY  1/99, 1/07, 1/08, 4/09   5 cardiac stents total  . ECTOPIC PREGNANCY SURGERY    . FOOT SURGERY     Left and right for callous  . LEFT AND RIGHT HEART CATHETERIZATION WITH CORONARY ANGIOGRAM N/A 09/12/2012   Procedure: LEFT AND RIGHT HEART CATHETERIZATION WITH CORONARY ANGIOGRAM;  Surgeon: Sanda Klein, MD;  Location: [place] CATH LAB;  Service: Cardiovascular;  Laterality: N/A;  . POLYPECTOMY  10/22/2011   Internal hemorrhoids/sessile polyp   Social History   Social History  . Marital status: Widowed    Spouse name: N/A  . Number of children: 0  . Years of education: 12   Occupational History  .      retired   Social History Main Topics  . Smoking status: Former  Smoker    Packs/day: 0.40    Years: 51.00    Types: Cigarettes    Start date: 04/22/1955    Quit date: 10/18/2006  . Smokeless tobacco: Never Used     Comment: quit about 4 yrs ago  . Alcohol use No  . Drug use: No  . Sexual activity: No   Other Topics Concern  . None   Social History Narrative   Consumes 2 cups of caffeine daily   Outpatient Encounter Prescriptions as of 06/20/2017  Medication Sig  . ACCU-CHEK AVIVA PLUS test strip USE AS DIRECTED TO CHECK BLOOD SUGAR TWICE DAILY.  Marland Kitchen aspirin 81 MG EC tablet Take 81 mg by mouth every morning.   . Blood Glucose Monitoring Suppl (ACCU-CHEK AVIVA) device Use as instructed bid. E11.65  . budesonide-formoterol (SYMBICORT) 160-4.5 MCG/ACT inhaler Inhale 2 puffs into the lungs 2 (two) times daily.  . calcitRIOL (ROCALTROL) 0.25 MCG capsule Take 0.25 mcg by mouth daily. Reported on 03/30/2016  . clopidogrel (PLAVIX) 75 MG tablet TAKE 1 TABLET ONCE DAILY.  Marland Kitchen Continuous Blood Gluc Receiver (FREESTYLE LIBRE READER) DEVI 1 Piece by Does not apply route once.  . Continuous Blood Gluc Sensor (FREESTYLE LIBRE SENSOR SYSTEM) MISC Use one sensor every 10 days.  Marland Kitchen  ezetimibe (ZETIA) 10 MG tablet Take 10 mg by mouth daily.  . famotidine (PEPCID) 20 MG tablet Take 1 tablet (20 mg total) by mouth daily.  . Fluticasone-Salmeterol (ADVAIR DISKUS) 250-50 MCG/DOSE AEPB Inhale 1 puff into the lungs 2 (two) times daily.  Marland Kitchen glucose blood (ONETOUCH VERIO) test strip USE TWO TIMES DAILY AS DIRECTED. E11.65  . Insulin Aspart Prot & Aspart (NOVOLOG MIX 70/30 FLEXPEN [place]) Inject 40-60 Units into the skin 2 (two) times daily with a meal.  . Insulin Pen Needle (B-D ULTRAFINE III SHORT PEN) 31G X 8 MM MISC 1 each by Does not apply route as directed.  . isosorbide mononitrate (IMDUR) 60 MG 24 hr tablet TAKE 1 TABLET ONCE DAILY.  Marland Kitchen Lancets (ACCU-CHEK SOFT TOUCH) lancets Use as instructed bid. E11.65.  Marland Kitchen lisinopril (PRINIVIL,ZESTRIL) 20 MG tablet Take 1 tablet (20 mg total)  by mouth daily.  . meclizine (ANTIVERT) 25 MG tablet Take 25 mg by mouth 4 (four) times daily as needed for dizziness.  . nitroGLYCERIN (NITROSTAT) 0.4 MG SL tablet Place 1 tablet (0.4 mg total) under the tongue every 5 (five) minutes as needed for chest pain.  . polyethylene glycol (MIRALAX / GLYCOLAX) packet Take 17 g by mouth daily as needed for mild constipation.  . ranolazine (RANEXA) 1000 MG SR tablet Take 1 tablet (1,000 mg total) by mouth 2 (two) times daily.  . rosuvastatin (CRESTOR) 20 MG tablet Take 1 tablet (20 mg total) by mouth every evening.  . sennosides-docusate sodium (SENOKOT-S) 8.6-50 MG tablet Take 1 tablet by mouth 2 (two) times daily before a meal.  . torsemide (DEMADEX) 20 MG tablet MAY TAKE EXTRA 20MG  DAILY FOR SWELLING.  . [DISCONTINUED] insulin aspart protamine - aspart (NOVOLOG 70/30 MIX) (70-30) 100 UNIT/ML FlexPen Inject 35-50 Units into the skin 2 (two) times daily before a meal. Inject 50 units just before  breakfast if blood glucose is above 90 and inject 40 units just before  supper if blood glucose is above 90.   No facility-administered encounter medications on file as of 06/20/2017.    ALLERGIES: Allergies  Allergen Reactions  . Morphine Shortness Of Breath and Swelling  . Penicillins Shortness Of Breath and Swelling    Has patient had a PCN reaction causing immediate rash, facial/tongue/throat swelling, SOB or lightheadedness with hypotension: No Has patient had a PCN reaction causing severe rash involving mucus membranes or skin necrosis: No Has patient had a PCN reaction that required hospitalization Yes Has patient had a PCN reaction occurring within the last 10 years: No If all of the above answers are "NO", then may proceed with Cephalosporin use.   . Shellfish Allergy    VACCINATION STATUS: Immunization History  Administered Date(s) Administered  . Influenza Whole 09/23/2007  . Influenza,inj,Quad PF,36+ Mos 08/20/2015  . Influenza-Unspecified  09/19/2013  . Pneumococcal Polysaccharide-23 08/20/2015    Diabetes  She presents for her follow-up diabetic visit. She has type 2 diabetes mellitus. Onset time: She was diagnosed at approximate age of 23 years. Her disease course has been improving. There are no hypoglycemic associated symptoms. Pertinent negatives for hypoglycemia include no confusion, headaches, pallor or seizures. Associated symptoms include fatigue, polydipsia and polyuria. Pertinent negatives for diabetes include no chest pain and no polyphagia. There are no hypoglycemic complications. Symptoms are improving. Diabetic complications include heart disease, nephropathy, peripheral neuropathy and PVD. Risk factors for coronary artery disease include dyslipidemia, diabetes mellitus, hypertension, obesity, sedentary lifestyle and tobacco exposure. Current diabetic treatment includes insulin injections (  She was asked to bring all of her insulin supplies at home. She managed to bring to bags of various kinds of insulin mainly duplicates of long-acting, premixed, and rapid acting insulin types. At least half of the insulin pens she brought in are expi). Her weight is stable. She is following a generally unhealthy diet. She has had a previous visit with a dietitian. She never participates in exercise. Her home blood glucose trend is fluctuating dramatically (He did brought her meter and log showing above target BG readings , monitoring 2-3 times a day, E AG between 100-170.). Her breakfast blood glucose range is generally >200 mg/dl. Her lunch blood glucose range is generally 180-200 mg/dl. Her dinner blood glucose range is generally 180-200 mg/dl. Her bedtime blood glucose range is generally >200 mg/dl. Her overall blood glucose range is >200 mg/dl. An ACE inhibitor/angiotensin II receptor blocker is being taken.  Hyperlipidemia  This is a chronic problem. The current episode started more than 1 year ago. Exacerbating diseases include diabetes  and obesity. Pertinent negatives include no chest pain, myalgias or shortness of breath. Current antihyperlipidemic treatment includes bile acid squestrants. Risk factors for coronary artery disease include diabetes mellitus, dyslipidemia, hypertension and obesity.  Hypertension  This is a chronic problem. The current episode started more than 1 year ago. Pertinent negatives include no chest pain, headaches, palpitations or shortness of breath. Risk factors for coronary artery disease include diabetes mellitus, dyslipidemia, obesity, sedentary lifestyle and smoking/tobacco exposure. Past treatments include ACE inhibitors. Hypertensive end-organ damage includes kidney disease, CAD/MI and PVD.     Review of Systems  Constitutional: Positive for fatigue. Negative for unexpected weight change.  HENT: Negative for trouble swallowing and voice change.   Eyes: Negative for visual disturbance.  Respiratory: Negative for cough, shortness of breath and wheezing.   Cardiovascular: Negative for chest pain, palpitations and leg swelling.  Gastrointestinal: Negative for diarrhea, nausea and vomiting.  Endocrine: Positive for polydipsia and polyuria. Negative for cold intolerance, heat intolerance and polyphagia.  Musculoskeletal: Negative for arthralgias and myalgias.  Skin: Negative for color change, pallor, rash and wound.  Neurological: Negative for seizures and headaches.  Psychiatric/Behavioral: Negative for confusion and suicidal ideas.    Objective:    BP (!) 152/82   Pulse 92   Ht 5\' 7"  (1.702 m)   Wt 199 lb (90.3 kg)   BMI 31.17 kg/m   Wt Readings from Last 3 Encounters:  06/20/17 199 lb (90.3 kg)  06/14/17 197 lb (89.4 kg)  05/31/17 198 lb (89.8 kg)    Physical Exam  Constitutional: She is oriented to person, place, and time. She appears well-developed.  HENT:  Head: Normocephalic and atraumatic.  Eyes: EOM are normal.  Neck: Normal range of motion. Neck supple. No tracheal  deviation present. No thyromegaly present.  Cardiovascular: Normal rate and regular rhythm.   Pulses:      Dorsalis pedis pulses are 0 on the right side, and 0 on the left side.       Posterior tibial pulses are 0 on the right side, and 0 on the left side.  She has absent pulsations on right lateral dorsalis pedis and posterior tibial arteries.  Pulmonary/Chest: Effort normal and breath sounds normal.  Abdominal: Soft. Bowel sounds are normal. There is no tenderness. There is no guarding.  Musculoskeletal: Normal range of motion. She exhibits no edema.  Neurological: She is alert and oriented to person, place, and time. She has normal reflexes. A sensory deficit is present.  No cranial nerve deficit. Coordination normal.  She has significant deficit on monofilament test on bilateral feet.  Skin: Skin is warm and dry. No rash noted. No erythema. No pallor.  Psychiatric:  She is significantly reluctant with possible cognitive deficit.     Complete Blood Count (Most recent): Lab Results  Component Value Date   WBC 8.3 11/08/2015   HGB 9.9 (L) 11/08/2015   HCT 32.6 (L) 11/08/2015   MCV 75.8 (L) 11/08/2015   PLT 263 11/08/2015   Chemistry (most recent): Lab Results  Component Value Date   NA 142 06/17/2017   K 3.8 06/17/2017   CL 102 06/17/2017   CO2 26 06/17/2017   BUN 26 (H) 06/17/2017   CREATININE 2.26 (H) 06/17/2017   Diabetic Labs (most recent): Lab Results  Component Value Date   HGBA1C 9.5 (H) 06/17/2017   HGBA1C 10.4 (H) 01/24/2017   HGBA1C 11.3 (H) 07/04/2016   Lipid Panel     Component Value Date/Time   CHOL 151 05/07/2016 1006   TRIG 217 (H) 05/07/2016 1006   HDL 37 (L) 05/07/2016 1006   CHOLHDL 4.1 05/07/2016 1006   VLDL 43 (H) 05/07/2016 1006   LDLCALC 71 05/07/2016 1006   LDLDIRECT 118.4 08/13/2013 1502     Assessment & Plan:   1. Uncontrolled type 2 diabetes mellitus with other specified complication ([place])  - Her  diabetes is  complicated by  coronary artery disease, chronic kidney disease, peripheral neuropathy, and peripheral arterial disease and patient remains at a high risk for more acute and chronic complications of diabetes which include CAD, CVA, CKD, retinopathy, and neuropathy. These are all discussed in detail with the patient.  -Her recent A1c slowly improving to 9.5 %, slowly improving from 12.2%. -She admits significant dietary indiscretion, admits to ingestion of large amounts of soda and processed carbohydrates. Recent labs reviewed.   - I have re-counseled the patient on diet management  by adopting a carbohydrate restricted / protein rich  Diet.  - Suggestion is made for patient to avoid simple carbohydrates   from her diet including Cakes , Desserts, Ice Cream,  Soda (  diet and regular) , Sweet Tea , Candies,  Chips, Cookies, Artificial Sweeteners,   and "Sugar-free" Products .  This will help patient to have stable blood glucose profile and potentially avoid unintended  Weight gain.  - Patient is advised to stick to a routine mealtimes to eat 3 meals  a day and avoid unnecessary snacks ( to snack only to correct hypoglycemia).   - I have approached patient with the following individualized plan to manage diabetes and patient agrees.   -Mrs. Hirota  would have benefited from basal/bolus insulin however this was too overwhelming for her.  she lives alone. - She came with slightly better blood glucose profile.  She will be continued on premixed insulin Novolog 70/30,  60 units with breakfast and 40 units with supper for pre-meal blood glucose above 90 mg/dL, associated with strict monitoring of blood glucose 4 times a day-before meals and at bedtime.  -Patient is encouraged to call clinic for blood glucose levels less than 70 or above 300 mg /dl.  - She would benefit from continuous glucose monitoring, I have discussed and initiated prescription for [facility] device and sensor to [facility]. - Patient  specific target  for A1c; LDL, HDL, Triglycerides, and  Waist Circumference were discussed in detail.  2) BP/HTN: Uncontrolled, patient could not confirm if she is consistent taking  her blood pressure medications. I advised her to resume her current medications including ACEI/ARB.  3) Lipids/HPL:  continue ezetimibe, continue Crestor 20 mg by mouth daily at bedtime.   4)  Weight/Diet: CDE consult in progress, exercise, and carbohydrates information provided.  5) Chronic Care/Health Maintenance:  -Patient  on ACEI/ARB and Statin medications and encouraged to continue to follow up with Ophthalmology, Podiatrist at least yearly or according to recommendations, and advised to  stay away from smoking. I have recommended yearly flu vaccine and pneumonia vaccination at least every 5 years; moderate intensity exercise for up to 150 minutes weekly; and  sleep for at least 7 hours a day. - She has significant peripheral neuropathy and peripheral arterial disease putting her at risk for diabetes foot ulcer. She is being evaluated by her podiatrist for custom shoes. She would benefit from this therapeutic measure. I have signed off on her paperwork for same.  - 20 minutes of time was spent on the care of this patient , 50% of which was applied for counseling on diabetes complications and their preventions.  - I advised patient to maintain close follow up with Iona Beard, MD for primary care needs.  Patient is asked to bring meter and  blood glucose logs during her next visit.   Follow up plan: -Return for follow up with pre-visit labs, meter, and logs.  Glade Lloyd, MD Phone: [phone]  Fax: [phone]   06/20/2017, 2:34 PM

## 2017-06-24 ENCOUNTER — Other Ambulatory Visit: Payer: Self-pay | Admitting: "Endocrinology

## 2017-06-24 ENCOUNTER — Other Ambulatory Visit (HOSPITAL_COMMUNITY): Payer: Self-pay | Admitting: Family Medicine

## 2017-06-24 DIAGNOSIS — Z1231 Encounter for screening mammogram for malignant neoplasm of breast: Secondary | ICD-10-CM

## 2017-06-28 ENCOUNTER — Other Ambulatory Visit: Payer: Self-pay | Admitting: *Deleted

## 2017-06-28 NOTE — Patient Outreach (Signed)
[facility]) Care Management   06/28/2017  Krystal [name] 03-Oct-1936 [id number]  Krystal [name] is an 81 y.o. female with diabetes mellitus, nephropathy, peripheral neuropathy,dyslipidemia, hypertension, and PVD. Krystal [name] was originally referred to [facility] for transition of care services after a hospital admission in December 2017/January 2018 for hypertensive crisis. Most recently [facility] has been assisting Krystal [name] with medication management and assistance and diabetes management and education.  Krystal [name] has not been hospitalized nor to ED in the last 6 months   Subjective:  See notes below Objective:  BP 128/80   Pulse 85   Temp 97.8 F (36.6 C) (Oral)   Resp 20   SpO2 98%    Review of Systems  Constitutional: Negative for chills, diaphoresis, fever, malaise/fatigue and weight loss.  HENT: Positive for congestion. Negative for ear discharge, ear pain, hearing loss, nosebleeds and tinnitus.   Eyes: Negative.  Negative for blurred vision, double vision, photophobia, pain, discharge and redness.  Cardiovascular: Positive for leg swelling. Negative for chest pain, palpitations, orthopnea and claudication.  Gastrointestinal: Positive for constipation.  Genitourinary: Negative.  Negative for dysuria, flank pain, frequency, hematuria and urgency.  Musculoskeletal: Positive for back pain and joint pain. Negative for falls, myalgias and neck pain.  Skin: Negative.   Neurological: Positive for dizziness and weakness.  Endo/Heme/Allergies: Negative.  Negative for environmental allergies and polydipsia. Does not bruise/bleed easily.  Psychiatric/Behavioral: Negative.  Negative for depression, hallucinations, memory loss, substance abuse and suicidal ideas. The patient is not nervous/anxious and does not have insomnia.     Physical Exam  Constitutional: She is oriented to person, place, and time. She appears well-developed and  well-nourished.  HENT:  Head: Normocephalic and atraumatic.  Eyes: Conjunctivae are normal. Pupils are equal, round, and reactive to light.  Neck: Normal range of motion. Neck supple.  Cardiovascular: Normal rate.  Respiratory: Effort normal and breath sounds normal.  GI: Soft. Bowel sounds are normal.  Musculoskeletal: She exhibits edema and tenderness.  Neurological: She is alert and oriented to person, place, and time.  Skin: Skin is warm and dry.  Psychiatric: She has a normal mood and affect. Her behavior is normal. Judgment and thought content normal.    Encounter Medications:   Outpatient Encounter Prescriptions as of 06/28/2017  Medication Sig Note  . aspirin 81 MG EC tablet Take 81 mg by mouth every morning.    . BD PEN NEEDLE NANO U/F 32G X 4 MM MISC USE AS DIRECTED TWICE DAILY.   . budesonide-formoterol (SYMBICORT) 160-4.5 MCG/ACT inhaler Inhale 2 puffs into the lungs 2 (two) times daily.   . calcitRIOL (ROCALTROL) 0.25 MCG capsule Take 0.25 mcg by mouth daily. Reported on 03/30/2016   . clopidogrel (PLAVIX) 75 MG tablet TAKE 1 TABLET ONCE DAILY.   Marland Kitchen Continuous Blood Gluc Sensor (FREESTYLE LIBRE SENSOR SYSTEM) MISC Use one sensor every 10 days.   Marland Kitchen ezetimibe (ZETIA) 10 MG tablet Take 10 mg by mouth daily.   . famotidine (PEPCID) 20 MG tablet Take 1 tablet (20 mg total) by mouth daily.   . Fluticasone-Salmeterol (ADVAIR DISKUS) 250-50 MCG/DOSE AEPB Inhale 1 puff into the lungs 2 (two) times daily.   Marland Kitchen glucose blood (ACCU-CHEK GUIDE) test strip Use as instructed   . Insulin Aspart Prot & Aspart (NOVOLOG MIX 70/30 FLEXPEN [place]) Inject 40-60 Units into the skin 2 (two) times daily with a meal.   . Insulin Pen Needle (B-D ULTRAFINE III SHORT PEN) 31G  X 8 MM MISC 1 each by Does not apply route as directed.   . isosorbide mononitrate (IMDUR) 60 MG 24 hr tablet TAKE 1 TABLET ONCE DAILY.   Marland Kitchen Lancets (ACCU-CHEK SOFT TOUCH) lancets Use as instructed bid. E11.65.   Marland Kitchen lisinopril  (PRINIVIL,ZESTRIL) 20 MG tablet Take 1 tablet (20 mg total) by mouth daily.   . meclizine (ANTIVERT) 25 MG tablet Take 25 mg by mouth 4 (four) times daily as needed for dizziness.   . nitroGLYCERIN (NITROSTAT) 0.4 MG SL tablet Place 1 tablet (0.4 mg total) under the tongue every 5 (five) minutes as needed for chest pain. 04/30/2016: Has on hand if needed.   Marland Kitchen NOVOLOG MIX 70/30 FLEXPEN (70-30) 100 UNIT/ML FlexPen INJECT 50 UNITS IN THE MONRING AND 40 UNITS IN THE EVENING.   Marland Kitchen polyethylene glycol (MIRALAX / GLYCOLAX) packet Take 17 g by mouth daily as needed for mild constipation. 04/30/2016: Takes if needed  . ranolazine (RANEXA) 1000 MG SR tablet Take 1 tablet (1,000 mg total) by mouth 2 (two) times daily.   . rosuvastatin (CRESTOR) 20 MG tablet Take 1 tablet (20 mg total) by mouth every evening.   . sennosides-docusate sodium (SENOKOT-S) 8.6-50 MG tablet Take 1 tablet by mouth 2 (two) times daily before a meal.   . torsemide (DEMADEX) 20 MG tablet MAY TAKE EXTRA 20MG  DAILY FOR SWELLING.    No facility-administered encounter medications on file as of 06/28/2017.     Functional Status:   In your present state of health, do you have any difficulty performing the following activities: 06/14/2017 05/31/2017  Hearing? N N  Vision? N N  Difficulty concentrating or making decisions? N N  Walking or climbing stairs? Y Y  Dressing or bathing? N N  Doing errands, shopping? [name]  Preparing Food and eating ? N N  Using the Toilet? N N  In the past six months, have you accidently leaked urine? N N  Do you have problems with loss of bowel control? N N  Managing your Medications? N N  Managing your Finances? N N  Housekeeping or managing your Housekeeping? - Y  Some recent data might be hidden    Fall/Depression Screening:    Fall Risk  06/20/2017 05/31/2017 05/31/2017  Falls in the past year? No - No  Number falls in past yr: - - -  Injury with Fall? - - -  Risk Factor Category  - - -  Risk for fall due  to : - (No Data) Impaired mobility;Impaired balance/gait;Medication side effect;Other (Comment)  Risk for fall due to: Comment - on antivert history of dizziness on   Follow up - - -   PHQ 2/9 Scores 06/20/2017 06/17/2017 05/31/2017 01/31/2017 09/25/2016 05/11/2016 04/13/2016  PHQ - 2 Score 0 0 0 0 0 0 0    Assessment:  Concern today is back and hip, nasal drainage   Presents with her oxygen on today at her dining room table Sleep study went well Still having active dreams but of dead relatives  Still anticipating moving into a new apartment in the same complex; packing  Voiced concern about her new CAPs CNA Allowed her time to ventilate her feelings Discussed sinus mucus production and the need for nasal medication to decrease mucus production no medication on medication review list for symptoms  Upcoming appointments  Dr Luan Pulling to be seen 2 pm on 07/07/17 ? Dr Dorris Fetch to be sen 09/2017  Next see her MD 8/31 2;30   Plan:  To follow up with Krystal Rottinghaus in 2-4 weeks  [facility] CM instructed her on how to use her glucometer to find averages and last taken cbgs Assisted with ideas for decrease back  Pain ,  Assist with appointments   Aengus Sauceda L. Lavina Hamman, RN, BSN, [facility] [phone]

## 2017-06-30 NOTE — Procedures (Signed)
[place] A. Merlene Laughter, MD     [email or web address]             NOCTURNAL POLYSOMNOGRAPHY   LOCATION: [facility]   Patient Name: Krystal [name], Krystal [name] Date: 06/14/2017 Gender: Female D.O.B: 01/15/1936 Age (years): 81 Referring Provider: Sinda Du Height (inches): 73 Interpreting Physician: Phillips Odor MD, ABSM Weight (lbs): 200 RPSGT: Peak, Robert BMI: 32 MRN: [id number] Neck Size: 18.00 CLINICAL INFORMATION Sleep Study Type: NPSG  Indication for sleep study: OSA  Epworth Sleepiness Score: 11  SLEEP STUDY TECHNIQUE As per the AASM Manual for the Scoring of Sleep and Associated Events v2.3 (April 2016) with a hypopnea requiring 4% desaturations.  The channels recorded and monitored were frontal, central and occipital EEG, electrooculogram (EOG), submentalis EMG (chin), nasal and oral airflow, thoracic and abdominal wall motion, anterior tibialis EMG, snore microphone, electrocardiogram, and pulse oximetry.  MEDICATIONS Medications self-administered by patient taken the night of the study : N/A  Current Outpatient Prescriptions:  .  aspirin 81 MG EC tablet, Take 81 mg by mouth every morning. , Disp: , Rfl:  .  BD PEN NEEDLE NANO U/F 32G X 4 MM MISC, USE AS DIRECTED TWICE DAILY., Disp: 60 each, Rfl: 10 .  budesonide-formoterol (SYMBICORT) 160-4.5 MCG/ACT inhaler, Inhale 2 puffs into the lungs 2 (two) times daily., Disp: , Rfl:  .  calcitRIOL (ROCALTROL) 0.25 MCG capsule, Take 0.25 mcg by mouth daily. Reported on 03/30/2016, Disp: , Rfl:  .  clopidogrel (PLAVIX) 75 MG tablet, TAKE 1 TABLET ONCE DAILY., Disp: 30 tablet, Rfl: 3 .  Continuous Blood Gluc Sensor ([place]) MISC, Use one sensor every 10 days., Disp: 3 each, Rfl: 2 .  ezetimibe (ZETIA) 10 MG tablet, Take 10 mg by mouth daily., Disp: , Rfl:  .  famotidine (PEPCID) 20 MG tablet, Take 1 tablet (20 mg total) by mouth daily., Disp: 30 tablet, Rfl: 1 .   Fluticasone-Salmeterol (ADVAIR DISKUS) 250-50 MCG/DOSE AEPB, Inhale 1 puff into the lungs 2 (two) times daily., Disp: 60 each, Rfl: 1 .  glucose blood (ACCU-CHEK GUIDE) test strip, Use as instructed, Disp: 150 each, Rfl: 2 .  Insulin Aspart Prot & Aspart (NOVOLOG MIX 70/30 FLEXPEN [place]), Inject 40-60 Units into the skin 2 (two) times daily with a meal., Disp: , Rfl:  .  Insulin Pen Needle (B-D ULTRAFINE III SHORT PEN) 31G X 8 MM MISC, 1 each by Does not apply route as directed., Disp: 100 each, Rfl: 3 .  isosorbide mononitrate (IMDUR) 60 MG 24 hr tablet, TAKE 1 TABLET ONCE DAILY., Disp: 30 tablet, Rfl: 3 .  Lancets (ACCU-CHEK SOFT TOUCH) lancets, Use as instructed bid. E11.65., Disp: 100 each, Rfl: 2 .  lisinopril (PRINIVIL,ZESTRIL) 20 MG tablet, Take 1 tablet (20 mg total) by mouth daily., Disp: 90 tablet, Rfl: 3 .  meclizine (ANTIVERT) 25 MG tablet, Take 25 mg by mouth 4 (four) times daily as needed for dizziness., Disp: , Rfl:  .  nitroGLYCERIN (NITROSTAT) 0.4 MG SL tablet, Place 1 tablet (0.4 mg total) under the tongue every 5 (five) minutes as needed for chest pain., Disp: 25 tablet, Rfl: 3 .  NOVOLOG MIX 70/30 FLEXPEN (70-30) 100 UNIT/ML FlexPen, INJECT 50 UNITS IN THE MONRING AND 40 UNITS IN THE EVENING., Disp: 30 mL, Rfl: 10 .  polyethylene glycol (MIRALAX / GLYCOLAX) packet, Take 17 g by mouth daily as needed for mild constipation., Disp: 30 each, Rfl: 0 .  ranolazine (RANEXA) 1000 MG SR tablet, Take 1  tablet (1,000 mg total) by mouth 2 (two) times daily., Disp: 60 tablet, Rfl: 6 .  rosuvastatin (CRESTOR) 20 MG tablet, Take 1 tablet (20 mg total) by mouth every evening., Disp: 30 tablet, Rfl: 1 .  sennosides-docusate sodium (SENOKOT-S) 8.6-50 MG tablet, Take 1 tablet by mouth 2 (two) times daily before a meal., Disp: , Rfl:  .  torsemide (DEMADEX) 20 MG tablet, MAY TAKE EXTRA 20MG  DAILY FOR SWELLING., Disp: 30 tablet, Rfl: 3   SLEEP ARCHITECTURE The study was initiated at 9:56:42 PM and  ended at 5:17:25 AM.  Sleep onset time was 75.0 minutes and the sleep efficiency was 76.6%. The total sleep time was 337.5 minutes.  Stage REM latency was 106.0 minutes.  The patient spent 3.70% of the night in stage N1 sleep, 78.07% in stage N2 sleep, 0.00% in stage N3 and 18.22% in REM.  Alpha intrusion was absent.  Supine sleep was 84.59%.  RESPIRATORY PARAMETERS The overall apnea/hypopnea index (AHI) was 11.0 per hour. There were 0 total apneas, including 0 obstructive, 0 central and 0 mixed apneas. There were 62 hypopneas and 2 RERAs.  The AHI during Stage REM sleep was 38.0 per hour.  AHI while supine was 12.2 per hour.  The mean oxygen saturation was 92.50%. The minimum SpO2 during sleep was 76.00%.  Moderate snoring was noted during this study.  CARDIAC DATA The 2 lead EKG demonstrated sinus rhythm. The mean heart rate was 87.45 beats per minute. Other EKG findings include: PVCs. LEG MOVEMENT DATA The total PLMS were 0 with a resulting PLMS index of 0.00. Associated arousal with leg movement index was 0.0.  IMPRESSIONS - Mild obstructive sleep apnea is observed during this recording. CPAP titration study is recommended. - Absent slow-wave sleep is observed.  Delano Metz, MD Diplomate, American Board of Sleep Medicine.  ELECTRONICALLY SIGNED ON:  06/30/2017, 9:49 AM [place] PH: [phone]   FX: [phone] [place]

## 2017-07-02 NOTE — Patient Outreach (Signed)
[facility]) Care Management  05/14/2017  Krystal [name] 05-11-1936 [id number]   Care coordination  [facility] CM contact with [name] for follow up with her and to answer questions about her appointments and DME  Plans to see [name] in 1-2 weeks  Joelene Millin L. Lavina Hamman, RN, BSN, [place] [facility] Management [phone]

## 2017-07-03 ENCOUNTER — Ambulatory Visit (HOSPITAL_COMMUNITY)
Admission: RE | Admit: 2017-07-03 | Discharge: 2017-07-03 | Disposition: A | Discharge status: Home / Self Care | Payer: Medicare Other | Source: Ambulatory Visit | Attending: Family Medicine | Admitting: Family Medicine

## 2017-07-03 DIAGNOSIS — Z1231 Encounter for screening mammogram for malignant neoplasm of breast: Secondary | ICD-10-CM | POA: Insufficient documentation
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
## 2017-07-05 ENCOUNTER — Encounter: Payer: Self-pay | Admitting: Cardiovascular Disease

## 2017-07-05 ENCOUNTER — Telehealth: Payer: Self-pay | Admitting: *Deleted

## 2017-07-05 ENCOUNTER — Ambulatory Visit (INDEPENDENT_AMBULATORY_CARE_PROVIDER_SITE_OTHER): Payer: Medicare Other | Admitting: Cardiovascular Disease

## 2017-07-05 VITALS — BP 168/78 | HR 95 | Ht 66.0 in | Wt 200.2 lb

## 2017-07-05 DIAGNOSIS — R5383 Other fatigue: Secondary | ICD-10-CM | POA: Diagnosis not present

## 2017-07-05 DIAGNOSIS — I10 Essential (primary) hypertension: Secondary | ICD-10-CM | POA: Diagnosis not present

## 2017-07-05 DIAGNOSIS — J438 Other emphysema: Secondary | ICD-10-CM | POA: Diagnosis not present

## 2017-07-05 DIAGNOSIS — I25118 Atherosclerotic heart disease of native coronary artery with other forms of angina pectoris: Secondary | ICD-10-CM | POA: Diagnosis not present

## 2017-07-05 DIAGNOSIS — I209 Angina pectoris, unspecified: Secondary | ICD-10-CM

## 2017-07-05 DIAGNOSIS — I208 Other forms of angina pectoris: Secondary | ICD-10-CM

## 2017-07-05 DIAGNOSIS — I5032 Chronic diastolic (congestive) heart failure: Secondary | ICD-10-CM

## 2017-07-05 DIAGNOSIS — G4733 Obstructive sleep apnea (adult) (pediatric): Secondary | ICD-10-CM

## 2017-07-05 DIAGNOSIS — E785 Hyperlipidemia, unspecified: Secondary | ICD-10-CM | POA: Diagnosis not present

## 2017-07-05 DIAGNOSIS — E7800 Pure hypercholesterolemia, unspecified: Secondary | ICD-10-CM | POA: Diagnosis not present

## 2017-07-05 NOTE — Patient Instructions (Signed)
Medication Instructions:  Your physician recommends that you continue on your current medications as directed. Please refer to the Current Medication list given to you today.   Labwork: NONE  Testing/Procedures: NONE  Follow-Up: Your physician wants you to follow-up in: 6 Month with Dr. Bronson Ing.  You will receive a reminder letter in the mail two months in advance. If you don't receive a letter, please call our office to schedule the follow-up appointment.   Any Other Special Instructions Will Be Listed Below (If Applicable).     If you need a refill on your cardiac medications before your next appointment, please call your pharmacy. Thank you for choosing [facility]!

## 2017-07-05 NOTE — Progress Notes (Signed)
SUBJECTIVE: The patient presents for routine cardiovascular followup. In summary, she has a history of chronic diastolic heart failure, hypertension, coronary artery disease with multiple previously placed stents, COPD and is on chronic oxygen at home (2L), sleep apnea and uses CPAP, hyperlipidemia, and insulin-dependent diabetes mellitus.   Echocardiogram on 04/15/2015 demonstrated vigorous left ventricular systolic function, EF 02-77%, severe LVH, grade 1 diastolic dysfunction, elevated filling pressures, trivial mitral regurgitation, trivial tricuspid regurgitation, and normal pulmonary pressures of 27 mmHg.   As per Dr. Evette Georges note on 02/14/14, she has coronary artery disease dating back to the 1990s and has undergone multiple stent procedures involving the proximal LAD, circumflex, mid right coronary artery, and distal right coronary artery. Her last catheterization in October 2013 did not reveal high-grade restenosis. She did have 80-90% ostial stenosis and a small jailed first septal perforating artery. She had mild calcification and 20-30% narrowing in the stent in the circumflex vessel. The right coronary artery had 3 stents and there was 40-50% in-stent smooth narrowing with 30% narrowing in the distal stent.   She is very frustrated today by her lack of sleep. She has been awaiting CPAP but has not been able to obtain it. She is very tired. She is hypertensive. She has had some occasional left hand tingling and numbness for the past 2 days. She has had mild episodic chest pain and attributes it to stress. She underwent a sleep study on 06/14/17 which demonstrated mild obstructive sleep apnea.  She requests to be hospitalized for her lack of sleep.   Review of Systems: As per "subjective", otherwise negative.  Allergies  Allergen Reactions  . Morphine Shortness Of Breath and Swelling  . Penicillins Shortness Of Breath and Swelling    Has patient had a PCN reaction causing  immediate rash, facial/tongue/throat swelling, SOB or lightheadedness with hypotension: No Has patient had a PCN reaction causing severe rash involving mucus membranes or skin necrosis: No Has patient had a PCN reaction that required hospitalization Yes Has patient had a PCN reaction occurring within the last 10 years: No If all of the above answers are "NO", then may proceed with Cephalosporin use.   . Shellfish Allergy     Current Outpatient Prescriptions  Medication Sig Dispense Refill  . aspirin 81 MG EC tablet Take 81 mg by mouth every morning.     . BD PEN NEEDLE NANO U/F 32G X 4 MM MISC USE AS DIRECTED TWICE DAILY. 60 each 10  . budesonide-formoterol (SYMBICORT) 160-4.5 MCG/ACT inhaler Inhale 2 puffs into the lungs 2 (two) times daily.    . calcitRIOL (ROCALTROL) 0.25 MCG capsule Take 0.25 mcg by mouth daily. Reported on 03/30/2016    . clopidogrel (PLAVIX) 75 MG tablet TAKE 1 TABLET ONCE DAILY. 30 tablet 3  . Continuous Blood Gluc Sensor (FREESTYLE LIBRE SENSOR SYSTEM) MISC Use one sensor every 10 days. 3 each 2  . ezetimibe (ZETIA) 10 MG tablet Take 10 mg by mouth daily.    . famotidine (PEPCID) 20 MG tablet Take 1 tablet (20 mg total) by mouth daily. 30 tablet 1  . Fluticasone-Salmeterol (ADVAIR DISKUS) 250-50 MCG/DOSE AEPB Inhale 1 puff into the lungs 2 (two) times daily. 60 each 1  . glucose blood (ACCU-CHEK GUIDE) test strip Use as instructed 150 each 2  . Insulin Aspart Prot & Aspart (NOVOLOG MIX 70/30 FLEXPEN [place]) Inject 40-60 Units into the skin 2 (two) times daily with a meal.    . Insulin Pen Needle (  B-D ULTRAFINE III SHORT PEN) 31G X 8 MM MISC 1 each by Does not apply route as directed. 100 each 3  . isosorbide mononitrate (IMDUR) 60 MG 24 hr tablet TAKE 1 TABLET ONCE DAILY. 30 tablet 3  . Lancets (ACCU-CHEK SOFT TOUCH) lancets Use as instructed bid. E11.65. 100 each 2  . lisinopril (PRINIVIL,ZESTRIL) 20 MG tablet Take 1 tablet (20 mg total) by mouth daily. 90 tablet 3  .  meclizine (ANTIVERT) 25 MG tablet Take 25 mg by mouth 4 (four) times daily as needed for dizziness.    . nitroGLYCERIN (NITROSTAT) 0.4 MG SL tablet Place 1 tablet (0.4 mg total) under the tongue every 5 (five) minutes as needed for chest pain. 25 tablet 3  . NOVOLOG MIX 70/30 FLEXPEN (70-30) 100 UNIT/ML FlexPen INJECT 50 UNITS IN THE MONRING AND 40 UNITS IN THE EVENING. 30 mL 10  . polyethylene glycol (MIRALAX / GLYCOLAX) packet Take 17 g by mouth daily as needed for mild constipation. 30 each 0  . ranolazine (RANEXA) 1000 MG SR tablet Take 1 tablet (1,000 mg total) by mouth 2 (two) times daily. 60 tablet 6  . rosuvastatin (CRESTOR) 20 MG tablet Take 1 tablet (20 mg total) by mouth every evening. 30 tablet 1  . sennosides-docusate sodium (SENOKOT-S) 8.6-50 MG tablet Take 1 tablet by mouth 2 (two) times daily before a meal.    . torsemide (DEMADEX) 20 MG tablet Take 20 mg by mouth daily. MAY TAKE EXTRA 20MG  DAILY FOR SWELLING.     No current facility-administered medications for this visit.     Past Medical History:  Diagnosis Date  . Allergic rhinitis   . CAD (coronary artery disease) 2009   5 stents- #3 in RCA, #1 each in LAD and AVG  . CHF (congestive heart failure) ([place])   . CKD (chronic kidney disease) stage 3, GFR 30-59 ml/min   . Complete heart block, transient 2014  . Diastolic dysfunction, left ventricle   . DJD (degenerative joint disease)   . Emphysema lung (H[facility])    2L N/C continuously  . Essential hypertension   . GERD (gastroesophageal reflux disease)   . Glaucoma   . Gout   . History of uterine cancer   . HOH (hard of hearing)   . Hyperlipidemia   . MI (myocardial infarction) ([place]) 1998  . Microcytic anemia    History of occult blood in stool  . Myalgia   . Osteoarthritis   . Palpitations   . Personal history of colonic polyps   . Sleep apnea    CPAP  . Type II diabetes mellitus with nephropathy [facility])     Past Surgical History:  Procedure Laterality Date  .  ABDOMINAL HYSTERECTOMY  04/2010   Uterine cancer,TAHBSO  . CERVICAL DISCECTOMY     L5 left/hemilminectomy  . COLONOSCOPY  09/2006   Int hemmorhoids, COMPLICATED BY CARDIOPULMONARY COMPLICATIONS  . CORONARY ANGIOPLASTY  1/99, 1/07, 1/08, 4/09   5 cardiac stents total  . ECTOPIC PREGNANCY SURGERY    . FOOT SURGERY     Left and right for callous  . LEFT AND RIGHT HEART CATHETERIZATION WITH CORONARY ANGIOGRAM N/A 09/12/2012   Procedure: LEFT AND RIGHT HEART CATHETERIZATION WITH CORONARY ANGIOGRAM;  Surgeon: Sanda Klein, MD;  Location: [facility] CATH LAB;  Service: Cardiovascular;  Laterality: N/A;  . POLYPECTOMY  10/22/2011   Internal hemorrhoids/sessile polyp    Social History   Social History  . Marital status: Widowed    Spouse name: N/A  .  Number of children: 0  . Years of education: 12   Occupational History  .      retired   Social History Main Topics  . Smoking status: Former Smoker    Packs/day: 0.40    Years: 51.00    Types: Cigarettes    Start date: 04/22/1955    Quit date: 10/18/2006  . Smokeless tobacco: Never Used     Comment: quit about 4 yrs ago  . Alcohol use No  . Drug use: No  . Sexual activity: No   Other Topics Concern  . Not on file   Social History Narrative   Consumes 2 cups of caffeine daily     Vitals:   07/05/17 1305  BP: (!) 168/78  Pulse: 95  SpO2: 95%  Weight: 200 lb 3.2 oz (90.8 kg)  Height: 5\' 6"  (1.676 m)    Wt Readings from Last 3 Encounters:  07/05/17 200 lb 3.2 oz (90.8 kg)  06/20/17 199 lb (90.3 kg)  06/14/17 197 lb (89.4 kg)     PHYSICAL EXAM General: NAD HEENT: Normal. Neck: No JVD, no thyromegaly. Lungs: Diminished but clear b/l. No rales or wheezes appreciated.  CV: Nondisplaced PMI.  Regular rate and rhythm, normal S1/S2, no S3/S4, no murmur. No pretibial or periankle edema.  No carotid bruit.   Abdomen: Soft, nontender, no distention.  Neurologic: Alert and oriented.  Psych: Frustrated. Skin:  Normal. Musculoskeletal: No gross deformities.    ECG: Most recent ECG reviewed.   Labs: Lab Results  Component Value Date/Time   K 3.8 06/17/2017 11:16 AM   BUN 26 (H) 06/17/2017 11:16 AM   BUN 44 (H) 11/01/2014 03:09 PM   CREATININE 2.26 (H) 06/17/2017 11:16 AM   ALT 11 06/17/2017 11:16 AM   TSH 3.80 05/07/2016 10:06 AM   HGB 9.9 (L) 11/08/2015 05:47 AM     Lipids: Lab Results  Component Value Date/Time   LDLCALC 71 05/07/2016 10:06 AM   LDLDIRECT 118.4 08/13/2013 03:02 PM   CHOL 151 05/07/2016 10:06 AM   TRIG 217 (H) 05/07/2016 10:06 AM   HDL 37 (L) 05/07/2016 10:06 AM       ASSESSMENT AND PLAN:  1. Chronic diastolic heart failure: She is currently euvolemic and stable. Blood pressure is elevated. I will aim to control this. It is likely due to frustration and lack of sleep. Continue torsemide 20 mg. Instructed to take extra 20 mg for increased leg swelling.  2. CAD with angina: Symptomatically stable. No exertional chest pain. Continue Ranexa 1000 mg twice daily along with aspirin, Plavix, Zetia, Imdur, and Crestor. She underwent a low risk normal nuclear stress test in September 2014.  3. Essential hypertension: Elevated. It is likely due to frustration and lack of sleep. She prefers to avoid med changes at present.  4. Hyperlipidemia: Continue current therapy with Crestor and Zetia.   5. Mild OSA: I will ask my nursing staff to see if they can contact the appropriate people to find out why she has not received CPAP.     Disposition: Follow up 6 months   Kate Sable, M.D., F.A.C.C.

## 2017-07-05 NOTE — Telephone Encounter (Signed)
Pt. In office for appt. Unsure of the reason she has not received her CPAP machine. [facility] has not received results of the sleep study. Pt notified via home voice mail.

## 2017-07-15 DIAGNOSIS — I10 Essential (primary) hypertension: Secondary | ICD-10-CM | POA: Diagnosis not present

## 2017-07-15 DIAGNOSIS — D649 Anemia, unspecified: Secondary | ICD-10-CM | POA: Diagnosis not present

## 2017-07-15 DIAGNOSIS — E1165 Type 2 diabetes mellitus with hyperglycemia: Secondary | ICD-10-CM | POA: Diagnosis not present

## 2017-07-15 DIAGNOSIS — J449 Chronic obstructive pulmonary disease, unspecified: Secondary | ICD-10-CM | POA: Diagnosis not present

## 2017-07-16 DIAGNOSIS — I10 Essential (primary) hypertension: Secondary | ICD-10-CM | POA: Diagnosis not present

## 2017-07-16 DIAGNOSIS — D649 Anemia, unspecified: Secondary | ICD-10-CM | POA: Diagnosis not present

## 2017-07-16 DIAGNOSIS — E1165 Type 2 diabetes mellitus with hyperglycemia: Secondary | ICD-10-CM | POA: Diagnosis not present

## 2017-07-16 DIAGNOSIS — J449 Chronic obstructive pulmonary disease, unspecified: Secondary | ICD-10-CM | POA: Diagnosis not present

## 2017-07-19 ENCOUNTER — Other Ambulatory Visit: Payer: Self-pay | Admitting: *Deleted

## 2017-07-19 NOTE — Patient Outreach (Signed)
[facility]) Care Management   07/19/2017  Krystal [name] 1936-08-14 [id number]  Krystal [name] is an 81 y.o. female with diabetes mellitus, nephropathy, peripheral neuropathy,dyslipidemia, hypertension, and PVD. [name] was originally referred to [place] [facility] for transition of care services after a hospital admission in December 2017/January 2018 for hypertensive crisis. Most recently [place] [facility] has been assisting [name] with medication management and assistance and diabetes management and education.  [name] has not been hospitalized nor to ED in the last 6 months   Subjective:  See notes below  Objective:   BP (!) 142/78   Pulse 78   Temp (!) 97.5 F (36.4 C) (Oral)   Resp 18   SpO2 98%  Review of Systems  Constitutional: Negative for chills, diaphoresis, fever, malaise/fatigue and weight loss.  HENT: Negative for congestion, ear discharge, ear pain, hearing loss, nosebleeds, sinus pain, sore throat and tinnitus.   Eyes: Negative.  Negative for blurred vision, double vision, photophobia, pain, discharge and redness.  Respiratory: Negative.  Negative for cough, hemoptysis, sputum production, shortness of breath, wheezing and stridor.   Cardiovascular: Positive for leg swelling. Negative for chest pain, palpitations, orthopnea, claudication and PND.  Gastrointestinal: Positive for constipation. Negative for abdominal pain, blood in stool, diarrhea, heartburn, melena, nausea and vomiting.  Genitourinary: Positive for flank pain, frequency and urgency. Negative for dysuria and hematuria.  Musculoskeletal: Positive for falls, joint pain and myalgias. Negative for back pain and neck pain.       No bruising noted on area of fall - buttocks/posterior She denies injury  Skin: Negative.  Negative for itching and rash.  Neurological: Positive for weakness and headaches. Negative for dizziness, tingling, tremors, sensory change, speech  change, focal weakness, seizures and loss of consciousness.  Endo/Heme/Allergies: Negative.  Negative for environmental allergies and polydipsia. Does not bruise/bleed easily.  Psychiatric/Behavioral: Negative for depression, hallucinations, memory loss, substance abuse and suicidal ideas. The patient is not nervous/anxious and does not have insomnia.     Physical Exam  Constitutional: She is oriented to person, place, and time. She appears well-developed and well-nourished.  HENT:  Head: Normocephalic and atraumatic.  Poor dentition; continues to report need to have more teeth removed but does not have the money  Neck: Normal range of motion. Neck supple.  Cardiovascular: Normal rate and regular rhythm.  Respiratory: Effort normal and breath sounds normal.  GI: Soft.  Musculoskeletal: She exhibits edema.  Neurological: She is alert and oriented to person, place, and time.  Skin: Skin is warm and dry.  Psychiatric: She has a normal mood and affect. Her behavior is normal. Judgment and thought content normal.    Encounter Medications:   Outpatient Encounter Prescriptions as of 07/19/2017  Medication Sig Note  . aspirin 81 MG EC tablet Take 81 mg by mouth every morning.    . BD PEN NEEDLE NANO U/F 32G X 4 MM MISC USE AS DIRECTED TWICE DAILY.   . budesonide-formoterol (SYMBICORT) 160-4.5 MCG/ACT inhaler Inhale 2 puffs into the lungs 2 (two) times daily.   . calcitRIOL (ROCALTROL) 0.25 MCG capsule Take 0.25 mcg by mouth daily. Reported on 03/30/2016   . clopidogrel (PLAVIX) 75 MG tablet TAKE 1 TABLET ONCE DAILY.   Marland Kitchen Continuous Blood Gluc Sensor (FREESTYLE LIBRE SENSOR SYSTEM) MISC Use one sensor every 10 days.   Marland Kitchen ezetimibe (ZETIA) 10 MG tablet Take 10 mg by mouth daily.   . famotidine (PEPCID) 20 MG tablet Take 1 tablet (  20 mg total) by mouth daily.   . Fluticasone-Salmeterol (ADVAIR DISKUS) 250-50 MCG/DOSE AEPB Inhale 1 puff into the lungs 2 (two) times daily.   Marland Kitchen glucose blood (ACCU-CHEK  GUIDE) test strip Use as instructed   . Insulin Aspart Prot & Aspart (NOVOLOG MIX 70/30 FLEXPEN ) Inject 40-60 Units into the skin 2 (two) times daily with a meal.   . Insulin Pen Needle (B-D ULTRAFINE III SHORT PEN) 31G X 8 MM MISC 1 each by Does not apply route as directed.   . isosorbide mononitrate (IMDUR) 60 MG 24 hr tablet TAKE 1 TABLET ONCE DAILY.   Marland Kitchen Lancets (ACCU-CHEK SOFT TOUCH) lancets Use as instructed bid. E11.65.   Marland Kitchen lisinopril (PRINIVIL,ZESTRIL) 20 MG tablet Take 1 tablet (20 mg total) by mouth daily.   . meclizine (ANTIVERT) 25 MG tablet Take 25 mg by mouth 4 (four) times daily as needed for dizziness.   . nitroGLYCERIN (NITROSTAT) 0.4 MG SL tablet Place 1 tablet (0.4 mg total) under the tongue every 5 (five) minutes as needed for chest pain. 04/30/2016: Has on hand if needed.   Marland Kitchen NOVOLOG MIX 70/30 FLEXPEN (70-30) 100 UNIT/ML FlexPen INJECT 50 UNITS IN THE MONRING AND 40 UNITS IN THE EVENING.   Marland Kitchen polyethylene glycol (MIRALAX / GLYCOLAX) packet Take 17 g by mouth daily as needed for mild constipation. 04/30/2016: Takes if needed  . ranolazine (RANEXA) 1000 MG SR tablet Take 1 tablet (1,000 mg total) by mouth 2 (two) times daily.   . rosuvastatin (CRESTOR) 20 MG tablet Take 1 tablet (20 mg total) by mouth every evening.   . sennosides-docusate sodium (SENOKOT-S) 8.6-50 MG tablet Take 1 tablet by mouth 2 (two) times daily before a meal.   . torsemide (DEMADEX) 20 MG tablet Take 20 mg by mouth daily. MAY TAKE EXTRA 20MG DAILY FOR SWELLING.    No facility-administered encounter medications on file as of 07/19/2017.     Functional Status:   In your present state of health, do you have any difficulty performing the following activities: 06/14/2017 05/31/2017  Hearing? N N  Vision? N N  Difficulty concentrating or making decisions? N N  Walking or climbing stairs? Y Y  Dressing or bathing? N N  Doing errands, shopping? [name]  Preparing Food and eating ? N N  Using the Toilet? N N  In  the past six months, have you accidently leaked urine? N N  Do you have problems with loss of bowel control? N N  Managing your Medications? N N  Managing your Finances? N N  Housekeeping or managing your Housekeeping? - Y  Some recent data might be hidden    Fall/Depression Screening:    Fall Risk  06/20/2017 05/31/2017 05/31/2017  Falls in the past year? No - No  Number falls in past yr: - - -  Injury with Fall? - - -  Risk Factor Category  - - -  Risk for fall due to : - (No Data) Impaired mobility;Impaired balance/gait;Medication side effect;Other (Comment)  Risk for fall due to: Comment - on antivert history of dizziness on   Follow up - - -   PHQ 2/9 Scores 06/20/2017 06/17/2017 05/31/2017 01/31/2017 09/25/2016 05/11/2016 04/13/2016  PHQ - 2 Score 0 0 0 0 0 0 0    Assessment:   CM met with Mrs Gamino in her living room of her apartment  She reports issues with feeling like she wanted to void frequently but reports issues have resolved.  CM  questioned if she think she may have a urinary tract infection (UTI). CM educated her on the symptoms of UTI.  CM asked if she had consulted [name] office and she had not.  Cm strongly encouraged her to increase fluid intake, use good perineal care, call the office to speak with [name] or his RN. CM reminded her of the availability to use the 24 hour nurse line services, call to [name] office or to [facility] during business hours  COPD Reports she believes she has a follow up appointment with Dr Luan Pulling, pulmonologist, on Thursday July 28 2017(?)  She denies shortness of breath today  Constipation Mrs Chamblee continues to report issues with constipation she reports taking miralax today and she recalls having her last BM about a week ago.  This has been a chronic issue for Mrs Rigdon. This concern has been reviewed with her previously during home visits.  Again Cm encouraged eating vegetables, increase fluids, stool softener, soft foods and taking miralax as  [name] has recommended    Fall reported without injury going to the bathroom.. On exam no bruising noted of her posterior. CM discussed safety precautions, carrying her mobile phone with her at all times in her pocket for emergencies, and reporting these issues to Cm, 24 hour nurse line, or [name] office.  Diabetes She reports no changes in her diabetes management at home CM had her to take her cbg during the home visit and it was 187 She reports she was hungry so Cm assisted her with getting food to eat during the home visit   Social Mrs Mellor still voices concern about her mattress she states is not adequate for resting.  She got letter about the mattress from the furniture company she purchased it from.  She requested CM to read it for her to help her understand the contents.  Cm read the letter out loud and informed her the company states the mattress had a stain on it and it could not be returned with the stain.  She informs CM the mattress does not have a stain. CM referred her to the contact number for the company and address and encouraged her to call or write a letter informing the company of her concerns.   Plan: Cm will follow up with Mrs Bolduc in 2-4 weeks for home visit    [facility] CM Care Plan Problem One     Most Recent Value  Care Plan Problem One  Knowledge deficit related to diabetes as evidenced by patient questions around diabetes  Role Documenting the Problem One  Care Management [place] for Problem One  Active  [facility] Long Term Goal   Patient will verbalize understanding of diabetes management over the next 60 days  [facility] Long Term Goal Start Date  06/14/17  Interventions for Problem One Long Term Goal  Re reviewed how to check cbg and how to give insulin,  discussed current plan of care,  reviewed upcoming appointment with CDE/RD,  reviewed MD recommendations  [facility] CM Short Term Goal #1   Over the next 30 days, patient will verbalize understanding of which  medications are used to treat her diabetes  [facility] CM Short Term Goal #1 Start Date  01/24/17  [facility] CM Short Term Goal #1 Met Date  02/28/17  Interventions for Short Term Goal #1  Medication review planned for next week's home visit  [facility] CM Short Term Goal #2   Over the next 30  days, patient will check cbg's daily and record as prescribed  [facility] CM Short Term Goal #2 Start Date  01/24/17  [facility] CM Short Term Goal #2 Met Date  02/28/17  Interventions for Short Term Goal #2  Reviewed prescribed plan to check cbg's daily,  plan review on next week's home visit  [facility] CM Short Term Goal #3  over the next 30 days, patient will verbalize understanding of basic components of prescribed carb modified diet  [facility] CM Short Term Goal #3 Start Date  01/24/17  [facility] CM Short Term Goal #3 Met Date  02/28/17  Interventions for Short Tern Goal #3  discussed carb modified diet,  prescribed [place]i educaitonal materials,  planned detailed review for next week's home visit      [place]. Lavina Hamman, RN, BSN, [place] Coordinator [phone] week day mobile

## 2017-07-26 ENCOUNTER — Other Ambulatory Visit (HOSPITAL_BASED_OUTPATIENT_CLINIC_OR_DEPARTMENT_OTHER): Payer: Self-pay

## 2017-07-26 DIAGNOSIS — G4733 Obstructive sleep apnea (adult) (pediatric): Secondary | ICD-10-CM

## 2017-08-02 ENCOUNTER — Other Ambulatory Visit: Payer: Self-pay | Admitting: *Deleted

## 2017-08-02 NOTE — Patient Outreach (Signed)
[facility]) Care Management  08/02/2017  Krystal [name] 1936/09/05 [id number]   Care coordination  [facility] CM contacted Krystal [name] to follow up with her.  She reports she is doing well related to her DM, COPD, and HTN. Denies any medical symptoms.     Krystal [name] discussed her frustration with receiving a letter about a determination about her mattress.  She voiced not being pleased with the letter's content.  Krystal [name] has requested [facility] CM review the letter at a later time. Krystal [name] and [facility] CM discussed the upcoming inclement weather- Krystal [name] related to home visit versus telephonic assessment contact [facility] CM discussed with her that [facility] CM had spoken with Krystal [name] office staff about her upcoming office visit and confirmed she is scheduled to see Krystal [name] in October 2018 not September 2018.  Krystal [name] voiced understanding   Plans To see Krystal [name] on next week for follow up home visit   [place]. Lavina Hamman, RN, BSN, [place] [facility] Management [phone]

## 2017-08-08 ENCOUNTER — Ambulatory Visit: Payer: Medicare Other | Attending: Pulmonary Disease | Admitting: Neurology

## 2017-08-08 DIAGNOSIS — Z79899 Other long term (current) drug therapy: Secondary | ICD-10-CM | POA: Insufficient documentation

## 2017-08-08 DIAGNOSIS — Z794 Long term (current) use of insulin: Secondary | ICD-10-CM | POA: Diagnosis not present

## 2017-08-08 DIAGNOSIS — G4733 Obstructive sleep apnea (adult) (pediatric): Secondary | ICD-10-CM | POA: Insufficient documentation

## 2017-08-08 DIAGNOSIS — Z7982 Long term (current) use of aspirin: Secondary | ICD-10-CM | POA: Insufficient documentation

## 2017-08-08 DIAGNOSIS — R0689 Other abnormalities of breathing: Secondary | ICD-10-CM | POA: Diagnosis not present

## 2017-08-18 NOTE — Procedures (Signed)
[place] A. Merlene Laughter, MD     [email or web address]             NOCTURNAL POLYSOMNOGRAPHY   LOCATION: [facility]  Patient Name: Krystal [name], Krystal [name] Date: 08/08/2017 Gender: Female D.O.B: [birth year] Age (years): 81 Referring Provider: Sinda Du Height (inches): 67 Interpreting Physician: Phillips Odor MD, ABSM Weight (lbs): 200 RPSGT: Peak, Robert BMI: 32 MRN: [id number] Neck Size: 18.00 CLINICAL INFORMATION The patient is referred for a CPAP titration to treat sleep apnea.  Date of NPSG, Split Night or HST:  SLEEP STUDY TECHNIQUE As per the AASM Manual for the Scoring of Sleep and Associated Events v2.3 (April 2016) with a hypopnea requiring 4% desaturations.  The channels recorded and monitored were frontal, central and occipital EEG, electrooculogram (EOG), submentalis EMG (chin), nasal and oral airflow, thoracic and abdominal wall motion, anterior tibialis EMG, snore microphone, electrocardiogram, and pulse oximetry. Continuous positive airway pressure (CPAP) was initiated at the beginning of the study and titrated to treat sleep-disordered breathing.  MEDICATIONS Medications self-administered by patient taken the night of the study : N/A  Current Outpatient Prescriptions:  .  aspirin 81 MG EC tablet, Take 81 mg by mouth every morning. , Disp: , Rfl:  .  BD PEN NEEDLE NANO U/F 32G X 4 MM MISC, USE AS DIRECTED TWICE DAILY., Disp: 60 each, Rfl: 10 .  budesonide-formoterol (SYMBICORT) 160-4.5 MCG/ACT inhaler, Inhale 2 puffs into the lungs 2 (two) times daily., Disp: , Rfl:  .  calcitRIOL (ROCALTROL) 0.25 MCG capsule, Take 0.25 mcg by mouth daily. Reported on 03/30/2016, Disp: , Rfl:  .  clopidogrel (PLAVIX) 75 MG tablet, TAKE 1 TABLET ONCE DAILY., Disp: 30 tablet, Rfl: 3 .  Continuous Blood Gluc Sensor ([place]) MISC, Use one sensor every 10 days., Disp: 3 each, Rfl: 2 .  ezetimibe (ZETIA) 10 MG tablet, Take 10 mg by  mouth daily., Disp: , Rfl:  .  famotidine (PEPCID) 20 MG tablet, Take 1 tablet (20 mg total) by mouth daily., Disp: 30 tablet, Rfl: 1 .  Fluticasone-Salmeterol (ADVAIR DISKUS) 250-50 MCG/DOSE AEPB, Inhale 1 puff into the lungs 2 (two) times daily., Disp: 60 each, Rfl: 1 .  glucose blood (ACCU-CHEK GUIDE) test strip, Use as instructed, Disp: 150 each, Rfl: 2 .  Insulin Aspart Prot & Aspart (NOVOLOG MIX 70/30 FLEXPEN [place]), Inject 40-60 Units into the skin 2 (two) times daily with a meal., Disp: , Rfl:  .  Insulin Pen Needle (B-D ULTRAFINE III SHORT PEN) 31G X 8 MM MISC, 1 each by Does not apply route as directed., Disp: 100 each, Rfl: 3 .  isosorbide mononitrate (IMDUR) 60 MG 24 hr tablet, TAKE 1 TABLET ONCE DAILY., Disp: 30 tablet, Rfl: 3 .  Lancets (ACCU-CHEK SOFT TOUCH) lancets, Use as instructed bid. E11.65., Disp: 100 each, Rfl: 2 .  lisinopril (PRINIVIL,ZESTRIL) 20 MG tablet, Take 1 tablet (20 mg total) by mouth daily., Disp: 90 tablet, Rfl: 3 .  meclizine (ANTIVERT) 25 MG tablet, Take 25 mg by mouth 4 (four) times daily as needed for dizziness., Disp: , Rfl:  .  nitroGLYCERIN (NITROSTAT) 0.4 MG SL tablet, Place 1 tablet (0.4 mg total) under the tongue every 5 (five) minutes as needed for chest pain., Disp: 25 tablet, Rfl: 3 .  NOVOLOG MIX 70/30 FLEXPEN (70-30) 100 UNIT/ML FlexPen, INJECT 50 UNITS IN THE MONRING AND 40 UNITS IN THE EVENING., Disp: 30 mL, Rfl: 10 .  polyethylene glycol (MIRALAX / GLYCOLAX) packet, Take 17  g by mouth daily as needed for mild constipation., Disp: 30 each, Rfl: 0 .  ranolazine (RANEXA) 1000 MG SR tablet, Take 1 tablet (1,000 mg total) by mouth 2 (two) times daily., Disp: 60 tablet, Rfl: 6 .  rosuvastatin (CRESTOR) 20 MG tablet, Take 1 tablet (20 mg total) by mouth every evening., Disp: 30 tablet, Rfl: 1 .  sennosides-docusate sodium (SENOKOT-S) 8.6-50 MG tablet, Take 1 tablet by mouth 2 (two) times daily before a meal., Disp: , Rfl:  .  torsemide (DEMADEX) 20 MG  tablet, Take 20 mg by mouth daily. MAY TAKE EXTRA 20MG  DAILY FOR SWELLING., Disp: , Rfl:    TECHNICIAN COMMENTS Comments added by technician: PATIENT COMES TO SLEEP CENTER FOR CPAP TITRATION. PATIENT RESPONDED WELL TO CPAP PRESSURE WHICH ELIMINATED ALL APNEA AND SNORING. ALL STAGES OF SLEEP WERE OBSERVED. NO ORAL BREATHING THROUGHOUT THE STUDY. ECG SHOWED PVC ARRYTHMIAS. COUPLET, BI AND TRI-GEMINY WERE OBSERVED.  Comments added by scorer: N/A RESPIRATORY PARAMETERS  Optimal PAP Pressure (cm): 8 AHI at Optimal Pressure (/hr): 4.0 Overall Minimal O2 (%): 78.00 Supine % at Optimal Pressure (%): 100 Minimal O2 at Optimal Pressure (%): 82.0       The patient was titrated between pressures 5 and 8.  The patient had the saturations mostly doing REM sleep not associated with apneic events. SLEEP ARCHITECTURE The study was initiated at 10:24:16 PM and ended at 4:55:34 AM.  Sleep onset time was 22.8 minutes and the sleep efficiency was 83.1%. The total sleep time was 325.0 minutes.  The patient spent 5.38% of the night in stage N1 sleep, 68.00% in stage N2 sleep, 3.54% in stage N3 and 23.07% in REM.Stage REM latency was 91.0 minutes  Wake after sleep onset was 43.5. Alpha intrusion was absent. Supine sleep was 100.00%.  CARDIAC DATA The 2 lead EKG demonstrated sinus rhythm. The mean heart rate was 84.53 beats per minute. Other EKG findings include: PVCs. LEG MOVEMENT DATA The total Periodic Limb Movements of Sleep (PLMS) were 0. The PLMS index was 0.00. A PLMS index of <15 is considered normal in adults.  IMPRESSIONS - The optimal CPAP is 8 cm of water.  -  REM related hypoventilation syndrome is observed.  The patient may require supplemental oxygen.  The patient should have a nocturnal oximetry on CPAP 1 month after treatment.    Delano Metz, MD Diplomate, American Board of Sleep Medicine.  ELECTRONICALLY SIGNED ON:  08/18/2017, 9:41 PM [place] PH: [phone]  [phone]   FX: [phone] [place]

## 2017-08-19 DIAGNOSIS — Z23 Encounter for immunization: Secondary | ICD-10-CM | POA: Diagnosis not present

## 2017-08-19 DIAGNOSIS — N189 Chronic kidney disease, unspecified: Secondary | ICD-10-CM | POA: Diagnosis not present

## 2017-08-19 DIAGNOSIS — R51 Headache: Secondary | ICD-10-CM | POA: Diagnosis not present

## 2017-08-19 DIAGNOSIS — I10 Essential (primary) hypertension: Secondary | ICD-10-CM | POA: Diagnosis not present

## 2017-08-19 DIAGNOSIS — E1122 Type 2 diabetes mellitus with diabetic chronic kidney disease: Secondary | ICD-10-CM | POA: Diagnosis not present

## 2017-08-22 ENCOUNTER — Other Ambulatory Visit: Payer: Self-pay | Admitting: *Deleted

## 2017-08-22 NOTE — Patient Outreach (Signed)
[facility]) Care Management   08/22/2017  Krystal [name] Feb 07, 1936 [id number]  Krystal [name] is an 81 y.o. female  with diabetes mellitus, nephropathy, peripheral neuropathy,dyslipidemia, hypertension, and PVD. Krystal [name] was originally referred to [facility] for transition of care services after a hospital admission in December 2017/January 2018 for hypertensive crisis. Most recently [facility] has been assisting Krystal. [name] with medication management and assistance and diabetes management and education.  Krystal [name] has not been hospitalized nor to ED in the last 6 months    Subjective:  See notes below  Objective:   BP 136/78   Pulse 61   Temp (!) 96.8 F (36 C) (Oral)   Resp 20   SpO2 96%  Review of Systems  Constitutional: Negative for chills, diaphoresis, fever, malaise/fatigue and weight loss.  HENT: Positive for congestion and sinus pain. Negative for ear discharge, ear pain, hearing loss, nosebleeds, sore throat and tinnitus.   Eyes: Negative.  Negative for blurred vision, double vision, photophobia, pain, discharge and redness.  Respiratory: Negative for cough, hemoptysis, sputum production, shortness of breath, wheezing and stridor.   Cardiovascular: Positive for leg swelling. Negative for chest pain, palpitations, orthopnea, claudication and PND.  Gastrointestinal: Positive for constipation. Negative for abdominal pain, blood in stool, diarrhea, heartburn, melena, nausea and vomiting.  Genitourinary: Negative.  Negative for dysuria, flank pain, frequency, hematuria and urgency.  Musculoskeletal: Positive for joint pain and myalgias. Negative for back pain, falls and neck pain.  Skin: Negative.  Negative for itching and rash.  Neurological: Positive for dizziness, weakness and headaches.  Endo/Heme/Allergies: Negative.   Psychiatric/Behavioral: Negative.  Negative for depression, hallucinations, memory loss, substance abuse and  suicidal ideas. The patient is not nervous/anxious and does not have insomnia.     Physical Exam  Constitutional: She is oriented to person, place, and time. She appears well-developed and well-nourished.  HENT:  Head: Normocephalic and atraumatic.  Eyes: Conjunctivae are normal. Pupils are equal, round, and reactive to light.  Neck: Normal range of motion. Neck supple.  Cardiovascular: Normal rate.  Respiratory: Effort normal and breath sounds normal.  GI: Soft.  Musculoskeletal: She exhibits edema and tenderness.  Neurological: She is alert and oriented to person, place, and time.  Skin: Skin is dry.  Psychiatric: She has a normal mood and affect. Her behavior is normal. Judgment and thought content normal.    Encounter Medications:   Outpatient Encounter Prescriptions as of 08/22/2017  Medication Sig Note  . aspirin 81 MG EC tablet Take 81 mg by mouth every morning.    . BD PEN NEEDLE NANO U/F 32G X 4 MM MISC USE AS DIRECTED TWICE DAILY.   . budesonide-formoterol (SYMBICORT) 160-4.5 MCG/ACT inhaler Inhale 2 puffs into the lungs 2 (two) times daily.   . calcitRIOL (ROCALTROL) 0.25 MCG capsule Take 0.25 mcg by mouth daily. Reported on 03/30/2016   . clopidogrel (PLAVIX) 75 MG tablet TAKE 1 TABLET ONCE DAILY.   Marland Kitchen Continuous Blood Gluc Sensor (FREESTYLE LIBRE SENSOR SYSTEM) MISC Use one sensor every 10 days.   Marland Kitchen ezetimibe (ZETIA) 10 MG tablet Take 10 mg by mouth daily.   . famotidine (PEPCID) 20 MG tablet Take 1 tablet (20 mg total) by mouth daily.   . Fluticasone-Salmeterol (ADVAIR DISKUS) 250-50 MCG/DOSE AEPB Inhale 1 puff into the lungs 2 (two) times daily.   Marland Kitchen glucose blood (ACCU-CHEK GUIDE) test strip Use as instructed   . Insulin Aspart Prot & Aspart (NOVOLOG MIX 70/30  FLEXPEN [place]) Inject 40-60 Units into the skin 2 (two) times daily with a meal.   . Insulin Pen Needle (B-D ULTRAFINE III SHORT PEN) 31G X 8 MM MISC 1 each by Does not apply route as directed.   . isosorbide  mononitrate (IMDUR) 60 MG 24 hr tablet TAKE 1 TABLET ONCE DAILY.   Marland Kitchen Lancets (ACCU-CHEK SOFT TOUCH) lancets Use as instructed bid. E11.65.   Marland Kitchen lisinopril (PRINIVIL,ZESTRIL) 20 MG tablet Take 1 tablet (20 mg total) by mouth daily.   . meclizine (ANTIVERT) 25 MG tablet Take 25 mg by mouth 4 (four) times daily as needed for dizziness.   . nitroGLYCERIN (NITROSTAT) 0.4 MG SL tablet Place 1 tablet (0.4 mg total) under the tongue every 5 (five) minutes as needed for chest pain. 04/30/2016: Has on hand if needed.   Marland Kitchen NOVOLOG MIX 70/30 FLEXPEN (70-30) 100 UNIT/ML FlexPen INJECT 50 UNITS IN THE MONRING AND 40 UNITS IN THE EVENING.   Marland Kitchen polyethylene glycol (MIRALAX / GLYCOLAX) packet Take 17 g by mouth daily as needed for mild constipation. 04/30/2016: Takes if needed  . ranolazine (RANEXA) 1000 MG SR tablet Take 1 tablet (1,000 mg total) by mouth 2 (two) times daily.   . rosuvastatin (CRESTOR) 20 MG tablet Take 1 tablet (20 mg total) by mouth every evening.   . sennosides-docusate sodium (SENOKOT-S) 8.6-50 MG tablet Take 1 tablet by mouth 2 (two) times daily before a meal.   . torsemide (DEMADEX) 20 MG tablet Take 20 mg by mouth daily. MAY TAKE EXTRA 20MG DAILY FOR SWELLING.    No facility-administered encounter medications on file as of 08/22/2017.     Functional Status:   In your present state of health, do you have any difficulty performing the following activities: 06/14/2017 05/31/2017  Hearing? N N  Vision? N N  Difficulty concentrating or making decisions? N N  Walking or climbing stairs? Y Y  Dressing or bathing? N N  Doing errands, shopping? [name]  Preparing Food and eating ? N N  Using the Toilet? N N  In the past six months, have you accidently leaked urine? N N  Do you have problems with loss of bowel control? N N  Managing your Medications? N N  Managing your Finances? N N  Housekeeping or managing your Housekeeping? - Y  Some recent data might be hidden    Fall/Depression Screening:     Fall Risk  06/20/2017 05/31/2017 05/31/2017  Falls in the past year? No - No  Number falls in past yr: - - -  Injury with Fall? - - -  Risk Factor Category  - - -  Risk for fall due to : - (No Data) Impaired mobility;Impaired balance/gait;Medication side effect;Other (Comment)  Risk for fall due to: Comment - on antivert history of dizziness on   Follow up - - -   PHQ 2/9 Scores 06/20/2017 06/17/2017 05/31/2017 01/31/2017 09/25/2016 05/11/2016 04/13/2016  PHQ - 2 Score 0 0 0 0 0 0 0    Assessment:   [facility] CM met with Krystal [name] at her apartment Krystal [name] to see [name] on 08/19/17 [name] noted increased fluids on 08/19/17.  [facility] CM reminded Krystal Mcauliff after review of Dr Jacinta Shoe notes in August 2018 that she was informed by him that she can take and extra Lasix for swelling of her feet .  [name] wants Krystal [name] to have an xray for her sinus congestion and headaches noted. Krystal [name]  notes her various plants and her apartment [place]. Krystal [name] reports she believes [name] has had her to go in for a CT scan   Krystal [name] has seen her potential new apartment Reports the new apartment is lot smaller than her present one and she is presently not considering moving   Sleep apnea She has not received her new CPAP machine at this time but pending,.  Confirms "I don't have any energy" [facility] CM continues encourage her to use oxygen  Had her flu shot 08/21/17  Bowel elimination [name] changed her miralax to qod Last BM was on Last Thursday, 08/15/17 She is taking the stool softener bid. Drinks hot tea every morning Encouraged more vegetables than meats. Krystal [name] is not cooking lately and relies on her personal care services aide to cook. [facility] CM discussed the process of poor bowel motility if there is not any vegetables or fiber to encourage meats/proteins to pass through the intestine  Left side of he body cause pain and sleeps on her right more her pain level for this  is a "seven" She never got her mattress issues Mr [place] was given [employer] [facility] [place] as [address], [place], [place], [place] [address]   Opens 8:30 AM to 4:30 pm Monday through Friday at [phone] option 2, 3 and you must call between 9 am and 3 pm to speak with a live customer service staff    Hypertension BP remains elevated Reports "dizzy spells all the time" but denies LOC  Diabetes "varies and running in the "two hundreds"  Krystal Kaney states her last A1c was 10 and reports Dr Dorris Fetch wants her A1c to be 7 She confirms she has not felt like writing down her cbg readings from her glucometer   Discussed Goals and Krystal Cutshaw states she wants to walk more and to have more energy especially when her new CPAP possibly next week.  Krystal Waites reports that she was informed that she has been told that "if I don't wear it at night the machine will notify Dr Luan [name]"  Plan:  [facility] CM will follow up with Krystal [name] in 4 weeks    Joelene Millin L. Lavina Hamman, RN, BSN, [place] [facility] [phone]

## 2017-08-28 DIAGNOSIS — Z79899 Other long term (current) drug therapy: Secondary | ICD-10-CM | POA: Diagnosis not present

## 2017-08-28 DIAGNOSIS — R809 Proteinuria, unspecified: Secondary | ICD-10-CM | POA: Diagnosis not present

## 2017-08-28 DIAGNOSIS — G4733 Obstructive sleep apnea (adult) (pediatric): Secondary | ICD-10-CM | POA: Diagnosis not present

## 2017-08-28 DIAGNOSIS — E785 Hyperlipidemia, unspecified: Secondary | ICD-10-CM | POA: Diagnosis not present

## 2017-08-28 DIAGNOSIS — N183 Chronic kidney disease, stage 3 (moderate): Secondary | ICD-10-CM | POA: Diagnosis not present

## 2017-08-28 DIAGNOSIS — I10 Essential (primary) hypertension: Secondary | ICD-10-CM | POA: Diagnosis not present

## 2017-08-28 DIAGNOSIS — J449 Chronic obstructive pulmonary disease, unspecified: Secondary | ICD-10-CM | POA: Diagnosis not present

## 2017-08-28 DIAGNOSIS — E559 Vitamin D deficiency, unspecified: Secondary | ICD-10-CM | POA: Diagnosis not present

## 2017-09-04 DIAGNOSIS — N184 Chronic kidney disease, stage 4 (severe): Secondary | ICD-10-CM | POA: Diagnosis not present

## 2017-09-04 DIAGNOSIS — N2581 Secondary hyperparathyroidism of renal origin: Secondary | ICD-10-CM | POA: Diagnosis not present

## 2017-09-04 DIAGNOSIS — R809 Proteinuria, unspecified: Secondary | ICD-10-CM | POA: Diagnosis not present

## 2017-09-04 DIAGNOSIS — E559 Vitamin D deficiency, unspecified: Secondary | ICD-10-CM | POA: Diagnosis not present

## 2017-09-05 ENCOUNTER — Other Ambulatory Visit: Payer: Self-pay | Admitting: *Deleted

## 2017-09-05 NOTE — Patient Outreach (Signed)
[facility]) Care Management   09/05/2017  Krystal [name] February 02, 1936 [id number]  Krystal [name] is an 81 y.o. female with diabetes mellitus, nephropathy, peripheral neuropathy,dyslipidemia, hypertension, and PVD. Krystal [name] was originally referred to [facility] for transition of care services after a hospital admission in December2017/January2056fr hypertensive crisis. Most recently [name] has been assisting Krystal [name] with medication management and assistance and diabetes management and education.  Krystal [name] not been hospitalized nor to ED in the last 6 months  Subjective:  See notes below  Objective:   BP 140/80   Pulse 87   Temp (!) 97.5 F (36.4 C) (Oral)   Resp 20   Ht 1.702 m ('5\' 7"'$ )   Wt 194 lb (88 kg)   SpO2 98%   BMI 30.38 kg/m   Review of Systems  Constitutional: Negative for chills, diaphoresis, fever, malaise/fatigue and weight loss.  HENT: Negative.  Negative for congestion, ear discharge, ear pain, hearing loss, nosebleeds, sinus pain, sore throat and tinnitus.   Eyes: Negative.  Negative for blurred vision, photophobia, pain, discharge and redness.  Respiratory: Negative.  Negative for cough, hemoptysis, sputum production, shortness of breath, wheezing and stridor.   Cardiovascular: Positive for leg swelling. Negative for chest pain, palpitations, orthopnea, claudication and PND.  Gastrointestinal: Positive for constipation. Negative for abdominal pain, blood in stool, diarrhea, heartburn, melena, nausea and vomiting.  Genitourinary: Negative.  Negative for dysuria, flank pain, frequency, hematuria and urgency.  Musculoskeletal: Positive for joint pain. Negative for back pain, falls, myalgias and neck pain.  Skin: Negative.  Negative for itching and rash.  Neurological: Positive for dizziness, weakness and headaches. Negative for tingling, tremors, sensory change, speech change, focal weakness, seizures and  loss of consciousness.       Reports continued dizziness and headaches at intervals but Dr HBerdine Addisonis aware  Endo/Heme/Allergies: Negative.  Negative for environmental allergies and polydipsia. Does not bruise/bleed easily.  Psychiatric/Behavioral: Negative.  Negative for depression, hallucinations, memory loss, substance abuse and suicidal ideas. The patient is not nervous/anxious and does not have insomnia.     Physical Exam  Constitutional: She is oriented to person, place, and time. She appears well-developed and well-nourished.  HENT:  Head: Normocephalic and atraumatic.  Eyes: Conjunctivae are normal. Pupils are equal, round, and reactive to light.  Neck: Normal range of motion. Neck supple.  Cardiovascular: Normal rate and regular rhythm.  Respiratory: Effort normal and breath sounds normal.  GI: Soft.  Musculoskeletal: Normal range of motion.  Neurological: She is alert and oriented to person, place, and time.  Skin: Skin is warm and dry.  Psychiatric: She has a normal mood and affect. Her behavior is normal. Judgment and thought content normal.    Encounter Medications:   Outpatient Encounter Prescriptions as of 09/05/2017  Medication Sig Note  . aspirin 81 MG EC tablet Take 81 mg by mouth every morning.    . BD PEN NEEDLE NANO U/F 32G X 4 MM MISC USE AS DIRECTED TWICE DAILY.   . budesonide-formoterol (SYMBICORT) 160-4.5 MCG/ACT inhaler Inhale 2 puffs into the lungs 2 (two) times daily.   . calcitRIOL (ROCALTROL) 0.25 MCG capsule Take 0.25 mcg by mouth daily. Reported on 03/30/2016   . clopidogrel (PLAVIX) 75 MG tablet TAKE 1 TABLET ONCE DAILY.   .Marland KitchenContinuous Blood Gluc Sensor (FREESTYLE LIBRE SENSOR SYSTEM) MISC Use one sensor every 10 days.   .Marland Kitchenezetimibe (ZETIA) 10 MG tablet Take 10 mg by mouth daily.   .Marland Kitchen  famotidine (PEPCID) 20 MG tablet Take 1 tablet (20 mg total) by mouth daily.   . Fluticasone-Salmeterol (ADVAIR DISKUS) 250-50 MCG/DOSE AEPB Inhale 1 puff into the lungs 2  (two) times daily.   Marland Kitchen glucose blood (ACCU-CHEK GUIDE) test strip Use as instructed   . Insulin Aspart Prot & Aspart (NOVOLOG MIX 70/30 FLEXPEN [place]) Inject 40-60 Units into the skin 2 (two) times daily with a meal.   . Insulin Pen Needle (B-D ULTRAFINE III SHORT PEN) 31G X 8 MM MISC 1 each by Does not apply route as directed.   . isosorbide mononitrate (IMDUR) 60 MG 24 hr tablet TAKE 1 TABLET ONCE DAILY.   Marland Kitchen Lancets (ACCU-CHEK SOFT TOUCH) lancets Use as instructed bid. E11.65.   Marland Kitchen lisinopril (PRINIVIL,ZESTRIL) 20 MG tablet Take 1 tablet (20 mg total) by mouth daily.   . meclizine (ANTIVERT) 25 MG tablet Take 25 mg by mouth 4 (four) times daily as needed for dizziness.   . nitroGLYCERIN (NITROSTAT) 0.4 MG SL tablet Place 1 tablet (0.4 mg total) under the tongue every 5 (five) minutes as needed for chest pain. 04/30/2016: Has on hand if needed.   Marland Kitchen NOVOLOG MIX 70/30 FLEXPEN (70-30) 100 UNIT/ML FlexPen INJECT 50 UNITS IN THE MONRING AND 40 UNITS IN THE EVENING.   Marland Kitchen polyethylene glycol (MIRALAX / GLYCOLAX) packet Take 17 g by mouth daily as needed for mild constipation. 04/30/2016: Takes if needed  . ranolazine (RANEXA) 1000 MG SR tablet Take 1 tablet (1,000 mg total) by mouth 2 (two) times daily.   . rosuvastatin (CRESTOR) 20 MG tablet Take 1 tablet (20 mg total) by mouth every evening.   . sennosides-docusate sodium (SENOKOT-S) 8.6-50 MG tablet Take 1 tablet by mouth 2 (two) times daily before a meal.   . torsemide (DEMADEX) 20 MG tablet Take 20 mg by mouth daily. MAY TAKE EXTRA 20MG  DAILY FOR SWELLING.    No facility-administered encounter medications on file as of 09/05/2017.     Functional Status:   In your present state of health, do you have any difficulty performing the following activities: 06/14/2017 05/31/2017  Hearing? N N  Vision? N N  Difficulty concentrating or making decisions? N N  Walking or climbing stairs? Y Y  Dressing or bathing? N N  Doing errands, shopping? 06/02/2017  Preparing  Food and eating ? N N  Using the Toilet? N N  In the past six months, have you accidently leaked urine? N N  Do you have problems with loss of bowel control? N N  Managing your Medications? N N  Managing your Finances? N N  Housekeeping or managing your Housekeeping? - Y  Some recent data might be hidden    Fall/Depression Screening:    Fall Risk  06/20/2017 05/31/2017 05/31/2017  Falls in the past year? No - No  Number falls in past yr: - - -  Injury with Fall? - - -  Risk Factor Category  - - -  Risk for fall due to : - (No Data) Impaired mobility;Impaired balance/gait;Medication side effect;Other (Comment)  Risk for fall due to: Comment - on antivert history of dizziness on   Follow up - - -   PHQ 2/9 Scores 06/20/2017 06/17/2017 05/31/2017 01/31/2017 09/25/2016 05/11/2016 04/13/2016  PHQ - 2 Score 0 0 0 0 0 0 0    Assessment:   CM visited with Krystal [name] in her apartment.  She is sitting in her living room watching tv with her oxygen intact  Moving Krystal [name] confirms she is not moving to another apartment in her complex. Reports she changed her mind because "it is too much for me.  I am just going to have the guy assist me in changing around in here."  [employer] Krystal [name] given the customer service number for [employer] per her request   Hearing  [facility] CM has noted for the second visit in October 2018 that Krystal [name] has her tv on in her bedroom and living room.  The one in the living room is loud  Weight Last wt was 196 lbs on her scales at MD wt 194 lbs ht 5'7" Discussed BMI showed BMI now  Discussed BMI calculator indicates her wt needs to be 159 lbs to have a normal BMI  (less than 25) "I can't remember being that weight in high school"  Walking [facility] CM discussed home health therapy or cardiac or pulmonary rehabilitation.  Krystal [name] has already had home health PT.  [facility] CM answered questions about cardiac rehab  [facility] CM reviewed the benefits, what happens and referral process for  pulmonary rehab Provided [facility]) pulmonary rehab services number as [phone] and encouraged her to discuss this with Dr Loleta Chance.  Diabetes "it goes up and down and if I don't eat anything Her cbg during home visit was 349 and she reports she had drank a coca cola and "didn't take a shot when I ate this morning"  She took her insulin shot after getting this cbg reading Last HgA1c = 9.5 on 06/17/17 It was 10.4 on 01/24/17 scheduled to see Dr Fransico Him on 09/24/17 1400  Pain up under left breast left hip level 9 over a month and Dr Loleta Chance is aware per patient   Plan: To follow up with Krystal [name] in 2-4 weeks   [facility] CM Care Plan Problem One     Most Recent Value  Care Plan Problem One  Knowledge deficit related to diabetes as evidenced by patient questions around diabetes  Role Documenting the Problem One  Care Management Coordinator  Care Plan for Problem One  Active  [facility] Long Term Goal   Patient will verbalize understanding of diabetes management over the next 60 days  [facility] Long Term Goal Start Date  06/14/17  Interventions for Problem One Long Term Goal  Re reviewed how to check cbg and how to give insulin,  discussed current plan of care,  reviewed upcoming appointment with CDE/RD,  reviewed MD recommendations  [facility] CM Short Term Goal #1   Over the next 30 days, patient will verbalize understanding of which medications are used to treat her diabetes  [facility] CM Short Term Goal #1 Start Date  01/24/17  [facility] CM Short Term Goal #1 Met Date  02/28/17  Interventions for Short Term Goal #1  Medication review planned for next week's home visit  [facility] CM Short Term Goal #2   Over the next 30 days, patient will check cbg's daily and record as prescribed  [facility] CM Short Term Goal #2 Start Date  01/24/17  [facility] CM Short Term Goal #2 Met Date  02/28/17  Interventions for Short Term Goal #2  Reviewed prescribed plan to check cbg's daily,  plan review on next week's home visit  [facility] CM Short Term Goal #3   over the next 30 days, patient will verbalize understanding of basic components of prescribed carb modified diet  [facility] CM Short Term Goal #3 Start Date  01/24/17  [facility]  CM Short Term Goal #3 Met Date  02/28/17  Interventions for Short Tern Goal #3  discussed carb modified diet,  prescribed Emmi educaitonal materials,  planned detailed review for next week's home visit      Youssef Footman L. Lavina Hamman, RN, BSN, [place] [facility] Management [phone]

## 2017-09-10 ENCOUNTER — Other Ambulatory Visit (HOSPITAL_COMMUNITY): Payer: Self-pay | Admitting: Family Medicine

## 2017-09-10 ENCOUNTER — Ambulatory Visit (HOSPITAL_COMMUNITY)
Admission: RE | Admit: 2017-09-10 | Discharge: 2017-09-10 | Disposition: A | Discharge status: Home / Self Care | Payer: Medicare Other | Source: Ambulatory Visit | Attending: Family Medicine | Admitting: Family Medicine

## 2017-09-10 DIAGNOSIS — J3489 Other specified disorders of nose and nasal sinuses: Secondary | ICD-10-CM

## 2017-09-10 DIAGNOSIS — R51 Headache: Secondary | ICD-10-CM | POA: Diagnosis not present

## 2017-09-10 DIAGNOSIS — R42 Dizziness and giddiness: Secondary | ICD-10-CM | POA: Diagnosis not present

## 2017-09-17 DIAGNOSIS — E1122 Type 2 diabetes mellitus with diabetic chronic kidney disease: Secondary | ICD-10-CM | POA: Diagnosis not present

## 2017-09-17 DIAGNOSIS — N184 Chronic kidney disease, stage 4 (severe): Secondary | ICD-10-CM | POA: Diagnosis not present

## 2017-09-17 DIAGNOSIS — Z794 Long term (current) use of insulin: Secondary | ICD-10-CM | POA: Diagnosis not present

## 2017-09-18 LAB — HEMOGLOBIN A1C
EAG (MMOL/L): 13.9 (calc)
Hgb A1c MFr Bld: 10.4 % of total Hgb — ABNORMAL HIGH (ref <5.7)
MEAN PLASMA GLUCOSE: 252 (calc)

## 2017-09-18 LAB — RENAL FUNCTION PANEL
ALBUMIN MSPROF: 3.7 g/dL (ref 3.6–5.1)
BUN / CREAT RATIO: 12 (calc) (ref 6–22)
BUN: 28 mg/dL — ABNORMAL HIGH (ref 7–25)
CHLORIDE: 101 mmol/L (ref 98–110)
CO2: 32 mmol/L (ref 20–32)
CREATININE: 2.35 mg/dL — AB (ref 0.60–0.93)
Calcium: 9.3 mg/dL (ref 8.6–10.4)
GLUCOSE: 225 mg/dL — AB (ref 65–99)
PHOSPHORUS: 3.8 mg/dL (ref 2.1–4.3)
Potassium: 4 mmol/L (ref 3.5–5.3)
Sodium: 141 mmol/L (ref 135–146)

## 2017-09-24 ENCOUNTER — Other Ambulatory Visit: Payer: Self-pay | Admitting: "Endocrinology

## 2017-09-24 ENCOUNTER — Encounter: Payer: Self-pay | Admitting: "Endocrinology

## 2017-09-24 ENCOUNTER — Ambulatory Visit: Payer: Medicare Other | Admitting: "Endocrinology

## 2017-10-03 ENCOUNTER — Other Ambulatory Visit: Payer: Self-pay | Admitting: *Deleted

## 2017-10-03 NOTE — Patient Outreach (Addendum)
[facility]) Care Management   10/03/2017  NEOMIA HERBEL [birth year] [id number]  SHAKEYA KERKMAN is an 81 y.o. female with diabetes mellitus, nephropathy, peripheral neuropathy,dyslipidemia, hypertension, and PVD. Mrs. Brewton was originally referred to [facility] for transition of care services after a hospital admission in December2017/January2060fr hypertensive crisis. Most recently [facility] has been assisting Mrs. Coscia with medication management and assistance and diabetes management and education.  Mrs MFromerhas not been hospitalized nor to ED in the last 6 months   Subjective: see notes below  Objective:   BP 130/78   Pulse 82   Temp 98.2 F (36.8 C) (Oral)   Resp 20   SpO2 91%  Review of Systems  HENT: Positive for ear pain. Negative for nosebleeds.        "Nose running"  Eyes: Negative.   Respiratory: Positive for cough.        Minimal amount of sputum is brown with clear discharge  Cardiovascular: Negative.   Gastrointestinal: Negative for constipation, diarrhea and nausea.  Genitourinary: Positive for frequency.       Decreased in frequency and urine noted to be darker at intervals even with intake of fluids  Musculoskeletal: Positive for back pain. Negative for neck pain.       Back pain both sides at hip level  Skin: Negative.   Neurological: Positive for dizziness, tremors, weakness and headaches.       Reports tremors so bad that she has questioned if her blood sugar is changed  Endo/Heme/Allergies: Negative.   Psychiatric/Behavioral: Negative.  Negative for depression, hallucinations, memory loss, substance abuse and suicidal ideas. The patient is not nervous/anxious and does not have insomnia.     Physical Exam  Constitutional: She is oriented to person, place, and time. She appears well-developed and well-nourished.  HENT:  Head: Normocephalic and atraumatic.  Eyes: Conjunctivae are normal. Pupils are  equal, round, and reactive to light.  Neck: Normal range of motion. Neck supple.  Cardiovascular: Normal rate and regular rhythm.  Respiratory: Effort normal and breath sounds normal.  GI: Soft. Bowel sounds are normal.  Musculoskeletal: Normal range of motion.  Neurological: She is alert and oriented to person, place, and time.  Skin: Skin is warm and dry.  Psychiatric: She has a normal mood and affect. Her behavior is normal. Judgment and thought content normal.    Encounter Medications:   Outpatient Encounter Medications as of 10/03/2017  Medication Sig Note  . aspirin 81 MG EC tablet Take 81 mg by mouth every morning.    . BD PEN NEEDLE NANO U/F 32G X 4 MM MISC USE AS DIRECTED TWICE DAILY.   . budesonide-formoterol (SYMBICORT) 160-4.5 MCG/ACT inhaler Inhale 2 puffs into the lungs 2 (two) times daily.   . calcitRIOL (ROCALTROL) 0.25 MCG capsule Take 0.25 mcg by mouth daily. Reported on 03/30/2016   . clopidogrel (PLAVIX) 75 MG tablet TAKE 1 TABLET ONCE DAILY.   .Marland KitchenContinuous Blood Gluc Sensor (FREESTYLE LIBRE SENSOR SYSTEM) MISC Use one sensor every 10 days.   .Marland Kitchenezetimibe (ZETIA) 10 MG tablet Take 10 mg by mouth daily.   . famotidine (PEPCID) 20 MG tablet Take 1 tablet (20 mg total) by mouth daily.   . Fluticasone-Salmeterol (ADVAIR DISKUS) 250-50 MCG/DOSE AEPB Inhale 1 puff into the lungs 2 (two) times daily.   .Marland Kitchenglucose blood (ACCU-CHEK GUIDE) test strip Use as instructed   . Insulin Aspart Prot & Aspart (NOVOLOG MIX 70/30 FLEXPEN [place]) Inject  40-60 Units into the skin 2 (two) times daily with a meal.   . Insulin Pen Needle (B-D ULTRAFINE III SHORT PEN) 31G X 8 MM MISC 1 each by Does not apply route as directed.   . isosorbide mononitrate (IMDUR) 60 MG 24 hr tablet TAKE 1 TABLET ONCE DAILY.   Marland Kitchen Lancets (ACCU-CHEK SOFT TOUCH) lancets Use as instructed bid. E11.65.   Marland Kitchen lisinopril (PRINIVIL,ZESTRIL) 20 MG tablet Take 1 tablet (20 mg total) by mouth daily.   . meclizine (ANTIVERT) 25 MG  tablet Take 25 mg by mouth 4 (four) times daily as needed for dizziness.   . nitroGLYCERIN (NITROSTAT) 0.4 MG SL tablet Place 1 tablet (0.4 mg total) under the tongue every 5 (five) minutes as needed for chest pain. 04/30/2016: Has on hand if needed.   Marland Kitchen NOVOLOG MIX 70/30 FLEXPEN (70-30) 100 UNIT/ML FlexPen INJECT 50 UNITS IN THE MONRING AND 40 UNITS IN THE EVENING.   Marland Kitchen polyethylene glycol (MIRALAX / GLYCOLAX) packet Take 17 g by mouth daily as needed for mild constipation. 04/30/2016: Takes if needed  . ranolazine (RANEXA) 1000 MG SR tablet Take 1 tablet (1,000 mg total) by mouth 2 (two) times daily.   . rosuvastatin (CRESTOR) 20 MG tablet Take 1 tablet (20 mg total) by mouth every evening.   . sennosides-docusate sodium (SENOKOT-S) 8.6-50 MG tablet Take 1 tablet by mouth 2 (two) times daily before a meal.   . torsemide (DEMADEX) 20 MG tablet Take 20 mg by mouth daily. MAY TAKE EXTRA 20MG DAILY FOR SWELLING.    No facility-administered encounter medications on file as of 10/03/2017.     Functional Status:   In your present state of health, do you have any difficulty performing the following activities: 06/14/2017 05/31/2017  Hearing? N N  Vision? N N  Difficulty concentrating or making decisions? N N  Walking or climbing stairs? Y Y  Dressing or bathing? N N  Doing errands, shopping? [name]  Preparing Food and eating ? N N  Using the Toilet? N N  In the past six months, have you accidently leaked urine? N N  Do you have problems with loss of bowel control? N N  Managing your Medications? N N  Managing your Finances? N N  Housekeeping or managing your Housekeeping? - Y  Some recent data might be hidden    Fall/Depression Screening:    Fall Risk  06/20/2017 05/31/2017 05/31/2017  Falls in the past year? No - No  Number falls in past yr: - - -  Injury with Fall? - - -  Risk Factor Category  - - -  Risk for fall due to : - (No Data) Impaired mobility;Impaired balance/gait;Medication side  effect;Other (Comment)  Risk for fall due to: Comment - on antivert history of dizziness on   Follow up - - -   PHQ 2/9 Scores 06/20/2017 06/17/2017 05/31/2017 01/31/2017 09/25/2016 05/11/2016 04/13/2016  PHQ - 2 Score 0 0 0 0 0 0 0    Assessment:   Cm met Mr Stanard at her apartment She is not wearing her oxygen today Reports she "occasionally I will try to not wear it" Cm encouraged her to put it on an discussed the use of oxygen for COPD patients Her noted Oxygen saturation today was 91%  Complaints voiced includes a running nose, tremors, dizziness at interval, headache, back ache on both sides of her hips Upon examination no tremors, bruising noted and her BP was WNL.  She then reports  these symptoms may be related to stress.  CM encouraged stress reduction interventions and allowed her to ventilate her feelings   Discussed Genitourinary issues Frequency, bilateral hip pain and back pain She reports her "My Kidney doctor told me by age 5 I will be on dialysis" Reports a hx of her uncle with dialysis   Epic indicates her pcp [name] ordered on 08/19/17 for her to have an xray of her paranasal sinuses for c/o dizziness, head pain for 6 months. The result of the xray was negative and CM discussed this with Mrs Rada.  She voiced being pleased  Her CPAP has arrived and sleeping much better   Gets blister pack via [place] without issues    Reports stress related to unresolved issues related to her mattress that needs replacing and concerns with missing items in her home during her packing process. Cm allowed her to ventilate her feeling and referred her back to the company she purchased her mattress from and to the person she thinks may know about her missing items.  CM discussed possible solution with her mattress as an economical foam padding but she is undecided on her course of action   Plan:  Cm will follow up with Mrs Mayr in 2-4 weeks for home visit CM noting the neurological  and urinary symptoms reported by Mrs Dwyer, called Nephrology office during the home visit to assist Mrs Belding in reporting her s/s but Mrs Dagostino referred to pcp by Thayer Headings for "possible urology issues"- Mrs Tye encouraged to contact [name] office to review this as recommended    Joelene Millin L. Lavina Hamman, RN, BSN, [facility] [phone]

## 2017-10-15 ENCOUNTER — Other Ambulatory Visit: Payer: Self-pay | Admitting: Cardiovascular Disease

## 2017-10-17 ENCOUNTER — Ambulatory Visit: Payer: Medicare Other | Admitting: "Endocrinology

## 2017-10-17 DIAGNOSIS — E1151 Type 2 diabetes mellitus with diabetic peripheral angiopathy without gangrene: Secondary | ICD-10-CM | POA: Diagnosis not present

## 2017-10-17 DIAGNOSIS — L603 Nail dystrophy: Secondary | ICD-10-CM | POA: Diagnosis not present

## 2017-10-17 DIAGNOSIS — I739 Peripheral vascular disease, unspecified: Secondary | ICD-10-CM | POA: Diagnosis not present

## 2017-10-18 ENCOUNTER — Other Ambulatory Visit: Payer: Self-pay | Admitting: Cardiovascular Disease

## 2017-10-20 ENCOUNTER — Other Ambulatory Visit: Payer: Self-pay | Admitting: *Deleted

## 2017-10-20 NOTE — Patient Outreach (Signed)
[facility]) Care Management  10/20/2017  Krystal [name] [birth year] [id number]   Care coordination  On 10/18/17 CM called to follow up with [name] She is doing the same as she as on the last home visit. She reports that she began to feel better but at intervals the symptoms returned and resolved.   She was encouraged on last home visit to contact pcp office and she has not done so She continues to use her bipap  Cm had attempted to call [name] twice on 10/18/17 before contact was made at her home briefly She reports not eating, was encouraged to eat a meal and was eating when CM left her home [facility] 2019 calendar provided  Plans Follow up with [name] in 2- 3 weeks for home visit- She had to be reminded of her next home visit date   [place]. Lavina Hamman, RN, BSN, [facility] [phone]

## 2017-10-29 ENCOUNTER — Other Ambulatory Visit: Payer: Self-pay | Admitting: Cardiovascular Disease

## 2017-10-31 ENCOUNTER — Other Ambulatory Visit: Payer: Self-pay | Admitting: *Deleted

## 2017-10-31 ENCOUNTER — Other Ambulatory Visit: Payer: Self-pay

## 2017-10-31 NOTE — Patient Outreach (Signed)
[facility]) Care Management   10/31/2017  Krystal [name] 1936-01-19 [id number]  Krystal [name] is an 81 y.o. female  with diabetes mellitus, nephropathy, peripheral neuropathy,dyslipidemia, hypertension, and PVD. Krystal [name] was originally referred to [facility] for transition of care services after a hospital admission in December 2017/January 2018 for hypertensive crisis. Most recently [facility] has been assisting Krystal [name] with medication management and assistance and diabetes management and education.  Krystal [name] has not been hospitalized nor to ED in the last 6 months   Subjective: see notes below  Objective:   BP 132/78   Pulse 71   Temp 97.9 F (36.6 C) (Oral)   Resp 20   SpO2 96%  Review of Systems  Constitutional: Positive for malaise/fatigue. Negative for chills, diaphoresis, fever and weight loss.  HENT: Positive for sinus pain.   Eyes: Negative.   Respiratory: Negative.   Cardiovascular: Negative.   Gastrointestinal: Positive for constipation. Negative for abdominal pain, blood in stool, diarrhea, heartburn, melena, nausea and vomiting.  Genitourinary: Positive for frequency. Negative for dysuria, hematuria and urgency.  Musculoskeletal: Positive for back pain and joint pain. Negative for falls, myalgias and neck pain.  Skin: Negative.   Neurological: Positive for dizziness and weakness. Negative for tingling, tremors, sensory change, speech change, focal weakness, seizures, loss of consciousness and headaches.  Endo/Heme/Allergies: Negative.  Negative for environmental allergies. Does not bruise/bleed easily.  Psychiatric/Behavioral: Negative for depression, hallucinations, memory loss, substance abuse and suicidal ideas. The patient is nervous/anxious. The patient does not have insomnia.     Physical Exam  Constitutional: She appears well-developed and well-nourished.  HENT:  Head: Normocephalic and atraumatic.   Eyes: Conjunctivae are normal. Pupils are equal, round, and reactive to light.  Neck: Normal range of motion. Neck supple.  Cardiovascular: Normal rate.  Respiratory: Effort normal and breath sounds normal.  GI: Soft. Bowel sounds are normal.  Musculoskeletal: She exhibits edema and tenderness.  Neurological: She is alert.  Skin: Skin is warm and dry.  Psychiatric: She has a normal mood and affect. Her behavior is normal. Judgment and thought content normal.    Encounter Medications:   Outpatient Encounter Medications as of 10/31/2017  Medication Sig Note  . aspirin 81 MG EC tablet Take 81 mg by mouth every morning.    . BD PEN NEEDLE NANO U/F 32G X 4 MM MISC USE AS DIRECTED TWICE DAILY.   . budesonide-formoterol (SYMBICORT) 160-4.5 MCG/ACT inhaler Inhale 2 puffs into the lungs 2 (two) times daily.   . calcitRIOL (ROCALTROL) 0.25 MCG capsule Take 0.25 mcg by mouth daily. Reported on 03/30/2016   . clopidogrel (PLAVIX) 75 MG tablet TAKE (1) TABLET BY MOUTH ONCE DAILY.   Marland Kitchen Continuous Blood Gluc Sensor (FREESTYLE LIBRE SENSOR SYSTEM) MISC Use one sensor every 10 days.   Marland Kitchen ezetimibe (ZETIA) 10 MG tablet TAKE 1 TABLET BY MOUTH EVERY MORNING.   . famotidine (PEPCID) 20 MG tablet Take 1 tablet (20 mg total) by mouth daily.   . Fluticasone-Salmeterol (ADVAIR DISKUS) 250-50 MCG/DOSE AEPB Inhale 1 puff into the lungs 2 (two) times daily.   Marland Kitchen glucose blood (ACCU-CHEK GUIDE) test strip Use as instructed   . Insulin Aspart Prot & Aspart (NOVOLOG MIX 70/30 FLEXPEN [employer]) Inject 40-60 Units into the skin 2 (two) times daily with a meal.   . Insulin Pen Needle (B-D ULTRAFINE III SHORT PEN) 31G X 8 MM MISC 1 each by Does not apply route as directed.   Marland Kitchen  isosorbide mononitrate (IMDUR) 60 MG 24 hr tablet TAKE 1 TABLET ONCE DAILY.   . isosorbide mononitrate (IMDUR) 60 MG 24 hr tablet TAKE (1) TABLET BY MOUTH ONCE DAILY.   Marland Kitchen Lancets (ACCU-CHEK SOFT TOUCH) lancets Use as instructed bid. E11.65.   Marland Kitchen lisinopril  (PRINIVIL,ZESTRIL) 20 MG tablet TAKE (1) TABLET BY MOUTH ONCE DAILY.   . meclizine (ANTIVERT) 25 MG tablet Take 25 mg by mouth 4 (four) times daily as needed for dizziness.   . nitroGLYCERIN (NITROSTAT) 0.4 MG SL tablet Place 1 tablet (0.4 mg total) under the tongue every 5 (five) minutes as needed for chest pain. 04/30/2016: Has on hand if needed.   Marland Kitchen NOVOLOG MIX 70/30 FLEXPEN (70-30) 100 UNIT/ML FlexPen INJECT 50 UNITS IN THE MONRING AND 40 UNITS IN THE EVENING.   Marland Kitchen polyethylene glycol (MIRALAX / GLYCOLAX) packet Take 17 g by mouth daily as needed for mild constipation. 04/30/2016: Takes if needed  . RANEXA 500 MG 12 hr tablet TAKE (2) TABLETS BY MOUTH TWICE DAILY.   . ranolazine (RANEXA) 1000 MG SR tablet Take 1 tablet (1,000 mg total) by mouth 2 (two) times daily.   . rosuvastatin (CRESTOR) 20 MG tablet Take 1 tablet (20 mg total) by mouth every evening.   . sennosides-docusate sodium (SENOKOT-S) 8.6-50 MG tablet Take 1 tablet by mouth 2 (two) times daily before a meal.   . torsemide (DEMADEX) 20 MG tablet TAKE 1 TABLET BY MOUTH ONCE DAILY FOR SWELLING, MAY TAKE 1 EXTRA DAILY IF SWELLING WORSENS.    No facility-administered encounter medications on file as of 10/31/2017.     Functional Status:   In your present state of health, do you have any difficulty performing the following activities: 06/14/2017 05/31/2017  Hearing? N N  Vision? N N  Difficulty concentrating or making decisions? N N  Walking or climbing stairs? Y Y  Dressing or bathing? N N  Doing errands, shopping? Krystal [name]  Preparing Food and eating ? N N  Using the Toilet? N N  In the past six months, have you accidently leaked urine? N N  Do you have problems with loss of bowel control? N N  Managing your Medications? N N  Managing your Finances? N N  Housekeeping or managing your Housekeeping? - Y  Some recent data might be hidden    Fall/Depression Screening:    Fall Risk  06/20/2017 05/31/2017 05/31/2017  Falls in the past year?  No - No  Number falls in past yr: - - -  Injury with Fall? - - -  Risk Factor Category  - - -  Risk for fall due to : - (No Data) Impaired mobility;Impaired balance/gait;Medication side effect;Other (Comment)  Risk for fall due to: Comment - on antivert history of dizziness on   Follow up - - -   PHQ 2/9 Scores 06/20/2017 06/17/2017 05/31/2017 01/31/2017 09/25/2016 05/11/2016 04/13/2016  PHQ - 2 Score 0 0 0 0 0 0 0    Assessment:     Present medical concern Teeth sore not been able to eat well for "a week" has not the dentist  Sinus Had flu shot Headache taking tylenol and tramadol   Personal care aide  Still visit daily  weight down to 187   Hip pain level 8   Fatigue  Gt tired when unpacking boxes  Reports she is having stress related to missing home items- Allowed her to ventilate her feelings and then encouraged her to find another outlet to relieve  stress  Now to be more alert with use of CPAP  Missed endocrinology appointment   Urinary frequency back pain but has not called into pcp or nephrologist Encouraged her to contact [name] or Dr Hinda Lenis  Plan:  Follow up with Krystal [name] in 2-4 weeks  Joelene Millin L. Lavina Hamman, RN, BSN, [facility] [phone]

## 2017-11-05 ENCOUNTER — Other Ambulatory Visit: Payer: Self-pay | Admitting: Cardiovascular Disease

## 2017-11-07 ENCOUNTER — Other Ambulatory Visit: Payer: Self-pay | Admitting: Cardiovascular Disease

## 2017-11-20 DIAGNOSIS — N183 Chronic kidney disease, stage 3 (moderate): Secondary | ICD-10-CM | POA: Diagnosis not present

## 2017-11-20 DIAGNOSIS — I10 Essential (primary) hypertension: Secondary | ICD-10-CM | POA: Diagnosis not present

## 2017-11-20 DIAGNOSIS — R809 Proteinuria, unspecified: Secondary | ICD-10-CM | POA: Diagnosis not present

## 2017-11-20 DIAGNOSIS — A809 Acute poliomyelitis, unspecified: Secondary | ICD-10-CM | POA: Diagnosis not present

## 2017-11-20 DIAGNOSIS — E559 Vitamin D deficiency, unspecified: Secondary | ICD-10-CM | POA: Diagnosis not present

## 2017-11-20 DIAGNOSIS — Z79899 Other long term (current) drug therapy: Secondary | ICD-10-CM | POA: Diagnosis not present

## 2017-11-27 DIAGNOSIS — R809 Proteinuria, unspecified: Secondary | ICD-10-CM | POA: Diagnosis not present

## 2017-11-27 DIAGNOSIS — E559 Vitamin D deficiency, unspecified: Secondary | ICD-10-CM | POA: Diagnosis not present

## 2017-11-27 DIAGNOSIS — N2581 Secondary hyperparathyroidism of renal origin: Secondary | ICD-10-CM | POA: Diagnosis not present

## 2017-11-27 DIAGNOSIS — N184 Chronic kidney disease, stage 4 (severe): Secondary | ICD-10-CM | POA: Diagnosis not present

## 2017-11-28 ENCOUNTER — Other Ambulatory Visit: Payer: Self-pay

## 2017-11-28 ENCOUNTER — Other Ambulatory Visit: Payer: Self-pay | Admitting: *Deleted

## 2017-11-28 NOTE — Patient Outreach (Signed)
[facility]) Care Management   11/28/2017  Krystal [name] 07-05-1936 [id number]  Krystal [name] is an 82 y.o. female  with diabetes mellitus, nephropathy, peripheral neuropathy,dyslipidemia, hypertension, and PVD. Krystal [name] was originally referred to [facility] for transition of care services after a hospital admission in December2017/January2071fr hypertensive crisis. [place] [facility] [place] assisting Krystal. [name] with medication management and assistance and diabetes management and education.  Krystal [name] not been hospitalized nor to ED in the last 6 months CM following her for complex community cm services    Subjective:  See notes below  Objective:   BP 138/78   Pulse 86   Temp (!) 97.5 F (36.4 C) (Oral)   Resp 20   Wt 188 lb (85.3 kg)   SpO2 96%   BMI 29.44 kg/m   Review of Systems  Constitutional: Negative for chills, diaphoresis, fever, malaise/fatigue and weight loss.  HENT: Positive for sinus pain and tinnitus. Negative for congestion, ear discharge, ear pain, hearing loss, nosebleeds and sore throat.   Eyes: Negative.  Negative for blurred vision, double vision, photophobia, pain, discharge and redness.  Respiratory: Negative.  Negative for cough, hemoptysis, sputum production, shortness of breath, wheezing and stridor.   Cardiovascular: Positive for chest pain. Negative for palpitations, orthopnea, claudication, leg swelling and PND.  Gastrointestinal: Positive for constipation. Negative for abdominal pain, blood in stool, diarrhea, heartburn, melena, nausea and vomiting.  Genitourinary: Negative.  Negative for dysuria, flank pain, frequency, hematuria and urgency.  Musculoskeletal: Positive for back pain and joint pain. Negative for falls, myalgias and neck pain.       Back and hip pain PMH 2 back surgeries Has not contacted her MD  Skin: Negative.  Negative for itching and rash.  Neurological: Positive for  dizziness, weakness and headaches. Negative for tingling, tremors, sensory change, speech change, focal weakness, seizures and loss of consciousness.  Endo/Heme/Allergies: Negative.  Negative for environmental allergies and polydipsia. Does not bruise/bleed easily.  Psychiatric/Behavioral: Negative.  Negative for depression, hallucinations, memory loss, substance abuse and suicidal ideas. The patient is not nervous/anxious and does not have insomnia.     Physical Exam  Constitutional: She is oriented to person, place, and time. She appears well-developed and well-nourished.  HENT:  Head: Normocephalic and atraumatic.  Eyes: Conjunctivae are normal. Pupils are equal, round, and reactive to light.  Neck: Normal range of motion. Neck supple.  Cardiovascular: Normal rate and regular rhythm.  Respiratory: Effort normal and breath sounds normal.  GI: Soft.  Musculoskeletal: She exhibits tenderness.  Neurological: She is alert and oriented to person, place, and time.  Skin: Skin is warm and dry.  Psychiatric: She has a normal mood and affect. Her behavior is normal. Judgment and thought content normal.    Encounter Medications:   Outpatient Encounter Medications as of 11/28/2017  Medication Sig Note  . aspirin 81 MG EC tablet Take 81 mg by mouth every morning.    . BD PEN NEEDLE NANO U/F 32G X 4 MM MISC USE AS DIRECTED TWICE DAILY.   . budesonide-formoterol (SYMBICORT) 160-4.5 MCG/ACT inhaler Inhale 2 puffs into the lungs 2 (two) times daily.   . calcitRIOL (ROCALTROL) 0.25 MCG capsule Take 0.25 mcg by mouth daily. Reported on 03/30/2016   . clopidogrel (PLAVIX) 75 MG tablet TAKE (1) TABLET BY MOUTH ONCE DAILY.   .Marland KitchenContinuous Blood Gluc Sensor (FREESTYLE LIBRE SENSOR SYSTEM) MISC Use one sensor every 10 days.   .Marland Kitchenezetimibe (  ZETIA) 10 MG tablet TAKE 1 TABLET BY MOUTH EVERY MORNING.   . famotidine (PEPCID) 20 MG tablet Take 1 tablet (20 mg total) by mouth daily.   . Fluticasone-Salmeterol (ADVAIR  DISKUS) 250-50 MCG/DOSE AEPB Inhale 1 puff into the lungs 2 (two) times daily.   Marland Kitchen glucose blood (ACCU-CHEK GUIDE) test strip Use as instructed   . Insulin Aspart Prot & Aspart (NOVOLOG MIX 70/30 FLEXPEN [place]) Inject 40-60 Units into the skin 2 (two) times daily with a meal.   . Insulin Pen Needle (B-D ULTRAFINE III SHORT PEN) 31G X 8 MM MISC 1 each by Does not apply route as directed.   . isosorbide mononitrate (IMDUR) 60 MG 24 hr tablet TAKE 1 TABLET ONCE DAILY.   . isosorbide mononitrate (IMDUR) 60 MG 24 hr tablet TAKE (1) TABLET BY MOUTH ONCE DAILY.   Marland Kitchen Lancets (ACCU-CHEK SOFT TOUCH) lancets Use as instructed bid. E11.65.   Marland Kitchen lisinopril (PRINIVIL,ZESTRIL) 20 MG tablet TAKE (1) TABLET BY MOUTH ONCE DAILY.   . meclizine (ANTIVERT) 25 MG tablet Take 25 mg by mouth 4 (four) times daily as needed for dizziness.   . nitroGLYCERIN (NITROSTAT) 0.4 MG SL tablet Place 1 tablet (0.4 mg total) under the tongue every 5 (five) minutes as needed for chest pain. 04/30/2016: Has on hand if needed.   Marland Kitchen NOVOLOG MIX 70/30 FLEXPEN (70-30) 100 UNIT/ML FlexPen INJECT 50 UNITS IN THE MONRING AND 40 UNITS IN THE EVENING.   Marland Kitchen polyethylene glycol (MIRALAX / GLYCOLAX) packet Take 17 g by mouth daily as needed for mild constipation. 04/30/2016: Takes if needed  . RANEXA 500 MG 12 hr tablet TAKE (2) TABLETS BY MOUTH TWICE DAILY.   . ranolazine (RANEXA) 1000 MG SR tablet Take 1 tablet (1,000 mg total) by mouth 2 (two) times daily.   . rosuvastatin (CRESTOR) 20 MG tablet Take 1 tablet (20 mg total) by mouth every evening.   . sennosides-docusate sodium (SENOKOT-S) 8.6-50 MG tablet Take 1 tablet by mouth 2 (two) times daily before a meal.   . torsemide (DEMADEX) 20 MG tablet TAKE 1 TABLET BY MOUTH ONCE DAILY FOR SWELLING, MAY TAKE 1 EXTRA DAILY IF SWELLING WORSENS.    No facility-administered encounter medications on file as of 11/28/2017.     Functional Status:   In your present state of health, do you have any difficulty  performing the following activities: 06/14/2017 05/31/2017  Hearing? N N  Vision? N N  Difficulty concentrating or making decisions? N N  Walking or climbing stairs? Y Y  Dressing or bathing? N N  Doing errands, shopping? [name]  Preparing Food and eating ? N N  Using the Toilet? N N  In the past six months, have you accidently leaked urine? N N  Do you have problems with loss of bowel control? N N  Managing your Medications? N N  Managing your Finances? N N  Housekeeping or managing your Housekeeping? - Y  Some recent data might be hidden    Fall/Depression Screening:    Fall Risk  06/20/2017 05/31/2017 05/31/2017  Falls in the past year? No - No  Number falls in past yr: - - -  Injury with Fall? - - -  Risk Factor Category  - - -  Risk for fall due to : - (No Data) Impaired mobility;Impaired balance/gait;Medication side effect;Other (Comment)  Risk for fall due to: Comment - on antivert history of dizziness on   Follow up - - -   PHQ  2/9 Scores 06/20/2017 06/17/2017 05/31/2017 01/31/2017 09/25/2016 05/11/2016 04/13/2016  PHQ - 2 Score 0 0 0 0 0 0 0    Assessment:    Met with Krystal [name] in her apartment in her living room  She is wearing her oxygen   Personal care services continues to visit daily for 4 hours via the Aging and counseling services   Present complaint Back and hip pain - pain level at 8  Continues to have tender teeth but is not able to afford to get recommended teeth fixed or removed.   Kidneys "My kidney doctor is very pleased with me" Saw Nephrologist 11/27/17  Stated she lost 3 lbs Wt was 29 Lbs Her MD called her pharmacy to order her vitamin D,  Diabetes- did not go to her last scheduled endocrinology appointment after A1c taken on 09/22/17 related "to death in the family and I did not feel like going"   Continues to have difficulty keeping records for endocrinologist and maintaining her recommended diet cbg taken 11/28/17 am as 121 Taken during [facility] visit  11/28/17  Cbg=308 on 11/28/17 am prior to this home visit   Last A1c (s)-  01/24/17 =10.4,  06/17/17 =9.5  2017-09-22 =10.4  Sinus- She reports having a headache but has reported this previously. [name] has completed various tests to attempt to find the source of the headache without successful  Upcoming appointment  Primary care provider- [facility] December 03 2017 Cardiology Dr Bronson Ing January 02 2018 1000 seen every 6 months   Plan:   CM discussed meeting her goals and Community CM case closure for community CM.  She denies need to have CM transferred to [place] when CM discussed it Route notes to care team providers  Case closure Letters to be sent to Krystal [name] and [name]  CMA to be updated on case closure for reason- Consumer is not able to progress through the [place] Problem One     Most Recent Value  Care Plan Problem One  Knowledge deficit related to diabetes as evidenced by patient questions around diabetes  Role Documenting the Problem One  Care Management [place] for Problem One  Active  [facility] Long Term Goal   Patient will verbalize understanding of diabetes management over the next 60 days  [facility] Long Term Goal Start Date  06/14/17  [facility] Long Term Goal Met Date  09/18/17  Interventions for Problem One Long Term Goal  Re reviewed how to check cbg and how to give insulin,  discussed current plan of care,  reviewed upcoming appointment with CDE/RD,  reviewed MD recommendations  [facility] CM Short Term Goal #1   Over the next 30 days, patient will verbalize understanding of which medications are used to treat her diabetes  [facility] CM Short Term Goal #1 Start Date  01/24/17  [facility] CM Short Term Goal #1 Met Date  02/28/17  Interventions for Short Term Goal #1  Medication review planned for next week's home visit  [facility] CM Short Term Goal #2   Over the next 30 days, patient will check cbg's daily and record as prescribed  [facility] CM Short Term Goal #2  Start Date  01/24/17  [facility] CM Short Term Goal #2 Met Date  02/28/17  Interventions for Short Term Goal #2  Reviewed prescribed plan to check cbg's daily,  plan review on next week's home visit  [facility] CM Short Term Goal #  3  over the next 30 days, patient will verbalize understanding of basic components of prescribed carb modified diet  [facility] CM Short Term Goal #3 Start Date  01/24/17  [facility] CM Short Term Goal #3 Met Date  02/28/17  Interventions for Short Tern Goal #3  discussed carb modified diet,  prescribed Emmi educaitonal materials,  planned detailed review for next week's home visit       Kimberly L. Lavina Hamman, RN, BSN, [facility] [phone]

## 2017-12-13 ENCOUNTER — Ambulatory Visit (INDEPENDENT_AMBULATORY_CARE_PROVIDER_SITE_OTHER): Payer: Medicare Other | Admitting: "Endocrinology

## 2017-12-13 ENCOUNTER — Encounter: Payer: Self-pay | Admitting: "Endocrinology

## 2017-12-13 VITALS — BP 160/95 | HR 91 | Ht 66.0 in | Wt 187.0 lb

## 2017-12-13 DIAGNOSIS — E782 Mixed hyperlipidemia: Secondary | ICD-10-CM | POA: Diagnosis not present

## 2017-12-13 DIAGNOSIS — E6609 Other obesity due to excess calories: Secondary | ICD-10-CM | POA: Diagnosis not present

## 2017-12-13 DIAGNOSIS — I10 Essential (primary) hypertension: Secondary | ICD-10-CM

## 2017-12-13 DIAGNOSIS — E1122 Type 2 diabetes mellitus with diabetic chronic kidney disease: Secondary | ICD-10-CM

## 2017-12-13 DIAGNOSIS — Z794 Long term (current) use of insulin: Secondary | ICD-10-CM | POA: Diagnosis not present

## 2017-12-13 DIAGNOSIS — Z6831 Body mass index (BMI) 31.0-31.9, adult: Secondary | ICD-10-CM

## 2017-12-13 DIAGNOSIS — N184 Chronic kidney disease, stage 4 (severe): Secondary | ICD-10-CM | POA: Diagnosis not present

## 2017-12-13 MED ORDER — GLUCOSE BLOOD VI STRP: ORAL_STRIP | 2 refills | Status: AC

## 2017-12-13 NOTE — Patient Instructions (Signed)

## 2017-12-13 NOTE — Progress Notes (Signed)
Subjective:    Patient ID: Krystal [name], female    DOB: Aug 29, 1936, PCP Iona Beard, MD   Past Medical History:  Diagnosis Date  . Allergic rhinitis   . CAD (coronary artery disease) 2009   5 stents- #3 in RCA, #1 each in LAD and AVG  . CHF (congestive heart failure) ([place])   . CKD (chronic kidney disease) stage 3, GFR 30-59 ml/min (HCC)   . Complete heart block, transient 2014  . Diastolic dysfunction, left ventricle   . DJD (degenerative joint disease)   . Emphysema lung (HCC)    2L N/C continuously  . Essential hypertension   . GERD (gastroesophageal reflux disease)   . Glaucoma   . Gout   . History of uterine cancer   . HOH (hard of hearing)   . Hyperlipidemia   . MI (myocardial infarction) ([place]) 1998  . Microcytic anemia    History of occult blood in stool  . Myalgia   . Osteoarthritis   . Palpitations   . Personal history of colonic polyps   . Sleep apnea    CPAP  . Type II diabetes mellitus with nephropathy [facility])    Past Surgical History:  Procedure Laterality Date  . ABDOMINAL HYSTERECTOMY  04/2010   Uterine cancer,TAHBSO  . CERVICAL DISCECTOMY     L5 left/hemilminectomy  . COLONOSCOPY  09/2006   Int hemmorhoids, COMPLICATED BY CARDIOPULMONARY COMPLICATIONS  . CORONARY ANGIOPLASTY  1/99, 1/07, 1/08, 4/09   5 cardiac stents total  . ECTOPIC PREGNANCY SURGERY    . FOOT SURGERY     Left and right for callous  . LEFT AND RIGHT HEART CATHETERIZATION WITH CORONARY ANGIOGRAM N/A 09/12/2012   Procedure: LEFT AND RIGHT HEART CATHETERIZATION WITH CORONARY ANGIOGRAM;  Surgeon: Sanda Klein, MD;  Location: [place] CATH LAB;  Service: Cardiovascular;  Laterality: N/A;  . POLYPECTOMY  10/22/2011   Internal hemorrhoids/sessile polyp   Social History   Socioeconomic History  . Marital status: Widowed    Spouse name: None  . Number of children: 0  . Years of education: 86  . Highest education level: None  Social Needs  . Financial resource strain: None  .  Food insecurity - worry: None  . Food insecurity - inability: None  . Transportation needs - medical: None  . Transportation needs - non-medical: None  Occupational History    Comment: retired  Tobacco Use  . Smoking status: Former Smoker    Packs/day: 0.40    Years: 51.00    Pack years: 20.40    Types: Cigarettes    Start date: 04/22/1955    Last attempt to quit: 10/18/2006    Years since quitting: 11.1  . Smokeless tobacco: Never Used  . Tobacco comment: quit about 4 yrs ago  Substance and Sexual Activity  . Alcohol use: No    Alcohol/week: 0.0 oz  . Drug use: No  . Sexual activity: No    Birth control/protection: Surgical  Other Topics Concern  . None  Social History Narrative   Consumes 2 cups of caffeine daily   Outpatient Encounter Medications as of 12/13/2017  Medication Sig  . ergocalciferol (VITAMIN D2) 50000 units capsule Take 50,000 Units by mouth once a week.  . [DISCONTINUED] Vitamin D, Ergocalciferol, (DRISDOL) 50000 units CAPS capsule Take 50,000 Units by mouth every 7 (seven) days.  Marland Kitchen aspirin 81 MG EC tablet Take 81 mg by mouth every morning.   . BD PEN NEEDLE NANO U/F 32G X  4 MM MISC USE AS DIRECTED TWICE DAILY.  . budesonide-formoterol (SYMBICORT) 160-4.5 MCG/ACT inhaler Inhale 2 puffs into the lungs 2 (two) times daily.  . calcitRIOL (ROCALTROL) 0.25 MCG capsule Take 0.25 mcg by mouth daily. Reported on 03/30/2016  . clopidogrel (PLAVIX) 75 MG tablet TAKE (1) TABLET BY MOUTH ONCE DAILY.  Marland Kitchen Continuous Blood Gluc Sensor (FREESTYLE LIBRE SENSOR SYSTEM) MISC Use one sensor every 10 days.  Marland Kitchen ezetimibe (ZETIA) 10 MG tablet TAKE 1 TABLET BY MOUTH EVERY MORNING.  . famotidine (PEPCID) 20 MG tablet Take 1 tablet (20 mg total) by mouth daily.  . Fluticasone-Salmeterol (ADVAIR DISKUS) 250-50 MCG/DOSE AEPB Inhale 1 puff into the lungs 2 (two) times daily.  Marland Kitchen glucose blood (ACCU-CHEK GUIDE) test strip Use as instructed 4 x daily  . Insulin Aspart Prot & Aspart (NOVOLOG MIX  70/30 FLEXPEN [place]) Inject 40-60 Units into the skin 2 (two) times daily with a meal.  . Insulin Pen Needle (B-D ULTRAFINE III SHORT PEN) 31G X 8 MM MISC 1 each by Does not apply route as directed.  . isosorbide mononitrate (IMDUR) 60 MG 24 hr tablet TAKE 1 TABLET ONCE DAILY.  . isosorbide mononitrate (IMDUR) 60 MG 24 hr tablet TAKE (1) TABLET BY MOUTH ONCE DAILY.  Marland Kitchen Lancets (ACCU-CHEK SOFT TOUCH) lancets Use as instructed bid. E11.65.  Marland Kitchen lisinopril (PRINIVIL,ZESTRIL) 20 MG tablet TAKE (1) TABLET BY MOUTH ONCE DAILY.  . meclizine (ANTIVERT) 25 MG tablet Take 25 mg by mouth 4 (four) times daily as needed for dizziness.  . nitroGLYCERIN (NITROSTAT) 0.4 MG SL tablet Place 1 tablet (0.4 mg total) under the tongue every 5 (five) minutes as needed for chest pain.  . polyethylene glycol (MIRALAX / GLYCOLAX) packet Take 17 g by mouth daily as needed for mild constipation.  Marland Kitchen RANEXA 500 MG 12 hr tablet TAKE (2) TABLETS BY MOUTH TWICE DAILY.  . ranolazine (RANEXA) 1000 MG SR tablet Take 1 tablet (1,000 mg total) by mouth 2 (two) times daily.  . rosuvastatin (CRESTOR) 20 MG tablet Take 1 tablet (20 mg total) by mouth every evening.  . sennosides-docusate sodium (SENOKOT-S) 8.6-50 MG tablet Take 1 tablet by mouth 2 (two) times daily before a meal.  . torsemide (DEMADEX) 20 MG tablet TAKE 1 TABLET BY MOUTH ONCE DAILY FOR SWELLING, MAY TAKE 1 EXTRA DAILY IF SWELLING WORSENS.  . [DISCONTINUED] glucose blood (ACCU-CHEK GUIDE) test strip Use as instructed  . [DISCONTINUED] NOVOLOG MIX 70/30 FLEXPEN (70-30) 100 UNIT/ML FlexPen INJECT 50 UNITS IN THE MONRING AND 40 UNITS IN THE EVENING.   No facility-administered encounter medications on file as of 12/13/2017.    ALLERGIES: Allergies  Allergen Reactions  . Morphine Shortness Of Breath and Swelling  . Penicillins Shortness Of Breath and Swelling    Has patient had a PCN reaction causing immediate rash, facial/tongue/throat swelling, SOB or lightheadedness with  hypotension: No Has patient had a PCN reaction causing severe rash involving mucus membranes or skin necrosis: No Has patient had a PCN reaction that required hospitalization Yes Has patient had a PCN reaction occurring within the last 10 years: No If all of the above answers are "NO", then may proceed with Cephalosporin use.   . Shellfish Allergy    VACCINATION STATUS: Immunization History  Administered Date(s) Administered  . Influenza Whole 09/23/2007  . Influenza,inj,Quad PF,6+ Mos 08/20/2015  . Influenza-Unspecified 09/19/2013  . Pneumococcal Polysaccharide-23 08/20/2015    Diabetes  She presents for her follow-up diabetic visit. She has type 2 diabetes  mellitus. Onset time: She was diagnosed at approximate age of 35 years. Her disease course has been worsening. There are no hypoglycemic associated symptoms. Pertinent negatives for hypoglycemia include no confusion, headaches, pallor or seizures. Associated symptoms include fatigue, polydipsia and polyuria. Pertinent negatives for diabetes include no chest pain and no polyphagia. There are no hypoglycemic complications. Symptoms are worsening. Diabetic complications include heart disease, nephropathy, peripheral neuropathy and PVD. Risk factors for coronary artery disease include dyslipidemia, diabetes mellitus, hypertension, obesity, sedentary lifestyle and tobacco exposure. Current diabetic treatment includes insulin injections (She was asked to bring all of her insulin supplies at home. She managed to bring to bags of various kinds of insulin mainly duplicates of long-acting, premixed, and rapid acting insulin types. At least half of the insulin pens she brought in are expi). Her weight is decreasing steadily. She is following a generally unhealthy diet. When asked about meal planning, she reported none. She has had a previous visit with a dietitian. She never participates in exercise. Her home blood glucose trend is fluctuating  dramatically (He did brought her meter and log showing above target BG readings , monitoring 2-3 times a day, E AG between 100-170.). Her breakfast blood glucose range is generally 180-200 mg/dl. Her dinner blood glucose range is generally >200 mg/dl. Her overall blood glucose range is >200 mg/dl. An ACE inhibitor/angiotensin II receptor blocker is being taken. Eye exam is current.  Hyperlipidemia  This is a chronic problem. The current episode started more than 1 year ago. The problem is controlled. Exacerbating diseases include diabetes and obesity. Pertinent negatives include no chest pain, myalgias or shortness of breath. Current antihyperlipidemic treatment includes bile acid squestrants and statins. Risk factors for coronary artery disease include diabetes mellitus, dyslipidemia, hypertension and obesity.  Hypertension  This is a chronic problem. The current episode started more than 1 year ago. Pertinent negatives include no chest pain, headaches, palpitations or shortness of breath. Risk factors for coronary artery disease include diabetes mellitus, dyslipidemia, obesity, sedentary lifestyle and smoking/tobacco exposure. Past treatments include ACE inhibitors. Hypertensive end-organ damage includes kidney disease, CAD/MI and PVD.    Review of Systems  Constitutional: Positive for fatigue. Negative for unexpected weight change.  HENT: Negative for trouble swallowing and voice change.   Eyes: Negative for visual disturbance.  Respiratory: Negative for cough, shortness of breath and wheezing.   Cardiovascular: Negative for chest pain, palpitations and leg swelling.  Gastrointestinal: Negative for diarrhea, nausea and vomiting.  Endocrine: Positive for polydipsia and polyuria. Negative for cold intolerance, heat intolerance and polyphagia.  Musculoskeletal: Negative for arthralgias and myalgias.  Skin: Negative for color change, pallor, rash and wound.  Neurological: Negative for seizures and  headaches.  Psychiatric/Behavioral: Negative for confusion and suicidal ideas.    Objective:    BP (!) 160/95   Pulse 91   Ht 5\' 6"  (1.676 m)   Wt 187 lb (84.8 kg)   BMI 30.18 kg/m   Wt Readings from Last 3 Encounters:  12/13/17 187 lb (84.8 kg)  09/05/17 194 lb (88 kg)  07/05/17 200 lb 3.2 oz (90.8 kg)    Physical Exam  Constitutional: She is oriented to person, place, and time. She appears well-developed.  HENT:  Head: Normocephalic and atraumatic.  Eyes: EOM are normal.  Neck: Normal range of motion. Neck supple. No tracheal deviation present. No thyromegaly present.  Cardiovascular: Normal rate and regular rhythm.  Pulses:      Dorsalis pedis pulses are 0 on the right side,  and 0 on the left side.       Posterior tibial pulses are 0 on the right side, and 0 on the left side.  She has absent pulsations on right lateral dorsalis pedis and posterior tibial arteries.  Pulmonary/Chest: Effort normal and breath sounds normal.  Abdominal: Soft. Bowel sounds are normal. There is no tenderness. There is no guarding.  Musculoskeletal: Normal range of motion. She exhibits no edema.  Neurological: She is alert and oriented to person, place, and time. She has normal reflexes. A sensory deficit is present. No cranial nerve deficit. Coordination normal.  She has significant deficit on monofilament test on bilateral feet.  Skin: Skin is warm and dry. No rash noted. No erythema. No pallor.  Psychiatric:  She is significantly reluctant with possible cognitive deficit.    Complete Blood Count (Most recent): Lab Results  Component Value Date   WBC 8.3 11/08/2015   HGB 9.9 (L) 11/08/2015   HCT 32.6 (L) 11/08/2015   MCV 75.8 (L) 11/08/2015   PLT 263 11/08/2015   Chemistry (most recent): Lab Results  Component Value Date   NA 141 09/17/2017   K 4.0 09/17/2017   CL 101 09/17/2017   CO2 32 09/17/2017   BUN 28 (H) 09/17/2017   CREATININE 2.35 (H) 09/17/2017   Diabetic Labs (most  recent): Lab Results  Component Value Date   HGBA1C 10.4 (H) 09/17/2017   HGBA1C 9.5 (H) 06/17/2017   HGBA1C 10.4 (H) 01/24/2017   Lipid Panel     Component Value Date/Time   CHOL 151 05/07/2016 1006   TRIG 217 (H) 05/07/2016 1006   HDL 37 (L) 05/07/2016 1006   CHOLHDL 4.1 05/07/2016 1006   VLDL 43 (H) 05/07/2016 1006   LDLCALC 71 05/07/2016 1006   LDLDIRECT 118.4 08/13/2013 1502     Assessment & Plan:   1. Uncontrolled type 2 diabetes mellitus with other specified complication ([place])  - Her  diabetes is  complicated by coronary artery disease, chronic kidney disease, peripheral neuropathy, and peripheral arterial disease and patient remains at a high risk for more acute and chronic complications of diabetes which include CAD, CVA, CKD, retinopathy, and neuropathy. These are all discussed in detail with the patient.  -Her recent A1c  is higher at 10.4% increasing from 9.5%, after generally improving from 12.2%.   -She admits significant dietary indiscretion, admits to ingestion of large amounts of soda and processed carbohydrates. Recent labs reviewed.   - I have re-counseled the patient on diet management  by adopting a carbohydrate restricted / protein rich  Diet.  -  Suggestion is made for her to avoid simple carbohydrates  from her diet including Cakes, Sweet Desserts / Pastries, Ice Cream, Soda (diet and regular), Sweet Tea, Candies, Chips, Cookies, Store Bought Juices, Alcohol in Excess of  1-2 drinks a day, Artificial Sweeteners, and "Sugar-free" Products. This will help patient to have stable blood glucose profile and potentially avoid unintended weight gain.   - Patient is advised to stick to a routine mealtimes to eat 3 meals  a day and avoid unnecessary snacks ( to snack only to correct hypoglycemia).   - I have approached patient with the following individualized plan to manage diabetes and patient agrees.   - Mrs. Kilcrease lives alone, has significant cognitive  deficit, worries about hypoglycemia. -  Goal #1 in her treatment of diabetes would be to avoid hypoglycemia. - She  would have benefited from basal/bolus insulin however this was too overwhelming for  her.   -  She wishes to continue  on premixed insulin Novolog 70/30,  60 units with breakfast and 40 units with supper for pre-meal blood glucose above 90 mg/dL, associated with strict monitoring of blood glucose 4 times a day-before meals and at bedtime.  -Patient is encouraged to call clinic for blood glucose levels less than 70 or above 300 mg /dl.  - She would have  benefitted from continuous glucose monitoring,   her insurance did not provide coverage.   - Patient specific target  for A1c; LDL, HDL, Triglycerides, and  Waist Circumference were discussed in detail.  2) BP/HTN:   her blood pressure is not controlled, currently at 160/95.  She is not reliable for taking her blood pressure medications consistently.   - I advised her to resume her current medications including ACEI/ARB. She is also on torsemide, and Imdur.  3) Lipids/HPL:  continue ezetimibe, continue Crestor 20 mg by mouth daily at bedtime.   4)  Weight/Diet: CDE consult in progress, exercise, and carbohydrates information provided.  5) Chronic Care/Health Maintenance:  -Patient  on ACEI/ARB and Statin medications and encouraged to continue to follow up with Ophthalmology, Podiatrist at least yearly or according to recommendations, and advised to  stay away from smoking. I have recommended yearly flu vaccine and pneumonia vaccination at least every 5 years; moderate intensity exercise for up to 150 minutes weekly; and  sleep for at least 7 hours a day. - She has significant peripheral neuropathy and peripheral arterial disease putting her at risk for diabetes foot ulcer.   - Time spent with the patient: 25 min, of which >50% was spent in reviewing her blood glucose logs , discussing her hypo- and hyper-glycemic episodes,  reviewing her current and  previous labs and insulin doses and developing a plan to avoid hypo- and hyper-glycemia. Please refer to Patient Instructions for Blood Glucose Monitoring and Insulin/Medications Dosing Guide"  in media tab for additional information.  - I advised patient to maintain close follow up with Iona Beard, MD for primary care needs.   Follow up plan: -Return in about 3 months (around 03/13/2018) for meter, and logs.  Glade Lloyd, MD Phone: [phone]  Fax: [phone]  -  This note was partially dictated with voice recognition software. Similar sounding words can be transcribed inadequately or may not  be corrected upon review.  12/13/2017, 12:38 PM

## 2017-12-17 DIAGNOSIS — I10 Essential (primary) hypertension: Secondary | ICD-10-CM | POA: Diagnosis not present

## 2017-12-17 DIAGNOSIS — N184 Chronic kidney disease, stage 4 (severe): Secondary | ICD-10-CM | POA: Diagnosis not present

## 2017-12-17 DIAGNOSIS — I110 Hypertensive heart disease with heart failure: Secondary | ICD-10-CM | POA: Diagnosis not present

## 2017-12-17 DIAGNOSIS — E1165 Type 2 diabetes mellitus with hyperglycemia: Secondary | ICD-10-CM | POA: Diagnosis not present

## 2017-12-20 ENCOUNTER — Other Ambulatory Visit: Payer: Self-pay | Admitting: "Endocrinology

## 2018-01-02 ENCOUNTER — Encounter: Payer: Self-pay | Admitting: Cardiovascular Disease

## 2018-01-02 ENCOUNTER — Ambulatory Visit (INDEPENDENT_AMBULATORY_CARE_PROVIDER_SITE_OTHER): Payer: Medicare Other | Admitting: Cardiovascular Disease

## 2018-01-02 VITALS — BP 126/88 | HR 95 | Ht 67.0 in | Wt 188.0 lb

## 2018-01-02 DIAGNOSIS — I25118 Atherosclerotic heart disease of native coronary artery with other forms of angina pectoris: Secondary | ICD-10-CM | POA: Diagnosis not present

## 2018-01-02 DIAGNOSIS — G4733 Obstructive sleep apnea (adult) (pediatric): Secondary | ICD-10-CM

## 2018-01-02 DIAGNOSIS — I5032 Chronic diastolic (congestive) heart failure: Secondary | ICD-10-CM

## 2018-01-02 DIAGNOSIS — I10 Essential (primary) hypertension: Secondary | ICD-10-CM | POA: Diagnosis not present

## 2018-01-02 DIAGNOSIS — E7800 Pure hypercholesterolemia, unspecified: Secondary | ICD-10-CM

## 2018-01-02 DIAGNOSIS — I208 Other forms of angina pectoris: Secondary | ICD-10-CM

## 2018-01-02 MED ORDER — METOPROLOL TARTRATE 25 MG PO TABS: 25.0000 mg | ORAL_TABLET | Freq: Two times a day (BID) | ORAL | 3 refills | Status: AC

## 2018-01-02 MED ORDER — RIVAROXABAN 2.5 MG PO TABS: 2.5000 mg | ORAL_TABLET | Freq: Two times a day (BID) | ORAL | 6 refills | Status: AC

## 2018-01-02 NOTE — Progress Notes (Signed)
SUBJECTIVE: The patient presents for routine cardiovascular followup. In summary, she has a history of chronic diastolic heart failure, hypertension, coronary artery disease with multiple previously placed stents, COPD and is on chronic oxygen at home (2L), sleep apnea and uses CPAP, hyperlipidemia, and insulin-dependent diabetes mellitus.   Echocardiogram on 04/15/2015 demonstrated vigorous left ventricular systolic function, EF 16-10%, severe LVH, grade 1 diastolic dysfunction, elevated filling pressures, trivial mitral regurgitation, trivial tricuspid regurgitation, and normal pulmonary pressures of 27 mmHg.   As per Dr. Evette Georges note on 02/14/14, she has coronary artery disease dating back to the 1990s and has undergone multiple stent procedures involving the proximal LAD, circumflex, mid right coronary artery, and distal right coronary artery. Her last catheterization in October 2013 did not reveal high-grade restenosis. She did have 80-90% ostial stenosis and a small jailed first septal perforating artery. She had mild calcification and 20-30% narrowing in the stent in the circumflex vessel. The right coronary artery had 3 stents and there was 40-50% in-stent smooth narrowing with 30% narrowing in the distal stent.   She tells me she has been having episodic chest pains for the past 2 weeks.  They can occur both at rest and with exertion.  Symptoms may only last a few seconds.  She has tried sublingual nitroglycerin on at least 2 occasions with eventual symptom relief.  She gets dizzy when bending over and sitting back upright.  She has occasional palpitations.  She said her blood pressure was elevated at her PCPs office about a week and a half ago.  ECG performed today demonstrates sinus rhythm with diffuse nonspecific T wave abnormalities and an isolated PVC.      Review of Systems: As per "subjective", otherwise negative.  Allergies  Allergen Reactions  . Morphine Shortness Of  Breath and Swelling  . Penicillins Shortness Of Breath and Swelling    Has patient had a PCN reaction causing immediate rash, facial/tongue/throat swelling, SOB or lightheadedness with hypotension: No Has patient had a PCN reaction causing severe rash involving mucus membranes or skin necrosis: No Has patient had a PCN reaction that required hospitalization Yes Has patient had a PCN reaction occurring within the last 10 years: No If all of the above answers are "NO", then may proceed with Cephalosporin use.   . Shellfish Allergy     Current Outpatient Medications  Medication Sig Dispense Refill  . ACCU-CHEK FASTCLIX LANCETS MISC USE AS DIRECTED TWICE DAILY. 102 each 11  . ACCU-CHEK FASTCLIX LANCETS MISC USE AS DIRECTED TWICE DAILY. 102 each 11  . aspirin 81 MG EC tablet Take 81 mg by mouth every morning.     . BD PEN NEEDLE NANO U/F 32G X 4 MM MISC USE AS DIRECTED TWICE DAILY. 60 each 10  . budesonide-formoterol (SYMBICORT) 160-4.5 MCG/ACT inhaler Inhale 2 puffs into the lungs 2 (two) times daily.    . calcitRIOL (ROCALTROL) 0.25 MCG capsule Take 0.25 mcg by mouth daily. Reported on 03/30/2016    . clopidogrel (PLAVIX) 75 MG tablet TAKE (1) TABLET BY MOUTH ONCE DAILY. 28 tablet 11  . Continuous Blood Gluc Sensor (FREESTYLE LIBRE SENSOR SYSTEM) MISC Use one sensor every 10 days. 3 each 2  . ergocalciferol (VITAMIN D2) 50000 units capsule Take 50,000 Units by mouth once a week.    . ezetimibe (ZETIA) 10 MG tablet TAKE 1 TABLET BY MOUTH EVERY MORNING. 30 tablet 6  . famotidine (PEPCID) 20 MG tablet Take 1 tablet (20 mg total) by  mouth daily. 30 tablet 1  . Fluticasone-Salmeterol (ADVAIR DISKUS) 250-50 MCG/DOSE AEPB Inhale 1 puff into the lungs 2 (two) times daily. 60 each 1  . glucose blood (ACCU-CHEK GUIDE) test strip Use as instructed 4 x daily 150 each 2  . Insulin Aspart Prot & Aspart (NOVOLOG MIX 70/30 FLEXPEN [place]) Inject 40-60 Units into the skin 2 (two) times daily with a meal.    .  Insulin Pen Needle (B-D ULTRAFINE III SHORT PEN) 31G X 8 MM MISC 1 each by Does not apply route as directed. 100 each 3  . isosorbide mononitrate (IMDUR) 60 MG 24 hr tablet TAKE (1) TABLET BY MOUTH ONCE DAILY. 28 tablet 11  . lisinopril (PRINIVIL,ZESTRIL) 20 MG tablet TAKE (1) TABLET BY MOUTH ONCE DAILY. 30 tablet 6  . meclizine (ANTIVERT) 25 MG tablet Take 25 mg by mouth 4 (four) times daily as needed for dizziness.    . nitroGLYCERIN (NITROSTAT) 0.4 MG SL tablet Place 1 tablet (0.4 mg total) under the tongue every 5 (five) minutes as needed for chest pain. 25 tablet 3  . polyethylene glycol (MIRALAX / GLYCOLAX) packet Take 17 g by mouth daily as needed for mild constipation. 30 each 0  . RANEXA 500 MG 12 hr tablet TAKE (2) TABLETS BY MOUTH TWICE DAILY. 64 tablet 11  . ranolazine (RANEXA) 1000 MG SR tablet Take 1 tablet (1,000 mg total) by mouth 2 (two) times daily. 60 tablet 6  . rosuvastatin (CRESTOR) 20 MG tablet Take 1 tablet (20 mg total) by mouth every evening. 30 tablet 1  . sennosides-docusate sodium (SENOKOT-S) 8.6-50 MG tablet Take 1 tablet by mouth 2 (two) times daily before a meal.    . torsemide (DEMADEX) 20 MG tablet TAKE 1 TABLET BY MOUTH ONCE DAILY FOR SWELLING, MAY TAKE 1 EXTRA DAILY IF SWELLING WORSENS. 60 tablet 10   No current facility-administered medications for this visit.     Past Medical History:  Diagnosis Date  . Allergic rhinitis   . CAD (coronary artery disease) 2009   5 stents- #3 in RCA, #1 each in LAD and AVG  . CHF (congestive heart failure) ([place])   . CKD (chronic kidney disease) stage 3, GFR 30-59 ml/min (HCC)   . Complete heart block, transient 2014  . Diastolic dysfunction, left ventricle   . DJD (degenerative joint disease)   . Emphysema lung (HCC)    2L N/C continuously  . Essential hypertension   . GERD (gastroesophageal reflux disease)   . Glaucoma   . Gout   . History of uterine cancer   . HOH (hard of hearing)   . Hyperlipidemia   . MI  (myocardial infarction) ([place]) 1998  . Microcytic anemia    History of occult blood in stool  . Myalgia   . Osteoarthritis   . Palpitations   . Personal history of colonic polyps   . Sleep apnea    CPAP  . Type II diabetes mellitus with nephropathy [facility])     Past Surgical History:  Procedure Laterality Date  . ABDOMINAL HYSTERECTOMY  04/2010   Uterine cancer,TAHBSO  . CERVICAL DISCECTOMY     L5 left/hemilminectomy  . COLONOSCOPY  09/2006   Int hemmorhoids, COMPLICATED BY CARDIOPULMONARY COMPLICATIONS  . CORONARY ANGIOPLASTY  1/99, 1/07, 1/08, 4/09   5 cardiac stents total  . ECTOPIC PREGNANCY SURGERY    . FOOT SURGERY     Left and right for callous  . LEFT AND RIGHT HEART CATHETERIZATION WITH CORONARY ANGIOGRAM  N/A 09/12/2012   Procedure: LEFT AND RIGHT HEART CATHETERIZATION WITH CORONARY ANGIOGRAM;  Surgeon: Sanda Klein, MD;  Location: [place] CATH LAB;  Service: Cardiovascular;  Laterality: N/A;  . POLYPECTOMY  10/22/2011   Internal hemorrhoids/sessile polyp    Social History   Socioeconomic History  . Marital status: Widowed    Spouse name: Not on file  . Number of children: 0  . Years of education: 8  . Highest education level: Not on file  Social Needs  . Financial resource strain: Not on file  . Food insecurity - worry: Not on file  . Food insecurity - inability: Not on file  . Transportation needs - medical: Not on file  . Transportation needs - non-medical: Not on file  Occupational History    Comment: retired  Tobacco Use  . Smoking status: Former Smoker    Packs/day: 0.40    Years: 51.00    Pack years: 20.40    Types: Cigarettes    Start date: 04/22/1955    Last attempt to quit: 10/18/2006    Years since quitting: 11.2  . Smokeless tobacco: Never Used  . Tobacco comment: quit about 4 yrs ago  Substance and Sexual Activity  . Alcohol use: No    Alcohol/week: 0.0 oz  . Drug use: No  . Sexual activity: No    Birth control/protection: Surgical  Other  Topics Concern  . Not on file  Social History Narrative   Consumes 2 cups of caffeine daily     Vitals:   01/02/18 1023  BP: 126/88  Pulse: 95  SpO2: 97%  Weight: 188 lb (85.3 kg)  Height: 5\' 7"  (1.702 m)    Wt Readings from Last 3 Encounters:  01/02/18 188 lb (85.3 kg)  12/13/17 187 lb (84.8 kg)  11/28/17 188 lb (85.3 kg)     PHYSICAL EXAM General: NAD HEENT: Normal. Neck: No JVD, no thyromegaly. Lungs: Diminished but clear b/l. No rales or wheezes appreciated. CV: Regular rate and rhythm, normal S1/S2, no S3/S4, no murmur. No pretibial or periankle edema.   Abdomen: Soft, nontender, no distention.  Neurologic: Alert and oriented.  Psych: Normal affect. Skin: Normal. Musculoskeletal: No gross deformities.    ECG: Most recent ECG reviewed.   Labs: Lab Results  Component Value Date/Time   K 4.0 09/17/2017 10:39 AM   BUN 28 (H) 09/17/2017 10:39 AM   BUN 44 (H) 11/01/2014 03:09 PM   CREATININE 2.35 (H) 09/17/2017 10:39 AM   ALT 11 06/17/2017 11:16 AM   TSH 3.80 05/07/2016 10:06 AM   HGB 9.9 (L) 11/08/2015 05:47 AM     Lipids: Lab Results  Component Value Date/Time   LDLCALC 71 05/07/2016 10:06 AM   LDLDIRECT 118.4 08/13/2013 03:02 PM   CHOL 151 05/07/2016 10:06 AM   TRIG 217 (H) 05/07/2016 10:06 AM   HDL 37 (L) 05/07/2016 10:06 AM       ASSESSMENT AND PLAN:  1. Chronic diastolic heart failure: She is currently euvolemic and stable. Blood pressure is controlled. Continue torsemide 20 mg. Instructed to take extra 20 mg for increased leg swelling.  2. CAD with angina: She has been having intermittent anginal symptoms over the past 2 weeks.  I will start metoprolol tartrate 25 mg twice daily.  Such a low dose should not cause bronchospasm. Continue Ranexa 1000 mg twice daily along with aspirin, Zetia, Imdur, and Crestor. She underwent a low risk normal nuclear stress test in September 2014.  I will stop Plavix and  start Xarelto 2.5 mg twice daily (new  coronary artery disease dosing).  3. Essential hypertension:  Controlled.  She said it was elevated at her PCPs office.  I will obtain PCP office notes.  As I am starting metoprolol tartrate for anginal relief, blood pressure will need continued monitoring.  4. Hyperlipidemia: Continue current therapy with Crestor and Zetia.   5. Mild OSA.    Disposition: Follow up 10 weeks   Kate Sable, M.D., F.A.C.C.

## 2018-01-02 NOTE — Patient Instructions (Signed)
Your physician recommends that you schedule a follow-up appointment in: 10 weeks with Dr.Koneswaran   STOP PLAVIX   START Lopressor 25 mg twice a day   START Xarelto  2.5 mg twice a day    Samples given to you today #28 tabs, lot [id number], sept 2021    No lab work or tests ordered today.      Thank you for choosing [facility] !

## 2018-01-21 ENCOUNTER — Encounter (HOSPITAL_COMMUNITY): Payer: Self-pay | Admitting: Emergency Medicine

## 2018-01-21 ENCOUNTER — Other Ambulatory Visit: Payer: Self-pay

## 2018-01-21 ENCOUNTER — Emergency Department (HOSPITAL_COMMUNITY)
Admission: EM | Admit: 2018-01-21 | Discharge: 2018-01-21 | Disposition: A | Discharge status: Home / Self Care | Payer: Medicare Other | Attending: Emergency Medicine | Admitting: Emergency Medicine

## 2018-01-21 ENCOUNTER — Emergency Department (HOSPITAL_COMMUNITY): Payer: Medicare Other

## 2018-01-21 DIAGNOSIS — E1122 Type 2 diabetes mellitus with diabetic chronic kidney disease: Secondary | ICD-10-CM | POA: Insufficient documentation

## 2018-01-21 DIAGNOSIS — Z79899 Other long term (current) drug therapy: Secondary | ICD-10-CM | POA: Diagnosis not present

## 2018-01-21 DIAGNOSIS — E1042 Type 1 diabetes mellitus with diabetic polyneuropathy: Secondary | ICD-10-CM | POA: Diagnosis not present

## 2018-01-21 DIAGNOSIS — I2510 Atherosclerotic heart disease of native coronary artery without angina pectoris: Secondary | ICD-10-CM | POA: Diagnosis not present

## 2018-01-21 DIAGNOSIS — Z7982 Long term (current) use of aspirin: Secondary | ICD-10-CM | POA: Insufficient documentation

## 2018-01-21 DIAGNOSIS — R531 Weakness: Secondary | ICD-10-CM | POA: Diagnosis not present

## 2018-01-21 DIAGNOSIS — Z87891 Personal history of nicotine dependence: Secondary | ICD-10-CM | POA: Insufficient documentation

## 2018-01-21 DIAGNOSIS — R51 Headache: Secondary | ICD-10-CM | POA: Diagnosis not present

## 2018-01-21 DIAGNOSIS — R42 Dizziness and giddiness: Secondary | ICD-10-CM | POA: Diagnosis not present

## 2018-01-21 DIAGNOSIS — R404 Transient alteration of awareness: Secondary | ICD-10-CM | POA: Diagnosis not present

## 2018-01-21 DIAGNOSIS — I252 Old myocardial infarction: Secondary | ICD-10-CM | POA: Diagnosis not present

## 2018-01-21 DIAGNOSIS — I5033 Acute on chronic diastolic (congestive) heart failure: Secondary | ICD-10-CM | POA: Diagnosis not present

## 2018-01-21 DIAGNOSIS — N183 Chronic kidney disease, stage 3 (moderate): Secondary | ICD-10-CM | POA: Diagnosis not present

## 2018-01-21 DIAGNOSIS — R55 Syncope and collapse: Secondary | ICD-10-CM | POA: Insufficient documentation

## 2018-01-21 DIAGNOSIS — M6281 Muscle weakness (generalized): Secondary | ICD-10-CM | POA: Diagnosis not present

## 2018-01-21 DIAGNOSIS — Z7901 Long term (current) use of anticoagulants: Secondary | ICD-10-CM | POA: Diagnosis not present

## 2018-01-21 DIAGNOSIS — Z794 Long term (current) use of insulin: Secondary | ICD-10-CM | POA: Insufficient documentation

## 2018-01-21 DIAGNOSIS — R4020 Unspecified coma: Secondary | ICD-10-CM | POA: Diagnosis not present

## 2018-01-21 DIAGNOSIS — I130 Hypertensive heart and chronic kidney disease with heart failure and stage 1 through stage 4 chronic kidney disease, or unspecified chronic kidney disease: Secondary | ICD-10-CM | POA: Diagnosis not present

## 2018-01-21 LAB — COMPREHENSIVE METABOLIC PANEL WITH GFR
ALT: 11 U/L — ABNORMAL LOW (ref 14–54)
AST: 13 U/L — ABNORMAL LOW (ref 15–41)
Albumin: 3.6 g/dL (ref 3.5–5.0)
Alkaline Phosphatase: 71 U/L (ref 38–126)
Anion gap: 12 (ref 5–15)
BUN: 24 mg/dL — ABNORMAL HIGH (ref 6–20)
CO2: 27 mmol/L (ref 22–32)
Calcium: 9.3 mg/dL (ref 8.9–10.3)
Chloride: 97 mmol/L — ABNORMAL LOW (ref 101–111)
Creatinine, Ser: 2.47 mg/dL — ABNORMAL HIGH (ref 0.44–1.00)
GFR calc Af Amer: 20 mL/min — ABNORMAL LOW
GFR calc non Af Amer: 17 mL/min — ABNORMAL LOW
Glucose, Bld: 344 mg/dL — ABNORMAL HIGH (ref 65–99)
Potassium: 3.7 mmol/L (ref 3.5–5.1)
Sodium: 136 mmol/L (ref 135–145)
Total Bilirubin: 0.5 mg/dL (ref 0.3–1.2)
Total Protein: 7.4 g/dL (ref 6.5–8.1)

## 2018-01-21 LAB — CBC
HEMATOCRIT: 39.2 % (ref 36.0–46.0)
HEMOGLOBIN: 11.9 g/dL — AB (ref 12.0–15.0)
MCH: 23 pg — ABNORMAL LOW (ref 26.0–34.0)
MCHC: 30.4 g/dL (ref 30.0–36.0)
MCV: 75.8 fL — AB (ref 78.0–100.0)
Platelets: 242 10*3/uL (ref 150–400)
RBC: 5.17 MIL/uL — AB (ref 3.87–5.11)
RDW: 15.3 % (ref 11.5–15.5)
WBC: 6.7 10*3/uL (ref 4.0–10.5)

## 2018-01-21 LAB — TROPONIN I: TROPONIN I: 0.04 ng/mL — AB (ref <0.03)

## 2018-01-21 MED ORDER — SODIUM CHLORIDE 0.9 % IV BOLUS (SEPSIS)
1000.0000 mL | Freq: Once | INTRAVENOUS | Status: AC
Start: 2018-01-21 — End: 2018-01-21
  Administered 2018-01-21: 1000 mL via INTRAVENOUS

## 2018-01-21 MED ORDER — TRAMADOL HCL 50 MG PO TABS: 50.0000 mg | ORAL_TABLET | Freq: Four times a day (QID) | ORAL | 0 refills | Status: DC | PRN

## 2018-01-21 MED ORDER — HYDROCODONE-ACETAMINOPHEN 5-325 MG PO TABS
1.0000 | ORAL_TABLET | Freq: Once | ORAL | Status: AC
  Administered 2018-01-21: 1 via ORAL
  Filled 2018-01-21: qty 1

## 2018-01-21 NOTE — ED Provider Notes (Signed)
[facility] EMERGENCY DEPARTMENT Provider Note   CSN: [id number] Arrival date & time: 01/21/18  1641     History   Chief Complaint Chief Complaint  Patient presents with  . Near Syncope    HPI Krystal [name] is a 82 y.o. female.  Patient complained of dizziness weakness and headache.  Patient has had a headache for 1 week.   The history is provided by the patient. No language interpreter was used.  Illness  This is a new problem. The current episode started 2 days ago. The problem occurs constantly. The problem has not changed since onset.Pertinent negatives include no chest pain, no abdominal pain and no headaches. Nothing aggravates the symptoms. Nothing relieves the symptoms. She has tried nothing for the symptoms.    Past Medical History:  Diagnosis Date  . Allergic rhinitis   . CAD (coronary artery disease) 2009   5 stents- #3 in RCA, #1 each in LAD and AVG  . CHF (congestive heart failure) ([place])   . CKD (chronic kidney disease) stage 3, GFR 30-59 ml/min (HCC)   . Complete heart block, transient 2014  . Diastolic dysfunction, left ventricle   . DJD (degenerative joint disease)   . Emphysema lung (HCC)    2L N/C continuously  . Essential hypertension   . GERD (gastroesophageal reflux disease)   . Glaucoma   . Gout   . History of uterine cancer   . HOH (hard of hearing)   . Hyperlipidemia   . MI (myocardial infarction) ([place]) 1998  . Microcytic anemia    History of occult blood in stool  . Myalgia   . Osteoarthritis   . Palpitations   . Personal history of colonic polyps   . Sleep apnea    CPAP  . Type II diabetes mellitus with nephropathy [facility])     Patient Active Problem List   Diagnosis Date Noted  . Hypertensive crisis 11/08/2015  . Acute on chronic diastolic CHF (congestive heart failure) ([place]) 11/08/2015  . Anemia, unspecified 11/08/2015  . Syncope 08/19/2015  . Chest pain 08/19/2015  . Anemia 06/08/2015  . Hemorrhoids with complication  53/61/4431  . Chronic diastolic heart failure ([place]) 04/15/2015  . Essential hypertension 04/15/2015  . Chronic respiratory failure with hypoxia ([place]) 04/15/2015  . COPD with chronic bronchitis ([place]) 04/15/2015  . Near syncope 04/15/2015  . Type 2 diabetes mellitus with stage 4 chronic kidney disease, with long-term current use of insulin ([place]) 04/15/2015  . Dizziness and giddiness 04/14/2015  . Dizziness 04/14/2015  . Abnormal involuntary movements 04/12/2015  . Postmenopausal vaginal bleeding 01/18/2014  . Leg edema 08/15/2013  . Complete heart block, transiet 01/26/2013  . CKD (chronic kidney disease) stage 3, GFR 30-59 ml/min (HCC) 09/18/2012  . OBESITY 05/20/2009  . PERIPHERAL NEUROPATHY 05/20/2009  . NEVUS, ATYPICAL 05/06/2009  . HYPERTENSION, PULMONARY - partially due to LV diastolic dysfunction 54/00/8676  . BRONCHITIS, CHRONIC 12/16/2008  . DIASTOLIC DYSFUNCTION, normal LVF 04/30/2008  . Obstructive sleep apnea 04/30/2008  . Deficiency anemia 09/23/2007  . [place] DISEASE, LUMBOSACRAL SPINE 04/08/2007  . Mixed hyperlipidemia 10/29/2006  . Coronary atherosclerosis 10/29/2006  . ALLERGIC RHINITIS 10/29/2006  . GERD 10/29/2006  . CONSTIPATION, OTHER 10/29/2006  . OSTEOARTHRITIS 10/29/2006  . COLONIC POLYPS, HX OF 10/29/2006    Past Surgical History:  Procedure Laterality Date  . ABDOMINAL HYSTERECTOMY  04/2010   Uterine cancer,TAHBSO  . CERVICAL DISCECTOMY     L5 left/hemilminectomy  . COLONOSCOPY  09/2006   Int  hemmorhoids, COMPLICATED BY CARDIOPULMONARY COMPLICATIONS  . CORONARY ANGIOPLASTY  1/99, 1/07, 1/08, 4/09   5 cardiac stents total  . ECTOPIC PREGNANCY SURGERY    . FOOT SURGERY     Left and right for callous  . LEFT AND RIGHT HEART CATHETERIZATION WITH CORONARY ANGIOGRAM N/A 09/12/2012   Procedure: LEFT AND RIGHT HEART CATHETERIZATION WITH CORONARY ANGIOGRAM;  Surgeon: Sanda Klein, MD;  Location: [place] CATH LAB;  Service: Cardiovascular;   Laterality: N/A;  . POLYPECTOMY  10/22/2011   Internal hemorrhoids/sessile polyp    OB History    No data available       Home Medications    Prior to Admission medications   Medication Sig Start Date End Date Taking? Authorizing Provider  aspirin 81 MG EC tablet Take 81 mg by mouth every morning.    Yes [provider]  budesonide-formoterol (SYMBICORT) 160-4.5 MCG/ACT inhaler Inhale 2 puffs into the lungs 2 (two) times daily.   Yes [provider]  calcitRIOL (ROCALTROL) 0.25 MCG capsule Take 0.25 mcg by mouth daily. Reported on 03/30/2016 06/12/13  Yes [provider]  ergocalciferol (VITAMIN D2) 50000 units capsule Take 50,000 Units by mouth once a week.   Yes [provider]  Fluticasone-Salmeterol (ADVAIR DISKUS) 250-50 MCG/DOSE AEPB Inhale 1 puff into the lungs 2 (two) times daily. 11/10/15  Yes Samuella Cota, MD  Insulin Aspart Prot & Aspart (NOVOLOG MIX 70/30 FLEXPEN [place]) Inject 40-60 Units into the skin 2 (two) times daily with a meal.   Yes [provider]  nitroGLYCERIN (NITROSTAT) 0.4 MG SL tablet Place 1 tablet (0.4 mg total) under the tongue every 5 (five) minutes as needed for chest pain. 03/08/16  Yes [name], MD  polyethylene glycol (MIRALAX / GLYCOLAX) packet Take 17 g by mouth daily as needed for mild constipation. 11/10/15  Yes Samuella Cota, MD  sennosides-docusate sodium (SENOKOT-S) 8.6-50 MG tablet Take 1 tablet by mouth daily as needed for constipation.  04/05/17  Yes [provider]  ACCU-CHEK FASTCLIX LANCETS MISC USE AS DIRECTED TWICE DAILY. 12/20/17   Cassandria Anger, MD  ACCU-CHEK FASTCLIX LANCETS MISC USE AS DIRECTED TWICE DAILY. 12/20/17   Cassandria Anger, MD  BD PEN NEEDLE NANO U/F 32G X 4 MM MISC USE AS DIRECTED TWICE DAILY. 06/24/17   Cassandria Anger, MD  Continuous Blood Gluc Sensor (FREESTYLE LIBRE SENSOR SYSTEM) MISC Use one sensor every 10 days. 06/20/17   Cassandria Anger, MD  ezetimibe (ZETIA) 10 MG tablet TAKE 1 TABLET BY MOUTH EVERY MORNING. 10/18/17   [name], MD  famotidine (PEPCID) 20 MG tablet Take 1 tablet (20 mg total) by mouth daily. 08/23/15   Orvan Falconer, MD  glucose blood (ACCU-CHEK GUIDE) test strip Use as instructed 4 x daily 12/13/17   Cassandria Anger, MD  Insulin Pen Needle (B-D ULTRAFINE III SHORT PEN) 31G X 8 MM MISC 1 each by Does not apply route as directed. 02/23/16   Cassandria Anger, MD  isosorbide mononitrate (IMDUR) 60 MG 24 hr tablet TAKE (1) TABLET BY MOUTH ONCE DAILY. 10/15/17   [name], MD  lisinopril (PRINIVIL,ZESTRIL) 20 MG tablet TAKE (1) TABLET BY MOUTH ONCE DAILY. 10/18/17   [name], MD  meclizine (ANTIVERT) 25 MG tablet Take 25 mg by mouth 4 (four) times daily as needed for dizziness. 04/16/17   Sinda Du, MD  metoprolol tartrate (LOPRESSOR) 25 MG tablet Take 1 tablet (25 mg total) by  mouth 2 (two) times daily. 01/02/18   [name], MD  RANEXA 500 MG 12 hr tablet TAKE (2) TABLETS BY MOUTH TWICE DAILY. 10/15/17   [name], MD  ranolazine (RANEXA) 1000 MG SR tablet Take 1 tablet (1,000 mg total) by mouth 2 (two) times daily. 01/18/17   [name], MD  rivaroxaban (XARELTO) 2.5 MG TABS tablet Take 1 tablet (2.5 mg total) by mouth 2 (two) times daily. 01/02/18   [name], MD  rosuvastatin (CRESTOR) 20 MG tablet Take 1 tablet (20 mg total) by mouth every evening. 08/23/15   Orvan Falconer, MD  torsemide (DEMADEX) 20 MG tablet TAKE 1 TABLET BY MOUTH ONCE DAILY FOR SWELLING, MAY TAKE 1 EXTRA DAILY IF SWELLING WORSENS. 10/29/17   [name], MD  traMADol (ULTRAM) 50 MG tablet Take 1 tablet (50 mg total) by mouth every 6 (six) hours as needed for moderate pain. 01/21/18   Milton Ferguson, MD    Family History Family History  Problem Relation Age of Onset  . Anesthesia problems Neg Hx   . Hypotension Neg Hx   . Malignant  hyperthermia Neg Hx   . Pseudochol deficiency Neg Hx     Social History Social History   Tobacco Use  . Smoking status: Former Smoker    Packs/day: 0.40    Years: 51.00    Pack years: 20.40    Types: Cigarettes    Start date: 04/22/1955    Last attempt to quit: 10/18/2006    Years since quitting: 11.2  . Smokeless tobacco: Never Used  . Tobacco comment: quit about 4 yrs ago  Substance Use Topics  . Alcohol use: No    Alcohol/week: 0.0 oz  . Drug use: No     Allergies   Morphine; Penicillins; and Shellfish allergy   Review of Systems Review of Systems  Constitutional: Negative for appetite change and fatigue.  HENT: Negative for congestion, ear discharge and sinus pressure.   Eyes: Negative for discharge.  Respiratory: Negative for cough.   Cardiovascular: Negative for chest pain.  Gastrointestinal: Negative for abdominal pain and diarrhea.  Genitourinary: Negative for frequency and hematuria.  Musculoskeletal: Negative for back pain.  Skin: Negative for rash.  Neurological: Negative for seizures and headaches.       Headache  Psychiatric/Behavioral: Negative for hallucinations.     Physical Exam Updated Vital Signs BP (!) 192/76   Pulse 68   Temp 97.8 F (36.6 C) (Oral)   Resp 18   Ht 5\' 7"  (1.702 m)   Wt 83.9 kg (185 lb)   SpO2 95%   BMI 28.98 kg/m   Physical Exam  Constitutional: She is oriented to person, place, and time. She appears well-developed.  HENT:  Head: Normocephalic.  Eyes: Conjunctivae and EOM are normal. No scleral icterus.  Neck: Neck supple. No thyromegaly present.  Cardiovascular: Normal rate and regular rhythm. Exam reveals no gallop and no friction rub.  No murmur heard. Pulmonary/Chest: No stridor. She has no wheezes. She has no rales. She exhibits no tenderness.  Abdominal: She exhibits no distension. There is no tenderness. There is no rebound.  Musculoskeletal: Normal range of motion. She exhibits no edema.  Lymphadenopathy:     She has no cervical adenopathy.  Neurological: She is oriented to person, place, and time. She exhibits normal muscle tone. Coordination normal.  Skin: No rash noted. No erythema.  Psychiatric: She has a normal mood and affect. Her behavior is normal.  ED Treatments / Results  Labs (all labs ordered are listed, but only abnormal results are displayed) Labs Reviewed  CBC - Abnormal; Notable for the following components:      Result Value   RBC 5.17 (*)    Hemoglobin 11.9 (*)    MCV 75.8 (*)    MCH 23.0 (*)    All other components within normal limits  COMPREHENSIVE METABOLIC PANEL - Abnormal; Notable for the following components:   Chloride 97 (*)    Glucose, Bld 344 (*)    BUN 24 (*)    Creatinine, Ser 2.47 (*)    AST 13 (*)    ALT 11 (*)    GFR calc non Af Amer 17 (*)    GFR calc Af Amer 20 (*)    All other components within normal limits  TROPONIN I - Abnormal; Notable for the following components:   Troponin I 0.04 (*)    All other components within normal limits  CBG MONITORING, ED    EKG  EKG Interpretation None       Radiology Dg Chest 2 View  Result Date: 01/21/2018 CLINICAL DATA:  Lightheadedness EXAM: CHEST - 2 VIEW COMPARISON:  10/09/2016 FINDINGS: The heart size and mediastinal contours are within normal limits. Both lungs are clear. The visualized skeletal structures are unremarkable. IMPRESSION: No active cardiopulmonary disease. Electronically Signed   By: Ashley Royalty M.D.   On: 01/21/2018 19:02   Ct Head Wo Contrast  Result Date: 01/21/2018 CLINICAL DATA:  Altered level of consciousness. EXAM: CT HEAD WITHOUT CONTRAST TECHNIQUE: Contiguous axial images were obtained from the base of the skull through the vertex without intravenous contrast. COMPARISON:  08/19/2015 head CT. FINDINGS: Brain: No evidence of parenchymal hemorrhage or extra-axial fluid collection. No mass lesion, mass effect, or midline shift. No CT evidence of acute infarction.  Nonspecific moderate subcortical and periventricular white matter hypodensity, most in keeping with chronic small vessel ischemic change. Cerebral volume is age appropriate. No ventriculomegaly. Vascular: No acute abnormality. Skull: No evidence of calvarial fracture. Sinuses/Orbits: The visualized paranasal sinuses are essentially clear. Other:  The mastoid air cells are unopacified. IMPRESSION: 1.  No evidence of acute intracranial abnormality. 2. Moderate chronic small vessel ischemic changes in the cerebral white matter. Electronically Signed   By: Ilona Sorrel M.D.   On: 01/21/2018 19:04    Procedures Procedures (including critical care time)  Medications Ordered in ED Medications  HYDROcodone-acetaminophen (NORCO/VICODIN) 5-325 MG per tablet 1 tablet (not administered)  sodium chloride 0.9 % bolus 1,000 mL (1,000 mLs Intravenous New Bag/Given 01/21/18 1805)     Initial Impression / Assessment and Plan / ED Course  I have reviewed the triage vital signs and the nursing notes.  Pertinent labs & imaging results that were available during my care of the patient were reviewed by me and considered in my medical decision making (see chart for details).     Patient with dehydration poorly controlled diabetes.  Patient improved some with IV fluids and pain medicine.  She will follow-up with her PCP for better glucose control  Final Clinical Impressions(s) / ED Diagnoses   Final diagnoses:  Near syncope    ED Discharge Orders        Ordered    traMADol (ULTRAM) 50 MG tablet  Every 6 hours PRN     01/21/18 2126       Milton Ferguson, MD 01/21/18 2129

## 2018-01-21 NOTE — ED Triage Notes (Signed)
PT brought in by [facility]. PT states she was in [employer] shopping with a friend and started to feel light headed and sat down in the floor but unsure if she passed out. PT denies any pain and is alert and oriented upon arrival to ED.

## 2018-01-21 NOTE — ED Notes (Signed)
Pt ambulated to BR and back to room without difficultly  

## 2018-01-21 NOTE — ED Notes (Signed)
Date and time results received: 01/21/18 1905 (use smartphrase ".now" to insert current time)  Test: troponin Critical Value: 0.04  Name of Provider Notified: Dr. Roderic Palau notified at [facility]? Or Actions Taken?: no/na

## 2018-01-21 NOTE — Discharge Instructions (Signed)
Drink plenty of fluids.  Follow-up with your family doctor next week for recheck 

## 2018-01-22 ENCOUNTER — Telehealth: Payer: Self-pay

## 2018-01-22 LAB — CBG MONITORING, ED: Glucose-Capillary: 304 mg/dL — ABNORMAL HIGH (ref 65–99)

## 2018-01-22 MED ORDER — INSULIN ASPART PROT & ASPART (70-30 MIX) 100 UNIT/ML PEN: PEN_INJECTOR | SUBCUTANEOUS | 2 refills | Status: AC

## 2018-01-22 NOTE — Telephone Encounter (Signed)
Krystal [name] IS NEEDING A REFILL ON Insulin Aspart Prot & Aspart (NOVOLOG MIX 70/30 FLEXPEN [place] TO [facility]

## 2018-01-22 NOTE — Telephone Encounter (Signed)
rx sent

## 2018-02-05 ENCOUNTER — Other Ambulatory Visit: Payer: Self-pay | Admitting: Cardiovascular Disease

## 2018-02-26 ENCOUNTER — Other Ambulatory Visit: Payer: Self-pay | Admitting: *Deleted

## 2018-02-26 NOTE — Patient Outreach (Signed)
[facility]) Care Management  02/26/2018  Krystal [name] 1935-12-17 [id number]   Care coordination   CM received information about an upcoming [facility] [facility] on 03/22/18  Cm recalled [name] stating she was interested in dental services but unable to afford [name] agreed to have Cm give her name to the coordinator of this upcoming dental clinic, [employer] [name] was read the details of the dental clinic to include filings and extractions at this time. She was provided with [employer] contact information and inform [employer] reports she will make contact with [name] Mrs Labrum inquired about transportation resources She reports her recent ED visit to CM for  Passing out" She reports she is feeling better  Plans Cm called and discussed the information about this upcoming dental clinic with [name] provided [employer] with [name] contact information CM reminded [name] of the transportation information listed in the 2019 [facility] calendar  Route note to MDs  Joelene Millin L. Lavina Hamman, RN, BSN, [place] Coordinator [phone] week day mobile

## 2018-03-11 DIAGNOSIS — E1122 Type 2 diabetes mellitus with diabetic chronic kidney disease: Secondary | ICD-10-CM | POA: Diagnosis not present

## 2018-03-11 DIAGNOSIS — E1165 Type 2 diabetes mellitus with hyperglycemia: Secondary | ICD-10-CM | POA: Diagnosis not present

## 2018-03-11 DIAGNOSIS — N184 Chronic kidney disease, stage 4 (severe): Secondary | ICD-10-CM | POA: Diagnosis not present

## 2018-03-11 DIAGNOSIS — M184 Other bilateral secondary osteoarthritis of first carpometacarpal joints: Secondary | ICD-10-CM | POA: Diagnosis not present

## 2018-03-11 DIAGNOSIS — I110 Hypertensive heart disease with heart failure: Secondary | ICD-10-CM | POA: Diagnosis not present

## 2018-03-11 DIAGNOSIS — I10 Essential (primary) hypertension: Secondary | ICD-10-CM | POA: Diagnosis not present

## 2018-03-17 ENCOUNTER — Ambulatory Visit: Payer: Medicare Other | Admitting: "Endocrinology

## 2018-03-17 DIAGNOSIS — I10 Essential (primary) hypertension: Secondary | ICD-10-CM | POA: Diagnosis not present

## 2018-03-17 DIAGNOSIS — N184 Chronic kidney disease, stage 4 (severe): Secondary | ICD-10-CM | POA: Diagnosis not present

## 2018-03-17 DIAGNOSIS — Z794 Long term (current) use of insulin: Secondary | ICD-10-CM | POA: Diagnosis not present

## 2018-03-17 DIAGNOSIS — D509 Iron deficiency anemia, unspecified: Secondary | ICD-10-CM | POA: Diagnosis not present

## 2018-03-17 DIAGNOSIS — R809 Proteinuria, unspecified: Secondary | ICD-10-CM | POA: Diagnosis not present

## 2018-03-17 DIAGNOSIS — Z79899 Other long term (current) drug therapy: Secondary | ICD-10-CM | POA: Diagnosis not present

## 2018-03-17 DIAGNOSIS — N183 Chronic kidney disease, stage 3 (moderate): Secondary | ICD-10-CM | POA: Diagnosis not present

## 2018-03-17 DIAGNOSIS — E559 Vitamin D deficiency, unspecified: Secondary | ICD-10-CM | POA: Diagnosis not present

## 2018-03-17 DIAGNOSIS — E1122 Type 2 diabetes mellitus with diabetic chronic kidney disease: Secondary | ICD-10-CM | POA: Diagnosis not present

## 2018-03-17 LAB — COMPLETE METABOLIC PANEL WITH GFR
AG Ratio: 1.4 (calc) (ref 1.0–2.5)
ALBUMIN MSPROF: 3.7 g/dL (ref 3.6–5.1)
ALKALINE PHOSPHATASE (APISO): 56 U/L (ref 33–130)
ALT: 21 U/L (ref 6–29)
AST: 22 U/L (ref 10–35)
BUN / CREAT RATIO: 13 (calc) (ref 6–22)
BUN: 30 mg/dL — ABNORMAL HIGH (ref 7–25)
CALCIUM: 9.3 mg/dL (ref 8.6–10.4)
CO2: 35 mmol/L — AB (ref 20–32)
CREATININE: 2.4 mg/dL — AB (ref 0.60–0.93)
Chloride: 98 mmol/L (ref 98–110)
GFR, EST AFRICAN AMERICAN: 22 mL/min/{1.73_m2} — AB (ref >=60)
GFR, EST NON AFRICAN AMERICAN: 19 mL/min/{1.73_m2} — AB (ref >=60)
GLOBULIN: 2.7 g/dL (ref 1.9–3.7)
GLUCOSE: 246 mg/dL — AB (ref 65–139)
Potassium: 4.3 mmol/L (ref 3.5–5.3)
Sodium: 139 mmol/L (ref 135–146)
TOTAL PROTEIN: 6.4 g/dL (ref 6.1–8.1)
Total Bilirubin: 0.4 mg/dL (ref 0.2–1.2)

## 2018-03-18 ENCOUNTER — Emergency Department (HOSPITAL_COMMUNITY)
Admission: EM | Admit: 2018-03-18 | Discharge: 2018-03-18 | Disposition: A | Discharge status: Home / Self Care | Payer: Medicare Other | Attending: Emergency Medicine | Admitting: Emergency Medicine

## 2018-03-18 ENCOUNTER — Encounter (HOSPITAL_COMMUNITY): Payer: Self-pay | Admitting: Emergency Medicine

## 2018-03-18 ENCOUNTER — Other Ambulatory Visit: Payer: Self-pay

## 2018-03-18 DIAGNOSIS — S0081XA Abrasion of other part of head, initial encounter: Secondary | ICD-10-CM | POA: Insufficient documentation

## 2018-03-18 DIAGNOSIS — Z5321 Procedure and treatment not carried out due to patient leaving prior to being seen by health care provider: Secondary | ICD-10-CM | POA: Insufficient documentation

## 2018-03-18 DIAGNOSIS — Y939 Activity, unspecified: Secondary | ICD-10-CM | POA: Insufficient documentation

## 2018-03-18 DIAGNOSIS — Y998 Other external cause status: Secondary | ICD-10-CM | POA: Diagnosis not present

## 2018-03-18 DIAGNOSIS — Y929 Unspecified place or not applicable: Secondary | ICD-10-CM | POA: Insufficient documentation

## 2018-03-18 DIAGNOSIS — W06XXXA Fall from bed, initial encounter: Secondary | ICD-10-CM | POA: Insufficient documentation

## 2018-03-18 LAB — TSH: TSH: 2.67 m[IU]/L (ref 0.40–4.50)

## 2018-03-18 LAB — HEMOGLOBIN A1C
HEMOGLOBIN A1C: 11.2 %{Hb} — AB (ref <5.7)
Mean Plasma Glucose: 275 (calc)
eAG (mmol/L): 15.2 (calc)

## 2018-03-18 LAB — T4, FREE: FREE T4: 1.2 ng/dL (ref 0.8–1.8)

## 2018-03-18 NOTE — ED Notes (Signed)
Called for room with no response 

## 2018-03-18 NOTE — ED Triage Notes (Addendum)
Pt fell while getting out of bed. States she thinks she hit the door. Pt with an abrasion to LT forehead and c/o back pain since fall. States she does take blood thinners. Denies LOC.

## 2018-03-19 ENCOUNTER — Other Ambulatory Visit (HOSPITAL_COMMUNITY): Payer: Self-pay | Admitting: Pulmonary Disease

## 2018-03-19 DIAGNOSIS — I10 Essential (primary) hypertension: Secondary | ICD-10-CM | POA: Diagnosis not present

## 2018-03-19 DIAGNOSIS — J449 Chronic obstructive pulmonary disease, unspecified: Secondary | ICD-10-CM | POA: Diagnosis not present

## 2018-03-19 DIAGNOSIS — R55 Syncope and collapse: Secondary | ICD-10-CM

## 2018-03-19 DIAGNOSIS — E1165 Type 2 diabetes mellitus with hyperglycemia: Secondary | ICD-10-CM | POA: Diagnosis not present

## 2018-03-21 ENCOUNTER — Other Ambulatory Visit: Payer: Self-pay | Admitting: "Endocrinology

## 2018-03-25 ENCOUNTER — Encounter: Payer: Self-pay | Admitting: Cardiovascular Disease

## 2018-03-25 ENCOUNTER — Ambulatory Visit (INDEPENDENT_AMBULATORY_CARE_PROVIDER_SITE_OTHER): Payer: Medicare Other | Admitting: Cardiovascular Disease

## 2018-03-25 VITALS — BP 130/64 | HR 86 | Ht 67.0 in | Wt 172.0 lb

## 2018-03-25 DIAGNOSIS — I25118 Atherosclerotic heart disease of native coronary artery with other forms of angina pectoris: Secondary | ICD-10-CM | POA: Diagnosis not present

## 2018-03-25 DIAGNOSIS — I10 Essential (primary) hypertension: Secondary | ICD-10-CM | POA: Diagnosis not present

## 2018-03-25 DIAGNOSIS — G4733 Obstructive sleep apnea (adult) (pediatric): Secondary | ICD-10-CM

## 2018-03-25 DIAGNOSIS — I5032 Chronic diastolic (congestive) heart failure: Secondary | ICD-10-CM

## 2018-03-25 DIAGNOSIS — E785 Hyperlipidemia, unspecified: Secondary | ICD-10-CM

## 2018-03-25 NOTE — Patient Instructions (Addendum)
Your physician wants you to follow-up in: 4 months  with Bernerd Pho PA-C     Your physician recommends that you continue on your current medications as directed. Please refer to the Current Medication list given to you today.     If you need a refill on your cardiac medications before your next appointment, please call your pharmacy.      No lab work or tests ordered today.              Thank you for choosing [facility] !

## 2018-03-25 NOTE — Progress Notes (Signed)
SUBJECTIVE: The patient presents for follow-up of angina pectoris. In summary, she has a history of chronic diastolic heart failure, hypertension, coronary artery disease with multiple previously placed stents, COPD and is on chronic oxygen at home (2L), sleep apnea and uses CPAP, hyperlipidemia, and insulin-dependent diabetes mellitus.   Echocardiogram on 04/15/2015 demonstrated vigorous left ventricular systolic function, EF 47-42%, severe LVH, grade 1 diastolic dysfunction, elevated filling pressures, trivial mitral regurgitation, trivial tricuspid regurgitation, and normal pulmonary pressures of 27 mmHg.   As per Dr. Evette Georges note on 02/14/14, she has coronary artery disease dating back to the 1990s and has undergone multiple stent procedures involving the proximal LAD, circumflex, mid right coronary artery, and distal right coronary artery. Her last catheterization in October 2013 did not reveal high-grade restenosis. She did have 80-90% ostial stenosis and a small jailed first septal perforating artery. She had mild calcification and 20-30% narrowing in the stent in the circumflex vessel. The right coronary artery had 3 stents and there was 40-50% in-stent smooth narrowing with 30% narrowing in the distal stent.  Since starting her on metoprolol at her last visit, chest pains have resolved.  She has some separate left inframammary pains lasting seconds and are exacerbated with turning in different positions in the bed.  She tells me about a week ago she got up out of bed and tripped over the oxygen cord and fell and hit her head on the floor.  She saw Dr. Luan Pulling who ordered a CT scan of her head which is to be performed on May 10.  She does have a headache but denies blurry vision.  She was also started on meclizine as needed by her PCP.     Review of Systems: As per "subjective", otherwise negative.  Allergies  Allergen Reactions  . Morphine Shortness Of Breath and Swelling  .  Penicillins Shortness Of Breath and Swelling    Has patient had a PCN reaction causing immediate rash, facial/tongue/throat swelling, SOB or lightheadedness with hypotension: No Has patient had a PCN reaction causing severe rash involving mucus membranes or skin necrosis: No Has patient had a PCN reaction that required hospitalization Yes Has patient had a PCN reaction occurring within the last 10 years: No If all of the above answers are "NO", then may proceed with Cephalosporin use.   . Shellfish Allergy     Current Outpatient Medications  Medication Sig Dispense Refill  . ACCU-CHEK FASTCLIX LANCETS MISC USE AS DIRECTED TWICE DAILY. 102 each 11  . aspirin 81 MG EC tablet Take 81 mg by mouth every morning.     . BD PEN NEEDLE NANO U/F 32G X 4 MM MISC USE AS DIRECTED TWICE DAILY. 60 each 10  . budesonide-formoterol (SYMBICORT) 160-4.5 MCG/ACT inhaler Inhale 2 puffs into the lungs 2 (two) times daily.    . calcitRIOL (ROCALTROL) 0.25 MCG capsule TAKE 1 CAPSULE BY MOUTH ONCE A DAY. 30 capsule 11  . Continuous Blood Gluc Sensor (FREESTYLE LIBRE SENSOR SYSTEM) MISC Use one sensor every 10 days. 3 each 2  . ergocalciferol (VITAMIN D2) 50000 units capsule Take 50,000 Units by mouth once a week.    . ezetimibe (ZETIA) 10 MG tablet TAKE 1 TABLET BY MOUTH EVERY MORNING. 30 tablet 6  . famotidine (PEPCID) 20 MG tablet Take 1 tablet (20 mg total) by mouth daily. 30 tablet 1  . Fluticasone-Salmeterol (ADVAIR DISKUS) 250-50 MCG/DOSE AEPB Inhale 1 puff into the lungs 2 (two) times daily. 60 each 1  .  glucose blood (ACCU-CHEK GUIDE) test strip Use as instructed 4 x daily 150 each 2  . insulin aspart protamine - aspart (NOVOLOG MIX 70/30 FLEXPEN) (70-30) 100 UNIT/ML FlexPen 60 units in the a.m. And 40 units at night 30 mL 2  . Insulin Pen Needle (B-D ULTRAFINE III SHORT PEN) 31G X 8 MM MISC 1 each by Does not apply route as directed. 100 each 3  . isosorbide mononitrate (IMDUR) 60 MG 24 hr tablet TAKE (1)  TABLET BY MOUTH ONCE DAILY. 28 tablet 11  . lisinopril (PRINIVIL,ZESTRIL) 20 MG tablet TAKE (1) TABLET BY MOUTH ONCE DAILY. 30 tablet 6  . meclizine (ANTIVERT) 25 MG tablet Take 25 mg by mouth 4 (four) times daily as needed for dizziness.    . metoprolol tartrate (LOPRESSOR) 25 MG tablet Take 1 tablet (25 mg total) by mouth 2 (two) times daily. 180 tablet 3  . nitroGLYCERIN (NITROSTAT) 0.4 MG SL tablet Place 1 tablet (0.4 mg total) under the tongue every 5 (five) minutes as needed for chest pain. 25 tablet 3  . ONETOUCH DELICA LANCETS 03J MISC USE AS DIRECTED THREE TIMES DAILY. 100 each 5  . polyethylene glycol (MIRALAX / GLYCOLAX) packet Take 17 g by mouth daily as needed for mild constipation. 30 each 0  . RANEXA 500 MG 12 hr tablet TAKE (2) TABLETS BY MOUTH TWICE DAILY. 64 tablet 11  . ranolazine (RANEXA) 1000 MG SR tablet Take 1 tablet (1,000 mg total) by mouth 2 (two) times daily. 60 tablet 6  . rivaroxaban (XARELTO) 2.5 MG TABS tablet Take 1 tablet (2.5 mg total) by mouth 2 (two) times daily. 60 tablet 6  . rosuvastatin (CRESTOR) 20 MG tablet Take 1 tablet (20 mg total) by mouth every evening. 30 tablet 1  . sennosides-docusate sodium (SENOKOT-S) 8.6-50 MG tablet Take 1 tablet by mouth daily as needed for constipation.     . torsemide (DEMADEX) 20 MG tablet TAKE 1 TABLET BY MOUTH ONCE DAILY FOR SWELLING, MAY TAKE 1 EXTRA DAILY IF SWELLING WORSENS. 60 tablet 10  . traMADol (ULTRAM) 50 MG tablet Take 1 tablet (50 mg total) by mouth every 6 (six) hours as needed for moderate pain. 10 tablet 0   No current facility-administered medications for this visit.     Past Medical History:  Diagnosis Date  . Allergic rhinitis   . CAD (coronary artery disease) 2009   5 stents- #3 in RCA, #1 each in LAD and AVG  . CHF (congestive heart failure) ([place])   . CKD (chronic kidney disease) stage 3, GFR 30-59 ml/min (HCC)   . Complete heart block, transient 2014  . Diastolic dysfunction, left ventricle   .  DJD (degenerative joint disease)   . Emphysema lung (HCC)    2L N/C continuously  . Essential hypertension   . GERD (gastroesophageal reflux disease)   . Glaucoma   . Gout   . History of uterine cancer   . HOH (hard of hearing)   . Hyperlipidemia   . MI (myocardial infarction) ([place]) 1998  . Microcytic anemia    History of occult blood in stool  . Myalgia   . Osteoarthritis   . Palpitations   . Personal history of colonic polyps   . Sleep apnea    CPAP  . Type II diabetes mellitus with nephropathy [facility])     Past Surgical History:  Procedure Laterality Date  . ABDOMINAL HYSTERECTOMY  04/2010   Uterine cancer,TAHBSO  . CERVICAL DISCECTOMY  L5 left/hemilminectomy  . COLONOSCOPY  09/2006   Int hemmorhoids, COMPLICATED BY CARDIOPULMONARY COMPLICATIONS  . CORONARY ANGIOPLASTY  1/99, 1/07, 1/08, 4/09   5 cardiac stents total  . ECTOPIC PREGNANCY SURGERY    . FOOT SURGERY     Left and right for callous  . LEFT AND RIGHT HEART CATHETERIZATION WITH CORONARY ANGIOGRAM N/A 09/12/2012   Procedure: LEFT AND RIGHT HEART CATHETERIZATION WITH CORONARY ANGIOGRAM;  Surgeon: Sanda Klein, MD;  Location: [facility] CATH [facility];  Service: Cardiovascular;  Laterality: N/A;  . POLYPECTOMY  10/22/2011   Internal hemorrhoids/sessile polyp    Social History   Socioeconomic History  . Marital status: Widowed    Spouse name: Not on file  . Number of children: 0  . Years of education: 38  . Highest education level: Not on file  Occupational History    Comment: retired  Scientific laboratory technician  . Financial resource strain: Not on file  . Food insecurity:    Worry: Not on file    Inability: Not on file  . Transportation needs:    Medical: Not on file    Non-medical: Not on file  Tobacco Use  . Smoking status: Former Smoker    Packs/day: 0.40    Years: 51.00    Pack years: 20.40    Types: Cigarettes    Start date: 04/22/1955    Last attempt to quit: 10/18/2006    Years since quitting: 11.4  . Smokeless  tobacco: Never Used  . Tobacco comment: quit about 4 yrs ago  Substance and Sexual Activity  . Alcohol use: No    Alcohol/week: 0.0 oz  . Drug use: No  . Sexual activity: Never    Birth control/protection: Surgical  Lifestyle  . Physical activity:    Days per week: Not on file    Minutes per session: Not on file  . Stress: Not on file  Relationships  . Social connections:    Talks on phone: Not on file    Gets together: Not on file    Attends religious service: Not on file    Active member of club or organization: Not on file    Attends meetings of clubs or organizations: Not on file    Relationship status: Not on file  . Intimate partner violence:    Fear of current or ex partner: Not on file    Emotionally abused: Not on file    Physically abused: Not on file    Forced sexual activity: Not on file  Other Topics Concern  . Not on file  Social History Narrative   Consumes 2 cups of caffeine daily     Vitals:   03/25/18 1340  BP: 130/64  Pulse: 86  SpO2: 95%  Weight: 172 lb (78 kg)  Height: 5\' 7"  (1.702 m)    Wt Readings from Last 3 Encounters:  03/25/18 172 lb (78 kg)  03/18/18 185 lb (83.9 kg)  01/21/18 185 lb (83.9 kg)     PHYSICAL EXAM General: NAD HEENT: Normal. Neck: No JVD, no thyromegaly. Lungs: Diminished throughout, dry crackles at bases.  No wheezes. CV: Regular rate and rhythm, normal S1/S2, no S3/S4, no murmur. No pretibial or periankle edema.     Abdomen: Soft, nontender, no distention.  Neurologic: Alert and oriented.  Psych: Normal affect. Skin: Normal. Musculoskeletal: No gross deformities.    ECG: Most recent ECG reviewed.   Labs: Lab Results  Component Value Date/Time   K 4.3 03/17/2018 11:02 AM  BUN 30 (H) 03/17/2018 11:02 AM   BUN 44 (H) 11/01/2014 03:09 PM   CREATININE 2.40 (H) 03/17/2018 11:02 AM   ALT 21 03/17/2018 11:02 AM   TSH 2.67 03/17/2018 11:02 AM   HGB 11.9 (L) 01/21/2018 05:58 PM     Lipids: Lab Results    Component Value Date/Time   LDLCALC 71 05/07/2016 10:06 AM   LDLDIRECT 118.4 08/13/2013 03:02 PM   CHOL 151 05/07/2016 10:06 AM   TRIG 217 (H) 05/07/2016 10:06 AM   HDL 37 (L) 05/07/2016 10:06 AM       ASSESSMENT AND PLAN:  1. Chronic diastolic heart failure: She is currently euvolemic and stable. Blood pressure is controlled.Continue torsemide 20 mg. Instructed to take extra 20 mg for increased leg swelling.  2. CAD with angina:  Symptomatically improved with addition of metoprolol tartrate 25 mg twice daily.  Continue Ranexa 1000 mg twice daily along with aspirin, Zetia, Imdur, and Crestor. She underwent a low risk normal nuclear stress test in September 2014.  I will continue Xarelto 2.5 mg twice daily (new coronary artery disease dosing).  3. Essential hypertension: Controlled.    No changes to therapy.  4. Hyperlipidemia: Continue current therapy with Crestor and Zetia.  5. Mild OSA.     Disposition: Follow up months   Kate Sable, M.D., F.A.C.C.

## 2018-03-28 ENCOUNTER — Ambulatory Visit (HOSPITAL_COMMUNITY)
Admission: RE | Admit: 2018-03-28 | Discharge: 2018-03-28 | Disposition: A | Discharge status: Home / Self Care | Payer: Medicare Other | Source: Ambulatory Visit | Attending: Pulmonary Disease | Admitting: Pulmonary Disease

## 2018-03-28 DIAGNOSIS — R55 Syncope and collapse: Secondary | ICD-10-CM

## 2018-04-11 ENCOUNTER — Encounter: Payer: Self-pay | Admitting: "Endocrinology

## 2018-04-11 ENCOUNTER — Emergency Department (HOSPITAL_COMMUNITY): Payer: Medicare Other

## 2018-04-11 ENCOUNTER — Encounter (HOSPITAL_COMMUNITY): Payer: Self-pay | Admitting: Emergency Medicine

## 2018-04-11 ENCOUNTER — Other Ambulatory Visit: Payer: Self-pay

## 2018-04-11 ENCOUNTER — Ambulatory Visit (INDEPENDENT_AMBULATORY_CARE_PROVIDER_SITE_OTHER): Payer: Medicare Other | Admitting: "Endocrinology

## 2018-04-11 ENCOUNTER — Emergency Department (HOSPITAL_COMMUNITY)
Admission: EM | Admit: 2018-04-11 | Discharge: 2018-04-11 | Disposition: A | Discharge status: Home / Self Care | Payer: Medicare Other | Attending: Emergency Medicine | Admitting: Emergency Medicine

## 2018-04-11 VITALS — BP 183/95 | HR 80 | Ht 67.0 in | Wt 171.2 lb

## 2018-04-11 DIAGNOSIS — I5032 Chronic diastolic (congestive) heart failure: Secondary | ICD-10-CM | POA: Insufficient documentation

## 2018-04-11 DIAGNOSIS — I130 Hypertensive heart and chronic kidney disease with heart failure and stage 1 through stage 4 chronic kidney disease, or unspecified chronic kidney disease: Secondary | ICD-10-CM | POA: Diagnosis not present

## 2018-04-11 DIAGNOSIS — I10 Essential (primary) hypertension: Secondary | ICD-10-CM | POA: Diagnosis not present

## 2018-04-11 DIAGNOSIS — Z8541 Personal history of malignant neoplasm of cervix uteri: Secondary | ICD-10-CM | POA: Insufficient documentation

## 2018-04-11 DIAGNOSIS — E1121 Type 2 diabetes mellitus with diabetic nephropathy: Secondary | ICD-10-CM | POA: Diagnosis not present

## 2018-04-11 DIAGNOSIS — I2510 Atherosclerotic heart disease of native coronary artery without angina pectoris: Secondary | ICD-10-CM | POA: Insufficient documentation

## 2018-04-11 DIAGNOSIS — Z794 Long term (current) use of insulin: Secondary | ICD-10-CM | POA: Insufficient documentation

## 2018-04-11 DIAGNOSIS — N184 Chronic kidney disease, stage 4 (severe): Secondary | ICD-10-CM | POA: Diagnosis not present

## 2018-04-11 DIAGNOSIS — I25118 Atherosclerotic heart disease of native coronary artery with other forms of angina pectoris: Secondary | ICD-10-CM

## 2018-04-11 DIAGNOSIS — E1122 Type 2 diabetes mellitus with diabetic chronic kidney disease: Secondary | ICD-10-CM | POA: Insufficient documentation

## 2018-04-11 DIAGNOSIS — Z87891 Personal history of nicotine dependence: Secondary | ICD-10-CM | POA: Insufficient documentation

## 2018-04-11 DIAGNOSIS — N390 Urinary tract infection, site not specified: Secondary | ICD-10-CM | POA: Diagnosis not present

## 2018-04-11 DIAGNOSIS — J449 Chronic obstructive pulmonary disease, unspecified: Secondary | ICD-10-CM | POA: Insufficient documentation

## 2018-04-11 DIAGNOSIS — R410 Disorientation, unspecified: Secondary | ICD-10-CM | POA: Diagnosis not present

## 2018-04-11 DIAGNOSIS — E876 Hypokalemia: Secondary | ICD-10-CM | POA: Diagnosis not present

## 2018-04-11 LAB — COMPREHENSIVE METABOLIC PANEL
ALT: 23 U/L (ref 14–54)
ANION GAP: 8 (ref 5–15)
AST: 39 U/L (ref 15–41)
Albumin: 3.3 g/dL — ABNORMAL LOW (ref 3.5–5.0)
Alkaline Phosphatase: 51 U/L (ref 38–126)
BILIRUBIN TOTAL: 0.6 mg/dL (ref 0.3–1.2)
BUN: 26 mg/dL — ABNORMAL HIGH (ref 6–20)
CO2: 33 mmol/L — ABNORMAL HIGH (ref 22–32)
Calcium: 9.2 mg/dL (ref 8.9–10.3)
Chloride: 95 mmol/L — ABNORMAL LOW (ref 101–111)
Creatinine, Ser: 2.21 mg/dL — ABNORMAL HIGH (ref 0.44–1.00)
GFR calc non Af Amer: 20 mL/min — ABNORMAL LOW (ref >60)
GFR, EST AFRICAN AMERICAN: 23 mL/min — AB (ref >60)
Glucose, Bld: 101 mg/dL — ABNORMAL HIGH (ref 65–99)
Potassium: 2.8 mmol/L — ABNORMAL LOW (ref 3.5–5.1)
Sodium: 136 mmol/L (ref 135–145)
TOTAL PROTEIN: 6.6 g/dL (ref 6.5–8.1)

## 2018-04-11 LAB — URINALYSIS, COMPLETE (UACMP) WITH MICROSCOPIC
Bilirubin Urine: NEGATIVE
Ketones, ur: NEGATIVE mg/dL
LEUKOCYTES UA: NEGATIVE
NITRITE: NEGATIVE
PH: 5 (ref 5.0–8.0)
PROTEIN: 100 mg/dL — AB
SPECIFIC GRAVITY, URINE: 1.01 (ref 1.005–1.030)

## 2018-04-11 LAB — CBC WITH DIFFERENTIAL/PLATELET
BASOS ABS: 0 10*3/uL (ref 0.0–0.1)
BASOS PCT: 0 %
EOS ABS: 0.2 10*3/uL (ref 0.0–0.7)
Eosinophils Relative: 2 %
HEMATOCRIT: 36.4 % (ref 36.0–46.0)
Hemoglobin: 11.5 g/dL — ABNORMAL LOW (ref 12.0–15.0)
Lymphocytes Relative: 35 %
Lymphs Abs: 2.6 10*3/uL (ref 0.7–4.0)
MCH: 23.8 pg — ABNORMAL LOW (ref 26.0–34.0)
MCHC: 31.6 g/dL (ref 30.0–36.0)
MCV: 75.2 fL — ABNORMAL LOW (ref 78.0–100.0)
MONO ABS: 0.7 10*3/uL (ref 0.1–1.0)
Monocytes Relative: 9 %
NEUTROS ABS: 4 10*3/uL (ref 1.7–7.7)
NEUTROS PCT: 54 %
Platelets: 222 10*3/uL (ref 150–400)
RBC: 4.84 MIL/uL (ref 3.87–5.11)
RDW: 15.5 % (ref 11.5–15.5)
WBC: 7.5 10*3/uL (ref 4.0–10.5)

## 2018-04-11 LAB — CBG MONITORING, ED: Glucose-Capillary: 95 mg/dL (ref 65–99)

## 2018-04-11 MED ORDER — NITROFURANTOIN MONOHYD MACRO 100 MG PO CAPS
100.0000 mg | ORAL_CAPSULE | Freq: Once | ORAL | Status: AC
  Administered 2018-04-11: 100 mg via ORAL
  Filled 2018-04-11: qty 1

## 2018-04-11 MED ORDER — POTASSIUM CHLORIDE CRYS ER 20 MEQ PO TBCR
40.0000 meq | EXTENDED_RELEASE_TABLET | Freq: Once | ORAL | Status: AC
  Administered 2018-04-11: 40 meq via ORAL
  Filled 2018-04-11: qty 2

## 2018-04-11 MED ORDER — NITROFURANTOIN MONOHYD MACRO 100 MG PO CAPS: 100.0000 mg | ORAL_CAPSULE | Freq: Two times a day (BID) | ORAL | 0 refills | Status: DC

## 2018-04-11 MED ORDER — METOPROLOL TARTRATE 25 MG PO TABS
25.0000 mg | ORAL_TABLET | Freq: Once | ORAL | Status: AC
  Administered 2018-04-11: 25 mg via ORAL
  Filled 2018-04-11: qty 1

## 2018-04-11 MED ORDER — POTASSIUM CHLORIDE CRYS ER 20 MEQ PO TBCR: 20.0000 meq | EXTENDED_RELEASE_TABLET | Freq: Two times a day (BID) | ORAL | 0 refills | Status: DC

## 2018-04-11 MED ORDER — POTASSIUM CHLORIDE 10 MEQ/100ML IV SOLN
10.0000 meq | Freq: Once | INTRAVENOUS | Status: AC
  Administered 2018-04-11: 10 meq via INTRAVENOUS
  Filled 2018-04-11: qty 100

## 2018-04-11 MED ORDER — LISINOPRIL 10 MG PO TABS
20.0000 mg | ORAL_TABLET | Freq: Once | ORAL | Status: AC
  Administered 2018-04-11: 20 mg via ORAL
  Filled 2018-04-11: qty 2

## 2018-04-11 NOTE — Patient Instructions (Signed)
To ER for better evaluation and treatment.

## 2018-04-11 NOTE — ED Provider Notes (Signed)
Emergency Department Provider Note   I have reviewed the triage vital signs and the nursing notes.   HISTORY  Chief Complaint Hypertension   HPI Krystal [name] is a 82 y.o. female who is here from her primary care office secondary to blood pressure and confusion.  Seem to be confused at this point.  She is alert head and gives a history of hitting her head a few weeks back and since then she has had some episodes of headache and confusion.  According to the primary care note the patient has uncontrolled diabetes at this point as well.  Patient denies any fevers does endorse polyuria polydipsia.  Does not endorse any back pain or abdominal pain.  No chest pain.  No other associated symptoms. The nurse aide that accompanied her said that she did seem to be more confused this morning but seems to be closer to baseline now.   Past Medical History:  Diagnosis Date  . Allergic rhinitis   . CAD (coronary artery disease) 2009   5 stents- #3 in RCA, #1 each in LAD and AVG  . CHF (congestive heart failure) ([place])   . CKD (chronic kidney disease) stage 3, GFR 30-59 ml/min (HCC)   . Complete heart block, transient 2014  . Diastolic dysfunction, left ventricle   . DJD (degenerative joint disease)   . Emphysema lung (HCC)    2L N/C continuously  . Essential hypertension   . GERD (gastroesophageal reflux disease)   . Glaucoma   . Gout   . History of uterine cancer   . HOH (hard of hearing)   . Hyperlipidemia   . MI (myocardial infarction) ([place]) 1998  . Microcytic anemia    History of occult blood in stool  . Myalgia   . Osteoarthritis   . Palpitations   . Personal history of colonic polyps   . Sleep apnea    CPAP  . Type II diabetes mellitus with nephropathy [facility])     Patient Active Problem List   Diagnosis Date Noted  . Hypertensive crisis 11/08/2015  . Acute on chronic diastolic CHF (congestive heart failure) ([place]) 11/08/2015  . Anemia, unspecified 11/08/2015  . Syncope  08/19/2015  . Chest pain 08/19/2015  . Anemia 06/08/2015  . Hemorrhoids with complication 76/73/4193  . Chronic diastolic heart failure ([place]) 04/15/2015  . Essential hypertension 04/15/2015  . Chronic respiratory failure with hypoxia ([place]) 04/15/2015  . COPD with chronic bronchitis ([place]) 04/15/2015  . Near syncope 04/15/2015  . Type 2 diabetes mellitus with stage 4 chronic kidney disease, with long-term current use of insulin ([place]) 04/15/2015  . Dizziness and giddiness 04/14/2015  . Dizziness 04/14/2015  . Abnormal involuntary movements 04/12/2015  . Postmenopausal vaginal bleeding 01/18/2014  . Leg edema 08/15/2013  . Complete heart block, transiet 01/26/2013  . CKD (chronic kidney disease) stage 3, GFR 30-59 ml/min (HCC) 09/18/2012  . OBESITY 05/20/2009  . PERIPHERAL NEUROPATHY 05/20/2009  . NEVUS, ATYPICAL 05/06/2009  . HYPERTENSION, PULMONARY - partially due to LV diastolic dysfunction 79/12/4095  . BRONCHITIS, CHRONIC 12/16/2008  . DIASTOLIC DYSFUNCTION, normal LVF 04/30/2008  . Obstructive sleep apnea 04/30/2008  . Deficiency anemia 09/23/2007  . [place] DISEASE, LUMBOSACRAL SPINE 04/08/2007  . Mixed hyperlipidemia 10/29/2006  . Coronary atherosclerosis 10/29/2006  . ALLERGIC RHINITIS 10/29/2006  . GERD 10/29/2006  . CONSTIPATION, OTHER 10/29/2006  . OSTEOARTHRITIS 10/29/2006  . COLONIC POLYPS, HX OF 10/29/2006    Past Surgical History:  Procedure Laterality Date  . ABDOMINAL  HYSTERECTOMY  04/2010   Uterine cancer,TAHBSO  . CERVICAL DISCECTOMY     L5 left/hemilminectomy  . COLONOSCOPY  09/2006   Int hemmorhoids, COMPLICATED BY CARDIOPULMONARY COMPLICATIONS  . CORONARY ANGIOPLASTY  1/99, 1/07, 1/08, 4/09   5 cardiac stents total  . ECTOPIC PREGNANCY SURGERY    . FOOT SURGERY     Left and right for callous  . LEFT AND RIGHT HEART CATHETERIZATION WITH CORONARY ANGIOGRAM N/A 09/12/2012   Procedure: LEFT AND RIGHT HEART CATHETERIZATION WITH CORONARY  ANGIOGRAM;  Surgeon: Sanda Klein, MD;  Location: [facility];  Service: Cardiovascular;  Laterality: N/A;  . POLYPECTOMY  10/22/2011   Internal hemorrhoids/sessile polyp    Current Outpatient Rx  . Order #: [id number] Class: Normal  . Order #: [id number] Class: Historical Med  . Order #: [id number] Class: Normal  . Order #: [id number] Class: Historical Med  . Order #: [id number] Class: Normal  . Order #: [id number] Class: Normal  . Order #: [id number] Class: Historical Med  . Order #: [id number] Class: Normal  . Order #: [id number] Class: Normal  . Order #: [id number] Class: Normal  . Order #: [id number] Class: Normal  . Order #: [id number] Class: Normal  . Order #: [id number] Class: Normal  . Order #: [id number] Class: Normal  . Order #: [id number] Class: Normal  . Order #: [id number] Class: Historical Med  . Order #: [id number] Class: Historical Med  . Order #: [id number] Class: Normal  . Order #: [id number] Class: Normal  . Order #: [id number] Class: Normal  . Order #: [id number] Class: Normal  . Order #: [id number] Class: Normal  . Order #: [id number] Class: Normal  . Order #: [id number] Class: Normal  . Order #: [id number] Class: Historical Med  . Order #: [id number] Class: Normal  . Order #: [id number] Class: Print  . Order #: [id number] Class: Print  . Order #: [id number] Class: Print    Allergies Morphine; Penicillins; and Shellfish allergy  Family History  Problem Relation Age of Onset  . Anesthesia problems Neg Hx   . Hypotension Neg Hx   . Malignant hyperthermia Neg Hx   . Pseudochol deficiency Neg Hx     Social History Social History   Tobacco Use  . Smoking status: Former Smoker    Packs/day: 0.40    Years: 51.00    Pack years: 20.40    Types: Cigarettes    Start date: 04/22/1955    Last attempt to quit: 10/18/2006    Years since quitting: 11.4  . Smokeless tobacco: Never Used  . Tobacco comment: quit about 4 yrs ago  Substance Use Topics  . Alcohol use: No    Alcohol/week: 0.0 oz  . Drug use: No      Review of Systems  All other systems negative except as documented in the HPI. All pertinent positives and negatives as reviewed in the HPI. ____________________________________________   PHYSICAL EXAM:  VITAL SIGNS: ED Triage Vitals  Enc Vitals Group     BP 04/11/18 1137 (!) 195/99     Pulse Rate 04/11/18 1137 80     Resp 04/11/18 1137 16     Temp 04/11/18 1137 98.3 F (36.8 C)     Temp Source 04/11/18 1137 Oral     SpO2 04/11/18 1137 97 %     Weight 04/11/18 1137 171 lb (77.6 kg)     Height 04/11/18 1137 5\' 7"  (1.702 m)     Head Circumference --      Peak Flow --      Pain Score 04/11/18 1138 6     Pain Loc --      Pain Edu? --  Excl. in [place]? --     Constitutional: Alert and oriented. Well appearing and in no acute distress. Eyes: Conjunctivae are normal. PERRL. EOMI. Head: Atraumatic. Nose: No congestion/rhinnorhea. Mouth/Throat: Mucous membranes are moist.  Oropharynx non-erythematous. Neck: No stridor.  No meningeal signs.   Cardiovascular: Normal rate, regular rhythm. Good peripheral circulation. Grossly normal heart sounds.   Respiratory: Normal respiratory effort.  No retractions. Lungs CTAB. Gastrointestinal: Soft and nontender. No distention.  Musculoskeletal: No lower extremity tenderness nor edema. No gross deformities of extremities. Neurologic:  Normal speech and language. No gross focal neurologic deficits are appreciated.  Skin:  Skin is warm, dry and intact. No rash noted.   ____________________________________________   LABS (all labs ordered are listed, but only abnormal results are displayed)  Labs Reviewed  COMPREHENSIVE METABOLIC PANEL - Abnormal; Notable for the following components:      Result Value   Potassium 2.8 (*)    Chloride 95 (*)    CO2 33 (*)    Glucose, Bld 101 (*)    BUN 26 (*)    Creatinine, Ser 2.21 (*)    Albumin 3.3 (*)    GFR calc non Af Amer 20 (*)    GFR calc Af Amer 23 (*)    All other components within  normal limits  CBC WITH DIFFERENTIAL/PLATELET - Abnormal; Notable for the following components:   Hemoglobin 11.5 (*)    MCV 75.2 (*)    MCH 23.8 (*)    All other components within normal limits  URINALYSIS, COMPLETE (UACMP) WITH MICROSCOPIC - Abnormal; Notable for the following components:   APPearance HAZY (*)    Glucose, UA >=500 (*)    Hgb urine dipstick MODERATE (*)    Protein, ur 100 (*)    Bacteria, UA RARE (*)    All other components within normal limits  CBG MONITORING, ED   ____________________________________________  EKG   EKG Interpretation  Date/Time:  Friday Apr 11 2018 13:38:47 EDT Ventricular Rate:  74 PR Interval:    QRS Duration: 98 QT Interval:  408 QTC Calculation: 453 R Axis:   43 Text Interpretation:  Sinus rhythm Atrial premature complex Nonspecific T abnormalities, lateral leads No significant change since last tracing in March Confirmed by Merrily Pew [phone]) on 04/11/2018 2:23:43 PM       ____________________________________________  RADIOLOGY  Dg Chest 2 View  Result Date: 04/11/2018 CLINICAL DATA:  Confusion EXAM: CHEST - 2 VIEW COMPARISON:  January 21, 2018 FINDINGS: There is no edema or consolidation. Heart is upper normal in size with pulmonary vascularity within normal limits. No adenopathy. No evident bone lesions. IMPRESSION: No edema or consolidation. Electronically Signed   By: Lowella Grip III M.D.   On: 04/11/2018 15:12   Ct Head Wo Contrast  Result Date: 04/11/2018 CLINICAL DATA:  Confusion.  Hypertension.  Headache. EXAM: CT HEAD WITHOUT CONTRAST TECHNIQUE: Contiguous axial images were obtained from the base of the skull through the vertex without intravenous contrast. COMPARISON:  January 21, 2018 FINDINGS: Brain: There is age related volume loss. There is no intracranial mass, hemorrhage, extra-axial fluid collection, or midline shift. There is patchy small vessel disease throughout the centra semiovale bilaterally, stable. No new  gray-white compartment lesions are identified. There is no evident acute infarct. Vascular: There is no hyperdense vessel. There is calcification in the carotid siphon regions bilaterally. Skull: Bony calvarium appears intact. Sinuses/Orbits: There is mucosal thickening in several ethmoid air cells bilaterally. Other visualized paranasal sinuses are clear. Visualized  orbits appear symmetric bilaterally. Other: Mastoid air cells are clear. IMPRESSION: Age related volume loss with patchy supratentorial small vessel disease, stable. No acute infarct evident. No mass or hemorrhage. There are foci of arterial vascular calcification. There is mucosal thickening in several ethmoid air cells. Electronically Signed   By: Lowella Grip III M.D.   On: 04/11/2018 14:39    ____________________________________________   PROCEDURES  Procedure(s) performed:   Procedures   ____________________________________________   INITIAL IMPRESSION / ASSESSMENT AND PLAN / ED COURSE  Patient reported confusion earlier today.  With this history of her head and high blood pressure at the primary office will do a CT of her head and normal altered mental status work-up however at this time patient does appear well.  Very labile blood pressures while in the emergency department.  Found to be hypokalemic and with urinary tract infection is possible causes for her intermittent confusion but has been alert oriented and very sharp while in the emergency department.  Appears nontoxic.  Appears stable for discharge for continued follow-up with her primary doctor.  Pertinent labs & imaging results that were available during my care of the patient were reviewed by me and considered in my medical decision making (see chart for details).  ____________________________________________  FINAL CLINICAL IMPRESSION(S) / ED DIAGNOSES  Final diagnoses:  Hypertension, unspecified type  Hypokalemia  Urinary tract infection without  hematuria, site unspecified     MEDICATIONS GIVEN DURING THIS VISIT:  Medications  lisinopril (PRINIVIL,ZESTRIL) tablet 20 mg (20 mg Oral Given 04/11/18 1531)  metoprolol tartrate (LOPRESSOR) tablet 25 mg (25 mg Oral Given 04/11/18 1530)  potassium chloride SA (K-DUR,KLOR-CON) CR tablet 40 mEq (40 mEq Oral Given 04/11/18 1656)  potassium chloride 10 mEq in 100 mL IVPB (0 mEq Intravenous Stopped 04/11/18 1908)  nitrofurantoin (macrocrystal-monohydrate) (MACROBID) capsule 100 mg (100 mg Oral Given 04/11/18 1657)     NEW OUTPATIENT MEDICATIONS STARTED DURING THIS VISIT:  Discharge Medication List as of 04/11/2018  6:13 PM    START taking these medications   Details  nitrofurantoin, macrocrystal-monohydrate, (MACROBID) 100 MG capsule Take 1 capsule (100 mg total) by mouth 2 (two) times daily. X 7 days, Starting Fri 04/11/2018, Print    potassium chloride SA (K-DUR,KLOR-CON) 20 MEQ tablet Take 1 tablet (20 mEq total) by mouth 2 (two) times daily., Starting Fri 04/11/2018, Print        Note:  This note was prepared with assistance of Dragon voice recognition software. Occasional wrong-word or sound-a-like substitutions may have occurred due to the inherent limitations of voice recognition software.   Merrily Pew, MD 04/11/18 2146

## 2018-04-11 NOTE — Progress Notes (Signed)
Subjective:    Patient ID: Krystal [name], female    DOB: 1936/09/18, PCP Iona Beard, MD   Past Medical History:  Diagnosis Date  . Allergic rhinitis   . CAD (coronary artery disease) 2009   5 stents- #3 in RCA, #1 each in LAD and AVG  . CHF (congestive heart failure) ([place])   . CKD (chronic kidney disease) stage 3, GFR 30-59 ml/min (HCC)   . Complete heart block, transient 2014  . Diastolic dysfunction, left ventricle   . DJD (degenerative joint disease)   . Emphysema lung (HCC)    2L N/C continuously  . Essential hypertension   . GERD (gastroesophageal reflux disease)   . Glaucoma   . Gout   . History of uterine cancer   . HOH (hard of hearing)   . Hyperlipidemia   . MI (myocardial infarction) ([place]) 1998  . Microcytic anemia    History of occult blood in stool  . Myalgia   . Osteoarthritis   . Palpitations   . Personal history of colonic polyps   . Sleep apnea    CPAP  . Type II diabetes mellitus with nephropathy [facility])    Past Surgical History:  Procedure Laterality Date  . ABDOMINAL HYSTERECTOMY  04/2010   Uterine cancer,TAHBSO  . CERVICAL DISCECTOMY     L5 left/hemilminectomy  . COLONOSCOPY  09/2006   Int hemmorhoids, COMPLICATED BY CARDIOPULMONARY COMPLICATIONS  . CORONARY ANGIOPLASTY  1/99, 1/07, 1/08, 4/09   5 cardiac stents total  . ECTOPIC PREGNANCY SURGERY    . FOOT SURGERY     Left and right for callous  . LEFT AND RIGHT HEART CATHETERIZATION WITH CORONARY ANGIOGRAM N/A 09/12/2012   Procedure: LEFT AND RIGHT HEART CATHETERIZATION WITH CORONARY ANGIOGRAM;  Surgeon: Sanda Klein, MD;  Location: [place] CATH LAB;  Service: Cardiovascular;  Laterality: N/A;  . POLYPECTOMY  10/22/2011   Internal hemorrhoids/sessile polyp   Social History   Socioeconomic History  . Marital status: Widowed    Spouse name: Not on file  . Number of children: 0  . Years of education: 43  . Highest education level: Not on file  Occupational History    Comment: retired   Scientific laboratory technician  . Financial resource strain: Not on file  . Food insecurity:    Worry: Not on file    Inability: Not on file  . Transportation needs:    Medical: Not on file    Non-medical: Not on file  Tobacco Use  . Smoking status: Former Smoker    Packs/day: 0.40    Years: 51.00    Pack years: 20.40    Types: Cigarettes    Start date: 04/22/1955    Last attempt to quit: 10/18/2006    Years since quitting: 11.4  . Smokeless tobacco: Never Used  . Tobacco comment: quit about 4 yrs ago  Substance and Sexual Activity  . Alcohol use: No    Alcohol/week: 0.0 oz  . Drug use: No  . Sexual activity: Never    Birth control/protection: Surgical  Lifestyle  . Physical activity:    Days per week: Not on file    Minutes per session: Not on file  . Stress: Not on file  Relationships  . Social connections:    Talks on phone: Not on file    Gets together: Not on file    Attends religious service: Not on file    Active member of club or organization: Not on file  Attends meetings of clubs or organizations: Not on file    Relationship status: Not on file  Other Topics Concern  . Not on file  Social History Narrative   Consumes 2 cups of caffeine daily   Outpatient Encounter Medications as of 04/11/2018  Medication Sig  . ACCU-CHEK FASTCLIX LANCETS MISC USE AS DIRECTED TWICE DAILY.  Marland Kitchen aspirin 81 MG EC tablet Take 81 mg by mouth every morning.   . BD PEN NEEDLE NANO U/F 32G X 4 MM MISC USE AS DIRECTED TWICE DAILY.  . budesonide-formoterol (SYMBICORT) 160-4.5 MCG/ACT inhaler Inhale 2 puffs into the lungs 2 (two) times daily.  . calcitRIOL (ROCALTROL) 0.25 MCG capsule TAKE 1 CAPSULE BY MOUTH ONCE A DAY.  Marland Kitchen Continuous Blood Gluc Sensor (FREESTYLE LIBRE SENSOR SYSTEM) MISC Use one sensor every 10 days.  . ergocalciferol (VITAMIN D2) 50000 units capsule Take 50,000 Units by mouth once a week.  . ezetimibe (ZETIA) 10 MG tablet TAKE 1 TABLET BY MOUTH EVERY MORNING.  . famotidine (PEPCID)  20 MG tablet Take 1 tablet (20 mg total) by mouth daily.  . Fluticasone-Salmeterol (ADVAIR DISKUS) 250-50 MCG/DOSE AEPB Inhale 1 puff into the lungs 2 (two) times daily.  Marland Kitchen glucose blood (ACCU-CHEK GUIDE) test strip Use as instructed 4 x daily  . insulin aspart protamine - aspart (NOVOLOG MIX 70/30 FLEXPEN) (70-30) 100 UNIT/ML FlexPen 60 units in the a.m. And 40 units at night  . Insulin Pen Needle (B-D ULTRAFINE III SHORT PEN) 31G X 8 MM MISC 1 each by Does not apply route as directed.  . isosorbide mononitrate (IMDUR) 60 MG 24 hr tablet TAKE (1) TABLET BY MOUTH ONCE DAILY.  Marland Kitchen lisinopril (PRINIVIL,ZESTRIL) 20 MG tablet TAKE (1) TABLET BY MOUTH ONCE DAILY.  . meclizine (ANTIVERT) 25 MG tablet Take 25 mg by mouth 4 (four) times daily as needed for dizziness.  . metoprolol tartrate (LOPRESSOR) 25 MG tablet Take 1 tablet (25 mg total) by mouth 2 (two) times daily.  . nitroGLYCERIN (NITROSTAT) 0.4 MG SL tablet Place 1 tablet (0.4 mg total) under the tongue every 5 (five) minutes as needed for chest pain.  Glory Rosebush DELICA LANCETS 96V MISC USE AS DIRECTED THREE TIMES DAILY.  Marland Kitchen polyethylene glycol (MIRALAX / GLYCOLAX) packet Take 17 g by mouth daily as needed for mild constipation.  Marland Kitchen RANEXA 500 MG 12 hr tablet TAKE (2) TABLETS BY MOUTH TWICE DAILY.  . ranolazine (RANEXA) 1000 MG SR tablet Take 1 tablet (1,000 mg total) by mouth 2 (two) times daily.  . rivaroxaban (XARELTO) 2.5 MG TABS tablet Take 1 tablet (2.5 mg total) by mouth 2 (two) times daily.  . rosuvastatin (CRESTOR) 20 MG tablet Take 1 tablet (20 mg total) by mouth every evening.  . sennosides-docusate sodium (SENOKOT-S) 8.6-50 MG tablet Take 1 tablet by mouth daily as needed for constipation.   . torsemide (DEMADEX) 20 MG tablet TAKE 1 TABLET BY MOUTH ONCE DAILY FOR SWELLING, MAY TAKE 1 EXTRA DAILY IF SWELLING WORSENS.  Marland Kitchen traMADol (ULTRAM) 50 MG tablet Take 1 tablet (50 mg total) by mouth every 6 (six) hours as needed for moderate pain.    No facility-administered encounter medications on file as of 04/11/2018.    ALLERGIES: Allergies  Allergen Reactions  . Morphine Shortness Of Breath and Swelling  . Penicillins Shortness Of Breath and Swelling    Has patient had a PCN reaction causing immediate rash, facial/tongue/throat swelling, SOB or lightheadedness with hypotension: No Has patient had a PCN reaction causing  severe rash involving mucus membranes or skin necrosis: No Has patient had a PCN reaction that required hospitalization Yes Has patient had a PCN reaction occurring within the last 10 years: No If all of the above answers are "NO", then may proceed with Cephalosporin use.   . Shellfish Allergy    VACCINATION STATUS: Immunization History  Administered Date(s) Administered  . Influenza Whole 09/23/2007  . Influenza,inj,Quad PF,6+ Mos 08/20/2015  . Influenza-Unspecified 09/19/2013  . Pneumococcal Polysaccharide-23 08/20/2015    Diabetes  She presents for her follow-up diabetic visit. She has type 2 diabetes mellitus. Onset time: She was diagnosed at approximate age of 76 years. Her disease course has been worsening. Hypoglycemia symptoms include confusion, dizziness and headaches. Pertinent negatives for hypoglycemia include no pallor or seizures. Associated symptoms include fatigue, polydipsia and polyuria. Pertinent negatives for diabetes include no chest pain and no polyphagia. There are no hypoglycemic complications. Symptoms are worsening. Diabetic complications include heart disease, nephropathy, peripheral neuropathy and PVD. Risk factors for coronary artery disease include dyslipidemia, diabetes mellitus, hypertension, obesity, sedentary lifestyle and tobacco exposure. Current diabetic treatment includes insulin injections (She was asked to bring all of her insulin supplies at home. She managed to bring to bags of various kinds of insulin mainly duplicates of long-acting, premixed, and rapid acting insulin  types. At least half of the insulin pens she brought in are expi). Her weight is decreasing steadily. She is following a generally unhealthy diet. When asked about meal planning, she reported none. She has had a previous visit with a dietitian. She never participates in exercise. (He did not bring any meter nor logs with her today.) An ACE inhibitor/angiotensin II receptor blocker is being taken. Eye exam is current.  Hyperlipidemia  This is a chronic problem. The current episode started more than 1 year ago. The problem is controlled. Exacerbating diseases include diabetes and obesity. Pertinent negatives include no chest pain, myalgias or shortness of breath. Current antihyperlipidemic treatment includes bile acid squestrants and statins. Risk factors for coronary artery disease include diabetes mellitus, dyslipidemia, hypertension and obesity.  Hypertension  This is a chronic problem. The current episode started more than 1 year ago. Associated symptoms include headaches. Pertinent negatives include no chest pain, palpitations or shortness of breath. Risk factors for coronary artery disease include diabetes mellitus, dyslipidemia, obesity, sedentary lifestyle and smoking/tobacco exposure. Past treatments include ACE inhibitors. Hypertensive end-organ damage includes kidney disease, CAD/MI and PVD.    Review of Systems  Constitutional: Positive for fatigue. Negative for unexpected weight change.  HENT: Negative for trouble swallowing and voice change.   Eyes: Negative for visual disturbance.  Respiratory: Negative for cough, shortness of breath and wheezing.   Cardiovascular: Negative for chest pain, palpitations and leg swelling.  Gastrointestinal: Negative for diarrhea, nausea and vomiting.  Endocrine: Positive for polydipsia and polyuria. Negative for cold intolerance, heat intolerance and polyphagia.  Musculoskeletal: Negative for arthralgias and myalgias.  Skin: Negative for color change,  pallor, rash and wound.  Neurological: Positive for dizziness and headaches. Negative for seizures.  Psychiatric/Behavioral: Positive for confusion and dysphoric mood. Negative for suicidal ideas.    Objective:    BP (!) 183/95   Pulse 80   Ht 5\' 7"  (1.702 m)   Wt 171 lb 3.2 oz (77.7 kg)   BMI 26.81 kg/m   Wt Readings from Last 3 Encounters:  04/11/18 171 lb 3.2 oz (77.7 kg)  03/25/18 172 lb (78 kg)  03/18/18 185 lb (83.9 kg)  Physical Exam  Constitutional:  Patient is confused.  She handed out her phone saying that it was  her meter.  HENT:  Head: Normocephalic.  Eyes: Pupils are equal, round, and reactive to light.  Neck: Normal range of motion. No tracheal deviation present. No thyromegaly present.  Cardiovascular:  Pulses:      Dorsalis pedis pulses are 0 on the right side, and 0 on the left side.       Posterior tibial pulses are 0 on the right side, and 0 on the left side.  She has absent pulsations on right lateral dorsalis pedis and posterior tibial arteries.  Pulmonary/Chest: Breath sounds normal.  Abdominal: There is no tenderness. There is no guarding.  Musculoskeletal: Normal range of motion. She exhibits no edema.  Neurological: A sensory deficit is present. No cranial nerve deficit. Coordination normal.  She has significant deficit on monofilament test on bilateral feet.  Skin: Skin is warm and dry. No rash noted. No erythema. No pallor.  Psychiatric:  Patient with significant cognitive deficit.    Complete Blood Count (Most recent): Lab Results  Component Value Date   WBC 6.7 01/21/2018   HGB 11.9 (L) 01/21/2018   HCT 39.2 01/21/2018   MCV 75.8 (L) 01/21/2018   PLT 242 01/21/2018   Chemistry (most recent): Lab Results  Component Value Date   NA 139 03/17/2018   K 4.3 03/17/2018   CL 98 03/17/2018   CO2 35 (H) 03/17/2018   BUN 30 (H) 03/17/2018   CREATININE 2.40 (H) 03/17/2018   Diabetic Labs (most recent): Lab Results  Component Value Date    HGBA1C 11.2 (H) 03/17/2018   HGBA1C 10.4 (H) 09/17/2017   HGBA1C 9.5 (H) 06/17/2017   Lipid Panel     Component Value Date/Time   CHOL 151 05/07/2016 1006   TRIG 217 (H) 05/07/2016 1006   HDL 37 (L) 05/07/2016 1006   CHOLHDL 4.1 05/07/2016 1006   VLDL 43 (H) 05/07/2016 1006   LDLCALC 71 05/07/2016 1006   LDLDIRECT 118.4 08/13/2013 1502     Assessment & Plan:   1. Uncontrolled type 2 diabetes with multiple complications  - Her  diabetes is  complicated by coronary artery disease, stage 4 chronic kidney disease, connective deficit, peripheral neuropathy, and peripheral arterial disease and patient remains at a high risk for more acute and chronic complications of diabetes which include CAD, CVA, CKD, retinopathy, and neuropathy. These are all discussed in detail with the patient.  -Her recent A1c  is higher at 11.2% . Recent labs reviewed. -Patient is too confused to comprehend for insulin management.  Her blood pressure was found to be elevated at 183/95.  Patient complains of fatigue and not feeling well.  She could not elaborate more on that.  She will return in 1 week for diabetes follow-up.  Glade Lloyd, MD Phone: [phone]  Fax: [phone]  -  This note was partially dictated with voice recognition software. Similar sounding words can be transcribed inadequately or may not  be corrected upon review.  04/11/2018, 10:58 AM

## 2018-04-11 NOTE — ED Triage Notes (Signed)
Patient sent from Dr Dorris Fetch with note stating patient is confused and hypertensive at 183/95. Patient complaining of "slight headache" from fall 2 weeks ago. Per caregiver with patient, she is in normal state.

## 2018-04-11 NOTE — ED Notes (Signed)
Pt transported to CT ?

## 2018-04-16 ENCOUNTER — Ambulatory Visit (HOSPITAL_COMMUNITY)
Admission: RE | Admit: 2018-04-16 | Discharge: 2018-04-16 | Disposition: A | Discharge status: Home / Self Care | Payer: Medicare Other | Source: Ambulatory Visit | Attending: Pulmonary Disease | Admitting: Pulmonary Disease

## 2018-04-16 DIAGNOSIS — I6782 Cerebral ischemia: Secondary | ICD-10-CM | POA: Insufficient documentation

## 2018-04-16 DIAGNOSIS — R55 Syncope and collapse: Secondary | ICD-10-CM | POA: Insufficient documentation

## 2018-04-16 DIAGNOSIS — G319 Degenerative disease of nervous system, unspecified: Secondary | ICD-10-CM | POA: Insufficient documentation

## 2018-04-16 DIAGNOSIS — R42 Dizziness and giddiness: Secondary | ICD-10-CM | POA: Diagnosis not present

## 2018-04-17 ENCOUNTER — Ambulatory Visit: Payer: Medicare Other | Admitting: "Endocrinology

## 2018-04-19 ENCOUNTER — Encounter (HOSPITAL_COMMUNITY): Payer: Self-pay | Admitting: Emergency Medicine

## 2018-04-19 ENCOUNTER — Emergency Department (HOSPITAL_COMMUNITY): Payer: Medicare Other

## 2018-04-19 ENCOUNTER — Other Ambulatory Visit: Payer: Self-pay

## 2018-04-19 ENCOUNTER — Observation Stay (HOSPITAL_COMMUNITY)
Admission: EM | Admit: 2018-04-19 | Discharge: 2018-04-21 | Disposition: A | Discharge status: Home / Self Care | Payer: Medicare Other | Attending: Internal Medicine | Admitting: Internal Medicine

## 2018-04-19 DIAGNOSIS — E1122 Type 2 diabetes mellitus with diabetic chronic kidney disease: Secondary | ICD-10-CM

## 2018-04-19 DIAGNOSIS — I10 Essential (primary) hypertension: Secondary | ICD-10-CM | POA: Diagnosis not present

## 2018-04-19 DIAGNOSIS — Z87891 Personal history of nicotine dependence: Secondary | ICD-10-CM | POA: Insufficient documentation

## 2018-04-19 DIAGNOSIS — Z7901 Long term (current) use of anticoagulants: Secondary | ICD-10-CM | POA: Diagnosis not present

## 2018-04-19 DIAGNOSIS — I252 Old myocardial infarction: Secondary | ICD-10-CM | POA: Diagnosis not present

## 2018-04-19 DIAGNOSIS — Z79899 Other long term (current) drug therapy: Secondary | ICD-10-CM | POA: Insufficient documentation

## 2018-04-19 DIAGNOSIS — Z7982 Long term (current) use of aspirin: Secondary | ICD-10-CM | POA: Diagnosis not present

## 2018-04-19 DIAGNOSIS — E782 Mixed hyperlipidemia: Secondary | ICD-10-CM

## 2018-04-19 DIAGNOSIS — E1165 Type 2 diabetes mellitus with hyperglycemia: Secondary | ICD-10-CM | POA: Diagnosis not present

## 2018-04-19 DIAGNOSIS — I5033 Acute on chronic diastolic (congestive) heart failure: Secondary | ICD-10-CM | POA: Diagnosis not present

## 2018-04-19 DIAGNOSIS — N183 Chronic kidney disease, stage 3 unspecified: Secondary | ICD-10-CM | POA: Diagnosis present

## 2018-04-19 DIAGNOSIS — I5032 Chronic diastolic (congestive) heart failure: Secondary | ICD-10-CM

## 2018-04-19 DIAGNOSIS — R55 Syncope and collapse: Principal | ICD-10-CM | POA: Diagnosis present

## 2018-04-19 DIAGNOSIS — Z8542 Personal history of malignant neoplasm of other parts of uterus: Secondary | ICD-10-CM | POA: Diagnosis not present

## 2018-04-19 DIAGNOSIS — S299XXA Unspecified injury of thorax, initial encounter: Secondary | ICD-10-CM | POA: Diagnosis not present

## 2018-04-19 DIAGNOSIS — I130 Hypertensive heart and chronic kidney disease with heart failure and stage 1 through stage 4 chronic kidney disease, or unspecified chronic kidney disease: Secondary | ICD-10-CM | POA: Diagnosis not present

## 2018-04-19 DIAGNOSIS — N184 Chronic kidney disease, stage 4 (severe): Secondary | ICD-10-CM | POA: Diagnosis not present

## 2018-04-19 DIAGNOSIS — I2510 Atherosclerotic heart disease of native coronary artery without angina pectoris: Secondary | ICD-10-CM | POA: Diagnosis not present

## 2018-04-19 DIAGNOSIS — S0990XA Unspecified injury of head, initial encounter: Secondary | ICD-10-CM | POA: Diagnosis not present

## 2018-04-19 DIAGNOSIS — Z794 Long term (current) use of insulin: Secondary | ICD-10-CM | POA: Diagnosis not present

## 2018-04-19 DIAGNOSIS — I951 Orthostatic hypotension: Secondary | ICD-10-CM

## 2018-04-19 DIAGNOSIS — J449 Chronic obstructive pulmonary disease, unspecified: Secondary | ICD-10-CM | POA: Insufficient documentation

## 2018-04-19 DIAGNOSIS — M6282 Rhabdomyolysis: Secondary | ICD-10-CM

## 2018-04-19 DIAGNOSIS — M545 Low back pain: Secondary | ICD-10-CM | POA: Diagnosis not present

## 2018-04-19 DIAGNOSIS — W19XXXA Unspecified fall, initial encounter: Secondary | ICD-10-CM | POA: Diagnosis not present

## 2018-04-19 DIAGNOSIS — M5489 Other dorsalgia: Secondary | ICD-10-CM | POA: Diagnosis not present

## 2018-04-19 LAB — CBC WITH DIFFERENTIAL/PLATELET
BASOS ABS: 0 10*3/uL (ref 0.0–0.1)
BASOS PCT: 0 %
Eosinophils Absolute: 0.3 10*3/uL (ref 0.0–0.7)
Eosinophils Relative: 4 %
HEMATOCRIT: 36.6 % (ref 36.0–46.0)
Hemoglobin: 11.3 g/dL — ABNORMAL LOW (ref 12.0–15.0)
Lymphocytes Relative: 25 %
Lymphs Abs: 1.9 10*3/uL (ref 0.7–4.0)
MCH: 23.5 pg — ABNORMAL LOW (ref 26.0–34.0)
MCHC: 30.9 g/dL (ref 30.0–36.0)
MCV: 76.3 fL — AB (ref 78.0–100.0)
MONO ABS: 0.8 10*3/uL (ref 0.1–1.0)
Monocytes Relative: 10 %
NEUTROS ABS: 4.5 10*3/uL (ref 1.7–7.7)
Neutrophils Relative %: 61 %
PLATELETS: 232 10*3/uL (ref 150–400)
RBC: 4.8 MIL/uL (ref 3.87–5.11)
RDW: 15.8 % — AB (ref 11.5–15.5)
WBC: 7.4 10*3/uL (ref 4.0–10.5)

## 2018-04-19 LAB — COMPREHENSIVE METABOLIC PANEL
ALT: 26 U/L (ref 14–54)
ANION GAP: 10 (ref 5–15)
AST: 29 U/L (ref 15–41)
Albumin: 3.4 g/dL — ABNORMAL LOW (ref 3.5–5.0)
Alkaline Phosphatase: 51 U/L (ref 38–126)
BUN: 25 mg/dL — ABNORMAL HIGH (ref 6–20)
CALCIUM: 9.1 mg/dL (ref 8.9–10.3)
CHLORIDE: 97 mmol/L — AB (ref 101–111)
CO2: 31 mmol/L (ref 22–32)
Creatinine, Ser: 2.28 mg/dL — ABNORMAL HIGH (ref 0.44–1.00)
GFR calc non Af Amer: 19 mL/min — ABNORMAL LOW (ref >60)
GFR, EST AFRICAN AMERICAN: 22 mL/min — AB (ref >60)
Glucose, Bld: 325 mg/dL — ABNORMAL HIGH (ref 65–99)
POTASSIUM: 4.1 mmol/L (ref 3.5–5.1)
SODIUM: 138 mmol/L (ref 135–145)
Total Bilirubin: 0.5 mg/dL (ref 0.3–1.2)
Total Protein: 6.7 g/dL (ref 6.5–8.1)

## 2018-04-19 LAB — URINALYSIS, ROUTINE W REFLEX MICROSCOPIC
BILIRUBIN URINE: NEGATIVE
Glucose, UA: >=500 mg/dL — AB
HGB URINE DIPSTICK: NEGATIVE
KETONES UR: NEGATIVE mg/dL
LEUKOCYTES UA: NEGATIVE
NITRITE: NEGATIVE
Protein, ur: 100 mg/dL — AB
Specific Gravity, Urine: 1.008 (ref 1.005–1.030)
pH: 5 (ref 5.0–8.0)

## 2018-04-19 LAB — GLUCOSE, CAPILLARY
GLUCOSE-CAPILLARY: 231 mg/dL — AB (ref 65–99)
Glucose-Capillary: 143 mg/dL — ABNORMAL HIGH (ref 65–99)

## 2018-04-19 LAB — HEMOGLOBIN A1C
HEMOGLOBIN A1C: 11 % — AB (ref 4.8–5.6)
MEAN PLASMA GLUCOSE: 269 mg/dL

## 2018-04-19 LAB — LACTIC ACID, PLASMA
LACTIC ACID, VENOUS: 1.1 mmol/L (ref 0.5–1.9)
LACTIC ACID, VENOUS: 0.8 mmol/L (ref 0.5–1.9)

## 2018-04-19 LAB — TROPONIN I: Troponin I: 0.04 ng/mL (ref <0.03)

## 2018-04-19 LAB — TSH: TSH: 1.783 u[IU]/mL (ref 0.350–4.500)

## 2018-04-19 LAB — CK: Total CK: 518 U/L — ABNORMAL HIGH (ref 38–234)

## 2018-04-19 MED ORDER — EZETIMIBE 10 MG PO TABS
10.0000 mg | ORAL_TABLET | Freq: Every morning | ORAL | Status: DC
  Administered 2018-04-19 – 2018-04-21 (×3): 10 mg via ORAL
  Filled 2018-04-19 (×3): qty 1

## 2018-04-19 MED ORDER — ASPIRIN EC 81 MG PO TBEC
81.0000 mg | DELAYED_RELEASE_TABLET | Freq: Every day | ORAL | Status: DC
  Administered 2018-04-20 – 2018-04-21 (×2): 81 mg via ORAL
  Filled 2018-04-19 (×2): qty 1

## 2018-04-19 MED ORDER — INSULIN ASPART PROT & ASPART (70-30 MIX) 100 UNIT/ML PEN: 50.0000 [IU] | PEN_INJECTOR | Freq: Two times a day (BID) | SUBCUTANEOUS | Status: DC

## 2018-04-19 MED ORDER — INSULIN ASPART PROT & ASPART (70-30 MIX) 100 UNIT/ML ~~LOC~~ SUSP
70.0000 [IU] | Freq: Every day | SUBCUTANEOUS | Status: DC
  Administered 2018-04-20 – 2018-04-21 (×2): 70 [IU] via SUBCUTANEOUS

## 2018-04-19 MED ORDER — INSULIN ASPART PROT & ASPART (70-30 MIX) 100 UNIT/ML ~~LOC~~ SUSP
40.0000 [IU] | Freq: Every day | SUBCUTANEOUS | Status: DC
  Administered 2018-04-19: 40 [IU] via SUBCUTANEOUS
  Filled 2018-04-19 (×2): qty 10

## 2018-04-19 MED ORDER — ACETAMINOPHEN 325 MG PO TABS
650.0000 mg | ORAL_TABLET | Freq: Four times a day (QID) | ORAL | Status: DC | PRN
  Administered 2018-04-20: 650 mg via ORAL
  Filled 2018-04-19: qty 2

## 2018-04-19 MED ORDER — ONDANSETRON HCL 4 MG/2ML IJ SOLN: 4.0000 mg | Freq: Four times a day (QID) | INTRAMUSCULAR | Status: DC | PRN

## 2018-04-19 MED ORDER — MECLIZINE HCL 12.5 MG PO TABS: 25.0000 mg | ORAL_TABLET | Freq: Four times a day (QID) | ORAL | Status: DC | PRN

## 2018-04-19 MED ORDER — RIVAROXABAN 2.5 MG PO TABS
2.5000 mg | ORAL_TABLET | Freq: Two times a day (BID) | ORAL | Status: DC
  Administered 2018-04-20 – 2018-04-21 (×4): 2.5 mg via ORAL
  Filled 2018-04-19 (×9): qty 1

## 2018-04-19 MED ORDER — SODIUM CHLORIDE 0.9 % IV SOLN
INTRAVENOUS | Status: DC
  Administered 2018-04-19: 13:00:00 via INTRAVENOUS

## 2018-04-19 MED ORDER — FAMOTIDINE 20 MG PO TABS
20.0000 mg | ORAL_TABLET | Freq: Every day | ORAL | Status: DC
  Administered 2018-04-19 – 2018-04-21 (×3): 20 mg via ORAL
  Filled 2018-04-19 (×3): qty 1

## 2018-04-19 MED ORDER — ERGOCALCIFEROL 1.25 MG (50000 UT) PO CAPS: 50000.0000 [IU] | ORAL_CAPSULE | ORAL | Status: DC

## 2018-04-19 MED ORDER — CALCITRIOL 0.25 MCG PO CAPS
0.2500 ug | ORAL_CAPSULE | Freq: Every day | ORAL | Status: DC
  Administered 2018-04-19 – 2018-04-21 (×3): 0.25 ug via ORAL
  Filled 2018-04-19 (×3): qty 1

## 2018-04-19 MED ORDER — ROSUVASTATIN CALCIUM 20 MG PO TABS
20.0000 mg | ORAL_TABLET | Freq: Every evening | ORAL | Status: DC
  Administered 2018-04-19: 20 mg via ORAL
  Filled 2018-04-19 (×2): qty 1

## 2018-04-19 MED ORDER — RANOLAZINE ER 500 MG PO TB12
500.0000 mg | ORAL_TABLET | Freq: Two times a day (BID) | ORAL | Status: DC
  Administered 2018-04-20 – 2018-04-21 (×4): 500 mg via ORAL
  Filled 2018-04-19 (×4): qty 1

## 2018-04-19 MED ORDER — ASPIRIN 81 MG PO TBEC: 81.0000 mg | DELAYED_RELEASE_TABLET | Freq: Every morning | ORAL | Status: DC

## 2018-04-19 MED ORDER — TRAMADOL HCL 50 MG PO TABS: 50.0000 mg | ORAL_TABLET | Freq: Four times a day (QID) | ORAL | Status: DC | PRN

## 2018-04-19 MED ORDER — VITAMIN D (ERGOCALCIFEROL) 1.25 MG (50000 UNIT) PO CAPS
50000.0000 [IU] | ORAL_CAPSULE | ORAL | Status: DC
  Administered 2018-04-20: 50000 [IU] via ORAL
  Filled 2018-04-19 (×2): qty 1

## 2018-04-19 MED ORDER — SENNOSIDES-DOCUSATE SODIUM 8.6-50 MG PO TABS: 1.0000 | ORAL_TABLET | Freq: Every evening | ORAL | Status: DC | PRN

## 2018-04-19 MED ORDER — ONDANSETRON HCL 4 MG PO TABS: 4.0000 mg | ORAL_TABLET | Freq: Four times a day (QID) | ORAL | Status: DC | PRN

## 2018-04-19 MED ORDER — POLYETHYLENE GLYCOL 3350 17 G PO PACK: 17.0000 g | PACK | Freq: Every day | ORAL | Status: DC | PRN

## 2018-04-19 MED ORDER — ACETAMINOPHEN 650 MG RE SUPP: 650.0000 mg | Freq: Four times a day (QID) | RECTAL | Status: DC | PRN

## 2018-04-19 MED ORDER — SODIUM CHLORIDE 0.9 % IV SOLN: INTRAVENOUS | Status: DC

## 2018-04-19 MED ORDER — MOMETASONE FURO-FORMOTEROL FUM 200-5 MCG/ACT IN AERO
2.0000 | INHALATION_SPRAY | Freq: Two times a day (BID) | RESPIRATORY_TRACT | Status: DC
Start: 2018-04-19 — End: 2018-04-21
  Administered 2018-04-19 – 2018-04-21 (×4): 2 via RESPIRATORY_TRACT
  Filled 2018-04-19 (×2): qty 8.8

## 2018-04-19 MED ORDER — LORATADINE 10 MG PO TABS
10.0000 mg | ORAL_TABLET | Freq: Every day | ORAL | Status: DC
  Administered 2018-04-19 – 2018-04-21 (×3): 10 mg via ORAL
  Filled 2018-04-19 (×3): qty 1

## 2018-04-19 MED ORDER — SENNA-DOCUSATE SODIUM 8.6-50 MG PO TABS: 1.0000 | ORAL_TABLET | Freq: Every day | ORAL | Status: DC | PRN

## 2018-04-19 NOTE — ED Notes (Signed)
Patient taken off of bed pan.

## 2018-04-19 NOTE — H&P (Signed)
History and Physical    Krystal [name] VEL:[id number] DOB: 1936-10-12 DOA: 04/19/2018  Referring MD/NP/PA: Francine [name], EDP PCP: Krystal Beard, MD  Patient coming from: Home  Chief Complaint: Passed out  HPI: Krystal [name] is a 82 y.o. female with multiple medical comorbidities including stage IV chronic kidney disease, insulin-dependent diabetes, GERD, hypertension, coronary artery disease, chronic diastolic heart failure, hyperlipidemia among other issues who presents to the hospital today after a syncopal event.  She states she went to get out of bed early this morning and the next thing she remembers is waking up hours later when the son was up.  She distinctly remembers it being dark when she first got out of bed.  She was unable to pull herself back up into bed and so she pressed her life alert button.  EMS brought her to the hospital for evaluation.  In the ED she was found to be orthostatic although not hypotensive, labs show a creatinine of 2.28 which is around her baseline, a troponin of 0.04, total CK of 518 at the normal lactic acid of 0.8.  Chest x-ray without acute cardiopulmonary changes.  Lumbar spine x-ray shows questionable minor superior endplate fracture at L4.  CT scan of the head without acute findings, EKG shows normal sinus rhythm with PAC, wavy baseline makes ST T changes difficult to interpret.  Admission has been requested for management of syncopal event.  Past Medical/Surgical History: Past Medical History:  Diagnosis Date  . Allergic rhinitis   . CAD (coronary artery disease) 2009   5 stents- #3 in RCA, #1 each in LAD and AVG  . CHF (congestive heart failure) ([place])   . CKD (chronic kidney disease) stage 3, GFR 30-59 ml/min (HCC)   . Complete heart block, transient 2014  . Diastolic dysfunction, left ventricle   . DJD (degenerative joint disease)   . Emphysema lung (HCC)    2L N/C continuously  . Essential hypertension   . GERD (gastroesophageal reflux  disease)   . Glaucoma   . Gout   . History of uterine cancer   . HOH (hard of hearing)   . Hyperlipidemia   . MI (myocardial infarction) ([place]) 1998  . Microcytic anemia    History of occult blood in stool  . Myalgia   . Osteoarthritis   . Palpitations   . Personal history of colonic polyps   . Sleep apnea    CPAP  . Type II diabetes mellitus with nephropathy [facility])     Past Surgical History:  Procedure Laterality Date  . ABDOMINAL HYSTERECTOMY  04/2010   Uterine cancer,TAHBSO  . CERVICAL DISCECTOMY     L5 left/hemilminectomy  . COLONOSCOPY  09/2006   Int hemmorhoids, COMPLICATED BY CARDIOPULMONARY COMPLICATIONS  . CORONARY ANGIOPLASTY  1/99, 1/07, 1/08, 4/09   5 cardiac stents total  . ECTOPIC PREGNANCY SURGERY    . FOOT SURGERY     Left and right for callous  . LEFT AND RIGHT HEART CATHETERIZATION WITH CORONARY ANGIOGRAM N/A 09/12/2012   Procedure: LEFT AND RIGHT HEART CATHETERIZATION WITH CORONARY ANGIOGRAM;  Surgeon: Krystal Klein, MD;  Location: [facility]ATH [facility];  Service: Cardiovascular;  Laterality: N/A;  . POLYPECTOMY  10/22/2011   Internal hemorrhoids/sessile polyp    Social History:  reports that she quit smoking about 11 years ago. Her smoking use included cigarettes. She started smoking about 63 years ago. She has a 20.40 pack-year smoking history. She has never used smokeless tobacco. She reports that she does  not drink alcohol or use drugs.  Allergies: Allergies  Allergen Reactions  . Morphine Shortness Of Breath and Swelling  . Penicillins Shortness Of Breath and Swelling    Has patient had a PCN reaction causing immediate rash, facial/tongue/throat swelling, SOB or lightheadedness with hypotension: No Has patient had a PCN reaction causing severe rash involving mucus membranes or skin necrosis: No Has patient had a PCN reaction that required hospitalization Yes Has patient had a PCN reaction occurring within the last 10 years: No If all of the above answers  are "NO", then may proceed with Cephalosporin use.   Marland Kitchen Shellfish Allergy     Family History:  Family History  Problem Relation Age of Onset  . Anesthesia problems Neg Hx   . Hypotension Neg Hx   . Malignant hyperthermia Neg Hx   . Pseudochol deficiency Neg Hx     Prior to Admission medications   Medication Sig Start Date End Date Taking? Authorizing Provider  ACCU-CHEK FASTCLIX LANCETS MISC USE AS DIRECTED TWICE DAILY. 12/20/17  Yes Krystal Anger, MD  aspirin 81 MG EC tablet Take 81 mg by mouth every morning.    Yes [provider]  BD PEN NEEDLE NANO U/F 32G X 4 MM MISC USE AS DIRECTED TWICE DAILY. 06/24/17  Yes Krystal Anger, MD  budesonide-formoterol (SYMBICORT) 160-4.5 MCG/ACT inhaler Inhale 2 puffs into the lungs 2 (two) times daily.   Yes [provider]  calcitRIOL (ROCALTROL) 0.25 MCG capsule TAKE 1 CAPSULE BY MOUTH ONCE A DAY. 02/05/18  Yes Krystal Commons, MD  Continuous Blood Gluc Sensor (FREESTYLE LIBRE SENSOR SYSTEM) MISC Use one sensor every 10 days. 06/20/17  Yes Krystal Anger, MD  ergocalciferol (VITAMIN D2) 50000 units capsule Take 50,000 Units by mouth once a week.   Yes [provider]  ezetimibe (ZETIA) 10 MG tablet TAKE 1 TABLET BY MOUTH EVERY MORNING. 10/18/17  Yes Krystal Commons, MD  famotidine (PEPCID) 20 MG tablet Take 1 tablet (20 mg total) by mouth daily. 08/23/15  Yes Krystal Falconer, MD  Fluticasone-Salmeterol (ADVAIR DISKUS) 250-50 MCG/DOSE AEPB Inhale 1 puff into the lungs 2 (two) times daily. 11/10/15  Yes Krystal Cota, MD  glucose blood (ACCU-CHEK GUIDE) test strip Use as instructed 4 x daily 12/13/17  Yes Nida, Marella Chimes, MD  insulin aspart protamine - aspart (NOVOLOG MIX 70/30 FLEXPEN) (70-30) 100 UNIT/ML FlexPen 60 units in the a.m. And 40 units at night Patient taking differently: 70 units in the a.m. And 40 units at night 01/22/18  Yes Nida, Marella Chimes, MD  Insulin Pen Needle (B-D  ULTRAFINE III SHORT PEN) 31G X 8 MM MISC 1 each by Does not apply route as directed. 02/23/16  Yes Nida, Marella Chimes, MD  isosorbide mononitrate (IMDUR) 60 MG 24 hr tablet TAKE (1) TABLET BY MOUTH ONCE DAILY. 10/15/17  Yes Krystal Commons, MD  lisinopril (PRINIVIL,ZESTRIL) 20 MG tablet TAKE (1) TABLET BY MOUTH ONCE DAILY. 10/18/17  Yes Krystal Commons, MD  loratadine (CLARITIN) 10 MG tablet Take 10 mg by mouth daily.   Yes [provider]  meclizine (ANTIVERT) 25 MG tablet Take 25 mg by mouth 4 (four) times daily as needed for dizziness. 04/16/17  Yes Sinda Du, MD  metoprolol tartrate (LOPRESSOR) 25 MG tablet Take 1 tablet (25 mg total) by mouth 2 (two) times daily. 01/02/18  Yes Krystal Commons, MD  nitrofurantoin, macrocrystal-monohydrate, (MACROBID) 100 MG capsule Take 1 capsule (100 mg total) by  mouth 2 (two) times daily. X 7 days 04/11/18  Yes Mesner, Corene Cornea, MD  nitroGLYCERIN (NITROSTAT) 0.4 MG SL tablet Place 1 tablet (0.4 mg total) under the tongue every 5 (five) minutes as needed for chest pain. 03/08/16  Yes Krystal Commons, MD  ONETOUCH DELICA LANCETS 62Z MISC USE AS DIRECTED THREE TIMES DAILY. 03/24/18  Yes Nida, Marella Chimes, MD  polyethylene glycol (MIRALAX / GLYCOLAX) packet Take 17 g by mouth daily as needed for mild constipation. 11/10/15  Yes Krystal Cota, MD  potassium chloride SA (K-DUR,KLOR-CON) 20 MEQ tablet Take 1 tablet (20 mEq total) by mouth 2 (two) times daily. 04/11/18  Yes Mesner, Corene Cornea, MD  RANEXA 500 MG 12 hr tablet TAKE (2) TABLETS BY MOUTH TWICE DAILY. 10/15/17  Yes Krystal Commons, MD  rivaroxaban (XARELTO) 2.5 MG TABS tablet Take 1 tablet (2.5 mg total) by mouth 2 (two) times daily. 01/02/18  Yes Krystal Commons, MD  rosuvastatin (CRESTOR) 20 MG tablet Take 1 tablet (20 mg total) by mouth every evening. 08/23/15  Yes Krystal Falconer, MD  sennosides-docusate sodium (SENOKOT-S) 8.6-50 MG tablet Take 1 tablet by mouth daily as  needed for constipation.  04/05/17  Yes [provider]  torsemide (DEMADEX) 20 MG tablet TAKE 1 TABLET BY MOUTH ONCE DAILY FOR SWELLING, MAY TAKE 1 EXTRA DAILY IF SWELLING WORSENS. 10/29/17  Yes Krystal Commons, MD  traMADol (ULTRAM) 50 MG tablet Take 1 tablet (50 mg total) by mouth every 6 (six) hours as needed for moderate pain. 01/21/18  Yes Milton Ferguson, MD    Review of Systems:  Constitutional: Denies fever, chills, diaphoresis, appetite change and fatigue.  HEENT: Denies photophobia, eye pain, redness, hearing loss, ear pain, congestion, sore throat, rhinorrhea, sneezing, mouth sores, trouble swallowing, neck pain, neck stiffness and tinnitus.   Respiratory: Denies SOB, DOE, cough, chest tightness,  and wheezing.   Cardiovascular: Denies chest pain, palpitations and leg swelling.  Gastrointestinal: Denies nausea, vomiting, abdominal pain, diarrhea, constipation, blood in stool and abdominal distention.  Genitourinary: Denies dysuria, urgency, frequency, hematuria, flank pain and difficulty urinating.  Endocrine: Denies: hot or cold intolerance, sweats, changes in hair or nails, polyuria, polydipsia. Musculoskeletal: Denies myalgias, back pain, joint swelling, arthralgias and gait problem.  Skin: Denies pallor, rash and wound.  Neurological: Denies dizziness, seizures, syncope, weakness, light-headedness, numbness and headaches.  Hematological: Denies adenopathy. Easy bruising, personal or family bleeding history  Psychiatric/Behavioral: Denies suicidal ideation, mood changes, confusion, nervousness, sleep disturbance and agitation    Physical Exam: Vitals:   04/19/18 1100 04/19/18 1130 04/19/18 1200 04/19/18 1302  BP: (!) 181/82 (!) 191/77  (!) 190/75  Pulse: 73 71 72 75  Resp: 18 17 (!) 21 18  Temp:      TempSrc:      SpO2: 100% 100% 100% 100%  Weight:      Height:         Constitutional: NAD, calm, comfortable Eyes: PERRL, lids and conjunctivae normal ENMT:  Mucous membranes are moist. Posterior pharynx clear of any exudate or lesions.Normal dentition.  Neck: normal, supple, no masses, no thyromegaly Respiratory: clear to auscultation bilaterally, no wheezing, no crackles. Normal respiratory effort. No accessory muscle use.  Cardiovascular: Regular rate and rhythm, no murmurs / rubs / gallops. No extremity edema. 2+ pedal pulses. No carotid bruits.  Abdomen: no tenderness, no masses palpated. No hepatosplenomegaly. Bowel sounds positive.  Musculoskeletal: no clubbing / cyanosis. No joint deformity upper and lower extremities. Good ROM, no contractures. Normal  muscle tone.  Skin: no rashes, lesions, ulcers. No induration Neurologic: CN 2-12 grossly intact. Sensation intact, DTR normal. Strength 5/5 in all 4.  Psychiatric: Normal judgment and insight. Alert and oriented x 3. Normal mood.    Labs on Admission: I have personally reviewed the following labs and imaging studies  CBC: Recent Labs  Lab 04/19/18 0834  WBC 7.4  NEUTROABS 4.5  HGB 11.3*  HCT 36.6  MCV 76.3*  PLT 409   Basic Metabolic Panel: Recent Labs  Lab 04/19/18 0834  NA 138  K 4.1  CL 97*  CO2 31  GLUCOSE 325*  BUN 25*  CREATININE 2.28*  CALCIUM 9.1   GFR: Estimated Creatinine Clearance: 21.5 mL/min (A) (by C-G formula based on SCr of 2.28 mg/dL (H)). Liver Function Tests: Recent Labs  Lab 04/19/18 0834  AST 29  ALT 26  ALKPHOS 51  BILITOT 0.5  PROT 6.7  ALBUMIN 3.4*   No results for input(s): LIPASE, AMYLASE in the last 168 hours. No results for input(s): AMMONIA in the last 168 hours. Coagulation Profile: No results for input(s): INR, PROTIME in the last 168 hours. Cardiac Enzymes: Recent Labs  Lab 04/19/18 0834 04/19/18 0957  CKTOTAL  --  518*  TROPONINI 0.04*  --    BNP (last 3 results) No results for input(s): PROBNP in the last 8760 hours. HbA1C: No results for input(s): HGBA1C in the last 72 hours. CBG: No results for input(s): GLUCAP  in the last 168 hours. Lipid Profile: No results for input(s): CHOL, HDL, LDLCALC, TRIG, CHOLHDL, LDLDIRECT in the last 72 hours. Thyroid Function Tests: No results for input(s): TSH, T4TOTAL, FREET4, T3FREE, THYROIDAB in the last 72 hours. Anemia Panel: No results for input(s): VITAMINB12, FOLATE, FERRITIN, TIBC, IRON, RETICCTPCT in the last 72 hours. Urine analysis:    Component Value Date/Time   COLORURINE YELLOW 04/19/2018 0801   APPEARANCEUR CLEAR 04/19/2018 0801   LABSPEC 1.008 04/19/2018 0801   PHURINE 5.0 04/19/2018 0801   GLUCOSEU >=500 (A) 04/19/2018 0801   HGBUR NEGATIVE 04/19/2018 0801   HGBUR negative 04/14/2009 1408   BILIRUBINUR NEGATIVE 04/19/2018 0801   KETONESUR NEGATIVE 04/19/2018 0801   PROTEINUR 100 (A) 04/19/2018 0801   UROBILINOGEN 0.2 03/05/2014 1106   NITRITE NEGATIVE 04/19/2018 0801   LEUKOCYTESUR NEGATIVE 04/19/2018 0801   Sepsis Labs: @LABRCNTIP (procalcitonin:4,lacticidven:4) )No results found for this or any previous visit (from the past 240 hour(s)).   Radiological Exams on Admission: Dg Chest 2 View  Result Date: 04/19/2018 CLINICAL DATA:  Fall. EXAM: CHEST - 2 VIEW COMPARISON:  Apr 11, 2018 FINDINGS: The heart size and mediastinal contours are within normal limits. Both lungs are clear. The visualized skeletal structures are unremarkable. IMPRESSION: No active cardiopulmonary disease. Electronically Signed   By: Dorise Bullion III M.D   On: 04/19/2018 09:11   Dg Lumbar Spine Complete  Result Date: 04/19/2018 CLINICAL DATA:  Fall with subsequent low back pain. EXAM: LUMBAR SPINE - COMPLETE 4+ VIEW COMPARISON:  CT 12/12/2015 FINDINGS: Five lumbar type vertebral bodies show normal alignment. Question mild superior endplate fracture at L4. This is not definite. No other fracture seen or suspected. Chronic disc space narrowing in the lower lumbar spine. Chronic lower lumbar facet arthritis. Aortic atherosclerosis. Right renal stone. IMPRESSION: Question  minor superior endplate fracture at L4. This is not definite. Ordinary lower lumbar degenerative changes otherwise. Electronically Signed   By: Nelson [name] M.D.   On: 04/19/2018 09:16   Ct Head Wo Contrast  Result  Date: 04/19/2018 CLINICAL DATA:  Fall this morning EXAM: CT HEAD WITHOUT CONTRAST TECHNIQUE: Contiguous axial images were obtained from the base of the skull through the vertex without intravenous contrast. COMPARISON:  Head CT dated 04/16/2018. FINDINGS: Brain: Ventricles are stable in size and configuration. Confluent chronic small vessel ischemic changes again noted within the bilateral periventricular and subcortical white matter regions. No mass, hemorrhage, edema or other evidence of acute parenchymal abnormality. No extra-axial hemorrhage. Vascular: Insert atherosclerosis. Skull: Normal. Negative for fracture or focal lesion. Sinuses/Orbits: No acute finding. Other: None. IMPRESSION: 1. No acute findings. No intracranial mass, hemorrhage or edema. No skull fracture. 2. Chronic small vessel ischemic changes throughout the white matter. Electronically Signed   By: Franki Cabot M.D.   On: 04/19/2018 09:03    EKG: Independently reviewed.  Normal sinus rhythm, isolated PAC, wavy baseline makes interpreting ST and T wave changes difficult.  Assessment/Plan Principal Problem:   Syncope Active Problems:   Mixed hyperlipidemia   CKD (chronic kidney disease) stage 3, GFR 30-59 ml/min (HCC)   Essential hypertension   Type 2 diabetes mellitus with stage 4 chronic kidney disease, with long-term current use of insulin (HCC)   Chronic diastolic CHF (congestive heart failure) (HCC)   Orthostasis   Rhabdomyolysis    Syncopal event -Etiology unclear, but likely due to orthostasis. -For now we will hold blood pressure medications, give some IV fluids as she does appear clinically dry. -Monitor on telemetry to observe for arrhythmias, I do not auscultate a systolic ejection murmur on exam, as  such do not believe we need to do echo to rule out aortic stenosis.  She has no focal findings which makes CVA unlikely, she did not have biting of the tongue or loss of bowel or bladder function so I also doubt seizure disorder plus she has no history of this.  Rhabdomyolysis -Patient probably spent a few hours on the floor as she remembers it being dark when she try to get out of bed and when she came to it was already Sunday. -Give IV fluids at 75 cc an hour and consideration to her congestive heart failure. -She has stage IV chronic kidney disease but her creatinine remains at her baseline of around 2.3-2.4.  Orthostasis -This is likely the cause of the syncopal event. -Check daily orthostatic vital signs. -Blood pressure meds on hold, given gentle IV fluids.  Chronic diastolic heart failure -Compensated, in fact looks a little dry.  Stage III-IV chronic kidney disease -Creatinine at baseline of around 2.2-2.4.  Hyperlipidemia -Continue Zetia.  COPD -At baseline, no shortness of breath, no wheezing on lung auscultation, is on 2 L of oxygen at home.  Coronary artery disease -She recently saw Dr. Bronson Ing with cardiology on May 7.  At that time she was having stable angina that resolved after addition of metoprolol.  He recommended continuation of Ranexa, aspirin, Zetia, Imdur and Crestor.  He also recommended continuing low-dose Xarelto 2.5 mg twice daily, which is the new coronary artery disease dosing.   DVT prophylaxis: On Xarelto Code Status: Full code Family Communication: Patient only Disposition Plan: Hope for discharge home tomorrow Consults called: None Admission status: It is my clinical opinion that referral for OBSERVATION is reasonable and necessary in this patient based on the above information provided. The aforementioned taken together are felt to place the patient at high risk for further clinical deterioration. However it is anticipated that the patient may be  medically stable for discharge from the hospital within  24 to 48 hours.      Time Spent: 85 minutes  Braylin Xu Isaac Bliss MD [facility] Pager [phone]  If 7PM-7AM, please contact night-coverage [email or web address] Password [facility]  04/19/2018, 4:22 PM

## 2018-04-19 NOTE — ED Notes (Signed)
Bladder scan: 877 mL

## 2018-04-19 NOTE — ED Notes (Signed)
CRITICAL VALUE ALERT  Critical Value:  Troponin 0.04  Date & Time Notied:  04/19/18 1798  Provider Notified: Notified Dr. Thurnell Garbe   Orders Received/Actions taken: No orders yet

## 2018-04-19 NOTE — ED Provider Notes (Signed)
[facility] EMERGENCY DEPARTMENT Provider Note   CSN: [id number] Arrival date & time: 04/19/18  0741     History   Chief Complaint Chief Complaint  Patient presents with  . Fall  . Loss of Consciousness    HPI Krystal [name] is a 82 y.o. female.   Fall   Loss of Consciousness     Pt was seen at 0755. Per pt, c/o sudden onset and resolution of one episode of syncope that occurred this morning. Pt states she was walking to the bathroom and "then doesn't remember." Pt states she could not get up, so she laid on the floor for an unknown period of time. Pt states she then pushed her Life Alert to get help. Pt told EMS she was having low back pain, but denies this to me. Denies neck pain, no CP/palpitations, no SOB/cough, no abd pain, no N/V/D, no focal motor weakness, no tingling/numbness in extremities.   Past Medical History:  Diagnosis Date  . Allergic rhinitis   . CAD (coronary artery disease) 2009   5 stents- #3 in RCA, #1 each in LAD and AVG  . CHF (congestive heart failure) ([place])   . CKD (chronic kidney disease) stage 3, GFR 30-59 ml/min (HCC)   . Complete heart block, transient 2014  . Diastolic dysfunction, left ventricle   . DJD (degenerative joint disease)   . Emphysema lung (HCC)    2L N/C continuously  . Essential hypertension   . GERD (gastroesophageal reflux disease)   . Glaucoma   . Gout   . History of uterine cancer   . HOH (hard of hearing)   . Hyperlipidemia   . MI (myocardial infarction) ([place]) 1998  . Microcytic anemia    History of occult blood in stool  . Myalgia   . Osteoarthritis   . Palpitations   . Personal history of colonic polyps   . Sleep apnea    CPAP  . Type II diabetes mellitus with nephropathy [facility])     Patient Active Problem List   Diagnosis Date Noted  . Hypertensive crisis 11/08/2015  . Acute on chronic diastolic CHF (congestive heart failure) ([place]) 11/08/2015  . Anemia, unspecified 11/08/2015  . Syncope 08/19/2015  .  Chest pain 08/19/2015  . Anemia 06/08/2015  . Hemorrhoids with complication 28/36/6294  . Chronic diastolic heart failure ([place]) 04/15/2015  . Essential hypertension 04/15/2015  . Chronic respiratory failure with hypoxia ([place]) 04/15/2015  . COPD with chronic bronchitis ([place]) 04/15/2015  . Near syncope 04/15/2015  . Type 2 diabetes mellitus with stage 4 chronic kidney disease, with long-term current use of insulin ([place]) 04/15/2015  . Dizziness and giddiness 04/14/2015  . Dizziness 04/14/2015  . Abnormal involuntary movements 04/12/2015  . Postmenopausal vaginal bleeding 01/18/2014  . Leg edema 08/15/2013  . Complete heart block, transiet 01/26/2013  . CKD (chronic kidney disease) stage 3, GFR 30-59 ml/min (HCC) 09/18/2012  . OBESITY 05/20/2009  . PERIPHERAL NEUROPATHY 05/20/2009  . NEVUS, ATYPICAL 05/06/2009  . HYPERTENSION, PULMONARY - partially due to LV diastolic dysfunction 76/54/6503  . BRONCHITIS, CHRONIC 12/16/2008  . DIASTOLIC DYSFUNCTION, normal LVF 04/30/2008  . Obstructive sleep apnea 04/30/2008  . Deficiency anemia 09/23/2007  . [place] DISEASE, LUMBOSACRAL SPINE 04/08/2007  . Mixed hyperlipidemia 10/29/2006  . Coronary atherosclerosis 10/29/2006  . ALLERGIC RHINITIS 10/29/2006  . GERD 10/29/2006  . CONSTIPATION, OTHER 10/29/2006  . OSTEOARTHRITIS 10/29/2006  . COLONIC POLYPS, HX OF 10/29/2006    Past Surgical History:  Procedure Laterality  Date  . ABDOMINAL HYSTERECTOMY  04/2010   Uterine cancer,TAHBSO  . CERVICAL DISCECTOMY     L5 left/hemilminectomy  . COLONOSCOPY  09/2006   Int hemmorhoids, COMPLICATED BY CARDIOPULMONARY COMPLICATIONS  . CORONARY ANGIOPLASTY  1/99, 1/07, 1/08, 4/09   5 cardiac stents total  . ECTOPIC PREGNANCY SURGERY    . FOOT SURGERY     Left and right for callous  . LEFT AND RIGHT HEART CATHETERIZATION WITH CORONARY ANGIOGRAM N/A 09/12/2012   Procedure: LEFT AND RIGHT HEART CATHETERIZATION WITH CORONARY ANGIOGRAM;  Surgeon:  Sanda Klein, MD;  Location: [facility] CATH LAB;  Service: Cardiovascular;  Laterality: N/A;  . POLYPECTOMY  10/22/2011   Internal hemorrhoids/sessile polyp     OB History   None      Home Medications    Prior to Admission medications   Medication Sig Start Date End Date Taking? Authorizing Provider  ACCU-CHEK FASTCLIX LANCETS MISC USE AS DIRECTED TWICE DAILY. 12/20/17   Cassandria Anger, MD  aspirin 81 MG EC tablet Take 81 mg by mouth every morning.     [provider]  BD PEN NEEDLE NANO U/F 32G X 4 MM MISC USE AS DIRECTED TWICE DAILY. 06/24/17   Cassandria Anger, MD  budesonide-formoterol (SYMBICORT) 160-4.5 MCG/ACT inhaler Inhale 2 puffs into the lungs 2 (two) times daily.    [provider]  calcitRIOL (ROCALTROL) 0.25 MCG capsule TAKE 1 CAPSULE BY MOUTH ONCE A DAY. 02/05/18   [name], MD  Continuous Blood Gluc Sensor (FREESTYLE LIBRE SENSOR SYSTEM) MISC Use one sensor every 10 days. 06/20/17   Cassandria Anger, MD  ergocalciferol (VITAMIN D2) 50000 units capsule Take 50,000 Units by mouth once a week.    [provider]  ezetimibe (ZETIA) 10 MG tablet TAKE 1 TABLET BY MOUTH EVERY MORNING. 10/18/17   [name], MD  famotidine (PEPCID) 20 MG tablet Take 1 tablet (20 mg total) by mouth daily. 08/23/15   Orvan Falconer, MD  Fluticasone-Salmeterol (ADVAIR DISKUS) 250-50 MCG/DOSE AEPB Inhale 1 puff into the lungs 2 (two) times daily. 11/10/15   Samuella Cota, MD  glucose blood (ACCU-CHEK GUIDE) test strip Use as instructed 4 x daily 12/13/17   Cassandria Anger, MD  insulin aspart protamine - aspart (NOVOLOG MIX 70/30 FLEXPEN) (70-30) 100 UNIT/ML FlexPen 60 units in the a.m. And 40 units at night Patient taking differently: 70 units in the a.m. And 40 units at night 01/22/18   Cassandria Anger, MD  Insulin Pen Needle (B-D ULTRAFINE III SHORT PEN) 31G X 8 MM MISC 1 each by Does not apply route as directed. 02/23/16   Cassandria Anger, MD  isosorbide mononitrate (IMDUR) 60 MG 24 hr tablet TAKE (1) TABLET BY MOUTH ONCE DAILY. 10/15/17   [name], MD  lisinopril (PRINIVIL,ZESTRIL) 20 MG tablet TAKE (1) TABLET BY MOUTH ONCE DAILY. 10/18/17   [name], MD  loratadine (CLARITIN) 10 MG tablet Take 10 mg by mouth daily.    [provider]  meclizine (ANTIVERT) 25 MG tablet Take 25 mg by mouth 4 (four) times daily as needed for dizziness. 04/16/17   Sinda Du, MD  metoprolol tartrate (LOPRESSOR) 25 MG tablet Take 1 tablet (25 mg total) by mouth 2 (two) times daily. 01/02/18   [name], MD  nitrofurantoin, macrocrystal-monohydrate, (MACROBID) 100 MG capsule Take 1 capsule (100 mg total) by mouth 2 (two) times daily. X 7 days 04/11/18   Mesner, Corene Cornea, MD  nitroGLYCERIN (NITROSTAT) 0.4 MG SL tablet Place 1 tablet (0.4 mg total) under the tongue every 5 (five) minutes as needed for chest pain. 03/08/16   [name], MD  ONETOUCH DELICA LANCETS 60F MISC USE AS DIRECTED THREE TIMES DAILY. 03/24/18   Cassandria Anger, MD  polyethylene glycol (MIRALAX / Floria Raveling) packet Take 17 g by mouth daily as needed for mild constipation. 11/10/15   Samuella Cota, MD  potassium chloride SA (K-DUR,KLOR-CON) 20 MEQ tablet Take 1 tablet (20 mEq total) by mouth 2 (two) times daily. 04/11/18   Mesner, Corene Cornea, MD  RANEXA 500 MG 12 hr tablet TAKE (2) TABLETS BY MOUTH TWICE DAILY. 10/15/17   [name], MD  rivaroxaban (XARELTO) 2.5 MG TABS tablet Take 1 tablet (2.5 mg total) by mouth 2 (two) times daily. 01/02/18   [name], MD  rosuvastatin (CRESTOR) 20 MG tablet Take 1 tablet (20 mg total) by mouth every evening. 08/23/15   Orvan Falconer, MD  sennosides-docusate sodium (SENOKOT-S) 8.6-50 MG tablet Take 1 tablet by mouth daily as needed for constipation.  04/05/17   [provider]  torsemide (DEMADEX) 20 MG tablet TAKE 1 TABLET BY MOUTH ONCE DAILY FOR SWELLING,  MAY TAKE 1 EXTRA DAILY IF SWELLING WORSENS. 10/29/17   [name], MD  traMADol (ULTRAM) 50 MG tablet Take 1 tablet (50 mg total) by mouth every 6 (six) hours as needed for moderate pain. 01/21/18   Milton Ferguson, MD    Family History Family History  Problem Relation Age of Onset  . Anesthesia problems Neg Hx   . Hypotension Neg Hx   . Malignant hyperthermia Neg Hx   . Pseudochol deficiency Neg Hx     Social History Social History   Tobacco Use  . Smoking status: Former Smoker    Packs/day: 0.40    Years: 51.00    Pack years: 20.40    Types: Cigarettes    Start date: 04/22/1955    Last attempt to quit: 10/18/2006    Years since quitting: 11.5  . Smokeless tobacco: Never Used  . Tobacco comment: quit about 4 yrs ago  Substance Use Topics  . Alcohol use: No    Alcohol/week: 0.0 oz  . Drug use: No     Allergies   Morphine; Penicillins; and Shellfish allergy   Review of Systems Review of Systems  Cardiovascular: Positive for syncope.  ROS: Statement: All systems negative except as marked or noted in the HPI; Constitutional: Negative for fever and chills. ; ; Eyes: Negative for eye pain, redness and discharge. ; ; ENMT: Negative for ear pain, hoarseness, nasal congestion, sinus pressure and sore throat. ; ; Cardiovascular: Negative for chest pain, palpitations, diaphoresis, dyspnea and peripheral edema. ; ; Respiratory: Negative for cough, wheezing and stridor. ; ; Gastrointestinal: Negative for nausea, vomiting, diarrhea, abdominal pain, blood in stool, hematemesis, jaundice and rectal bleeding. . ; ; Genitourinary: Negative for dysuria, flank pain and hematuria. ; ; Musculoskeletal: +LBP. Negative for neck pain. Negative for swelling and deformity.; ; Skin: Negative for pruritus, rash, abrasions, blisters, bruising and skin lesion.; ; Neuro: Negative for headache, lightheadedness and neck stiffness. Negative for extremity weakness, paresthesias, involuntary movement,  seizure and +syncope, weakness.       Physical Exam Updated Vital Signs BP (!) 160/97   Pulse 74   Temp (!) 97.5 F (36.4 C) (Oral)   Resp 19   Ht 5\' 7"  (1.702 m)   Wt 77.6 kg (171 lb)  SpO2 100%   BMI 26.78 kg/m    10:31 Orthostatic Vital Signs VB  Orthostatic Lying   BP- Lying: 165/81   Pulse- Lying: 74       Orthostatic Sitting  BP- Sitting: 181/67   Pulse- Sitting: 73       Orthostatic Standing at 0 minutes  BP- Standing at 0 minutes: 159/90   Pulse- Standing at 0 minutes: 77      Physical Exam 0800: Physical examination:  Nursing notes reviewed; Vital signs and O2 SAT reviewed;  Constitutional: Well developed, Well nourished, Well hydrated, In no acute distress; Head:  Normocephalic, atraumatic; Eyes: EOMI, PERRL, No scleral icterus; ENMT: Mouth and pharynx normal, Mucous membranes moist; Neck: Supple, Full range of motion, No lymphadenopathy; Cardiovascular: Regular rate and rhythm, No gallop; Respiratory: Breath sounds clear & equal bilaterally, No wheezes.  Speaking full sentences with ease, Normal respiratory effort/excursion; Chest: Nontender, Movement normal; Abdomen: Soft, Nontender, Nondistended, Normal bowel sounds; Genitourinary: No CVA tenderness; Spine:  No midline CS, TS, LS tenderness.;; Extremities: Peripheral pulses normal, No tenderness, No edema, No calf edema or asymmetry.; Neuro: AA&Ox3, confused regarding events. Major CN grossly intact. No facial droop.  Speech clear. Grips equal. Strength 5/5 equal bilat UE's and LE's. No drift. No gross focal motor or sensory deficits in extremities.; Skin: Color normal, Warm, Dry.   ED Treatments / Results  Labs (all labs ordered are listed, but only abnormal results are displayed)   EKG EKG Interpretation  Date/Time:  Saturday April 19 2018 07:52:45 EDT Ventricular Rate:  75 PR Interval:    QRS Duration: 103 QT Interval:  444 QTC Calculation: 496 R Axis:   16 Text Interpretation:  Sinus rhythm  Atrial premature complex Prolonged PR interval Anterior infarct, old Nonspecific T abnormalities, lateral leads Baseline wander When compared with ECG of 04/11/2018 No significant change was found Confirmed by Francine Graven [id number]) on 04/19/2018 8:03:06 AM   Radiology   Procedures Procedures (including critical care time)  Medications Ordered in ED Medications - No data to display   Initial Impression / Assessment and Plan / ED Course  I have reviewed the triage vital signs and the nursing notes.  Pertinent labs & imaging results that were available during my care of the patient were reviewed by me and considered in my medical decision making (see chart for details).  MDM Reviewed: previous chart, nursing note and vitals Reviewed previous: labs and ECG Interpretation: labs, ECG, x-ray and CT scan     Results for orders placed or performed during the hospital encounter of 04/19/18  Comprehensive metabolic panel  Result Value Ref Range   Sodium 138 135 - 145 mmol/L   Potassium 4.1 3.5 - 5.1 mmol/L   Chloride 97 (L) 101 - 111 mmol/L   CO2 31 22 - 32 mmol/L   Glucose, Bld 325 (H) 65 - 99 mg/dL   BUN 25 (H) 6 - 20 mg/dL   Creatinine, Ser 2.28 (H) 0.44 - 1.00 mg/dL   Calcium 9.1 8.9 - 10.3 mg/dL   Total Protein 6.7 6.5 - 8.1 g/dL   Albumin 3.4 (L) 3.5 - 5.0 g/dL   AST 29 15 - 41 U/L   ALT 26 14 - 54 U/L   Alkaline Phosphatase 51 38 - 126 U/L   Total Bilirubin 0.5 0.3 - 1.2 mg/dL   GFR calc non Af Amer 19 (L) >60 mL/min   GFR calc Af Amer 22 (L) >60 mL/min   Anion gap 10 5 -  15  Troponin I  Result Value Ref Range   Troponin I 0.04 (HH) <0.03 ng/mL  Lactic acid, plasma  Result Value Ref Range   Lactic Acid, Venous 1.1 0.5 - 1.9 mmol/L  Lactic acid, plasma  Result Value Ref Range   Lactic Acid, Venous 0.8 0.5 - 1.9 mmol/L  CBC with Differential  Result Value Ref Range   WBC 7.4 4.0 - 10.5 K/uL   RBC 4.80 3.87 - 5.11 MIL/uL   Hemoglobin 11.3 (L) 12.0 - 15.0 g/dL    HCT 36.6 36.0 - 46.0 %   MCV 76.3 (L) 78.0 - 100.0 fL   MCH 23.5 (L) 26.0 - 34.0 pg   MCHC 30.9 30.0 - 36.0 g/dL   RDW 15.8 (H) 11.5 - 15.5 %   Platelets 232 150 - 400 K/uL   Neutrophils Relative % 61 %   Neutro Abs 4.5 1.7 - 7.7 K/uL   Lymphocytes Relative 25 %   Lymphs Abs 1.9 0.7 - 4.0 K/uL   Monocytes Relative 10 %   Monocytes Absolute 0.8 0.1 - 1.0 K/uL   Eosinophils Relative 4 %   Eosinophils Absolute 0.3 0.0 - 0.7 K/uL   Basophils Relative 0 %   Basophils Absolute 0.0 0.0 - 0.1 K/uL  Urinalysis, Routine w reflex microscopic  Result Value Ref Range   Color, Urine YELLOW YELLOW   APPearance CLEAR CLEAR   Specific Gravity, Urine 1.008 1.005 - 1.030   pH 5.0 5.0 - 8.0   Glucose, UA >=500 (A) NEGATIVE mg/dL   Hgb urine dipstick NEGATIVE NEGATIVE   Bilirubin Urine NEGATIVE NEGATIVE   Ketones, ur NEGATIVE NEGATIVE mg/dL   Protein, ur 100 (A) NEGATIVE mg/dL   Nitrite NEGATIVE NEGATIVE   Leukocytes, UA NEGATIVE NEGATIVE   RBC / HPF 0-5 0 - 5 RBC/hpf   WBC, UA 0-5 0 - 5 WBC/hpf   Bacteria, UA RARE (A) NONE SEEN   Mucus PRESENT    Hyaline Casts, UA PRESENT   CK  Result Value Ref Range   Total CK 518 (H) 38 - 234 U/L   Dg Chest 2 View Result Date: 04/19/2018 CLINICAL DATA:  Fall. EXAM: CHEST - 2 VIEW COMPARISON:  Apr 11, 2018 FINDINGS: The heart size and mediastinal contours are within normal limits. Both lungs are clear. The visualized skeletal structures are unremarkable. IMPRESSION: No active cardiopulmonary disease. Electronically Signed   By: Dorise Bullion III M.D   On: 04/19/2018 09:11   Dg Lumbar Spine Complete Result Date: 04/19/2018 CLINICAL DATA:  Fall with subsequent low back pain. EXAM: LUMBAR SPINE - COMPLETE 4+ VIEW COMPARISON:  CT 12/12/2015 FINDINGS: Five lumbar type vertebral bodies show normal alignment. Question mild superior endplate fracture at L4. This is not definite. No other fracture seen or suspected. Chronic disc space narrowing in the lower lumbar  spine. Chronic lower lumbar facet arthritis. Aortic atherosclerosis. Right renal stone. IMPRESSION: Question minor superior endplate fracture at L4. This is not definite. Ordinary lower lumbar degenerative changes otherwise. Electronically Signed   By: Nelson Chimes M.D.   On: 04/19/2018 09:16   Ct Head Wo Contrast Result Date: 04/19/2018 CLINICAL DATA:  Fall this morning EXAM: CT HEAD WITHOUT CONTRAST TECHNIQUE: Contiguous axial images were obtained from the base of the skull through the vertex without intravenous contrast. COMPARISON:  Head CT dated 04/16/2018. FINDINGS: Brain: Ventricles are stable in size and configuration. Confluent chronic small vessel ischemic changes again noted within the bilateral periventricular and subcortical white matter regions. No  mass, hemorrhage, edema or other evidence of acute parenchymal abnormality. No extra-axial hemorrhage. Vascular: Insert atherosclerosis. Skull: Normal. Negative for fracture or focal lesion. Sinuses/Orbits: No acute finding. Other: None. IMPRESSION: 1. No acute findings. No intracranial mass, hemorrhage or edema. No skull fracture. 2. Chronic small vessel ischemic changes throughout the white matter. Electronically Signed   By: Franki Cabot M.D.   On: 04/19/2018 09:03    1130:  Pt told ED Tech she "saw spots" when she moved positions for orthostatic VS. CK mildly elevated also. Judicious IVF given. BUN/Cr and H/H per baseline. Dx and testing d/w pt.  Questions answered.  Verb understanding, agreeable to admit. T/C returned from [facility] Dr. Jerilee Hoh, case discussed, including:  HPI, pertinent PM/SHx, VS/PE, dx testing, ED course and treatment:  Agreeable to admit.       Final Clinical Impressions(s) / ED Diagnoses   Final diagnoses:  None    ED Discharge Orders    None       Francine Graven, DO 04/20/18 0920

## 2018-04-19 NOTE — ED Triage Notes (Signed)
Pt fell at home this morning around 0630 and was unable to get up.  Pt was able to push her Life Alert to get help.  Pt c/o lower back pain.  Pt does not remember falling, but was in the bathroom and was able to crawl out into hallway.

## 2018-04-19 NOTE — ED Notes (Signed)
Patient placed on bedpan.

## 2018-04-20 DIAGNOSIS — I5032 Chronic diastolic (congestive) heart failure: Secondary | ICD-10-CM | POA: Diagnosis not present

## 2018-04-20 DIAGNOSIS — R55 Syncope and collapse: Secondary | ICD-10-CM | POA: Diagnosis not present

## 2018-04-20 DIAGNOSIS — N183 Chronic kidney disease, stage 3 (moderate): Secondary | ICD-10-CM | POA: Diagnosis not present

## 2018-04-20 LAB — URINE CULTURE: CULTURE: NO GROWTH

## 2018-04-20 LAB — BASIC METABOLIC PANEL
Anion gap: 7 (ref 5–15)
BUN: 23 mg/dL — AB (ref 6–20)
CO2: 31 mmol/L (ref 22–32)
Calcium: 8.8 mg/dL — ABNORMAL LOW (ref 8.9–10.3)
Chloride: 101 mmol/L (ref 101–111)
Creatinine, Ser: 1.95 mg/dL — ABNORMAL HIGH (ref 0.44–1.00)
GFR calc Af Amer: 27 mL/min — ABNORMAL LOW (ref >60)
GFR, EST NON AFRICAN AMERICAN: 23 mL/min — AB (ref >60)
GLUCOSE: 147 mg/dL — AB (ref 65–99)
POTASSIUM: 3.3 mmol/L — AB (ref 3.5–5.1)
Sodium: 139 mmol/L (ref 135–145)

## 2018-04-20 LAB — CBC
HEMATOCRIT: 31.5 % — AB (ref 36.0–46.0)
Hemoglobin: 9.8 g/dL — ABNORMAL LOW (ref 12.0–15.0)
MCH: 23.7 pg — AB (ref 26.0–34.0)
MCHC: 31.1 g/dL (ref 30.0–36.0)
MCV: 76.1 fL — ABNORMAL LOW (ref 78.0–100.0)
Platelets: 227 10*3/uL (ref 150–400)
RBC: 4.14 MIL/uL (ref 3.87–5.11)
RDW: 15.8 % — AB (ref 11.5–15.5)
WBC: 7.2 10*3/uL (ref 4.0–10.5)

## 2018-04-20 LAB — CK: CK TOTAL: 571 U/L — AB (ref 38–234)

## 2018-04-20 LAB — GLUCOSE, CAPILLARY
GLUCOSE-CAPILLARY: 189 mg/dL — AB (ref 65–99)
GLUCOSE-CAPILLARY: 71 mg/dL (ref 65–99)
GLUCOSE-CAPILLARY: 130 mg/dL — AB (ref 65–99)
Glucose-Capillary: 137 mg/dL — ABNORMAL HIGH (ref 65–99)
Glucose-Capillary: 57 mg/dL — ABNORMAL LOW (ref 65–99)

## 2018-04-20 NOTE — Progress Notes (Signed)
PT Cancellation Note  Patient Details Name: Krystal [name] MRN: [id number] DOB: [birth year]   Cancelled Treatment:    Reason Eval/Treat Not Completed: Medical issues which prohibited therapy  Per H&P Note: by Isaac Bliss, Rayford Halsted, MD  HPI: "Krystal [name] is a 82 y.o. female with multiple medical comorbidities including stage IV chronic kidney disease, insulin-dependent diabetes, GERD, hypertension, coronary artery disease, chronic diastolic heart failure, hyperlipidemia among other issues who presents to the hospital today after a syncopal event.  She states she went to get out of bed early this morning and the next thing she remembers is waking up hours later when the son was up.  She distinctly remembers it being dark when she first got out of bed.  She was unable to pull herself back up into bed and so she pressed her life alert button.  EMS brought her to the hospital for evaluation.  In the ED she was found to be orthostatic although not hypotensive, labs show a creatinine of 2.28 which is around her baseline, a troponin of 0.04, total CK of 518 at the normal lactic acid of 0.8.  Chest x-ray without acute cardiopulmonary changes.  Lumbar spine x-ray shows questionable minor superior endplate fracture at L4.  CT scan of the head without acute findings, EKG shows normal sinus rhythm with PAC, wavy baseline makes ST T changes difficult to interpret.  Admission has been requested for management of syncopal event."  Chart review reveals significant cardiac history and recent lab values show elevated CK trending up from 04/19/18 to 04/20/18 and elevated troponin levels. Will hold PT eval until levels begin trending down. Will follow up at later date/time when patient is medically appropriate.   Cardiac Panel (last 3 results) Recent Labs    04/19/18 0834 04/19/18 0957 04/20/18 0637  CKTOTAL  --  518* 571*  TROPONINI 0.04*  --   --      Kipp Brood, PT, DPT Physical Therapist  with [facility]  04/20/2018 12:23 PM

## 2018-04-20 NOTE — Progress Notes (Signed)
PROGRESS NOTE    Krystal [name]  NFA:[id number] DOB: 03/30/1936 DOA: 04/19/2018 PCP: Iona Beard, MD     Brief Narrative:  82 year old woman admitted from home on 6/1 following a syncopal event.  In the ED she was noted to be slightly orthostatic, with mildly elevated CK levels and admission was requested for further evaluation and management.   Assessment & Plan:   Principal Problem:   Syncope Active Problems:   Mixed hyperlipidemia   CKD (chronic kidney disease) stage 3, GFR 30-59 ml/min (HCC)   Essential hypertension   Type 2 diabetes mellitus with stage 4 chronic kidney disease, with long-term current use of insulin (HCC)   Chronic diastolic CHF (congestive heart failure) (HCC)   Orthostasis   Rhabdomyolysis   Syncopal event -I suspect due to orthostasis and mild dehydration. -For some reason PT evaluation requested today was canceled due to patient being "medically unstable", this was not discussed with me at any time. -No focal abnormalities to suggest CVA, hence MRI has not been ordered.  Mild rhabdomyolysis -Continue IV fluids today.  Chronic kidney disease stage III-IV -Baseline creatinine around 2.2-2.4, creatinine is better than baseline today at 1.95 after IV fluids.  Hyperlipidemia -Continue Zetia   COPD -At baseline, no shortness of breath, no wheezing on lung auscultation, is on 2 L of oxygen at home.  Coronary artery disease -Stable, no chest pain, continue Ranexa, aspirin, Zetia, imdur and Crestor.   DVT prophylaxis: Xarelto Code Status: Full code Family Communication: Patient only Disposition Plan: Pending PT evaluation, anticipate discharge home tomorrow  Consultants:   None  Procedures:   None  Antimicrobials:  Anti-infectives (From admission, onward)   None       Subjective: Lying in bed, still feels a little dizzy with ambulation, denies chest pain, shortness of breath.  Objective: Vitals:   04/20/18 0806 04/20/18 1604  04/20/18 1606 04/20/18 1611  BP:  (!) 182/104 (!) 135/93 (!) 183/72  Pulse:  82 83 95  Resp:  20    Temp:  98.6 F (37 C)    TempSrc:      SpO2: 100% 100% 100% 94%  Weight:      Height:        Intake/Output Summary (Last 24 hours) at 04/20/2018 1628 Last data filed at 04/20/2018 0900 Gross per 24 hour  Intake 1010 ml  Output 750 ml  Net 260 ml   Filed Weights   04/19/18 0746  Weight: 77.6 kg (171 lb)    Examination:  General exam: Alert, awake, oriented x 3 Respiratory system: Clear to auscultation. Respiratory effort normal. Cardiovascular system:RRR. No murmurs, rubs, gallops. Gastrointestinal system: Abdomen is nondistended, soft and nontender. No organomegaly or masses felt. Normal bowel sounds heard. Central nervous system: Alert and oriented. No focal neurological deficits. Extremities: No C/C/E, +pedal pulses Skin: No rashes, lesions or ulcers Psychiatry: Judgement and insight appear normal. Mood & affect appropriate.     Data Reviewed: I have personally reviewed following labs and imaging studies  CBC: Recent Labs  Lab 04/19/18 0834 04/20/18 0637  WBC 7.4 7.2  NEUTROABS 4.5  --   HGB 11.3* 9.8*  HCT 36.6 31.5*  MCV 76.3* 76.1*  PLT 232 469   Basic Metabolic Panel: Recent Labs  Lab 04/19/18 0834 04/20/18 0637  NA 138 139  K 4.1 3.3*  CL 97* 101  CO2 31 31  GLUCOSE 325* 147*  BUN 25* 23*  CREATININE 2.28* 1.95*  CALCIUM 9.1 8.8*   GFR: Estimated  Creatinine Clearance: 25.1 mL/min (A) (by C-G formula based on SCr of 1.95 mg/dL (H)). Liver Function Tests: Recent Labs  Lab 04/19/18 0834  AST 29  ALT 26  ALKPHOS 51  BILITOT 0.5  PROT 6.7  ALBUMIN 3.4*   No results for input(s): LIPASE, AMYLASE in the last 168 hours. No results for input(s): AMMONIA in the last 168 hours. Coagulation Profile: No results for input(s): INR, PROTIME in the last 168 hours. Cardiac Enzymes: Recent Labs  Lab 04/19/18 0834 04/19/18 0957 04/20/18 0637    CKTOTAL  --  518* 571*  TROPONINI 0.04*  --   --    BNP (last 3 results) No results for input(s): PROBNP in the last 8760 hours. HbA1C: Recent Labs    04/19/18 1640  HGBA1C 11.0*   CBG: Recent Labs  Lab 04/19/18 2157 04/20/18 0016 04/20/18 0759 04/20/18 1151 04/20/18 1620  GLUCAP 143* 57* 137* 189* 71   Lipid Profile: No results for input(s): CHOL, HDL, LDLCALC, TRIG, CHOLHDL, LDLDIRECT in the last 72 hours. Thyroid Function Tests: Recent Labs    04/19/18 1640  TSH 1.783   Anemia Panel: No results for input(s): VITAMINB12, FOLATE, FERRITIN, TIBC, IRON, RETICCTPCT in the last 72 hours. Urine analysis:    Component Value Date/Time   COLORURINE YELLOW 04/19/2018 0801   APPEARANCEUR CLEAR 04/19/2018 0801   LABSPEC 1.008 04/19/2018 0801   PHURINE 5.0 04/19/2018 0801   GLUCOSEU >=500 (A) 04/19/2018 0801   HGBUR NEGATIVE 04/19/2018 0801   HGBUR negative 04/14/2009 1408   BILIRUBINUR NEGATIVE 04/19/2018 0801   KETONESUR NEGATIVE 04/19/2018 0801   PROTEINUR 100 (A) 04/19/2018 0801   UROBILINOGEN 0.2 03/05/2014 1106   NITRITE NEGATIVE 04/19/2018 0801   LEUKOCYTESUR NEGATIVE 04/19/2018 0801   Sepsis Labs: @LABRCNTIP (procalcitonin:4,lacticidven:4)  ) Recent Results (from the past 240 hour(s))  Urine culture     Status: None   Collection Time: 04/19/18  8:01 AM  Result Value Ref Range Status   Specimen Description   Final    URINE, CATHETERIZED Performed at [facility], [address], [place], [place] [address]    Special Requests   Final    NONE Performed at [facility], [address], [place], [place] [address]    Culture   Final    NO GROWTH Performed at [facility], [place] [address] [place], [place] [address]    Report Status 04/20/2018 FINAL  Final         Radiology Studies: Dg Chest 2 View  Result Date: 04/19/2018 CLINICAL DATA:  Fall. EXAM: CHEST - 2 VIEW COMPARISON:  Apr 11, 2018 FINDINGS: The heart size and mediastinal contours are  within normal limits. Both lungs are clear. The visualized skeletal structures are unremarkable. IMPRESSION: No active cardiopulmonary disease. Electronically Signed   By: Dorise Bullion III M.D   On: 04/19/2018 09:11   Dg Lumbar Spine Complete  Result Date: 04/19/2018 CLINICAL DATA:  Fall with subsequent low back pain. EXAM: LUMBAR SPINE - COMPLETE 4+ VIEW COMPARISON:  CT 12/12/2015 FINDINGS: Five lumbar type vertebral bodies show normal alignment. Question mild superior endplate fracture at L4. This is not definite. No other fracture seen or suspected. Chronic disc space narrowing in the lower lumbar spine. Chronic lower lumbar facet arthritis. Aortic atherosclerosis. Right renal stone. IMPRESSION: Question minor superior endplate fracture at L4. This is not definite. Ordinary lower lumbar degenerative changes otherwise. Electronically Signed   By: Nelson Chimes M.D.   On: 04/19/2018 09:16   Ct Head  Wo Contrast  Result Date: 04/19/2018 CLINICAL DATA:  Fall this morning EXAM: CT HEAD WITHOUT CONTRAST TECHNIQUE: Contiguous axial images were obtained from the base of the skull through the vertex without intravenous contrast. COMPARISON:  Head CT dated 04/16/2018. FINDINGS: Brain: Ventricles are stable in size and configuration. Confluent chronic small vessel ischemic changes again noted within the bilateral periventricular and subcortical white matter regions. No mass, hemorrhage, edema or other evidence of acute parenchymal abnormality. No extra-axial hemorrhage. Vascular: Insert atherosclerosis. Skull: Normal. Negative for fracture or focal lesion. Sinuses/Orbits: No acute finding. Other: None. IMPRESSION: 1. No acute findings. No intracranial mass, hemorrhage or edema. No skull fracture. 2. Chronic small vessel ischemic changes throughout the white matter. Electronically Signed   By: Franki Cabot M.D.   On: 04/19/2018 09:03        Scheduled Meds: . aspirin EC  81 mg Oral Daily  . calcitRIOL  0.25  mcg Oral Daily  . ezetimibe  10 mg Oral q morning - 10a  . famotidine  20 mg Oral Daily  . insulin aspart protamine- aspart  40 Units Subcutaneous Q supper  . insulin aspart protamine- aspart  70 Units Subcutaneous Q breakfast  . loratadine  10 mg Oral Daily  . mometasone-formoterol  2 puff Inhalation BID  . ranolazine  500 mg Oral BID  . rivaroxaban  2.5 mg Oral BID  . rosuvastatin  20 mg Oral QPM  . Vitamin D (Ergocalciferol)  50,000 Units Oral Q7 days   Continuous Infusions: . sodium chloride 75 mL/hr at 04/19/18 1624     LOS: 0 days    Time spent: 25 minutes.     Lelon Frohlich, MD [facility] Pager [phone]  If 7PM-7AM, please contact night-coverage [email or web address] Password [facility] 04/20/2018, 4:28 PM

## 2018-04-21 DIAGNOSIS — I5032 Chronic diastolic (congestive) heart failure: Secondary | ICD-10-CM | POA: Diagnosis not present

## 2018-04-21 DIAGNOSIS — N183 Chronic kidney disease, stage 3 (moderate): Secondary | ICD-10-CM | POA: Diagnosis not present

## 2018-04-21 DIAGNOSIS — R55 Syncope and collapse: Secondary | ICD-10-CM | POA: Diagnosis not present

## 2018-04-21 LAB — GLUCOSE, CAPILLARY
GLUCOSE-CAPILLARY: 98 mg/dL (ref 65–99)
Glucose-Capillary: 118 mg/dL — ABNORMAL HIGH (ref 65–99)
Glucose-Capillary: 175 mg/dL — ABNORMAL HIGH (ref 65–99)

## 2018-04-21 NOTE — Discharge Summary (Signed)
Physician Discharge Summary  Krystal [name] ZOX:[id number] DOB: [birth year] DOA: 04/19/2018  PCP: Iona Beard, MD  Admit date: 04/19/2018 Discharge date: 04/21/2018  Time spent: 45 minutes  Recommendations for Outpatient Follow-up:  -Will be discharged home today.  -Advised to follow up with PCP in 2 weeks. -SNF was recommended by PT, but without payer source could not be obtained. CM has arranged for [facility] services.  Discharge Diagnoses:  Principal Problem:   Syncope Active Problems:   Mixed hyperlipidemia   CKD (chronic kidney disease) stage 3, GFR 30-59 ml/min (HCC)   Essential hypertension   Type 2 diabetes mellitus with stage 4 chronic kidney disease, with long-term current use of insulin (HCC)   Chronic diastolic CHF (congestive heart failure) (HCC)   Orthostasis   Rhabdomyolysis   Discharge Condition: Stable and improved  Filed Weights   04/19/18 0746  Weight: 77.6 kg (171 lb)    History of present illness:   Krystal [name] is a 82 y.o. female with multiple medical comorbidities including stage IV chronic kidney disease, insulin-dependent diabetes, GERD, hypertension, coronary artery disease, chronic diastolic heart failure, hyperlipidemia among other issues who presents to the hospital today after a syncopal event.  She states she went to get out of bed early this morning and the next thing she remembers is waking up hours later when the son was up.  She distinctly remembers it being dark when she first got out of bed.  She was unable to pull herself back up into bed and so she pressed her life alert button.  EMS brought her to the hospital for evaluation.  In the ED she was found to be orthostatic although not hypotensive, labs show a creatinine of 2.28 which is around her baseline, a troponin of 0.04, total CK of 518 at the normal lactic acid of 0.8.  Chest x-ray without acute cardiopulmonary changes.  Lumbar spine x-ray shows questionable minor superior endplate  fracture at L4.  CT scan of the head without acute findings, EKG shows normal sinus rhythm with PAC, wavy baseline makes ST T changes difficult to interpret.  Admission has been requested for management of syncopal event.    Hospital Course:   Syncopal event -I suspect due to orthostasis and mild dehydration. -No focal abnormalities to suggest CVA, hence MRI has not been ordered. -PT has evaluated and is recommending SNF, however there is no payer source so home health therapies have been arranged including aide. -Patient has been fitted for TED stockings.  Mild rhabdomyolysis -Resolved with IVF, renal dysfunction resolved.  Chronic kidney disease stage III-IV -Baseline creatinine around 2.2-2.4, creatinine is better than baseline today at 1.95 after IV fluids.  Hyperlipidemia -Continue Zetia   COPD -At baseline, no shortness of breath, no wheezing on lung auscultation, is on 2 L of oxygen at home.  Coronary artery disease -Stable, no chest pain, continue Ranexa, aspirin, Zetia, imdur and Crestor.    Procedures:  None   Consultations:  None  Discharge Instructions  Discharge Instructions    Diet - low sodium heart healthy   Complete by:  As directed    Increase activity slowly   Complete by:  As directed      Allergies as of 04/21/2018      Reactions   Morphine Shortness Of Breath, Swelling   Penicillins Shortness Of Breath, Swelling   Has patient had a PCN reaction causing immediate rash, facial/tongue/throat swelling, SOB or lightheadedness with hypotension: No Has patient had a  PCN reaction causing severe rash involving mucus membranes or skin necrosis: No Has patient had a PCN reaction that required hospitalization Yes Has patient had a PCN reaction occurring within the last 10 years: No If all of the above answers are "NO", then may proceed with Cephalosporin use.   Shellfish Allergy       Medication List    STOP taking these medications     nitrofurantoin (macrocrystal-monohydrate) 100 MG capsule Commonly known as:  MACROBID   potassium chloride SA 20 MEQ tablet Commonly known as:  K-DUR,KLOR-CON   torsemide 20 MG tablet Commonly known as:  DEMADEX     TAKE these medications   ACCU-CHEK FASTCLIX LANCETS Misc USE AS DIRECTED TWICE DAILY.   ONETOUCH DELICA LANCETS 06Y Misc USE AS DIRECTED THREE TIMES DAILY.   aspirin 81 MG EC tablet Take 81 mg by mouth every morning.   budesonide-formoterol 160-4.5 MCG/ACT inhaler Commonly known as:  SYMBICORT Inhale 2 puffs into the lungs 2 (two) times daily.   calcitRIOL 0.25 MCG capsule Commonly known as:  ROCALTROL TAKE 1 CAPSULE BY MOUTH ONCE A DAY.   ergocalciferol 50000 units capsule Commonly known as:  VITAMIN D2 Take 50,000 Units by mouth once a week.   ezetimibe 10 MG tablet Commonly known as:  ZETIA TAKE 1 TABLET BY MOUTH EVERY MORNING.   famotidine 20 MG tablet Commonly known as:  PEPCID Take 1 tablet (20 mg total) by mouth daily.   Fluticasone-Salmeterol 250-50 MCG/DOSE Aepb Commonly known as:  ADVAIR DISKUS Inhale 1 puff into the lungs 2 (two) times daily.   FREESTYLE LIBRE SENSOR SYSTEM Misc Use one sensor every 10 days.   glucose blood test strip Commonly known as:  ACCU-CHEK GUIDE Use as instructed 4 x daily   insulin aspart protamine - aspart (70-30) 100 UNIT/ML FlexPen Commonly known as:  NOVOLOG MIX 70/30 FLEXPEN 60 units in the a.m. And 40 units at night What changed:  additional instructions   Insulin Pen Needle 31G X 8 MM Misc Commonly known as:  B-D ULTRAFINE III SHORT PEN 1 each by Does not apply route as directed.   BD PEN NEEDLE NANO U/F 32G X 4 MM Misc Generic drug:  Insulin Pen Needle USE AS DIRECTED TWICE DAILY.   isosorbide mononitrate 60 MG 24 hr tablet Commonly known as:  IMDUR TAKE (1) TABLET BY MOUTH ONCE DAILY.   lisinopril 20 MG tablet Commonly known as:  PRINIVIL,ZESTRIL TAKE (1) TABLET BY MOUTH ONCE DAILY.    loratadine 10 MG tablet Commonly known as:  CLARITIN Take 10 mg by mouth daily.   meclizine 25 MG tablet Commonly known as:  ANTIVERT Take 25 mg by mouth 4 (four) times daily as needed for dizziness.   metoprolol tartrate 25 MG tablet Commonly known as:  LOPRESSOR Take 1 tablet (25 mg total) by mouth 2 (two) times daily.   nitroGLYCERIN 0.4 MG SL tablet Commonly known as:  NITROSTAT Place 1 tablet (0.4 mg total) under the tongue every 5 (five) minutes as needed for chest pain.   polyethylene glycol packet Commonly known as:  MIRALAX / GLYCOLAX Take 17 g by mouth daily as needed for mild constipation.   RANEXA 500 MG 12 hr tablet Generic drug:  ranolazine TAKE (2) TABLETS BY MOUTH TWICE DAILY.   rivaroxaban 2.5 MG Tabs tablet Commonly known as:  XARELTO Take 1 tablet (2.5 mg total) by mouth 2 (two) times daily.   rosuvastatin 20 MG tablet Commonly known as:  CRESTOR Take  1 tablet (20 mg total) by mouth every evening.   sennosides-docusate sodium 8.6-50 MG tablet Commonly known as:  SENOKOT-S Take 1 tablet by mouth daily as needed for constipation.   traMADol 50 MG tablet Commonly known as:  ULTRAM Take 1 tablet (50 mg total) by mouth every 6 (six) hours as needed for moderate pain.            Durable Medical Equipment  (From admission, onward)        Start     Ordered   04/21/18 1242  For home use only DME Walker rolling  Once    Question:  Patient needs a walker to treat with the following condition  Answer:  Syncope   04/21/18 1241     Allergies  Allergen Reactions  . Morphine Shortness Of Breath and Swelling  . Penicillins Shortness Of Breath and Swelling    Has patient had a PCN reaction causing immediate rash, facial/tongue/throat swelling, SOB or lightheadedness with hypotension: No Has patient had a PCN reaction causing severe rash involving mucus membranes or skin necrosis: No Has patient had a PCN reaction that required hospitalization Yes Has  patient had a PCN reaction occurring within the last 10 years: No If all of the above answers are "NO", then may proceed with Cephalosporin use.   Marland Kitchen Shellfish Allergy    Follow-up Information    Iona Beard, MD. Schedule an appointment as soon as possible for a visit in 2 weeks.   Specialty:  Family Medicine Contact information: [address] [place] [address] [phone]        [name], MD.   Specialty:  Cardiology Contact information: [place]  [address] [phone]            The results of significant diagnostics from this hospitalization (including imaging, microbiology, ancillary and laboratory) are listed below for reference.    Significant Diagnostic Studies: Dg Chest 2 View  Result Date: 04/19/2018 CLINICAL DATA:  Fall. EXAM: CHEST - 2 VIEW COMPARISON:  Apr 11, 2018 FINDINGS: The heart size and mediastinal contours are within normal limits. Both lungs are clear. The visualized skeletal structures are unremarkable. IMPRESSION: No active cardiopulmonary disease. Electronically Signed   By: Dorise Bullion III M.D   On: 04/19/2018 09:11   Dg Chest 2 View  Result Date: 04/11/2018 CLINICAL DATA:  Confusion EXAM: CHEST - 2 VIEW COMPARISON:  January 21, 2018 FINDINGS: There is no edema or consolidation. Heart is upper normal in size with pulmonary vascularity within normal limits. No adenopathy. No evident bone lesions. IMPRESSION: No edema or consolidation. Electronically Signed   By: Lowella Grip III M.D.   On: 04/11/2018 15:12   Dg Lumbar Spine Complete  Result Date: 04/19/2018 CLINICAL DATA:  Fall with subsequent low back pain. EXAM: LUMBAR SPINE - COMPLETE 4+ VIEW COMPARISON:  CT 12/12/2015 FINDINGS: Five lumbar type vertebral bodies show normal alignment. Question mild superior endplate fracture at L4. This is not definite. No other fracture seen or suspected. Chronic disc space narrowing in the lower lumbar spine. Chronic  lower lumbar facet arthritis. Aortic atherosclerosis. Right renal stone. IMPRESSION: Question minor superior endplate fracture at L4. This is not definite. Ordinary lower lumbar degenerative changes otherwise. Electronically Signed   By: Nelson Chimes M.D.   On: 04/19/2018 09:16   Ct Head Wo Contrast  Result Date: 04/19/2018 CLINICAL DATA:  Fall this morning EXAM: CT HEAD WITHOUT CONTRAST TECHNIQUE: Contiguous axial images were  obtained from the base of the skull through the vertex without intravenous contrast. COMPARISON:  Head CT dated 04/16/2018. FINDINGS: Brain: Ventricles are stable in size and configuration. Confluent chronic small vessel ischemic changes again noted within the bilateral periventricular and subcortical white matter regions. No mass, hemorrhage, edema or other evidence of acute parenchymal abnormality. No extra-axial hemorrhage. Vascular: Insert atherosclerosis. Skull: Normal. Negative for fracture or focal lesion. Sinuses/Orbits: No acute finding. Other: None. IMPRESSION: 1. No acute findings. No intracranial mass, hemorrhage or edema. No skull fracture. 2. Chronic small vessel ischemic changes throughout the white matter. Electronically Signed   By: Franki Cabot M.D.   On: 04/19/2018 09:03   Ct Head Wo Contrast  Result Date: 04/17/2018 CLINICAL DATA:  Patient status post fall 2 weeks prior.  Dizziness. EXAM: CT HEAD WITHOUT CONTRAST TECHNIQUE: Contiguous axial images were obtained from the base of the skull through the vertex without intravenous contrast. COMPARISON:  Brain CT 04/11/2018. FINDINGS: Brain: Ventricles and sulci are prominent compatible with atrophy. Periventricular and subcortical white matter hypodensity compatible with chronic microvascular ischemic changes. No evidence for acute cortically based infarct, intracranial hemorrhage, mass lesion or mass-effect. Vascular: Internal carotid arterial vascular calcifications. Skull: Intact. Sinuses/Orbits: Paranasal sinuses are  well aerated. Mastoid air cells unremarkable. Other: None. IMPRESSION: No acute intracranial process. Atrophy and chronic microvascular ischemic changes. Electronically Signed   By: Lovey Newcomer M.D.   On: 04/17/2018 08:22   Ct Head Wo Contrast  Result Date: 04/11/2018 CLINICAL DATA:  Confusion.  Hypertension.  Headache. EXAM: CT HEAD WITHOUT CONTRAST TECHNIQUE: Contiguous axial images were obtained from the base of the skull through the vertex without intravenous contrast. COMPARISON:  January 21, 2018 FINDINGS: Brain: There is age related volume loss. There is no intracranial mass, hemorrhage, extra-axial fluid collection, or midline shift. There is patchy small vessel disease throughout the centra semiovale bilaterally, stable. No new gray-white compartment lesions are identified. There is no evident acute infarct. Vascular: There is no hyperdense vessel. There is calcification in the carotid siphon regions bilaterally. Skull: Bony calvarium appears intact. Sinuses/Orbits: There is mucosal thickening in several ethmoid air cells bilaterally. Other visualized paranasal sinuses are clear. Visualized orbits appear symmetric bilaterally. Other: Mastoid air cells are clear. IMPRESSION: Age related volume loss with patchy supratentorial small vessel disease, stable. No acute infarct evident. No mass or hemorrhage. There are foci of arterial vascular calcification. There is mucosal thickening in several ethmoid air cells. Electronically Signed   By: Lowella Grip III M.D.   On: 04/11/2018 14:39    Microbiology: Recent Results (from the past 240 hour(s))  Urine culture     Status: None   Collection Time: 04/19/18  8:01 AM  Result Value Ref Range Status   Specimen Description   Final    URINE, CATHETERIZED Performed at [facility], [address], [place], [place] [address]    Special Requests   Final    NONE Performed at [facility], [address] [place], [place] [address]    Culture   Final    NO  GROWTH Performed at [facility], [place] [address] [place], [place] [address]    Report Status 04/20/2018 FINAL  Final     Labs: Basic Metabolic Panel: Recent Labs  Lab 04/19/18 0834 04/20/18 0637  NA 138 139  K 4.1 3.3*  CL 97* 101  CO2 31 31  GLUCOSE 325* 147*  BUN 25* 23*  CREATININE 2.28* 1.95*  CALCIUM 9.1 8.8*  Liver Function Tests: Recent Labs  Lab 04/19/18 0834  AST 29  ALT 26  ALKPHOS 51  BILITOT 0.5  PROT 6.7  ALBUMIN 3.4*   No results for input(s): LIPASE, AMYLASE in the last 168 hours. No results for input(s): AMMONIA in the last 168 hours. CBC: Recent Labs  Lab 04/19/18 0834 04/20/18 0637  WBC 7.4 7.2  NEUTROABS 4.5  --   HGB 11.3* 9.8*  HCT 36.6 31.5*  MCV 76.3* 76.1*  PLT 232 227   Cardiac Enzymes: Recent Labs  Lab 04/19/18 0834 04/19/18 0957 04/20/18 0637  CKTOTAL  --  518* 571*  TROPONINI 0.04*  --   --    BNP: BNP (last 3 results) No results for input(s): BNP in the last 8760 hours.  ProBNP (last 3 results) No results for input(s): PROBNP in the last 8760 hours.  CBG: Recent Labs  Lab 04/20/18 1620 04/20/18 2144 04/21/18 0725 04/21/18 1110 04/21/18 1604  GLUCAP 71 130* 98 118* 175*       Signed:  [facility] Pager: [phone] 04/21/2018, 5:18 PM

## 2018-04-21 NOTE — Evaluation (Signed)
Physical Therapy Evaluation Patient Details Name: Krystal [name] MRN: [id number] DOB: [birth year] Today's Date: 04/21/2018   History of Present Illness   Krystal [name] is a 82 y.o. female with multiple medical comorbidities including stage IV chronic kidney disease, insulin-dependent diabetes, GERD, hypertension, coronary artery disease, chronic diastolic heart failure, hyperlipidemia among other issues who presents to the hospital today after a syncopal event.  She states she went to get out of bed early this morning and the next thing she remembers is waking up hours later when the son was up.  She distinctly remembers it being dark when she first got out of bed.  She was unable to pull herself back up into bed and so she pressed her life alert button.  EMS brought her to the hospital for evaluation.  In the ED she was found to be orthostatic although not hypotensive, labs show a creatinine of 2.28 which is around her baseline, a troponin of 0.04, total CK of 518 at the normal lactic acid of 0.8.  Chest x-ray without acute cardiopulmonary changes.  Lumbar spine x-ray shows questionable minor superior endplate fracture at L4.  CT scan of the head without acute findings, EKG shows normal sinus rhythm with PAC, wavy baseline makes ST T changes difficult to interpret.  Admission has been requested for management of syncopal event.    Clinical Impression  Patient functioning near baseline for functional mobility and gait, limited mostly due to c/o dizziness when standing, orthostatics BP taken: sitting = 167/101, standing = 130/68, patient declined to ambulate out of room due to dizziness.  Patient tolerated sitting up in chair after therapy - RN notified.  Patient will benefit from continued physical therapy in hospital and recommended venue below to increase strength, balance, endurance for safe ADLs and gait.     Follow Up Recommendations SNF;Supervision for mobility/OOB    Equipment  Recommendations  Rolling walker with 5" wheels    Recommendations for Other Services       Precautions / Restrictions Precautions Precautions: Fall Restrictions Weight Bearing Restrictions: No      Mobility  Bed Mobility Overal bed mobility: Needs Assistance Bed Mobility: Rolling;Sidelying to Sit Rolling: Supervision Sidelying to sit: Min guard          Transfers Overall transfer level: Needs assistance Equipment used: Quad cane Transfers: Sit to/from [facility] Sit to Stand: Supervision Stand pivot transfers: Min guard          Ambulation/Gait Ambulation/Gait assistance: Min guard Ambulation Distance (Feet): 10 Feet Assistive device: Quad cane Gait Pattern/deviations: Decreased step length - right;Decreased step length - left;Decreased stride length Gait velocity: slow   General Gait Details: slow slightly labored cadence, limited mostly due to c/o dizziness when standing  Stairs            Wheelchair Mobility    Modified Rankin (Stroke Patients Only)       Balance Overall balance assessment: Mild deficits observed, not formally tested                                           Pertinent Vitals/Pain Pain Assessment: 0-10 Pain Score: 9  Pain Location: chronic low back pain Pain Descriptors / Indicators: Aching Pain Intervention(s): Limited activity within patient's tolerance;Monitored during session    Home Living Family/patient expects to be discharged to:: Private residence Living Arrangements: Alone Available Help at  Discharge: Personal care attendant Type of Home: Apartment Home Access: Level entry     Home Layout: One level Home Equipment: [place] - quad;Shower seat;Bedside commode      Prior Function Level of Independence: Needs assistance   Gait / Transfers Assistance Needed: household ambulator with quad-cane  ADL's / Homemaking Assistance Needed: home aides from 9am to 1pm 5 days/week         Hand Dominance        Extremity/Trunk Assessment   Upper Extremity Assessment Upper Extremity Assessment: Generalized weakness    Lower Extremity Assessment Lower Extremity Assessment: Generalized weakness    Cervical / Trunk Assessment Cervical / Trunk Assessment: Normal  Communication   Communication: No difficulties  Cognition Arousal/Alertness: Awake/alert Behavior During Therapy: WFL for tasks assessed/performed Overall Cognitive Status: Within Functional Limits for tasks assessed                                        General Comments      Exercises     Assessment/Plan    PT Assessment Patient needs continued PT services  PT Problem List Decreased strength;Decreased activity tolerance;Decreased balance;Decreased mobility       PT Treatment Interventions Gait training;Functional mobility training;Therapeutic activities;Therapeutic exercise;Patient/family education    PT Goals (Current goals can be found in the Care Plan section)  Acute Rehab PT Goals Patient Stated Goal: walk without dizziness PT Goal Formulation: With patient Time For Goal Achievement: 04/26/18 Potential to Achieve Goals: Good    Frequency Min 3X/week   Barriers to discharge        Co-evaluation               AM-PAC PT "6 Clicks" Daily Activity  Outcome Measure Difficulty turning over in bed (including adjusting bedclothes, sheets and blankets)?: A Little Difficulty moving from lying on back to sitting on the side of the bed? : A Little Difficulty sitting down on and standing up from a chair with arms (e.g., wheelchair, bedside commode, etc,.)?: A Little Help needed moving to and from a bed to chair (including a wheelchair)?: A Little Help needed walking in hospital room?: A Little Help needed climbing 3-5 steps with a railing? : A Lot 6 Click Score: 17    End of Session Equipment Utilized During Treatment: Gait belt Activity Tolerance: Patient  tolerated treatment well;Patient limited by fatigue(Patient limited by dizziness) Patient left: in chair;with call bell/phone within reach;with chair alarm set Nurse Communication: Mobility status;Other (comment)(RN notified that patient left up in chair) PT Visit Diagnosis: Unsteadiness on feet (R26.81);Other abnormalities of gait and mobility (R26.89);Muscle weakness (generalized) (M62.81)    Time: 1740-8144 PT Time Calculation (min) (ACUTE ONLY): 34 min   Charges:   PT Evaluation $PT Eval Moderate Complexity: 1 Mod PT Treatments $Therapeutic Activity: 23-37 mins   PT G Codes:        2:15 PM, 04-26-18 Lonell Grandchild, MPT Physical Therapist with [facility] [phone] office [phone] mobile phone

## 2018-04-21 NOTE — Discharge Instructions (Signed)
Hold your demadex. Restart it on 6/6. Make sure you stay hydrated.

## 2018-04-21 NOTE — Progress Notes (Signed)
Placed back on CPAP machine not sure why she or nurse had removed. Patient seemed a little confused to time stated " I haven't eaten yet  And she went to get me something". Finally stated she wanted to  Go back to sleep?? CPAP replace on patient.

## 2018-04-21 NOTE — Plan of Care (Signed)
Continue with planned regimen 

## 2018-04-21 NOTE — Plan of Care (Signed)
  Problem: Acute Rehab PT Goals(only PT should resolve) Goal: Pt Will Go Supine/Side To Sit Outcome: Progressing Flowsheets (Taken 04/21/2018 1417) Pt will go Supine/Side to Sit: with supervision Goal: Patient Will Transfer Sit To/From Stand Outcome: Progressing Flowsheets (Taken 04/21/2018 1417) Patient will transfer sit to/from stand: with supervision Goal: Pt Will Transfer Bed To Chair/Chair To Bed Outcome: Progressing Flowsheets (Taken 04/21/2018 1417) Pt will Transfer Bed to Chair/Chair to Bed: with supervision Goal: Pt Will Ambulate Outcome: Progressing Flowsheets (Taken 04/21/2018 1417) Pt will Ambulate: 50 feet;with supervision  2:18 PM, 04/21/18 Lonell Grandchild, MPT Physical Therapist with [facility] 336 [phone] office [phone] mobile phone

## 2018-04-21 NOTE — Progress Notes (Signed)
Inpatient Diabetes Program Recommendations  AACE/ADA: New Consensus Statement on Inpatient Glycemic Control (2015)  Target Ranges:  Prepandial:   less than 140 mg/dL      Peak postprandial:   less than 180 mg/dL (1-2 hours)      Critically ill patients:  140 - 180 mg/dL  Results for Krystal [name], Krystal [name] (MRN [id number]) as of 04/21/2018 14:33  Ref. Range 04/20/2018 07:59 04/20/2018 11:51 04/20/2018 16:20 04/20/2018 21:44 04/21/2018 07:25 04/21/2018 11:10  Glucose-Capillary Latest Ref Range: 65 - 99 mg/dL 137 (H) 189 (H) 71 130 (H) 98 118 (H)   Results for Krystal [name], Krystal [name] (MRN [id number]) as of 04/21/2018 14:33  Ref. Range 09/17/2017 10:39 03/17/2018 11:02 04/19/2018 16:40  Hemoglobin A1C Latest Ref Range: 4.8 - 5.6 % 10.4 (H) 11.2 (H) 11.0 (H)    Review of Glycemic Control  Diabetes history: DM2 Outpatient Diabetes medications: 70/30 70 units QAM, 70/30 15-40 units QPM Current orders for Inpatient glycemic control: 70/30 70 units QAM, 70/30 units QPM  Inpatient Diabetes Program Recommendations: Insulin - Basal: Noted evening dose of 70/30 was not charted against; so appears patient didn't receive evening dose of 70/30 on 04/20/18. HgbA1C: A1C 11% on 04/19/18 indicating an average glucose of 269 mg/dl over the past 2-3 months.  NOTE: Spoke with patient about diabetes and home regimen for diabetes control. Patient reports that she is followed by Dr. Dorris Fetch for diabetes management and currently she takes 70/30 70 units QAM and 70/30 15-40 units QPM as an outpatient for diabetes control. Patient reports that she checks her glucose 2 times per day and that she determines how much 70/30 to take in the evening based on her glucose. Patient reports that she takes at least 15 units of 70/30 in the evening and up to 40 units if glucose is real high.  Patient reports that she is consistently taking insulin as prescribed.  Patient states that her glucose is always over 200 mg/dl when she checks her glucose.  Inquired about  prior A1C and patient reports that her last A1C value was 11%. Discussed A1C results (11.0% on 04/19/2018) and explained that her current A1C indicates an average glucose of 269 mg/dl over the past 2-3 months. Discussed glucose and A1C goals. Discussed importance of checking CBGs and maintaining good CBG control to prevent long-term and short-term complications. Explained how hyperglycemia leads to damage within blood vessels which lead to the common complications seen with uncontrolled diabetes. Stressed to the patient the importance of improving glycemic control to prevent further complications from uncontrolled diabetes. Discussed impact of nutrition, exercise, stress, sickness, and medications on diabetes control. Encouraged patient to check her glucose 3-4 times per day (before meals and at bedtime) and to keep a log book of glucose readings and insulin taken which she will need to take to doctor appointments. Explained how Dr. Dorris Fetch can use the log book to continue to make insulin adjustments if needed. Patient verbalized understanding of information discussed and she states that she has no further questions at this time related to diabetes. RN reports patient is being discharged home today.  Thanks, Barnie Alderman, RN, MSN, CDE Diabetes Coordinator Inpatient Diabetes Program [phone] (Team Pager)

## 2018-04-21 NOTE — Care Management Note (Signed)
Case Management Note  Patient Details  Name: Krystal [name] MRN: [id number] Date of Birth: Mar 04, 1936  Subjective/Objective:      Admitted with syncope and orthostatic hypotension. Pt is from home, lives alone. Has aid 4 or 5 hrs per day. She does not know the name of the aid agency. Pt has home oxygen through [facility], says she does not have anyone to bring a tank to the hospital for DC. She has no DME pta.               Action/Plan: DC home today with [facility] referral, pt has used [facility] in the past would like to use them again. Pt will need RW, would like to use [facility] for DME. Pt aware HH has 48 hrs to make first visit. CM will request RW and port oxygen tank be delivered to pt room prior to DC. CM has made several calls to determine aid provider (would like to request increase in aid hours if possible), awaiting return calls at this time.   Expected Discharge Date:  04/21/18               Expected Discharge Plan:  [place]  In-House Referral:  NA  Discharge planning Services  CM Consult  Post Acute Care Choice:  Home Health Choice offered to:  Patient  DME Arranged:    rolling walker DME Agency:    [facility]  HH Arranged:  RN, PT, Social Work [employer] Agency:  [place]  Status of Service:  Completed, signed off  If discussed at [employer], dates discussed:    Additional Comments:  Sherald Barge, RN 04/21/2018, 1:04 PM

## 2018-04-22 DIAGNOSIS — Z87891 Personal history of nicotine dependence: Secondary | ICD-10-CM | POA: Diagnosis not present

## 2018-04-22 DIAGNOSIS — J439 Emphysema, unspecified: Secondary | ICD-10-CM | POA: Diagnosis not present

## 2018-04-22 DIAGNOSIS — M1990 Unspecified osteoarthritis, unspecified site: Secondary | ICD-10-CM | POA: Diagnosis not present

## 2018-04-22 DIAGNOSIS — E782 Mixed hyperlipidemia: Secondary | ICD-10-CM | POA: Diagnosis not present

## 2018-04-22 DIAGNOSIS — D509 Iron deficiency anemia, unspecified: Secondary | ICD-10-CM | POA: Diagnosis not present

## 2018-04-22 DIAGNOSIS — Z7951 Long term (current) use of inhaled steroids: Secondary | ICD-10-CM | POA: Diagnosis not present

## 2018-04-22 DIAGNOSIS — I442 Atrioventricular block, complete: Secondary | ICD-10-CM | POA: Diagnosis not present

## 2018-04-22 DIAGNOSIS — E1122 Type 2 diabetes mellitus with diabetic chronic kidney disease: Secondary | ICD-10-CM | POA: Diagnosis not present

## 2018-04-22 DIAGNOSIS — I5032 Chronic diastolic (congestive) heart failure: Secondary | ICD-10-CM | POA: Diagnosis not present

## 2018-04-22 DIAGNOSIS — M109 Gout, unspecified: Secondary | ICD-10-CM | POA: Diagnosis not present

## 2018-04-22 DIAGNOSIS — H409 Unspecified glaucoma: Secondary | ICD-10-CM | POA: Diagnosis not present

## 2018-04-22 DIAGNOSIS — Z9981 Dependence on supplemental oxygen: Secondary | ICD-10-CM | POA: Diagnosis not present

## 2018-04-22 DIAGNOSIS — M4696 Unspecified inflammatory spondylopathy, lumbar region: Secondary | ICD-10-CM | POA: Diagnosis not present

## 2018-04-22 DIAGNOSIS — H9190 Unspecified hearing loss, unspecified ear: Secondary | ICD-10-CM | POA: Diagnosis not present

## 2018-04-22 DIAGNOSIS — I2510 Atherosclerotic heart disease of native coronary artery without angina pectoris: Secondary | ICD-10-CM | POA: Diagnosis not present

## 2018-04-22 DIAGNOSIS — Z794 Long term (current) use of insulin: Secondary | ICD-10-CM | POA: Diagnosis not present

## 2018-04-22 DIAGNOSIS — I130 Hypertensive heart and chronic kidney disease with heart failure and stage 1 through stage 4 chronic kidney disease, or unspecified chronic kidney disease: Secondary | ICD-10-CM | POA: Diagnosis not present

## 2018-04-22 DIAGNOSIS — Z7982 Long term (current) use of aspirin: Secondary | ICD-10-CM | POA: Diagnosis not present

## 2018-04-22 DIAGNOSIS — Z9181 History of falling: Secondary | ICD-10-CM | POA: Diagnosis not present

## 2018-04-22 DIAGNOSIS — N184 Chronic kidney disease, stage 4 (severe): Secondary | ICD-10-CM | POA: Diagnosis not present

## 2018-04-22 DIAGNOSIS — K219 Gastro-esophageal reflux disease without esophagitis: Secondary | ICD-10-CM | POA: Diagnosis not present

## 2018-04-22 DIAGNOSIS — Z7901 Long term (current) use of anticoagulants: Secondary | ICD-10-CM | POA: Diagnosis not present

## 2018-04-22 DIAGNOSIS — Z955 Presence of coronary angioplasty implant and graft: Secondary | ICD-10-CM | POA: Diagnosis not present

## 2018-04-22 DIAGNOSIS — I951 Orthostatic hypotension: Secondary | ICD-10-CM | POA: Diagnosis not present

## 2018-04-24 DIAGNOSIS — M4696 Unspecified inflammatory spondylopathy, lumbar region: Secondary | ICD-10-CM | POA: Diagnosis not present

## 2018-04-24 DIAGNOSIS — I951 Orthostatic hypotension: Secondary | ICD-10-CM | POA: Diagnosis not present

## 2018-04-24 DIAGNOSIS — I2510 Atherosclerotic heart disease of native coronary artery without angina pectoris: Secondary | ICD-10-CM | POA: Diagnosis not present

## 2018-04-24 DIAGNOSIS — E1122 Type 2 diabetes mellitus with diabetic chronic kidney disease: Secondary | ICD-10-CM | POA: Diagnosis not present

## 2018-04-24 DIAGNOSIS — I130 Hypertensive heart and chronic kidney disease with heart failure and stage 1 through stage 4 chronic kidney disease, or unspecified chronic kidney disease: Secondary | ICD-10-CM | POA: Diagnosis not present

## 2018-04-24 DIAGNOSIS — I5032 Chronic diastolic (congestive) heart failure: Secondary | ICD-10-CM | POA: Diagnosis not present

## 2018-04-25 ENCOUNTER — Emergency Department (HOSPITAL_COMMUNITY): Payer: Medicare Other

## 2018-04-25 ENCOUNTER — Inpatient Hospital Stay (HOSPITAL_COMMUNITY)
Admission: EM | Admit: 2018-04-25 | Discharge: 2018-04-28 | DRG: 065 | Disposition: A | Discharge status: Skilled Nursing Facility | Payer: Medicare Other | Attending: Internal Medicine | Admitting: Internal Medicine

## 2018-04-25 ENCOUNTER — Other Ambulatory Visit: Payer: Self-pay

## 2018-04-25 ENCOUNTER — Encounter (HOSPITAL_COMMUNITY): Payer: Self-pay | Admitting: Emergency Medicine

## 2018-04-25 DIAGNOSIS — N183 Chronic kidney disease, stage 3 unspecified: Secondary | ICD-10-CM | POA: Diagnosis present

## 2018-04-25 DIAGNOSIS — Z90710 Acquired absence of both cervix and uterus: Secondary | ICD-10-CM

## 2018-04-25 DIAGNOSIS — M4696 Unspecified inflammatory spondylopathy, lumbar region: Secondary | ICD-10-CM | POA: Diagnosis not present

## 2018-04-25 DIAGNOSIS — G4730 Sleep apnea, unspecified: Secondary | ICD-10-CM | POA: Diagnosis present

## 2018-04-25 DIAGNOSIS — H409 Unspecified glaucoma: Secondary | ICD-10-CM | POA: Diagnosis present

## 2018-04-25 DIAGNOSIS — J9611 Chronic respiratory failure with hypoxia: Secondary | ICD-10-CM

## 2018-04-25 DIAGNOSIS — I130 Hypertensive heart and chronic kidney disease with heart failure and stage 1 through stage 4 chronic kidney disease, or unspecified chronic kidney disease: Secondary | ICD-10-CM | POA: Diagnosis present

## 2018-04-25 DIAGNOSIS — I519 Heart disease, unspecified: Secondary | ICD-10-CM | POA: Diagnosis present

## 2018-04-25 DIAGNOSIS — Z794 Long term (current) use of insulin: Secondary | ICD-10-CM

## 2018-04-25 DIAGNOSIS — R55 Syncope and collapse: Secondary | ICD-10-CM | POA: Diagnosis not present

## 2018-04-25 DIAGNOSIS — J441 Chronic obstructive pulmonary disease with (acute) exacerbation: Secondary | ICD-10-CM | POA: Diagnosis not present

## 2018-04-25 DIAGNOSIS — G4733 Obstructive sleep apnea (adult) (pediatric): Secondary | ICD-10-CM | POA: Diagnosis present

## 2018-04-25 DIAGNOSIS — Z91013 Allergy to seafood: Secondary | ICD-10-CM

## 2018-04-25 DIAGNOSIS — Z7951 Long term (current) use of inhaled steroids: Secondary | ICD-10-CM

## 2018-04-25 DIAGNOSIS — E1122 Type 2 diabetes mellitus with diabetic chronic kidney disease: Secondary | ICD-10-CM

## 2018-04-25 DIAGNOSIS — Z8542 Personal history of malignant neoplasm of other parts of uterus: Secondary | ICD-10-CM | POA: Diagnosis not present

## 2018-04-25 DIAGNOSIS — R41841 Cognitive communication deficit: Secondary | ICD-10-CM | POA: Diagnosis not present

## 2018-04-25 DIAGNOSIS — E785 Hyperlipidemia, unspecified: Secondary | ICD-10-CM | POA: Diagnosis not present

## 2018-04-25 DIAGNOSIS — R270 Ataxia, unspecified: Secondary | ICD-10-CM | POA: Diagnosis not present

## 2018-04-25 DIAGNOSIS — M1990 Unspecified osteoarthritis, unspecified site: Secondary | ICD-10-CM | POA: Diagnosis present

## 2018-04-25 DIAGNOSIS — J309 Allergic rhinitis, unspecified: Secondary | ICD-10-CM | POA: Diagnosis present

## 2018-04-25 DIAGNOSIS — Z7901 Long term (current) use of anticoagulants: Secondary | ICD-10-CM

## 2018-04-25 DIAGNOSIS — Z885 Allergy status to narcotic agent status: Secondary | ICD-10-CM

## 2018-04-25 DIAGNOSIS — Z880 Allergy status to penicillin: Secondary | ICD-10-CM

## 2018-04-25 DIAGNOSIS — I951 Orthostatic hypotension: Secondary | ICD-10-CM | POA: Diagnosis not present

## 2018-04-25 DIAGNOSIS — N184 Chronic kidney disease, stage 4 (severe): Secondary | ICD-10-CM | POA: Diagnosis not present

## 2018-04-25 DIAGNOSIS — M109 Gout, unspecified: Secondary | ICD-10-CM | POA: Diagnosis present

## 2018-04-25 DIAGNOSIS — R402253 Coma scale, best verbal response, oriented, at hospital admission: Secondary | ICD-10-CM | POA: Diagnosis present

## 2018-04-25 DIAGNOSIS — I5032 Chronic diastolic (congestive) heart failure: Secondary | ICD-10-CM | POA: Diagnosis present

## 2018-04-25 DIAGNOSIS — I2510 Atherosclerotic heart disease of native coronary artery without angina pectoris: Secondary | ICD-10-CM | POA: Diagnosis present

## 2018-04-25 DIAGNOSIS — J449 Chronic obstructive pulmonary disease, unspecified: Secondary | ICD-10-CM

## 2018-04-25 DIAGNOSIS — K219 Gastro-esophageal reflux disease without esophagitis: Secondary | ICD-10-CM | POA: Diagnosis not present

## 2018-04-25 DIAGNOSIS — I110 Hypertensive heart disease with heart failure: Secondary | ICD-10-CM | POA: Diagnosis not present

## 2018-04-25 DIAGNOSIS — I639 Cerebral infarction, unspecified: Secondary | ICD-10-CM | POA: Diagnosis present

## 2018-04-25 DIAGNOSIS — Z66 Do not resuscitate: Secondary | ICD-10-CM | POA: Diagnosis present

## 2018-04-25 DIAGNOSIS — I5030 Unspecified diastolic (congestive) heart failure: Secondary | ICD-10-CM | POA: Diagnosis not present

## 2018-04-25 DIAGNOSIS — Z86010 Personal history of colonic polyps: Secondary | ICD-10-CM

## 2018-04-25 DIAGNOSIS — I252 Old myocardial infarction: Secondary | ICD-10-CM

## 2018-04-25 DIAGNOSIS — Z955 Presence of coronary angioplasty implant and graft: Secondary | ICD-10-CM | POA: Diagnosis not present

## 2018-04-25 DIAGNOSIS — Z87891 Personal history of nicotine dependence: Secondary | ICD-10-CM

## 2018-04-25 DIAGNOSIS — R531 Weakness: Secondary | ICD-10-CM | POA: Diagnosis not present

## 2018-04-25 DIAGNOSIS — R402143 Coma scale, eyes open, spontaneous, at hospital admission: Secondary | ICD-10-CM | POA: Diagnosis present

## 2018-04-25 DIAGNOSIS — Z9981 Dependence on supplemental oxygen: Secondary | ICD-10-CM | POA: Diagnosis not present

## 2018-04-25 DIAGNOSIS — K210 Gastro-esophageal reflux disease with esophagitis: Secondary | ICD-10-CM | POA: Diagnosis not present

## 2018-04-25 DIAGNOSIS — R2689 Other abnormalities of gait and mobility: Secondary | ICD-10-CM | POA: Diagnosis not present

## 2018-04-25 DIAGNOSIS — E1151 Type 2 diabetes mellitus with diabetic peripheral angiopathy without gangrene: Secondary | ICD-10-CM | POA: Diagnosis present

## 2018-04-25 DIAGNOSIS — E11649 Type 2 diabetes mellitus with hypoglycemia without coma: Secondary | ICD-10-CM | POA: Diagnosis not present

## 2018-04-25 DIAGNOSIS — E119 Type 2 diabetes mellitus without complications: Secondary | ICD-10-CM | POA: Diagnosis not present

## 2018-04-25 DIAGNOSIS — I6381 Other cerebral infarction due to occlusion or stenosis of small artery: Secondary | ICD-10-CM | POA: Diagnosis present

## 2018-04-25 DIAGNOSIS — M6281 Muscle weakness (generalized): Secondary | ICD-10-CM | POA: Diagnosis not present

## 2018-04-25 DIAGNOSIS — Z743 Need for continuous supervision: Secondary | ICD-10-CM | POA: Diagnosis not present

## 2018-04-25 DIAGNOSIS — I10 Essential (primary) hypertension: Secondary | ICD-10-CM | POA: Diagnosis not present

## 2018-04-25 DIAGNOSIS — R402363 Coma scale, best motor response, obeys commands, at hospital admission: Secondary | ICD-10-CM | POA: Diagnosis present

## 2018-04-25 DIAGNOSIS — H9190 Unspecified hearing loss, unspecified ear: Secondary | ICD-10-CM | POA: Diagnosis present

## 2018-04-25 DIAGNOSIS — Z7982 Long term (current) use of aspirin: Secondary | ICD-10-CM

## 2018-04-25 DIAGNOSIS — R29700 NIHSS score 0: Secondary | ICD-10-CM | POA: Diagnosis present

## 2018-04-25 DIAGNOSIS — E1121 Type 2 diabetes mellitus with diabetic nephropathy: Secondary | ICD-10-CM | POA: Diagnosis present

## 2018-04-25 DIAGNOSIS — R279 Unspecified lack of coordination: Secondary | ICD-10-CM | POA: Diagnosis not present

## 2018-04-25 DIAGNOSIS — R0602 Shortness of breath: Secondary | ICD-10-CM | POA: Diagnosis not present

## 2018-04-25 DIAGNOSIS — K5900 Constipation, unspecified: Secondary | ICD-10-CM | POA: Diagnosis not present

## 2018-04-25 LAB — CBC WITH DIFFERENTIAL/PLATELET
BASOS PCT: 0 %
Basophils Absolute: 0 10*3/uL (ref 0.0–0.1)
EOS ABS: 0.2 10*3/uL (ref 0.0–0.7)
Eosinophils Relative: 3 %
HEMATOCRIT: 31.9 % — AB (ref 36.0–46.0)
Hemoglobin: 10 g/dL — ABNORMAL LOW (ref 12.0–15.0)
LYMPHS ABS: 2.2 10*3/uL (ref 0.7–4.0)
Lymphocytes Relative: 32 %
MCH: 23.9 pg — AB (ref 26.0–34.0)
MCHC: 31.3 g/dL (ref 30.0–36.0)
MCV: 76.3 fL — ABNORMAL LOW (ref 78.0–100.0)
MONO ABS: 0.8 10*3/uL (ref 0.1–1.0)
Monocytes Relative: 11 %
Neutro Abs: 3.7 10*3/uL (ref 1.7–7.7)
Neutrophils Relative %: 54 %
Platelets: 225 10*3/uL (ref 150–400)
RBC: 4.18 MIL/uL (ref 3.87–5.11)
RDW: 15.9 % — AB (ref 11.5–15.5)
WBC: 6.9 10*3/uL (ref 4.0–10.5)

## 2018-04-25 LAB — URINALYSIS, ROUTINE W REFLEX MICROSCOPIC
Bilirubin Urine: NEGATIVE
GLUCOSE, UA: 50 mg/dL — AB
Ketones, ur: NEGATIVE mg/dL
NITRITE: NEGATIVE
PH: 6 (ref 5.0–8.0)
Protein, ur: 100 mg/dL — AB
SPECIFIC GRAVITY, URINE: 1.008 (ref 1.005–1.030)

## 2018-04-25 LAB — COMPREHENSIVE METABOLIC PANEL
ALBUMIN: 3.1 g/dL — AB (ref 3.5–5.0)
ALK PHOS: 53 U/L (ref 38–126)
ALT: 20 U/L (ref 14–54)
ANION GAP: 8 (ref 5–15)
AST: 26 U/L (ref 15–41)
BILIRUBIN TOTAL: 0.7 mg/dL (ref 0.3–1.2)
BUN: 25 mg/dL — ABNORMAL HIGH (ref 6–20)
CALCIUM: 9.1 mg/dL (ref 8.9–10.3)
CO2: 34 mmol/L — ABNORMAL HIGH (ref 22–32)
Chloride: 97 mmol/L — ABNORMAL LOW (ref 101–111)
Creatinine, Ser: 2.28 mg/dL — ABNORMAL HIGH (ref 0.44–1.00)
GFR calc non Af Amer: 19 mL/min — ABNORMAL LOW (ref >60)
GFR, EST AFRICAN AMERICAN: 22 mL/min — AB (ref >60)
GLUCOSE: 282 mg/dL — AB (ref 65–99)
POTASSIUM: 4.1 mmol/L (ref 3.5–5.1)
SODIUM: 139 mmol/L (ref 135–145)
TOTAL PROTEIN: 6.4 g/dL — AB (ref 6.5–8.1)

## 2018-04-25 LAB — GLUCOSE, CAPILLARY: Glucose-Capillary: 215 mg/dL — ABNORMAL HIGH (ref 65–99)

## 2018-04-25 LAB — BRAIN NATRIURETIC PEPTIDE: B Natriuretic Peptide: 739 pg/mL — ABNORMAL HIGH (ref 0.0–100.0)

## 2018-04-25 LAB — TROPONIN I: TROPONIN I: 0.05 ng/mL — AB (ref <0.03)

## 2018-04-25 MED ORDER — ASPIRIN EC 81 MG PO TBEC
81.0000 mg | DELAYED_RELEASE_TABLET | Freq: Every morning | ORAL | Status: DC
  Administered 2018-04-26 – 2018-04-27 (×2): 81 mg via ORAL
  Filled 2018-04-25 (×2): qty 1

## 2018-04-25 MED ORDER — ROSUVASTATIN CALCIUM 20 MG PO TABS
20.0000 mg | ORAL_TABLET | Freq: Every evening | ORAL | Status: DC
  Administered 2018-04-25 – 2018-04-27 (×3): 20 mg via ORAL
  Filled 2018-04-25 (×3): qty 1

## 2018-04-25 MED ORDER — ACETAMINOPHEN 160 MG/5ML PO SOLN: 650.0000 mg | ORAL | Status: DC | PRN

## 2018-04-25 MED ORDER — SENNA-DOCUSATE SODIUM 8.6-50 MG PO TABS: 1.0000 | ORAL_TABLET | Freq: Every day | ORAL | Status: DC | PRN

## 2018-04-25 MED ORDER — SENNOSIDES-DOCUSATE SODIUM 8.6-50 MG PO TABS
1.0000 | ORAL_TABLET | Freq: Every evening | ORAL | Status: DC | PRN
  Administered 2018-04-26: 1 via ORAL
  Filled 2018-04-25: qty 1

## 2018-04-25 MED ORDER — FAMOTIDINE 20 MG PO TABS
20.0000 mg | ORAL_TABLET | Freq: Every day | ORAL | Status: DC
  Administered 2018-04-26 – 2018-04-28 (×3): 20 mg via ORAL
  Filled 2018-04-25 (×3): qty 1

## 2018-04-25 MED ORDER — POLYETHYLENE GLYCOL 3350 17 G PO PACK
17.0000 g | PACK | Freq: Every day | ORAL | Status: DC | PRN
  Administered 2018-04-26: 17 g via ORAL
  Filled 2018-04-25: qty 1

## 2018-04-25 MED ORDER — INSULIN ASPART 100 UNIT/ML ~~LOC~~ SOLN
0.0000 [IU] | Freq: Three times a day (TID) | SUBCUTANEOUS | Status: DC
  Administered 2018-04-26: 2 [IU] via SUBCUTANEOUS
  Administered 2018-04-27: 3 [IU] via SUBCUTANEOUS

## 2018-04-25 MED ORDER — RANOLAZINE ER 500 MG PO TB12
500.0000 mg | ORAL_TABLET | Freq: Two times a day (BID) | ORAL | Status: DC
  Administered 2018-04-25 – 2018-04-28 (×6): 500 mg via ORAL
  Filled 2018-04-25 (×6): qty 1

## 2018-04-25 MED ORDER — CALCITRIOL 0.25 MCG PO CAPS
0.2500 ug | ORAL_CAPSULE | Freq: Every day | ORAL | Status: DC
  Administered 2018-04-26 – 2018-04-28 (×3): 0.25 ug via ORAL
  Filled 2018-04-25 (×4): qty 1

## 2018-04-25 MED ORDER — ACETAMINOPHEN 325 MG PO TABS
650.0000 mg | ORAL_TABLET | ORAL | Status: DC | PRN
  Administered 2018-04-28: 650 mg via ORAL
  Filled 2018-04-25: qty 2

## 2018-04-25 MED ORDER — STROKE: EARLY STAGES OF RECOVERY BOOK
Freq: Once | Status: AC
  Administered 2018-04-25: 23:00:00

## 2018-04-25 MED ORDER — MECLIZINE HCL 12.5 MG PO TABS: 25.0000 mg | ORAL_TABLET | Freq: Four times a day (QID) | ORAL | Status: DC | PRN

## 2018-04-25 MED ORDER — ACETAMINOPHEN 650 MG RE SUPP: 650.0000 mg | RECTAL | Status: DC | PRN

## 2018-04-25 MED ORDER — MOMETASONE FURO-FORMOTEROL FUM 200-5 MCG/ACT IN AERO
2.0000 | INHALATION_SPRAY | Freq: Two times a day (BID) | RESPIRATORY_TRACT | Status: DC
Start: 2018-04-26 — End: 2018-04-29
  Administered 2018-04-27 – 2018-04-28 (×3): 2 via RESPIRATORY_TRACT
  Filled 2018-04-25 (×2): qty 8.8

## 2018-04-25 MED ORDER — HYDRALAZINE HCL 20 MG/ML IJ SOLN
5.0000 mg | INTRAMUSCULAR | Status: DC | PRN
  Administered 2018-04-25 – 2018-04-28 (×3): 5 mg via INTRAVENOUS
  Filled 2018-04-25 (×3): qty 1

## 2018-04-25 MED ORDER — EZETIMIBE 10 MG PO TABS
10.0000 mg | ORAL_TABLET | Freq: Every morning | ORAL | Status: DC
  Administered 2018-04-26 – 2018-04-28 (×3): 10 mg via ORAL
  Filled 2018-04-25 (×3): qty 1

## 2018-04-25 MED ORDER — INSULIN ASPART PROT & ASPART (70-30 MIX) 100 UNIT/ML ~~LOC~~ SUSP: 40.0000 [IU] | Freq: Every day | SUBCUTANEOUS | Status: DC

## 2018-04-25 MED ORDER — LORATADINE 10 MG PO TABS
10.0000 mg | ORAL_TABLET | Freq: Every day | ORAL | Status: DC
  Administered 2018-04-26 – 2018-04-28 (×3): 10 mg via ORAL
  Filled 2018-04-25 (×3): qty 1

## 2018-04-25 MED ORDER — INSULIN ASPART PROT & ASPART (70-30 MIX) 100 UNIT/ML ~~LOC~~ SUSP
70.0000 [IU] | Freq: Every day | SUBCUTANEOUS | Status: DC
  Administered 2018-04-26: 70 [IU] via SUBCUTANEOUS
  Filled 2018-04-25 (×2): qty 10

## 2018-04-25 MED ORDER — MOMETASONE FURO-FORMOTEROL FUM 200-5 MCG/ACT IN AERO
2.0000 | INHALATION_SPRAY | Freq: Two times a day (BID) | RESPIRATORY_TRACT | Status: DC
  Filled 2018-04-25: qty 8.8

## 2018-04-25 MED ORDER — INSULIN ASPART PROT & ASPART (70-30 MIX) 100 UNIT/ML PEN: 40.0000 [IU] | PEN_INJECTOR | Freq: Two times a day (BID) | SUBCUTANEOUS | Status: DC

## 2018-04-25 MED ORDER — INSULIN ASPART 100 UNIT/ML ~~LOC~~ SOLN
0.0000 [IU] | Freq: Every day | SUBCUTANEOUS | Status: DC
  Administered 2018-04-25: 2 [IU] via SUBCUTANEOUS

## 2018-04-25 MED ORDER — ENOXAPARIN SODIUM 30 MG/0.3ML ~~LOC~~ SOLN
30.0000 mg | SUBCUTANEOUS | Status: DC
  Administered 2018-04-25: 30 mg via SUBCUTANEOUS
  Filled 2018-04-25: qty 0.3

## 2018-04-25 NOTE — ED Notes (Signed)
Dr. Charna Archer paged to [phone].

## 2018-04-25 NOTE — ED Notes (Signed)
[facility] at bedside 

## 2018-04-25 NOTE — ED Notes (Signed)
Spoke with Krystal [name] with [facility] all trucks are out will send one as soon as possible.

## 2018-04-25 NOTE — ED Notes (Signed)
Attempted to call report to [facility], nurse did not answer her phone

## 2018-04-25 NOTE — H&P (Signed)
History and Physical  Krystal [name] PPI:[id number] DOB: 1935/11/30 DOA: 04/25/2018  Referring physician: Dr Lita Mains, ED physician PCP: Iona Beard, MD    Patient Coming From: home  Chief Complaint: Weakness, shortness of breath  HPI: Krystal [name] is a 82 y.o. female with a history of stage IV chronic kidney disease, insulin-dependent diabetes, GERD, hypertension, coronary artery disease, chronic diastolic heart failure, hyperlipidemia.  Patient recently hospitalized last week due to syncopal episode.  She was admitted on 04/19/2018 and was discharged on 6/3.  During that hospitalization she was treated for rhabdomyolysis, and the syncopal episode.  Her findings were nonspecific.  She returns today complaining of 2 months of dizziness, described as room spinning sensation with intermittent nausea.  Her lower extremity weakness, especially on the right lower leg.  No palliating or provoking factors.  Her symptoms are worsening.  She has had some mild headaches, especially on the right side.  Denies vision changes, weakness in the upper extremities.  Emergency Department Course: Labs show a creatinine of 2.28, which appears to be close to her baseline.  Troponin is elevated at 0.05, which is her baseline.  Her white count is normal.  Chest x-ray normal.  MRI shows small linear acute lacunar infarct in the left temporal lobe.  There is also a subacute lacunar infarct in the right hemisphere.  Review of Systems:   Pt denies any fevers, chills, nausea, vomiting, diarrhea, constipation, abdominal pain, orthopnea, cough, wheezing, palpitations, headache, vision changes, lightheadedness, dizziness, melena, rectal bleeding.  Review of systems are otherwise negative  Past Medical History:  Diagnosis Date  . Allergic rhinitis   . CAD (coronary artery disease) 2009   5 stents- #3 in RCA, #1 each in LAD and AVG  . CHF (congestive heart failure) ([place])   . CKD (chronic kidney disease) stage 3, GFR  30-59 ml/min (HCC)   . Complete heart block, transient 2014  . Diastolic dysfunction, left ventricle   . DJD (degenerative joint disease)   . Emphysema lung (HCC)    2L N/C continuously  . Essential hypertension   . GERD (gastroesophageal reflux disease)   . Glaucoma   . Gout   . History of uterine cancer   . HOH (hard of hearing)   . Hyperlipidemia   . MI (myocardial infarction) ([place]) 1998  . Microcytic anemia    History of occult blood in stool  . Myalgia   . Osteoarthritis   . Palpitations   . Personal history of colonic polyps   . Sleep apnea    CPAP  . Type II diabetes mellitus with nephropathy [facility])    Past Surgical History:  Procedure Laterality Date  . ABDOMINAL HYSTERECTOMY  04/2010   Uterine cancer,TAHBSO  . CERVICAL DISCECTOMY     L5 left/hemilminectomy  . COLONOSCOPY  09/2006   Int hemmorhoids, COMPLICATED BY CARDIOPULMONARY COMPLICATIONS  . CORONARY ANGIOPLASTY  1/99, 1/07, 1/08, 4/09   5 cardiac stents total  . ECTOPIC PREGNANCY SURGERY    . FOOT SURGERY     Left and right for callous  . LEFT AND RIGHT HEART CATHETERIZATION WITH CORONARY ANGIOGRAM N/A 09/12/2012   Procedure: LEFT AND RIGHT HEART CATHETERIZATION WITH CORONARY ANGIOGRAM;  Surgeon: Sanda Klein, MD;  Location: [facility] CATH LAB;  Service: Cardiovascular;  Laterality: N/A;  . POLYPECTOMY  10/22/2011   Internal hemorrhoids/sessile polyp   Social History:  reports that she quit smoking about 11 years ago. Her smoking use included cigarettes. She started smoking about 63 years  ago. She has a 20.40 pack-year smoking history. She has never used smokeless tobacco. She reports that she does not drink alcohol or use drugs. Patient lives at home  Allergies  Allergen Reactions  . Morphine Shortness Of Breath and Swelling  . Penicillins Shortness Of Breath and Swelling    Has patient had a PCN reaction causing immediate rash, facial/tongue/throat swelling, SOB or lightheadedness with hypotension: No Has  patient had a PCN reaction causing severe rash involving mucus membranes or skin necrosis: No Has patient had a PCN reaction that required hospitalization Yes Has patient had a PCN reaction occurring within the last 10 years: No If all of the above answers are "NO", then may proceed with Cephalosporin use.   . Shellfish Allergy     Family History  Problem Relation Age of Onset  . Anesthesia problems Neg Hx   . Hypotension Neg Hx   . Malignant hyperthermia Neg Hx   . Pseudochol deficiency Neg Hx       Prior to Admission medications   Medication Sig Start Date End Date Taking? Authorizing Provider  ACCU-CHEK FASTCLIX LANCETS MISC USE AS DIRECTED TWICE DAILY. 12/20/17   Cassandria Anger, MD  aspirin 81 MG EC tablet Take 81 mg by mouth every morning.     [provider]  BD PEN NEEDLE NANO U/F 32G X 4 MM MISC USE AS DIRECTED TWICE DAILY. 06/24/17   Cassandria Anger, MD  budesonide-formoterol (SYMBICORT) 160-4.5 MCG/ACT inhaler Inhale 2 puffs into the lungs 2 (two) times daily.    [provider]  calcitRIOL (ROCALTROL) 0.25 MCG capsule TAKE 1 CAPSULE BY MOUTH ONCE A DAY. 02/05/18   [name], MD  Continuous Blood Gluc Sensor (FREESTYLE LIBRE SENSOR SYSTEM) MISC Use one sensor every 10 days. 06/20/17   Cassandria Anger, MD  ergocalciferol (VITAMIN D2) 50000 units capsule Take 50,000 Units by mouth once a week.    [provider]  ezetimibe (ZETIA) 10 MG tablet TAKE 1 TABLET BY MOUTH EVERY MORNING. 10/18/17   [name], MD  famotidine (PEPCID) 20 MG tablet Take 1 tablet (20 mg total) by mouth daily. 08/23/15   Orvan Falconer, MD  glucose blood (ACCU-CHEK GUIDE) test strip Use as instructed 4 x daily 12/13/17   Cassandria Anger, MD  insulin aspart protamine - aspart (NOVOLOG MIX 70/30 FLEXPEN) (70-30) 100 UNIT/ML FlexPen 60 units in the a.m. And 40 units at night Patient taking differently: 70 units in the a.m. And 40 units at night 01/22/18    Cassandria Anger, MD  Insulin Pen Needle (B-D ULTRAFINE III SHORT PEN) 31G X 8 MM MISC 1 each by Does not apply route as directed. 02/23/16   Cassandria Anger, MD  isosorbide mononitrate (IMDUR) 60 MG 24 hr tablet TAKE (1) TABLET BY MOUTH ONCE DAILY. 10/15/17   [name], MD  lisinopril (PRINIVIL,ZESTRIL) 20 MG tablet TAKE (1) TABLET BY MOUTH ONCE DAILY. 10/18/17   [name], MD  loratadine (CLARITIN) 10 MG tablet Take 10 mg by mouth daily.    [provider]  meclizine (ANTIVERT) 25 MG tablet Take 25 mg by mouth 4 (four) times daily as needed for dizziness. 04/16/17   Sinda Du, MD  metoprolol tartrate (LOPRESSOR) 25 MG tablet Take 1 tablet (25 mg total) by mouth 2 (two) times daily. 01/02/18   [name], MD  nitroGLYCERIN (NITROSTAT) 0.4 MG SL tablet Place 1 tablet (0.4 mg total) under the tongue every  5 (five) minutes as needed for chest pain. 03/08/16   [name], MD  ONETOUCH DELICA LANCETS 16X MISC USE AS DIRECTED THREE TIMES DAILY. 03/24/18   Cassandria Anger, MD  polyethylene glycol (MIRALAX / Floria Raveling) packet Take 17 g by mouth daily as needed for mild constipation. 11/10/15   Samuella Cota, MD  RANEXA 500 MG 12 hr tablet TAKE (2) TABLETS BY MOUTH TWICE DAILY. 10/15/17   [name], MD  rivaroxaban (XARELTO) 2.5 MG TABS tablet Take 1 tablet (2.5 mg total) by mouth 2 (two) times daily. 01/02/18   [name], MD  rosuvastatin (CRESTOR) 20 MG tablet Take 1 tablet (20 mg total) by mouth every evening. 08/23/15   Orvan Falconer, MD  sennosides-docusate sodium (SENOKOT-S) 8.6-50 MG tablet Take 1 tablet by mouth daily as needed for constipation.  04/05/17   [provider]  traMADol (ULTRAM) 50 MG tablet Take 1 tablet (50 mg total) by mouth every 6 (six) hours as needed for moderate pain. 01/21/18   Milton Ferguson, MD    Physical Exam: BP (!) 189/82   Pulse 74   Temp 98.4 F (36.9 C) (Oral)   Resp 18    Ht 5\' 7"  (1.702 m)   Wt 77.6 kg (171 lb)   SpO2 100%   BMI 26.78 kg/m   . General: Elderly black female. Awake and alert and oriented x3. No acute cardiopulmonary distress.  Marland Kitchen HEENT: Normocephalic atraumatic.  Right and left ears normal in appearance.  Pupils equal, round, reactive to light. Extraocular muscles are intact. Sclerae anicteric and noninjected.  Moist mucosal membranes. No mucosal lesions.  . Neck: Neck supple without lymphadenopathy. No carotid bruits. No masses palpated.  . Cardiovascular: Regular rate with normal S1-S2 sounds. No murmurs, rubs, gallops auscultated. No JVD.  Marland Kitchen Respiratory: Good respiratory effort with no wheezes, rales, rhonchi. Lungs clear to auscultation bilaterally.  No accessory muscle use. . Abdomen: Soft, nontender, nondistended. Active bowel sounds. No masses or hepatosplenomegaly  . Skin: No rashes, lesions, or ulcerations.  Dry, warm to touch. 2+ dorsalis pedis and radial pulses. . Musculoskeletal: No calf or leg pain. All major joints not erythematous nontender.  No upper or lower joint deformation.  Good ROM.  No contractures  . Psychiatric: Intact judgment and insight. Pleasant and cooperative. . Neurologic: Lower extremities weak in the hamstring and quad muscles: Patient unable to perform straight leg raise.  Hamstrings strength 3 out of 5 bilaterally.  Strength is 5/5 and symmetric in upper and lower extremities.  Cranial nerves II through XII are grossly intact.           Labs on Admission: I have personally reviewed following labs and imaging studies  CBC: Recent Labs  Lab 04/19/18 0834 04/20/18 0637 04/25/18 1319  WBC 7.4 7.2 6.9  NEUTROABS 4.5  --  3.7  HGB 11.3* 9.8* 10.0*  HCT 36.6 31.5* 31.9*  MCV 76.3* 76.1* 76.3*  PLT 232 227 096   Basic Metabolic Panel: Recent Labs  Lab 04/19/18 0834 04/20/18 0637 04/25/18 1319  NA 138 139 139  K 4.1 3.3* 4.1  CL 97* 101 97*  CO2 31 31 34*  GLUCOSE 325* 147* 282*  BUN 25* 23* 25*   CREATININE 2.28* 1.95* 2.28*  CALCIUM 9.1 8.8* 9.1   GFR: Estimated Creatinine Clearance: 21.1 mL/min (A) (by C-G formula based on SCr of 2.28 mg/dL (H)). Liver Function Tests: Recent Labs  Lab 04/19/18 0834 04/25/18 1319  AST 29 26  ALT 26 20  ALKPHOS 51 53  BILITOT 0.5 0.7  PROT 6.7 6.4*  ALBUMIN 3.4* 3.1*   No results for input(s): LIPASE, AMYLASE in the last 168 hours. No results for input(s): AMMONIA in the last 168 hours. Coagulation Profile: No results for input(s): INR, PROTIME in the last 168 hours. Cardiac Enzymes: Recent Labs  Lab 04/19/18 0834 04/19/18 0957 04/20/18 0637 04/25/18 1319  CKTOTAL  --  518* 571*  --   TROPONINI 0.04*  --   --  0.05*   BNP (last 3 results) No results for input(s): PROBNP in the last 8760 hours. HbA1C: No results for input(s): HGBA1C in the last 72 hours. CBG: Recent Labs  Lab 04/20/18 1620 04/20/18 2144 04/21/18 0725 04/21/18 1110 04/21/18 1604  GLUCAP 71 130* 98 118* 175*   Lipid Profile: No results for input(s): CHOL, HDL, LDLCALC, TRIG, CHOLHDL, LDLDIRECT in the last 72 hours. Thyroid Function Tests: No results for input(s): TSH, T4TOTAL, FREET4, T3FREE, THYROIDAB in the last 72 hours. Anemia Panel: No results for input(s): VITAMINB12, FOLATE, FERRITIN, TIBC, IRON, RETICCTPCT in the last 72 hours. Urine analysis:    Component Value Date/Time   COLORURINE YELLOW 04/19/2018 0801   APPEARANCEUR CLEAR 04/19/2018 0801   LABSPEC 1.008 04/19/2018 0801   PHURINE 5.0 04/19/2018 0801   GLUCOSEU >=500 (A) 04/19/2018 0801   HGBUR NEGATIVE 04/19/2018 0801   HGBUR negative 04/14/2009 1408   BILIRUBINUR NEGATIVE 04/19/2018 0801   KETONESUR NEGATIVE 04/19/2018 0801   PROTEINUR 100 (A) 04/19/2018 0801   UROBILINOGEN 0.2 03/05/2014 1106   NITRITE NEGATIVE 04/19/2018 0801   LEUKOCYTESUR NEGATIVE 04/19/2018 0801   Sepsis Labs: @LABRCNTIP (procalcitonin:4,lacticidven:4) ) Recent Results (from the past 240 hour(s))  Urine  culture     Status: None   Collection Time: 04/19/18  8:01 AM  Result Value Ref Range Status   Specimen Description   Final    URINE, CATHETERIZED Performed at [facility], [address], [place], [place] [address]    Special Requests   Final    NONE Performed at [facility], [address], [place], [place] [address]    Culture   Final    NO GROWTH Performed at [facility], [place] [address] [place],  [address]    Report Status 04/20/2018 FINAL  Final     Radiological Exams on Admission: Dg Chest 2 View  Result Date: 04/25/2018 CLINICAL DATA:  Shortness of breath. EXAM: CHEST - 2 VIEW COMPARISON:  Radiographs of April 19, 2018. FINDINGS: The heart size and mediastinal contours are within normal limits. Both lungs are clear. No pneumothorax or pleural effusion is noted. The visualized skeletal structures are unremarkable. IMPRESSION: No active cardiopulmonary disease. Electronically Signed   By: Marijo Conception, M.D.   On: 04/25/2018 14:45   Mr [name] Head Wo Contrast  Result Date: 04/25/2018 CLINICAL DATA:  82 year old female with ataxia and weakness. EXAM: MRI HEAD WITHOUT CONTRAST MRA HEAD WITHOUT CONTRAST TECHNIQUE: Multiplanar, multiecho pulse sequences of the brain and surrounding structures were obtained without intravenous contrast. Angiographic images of the head were obtained using MRA technique without contrast. COMPARISON:  Head CTs without contrast 04/19/2018 and earlier. FINDINGS: MRI HEAD FINDINGS Brain: There is a linear focus of restricted diffusion in the left hemisphere periatrial white matter tracking to the subcortical white matter of the posterior left temporal lobe (series 5, image 45 and series 6, image 11). No associated hemorrhage or mass effect. There is a small round 4 millimeter focus of  abnormal diffusion in the contralateral right posterior hemisphere white matter (series 3, image 73) which is not as clearly restricted on ADC. No other abnormal diffusion,  but widespread and confluent bilateral cerebral white matter T2 and FLAIR hyperintensity. No cortical encephalomalacia or chronic cerebral blood products. The deep gray matter nuclei, brainstem, and cerebellum appear normal for age. Vascular: Major intracranial vascular flow voids are preserved. Skull and upper cervical spine: Negative visible cervical spine. Visualized bone marrow signal is within normal limits. Sinuses/Orbits: Orbit motion artifact, orbits soft tissues appear within normal limits. Paranasal sinuses and mastoids are stable and well pneumatized. Other: Grossly normal visible internal auditory structures. Scalp and face soft tissues appear negative. MRA HEAD FINDINGS Antegrade flow in the posterior circulation with codominant distal vertebral arteries. Patent PICA origins and vertebrobasilar junction. Patent basilar artery without stenosis. Normal SCA and PCA origins. Posterior communicating arteries are diminutive or absent. Normal bilateral PCA branches. Antegrade flow in both ICA siphons. No siphon stenosis. Patent carotid termini. Normal MCA and ACA origins. Diminutive or absent anterior communicating artery. Visible ACA branches are within normal limits. MCA M1 segments, MCA bi/trifurcations, and visible MCA branches are within normal limits. IMPRESSION: 1. Small linear acute lacunar infarct in the white matter of the posterior left temporal lobe. No associated hemorrhage or mass effect. 2. Small subacute appearing white matter lacune in the contralateral right hemisphere. Burtis Junes this reflects synchronous small vessel disease (see #3). 3. Confluent bilateral cerebral white matter T2 and FLAIR hyperintensity is nonspecific but most commonly due to chronic small vessel disease. 4. Normal for age intracranial MRA. Electronically Signed   By: Genevie Ann M.D.   On: 04/25/2018 14:43   Mr Brain Wo Contrast  Result Date: 04/25/2018 CLINICAL DATA:  82 year old female with ataxia and weakness. EXAM: MRI  HEAD WITHOUT CONTRAST MRA HEAD WITHOUT CONTRAST TECHNIQUE: Multiplanar, multiecho pulse sequences of the brain and surrounding structures were obtained without intravenous contrast. Angiographic images of the head were obtained using MRA technique without contrast. COMPARISON:  Head CTs without contrast 04/19/2018 and earlier. FINDINGS: MRI HEAD FINDINGS Brain: There is a linear focus of restricted diffusion in the left hemisphere periatrial white matter tracking to the subcortical white matter of the posterior left temporal lobe (series 5, image 45 and series 6, image 11). No associated hemorrhage or mass effect. There is a small round 4 millimeter focus of abnormal diffusion in the contralateral right posterior hemisphere white matter (series 3, image 73) which is not as clearly restricted on ADC. No other abnormal diffusion, but widespread and confluent bilateral cerebral white matter T2 and FLAIR hyperintensity. No cortical encephalomalacia or chronic cerebral blood products. The deep gray matter nuclei, brainstem, and cerebellum appear normal for age. Vascular: Major intracranial vascular flow voids are preserved. Skull and upper cervical spine: Negative visible cervical spine. Visualized bone marrow signal is within normal limits. Sinuses/Orbits: Orbit motion artifact, orbits soft tissues appear within normal limits. Paranasal sinuses and mastoids are stable and well pneumatized. Other: Grossly normal visible internal auditory structures. Scalp and face soft tissues appear negative. MRA HEAD FINDINGS Antegrade flow in the posterior circulation with codominant distal vertebral arteries. Patent PICA origins and vertebrobasilar junction. Patent basilar artery without stenosis. Normal SCA and PCA origins. Posterior communicating arteries are diminutive or absent. Normal bilateral PCA branches. Antegrade flow in both ICA siphons. No siphon stenosis. Patent carotid termini. Normal MCA and ACA origins. Diminutive or  absent anterior communicating artery. Visible ACA branches are within normal limits. MCA M1  segments, MCA bi/trifurcations, and visible MCA branches are within normal limits. IMPRESSION: 1. Small linear acute lacunar infarct in the white matter of the posterior left temporal lobe. No associated hemorrhage or mass effect. 2. Small subacute appearing white matter lacune in the contralateral right hemisphere. Burtis Junes this reflects synchronous small vessel disease (see #3). 3. Confluent bilateral cerebral white matter T2 and FLAIR hyperintensity is nonspecific but most commonly due to chronic small vessel disease. 4. Normal for age intracranial MRA. Electronically Signed   By: Genevie Ann M.D.   On: 04/25/2018 14:43    EKG: Independently reviewed.  Sinus rhythm with anteroseptal old infarct  Assessment/Plan: Active Problems:   DIASTOLIC DYSFUNCTION, normal LVF   GERD   CKD (chronic kidney disease) stage 3, GFR 30-59 ml/min (HCC)   Essential hypertension   Chronic respiratory failure with hypoxia (HCC)   COPD with chronic bronchitis (HCC)   Type 2 diabetes mellitus with stage 4 chronic kidney disease, with long-term current use of insulin ([place])   Ischemic stroke [facility])    This patient was discussed with the ED physician, including pertinent vitals, physical exam findings, labs, and imaging.  We also discussed care given by the ED provider.  1. Ischemic stroke Admit to [facility] on telemetry Carotid Dopplers  Echocardiogram tomorrow Hemoglobin A1c, lipid panel in the morning PT/OT/speech therapy consult Full aspirin Permissive hypertension  2. Type 2 diabetes a. Continue home insulin. b. CBGs before meals and nightly with sliding scale insulin 3. Chronic respiratory failure with hypoxia a. Continue oxygen support as needed 4. COPD a. Continue inhalers 5. Hypertension a. Hold antihypertensives with permissive hypertension 6. CKD a. Renally dose medications 7. Chronic diastolic heart  failure a. Currently compensated  DVT prophylaxis: Patient was on Xarelto, although low dose for coronary artery disease prevention.  This is insufficient to be considered DVT prophylaxis, so we will hold for now and start on subcutaneous Lovenox Consultants: Neuro Code Status: CODE STATUS discussed with patient -she would like to be considered DNR Family Communication: None Disposition Plan: Pending   Truett Mainland, DO [facility] Pager [phone]  If 7PM-7AM, please contact night-coverage [email or web address] Password [email or web address]

## 2018-04-25 NOTE — ED Triage Notes (Addendum)
Discharged from hospital Monday, pt having weakness, incontinence of urine and on and off confusion.  CBG 200.  Pt is awake and alert.  Pt reports headache

## 2018-04-25 NOTE — ED Provider Notes (Signed)
[facility] EMERGENCY DEPARTMENT Provider Note   CSN: [id number] Arrival date & time: 04/25/18  1241     History   Chief Complaint Chief Complaint  Patient presents with  . Weakness    HPI Krystal [name] is a 82 y.o. female.  HPI Patient states she has been seen in the emergency department several times for similar complaints and was recently admitted.  States she is had 2 months of dizziness which she describes as room spinning sensation intermittently associated with nausea.  She is had increasing lower extremity weakness, shortness of breath especially with lying flat.  She also acknowledges some dysuria but no frequency or urgency.  She denies chest pain.  She does have intermittent mild headaches especially on the right.  Denies visual changes. Past Medical History:  Diagnosis Date  . Allergic rhinitis   . CAD (coronary artery disease) 2009   5 stents- #3 in RCA, #1 each in LAD and AVG  . CHF (congestive heart failure) ([place])   . CKD (chronic kidney disease) stage 3, GFR 30-59 ml/min (HCC)   . Complete heart block, transient 2014  . Diastolic dysfunction, left ventricle   . DJD (degenerative joint disease)   . Emphysema lung (HCC)    2L N/C continuously  . Essential hypertension   . GERD (gastroesophageal reflux disease)   . Glaucoma   . Gout   . History of uterine cancer   . HOH (hard of hearing)   . Hyperlipidemia   . MI (myocardial infarction) ([place]) 1998  . Microcytic anemia    History of occult blood in stool  . Myalgia   . Osteoarthritis   . Palpitations   . Personal history of colonic polyps   . Sleep apnea    CPAP  . Type II diabetes mellitus with nephropathy [facility])     Patient Active Problem List   Diagnosis Date Noted  . Ischemic stroke ([place]) 04/25/2018  . Orthostasis 04/19/2018  . Rhabdomyolysis 04/19/2018  . Hypertensive crisis 11/08/2015  . Anemia, unspecified 11/08/2015  . Syncope 08/19/2015  . Chest pain 08/19/2015  . Anemia 06/08/2015    . Hemorrhoids with complication 29/51/8841  . Essential hypertension 04/15/2015  . Chronic respiratory failure with hypoxia ([place]) 04/15/2015  . COPD with chronic bronchitis ([place]) 04/15/2015  . Near syncope 04/15/2015  . Type 2 diabetes mellitus with stage 4 chronic kidney disease, with long-term current use of insulin ([place]) 04/15/2015  . Dizziness and giddiness 04/14/2015  . Dizziness 04/14/2015  . Abnormal involuntary movements 04/12/2015  . Postmenopausal vaginal bleeding 01/18/2014  . Leg edema 08/15/2013  . Complete heart block, transiet 01/26/2013  . CKD (chronic kidney disease) stage 3, GFR 30-59 ml/min (HCC) 09/18/2012  . OBESITY 05/20/2009  . PERIPHERAL NEUROPATHY 05/20/2009  . NEVUS, ATYPICAL 05/06/2009  . HYPERTENSION, PULMONARY - partially due to LV diastolic dysfunction 66/04/3015  . BRONCHITIS, CHRONIC 12/16/2008  . DIASTOLIC DYSFUNCTION, normal LVF 04/30/2008  . Obstructive sleep apnea 04/30/2008  . Deficiency anemia 09/23/2007  . [place] DISEASE, LUMBOSACRAL SPINE 04/08/2007  . Mixed hyperlipidemia 10/29/2006  . Coronary atherosclerosis 10/29/2006  . ALLERGIC RHINITIS 10/29/2006  . GERD 10/29/2006  . CONSTIPATION, OTHER 10/29/2006  . OSTEOARTHRITIS 10/29/2006  . COLONIC POLYPS, HX OF 10/29/2006    Past Surgical History:  Procedure Laterality Date  . ABDOMINAL HYSTERECTOMY  04/2010   Uterine cancer,TAHBSO  . CERVICAL DISCECTOMY     L5 left/hemilminectomy  . COLONOSCOPY  09/2006   Int hemmorhoids, COMPLICATED BY CARDIOPULMONARY COMPLICATIONS  .  CORONARY ANGIOPLASTY  1/99, 1/07, 1/08, 4/09   5 cardiac stents total  . ECTOPIC PREGNANCY SURGERY    . FOOT SURGERY     Left and right for callous  . LEFT AND RIGHT HEART CATHETERIZATION WITH CORONARY ANGIOGRAM N/A 09/12/2012   Procedure: LEFT AND RIGHT HEART CATHETERIZATION WITH CORONARY ANGIOGRAM;  Surgeon: Sanda Klein, MD;  Location: [facility] CATH LAB;  Service: Cardiovascular;  Laterality: N/A;  .  POLYPECTOMY  10/22/2011   Internal hemorrhoids/sessile polyp     OB History   None      Home Medications    Prior to Admission medications   Medication Sig Start Date End Date Taking? Authorizing Provider  calcitRIOL (ROCALTROL) 0.25 MCG capsule TAKE 1 CAPSULE BY MOUTH ONCE A DAY. 02/05/18  Yes [name], MD  insulin aspart protamine - aspart (NOVOLOG MIX 70/30 FLEXPEN) (70-30) 100 UNIT/ML FlexPen 60 units in the a.m. And 40 units at night 01/22/18  Yes Nida, Marella Chimes, MD  ACCU-CHEK FASTCLIX LANCETS MISC USE AS DIRECTED TWICE DAILY. 12/20/17   Cassandria Anger, MD  aspirin 81 MG EC tablet Take 81 mg by mouth every morning.     [provider]  BD PEN NEEDLE NANO U/F 32G X 4 MM MISC USE AS DIRECTED TWICE DAILY. 06/24/17   Cassandria Anger, MD  budesonide-formoterol (SYMBICORT) 160-4.5 MCG/ACT inhaler Inhale 2 puffs into the lungs 2 (two) times daily.    [provider]  Continuous Blood Gluc Sensor (FREESTYLE LIBRE SENSOR SYSTEM) MISC Use one sensor every 10 days. 06/20/17   Cassandria Anger, MD  ergocalciferol (VITAMIN D2) 50000 units capsule Take 50,000 Units by mouth once a week.    [provider]  ezetimibe (ZETIA) 10 MG tablet TAKE 1 TABLET BY MOUTH EVERY MORNING. 10/18/17   [name], MD  famotidine (PEPCID) 20 MG tablet Take 1 tablet (20 mg total) by mouth daily. 08/23/15   Orvan Falconer, MD  glucose blood (ACCU-CHEK GUIDE) test strip Use as instructed 4 x daily 12/13/17   Cassandria Anger, MD  Insulin Pen Needle (B-D ULTRAFINE III SHORT PEN) 31G X 8 MM MISC 1 each by Does not apply route as directed. 02/23/16   Cassandria Anger, MD  isosorbide mononitrate (IMDUR) 60 MG 24 hr tablet TAKE (1) TABLET BY MOUTH ONCE DAILY. 10/15/17   [name], MD  lisinopril (PRINIVIL,ZESTRIL) 20 MG tablet TAKE (1) TABLET BY MOUTH ONCE DAILY. 10/18/17   [name], MD  loratadine (CLARITIN) 10 MG tablet Take 10 mg by  mouth daily.    [provider]  meclizine (ANTIVERT) 25 MG tablet Take 25 mg by mouth 4 (four) times daily as needed for dizziness. 04/16/17   Sinda Du, MD  metoprolol tartrate (LOPRESSOR) 25 MG tablet Take 1 tablet (25 mg total) by mouth 2 (two) times daily. 01/02/18   [name], MD  nitroGLYCERIN (NITROSTAT) 0.4 MG SL tablet Place 1 tablet (0.4 mg total) under the tongue every 5 (five) minutes as needed for chest pain. 03/08/16   [name], MD  ONETOUCH DELICA LANCETS 73Z MISC USE AS DIRECTED THREE TIMES DAILY. 03/24/18   Cassandria Anger, MD  polyethylene glycol (MIRALAX / Floria Raveling) packet Take 17 g by mouth daily as needed for mild constipation. 11/10/15   Samuella Cota, MD  RANEXA 500 MG 12 hr tablet TAKE (2) TABLETS BY MOUTH TWICE DAILY. 10/15/17   [name], MD  rivaroxaban Alveda Reasons) 2.5  MG TABS tablet Take 1 tablet (2.5 mg total) by mouth 2 (two) times daily. 01/02/18   [name], MD  rosuvastatin (CRESTOR) 20 MG tablet Take 1 tablet (20 mg total) by mouth every evening. 08/23/15   Orvan Falconer, MD  sennosides-docusate sodium (SENOKOT-S) 8.6-50 MG tablet Take 1 tablet by mouth daily as needed for constipation.  04/05/17   [provider]  traMADol (ULTRAM) 50 MG tablet Take 1 tablet (50 mg total) by mouth every 6 (six) hours as needed for moderate pain. 04/28/18   Hosie Poisson, MD    Family History Family History  Problem Relation Age of Onset  . Anesthesia problems Neg Hx   . Hypotension Neg Hx   . Malignant hyperthermia Neg Hx   . Pseudochol deficiency Neg Hx     Social History Social History   Tobacco Use  . Smoking status: Former Smoker    Packs/day: 0.40    Years: 51.00    Pack years: 20.40    Types: Cigarettes    Start date: 04/22/1955    Last attempt to quit: 10/18/2006    Years since quitting: 11.5  . Smokeless tobacco: Never Used  . Tobacco comment: quit about 4 yrs ago  Substance Use Topics  .  Alcohol use: No    Alcohol/week: 0.0 oz  . Drug use: No     Allergies   Morphine; Penicillins; and Shellfish allergy   Review of Systems Review of Systems  Constitutional: Positive for fatigue. Negative for chills and fever.  HENT: Negative for congestion and trouble swallowing.   Eyes: Negative for visual disturbance.  Respiratory: Positive for shortness of breath. Negative for cough and wheezing.   Cardiovascular: Positive for leg swelling. Negative for chest pain and palpitations.  Gastrointestinal: Positive for nausea. Negative for abdominal pain, constipation, diarrhea and vomiting.  Genitourinary: Positive for dysuria. Negative for flank pain, frequency and hematuria.  Musculoskeletal: Positive for gait problem. Negative for back pain, myalgias, neck pain and neck stiffness.  Skin: Negative for rash and wound.  Neurological: Positive for dizziness, weakness, light-headedness and headaches. Negative for numbness.  All other systems reviewed and are negative.    Physical Exam Updated Vital Signs BP (!) 163/70 (BP Location: Left Arm)   Pulse 86   Temp 98.8 F (37.1 C) (Oral)   Resp 16   Ht 5\' 7"  (1.702 m)   Wt 76.9 kg (169 lb 8.5 oz)   SpO2 100%   BMI 26.55 kg/m   Physical Exam  Constitutional: She is oriented to person, place, and time. She appears well-developed and well-nourished. No distress.  HENT:  Head: Normocephalic and atraumatic.  Mouth/Throat: Oropharynx is clear and moist. No oropharyngeal exudate.  Eyes: Pupils are equal, round, and reactive to light. EOM are normal.  Few beats of horizontal nystagmus when looking to the right.  Neck: Normal range of motion. Neck supple.  Cardiovascular: Normal rate and regular rhythm. Exam reveals no gallop and no friction rub.  No murmur heard. Pulmonary/Chest: Effort normal. She has rales.  Crackles in bilateral bases right greater than left.  No respiratory distress.  Abdominal: Soft. Bowel sounds are normal.  There is no tenderness. There is no rebound and no guarding.  Musculoskeletal: Normal range of motion. She exhibits no edema or tenderness.  Mild ankle swelling bilaterally.  No calf swelling or tenderness.  Distal pulses are 2+.  Lymphadenopathy:    She has no cervical adenopathy.  Neurological: She is alert and oriented to person,  place, and time.  5/5 bilateral upper extremities.  Bilateral finger-to-nose intact.  2/5 right lower extremity and 3/5 left lower extremity.  Sensation to light touch intact grossly.  Skin: Skin is warm and dry. Capillary refill takes less than 2 seconds. No rash noted. She is not diaphoretic. No erythema.  Psychiatric: Her behavior is normal.  Flat affect  Nursing note and vitals reviewed.    ED Treatments / Results  Labs (all labs ordered are listed, but only abnormal results are displayed) Labs Reviewed  CBC WITH DIFFERENTIAL/PLATELET - Abnormal; Notable for the following components:      Result Value   Hemoglobin 10.0 (*)    HCT 31.9 (*)    MCV 76.3 (*)    MCH 23.9 (*)    RDW 15.9 (*)    All other components within normal limits  COMPREHENSIVE METABOLIC PANEL - Abnormal; Notable for the following components:   Chloride 97 (*)    CO2 34 (*)    Glucose, Bld 282 (*)    BUN 25 (*)    Creatinine, Ser 2.28 (*)    Total Protein 6.4 (*)    Albumin 3.1 (*)    GFR calc non Af Amer 19 (*)    GFR calc Af Amer 22 (*)    All other components within normal limits  BRAIN NATRIURETIC PEPTIDE - Abnormal; Notable for the following components:   B Natriuretic Peptide 739.0 (*)    All other components within normal limits  TROPONIN I - Abnormal; Notable for the following components:   Troponin I 0.05 (*)    All other components within normal limits  URINALYSIS, ROUTINE W REFLEX MICROSCOPIC - Abnormal; Notable for the following components:   Glucose, UA 50 (*)    Hgb urine dipstick SMALL (*)    Protein, ur 100 (*)    Leukocytes, UA SMALL (*)    Bacteria, UA  RARE (*)    All other components within normal limits  HEMOGLOBIN A1C - Abnormal; Notable for the following components:   Hgb A1c MFr Bld 10.9 (*)    All other components within normal limits  LIPID PANEL - Abnormal; Notable for the following components:   HDL 30 (*)    All other components within normal limits  GLUCOSE, CAPILLARY - Abnormal; Notable for the following components:   Glucose-Capillary 215 (*)    All other components within normal limits  GLUCOSE, CAPILLARY - Abnormal; Notable for the following components:   Glucose-Capillary 214 (*)    All other components within normal limits  GLUCOSE, CAPILLARY - Abnormal; Notable for the following components:   Glucose-Capillary 177 (*)    All other components within normal limits  GLUCOSE, CAPILLARY - Abnormal; Notable for the following components:   Glucose-Capillary 37 (*)    All other components within normal limits  GLUCOSE, CAPILLARY - Abnormal; Notable for the following components:   Glucose-Capillary 165 (*)    All other components within normal limits  GLUCOSE, CAPILLARY - Abnormal; Notable for the following components:   Glucose-Capillary 111 (*)    All other components within normal limits  GLUCOSE, CAPILLARY - Abnormal; Notable for the following components:   Glucose-Capillary 114 (*)    All other components within normal limits  BASIC METABOLIC PANEL - Abnormal; Notable for the following components:   Chloride 95 (*)    Glucose, Bld 204 (*)    BUN 26 (*)    Creatinine, Ser 2.80 (*)    GFR calc non Af  Amer 15 (*)    GFR calc Af Amer 17 (*)    All other components within normal limits  CK - Abnormal; Notable for the following components:   Total CK 477 (*)    All other components within normal limits  GLUCOSE, CAPILLARY - Abnormal; Notable for the following components:   Glucose-Capillary 158 (*)    All other components within normal limits  GLUCOSE, CAPILLARY - Abnormal; Notable for the following components:    Glucose-Capillary 135 (*)    All other components within normal limits  GLUCOSE, CAPILLARY - Abnormal; Notable for the following components:   Glucose-Capillary 100 (*)    All other components within normal limits  GLUCOSE, CAPILLARY - Abnormal; Notable for the following components:   Glucose-Capillary 101 (*)    All other components within normal limits  GLUCOSE, CAPILLARY  GLUCOSE, CAPILLARY    EKG EKG Interpretation  Date/Time:  Friday April 25 2018 12:51:45 EDT Ventricular Rate:  78 PR Interval:    QRS Duration: 99 QT Interval:  425 QTC Calculation: 485 R Axis:   5 Text Interpretation:  Sinus rhythm Supraventricular bigeminy Anteroseptal infarct, old Confirmed by Julianne Rice [phone]) on 04/25/2018 3:26:04 PM   Radiology No results found.  Procedures Procedures (including critical care time)  Medications Ordered in ED Medications  calcitRIOL (ROCALTROL) capsule 0.25 mcg (0.25 mcg Oral Given 04/28/18 0920)  ezetimibe (ZETIA) tablet 10 mg (10 mg Oral Given 04/28/18 0918)  famotidine (PEPCID) tablet 20 mg (20 mg Oral Given 04/28/18 0918)  loratadine (CLARITIN) tablet 10 mg (10 mg Oral Given 04/28/18 0920)  meclizine (ANTIVERT) tablet 25 mg (has no administration in time range)  polyethylene glycol (MIRALAX / GLYCOLAX) packet 17 g (17 g Oral Given 04/26/18 1754)  ranolazine (RANEXA) 12 hr tablet 500 mg (500 mg Oral Given 04/28/18 0920)  rosuvastatin (CRESTOR) tablet 20 mg (20 mg Oral Given 04/27/18 1814)  acetaminophen (TYLENOL) tablet 650 mg (650 mg Oral Given 04/28/18 1619)    Or  acetaminophen (TYLENOL) solution 650 mg ( Per Tube See Alternative 04/28/18 1619)    Or  acetaminophen (TYLENOL) suppository 650 mg ( Rectal See Alternative 04/28/18 1619)  senna-docusate (Senokot-S) tablet 1 tablet (1 tablet Oral Given 04/26/18 1754)  insulin aspart (novoLOG) injection 0-15 Units (0 Units Subcutaneous Not Given 04/28/18 1200)  insulin aspart (novoLOG) injection 0-5 Units (0 Units  Subcutaneous Not Given 04/27/18 2105)  hydrALAZINE (APRESOLINE) injection 5 mg (5 mg Intravenous Given 04/28/18 0408)  mometasone-formoterol (DULERA) 200-5 MCG/ACT inhaler 2 puff (2 puffs Inhalation Given 04/28/18 0856)  insulin aspart protamine- aspart (NOVOLOG MIX 70/30) injection 35 Units (35 Units Subcutaneous Given 04/28/18 0917)    And  insulin aspart protamine- aspart (NOVOLOG MIX 70/30) injection 40 Units (40 Units Subcutaneous Given 04/27/18 1815)  Rivaroxaban (XARELTO) tablet 15 mg (15 mg Oral Given 04/27/18 1814)  aspirin EC tablet 81 mg (81 mg Oral Given 04/28/18 1619)   stroke: mapping our early stages of recovery book ( Does not apply Given 04/25/18 2300)  dextrose 50 % solution (50 mLs  Given 04/26/18 1127)     Initial Impression / Assessment and Plan / ED Course  I have reviewed the triage vital signs and the nursing notes.  Pertinent labs & imaging results that were available during my care of the patient were reviewed by me and considered in my medical decision making (see chart for details).     Unclear onset of symptoms though appears to have lateralizing right leg weakness compared  to left.  MRI with acute lacunar infarct and subacute infarct.  Discussed with Dr. Sheran Lawless who will see patient in the emergency department.  Plans to transfer to [facility].    Final Clinical Impressions(s) / ED Diagnoses   Final diagnoses:  Acute ischemic stroke [facility])    ED Discharge Orders        Ordered    traMADol (ULTRAM) 50 MG tablet  Every 6 hours PRN     04/28/18 0932    Diet - low sodium heart healthy     04/28/18 0933    Discharge instructions    Comments:  Please follow up with neurology as recommended.   04/28/18 7510       Julianne Rice, MD 04/28/18 Bosie Helper

## 2018-04-25 NOTE — ED Notes (Signed)
CRITICAL VALUE ALERT  Critical Value:  Troponin 0.05  Date & Time Notied:  04/25/2018 at 1423  Provider Notified: dr. Lita Mains   Orders Received/Actions taken:

## 2018-04-25 NOTE — ED Notes (Signed)
Pt able to pivot from wc to toilet with 1 person assist

## 2018-04-25 NOTE — ED Notes (Signed)
Pt attempted to give urine sample. Unable to currently give sample.

## 2018-04-26 ENCOUNTER — Other Ambulatory Visit (HOSPITAL_COMMUNITY): Payer: Medicare Other

## 2018-04-26 ENCOUNTER — Inpatient Hospital Stay (HOSPITAL_COMMUNITY): Payer: Medicare Other

## 2018-04-26 DIAGNOSIS — N183 Chronic kidney disease, stage 3 (moderate): Secondary | ICD-10-CM

## 2018-04-26 DIAGNOSIS — I639 Cerebral infarction, unspecified: Secondary | ICD-10-CM

## 2018-04-26 DIAGNOSIS — I5030 Unspecified diastolic (congestive) heart failure: Secondary | ICD-10-CM

## 2018-04-26 LAB — LIPID PANEL
CHOL/HDL RATIO: 4.4 ratio
Cholesterol: 132 mg/dL (ref 0–200)
HDL: 30 mg/dL — AB (ref >40)
LDL Cholesterol: 81 mg/dL (ref 0–99)
Triglycerides: 105 mg/dL (ref <150)
VLDL: 21 mg/dL (ref 0–40)

## 2018-04-26 LAB — GLUCOSE, CAPILLARY
GLUCOSE-CAPILLARY: 37 mg/dL — AB (ref 65–99)
Glucose-Capillary: 214 mg/dL — ABNORMAL HIGH (ref 65–99)
Glucose-Capillary: 177 mg/dL — ABNORMAL HIGH (ref 65–99)
Glucose-Capillary: 165 mg/dL — ABNORMAL HIGH (ref 65–99)
Glucose-Capillary: 70 mg/dL (ref 65–99)
Glucose-Capillary: 111 mg/dL — ABNORMAL HIGH (ref 65–99)

## 2018-04-26 LAB — HEMOGLOBIN A1C
Hgb A1c MFr Bld: 10.9 % — ABNORMAL HIGH (ref 4.8–5.6)
MEAN PLASMA GLUCOSE: 266.13 mg/dL

## 2018-04-26 LAB — ECHOCARDIOGRAM COMPLETE
HEIGHTINCHES: 67 in
WEIGHTICAEL: 2712.54 [oz_av]

## 2018-04-26 MED ORDER — RIVAROXABAN 2.5 MG PO TABS
2.5000 mg | ORAL_TABLET | Freq: Two times a day (BID) | ORAL | Status: DC
  Administered 2018-04-26 – 2018-04-27 (×2): 2.5 mg via ORAL
  Filled 2018-04-26 (×4): qty 1

## 2018-04-26 MED ORDER — DEXTROSE 50 % IV SOLN
INTRAVENOUS | Status: AC
  Administered 2018-04-26: 50 mL
  Filled 2018-04-26: qty 50

## 2018-04-26 MED ORDER — INSULIN ASPART PROT & ASPART (70-30 MIX) 100 UNIT/ML ~~LOC~~ SUSP
40.0000 [IU] | Freq: Every day | SUBCUTANEOUS | Status: DC
  Administered 2018-04-27: 40 [IU] via SUBCUTANEOUS

## 2018-04-26 MED ORDER — INSULIN ASPART PROT & ASPART (70-30 MIX) 100 UNIT/ML ~~LOC~~ SUSP
35.0000 [IU] | Freq: Every day | SUBCUTANEOUS | Status: DC
  Administered 2018-04-27 – 2018-04-28 (×2): 35 [IU] via SUBCUTANEOUS
  Filled 2018-04-26 (×2): qty 10

## 2018-04-26 NOTE — Consult Note (Addendum)
Referring Physician: Hosie Poisson, MD    Chief Complaint: Weakness  HPI: Krystal [name] is an 82 y.o. female with a past medical history of stage IV chronic kidney disease, uncontrolled diabetes mellitus-insulin-dependent, GERD, coronary artery disease on low-dose Xarelto, hypertension, chronic diastolic heart failure, dyslipidemia as well as a recent hospitalization, with discharge 04/21/18 with syncope and rhabdomyolysis.  Patient came to the ED with complaints of 2 months of dizziness which she describes as the room spinning with intermittent nausea as well as right lower extremity weakness.  Patient on assessment is very lethargic, she status post a blood glucose 37 in the last 30 minutes and minimally answers questions.  HPI and review of systems obtained from chart.  On admission, patient's creatinine was at baseline, chest x-ray was within normal limits, MRI of the brain showed a small linear acute lacunar infarct in the left temporal lobe as well as a subacute lacunar infarct in the right hemisphere as well as a white matter lesions due to chronic small vessel disease.  LSN: 04/25/18 tPA Given: No: NO LVO  Baseline Premorbid modified Rankin scale (mRS): 1  Past Medical History:  Diagnosis Date  . Allergic rhinitis   . CAD (coronary artery disease) 2009   5 stents- #3 in RCA, #1 each in LAD and AVG  . CHF (congestive heart failure) ([place])   . CKD (chronic kidney disease) stage 3, GFR 30-59 ml/min (HCC)   . Complete heart block, transient 2014  . Diastolic dysfunction, left ventricle   . DJD (degenerative joint disease)   . Emphysema lung (HCC)    2L N/C continuously  . Essential hypertension   . GERD (gastroesophageal reflux disease)   . Glaucoma   . Gout   . History of uterine cancer   . HOH (hard of hearing)   . Hyperlipidemia   . MI (myocardial infarction) ([place]) 1998  . Microcytic anemia    History of occult blood in stool  . Myalgia   . Osteoarthritis   . Palpitations    . Personal history of colonic polyps   . Sleep apnea    CPAP  . Type II diabetes mellitus with nephropathy [facility])     Past Surgical History:  Procedure Laterality Date  . ABDOMINAL HYSTERECTOMY  04/2010   Uterine cancer,TAHBSO  . CERVICAL DISCECTOMY     L5 left/hemilminectomy  . COLONOSCOPY  09/2006   Int hemmorhoids, COMPLICATED BY CARDIOPULMONARY COMPLICATIONS  . CORONARY ANGIOPLASTY  1/99, 1/07, 1/08, 4/09   5 cardiac stents total  . ECTOPIC PREGNANCY SURGERY    . FOOT SURGERY     Left and right for callous  . LEFT AND RIGHT HEART CATHETERIZATION WITH CORONARY ANGIOGRAM N/A 09/12/2012   Procedure: LEFT AND RIGHT HEART CATHETERIZATION WITH CORONARY ANGIOGRAM;  Surgeon: Sanda Klein, MD;  Location: [facility];  Service: Cardiovascular;  Laterality: N/A;  . POLYPECTOMY  10/22/2011   Internal hemorrhoids/sessile polyp    Family History  Problem Relation Age of Onset  . Anesthesia problems Neg Hx   . Hypotension Neg Hx   . Malignant hyperthermia Neg Hx   . Pseudochol deficiency Neg Hx    Social History:  reports that she quit smoking about 11 years ago. Her smoking use included cigarettes. She started smoking about 63 years ago. She has a 20.40 pack-year smoking history. She has never used smokeless tobacco. She reports that she does not drink alcohol or use drugs.  Allergies:  Allergies  Allergen Reactions  . Morphine  Shortness Of Breath and Swelling  . Penicillins Shortness Of Breath and Swelling    Has patient had a PCN reaction causing immediate rash, facial/tongue/throat swelling, SOB or lightheadedness with hypotension: No Has patient had a PCN reaction causing severe rash involving mucus membranes or skin necrosis: No Has patient had a PCN reaction that required hospitalization Yes Has patient had a PCN reaction occurring within the last 10 years: No If all of the above answers are "NO", then may proceed with Cephalosporin use.   . Shellfish Allergy      Medications:  . aspirin EC  81 mg Oral q morning - 10a  . calcitRIOL  0.25 mcg Oral Daily  . enoxaparin (LOVENOX) injection  30 mg Subcutaneous Q24H  . ezetimibe  10 mg Oral q morning - 10a  . famotidine  20 mg Oral Daily  . insulin aspart  0-15 Units Subcutaneous TID WC  . insulin aspart  0-5 Units Subcutaneous QHS  . insulin aspart protamine- aspart  70 Units Subcutaneous Q breakfast   And  . insulin aspart protamine- aspart  40 Units Subcutaneous Q supper  . loratadine  10 mg Oral Daily  . mometasone-formoterol  2 puff Inhalation BID  . ranolazine  500 mg Oral BID  . rosuvastatin  20 mg Oral QPM    ROS: Unable to accurately obtain due to patient being very somnolent and lethargic  Physical Examination: Blood pressure (!) 136/48, pulse 89, temperature 98.2 F (36.8 C), temperature source Oral, resp. rate 20, height 5\' 7"  (1.702 m), weight 76.9 kg (169 lb 8.5 oz), SpO2 100 %. HEENT-  Normocephalic, no lesions, without obvious abnormality.  Normal external eye and conjunctiva.   Cardiovascular- S1-S2 audible, pulses palpable throughout   Lungs-no rhonchi or wheezing noted, no excessive working breathing.  Saturations within normal limits Abdomen- All 4 quadrants palpated and nontender Musculoskeletal-no joint tenderness, deformity or swelling Skin-warm and dry  Neurological Examination Mental Status: Patient is currently lethargic recently had a blood glucose at 37 in the last 30 minutes.  Patient is easily arousable though and follows simple commands with repeated requests and coaching. Delayed responses to verbal commands and questions. With severely hypophonic speech she is able to articulate the month, year, place and hospital; the hypophonia appears most likely to be volitional. She has a flattened affect and appears dysthymic and abulic.   Cranial Nerves: II: Visual fields grossly normal, PERRL III,IV, VI: Able to gaze to left and right while tracking examiner's  face V,VII: No facial asymmetry noted VIII: Hearing intact to voice XI: No asymmetry noted XII: midline tongue extension Motor: With generalized weakness and effort dependence. Maximum strength elicited is 4/5 in bilateral upper extremities with easy fatiguability. The patient is not cooperative with lower extremity exam commands on initial NP exam but will elevate legs to command with attending and does withdraw to noxious without asymmetry.  Sensory: Patient minimally  winces to noxious stimuli bilaterally Deep Tendon Reflexes: 2+ and symmetric throughout Plantars: Right: downgoing   Left: downgoing Cerebellar: Bradykinetic FNF appears to be secondary to poor effort. No ataxia noted. Gait: Not assessed due to safety  Imaging Dg Chest 2 View 04/25/2018 IMPRESSION:  No active cardiopulmonary disease.   MRI Brain/ Mra Head Wo Contrast 04/25/2018 IMPRESSION:  1. Small linear acute lacunar infarct in the white matter of the posterior left temporal lobe. No associated hemorrhage or mass effect.  2. Small subacute appearing white matter lacune in the contralateral right hemisphere. Burtis Junes this reflects  synchronous small vessel disease (see #3).  3. Confluent bilateral cerebral white matter T2 and FLAIR hyperintensity is nonspecific but most commonly due to chronic small vessel disease.  4. Normal for age intracranial MRA.    Assessment: 82 y.o. female ith a past medical history of stage IV chronic kidney disease, uncontrolled diabetes mellitus-insulin-dependent, GERD, coronary artery disease on low-dose Xarelto, hypertension, chronic diastolic heart failure, dyslipidemia as well as a recent hospitalization, with discharge 04/21/18 with syncope and rhabdomyolysis.  Patient comes into the ED with complaints of 2 months of dizziness as well as right lower extremity weakness of unknown start date. 1.  Acute ischemic stroke, MRI showed small vessel disease an acute left temporal lobe microinfarct as well as  a right centrum semiovale subcentimeterl subacute ischemic infarct.   2. At baseline patient takes a low-dose Xarelto 2.5 mg twice daily due to renal function, and patient did stroke while on anticoagulation.  Certainly patient has high risk for stroke with a history of uncontrolled diabetes hemoglobin A1c 10.9 and LDL 81.  Will complete further stroke work-up with echocardiogram as well as carotid Dopplers.  Will maintain permissive hypertension for the next 24 hours and maintain normotensive blood pressure afterwards with 25% daily reduction in BP towards that goal, if necessary.  Will order early rehabilitation with physical therapy, patient therapy and speech therapy. Stroke Risk Factors - diabetes mellitus, hyperlipidemia and hypertension 3.  Hypertension 4.  Dyslipidemia 5. CAD/ Chronic Diastolic CHF 6. CKD IV 7. Uncontrolled diabetes mellitus, appears labile   Plan: - Continue rivaroxaban - Continue PT consult, OT consult, Speech consult - Echocardiogram pending - Carotid dopplers pending - Statin- Crestor 20mg  - Allow for modified permissive hypertension for the first 24h - only treat PRN if SBP >180 mmHg. Blood pressures can be gradually normalized to SBP<140 upon discharge - Risk factor modification - Telemetry monitoring -  Frequent neuro checks -Tight glycemic control without hypoglycemia episode -Continue cardioprotective medications -Avoid nephrotoxic drugs  Letha Cape DNP Neuro-hospitalist Team [phone] 04/26/2018, 12:11 PM  I have seen and examined the patient. I have amended the assessment and plan above.  Electronically signed: Dr. Kerney Elbe

## 2018-04-26 NOTE — Progress Notes (Signed)
PT/ OT called me in the room, patient in a sweating with BP 228/79. Patient drowsy and minimally interactive with staff. Blood sugar checked and it was 37. Amp of 50% dextrose given. Dr. Karleen Hampshire made aware via phone call.

## 2018-04-26 NOTE — Evaluation (Signed)
Occupational Therapy Evaluation Patient Details Name: Krystal [name] MRN: [id number] DOB: 05-Sep-1936 Today's Date: 04/26/2018    History of Present Illness 82 y.o. female with a history of stage IV chronic kidney disease, insulin-dependent diabetes, GERD, hypertension, coronary artery disease, chronic diastolic heart failure, hyperlipidemia.  Patient recently hospitalized last week due to syncopal episode.  She was admitted on 04/19/2018 and was discharged on 6/3.  During that hospitalization she was treated for rhabdomyolysis, and the syncopal episode.  Her findings were nonspecific.  She returns today complaining of 2 months of dizziness, described as room spinning sensation with intermittent nausea.  Her lower extremity weakness, especially on the right lower leg.  No palliating or provoking factors.  Her symptoms are worsening.  She has had some mild headaches, especially on the right side.  Denies vision changes, weakness in the upper extremities per MD note. MRI revealing small linear acute lacunar infarct in white matter of the posterior L temporal lobe. MRI revealing small linear acute lacunar infarct in white matter of the posterior L temporal lobe.   Clinical Impression   Per chart, pt ambulates with quad-cane in her home environment, has assistance for ADL from aide 5 days/week, and lives alone. She was seen in conjunction with PT in hopes to maximize functional gains during session. On arrival, pt presenting to OT evaluation highly lethargic, poorly responsive, with slurred speech, and difficulty following commands. She requires total assistance for all ADL at this time. She was able to demonstrate focused attention to OT and state that she was in the hospital. This is a noted significant change in status and pt noted to be sweating, reporting feeling poorly and with BP 228/79 and HR mid 80s. Called RN to room to assess who paged MD from room. Per RN, pt found to have blood sugar level of 37.  She continues to monitor pt.  At current functional level, recommend SNF level rehabilitation and 24 hour assistance. Will continue to update recommendations based on pt progress.     Follow Up Recommendations  SNF;Supervision/Assistance - 24 hour    Equipment Recommendations  Other (comment)(TBD at next venue of care)    Recommendations for Other Services       Precautions / Restrictions Precautions Precautions: Fall Restrictions Weight Bearing Restrictions: No      Mobility Bed Mobility Overal bed mobility: Needs Assistance             General bed mobility comments: Mechanical features of bed to bring patient into upright, long sitting in bed, patient unable to engage in activity. Pt also noted to be very sweaty/diaphoretic during session.   Transfers                 General transfer comment: Unable to attempt at this time.     Balance                                           ADL either performed or assessed with clinical judgement   ADL Overall ADL's : Needs assistance/impaired                                       General ADL Comments: At this time, pt requiring total assist +2 for all ADL participation.      Vision  Vision Assessment?: Vision impaired- to be further tested in functional context Additional Comments: Unable to focus on target today. When opening eyes noted roving eye movement. Able to track across midline.     Perception     Praxis      Pertinent Vitals/Pain Pain Assessment: (shook head seeming to indicate "no" when asked)     Hand Dominance     Extremity/Trunk Assessment Upper Extremity Assessment Upper Extremity Assessment: RUE deficits/detail;LUE deficits/detail;Difficult to assess due to impaired cognition RUE Deficits / Details: Withdraws to pain. Minimal movement of RUE. Some spontaneous movement noted. Minimal on command.  LUE Deficits / Details: Withdraws to pain. Minimal movement  of RUE. Some spontaneous movement noted. Minimal on command.    Lower Extremity Assessment Lower Extremity Assessment: Defer to PT evaluation   Cervical / Trunk Assessment Cervical / Trunk Assessment: (increased body habitus)   Communication Communication Communication: Expressive difficulties(pt with slurred speech throughout; RN notified and present)   Cognition Arousal/Alertness: Lethargic   Overall Cognitive Status: Impaired/Different from baseline Area of Impairment: Attention;Memory;Following commands;Safety/judgement;Awareness                   Current Attention Level: Focused Memory: Decreased short-term memory Following Commands: Follows one step commands inconsistently Safety/Judgement: Decreased awareness of safety;Decreased awareness of deficits     General Comments: Pt highly lethargic on PT/OT arrival. She was unable to answer questions or maintain arousal long enough to maintain conversation. RN called to room reporting that pt's speech and cognition is changed since her previous assessment. She paged MD.    General Comments  Pt noted to be sweaty, with poor responsivity and having slurred speech. Vitals taken with BP 228/79, HR in the mid 80s. RN called to room who paged MD.     Exercises     Shoulder Fisher expects to be discharged to:: Private residence Living Arrangements: Alone Available Help at Discharge: Personal care attendant Type of Home: Apartment Home Access: Level entry     Home Layout: One [place]: [place] - quad;Shower seat;Bedside commode   Additional Comments: pt unable to report and thus all history and environmental set up taken from previous admission notes and chart review      Prior Functioning/Environment Level of Independence: Needs assistance  Gait / Transfers Assistance Needed: household ambulator with quad-cane ADL's / Homemaking Assistance Needed:  home aides from 9am to 1pm 5 days/week   Comments: all history and environmental set up taken from previous admission notes and chart review due to pt unable to report         OT Problem List: Decreased activity tolerance;Decreased cognition;Decreased safety awareness;Decreased knowledge of use of DME or AE;Decreased knowledge of precautions;Impaired vision/perception;Cardiopulmonary status limiting activity      OT Treatment/Interventions: Self-care/ADL training;Therapeutic exercise;Energy conservation;DME and/or AE instruction;Therapeutic activities;Patient/family education;Balance training    OT Goals(Current goals can be found in the care plan section) Acute Rehab OT Goals Patient Stated Goal: none stated OT Goal Formulation: Patient unable to participate in goal setting Time For Goal Achievement: 05/10/18 Potential to Achieve Goals: Fair ADL Goals Pt Will Perform Grooming: with min assist;bed level Additional ADL Goal #1: Pt will complete bed mobility to sit at EOB with overall mod assist in preparation for ADL participation. Additional ADL Goal #2: Pt will demonstrate sustained attention to grooming tasks in a quiet, non-distracting environment.  OT Frequency: Min 2X/week   Barriers to D/C:            Co-evaluation PT/OT/SLP Co-Evaluation/Treatment: Yes Reason for Co-Treatment: Complexity of the patient's impairments (multi-system involvement);For patient/therapist safety;To address functional/ADL transfers   OT goals addressed during session: ADL's and self-care;Strengthening/ROM      AM-PAC PT "6 Clicks" Daily Activity     Outcome Measure Help from another person eating meals?: Total Help from another person taking care of personal grooming?: Total Help from another person toileting, which includes using toliet, bedpan, or urinal?: Total Help from another person bathing (including washing, rinsing, drying)?: Total Help from another person to put on and taking off  regular upper body clothing?: Total Help from another person to put on and taking off regular lower body clothing?: Total 6 Click Score: 6   End of Session Nurse Communication: Mobility status  Activity Tolerance: Treatment limited secondary to medical complications (Comment)(pt diaphoretic, with increased BP and decreased responsivity) Patient left: in bed;with call bell/phone within reach;with nursing/sitter in room;with bed alarm set  OT Visit Diagnosis: Muscle weakness (generalized) (M62.81);Other symptoms and signs involving cognitive function                Time: 7412-8786 OT Time Calculation (min): 20 min Charges:  OT General Charges $OT Visit: 1 Visit OT Evaluation $OT Eval Moderate Complexity: 1 Mod G-Codes:     Norman Herrlich, MS OTR/L  Pager: [place] A Nillie Bartolotta 04/26/2018, 12:13 PM

## 2018-04-26 NOTE — Evaluation (Signed)
Physical Therapy Evaluation Patient Details Name: Krystal [name] MRN: [id number] DOB: [birth year] Today's Date: 04/26/2018   History of Present Illness  82 y.o. female with a history of stage IV chronic kidney disease, insulin-dependent diabetes, GERD, hypertension, coronary artery disease, chronic diastolic heart failure, hyperlipidemia.  Patient recently hospitalized last week due to syncopal episode.  She was admitted on 04/19/2018 and was discharged on 6/3.  During that hospitalization she was treated for rhabdomyolysis, and the syncopal episode.  Her findings were nonspecific.  She returns today complaining of 2 months of dizziness, described as room spinning sensation with intermittent nausea.  Her lower extremity weakness, especially on the right lower leg.  No palliating or provoking factors.  Her symptoms are worsening.  She has had some mild headaches, especially on the right side.  Denies vision changes, weakness in the upper extremities per MD note.  Clinical Impression  Orders received for PT evaluation. Patient demonstrates deficits in functional mobility as indicated below. Will benefit from continued skilled PT to address deficits and maximize function. Will see as indicated and progress as tolerated. Limited assessment at this time.  OF NOTE: Nursing called to room, patient minimally responsive throughout session, BP assessed with 228/79. HR mid 80s. Patient extremely diaphoretic. MD paged.      Follow Up Recommendations SNF;Supervision for mobility/OOB    Equipment Recommendations  (TBD)    Recommendations for Other Services       Precautions / Restrictions Restrictions Weight Bearing Restrictions: No      Mobility  Bed Mobility Overal bed mobility: Needs Assistance             General bed mobility comments: mechanical features of bed to bring patient into upright, lond sitting in bed, patient unable to engage in activity  Transfers                     Ambulation/Gait                Stairs            Wheelchair Mobility    Modified Rankin (Stroke Patients Only)       Balance                                             Pertinent Vitals/Pain      Home Living Family/patient expects to be discharged to:: Private residence Living Arrangements: Alone Available Help at Discharge: Personal care attendant Type of Home: Apartment Home Access: Level entry     Home Layout: One level Home Equipment: [place] - quad;Shower seat;Bedside commode Additional Comments: all history and environmental set up taken from previous admission notes and chart review    Prior Function Level of Independence: Needs assistance   Gait / Transfers Assistance Needed: household ambulator with quad-cane  ADL's / Homemaking Assistance Needed: home aides from 9am to 1pm 5 days/week  Comments: all history and environmental set up taken from previous admission notes and chart review     Hand Dominance        Extremity/Trunk Assessment   Upper Extremity Assessment Upper Extremity Assessment: Defer to OT evaluation;Difficult to assess due to impaired cognition(pain withdrawl and trace movements noted limited by lethargy)    Lower Extremity Assessment Lower Extremity Assessment: Generalized weakness;Difficult to assess due to impaired cognition(pain withdrawl and trace movements noted limited by lethargy)  Cervical / Trunk Assessment Cervical / Trunk Assessment: (increased body habitus)  Communication   Communication: Expressive difficulties(patient with some slurred speech during evaluation, nsg noti)  Cognition Arousal/Alertness: Lethargic   Overall Cognitive Status: Impaired/Different from baseline Area of Impairment: Attention;Memory;Following commands;Safety/judgement;Awareness                   Current Attention Level: Focused Memory: Decreased short-term memory Following Commands: Follows one step  commands inconsistently Safety/Judgement: Decreased awareness of safety;Decreased awareness of deficits     General Comments: patient cognitive assessment limited due to lethargy throughout session      General Comments      Exercises     Assessment/Plan    PT Assessment Patient needs continued PT services  PT Problem List Decreased strength;Decreased activity tolerance;Decreased balance;Decreased mobility       PT Treatment Interventions Gait training;Functional mobility training;Therapeutic activities;Therapeutic exercise;Patient/family education    PT Goals (Current goals can be found in the Care Plan section)  Acute Rehab PT Goals Patient Stated Goal: none stated PT Goal Formulation: Patient unable to participate in goal setting Time For Goal Achievement: 05/10/18 Potential to Achieve Goals: Fair    Frequency Min 3X/week   Barriers to discharge        Co-evaluation PT/OT/SLP Co-Evaluation/Treatment: Yes Reason for Co-Treatment: Complexity of the patient's impairments (multi-system involvement);Necessary to address cognition/behavior during functional activity;For patient/therapist safety           AM-PAC PT "6 Clicks" Daily Activity  Outcome Measure Difficulty turning over in bed (including adjusting bedclothes, sheets and blankets)?: Unable Difficulty moving from lying on back to sitting on the side of the bed? : Unable Difficulty sitting down on and standing up from a chair with arms (e.g., wheelchair, bedside commode, etc,.)?: Unable Help needed moving to and from a bed to chair (including a wheelchair)?: Total Help needed walking in hospital room?: Total Help needed climbing 3-5 steps with a railing? : Total 6 Click Score: 6    End of Session Equipment Utilized During Treatment: Oxygen Activity Tolerance: Patient limited by lethargy;Treatment limited secondary to medical complications (Comment)(elevated BP 228/79, nsg aware) Patient left: in bed;with  call bell/phone within reach;with nursing/sitter in room Nurse Communication: Mobility status(minimal responsiveness) PT Visit Diagnosis: Unsteadiness on feet (R26.81);Other abnormalities of gait and mobility (R26.89);Muscle weakness (generalized) (M62.81)    Time: 2725-3664 PT Time Calculation (min) (ACUTE ONLY): 18 min   Charges:   PT Evaluation $PT Eval Moderate Complexity: 1 Mod     PT G Codes:        Alben Deeds, PT DPT  Board Certified Neurologic Specialist [place] 04/26/2018, 11:40 AM

## 2018-04-26 NOTE — Progress Notes (Signed)
Patient states she has not had a bowel movement in "about a week". PRN miralax and senakot given.

## 2018-04-26 NOTE — Progress Notes (Signed)
PROGRESS NOTE    Krystal [name]  JOA:[id number] DOB: [birth year] DOA: 04/25/2018 PCP: Iona Beard, MD   Brief Narrative: Krystal [name] is a 82 y.o. female with a history of stage IV chronic kidney disease, insulin-dependent diabetes, GERD, hypertension, coronary artery disease, chronic diastolic heart failure, hyperlipidemia.  Patient recently hospitalized last week due to syncopal episode.  She was admitted on 04/19/2018 and was discharged on 6/3.  During that hospitalization she was treated for rhabdomyolysis, and the syncopal episode.  Her findings were nonspecific.  She returns today complaining of 2 months of dizziness, described as room spinning sensation with intermittent nausea. She was admitted for evaluation of stroke.     Assessment & Plan:   Active Problems:   DIASTOLIC DYSFUNCTION, normal LVF   GERD   CKD (chronic kidney disease) stage 3, GFR 30-59 ml/min (HCC)   Essential hypertension   Chronic respiratory failure with hypoxia (HCC)   COPD with chronic bronchitis (HCC)   Type 2 diabetes mellitus with stage 4 chronic kidney disease, with long-term current use of insulin (HCC)   Ischemic stroke ([place])   Left temporal lobe ischemic stroke:  Admitted to telemetry for further evaluation.  MRI brain and MRA head and neck showed  Small linear acute lacunar infarct in the white matter of the posterior left temporal lobe. No associated hemorrhage or mass effect. Small subacute appearing white matter lacune in the contralateral right hemisphere.  Confluent bilateral cerebral white matter T2 and FLAIR hyperintensity is nonspecific but most commonly due to chronic small vessel disease. Normal for age intracranial MRA.  Echocardiogram and carotid duplex ordered and pending.  hgba1c is 10.9 LDL is 81.  Restarted the xarelto. Will probably switch to eliquis if her creatinine continues to worsen vs coumadin.  Continues with aspirin 81 mg daily. Continue with zetia and crestor.      Type 2 DM uncontrolled With episodes of hypoglycemia:  On 70/30 novolog mix CBG (last 3)  Recent Labs    04/26/18 0809 04/26/18 1121 04/26/18 1143  GLUCAP 177* 37* 165*    Decreased the dose of am dose of novolog.    Hyperlipidemia:  Resume crestor and zetia.    Stage 3 to 4 CKD: Creatinine at baseline around 2.   Chronic diastolic heart failure: Compensated.    COPD: No wheezing heard.  Resume dulera.    Recent treated rhabdomyolysis.         DVT prophylaxis: xarelto Code Status: DNR Family Communication: none at bedside.  Disposition Plan: pending stroke work up.    Consultants:   Neurology.    Procedures:   MRI brain  MRA head and neck.   Echocardiogram  Carotid duplex.   Antimicrobials: None.   Subjective: Lethargic, unclear what the baseline is .   Objective: Vitals:   04/26/18 0755 04/26/18 1117 04/26/18 1140 04/26/18 1529  BP: (!) 159/69 (!) 228/79 (!) 136/48 (!) 151/50  Pulse: 81  89 86  Resp: 16  20 18   Temp: 98.7 F (37.1 C)  98.2 F (36.8 C) 98.3 F (36.8 C)  TempSrc: Oral  Oral Axillary  SpO2: 100%  100% 100%  Weight:      Height:        Intake/Output Summary (Last 24 hours) at 04/26/2018 1619 Last data filed at 04/26/2018 0848 Gross per 24 hour  Intake 530 ml  Output -  Net 530 ml   Filed Weights   04/25/18 1246 04/25/18 2112  Weight: 77.6 kg (171 lb) 76.9  kg (169 lb 8.5 oz)    Examination:  General exam: Appears calm , not in distress.  Respiratory system: Clear to auscultation. Respiratory effort normal. Cardiovascular system: S1 & S2 heard, RRR. No JVD,trace pedal edema. Gastrointestinal system: Abdomen is nondistended, soft and nontender. No organomegaly or masses felt. Normal bowel sounds heard. Central nervous system: lethargic, able to answer simple questions. Able to move arms  Extremities: trace edema. No cyanosis or clubbing.  Skin: No rashes, lesions or ulcers Psychiatry:  Flat affect.      Data Reviewed: I have personally reviewed following labs and imaging studies  CBC: Recent Labs  Lab 04/20/18 0637 04/25/18 1319  WBC 7.2 6.9  NEUTROABS  --  3.7  HGB 9.8* 10.0*  HCT 31.5* 31.9*  MCV 76.1* 76.3*  PLT 227 509   Basic Metabolic Panel: Recent Labs  Lab 04/20/18 0637 04/25/18 1319  NA 139 139  K 3.3* 4.1  CL 101 97*  CO2 31 34*  GLUCOSE 147* 282*  BUN 23* 25*  CREATININE 1.95* 2.28*  CALCIUM 8.8* 9.1   GFR: Estimated Creatinine Clearance: 21 mL/min (A) (by C-G formula based on SCr of 2.28 mg/dL (H)). Liver Function Tests: Recent Labs  Lab 04/25/18 1319  AST 26  ALT 20  ALKPHOS 53  BILITOT 0.7  PROT 6.4*  ALBUMIN 3.1*   No results for input(s): LIPASE, AMYLASE in the last 168 hours. No results for input(s): AMMONIA in the last 168 hours. Coagulation Profile: No results for input(s): INR, PROTIME in the last 168 hours. Cardiac Enzymes: Recent Labs  Lab 04/20/18 0637 04/25/18 1319  CKTOTAL 571*  --   TROPONINI  --  0.05*   BNP (last 3 results) No results for input(s): PROBNP in the last 8760 hours. HbA1C: Recent Labs    04/26/18 0537  HGBA1C 10.9*   CBG: Recent Labs  Lab 04/25/18 2159 04/26/18 0625 04/26/18 0809 04/26/18 1121 04/26/18 1143  GLUCAP 215* 214* 177* 37* 165*   Lipid Profile: Recent Labs    04/26/18 0537  CHOL 132  HDL 30*  LDLCALC 81  TRIG 105  CHOLHDL 4.4   Thyroid Function Tests: No results for input(s): TSH, T4TOTAL, FREET4, T3FREE, THYROIDAB in the last 72 hours. Anemia Panel: No results for input(s): VITAMINB12, FOLATE, FERRITIN, TIBC, IRON, RETICCTPCT in the last 72 hours. Sepsis Labs: No results for input(s): PROCALCITON, LATICACIDVEN in the last 168 hours.  Recent Results (from the past 240 hour(s))  Urine culture     Status: None   Collection Time: 04/19/18  8:01 AM  Result Value Ref Range Status   Specimen Description   Final    URINE, CATHETERIZED Performed at [facility],  [address], [place], [place] [address]    Special Requests   Final    NONE Performed at [facility], [address] [place], [place] [address]    Culture   Final    NO GROWTH Performed at [facility], [place] [address], [place], [place] [address]    Report Status 04/20/2018 FINAL  Final         Radiology Studies: Dg Chest 2 View  Result Date: 04/25/2018 CLINICAL DATA:  Shortness of breath. EXAM: CHEST - 2 VIEW COMPARISON:  Radiographs of April 19, 2018. FINDINGS: The heart size and mediastinal contours are within normal limits. Both lungs are clear. No pneumothorax or pleural effusion is noted. The visualized skeletal structures are unremarkable. IMPRESSION: No active cardiopulmonary disease. Electronically Signed   By:  Marijo Conception, M.D.   On: 04/25/2018 14:45   Mr [name] Head Wo Contrast  Result Date: 04/25/2018 CLINICAL DATA:  82 year old female with ataxia and weakness. EXAM: MRI HEAD WITHOUT CONTRAST MRA HEAD WITHOUT CONTRAST TECHNIQUE: Multiplanar, multiecho pulse sequences of the brain and surrounding structures were obtained without intravenous contrast. Angiographic images of the head were obtained using MRA technique without contrast. COMPARISON:  Head CTs without contrast 04/19/2018 and earlier. FINDINGS: MRI HEAD FINDINGS Brain: There is a linear focus of restricted diffusion in the left hemisphere periatrial white matter tracking to the subcortical white matter of the posterior left temporal lobe (series 5, image 45 and series 6, image 11). No associated hemorrhage or mass effect. There is a small round 4 millimeter focus of abnormal diffusion in the contralateral right posterior hemisphere white matter (series 3, image 73) which is not as clearly restricted on ADC. No other abnormal diffusion, but widespread and confluent bilateral cerebral white matter T2 and FLAIR hyperintensity. No cortical encephalomalacia or chronic cerebral blood products. The deep gray matter nuclei,  brainstem, and cerebellum appear normal for age. Vascular: Major intracranial vascular flow voids are preserved. Skull and upper cervical spine: Negative visible cervical spine. Visualized bone marrow signal is within normal limits. Sinuses/Orbits: Orbit motion artifact, orbits soft tissues appear within normal limits. Paranasal sinuses and mastoids are stable and well pneumatized. Other: Grossly normal visible internal auditory structures. Scalp and face soft tissues appear negative. MRA HEAD FINDINGS Antegrade flow in the posterior circulation with codominant distal vertebral arteries. Patent PICA origins and vertebrobasilar junction. Patent basilar artery without stenosis. Normal SCA and PCA origins. Posterior communicating arteries are diminutive or absent. Normal bilateral PCA branches. Antegrade flow in both ICA siphons. No siphon stenosis. Patent carotid termini. Normal MCA and ACA origins. Diminutive or absent anterior communicating artery. Visible ACA branches are within normal limits. MCA M1 segments, MCA bi/trifurcations, and visible MCA branches are within normal limits. IMPRESSION: 1. Small linear acute lacunar infarct in the white matter of the posterior left temporal lobe. No associated hemorrhage or mass effect. 2. Small subacute appearing white matter lacune in the contralateral right hemisphere. Burtis Junes this reflects synchronous small vessel disease (see #3). 3. Confluent bilateral cerebral white matter T2 and FLAIR hyperintensity is nonspecific but most commonly due to chronic small vessel disease. 4. Normal for age intracranial MRA. Electronically Signed   By: Genevie Ann M.D.   On: 04/25/2018 14:43   Mr Brain Wo Contrast  Result Date: 04/25/2018 CLINICAL DATA:  82 year old female with ataxia and weakness. EXAM: MRI HEAD WITHOUT CONTRAST MRA HEAD WITHOUT CONTRAST TECHNIQUE: Multiplanar, multiecho pulse sequences of the brain and surrounding structures were obtained without intravenous contrast.  Angiographic images of the head were obtained using MRA technique without contrast. COMPARISON:  Head CTs without contrast 04/19/2018 and earlier. FINDINGS: MRI HEAD FINDINGS Brain: There is a linear focus of restricted diffusion in the left hemisphere periatrial white matter tracking to the subcortical white matter of the posterior left temporal lobe (series 5, image 45 and series 6, image 11). No associated hemorrhage or mass effect. There is a small round 4 millimeter focus of abnormal diffusion in the contralateral right posterior hemisphere white matter (series 3, image 73) which is not as clearly restricted on ADC. No other abnormal diffusion, but widespread and confluent bilateral cerebral white matter T2 and FLAIR hyperintensity. No cortical encephalomalacia or chronic cerebral blood products. The deep gray matter nuclei, brainstem, and cerebellum appear normal for age. Vascular:  Major intracranial vascular flow voids are preserved. Skull and upper cervical spine: Negative visible cervical spine. Visualized bone marrow signal is within normal limits. Sinuses/Orbits: Orbit motion artifact, orbits soft tissues appear within normal limits. Paranasal sinuses and mastoids are stable and well pneumatized. Other: Grossly normal visible internal auditory structures. Scalp and face soft tissues appear negative. MRA HEAD FINDINGS Antegrade flow in the posterior circulation with codominant distal vertebral arteries. Patent PICA origins and vertebrobasilar junction. Patent basilar artery without stenosis. Normal SCA and PCA origins. Posterior communicating arteries are diminutive or absent. Normal bilateral PCA branches. Antegrade flow in both ICA siphons. No siphon stenosis. Patent carotid termini. Normal MCA and ACA origins. Diminutive or absent anterior communicating artery. Visible ACA branches are within normal limits. MCA M1 segments, MCA bi/trifurcations, and visible MCA branches are within normal limits.  IMPRESSION: 1. Small linear acute lacunar infarct in the white matter of the posterior left temporal lobe. No associated hemorrhage or mass effect. 2. Small subacute appearing white matter lacune in the contralateral right hemisphere. Burtis Junes this reflects synchronous small vessel disease (see #3). 3. Confluent bilateral cerebral white matter T2 and FLAIR hyperintensity is nonspecific but most commonly due to chronic small vessel disease. 4. Normal for age intracranial MRA. Electronically Signed   By: Genevie Ann M.D.   On: 04/25/2018 14:43        Scheduled Meds: . aspirin EC  81 mg Oral q morning - 10a  . calcitRIOL  0.25 mcg Oral Daily  . enoxaparin (LOVENOX) injection  30 mg Subcutaneous Q24H  . ezetimibe  10 mg Oral q morning - 10a  . famotidine  20 mg Oral Daily  . insulin aspart  0-15 Units Subcutaneous TID WC  . insulin aspart  0-5 Units Subcutaneous QHS  . insulin aspart protamine- aspart  70 Units Subcutaneous Q breakfast   And  . insulin aspart protamine- aspart  40 Units Subcutaneous Q supper  . loratadine  10 mg Oral Daily  . mometasone-formoterol  2 puff Inhalation BID  . ranolazine  500 mg Oral BID  . rosuvastatin  20 mg Oral QPM   Continuous Infusions:   LOS: 1 day    Time spent: 35 minutes.     Hosie Poisson, MD [facility] Pager [phone]  If 7PM-7AM, please contact night-coverage [email or web address] Password [facility] 04/26/2018, 4:19 PM

## 2018-04-26 NOTE — Progress Notes (Deleted)
Referring Physician: Hosie Poisson, MD    Chief Complaint: Weakness   HPI: Krystal [name] is an 82 y.o. female with a past medical history of stage IV chronic kidney disease, uncontrolled diabetes mellitus-insulin-dependent, GERD, coronary artery disease on low-dose Xarelto, hypertension, chronic diastolic heart failure, dyslipidemia as well as a recent hospitalization, with discharge 04/21/18 with syncope and rhabdomyolysis.  Patient came to the ED with complaints of 2 months of dizziness which she describes as the room spinning with intermittent nausea as well as right lower extremity weakness.  Patient on assessment is very lethargic, she status post a blood glucose 37 in the last 30 minutes and minimally answers questions.  HPI and review of systems obtained from chart.  On admission, patient's creatinine was at baseline, chest x-ray was within normal limits, MRI of the brain showed a small linear acute lacunar infarct in the left temporal lobe as well as a subacute lacunar infarct in the right hemisphere as well as a white matter disease due to chronic small vessel disease.  Will reassess patient later today when she is more alert.  LSN: 04/25/18 tPA Given: No: NO LVO NIHSS:   Baseline Premorbid modified Rankin scale (mRS): 1  Past Medical History:  Diagnosis Date  . Allergic rhinitis   . CAD (coronary artery disease) 2009   5 stents- #3 in RCA, #1 each in LAD and AVG  . CHF (congestive heart failure) ([place])   . CKD (chronic kidney disease) stage 3, GFR 30-59 ml/min (HCC)   . Complete heart block, transient 2014  . Diastolic dysfunction, left ventricle   . DJD (degenerative joint disease)   . Emphysema lung (HCC)    2L N/C continuously  . Essential hypertension   . GERD (gastroesophageal reflux disease)   . Glaucoma   . Gout   . History of uterine cancer   . HOH (hard of hearing)   . Hyperlipidemia   . MI (myocardial infarction) ([place]) 1998  . Microcytic anemia    History of  occult blood in stool  . Myalgia   . Osteoarthritis   . Palpitations   . Personal history of colonic polyps   . Sleep apnea    CPAP  . Type II diabetes mellitus with nephropathy [facility])     Past Surgical History:  Procedure Laterality Date  . ABDOMINAL HYSTERECTOMY  04/2010   Uterine cancer,TAHBSO  . CERVICAL DISCECTOMY     L5 left/hemilminectomy  . COLONOSCOPY  09/2006   Int hemmorhoids, COMPLICATED BY CARDIOPULMONARY COMPLICATIONS  . CORONARY ANGIOPLASTY  1/99, 1/07, 1/08, 4/09   5 cardiac stents total  . ECTOPIC PREGNANCY SURGERY    . FOOT SURGERY     Left and right for callous  . LEFT AND RIGHT HEART CATHETERIZATION WITH CORONARY ANGIOGRAM N/A 09/12/2012   Procedure: LEFT AND RIGHT HEART CATHETERIZATION WITH CORONARY ANGIOGRAM;  Surgeon: Sanda Klein, MD;  Location: [facility] CATH [facility];  Service: Cardiovascular;  Laterality: N/A;  . POLYPECTOMY  10/22/2011   Internal hemorrhoids/sessile polyp    Family History  Problem Relation Age of Onset  . Anesthesia problems Neg Hx   . Hypotension Neg Hx   . Malignant hyperthermia Neg Hx   . Pseudochol deficiency Neg Hx    Social History:  reports that she quit smoking about 11 years ago. Her smoking use included cigarettes. She started smoking about 63 years ago. She has a 20.40 pack-year smoking history. She has never used smokeless tobacco. She reports that she does not drink  alcohol or use drugs.  Allergies:  Allergies  Allergen Reactions  . Morphine Shortness Of Breath and Swelling  . Penicillins Shortness Of Breath and Swelling    Has patient had a PCN reaction causing immediate rash, facial/tongue/throat swelling, SOB or lightheadedness with hypotension: No Has patient had a PCN reaction causing severe rash involving mucus membranes or skin necrosis: No Has patient had a PCN reaction that required hospitalization Yes Has patient had a PCN reaction occurring within the last 10 years: No If all of the above answers are "NO", then  may proceed with Cephalosporin use.   . Shellfish Allergy     Medications:  . aspirin EC  81 mg Oral q morning - 10a  . calcitRIOL  0.25 mcg Oral Daily  . enoxaparin (LOVENOX) injection  30 mg Subcutaneous Q24H  . ezetimibe  10 mg Oral q morning - 10a  . famotidine  20 mg Oral Daily  . insulin aspart  0-15 Units Subcutaneous TID WC  . insulin aspart  0-5 Units Subcutaneous QHS  . insulin aspart protamine- aspart  70 Units Subcutaneous Q breakfast   And  . insulin aspart protamine- aspart  40 Units Subcutaneous Q supper  . loratadine  10 mg Oral Daily  . mometasone-formoterol  2 puff Inhalation BID  . ranolazine  500 mg Oral BID  . rosuvastatin  20 mg Oral QPM    ROS: Unable to accurately obtain due to patient being very somnolent and lethargic  Physical Examination: Blood pressure (!) 136/48, pulse 89, temperature 98.2 F (36.8 C), temperature source Oral, resp. rate 20, height 5\' 7"  (1.702 m), weight 76.9 kg (169 lb 8.5 oz), SpO2 100 %. HEENT-  Normocephalic, no lesions, without obvious abnormality.  Normal external eye and conjunctiva.   Cardiovascular- S1-S2 audible, pulses palpable throughout   Lungs-no rhonchi or wheezing noted, no excessive working breathing.  Saturations within normal limits Abdomen- All 4 quadrants palpated and nontender Musculoskeletal-no joint tenderness, deformity or swelling Skin-warm and dry,   Neurological Examination Mental Status: Patient is currently lethargic recently had a blood glucose at 37 in the last 30 minutes.  Patient is easily arousable though and minimally follows commands and goes back to sleep.  With very quite speech she is able to articulate the month, year, place and hospital.   Cranial Nerves: II: Visual fields grossly normal, III,IV, VI: ptosis not present, pupils small,equal, round, reactive to light bilaterally, unable to accurately assess extraocular motions as patient falls back asleep V,VII: No facial asymmetry noted,   VIII: Hearing intact to voice XI: bilateral shoulder shrug XII: midline tongue extension Motor: With generalized weakness and effort dependent, but did note mild weakness in right lower extremity.  Patient minimally able to raise arm with elbow strength of the bed, unable to raise bilateral lower extremities of the bed Tone and bulk:normal tone throughout; no atrophy noted Sensory: Patient minimally  winces to sensation bilaterally, no withdrawal Deep Tendon Reflexes: 2+ and symmetric throughout Plantars: Right: downgoing   Left: downgoing Cerebellar: Unable to accurately assess patient is very somnolent and minimally following commands Gait: Not assessed due to safety  Imaging Dg Chest 2 View 04/25/2018 IMPRESSION:  No active cardiopulmonary disease.   MRI Brain/ Mra Head Wo Contrast 04/25/2018 IMPRESSION:  1. Small linear acute lacunar infarct in the white matter of the posterior left temporal lobe. No associated hemorrhage or mass effect.  2. Small subacute appearing white matter lacune in the contralateral right hemisphere. Burtis Junes this reflects synchronous  small vessel disease (see #3).  3. Confluent bilateral cerebral white matter T2 and FLAIR hyperintensity is nonspecific but most commonly due to chronic small vessel disease.  4. Normal for age intracranial MRA.    Assessment: 82 y.o. female ith a past medical history of stage IV chronic kidney disease, uncontrolled diabetes mellitus-insulin-dependent, GERD, coronary artery disease on low-dose Xarelto, hypertension, chronic diastolic heart failure, dyslipidemia as well as a recent hospitalization, with discharge 04/21/18 with syncope and rhabdomyolysis.  Patient comes into the ED with complaints of 2 months of dizziness as well as right lower extremity weakness of unknown start date. 1.  Acute ischemic stroke, MRI showed small vessel disease as well as left temporal lobe lacunar infarct as well right small subacute infarct.  Patient had a  recent CT of the head on 1 June which was within normal limits.  At baseline patient takes a low-dose Xarelto 2.5 mg twice daily due to renal function, and patient did stroke while on anticoagulation.  Certainly patient has high risk for stroke with a history of uncontrolled diabetes hemoglobin A1c 10.9 and LDL 81.  Will complete further stroke work-up with echocardiogram as well as carotid Dopplers.  Will maintain permissive hypertension for the next 24 hours and maintain normotensive blood pressure.  Will order early rehabilitation with physical therapy, patient therapy and speech therapy. Stroke Risk Factors - diabetes mellitus, hyperlipidemia and hypertension 2.  Uncontrolled diabetes mellitus, appears labile 3.  Hypertension 4.  Dyslipidemia 5. CAD/ Chronic Diastolic CHF 6. CKD IV   Plan: - Aspirin 91 mg daily - Continue PT consult, OT consult, Speech consult - Echocardiogram pending - Carotid dopplers pending - High dose Statin- Crestor 20mg  - Allow for permissive hypertension for the first 24-48h - only treat PRN if SBP >220 mmHg. Blood pressures can be gradually normalized to SBP<140 upon discharge - Risk factor modification - Telemetry monitoring -  Frequent neuro checks -Tight glycemic control without hypoglycemia episode -Continue cardioprotective medications -Avoid nephrotoxic drugs  Letha Cape DNP Neuro-hospitalist Team [phone] 04/26/2018, 12:11 PM

## 2018-04-26 NOTE — Progress Notes (Signed)
ANTICOAGULATION CONSULT NOTE - Initial Consult  Pharmacy Consult for Xarelto Indication: stroke  Allergies  Allergen Reactions  . Morphine Shortness Of Breath and Swelling  . Penicillins Shortness Of Breath and Swelling    Has patient had a PCN reaction causing immediate rash, facial/tongue/throat swelling, SOB or lightheadedness with hypotension: No Has patient had a PCN reaction causing severe rash involving mucus membranes or skin necrosis: No Has patient had a PCN reaction that required hospitalization Yes Has patient had a PCN reaction occurring within the last 10 years: No If all of the above answers are "NO", then may proceed with Cephalosporin use.   Marland Kitchen Shellfish Allergy     Patient Measurements: Height: 5\' 7"  (170.2 cm) Weight: 169 lb 8.5 oz (76.9 kg) IBW/kg (Calculated) : 61.6  Vital Signs: Temp: 98.3 F (36.8 C) (06/08 1529) Temp Source: Axillary (06/08 1529) BP: 151/50 (06/08 1529) Pulse Rate: 86 (06/08 1529)  Labs: Recent Labs    04/25/18 1319  HGB 10.0*  HCT 31.9*  PLT 225  CREATININE 2.28*  TROPONINI 0.05*    Estimated Creatinine Clearance: 21 mL/min (A) (by C-G formula based on SCr of 2.28 mg/dL (H)).   Medical History: Past Medical History:  Diagnosis Date  . Allergic rhinitis   . CAD (coronary artery disease) 2009   5 stents- #3 in RCA, #1 each in LAD and AVG  . CHF (congestive heart failure) ([place])   . CKD (chronic kidney disease) stage 3, GFR 30-59 ml/min (HCC)   . Complete heart block, transient 2014  . Diastolic dysfunction, left ventricle   . DJD (degenerative joint disease)   . Emphysema lung (HCC)    2L N/C continuously  . Essential hypertension   . GERD (gastroesophageal reflux disease)   . Glaucoma   . Gout   . History of uterine cancer   . HOH (hard of hearing)   . Hyperlipidemia   . MI (myocardial infarction) ([place]) 1998  . Microcytic anemia    History of occult blood in stool  . Myalgia   . Osteoarthritis   . Palpitations    . Personal history of colonic polyps   . Sleep apnea    CPAP  . Type II diabetes mellitus with nephropathy (HCC)     Medications:  Scheduled:  . aspirin EC  81 mg Oral q morning - 10a  . calcitRIOL  0.25 mcg Oral Daily  . ezetimibe  10 mg Oral q morning - 10a  . famotidine  20 mg Oral Daily  . insulin aspart  0-15 Units Subcutaneous TID WC  . insulin aspart  0-5 Units Subcutaneous QHS  . [START ON 04/27/2018] insulin aspart protamine- aspart  35 Units Subcutaneous Q breakfast   And  . insulin aspart protamine- aspart  40 Units Subcutaneous Q supper  . loratadine  10 mg Oral Daily  . mometasone-formoterol  2 puff Inhalation BID  . ranolazine  500 mg Oral BID  . rivaroxaban  2.5 mg Oral BID  . rosuvastatin  20 mg Oral QPM    Assessment: 21 yof on Xarelto low dose prior to admission with known stage IV CKD. Found to have small linear acute lacunar infarct in L temporal lobe + lacunar infarct in R hemisphere. Okay per neurology to resume home Xarelto.  Scr 2.28 (baseline ~2; CrCl ~21 mL/min) - team aware of CrCl. Hgb 10, plt 225. No s/sx of bleeding.   Goal of Therapy:  Monitor platelets by anticoagulation protocol: Yes   Plan:  Resume home Xarelto 2.5 mg twice daily Monitor renal function, CBC, and s/sx of bleeding Follow up if plan to transition to warfarin given SCr  Doylene Canard, PharmD Clinical Pharmacist  Pager: [phone] Phone: [phone] 04/26/2018,4:54 PM

## 2018-04-26 NOTE — Progress Notes (Signed)
  Echocardiogram 2D Echocardiogram has been performed.  Merrie Roof F 04/26/2018, 4:12 PM

## 2018-04-27 ENCOUNTER — Encounter (HOSPITAL_COMMUNITY): Payer: Medicare Other

## 2018-04-27 LAB — GLUCOSE, CAPILLARY
GLUCOSE-CAPILLARY: 94 mg/dL (ref 65–99)
GLUCOSE-CAPILLARY: 114 mg/dL — AB (ref 65–99)
GLUCOSE-CAPILLARY: 135 mg/dL — AB (ref 65–99)
Glucose-Capillary: 158 mg/dL — ABNORMAL HIGH (ref 65–99)

## 2018-04-27 LAB — BASIC METABOLIC PANEL
Anion gap: 11 (ref 5–15)
BUN: 26 mg/dL — AB (ref 6–20)
CHLORIDE: 95 mmol/L — AB (ref 101–111)
CO2: 29 mmol/L (ref 22–32)
CREATININE: 2.8 mg/dL — AB (ref 0.44–1.00)
Calcium: 9.1 mg/dL (ref 8.9–10.3)
GFR calc Af Amer: 17 mL/min — ABNORMAL LOW (ref >60)
GFR calc non Af Amer: 15 mL/min — ABNORMAL LOW (ref >60)
Glucose, Bld: 204 mg/dL — ABNORMAL HIGH (ref 65–99)
Potassium: 4 mmol/L (ref 3.5–5.1)
SODIUM: 135 mmol/L (ref 135–145)

## 2018-04-27 LAB — CK: CK TOTAL: 477 U/L — AB (ref 38–234)

## 2018-04-27 MED ORDER — RIVAROXABAN 15 MG PO TABS
15.0000 mg | ORAL_TABLET | Freq: Every day | ORAL | Status: DC
  Administered 2018-04-27: 15 mg via ORAL
  Filled 2018-04-27: qty 1

## 2018-04-27 NOTE — NC FL2 (Signed)
[place] LEVEL OF CARE SCREENING TOOL     IDENTIFICATION  Patient Name: Krystal [name] Birthdate: Mar 24, 1936 Sex: female Admission Date (Current Location): 04/25/2018  [facility] and [place] Number:  [employer] and Address:  [facility] [place], [place] [address] [place], [place] [address]      Provider Number: [id number]  Attending Physician Name and Address:  Hosie Poisson, MD  Relative Name and Phone Number:       Current Level of Care: Hospital Recommended Level of Care: [place] Prior Approval Number:    Date Approved/Denied:   PASRR Number: [id number]  Discharge Plan: SNF    Current Diagnoses: Patient Active Problem List   Diagnosis Date Noted  . Ischemic stroke ([place]) 04/25/2018  . Orthostasis 04/19/2018  . Rhabdomyolysis 04/19/2018  . Hypertensive crisis 11/08/2015  . Anemia, unspecified 11/08/2015  . Syncope 08/19/2015  . Chest pain 08/19/2015  . Anemia 06/08/2015  . Hemorrhoids with complication 28/41/3244  . Essential hypertension 04/15/2015  . Chronic respiratory failure with hypoxia ([place]) 04/15/2015  . COPD with chronic bronchitis ([place]) 04/15/2015  . Near syncope 04/15/2015  . Type 2 diabetes mellitus with stage 4 chronic kidney disease, with long-term current use of insulin ([place]) 04/15/2015  . Dizziness and giddiness 04/14/2015  . Dizziness 04/14/2015  . Abnormal involuntary movements 04/12/2015  . Postmenopausal vaginal bleeding 01/18/2014  . Leg edema 08/15/2013  . Complete heart block, transiet 01/26/2013  . CKD (chronic kidney disease) stage 3, GFR 30-59 ml/min (HCC) 09/18/2012  . OBESITY 05/20/2009  . PERIPHERAL NEUROPATHY 05/20/2009  . NEVUS, ATYPICAL 05/06/2009  . HYPERTENSION, PULMONARY - partially due to LV diastolic dysfunction 11/21/7251  . BRONCHITIS, CHRONIC 12/16/2008  . DIASTOLIC DYSFUNCTION, normal LVF 04/30/2008  . Obstructive sleep apnea 04/30/2008  . Deficiency anemia  09/23/2007  . [place] DISEASE, LUMBOSACRAL SPINE 04/08/2007  . Mixed hyperlipidemia 10/29/2006  . Coronary atherosclerosis 10/29/2006  . ALLERGIC RHINITIS 10/29/2006  . GERD 10/29/2006  . CONSTIPATION, OTHER 10/29/2006  . OSTEOARTHRITIS 10/29/2006  . COLONIC POLYPS, HX OF 10/29/2006    Orientation RESPIRATION BLADDER Height & Weight     Self, Time, Situation, Place  O2([place] 2L) Continent Weight: 169 lb 8.5 oz (76.9 kg) Height:  5\' 7"  (170.2 cm)  BEHAVIORAL SYMPTOMS/MOOD NEUROLOGICAL BOWEL NUTRITION STATUS      Continent Diet(heart healthy, carb modified)  AMBULATORY STATUS COMMUNICATION OF NEEDS Skin   Limited Assist Verbally Normal                       Personal Care Assistance Level of Assistance  Bathing, Feeding, Dressing Bathing Assistance: Limited assistance Feeding assistance: Independent Dressing Assistance: Limited assistance     Functional Limitations Info  Sight, Hearing, Speech Sight Info: Adequate Hearing Info: Adequate Speech Info: Adequate    SPECIAL CARE FACTORS FREQUENCY  PT (By licensed PT), OT (By licensed OT)     PT Frequency: 5x/wk OT Frequency: 5x/wk            Contractures Contractures Info: Not present    Additional Factors Info  Code Status, Allergies, Insulin Sliding Scale Code Status Info: DNR Allergies Info: Morphine, Penicillins, Shellfish Allergy   Insulin Sliding Scale Info: 0-15 units 3x/day with meals; 0-5 units daily at bed       Current Medications (04/27/2018):  This is the current hospital active medication list Current Facility-Administered Medications  Medication Dose Route Frequency Provider Last Rate Last Dose  . acetaminophen (  TYLENOL) tablet 650 mg  650 mg Oral Q4H PRN Truett Mainland, DO       Or  . acetaminophen (TYLENOL) solution 650 mg  650 mg Per Tube Q4H PRN Truett Mainland, DO       Or  . acetaminophen (TYLENOL) suppository 650 mg  650 mg Rectal Q4H PRN Truett Mainland, DO      . aspirin  EC tablet 81 mg  81 mg Oral q morning - 10a Truett Mainland, DO   81 mg at 04/27/18 6213  . calcitRIOL (ROCALTROL) capsule 0.25 mcg  0.25 mcg Oral Daily Truett Mainland, DO   0.25 mcg at 04/27/18 0865  . ezetimibe (ZETIA) tablet 10 mg  10 mg Oral q morning - 10a Truett Mainland, DO   10 mg at 04/27/18 7846  . famotidine (PEPCID) tablet 20 mg  20 mg Oral Daily Truett Mainland, DO   20 mg at 04/27/18 9629  . hydrALAZINE (APRESOLINE) injection 5 mg  5 mg Intravenous Q4H PRN Truett Mainland, DO   5 mg at 04/26/18 1117  . insulin aspart (novoLOG) injection 0-15 Units  0-15 Units Subcutaneous TID WC Truett Mainland, DO   2 Units at 04/26/18 [phone]  . insulin aspart (novoLOG) injection 0-5 Units  0-5 Units Subcutaneous QHS Truett Mainland, DO   2 Units at 04/25/18 2304  . insulin aspart protamine- aspart (NOVOLOG MIX 70/30) injection 35 Units  35 Units Subcutaneous Q breakfast Hosie Poisson, MD   35 Units at 04/27/18 0919   And  . insulin aspart protamine- aspart (NOVOLOG MIX 70/30) injection 40 Units  40 Units Subcutaneous Q supper Hosie Poisson, MD      . loratadine (CLARITIN) tablet 10 mg  10 mg Oral Daily Truett Mainland, DO   10 mg at 04/27/18 1324  . meclizine (ANTIVERT) tablet 25 mg  25 mg Oral QID PRN Truett Mainland, DO      . mometasone-formoterol (DULERA) 200-5 MCG/ACT inhaler 2 puff  2 puff Inhalation BID Truett Mainland, DO   2 puff at 04/27/18 4010  . polyethylene glycol (MIRALAX / GLYCOLAX) packet 17 g  17 g Oral Daily PRN Truett Mainland, DO   17 g at 04/26/18 1754  . ranolazine (RANEXA) 12 hr tablet 500 mg  500 mg Oral BID Truett Mainland, DO   500 mg at 04/27/18 2725  . rivaroxaban (XARELTO) tablet 2.5 mg  2.5 mg Oral BID Hosie Poisson, MD   2.5 mg at 04/27/18 0918  . rosuvastatin (CRESTOR) tablet 20 mg  20 mg Oral QPM Truett Mainland, DO   20 mg at 04/26/18 1754  . senna-docusate (Senokot-S) tablet 1 tablet  1 tablet Oral QHS PRN Truett Mainland, DO   1 tablet at 04/26/18  1754     Discharge Medications: Please see discharge summary for a list of discharge medications.  Relevant Imaging Results:  Relevant Lab Results:   Additional Information SS#: [id number]  Geralynn Ochs, LCSW

## 2018-04-27 NOTE — Clinical Social Work Note (Signed)
Clinical Social Work Assessment  Patient Details  Name: Krystal [name] MRN: [id number] Date of Birth: [birth year]  Date of referral:  04/27/18               Reason for consult:  Facility Placement                Permission sought to share information with:  Facility Sport and exercise psychologist, Family Supports Permission granted to share information::  Yes, Verbal Permission Granted  Name::     Krystal [name]  Agency::  SNF  Relationship::  Probation officer Information:     Housing/Transportation Living arrangements for the past 2 months:  [place] of Information:  Patient Patient Interpreter Needed:  None Criminal Activity/Legal Involvement Pertinent to Current Situation/Hospitalization:  No - Comment as needed Significant Relationships:  Other Family Members Lives with:  Self Do you feel safe going back to the place where you live?  Yes Need for family participation in patient care:  No (Coment)  Care giving concerns:  Patient from home alone and will benefit from SNF at discharge.   Social Worker assessment / plan:  CSW met with patient to discuss recommendation for SNF. CSW discussed facility options near patient's home and received permission to send out referral. CSW to follow with bed offers.  Employment status:  Retired Forensic scientist:  Medicare PT Recommendations:  [place] / Referral to community resources:  [place]  Patient/Family's Response to care:  Patient agreeable to SNF placement.  Patient/Family's Understanding of and Emotional Response to Diagnosis, Current Treatment, and Prognosis:  Patient acknowledged that she knows that she needs some additional therapy prior to going home, but that she didn't know anything about SNF or options available.  Emotional Assessment Appearance:  Appears stated age Attitude/Demeanor/Rapport:  Engaged Affect (typically observed):  Pleasant Orientation:  Oriented to  Self, Oriented to Place, Oriented to  Time, Oriented to Situation Alcohol / Substance use:  Not Applicable Psych involvement (Current and /or in the community):  No (Comment)  Discharge Needs  Concerns to be addressed:  Care Coordination Readmission within the last 30 days:  No Current discharge risk:  Dependent with Mobility, Lives alone Barriers to Discharge:  Continued Medical Work up, [place], [place] 04/27/2018, 1:05 PM

## 2018-04-27 NOTE — Progress Notes (Signed)
STROKE TEAM PROGRESS NOTE   HISTORY OF PRESENT ILLNESS (per record) Krystal [name] is an 82 y.o. female with a past medical history of stage IV chronic kidney disease, uncontrolled diabetes mellitus-insulin-dependent, GERD, coronary artery disease on low-dose Xarelto, hypertension, chronic diastolic heart failure, dyslipidemia as well as a recent hospitalization, with discharge 04/21/18 with syncope and rhabdomyolysis.  Patient came to the ED with complaints of 2 months of dizziness which she describes as the room spinning with intermittent nausea as well as right lower extremity weakness.  Patient on assessment is very lethargic, she status post a blood glucose 37 in the last 30 minutes and minimally answers questions.  HPI and review of systems obtained from chart.  On admission, patient's creatinine was at baseline, chest x-ray was within normal limits, MRI of the brain showed a small linear acute lacunar infarct in the left temporal lobe as well as a subacute lacunar infarct in the right hemisphere as well as a white matter lesions due to chronic small vessel disease.  LSN: 04/25/18 tPA Given: No: NO LVO  Baseline Premorbid modified Rankin scale (mRS):1    SUBJECTIVE (INTERVAL HISTORY) No family at bedside.  She is a very poor historian.  Records indicate that she was put on low dose Xarelto for coronary angina not responding to dual antiplatelet therapy.  She presents with vague symptoms of dizziness and headache.  MRI Brain shows acute infarcts in both parietal lobes, small in size.  There is moderate periventricular small vessel ischemic disease.  MRA Brain was negative.  TTE was normal.  LDL 81.  TG 105.  A1c 10.9.  CDUS pending.  Tele so far shows sinus arrhythmia.  GFR is 17.      OBJECTIVE Temp:  [98.2 F (36.8 C)-99.4 F (37.4 C)] 98.7 F (37.1 C) (06/09 0746) Pulse Rate:  [80-101] 80 (06/09 0829) Cardiac Rhythm: Heart block (06/09 0713) Resp:  [16-20] 16 (06/09 0829) BP:  (136-228)/(48-114) 181/114 (06/09 0746) SpO2:  [98 %-100 %] 98 % (06/09 0829) FiO2 (%):  [28 %] 28 % (06/09 0829)  CBC:  Recent Labs  Lab 04/25/18 1319  WBC 6.9  NEUTROABS 3.7  HGB 10.0*  HCT 31.9*  MCV 76.3*  PLT 614    Basic Metabolic Panel:  Recent Labs  Lab 04/25/18 1319  NA 139  K 4.1  CL 97*  CO2 34*  GLUCOSE 282*  BUN 25*  CREATININE 2.28*  CALCIUM 9.1    Lipid Panel:     Component Value Date/Time   CHOL 132 04/26/2018 0537   TRIG 105 04/26/2018 0537   HDL 30 (L) 04/26/2018 0537   CHOLHDL 4.4 04/26/2018 0537   VLDL 21 04/26/2018 0537   LDLCALC 81 04/26/2018 0537   HgbA1c:  Lab Results  Component Value Date   HGBA1C 10.9 (H) 04/26/2018   Urine Drug Screen: No results found for: LABOPIA, COCAINSCRNUR, LABBENZ, AMPHETMU, THCU, LABBARB  Alcohol Level No results found for: [facility]   IMAGING  Dg Chest 2 View 04/25/2018 IMPRESSION: No active cardiopulmonary disease.    Mr Krystal [name] Head Wo Contrast 04/25/2018 IMPRESSION:  1. Small linear acute lacunar infarct in the white matter of the posterior left temporal lobe. No associated hemorrhage or mass effect.  2. Small subacute appearing white matter lacune in the contralateral right hemisphere. Burtis Junes this reflects synchronous small vessel disease (see #3).  3. Confluent bilateral cerebral white matter T2 and FLAIR hyperintensity is nonspecific but most commonly due to chronic small vessel disease.  4. Normal for age intracranial MRA.      Transthoracic Echocardiogram  04/26/2018 Study Conclusions - Left ventricle: The cavity size was normal. There was moderate   concentric hypertrophy. Systolic function was normal. The   estimated ejection fraction was in the range of 60% to 65%. Wall   motion was normal; there were no regional wall motion   abnormalities. Doppler parameters are consistent with abnormal   left ventricular relaxation (grade 1 diastolic dysfunction).   Doppler parameters are consistent with  elevated mean left atrial   filling pressure.   Bilateral Carotid Dopplers - pending 00/00/00    PHYSICAL EXAM Vitals:   04/27/18 0002 04/27/18 0403 04/27/18 0746 04/27/18 0829  BP: (!) 160/78 (!) 175/66 (!) 181/114   Pulse: 99 96 92 80  Resp: 20 18 16 16   Temp: 98.6 F (37 C) 99 F (37.2 C) 98.7 F (37.1 C)   TempSrc: Oral Oral Oral   SpO2: 98% 99% 100% 98%  Weight:      Height:        Awake, alert, fully oriented.  Language- fluent.  Comprehension, naming, repetition - intact. Face symmetrical. Tongue midline. Strength 5/5 BUE and BLE.   Coord - FTN and HTS intact bil. Sensory - mild decrease in pinprick and touch bilaterally.   Gait - deferred.   ASSESSMENT/PLAN Krystal [name] is a 82 y.o. female with history of sleep apnea, hyperlipidemia, coronary artery disease with previous MI, hard of hearing, cancer history, hypertension, emphysema, diastolic dysfunction, transient complete heart block, chronic kidney disease, congestive heart failure, and diabetes mellitus presenting with dizziness, syncope, lethargy, and blood glucose of 37. She did not receive IV t-PA due to no LVO.  2 small embolic appearing ischemic infarcts in bilateral subcortical parietal areas.  Her Xarelto was stopped and changed to ASA on admission, but if these are cardioembolic strokes as suspected, then she had failure of low dose Xarelto and it is not likely that ASA would be more beneficial.  I will switch her back to Xarelto at a higher effective dose of 15 mg/day for cardioembolism protection but with renal dosing given low GFR.  Hypertension and diabetes are her most significant other uncontrolled risk factors.  Awaiting CDUS.  She will require TEE with possible Loop recorder as well unless atrial fib is detected on telemetry before then.    Rogue Jury, MS, MD    Stroke: Small acute lacunar infarct in the white matter of the posterior Lt temporal lobe. Small vessel.  Resultant   numbness  CT head - not performed  MRI head -  Small linear acute lacunar infarct in the white matter of the posterior left temporal lobe. Small subacute appearing white matter lacune in the contralateral right hemisphere.  MRA head - normal  Carotid Doppler - pending  2D Echo - EF 60% to 65%. No cardiac source of emboli identified.   LDL - 81  HgbA1c - 10.9  VTE prophylaxis - Xarelto Diet Order           Diet heart healthy/carb modified Room service appropriate? Yes; Fluid consistency: Thin  Diet effective now          aspirin 81 mg daily and Xarelto (rivaroxaban) daily prior to admission, now on aspirin 81 mg daily and Xarelto (rivaroxaban) daily  Patient counseled to be compliant with her antithrombotic medications  Ongoing aggressive stroke risk factor management  Therapy recommendations:  SNF recommended  Disposition:  Pending  Hypertension  Blood  pressure is running somewhat high . Permissive hypertension (OK if < 220/120) but gradually normalize in 5-7 days . Long-term BP goal normotensive  Hyperlipidemia  Lipid lowering medication PTA: Crestor 20 mg QD and Zetia 10 mg QD  LDL 81, goal < 70  Current lipid lowering medication: Crestor 20 mg QD and Zetia 10 mg QD  Continue statin at discharge  Diabetes  HgbA1c 10.9, goal < 7.0  Uncontrolled  Other Stroke Risk Factors  Advanced age  Former cigarette smoker - quit  Coronary artery disease  Obstructive sleep apnea - Cpap  Previous stroke by MRI  Other Active Problems  Mild anemia  CKD  CAD - Her cardiologist, Dr Bronson Ing started pt. on low dose Xarelto 2.5 mg BID on 01/02/2018 for CAD and stopped plavix.    Plan / Recommendations   Stroke workup: awaiting - carotid dopplers  Therapy Follow Up: SNF recommended  Disposition: pending  Antiplatelet / Anticoagulation:   Statin: Consider increasing Crestor or change to Lipitor  MD Follow Up: [facility] in 6-8  weeks  Other: pending  Further risk factor modification per primary care MD: Follow Up 2 weeks   Hospital day # 2    To contact Stroke Continuity provider, please refer to [email or web address] After hours, contact General Neurology

## 2018-04-27 NOTE — Progress Notes (Signed)
PROGRESS NOTE    Krystal [name]  WNI:[id number] DOB: 12/18/1935 DOA: 04/25/2018 PCP: Iona Beard, MD   Brief Narrative: Krystal [name] is a 82 y.o. female with a history of stage IV chronic kidney disease, insulin-dependent diabetes, GERD, hypertension, coronary artery disease, chronic diastolic heart failure, hyperlipidemia.  Patient recently hospitalized last week due to syncopal episode.  She was admitted on 04/19/2018 and was discharged on 6/3.  During that hospitalization she was treated for rhabdomyolysis, and the syncopal episode.  Her findings were nonspecific.  She returns today complaining of 2 months of dizziness, described as room spinning sensation with intermittent nausea. She was admitted for evaluation of stroke.     Assessment & Plan:   Active Problems:   DIASTOLIC DYSFUNCTION, normal LVF   GERD   CKD (chronic kidney disease) stage 3, GFR 30-59 ml/min (HCC)   Essential hypertension   Chronic respiratory failure with hypoxia (HCC)   COPD with chronic bronchitis (HCC)   Type 2 diabetes mellitus with stage 4 chronic kidney disease, with long-term current use of insulin (HCC)   Ischemic stroke ([place])   Left temporal lobe ischemic stroke:  Admitted to telemetry for further evaluation.  MRI brain and MRA head and neck showed  Small linear acute lacunar infarct in the white matter of the posterior left temporal lobe. No associated hemorrhage or mass effect. Small subacute appearing white matter lacune in the contralateral right hemisphere.  Confluent bilateral cerebral white matter T2 and FLAIR hyperintensity is nonspecific but most commonly due to chronic small vessel disease. Normal for age intracranial MRA.  Echocardiogram showed LVEF of 605 , wall motion normal and no regional wall abnormalities. Grade dysfunction present. No cardiac source of embolus found.  and carotid duplex ordered and pending.  hgba1c is 10.9 LDL is 81.  Restarted the xarelto. Probably switch  to coumadin by the end of the day depending on the renal parameters.  Continues with aspirin 81 mg daily. Continue with zetia and crestor.     Type 2 DM uncontrolled With episodes of hypoglycemia:  On 70/30 novolog mix CBG (last 3)  Recent Labs    04/26/18 2114 04/27/18 0616 04/27/18 1134  GLUCAP 111* 94 114*    Decreased the dose of am dose of novolog.  Better controlled.    Hyperlipidemia:  Resume crestor and zetia.    Stage 3 to 4 CKD: Creatinine at baseline around 2.   Chronic diastolic heart failure: Compensated.    COPD: No wheezing heard.  Resume dulera.    Recent treated rhabdomyolysis.  CK levels ordered.    Hypertension;  Permissive hypertension for another 48 hours.     DVT prophylaxis: xarelto Code Status: DNR Family Communication: none at bedside.  Disposition Plan: pending stroke work up.    Consultants:   Neurology.    Procedures:   MRI brain  MRA head and neck.   Echocardiogram  Carotid duplex.   Antimicrobials: None.   Subjective: Alert and oriented. Reports she is feeling better today.   Objective: Vitals:   04/27/18 0403 04/27/18 0746 04/27/18 0829 04/27/18 1137  BP: (!) 175/66 (!) 181/114  (!) 185/72  Pulse: 96 92 80 (!) 47  Resp: 18 16 16    Temp: 99 F (37.2 C) 98.7 F (37.1 C)  98.7 F (37.1 C)  TempSrc: Oral Oral  Oral  SpO2: 99% 100% 98% 100%  Weight:      Height:        Intake/Output Summary (Last 24 hours)  at 04/27/2018 1332 Last data filed at 04/26/2018 1729 Gross per 24 hour  Intake 240 ml  Output -  Net 240 ml   Filed Weights   04/25/18 1246 04/25/18 2112  Weight: 77.6 kg (171 lb) 76.9 kg (169 lb 8.5 oz)    Examination:  General exam: Appears calm , not in distress.  Respiratory system: Clear to auscultation. Respiratory effort normal. No wheezing or rhonchi.  Cardiovascular system: S1 & S2 heard, RRR. No JVD,trace pedal edema. Gastrointestinal system: Abdomen is soft NT ND BS+ Central  nervous system:alert and oriented. Non focal.  Extremities: trace edema. No cyanosis or clubbing.  Skin: No rashes, lesions or ulcers Psychiatry: mood and affect appropriate.     Data Reviewed: I have personally reviewed following labs and imaging studies  CBC: Recent Labs  Lab 04/25/18 1319  WBC 6.9  NEUTROABS 3.7  HGB 10.0*  HCT 31.9*  MCV 76.3*  PLT 836   Basic Metabolic Panel: Recent Labs  Lab 04/25/18 1319  NA 139  K 4.1  CL 97*  CO2 34*  GLUCOSE 282*  BUN 25*  CREATININE 2.28*  CALCIUM 9.1   GFR: Estimated Creatinine Clearance: 21 mL/min (A) (by C-G formula based on SCr of 2.28 mg/dL (H)). Liver Function Tests: Recent Labs  Lab 04/25/18 1319  AST 26  ALT 20  ALKPHOS 53  BILITOT 0.7  PROT 6.4*  ALBUMIN 3.1*   No results for input(s): LIPASE, AMYLASE in the last 168 hours. No results for input(s): AMMONIA in the last 168 hours. Coagulation Profile: No results for input(s): INR, PROTIME in the last 168 hours. Cardiac Enzymes: Recent Labs  Lab 04/25/18 1319  TROPONINI 0.05*   BNP (last 3 results) No results for input(s): PROBNP in the last 8760 hours. HbA1C: Recent Labs    04/26/18 0537  HGBA1C 10.9*   CBG: Recent Labs  Lab 04/26/18 1143 04/26/18 1704 04/26/18 2114 04/27/18 0616 04/27/18 1134  GLUCAP 165* 70 111* 94 114*   Lipid Profile: Recent Labs    04/26/18 0537  CHOL 132  HDL 30*  LDLCALC 81  TRIG 105  CHOLHDL 4.4   Thyroid Function Tests: No results for input(s): TSH, T4TOTAL, FREET4, T3FREE, THYROIDAB in the last 72 hours. Anemia Panel: No results for input(s): VITAMINB12, FOLATE, FERRITIN, TIBC, IRON, RETICCTPCT in the last 72 hours. Sepsis Labs: No results for input(s): PROCALCITON, LATICACIDVEN in the last 168 hours.  Recent Results (from the past 240 hour(s))  Urine culture     Status: None   Collection Time: 04/19/18  8:01 AM  Result Value Ref Range Status   Specimen Description   Final    URINE,  CATHETERIZED Performed at [facility], [address] [place], [place] [address]    Special Requests   Final    NONE Performed at [facility], [address], [place], [place] [address]    Culture   Final    NO GROWTH Performed at [facility], [place] [address], [place], [place] [address]    Report Status 04/20/2018 FINAL  Final         Radiology Studies: Dg Chest 2 View  Result Date: 04/25/2018 CLINICAL DATA:  Shortness of breath. EXAM: CHEST - 2 VIEW COMPARISON:  Radiographs of April 19, 2018. FINDINGS: The heart size and mediastinal contours are within normal limits. Both lungs are clear. No pneumothorax or pleural effusion is noted. The visualized skeletal structures are unremarkable. IMPRESSION: No active cardiopulmonary disease. Electronically Signed  By: Marijo Conception, M.D.   On: 04/25/2018 14:45   Mr [name] Head Wo Contrast  Result Date: 04/25/2018 CLINICAL DATA:  82 year old female with ataxia and weakness. EXAM: MRI HEAD WITHOUT CONTRAST MRA HEAD WITHOUT CONTRAST TECHNIQUE: Multiplanar, multiecho pulse sequences of the brain and surrounding structures were obtained without intravenous contrast. Angiographic images of the head were obtained using MRA technique without contrast. COMPARISON:  Head CTs without contrast 04/19/2018 and earlier. FINDINGS: MRI HEAD FINDINGS Brain: There is a linear focus of restricted diffusion in the left hemisphere periatrial white matter tracking to the subcortical white matter of the posterior left temporal lobe (series 5, image 45 and series 6, image 11). No associated hemorrhage or mass effect. There is a small round 4 millimeter focus of abnormal diffusion in the contralateral right posterior hemisphere white matter (series 3, image 73) which is not as clearly restricted on ADC. No other abnormal diffusion, but widespread and confluent bilateral cerebral white matter T2 and FLAIR hyperintensity. No cortical encephalomalacia or chronic cerebral  blood products. The deep gray matter nuclei, brainstem, and cerebellum appear normal for age. Vascular: Major intracranial vascular flow voids are preserved. Skull and upper cervical spine: Negative visible cervical spine. Visualized bone marrow signal is within normal limits. Sinuses/Orbits: Orbit motion artifact, orbits soft tissues appear within normal limits. Paranasal sinuses and mastoids are stable and well pneumatized. Other: Grossly normal visible internal auditory structures. Scalp and face soft tissues appear negative. MRA HEAD FINDINGS Antegrade flow in the posterior circulation with codominant distal vertebral arteries. Patent PICA origins and vertebrobasilar junction. Patent basilar artery without stenosis. Normal SCA and PCA origins. Posterior communicating arteries are diminutive or absent. Normal bilateral PCA branches. Antegrade flow in both ICA siphons. No siphon stenosis. Patent carotid termini. Normal MCA and ACA origins. Diminutive or absent anterior communicating artery. Visible ACA branches are within normal limits. MCA M1 segments, MCA bi/trifurcations, and visible MCA branches are within normal limits. IMPRESSION: 1. Small linear acute lacunar infarct in the white matter of the posterior left temporal lobe. No associated hemorrhage or mass effect. 2. Small subacute appearing white matter lacune in the contralateral right hemisphere. Burtis Junes this reflects synchronous small vessel disease (see #3). 3. Confluent bilateral cerebral white matter T2 and FLAIR hyperintensity is nonspecific but most commonly due to chronic small vessel disease. 4. Normal for age intracranial MRA. Electronically Signed   By: Genevie Ann M.D.   On: 04/25/2018 14:43   Mr Brain Wo Contrast  Result Date: 04/25/2018 CLINICAL DATA:  82 year old female with ataxia and weakness. EXAM: MRI HEAD WITHOUT CONTRAST MRA HEAD WITHOUT CONTRAST TECHNIQUE: Multiplanar, multiecho pulse sequences of the brain and surrounding structures  were obtained without intravenous contrast. Angiographic images of the head were obtained using MRA technique without contrast. COMPARISON:  Head CTs without contrast 04/19/2018 and earlier. FINDINGS: MRI HEAD FINDINGS Brain: There is a linear focus of restricted diffusion in the left hemisphere periatrial white matter tracking to the subcortical white matter of the posterior left temporal lobe (series 5, image 45 and series 6, image 11). No associated hemorrhage or mass effect. There is a small round 4 millimeter focus of abnormal diffusion in the contralateral right posterior hemisphere white matter (series 3, image 73) which is not as clearly restricted on ADC. No other abnormal diffusion, but widespread and confluent bilateral cerebral white matter T2 and FLAIR hyperintensity. No cortical encephalomalacia or chronic cerebral blood products. The deep gray matter nuclei, brainstem, and cerebellum appear normal for age.  Vascular: Major intracranial vascular flow voids are preserved. Skull and upper cervical spine: Negative visible cervical spine. Visualized bone marrow signal is within normal limits. Sinuses/Orbits: Orbit motion artifact, orbits soft tissues appear within normal limits. Paranasal sinuses and mastoids are stable and well pneumatized. Other: Grossly normal visible internal auditory structures. Scalp and face soft tissues appear negative. MRA HEAD FINDINGS Antegrade flow in the posterior circulation with codominant distal vertebral arteries. Patent PICA origins and vertebrobasilar junction. Patent basilar artery without stenosis. Normal SCA and PCA origins. Posterior communicating arteries are diminutive or absent. Normal bilateral PCA branches. Antegrade flow in both ICA siphons. No siphon stenosis. Patent carotid termini. Normal MCA and ACA origins. Diminutive or absent anterior communicating artery. Visible ACA branches are within normal limits. MCA M1 segments, MCA bi/trifurcations, and visible  MCA branches are within normal limits. IMPRESSION: 1. Small linear acute lacunar infarct in the white matter of the posterior left temporal lobe. No associated hemorrhage or mass effect. 2. Small subacute appearing white matter lacune in the contralateral right hemisphere. Burtis Junes this reflects synchronous small vessel disease (see #3). 3. Confluent bilateral cerebral white matter T2 and FLAIR hyperintensity is nonspecific but most commonly due to chronic small vessel disease. 4. Normal for age intracranial MRA. Electronically Signed   By: Genevie Ann M.D.   On: 04/25/2018 14:43        Scheduled Meds: . aspirin EC  81 mg Oral q morning - 10a  . calcitRIOL  0.25 mcg Oral Daily  . ezetimibe  10 mg Oral q morning - 10a  . famotidine  20 mg Oral Daily  . insulin aspart  0-15 Units Subcutaneous TID WC  . insulin aspart  0-5 Units Subcutaneous QHS  . insulin aspart protamine- aspart  35 Units Subcutaneous Q breakfast   And  . insulin aspart protamine- aspart  40 Units Subcutaneous Q supper  . loratadine  10 mg Oral Daily  . mometasone-formoterol  2 puff Inhalation BID  . ranolazine  500 mg Oral BID  . rivaroxaban  2.5 mg Oral BID  . rosuvastatin  20 mg Oral QPM   Continuous Infusions:   LOS: 2 days    Time spent: 35 minutes.     Hosie Poisson, MD [facility] Pager [phone]  If 7PM-7AM, please contact night-coverage [email or web address] Password [email or web address] 04/27/2018, 1:32 PM

## 2018-04-27 NOTE — Progress Notes (Signed)
[facility] for Xarelto Indication: stroke  Allergies  Allergen Reactions  . Morphine Shortness Of Breath and Swelling  . Penicillins Shortness Of Breath and Swelling    Has patient had a PCN reaction causing immediate rash, facial/tongue/throat swelling, SOB or lightheadedness with hypotension: No Has patient had a PCN reaction causing severe rash involving mucus membranes or skin necrosis: No Has patient had a PCN reaction that required hospitalization Yes Has patient had a PCN reaction occurring within the last 10 years: No If all of the above answers are "NO", then may proceed with Cephalosporin use.   Marland Kitchen Shellfish Allergy     Patient Measurements: Height: 5\' 7"  (170.2 cm) Weight: 169 lb 8.5 oz (76.9 kg) IBW/kg (Calculated) : 61.6  Vital Signs: Temp: 98.7 F (37.1 C) (06/09 1610) Temp Source: Oral (06/09 1610) BP: 175/85 (06/09 1610) Pulse Rate: 90 (06/09 1610)  Labs: Recent Labs    04/25/18 1319 04/27/18 1246 04/27/18 1354  HGB 10.0*  --   --   HCT 31.9*  --   --   PLT 225  --   --   CREATININE 2.28* 2.80*  --   CKTOTAL  --   --  477*  TROPONINI 0.05*  --   --     Estimated Creatinine Clearance: 17.1 mL/min (A) (by C-G formula based on SCr of 2.8 mg/dL (H)).   Medical History: Past Medical History:  Diagnosis Date  . Allergic rhinitis   . CAD (coronary artery disease) 2009   5 stents- #3 in RCA, #1 each in LAD and AVG  . CHF (congestive heart failure) ([place])   . CKD (chronic kidney disease) stage 3, GFR 30-59 ml/min (HCC)   . Complete heart block, transient 2014  . Diastolic dysfunction, left ventricle   . DJD (degenerative joint disease)   . Emphysema lung (HCC)    2L N/C continuously  . Essential hypertension   . GERD (gastroesophageal reflux disease)   . Glaucoma   . Gout   . History of uterine cancer   . HOH (hard of hearing)   . Hyperlipidemia   . MI (myocardial infarction) ([place]) 1998  . Microcytic anemia     History of occult blood in stool  . Myalgia   . Osteoarthritis   . Palpitations   . Personal history of colonic polyps   . Sleep apnea    CPAP  . Type II diabetes mellitus with nephropathy (HCC)     Medications:  Scheduled:  . calcitRIOL  0.25 mcg Oral Daily  . ezetimibe  10 mg Oral q morning - 10a  . famotidine  20 mg Oral Daily  . insulin aspart  0-15 Units Subcutaneous TID WC  . insulin aspart  0-5 Units Subcutaneous QHS  . insulin aspart protamine- aspart  35 Units Subcutaneous Q breakfast   And  . insulin aspart protamine- aspart  40 Units Subcutaneous Q supper  . loratadine  10 mg Oral Daily  . mometasone-formoterol  2 puff Inhalation BID  . ranolazine  500 mg Oral BID  . rivaroxaban  15 mg Oral q1800  . rosuvastatin  20 mg Oral QPM    Assessment: 58 yof on Xarelto low dose prior to admission with known stage IV CKD. Found to have small linear acute lacunar infarct in L temporal lobe + lacunar infarct in R hemisphere. Okay per neurology to resume home Xarelto. Per records looks like patient was on Xarelto prior to admission for CAD. Now  being increased to 15 mg daily per neurology for stroke prevention.   Discussed with team, Scr increasing from 2.28 to 2.8 (CrCl using TBW ~19). Hgb 10, plt 225 on last check. No s/sx of bleeding.  Will start 15 mg tonight since still above CrCl cut off of 15 mL/min. Plan to discuss anticoagulation tomorrow, especially pending Scr trend.   Doylene Canard, PharmD Clinical Pharmacist  Pager: [phone] Phone: [phone] 04/27/2018,5:59 PM

## 2018-04-28 ENCOUNTER — Inpatient Hospital Stay (HOSPITAL_COMMUNITY): Payer: Medicare Other

## 2018-04-28 ENCOUNTER — Encounter: Payer: Self-pay | Admitting: Physician Assistant

## 2018-04-28 DIAGNOSIS — I509 Heart failure, unspecified: Secondary | ICD-10-CM | POA: Diagnosis not present

## 2018-04-28 DIAGNOSIS — R69 Illness, unspecified: Secondary | ICD-10-CM | POA: Diagnosis not present

## 2018-04-28 DIAGNOSIS — G459 Transient cerebral ischemic attack, unspecified: Secondary | ICD-10-CM | POA: Diagnosis not present

## 2018-04-28 DIAGNOSIS — R0902 Hypoxemia: Secondary | ICD-10-CM | POA: Diagnosis not present

## 2018-04-28 DIAGNOSIS — Z743 Need for continuous supervision: Secondary | ICD-10-CM | POA: Diagnosis not present

## 2018-04-28 DIAGNOSIS — Z87891 Personal history of nicotine dependence: Secondary | ICD-10-CM | POA: Diagnosis not present

## 2018-04-28 DIAGNOSIS — K210 Gastro-esophageal reflux disease with esophagitis: Secondary | ICD-10-CM | POA: Diagnosis not present

## 2018-04-28 DIAGNOSIS — R079 Chest pain, unspecified: Secondary | ICD-10-CM | POA: Diagnosis not present

## 2018-04-28 DIAGNOSIS — R41841 Cognitive communication deficit: Secondary | ICD-10-CM | POA: Diagnosis not present

## 2018-04-28 DIAGNOSIS — N189 Chronic kidney disease, unspecified: Secondary | ICD-10-CM | POA: Diagnosis not present

## 2018-04-28 DIAGNOSIS — E1165 Type 2 diabetes mellitus with hyperglycemia: Secondary | ICD-10-CM | POA: Diagnosis not present

## 2018-04-28 DIAGNOSIS — I639 Cerebral infarction, unspecified: Secondary | ICD-10-CM | POA: Diagnosis not present

## 2018-04-28 DIAGNOSIS — Z794 Long term (current) use of insulin: Secondary | ICD-10-CM | POA: Diagnosis not present

## 2018-04-28 DIAGNOSIS — K5900 Constipation, unspecified: Secondary | ICD-10-CM | POA: Diagnosis not present

## 2018-04-28 DIAGNOSIS — R279 Unspecified lack of coordination: Secondary | ICD-10-CM | POA: Diagnosis not present

## 2018-04-28 DIAGNOSIS — I5032 Chronic diastolic (congestive) heart failure: Secondary | ICD-10-CM | POA: Diagnosis not present

## 2018-04-28 DIAGNOSIS — R404 Transient alteration of awareness: Secondary | ICD-10-CM | POA: Diagnosis not present

## 2018-04-28 DIAGNOSIS — J449 Chronic obstructive pulmonary disease, unspecified: Secondary | ICD-10-CM | POA: Diagnosis not present

## 2018-04-28 DIAGNOSIS — R250 Abnormal head movements: Secondary | ICD-10-CM | POA: Diagnosis not present

## 2018-04-28 DIAGNOSIS — E785 Hyperlipidemia, unspecified: Secondary | ICD-10-CM | POA: Diagnosis not present

## 2018-04-28 DIAGNOSIS — E782 Mixed hyperlipidemia: Secondary | ICD-10-CM | POA: Diagnosis not present

## 2018-04-28 DIAGNOSIS — K219 Gastro-esophageal reflux disease without esophagitis: Secondary | ICD-10-CM | POA: Diagnosis not present

## 2018-04-28 DIAGNOSIS — M6281 Muscle weakness (generalized): Secondary | ICD-10-CM | POA: Diagnosis not present

## 2018-04-28 DIAGNOSIS — I110 Hypertensive heart disease with heart failure: Secondary | ICD-10-CM | POA: Diagnosis not present

## 2018-04-28 DIAGNOSIS — Z7951 Long term (current) use of inhaled steroids: Secondary | ICD-10-CM | POA: Diagnosis not present

## 2018-04-28 DIAGNOSIS — I672 Cerebral atherosclerosis: Secondary | ICD-10-CM | POA: Diagnosis not present

## 2018-04-28 DIAGNOSIS — E119 Type 2 diabetes mellitus without complications: Secondary | ICD-10-CM | POA: Diagnosis not present

## 2018-04-28 DIAGNOSIS — R300 Dysuria: Secondary | ICD-10-CM | POA: Diagnosis not present

## 2018-04-28 DIAGNOSIS — R51 Headache: Secondary | ICD-10-CM | POA: Diagnosis not present

## 2018-04-28 DIAGNOSIS — J441 Chronic obstructive pulmonary disease with (acute) exacerbation: Secondary | ICD-10-CM | POA: Diagnosis not present

## 2018-04-28 DIAGNOSIS — R2689 Other abnormalities of gait and mobility: Secondary | ICD-10-CM | POA: Diagnosis not present

## 2018-04-28 DIAGNOSIS — R55 Syncope and collapse: Secondary | ICD-10-CM | POA: Diagnosis not present

## 2018-04-28 DIAGNOSIS — R251 Tremor, unspecified: Secondary | ICD-10-CM | POA: Diagnosis not present

## 2018-04-28 DIAGNOSIS — J9611 Chronic respiratory failure with hypoxia: Secondary | ICD-10-CM | POA: Diagnosis not present

## 2018-04-28 DIAGNOSIS — D649 Anemia, unspecified: Secondary | ICD-10-CM | POA: Diagnosis not present

## 2018-04-28 DIAGNOSIS — I700 Atherosclerosis of aorta: Secondary | ICD-10-CM | POA: Diagnosis not present

## 2018-04-28 DIAGNOSIS — R0602 Shortness of breath: Secondary | ICD-10-CM | POA: Diagnosis not present

## 2018-04-28 DIAGNOSIS — R296 Repeated falls: Secondary | ICD-10-CM | POA: Diagnosis not present

## 2018-04-28 DIAGNOSIS — N390 Urinary tract infection, site not specified: Secondary | ICD-10-CM | POA: Diagnosis not present

## 2018-04-28 DIAGNOSIS — I6789 Other cerebrovascular disease: Secondary | ICD-10-CM | POA: Diagnosis not present

## 2018-04-28 DIAGNOSIS — I130 Hypertensive heart and chronic kidney disease with heart failure and stage 1 through stage 4 chronic kidney disease, or unspecified chronic kidney disease: Secondary | ICD-10-CM | POA: Diagnosis not present

## 2018-04-28 DIAGNOSIS — N184 Chronic kidney disease, stage 4 (severe): Secondary | ICD-10-CM | POA: Diagnosis not present

## 2018-04-28 DIAGNOSIS — I2510 Atherosclerotic heart disease of native coronary artery without angina pectoris: Secondary | ICD-10-CM | POA: Diagnosis not present

## 2018-04-28 DIAGNOSIS — Z79899 Other long term (current) drug therapy: Secondary | ICD-10-CM | POA: Diagnosis not present

## 2018-04-28 DIAGNOSIS — E1122 Type 2 diabetes mellitus with diabetic chronic kidney disease: Secondary | ICD-10-CM | POA: Diagnosis not present

## 2018-04-28 DIAGNOSIS — Z7982 Long term (current) use of aspirin: Secondary | ICD-10-CM | POA: Diagnosis not present

## 2018-04-28 DIAGNOSIS — I491 Atrial premature depolarization: Secondary | ICD-10-CM | POA: Diagnosis not present

## 2018-04-28 DIAGNOSIS — Z7401 Bed confinement status: Secondary | ICD-10-CM | POA: Diagnosis not present

## 2018-04-28 DIAGNOSIS — G319 Degenerative disease of nervous system, unspecified: Secondary | ICD-10-CM | POA: Diagnosis not present

## 2018-04-28 DIAGNOSIS — N183 Chronic kidney disease, stage 3 (moderate): Secondary | ICD-10-CM | POA: Diagnosis not present

## 2018-04-28 DIAGNOSIS — I10 Essential (primary) hypertension: Secondary | ICD-10-CM | POA: Diagnosis not present

## 2018-04-28 LAB — GLUCOSE, CAPILLARY
GLUCOSE-CAPILLARY: 100 mg/dL — AB (ref 65–99)
Glucose-Capillary: 101 mg/dL — ABNORMAL HIGH (ref 65–99)

## 2018-04-28 MED ORDER — ASPIRIN EC 81 MG PO TBEC
81.0000 mg | DELAYED_RELEASE_TABLET | Freq: Every day | ORAL | Status: DC
  Administered 2018-04-28: 81 mg via ORAL
  Filled 2018-04-28: qty 1

## 2018-04-28 MED ORDER — TRAMADOL HCL 50 MG PO TABS: 50.0000 mg | ORAL_TABLET | Freq: Four times a day (QID) | ORAL | 0 refills | Status: AC | PRN

## 2018-04-28 NOTE — Progress Notes (Signed)
I am covering Trish inbasket today. See message below. No TEE needed (do not see this was requested or scheduled). tee  Received: Today  Message Contents  Donzetta Starch, NP  Dalene Seltzer, Cecil Cobbs, NP; [place], PA-C        I am thinking Waunita Schooner contacted you about TEE and loop. This pt does NOT need either from stroke standpoint

## 2018-04-28 NOTE — Discharge Instructions (Addendum)
Information on my medicine - XARELTO (Rivaroxaban)  This medication education was reviewed with me or my healthcare representative as part of my discharge preparation.    Why was Xarelto prescribed for you? Xarelto was prescribed for you to reduce the risk of blood clots forming. The medical term for these abnormal blood clots is venous thromboembolism (VTE).  What do you need to know about xarelto ? Take your Xarelto TWICE DAILY at the same time every day. You may take it either with or without food.  If you have difficulty swallowing the tablet whole, you may crush it and mix in applesauce just prior to taking your dose.  Take Xarelto exactly as prescribed by your doctor and DO NOT stop taking Xarelto without talking to the doctor who prescribed the medication.  Stopping without other VTE prevention medication to take the place of Xarelto may increase your risk of developing a clot.  After discharge, you should have regular check-up appointments with your healthcare provider that is prescribing your Xarelto.    What do you do if you miss a dose? If you miss a dose, take it as soon as you remember on the same day then continue your regularly scheduled twice daily regimen the next day. Do not take more than two doses of Xarelto on the same day.   Important Safety Information A possible side effect of Xarelto is bleeding. You should call your healthcare provider right away if you experience any of the following: ? Bleeding from an injury or your nose that does not stop. ? Unusual colored urine (red or dark brown) or unusual colored stools (red or black). ? Unusual bruising for unknown reasons. ? A serious fall or if you hit your head (even if there is no bleeding).  Some medicines may interact with Xarelto and might increase your risk of bleeding while on Xarelto. To help avoid this, consult your healthcare provider or pharmacist prior to using any new prescription or  non-prescription medications, including herbals, vitamins, non-steroidal anti-inflammatory drugs (NSAIDs) and supplements.  This website has more information on Xarelto: [email or web address]

## 2018-04-28 NOTE — Progress Notes (Signed)
Physical Therapy Treatment Patient Details Name: Krystal [name] MRN: [id number] DOB: 21-Dec-1935 Today's Date: 04/28/2018    History of Present Illness 82 y.o. female with a history of stage IV chronic kidney disease, insulin-dependent diabetes, GERD, hypertension, coronary artery disease, chronic diastolic heart failure, hyperlipidemia.  Patient recently hospitalized last week due to syncopal episode.  She was admitted on 04/19/2018 and was discharged on 6/3.  During that hospitalization she was treated for rhabdomyolysis, and the syncopal episode.  Her findings were nonspecific.  She returns today complaining of 2 months of dizziness, described as room spinning sensation with intermittent nausea.  Her lower extremity weakness, especially on the right lower leg.  No palliating or provoking factors.  Her symptoms are worsening.  She has had some mild headaches, especially on the right side.  Denies vision changes, weakness in the upper extremities per MD note. MRI revealing small linear acute lacunar infarct in white matter of the posterior L temporal lobe.    PT Comments    Patient making steady progress towards PT goals. Was able to tolerate functional task performance, EOB activities and multiple transfers with some ability to take steps today. Cognitively much better then initial assessment. Current POC remains appropriate.   Follow Up Recommendations  SNF;Supervision for mobility/OOB     Equipment Recommendations  (TBD)    Recommendations for Other Services       Precautions / Restrictions Precautions Precautions: Fall    Mobility  Bed Mobility Overal bed mobility: Needs Assistance Bed Mobility: Rolling;Supine to Sit;Sit to Supine   Sidelying to sit: Min guard Supine to sit: Min assist Sit to supine: Min assist   General bed mobility comments: min assist for Left sided movement to EOB and to elevte LLE upon return  Transfers Overall transfer level: Needs  assistance Equipment used: Rolling walker (2 wheeled) Transfers: Sit to/from [facility] Sit to Stand: Min assist Stand pivot transfers: Min assist       General transfer comment: min asssit for power up and stability, increased time and effort to perform  Ambulation/Gait Ambulation/Gait assistance: Min assist Ambulation Distance (Feet): 4 Feet Assistive device: Rolling walker (2 wheeled)           Stairs             Wheelchair Mobility    Modified Rankin (Stroke Patients Only) Modified Rankin (Stroke Patients Only) Pre-Morbid Rankin Score: Moderately severe disability Modified Rankin: Moderately severe disability     Balance                                            Cognition Arousal/Alertness: Awake/alert Behavior During Therapy: WFL for tasks assessed/performed Overall Cognitive Status: Within Functional Limits for tasks assessed                                 General Comments: improved cognitive function this session      Exercises      General Comments        Pertinent Vitals/Pain Pain Assessment: Faces Faces Pain Scale: Hurts even more Pain Location: chronic low back and left sided pain Pain Descriptors / Indicators: Aching;Headache Pain Intervention(s): Monitored during session    Home Living  Prior Function            PT Goals (current goals can now be found in the care plan section) Acute Rehab PT Goals Patient Stated Goal: none stated PT Goal Formulation: Patient unable to participate in goal setting Time For Goal Achievement: 05/10/18 Potential to Achieve Goals: Fair Progress towards PT goals: Progressing toward goals    Frequency    Min 3X/week      PT Plan Current plan remains appropriate    Co-evaluation              AM-PAC PT "6 Clicks" Daily Activity  Outcome Measure  Difficulty turning over in bed (including adjusting  bedclothes, sheets and blankets)?: A Little Difficulty moving from lying on back to sitting on the side of the bed? : Unable Difficulty sitting down on and standing up from a chair with arms (e.g., wheelchair, bedside commode, etc,.)?: Unable Help needed moving to and from a bed to chair (including a wheelchair)?: A Little Help needed walking in hospital room?: A Lot Help needed climbing 3-5 steps with a railing? : A Lot 6 Click Score: 12    End of Session Equipment Utilized During Treatment: Oxygen Activity Tolerance: Patient limited by fatigue Patient left: in bed;with call bell/phone within reach;with nursing/sitter in room Nurse Communication: Mobility status PT Visit Diagnosis: Unsteadiness on feet (R26.81);Other abnormalities of gait and mobility (R26.89);Muscle weakness (generalized) (M62.81)     Time: 1209-1227 PT Time Calculation (min) (ACUTE ONLY): 18 min  Charges:  $Therapeutic Activity: 8-22 mins                    G Codes:       Alben Deeds, PT DPT  Board Certified Neurologic Specialist [place] 04/28/2018, 1:11 PM

## 2018-04-28 NOTE — Care Management Important Message (Signed)
Important Message  Patient Details  Name: Krystal [name] MRN: [id number] Date of Birth: 04/08/1936   Medicare Important Message Given:  Yes    Irini Leet Montine Circle 04/28/2018, 3:44 PM

## 2018-04-28 NOTE — Progress Notes (Signed)
STROKE TEAM PROGRESS NOTE   HISTORY OF PRESENT ILLNESS (per record) Krystal [name] is an 82 y.o. female with a past medical history of stage IV chronic kidney disease, uncontrolled diabetes mellitus-insulin-dependent, GERD, coronary artery disease on low-dose Xarelto, hypertension, chronic diastolic heart failure, dyslipidemia as well as a recent hospitalization, with discharge 04/21/18 with syncope and rhabdomyolysis.  Patient came to the ED with complaints of 2 months of dizziness which she describes as the room spinning with intermittent nausea as well as right lower extremity weakness.  Patient on assessment is very lethargic, she status post a blood glucose 37 in the last 30 minutes and minimally answers questions.  HPI and review of systems obtained from chart.  On admission, patient's creatinine was at baseline, chest x-ray was within normal limits, MRI of the brain showed a small linear acute lacunar infarct in the left temporal lobe as well as a subacute lacunar infarct in the right hemisphere as well as a white matter lesions due to chronic small vessel disease.  LSN: 04/25/18 tPA Given: No: NO LVO  Baseline Premorbid modified Rankin scale (mRS):1    SUBJECTIVE (INTERVAL HISTORY) No family at bedside.   Records indicate that she was put on low dose Xarelto for coronary angina not responding to dual antiplatelet therapy.  She presented with vague symptoms of dizziness and headache.  MRI Brain shows acute infarcts in both parietal lobes, small in size and subcortical and lacunar type..  There is moderate periventricular small vessel ischemic disease.  MRA Brain was negative.  TTE was normal.  LDL 81.  TG 105.  A1c 10.9.    Carotid ultrasound shows no significant significant bilateral extracranial stenosis.    OBJECTIVE Temp:  [97.6 F (36.4 C)-98.8 F (37.1 C)] 98.8 F (37.1 C) (06/10 1225) Pulse Rate:  [46-91] 86 (06/10 1225) Cardiac Rhythm: Other (Comment) (06/10 0700) Resp:   [16-20] 16 (06/10 1225) BP: (150-185)/(52-102) 163/70 (06/10 1225) SpO2:  [97 %-100 %] 100 % (06/10 1225)  CBC:  Recent Labs  Lab 04/25/18 1319  WBC 6.9  NEUTROABS 3.7  HGB 10.0*  HCT 31.9*  MCV 76.3*  PLT 144    Basic Metabolic Panel:  Recent Labs  Lab 04/25/18 1319 04/27/18 1246  NA 139 135  K 4.1 4.0  CL 97* 95*  CO2 34* 29  GLUCOSE 282* 204*  BUN 25* 26*  CREATININE 2.28* 2.80*  CALCIUM 9.1 9.1    Lipid Panel:     Component Value Date/Time   CHOL 132 04/26/2018 0537   TRIG 105 04/26/2018 0537   HDL 30 (L) 04/26/2018 0537   CHOLHDL 4.4 04/26/2018 0537   VLDL 21 04/26/2018 0537   LDLCALC 81 04/26/2018 0537   HgbA1c:  Lab Results  Component Value Date   HGBA1C 10.9 (H) 04/26/2018   Urine Drug Screen: No results found for: LABOPIA, COCAINSCRNUR, LABBENZ, AMPHETMU, THCU, LABBARB  Alcohol Level No results found for: [facility]   IMAGING  Dg Chest 2 View 04/25/2018 IMPRESSION: No active cardiopulmonary disease.    Mr [name] Head Wo Contrast 04/25/2018 IMPRESSION:  1. Small linear acute lacunar infarct in the white matter of the posterior left temporal lobe. No associated hemorrhage or mass effect.  2. Small subacute appearing white matter lacune in the contralateral right hemisphere. Burtis Junes this reflects synchronous small vessel disease (see #3).  3. Confluent bilateral cerebral white matter T2 and FLAIR hyperintensity is nonspecific but most commonly due to chronic small vessel disease.  4. Normal for age  intracranial MRA.      Transthoracic Echocardiogram  04/26/2018 Study Conclusions - Left ventricle: The cavity size was normal. There was moderate   concentric hypertrophy. Systolic function was normal. The   estimated ejection fraction was in the range of 60% to 65%. Wall   motion was normal; there were no regional wall motion   abnormalities. Doppler parameters are consistent with abnormal   left ventricular relaxation (grade 1 diastolic dysfunction).    Doppler parameters are consistent with elevated mean left atrial   filling pressure.   Bilateral Carotid Dopplers - pending 00/00/00    PHYSICAL EXAM Vitals:   04/28/18 0354 04/28/18 0543 04/28/18 0755 04/28/18 1225  BP: (!) 176/102 (!) 150/69 (!) 166/52 (!) 163/70  Pulse: (!) 46 88 91 86  Resp:    16  Temp:   98.3 F (36.8 C) 98.8 F (37.1 C)  TempSrc:   Oral Oral  SpO2: 97%  98% 100%  Weight:      Height:        Neurological Exam ;  Awake  Alert oriented x 3. Normal speech and language.eye movements full without nystagmus.fundi were not visualized. Vision acuity and fields appear normal. Hearing is normal. Palatal movements are normal. Face symmetric. Tongue midline. Normal strength, tone, reflexes and coordination. Normal sensation. Gait deferred.   ASSESSMENT/PLAN [name] is a 82 y.o. female with history of sleep apnea, hyperlipidemia, coronary artery disease with previous MI, hard of hearing, cancer history, hypertension, emphysema, diastolic dysfunction, transient complete heart block, chronic kidney disease, congestive heart failure, and diabetes mellitus presenting with dizziness, syncope, lethargy, and blood glucose of 37. She did not receive IV t-PA due to no LVO.  2 small  l lacunar appearing ischemic infarcts in bilateral subcortical parietal areas.  Her Xarelto was stopped and changed to ASA on admission, but if these are lacunar strokes as suspected,   I will switch her back to Xarelto at  Prior dosage for cardiac prevention and continue aspirin 81 mg   Hypertension and diabetes are her most significant other uncontrolled risk factors.     Stroke: Small acute lacunar infarct in the white matter of the posterior Lt temporal lobe. Small vessel.  Resultant  numbness  CT head - not performed  MRI head -  Small linear acute lacunar infarct in the white matter of the posterior left temporal lobe. Small subacute appearing white matter lacune in the  contralateral right hemisphere.  MRA head - normal  Carotid Doppler - no significant extracranial stenosis bilaterally  2D Echo - EF 60% to 65%. No cardiac source of emboli identified.   LDL - 81  HgbA1c - 10.9  VTE prophylaxis - Xarelto Diet Order           Diet - low sodium heart healthy        Diet heart healthy/carb modified Room service appropriate? Yes; Fluid consistency: Thin  Diet effective now          aspirin 81 mg daily and Xarelto (rivaroxaban) daily prior to admission, now on aspirin 81 mg daily and Xarelto (rivaroxaban) daily  Patient counseled to be compliant with her antithrombotic medications  Ongoing aggressive stroke risk factor management  Therapy recommendations:  SNF recommended Disposition:  snf Hypertension  Blood pressure is running somewhat high . Permissive hypertension (OK if < 220/120) but gradually normalize in 5-7 days . Long-term BP goal normotensive  Hyperlipidemia  Lipid lowering medication PTA: Crestor 20 mg QD and Zetia 10  mg QD  LDL 81, goal < 70  Current lipid lowering medication: Crestor 20 mg QD and Zetia 10 mg QD  Continue statin at discharge  Diabetes  HgbA1c 10.9, goal < 7.0  Uncontrolled  Other Stroke Risk Factors  Advanced age  Former cigarette smoker - quit  Coronary artery disease  Obstructive sleep apnea - Cpap  Previous stroke by MRI  Other Active Problems  Mild anemia  CKD  CAD - Her cardiologist, Dr Bronson Ing started pt. on low dose Xarelto 2.5 mg BID on 01/02/2018 for CAD and stopped plavix.    Plan / Recommendations   Stroke workup: completed  Therapy Follow Up: SNF recommended  Disposition: snf  Antiplatelet / Anticoagulation:  Aspirin 81 and xarelto 2.5 mg twice daily  Statin: Consider increasing Crestor or change to Lipitor  MD Follow Up: [facility] in 6-8 weeks  Other: pending  Further risk factor modification per primary care MD: Follow Up 2  weeks   Hospital day # 3 I had a long discussion with the patient and with Dr. Aundria Mems regarding her bilateral lacunar infarcts from small vessel disease. I do not believe increasing the dose of Xarelto to full dose is necessary. Recommend aggressive risk factor modification and tight control of diabetes and lipids. Follow-up as an outpatient in the stroke clinic in 6 weeks. Stroke team will sign off. Kindly call for questions. Greater than 50% time during this 25 minute visit was spent on counseling and coordination of care of multiple lacunar strokes and answered questions.  Antony Contras, MD  To contact Stroke Continuity provider, please refer to [email or web address] After hours, contact General Neurology

## 2018-04-28 NOTE — Progress Notes (Signed)
SLP Cancellation Note  Patient Details Name: Krystal [name] MRN: [id number] DOB: 1936/07/13   Cancelled treatment:       Reason Eval/Treat Not Completed: Patient at procedure or test/unavailable   Germain Osgood 04/28/2018, 9:44 AM  Germain Osgood, M.A. CCC-SLP [phone]

## 2018-04-28 NOTE — Discharge Summary (Signed)
Physician Discharge Summary  Krystal [name] NFA:[id number] DOB: 1936/03/20 DOA: 04/25/2018  PCP: Iona Beard, MD  Admit date: 04/25/2018 Discharge date: 04/28/2018  Admitted From: Home.  Disposition:  SNF when bed available.   Recommendations for Outpatient Follow-up:  1. Follow up with PCP in 1-2 weeks 2. Please obtain BMP/CBC in one week 3. Please follow up with neurology in 4 to 6 weeks as recommended     Discharge Condition:guarded.  CODE STATUS: DNR Diet recommendation: Heart Healthy / Carb Modified   Brief/Interim Summary: Krystal Nola Macklinis a 82 y.o.femalewith a history of stage IV chronic kidney disease, insulin-dependent diabetes, GERD, hypertension, coronary artery disease, chronic diastolic heart failure, hyperlipidemia. Patient recently hospitalized last week due to syncopal episode. She was admitted on 04/19/2018 and was discharged on 6/3. During that hospitalization she was treated for rhabdomyolysis, and the syncopal episode. Her findings were nonspecific. She returns today complaining of 2 months of dizziness, described as room spinning sensation with intermittent nausea. She was admitted for evaluation of stroke.      Discharge Diagnoses:  Active Problems:   DIASTOLIC DYSFUNCTION, normal LVF   GERD   CKD (chronic kidney disease) stage 3, GFR 30-59 ml/min (HCC)   Essential hypertension   Chronic respiratory failure with hypoxia (HCC)   COPD with chronic bronchitis (HCC)   Type 2 diabetes mellitus with stage 4 chronic kidney disease, with long-term current use of insulin (HCC)   Ischemic stroke ([address])  Left temporal lobe ischemic stroke:  Admitted to telemetry for further evaluation.  MRI brain and MRA head and neck showed  Small linear acute lacunar infarct in the white matter of the posterior left temporal lobe. No associated hemorrhage or mass effect. Small subacute appearing white matter lacune in the contralateral right hemisphere.  Confluent  bilateral cerebral white matter T2 and FLAIR hyperintensity is nonspecific but most commonly due to chronic small vessel disease. Normal for age intracranial MRA.  Echocardiogram showed LVEF of 605 , wall motion normal and no regional wall abnormalities. Grade dysfunction present. No cardiac source of embolus found.  and carotid duplex ordered negative for significant stenosis.  hgba1c is 10.9 LDL is 81.  Restarted the xarelto. Continues with aspirin 81 mg daily. Continue with zetia and crestor.     Type 2 DM uncontrolled With episodes of hypoglycemia:  On 70/30 novolog mix CBG (last 3)  Recent Labs    04/27/18 1609 04/27/18 2104 04/28/18 0632  GLUCAP 158* 135* 100*     Decreased the dose of am dose of novolog.  Better controlled.    Hyperlipidemia:  Resume crestor and zetia.    Stage 3 to 4 CKD: Creatinine at baseline around 2.   Chronic diastolic heart failure: Compensated.    COPD: No wheezing heard.  Resume dulera.    Recent treated rhabdomyolysis.  CK levels slightly elevated.    Hypertension;  Restart pts bp meds on discharge       Discharge Instructions  Discharge Instructions    Diet - low sodium heart healthy   Complete by:  As directed    Discharge instructions   Complete by:  As directed    Please follow up with neurology as recommended.     Allergies as of 04/28/2018      Reactions   Morphine Shortness Of Breath, Swelling   Penicillins Shortness Of Breath, Swelling   Has patient had a PCN reaction causing immediate rash, facial/tongue/throat swelling, SOB or lightheadedness with hypotension: No Has patient had a  PCN reaction causing severe rash involving mucus membranes or skin necrosis: No Has patient had a PCN reaction that required hospitalization Yes Has patient had a PCN reaction occurring within the last 10 years: No If all of the above answers are "NO", then may proceed with Cephalosporin use.   Shellfish  Allergy       Medication List    TAKE these medications   ACCU-CHEK FASTCLIX LANCETS Misc USE AS DIRECTED TWICE DAILY.   ONETOUCH DELICA LANCETS 22G Misc USE AS DIRECTED THREE TIMES DAILY.   aspirin 81 MG EC tablet Take 81 mg by mouth every morning.   budesonide-formoterol 160-4.5 MCG/ACT inhaler Commonly known as:  SYMBICORT Inhale 2 puffs into the lungs 2 (two) times daily.   calcitRIOL 0.25 MCG capsule Commonly known as:  ROCALTROL TAKE 1 CAPSULE BY MOUTH ONCE A DAY.   ergocalciferol 50000 units capsule Commonly known as:  VITAMIN D2 Take 50,000 Units by mouth once a week.   ezetimibe 10 MG tablet Commonly known as:  ZETIA TAKE 1 TABLET BY MOUTH EVERY MORNING.   famotidine 20 MG tablet Commonly known as:  PEPCID Take 1 tablet (20 mg total) by mouth daily.   FREESTYLE LIBRE SENSOR SYSTEM Misc Use one sensor every 10 days.   glucose blood test strip Commonly known as:  ACCU-CHEK GUIDE Use as instructed 4 x daily   insulin aspart protamine - aspart (70-30) 100 UNIT/ML FlexPen Commonly known as:  NOVOLOG MIX 70/30 FLEXPEN 60 units in the a.m. And 40 units at night   Insulin Pen Needle 31G X 8 MM Misc Commonly known as:  B-D ULTRAFINE III SHORT PEN 1 each by Does not apply route as directed.   BD PEN NEEDLE NANO U/F 32G X 4 MM Misc Generic drug:  Insulin Pen Needle USE AS DIRECTED TWICE DAILY.   isosorbide mononitrate 60 MG 24 hr tablet Commonly known as:  IMDUR TAKE (1) TABLET BY MOUTH ONCE DAILY.   lisinopril 20 MG tablet Commonly known as:  PRINIVIL,ZESTRIL TAKE (1) TABLET BY MOUTH ONCE DAILY.   loratadine 10 MG tablet Commonly known as:  CLARITIN Take 10 mg by mouth daily.   meclizine 25 MG tablet Commonly known as:  ANTIVERT Take 25 mg by mouth 4 (four) times daily as needed for dizziness.   metoprolol tartrate 25 MG tablet Commonly known as:  LOPRESSOR Take 1 tablet (25 mg total) by mouth 2 (two) times daily.   nitroGLYCERIN 0.4 MG SL  tablet Commonly known as:  NITROSTAT Place 1 tablet (0.4 mg total) under the tongue every 5 (five) minutes as needed for chest pain.   polyethylene glycol packet Commonly known as:  MIRALAX / GLYCOLAX Take 17 g by mouth daily as needed for mild constipation.   RANEXA 500 MG 12 hr tablet Generic drug:  ranolazine TAKE (2) TABLETS BY MOUTH TWICE DAILY.   rivaroxaban 2.5 MG Tabs tablet Commonly known as:  XARELTO Take 1 tablet (2.5 mg total) by mouth 2 (two) times daily.   rosuvastatin 20 MG tablet Commonly known as:  CRESTOR Take 1 tablet (20 mg total) by mouth every evening.   sennosides-docusate sodium 8.6-50 MG tablet Commonly known as:  SENOKOT-S Take 1 tablet by mouth daily as needed for constipation.   traMADol 50 MG tablet Commonly known as:  ULTRAM Take 1 tablet (50 mg total) by mouth every 6 (six) hours as needed for moderate pain.       Allergies  Allergen Reactions  . Morphine  Shortness Of Breath and Swelling  . Penicillins Shortness Of Breath and Swelling    Has patient had a PCN reaction causing immediate rash, facial/tongue/throat swelling, SOB or lightheadedness with hypotension: No Has patient had a PCN reaction causing severe rash involving mucus membranes or skin necrosis: No Has patient had a PCN reaction that required hospitalization Yes Has patient had a PCN reaction occurring within the last 10 years: No If all of the above answers are "NO", then may proceed with Cephalosporin use.   Marland Kitchen Shellfish Allergy     Consultations:  Neurology.    Procedures/Studies: Dg Chest 2 View  Result Date: 04/25/2018 CLINICAL DATA:  Shortness of breath. EXAM: CHEST - 2 VIEW COMPARISON:  Radiographs of April 19, 2018. FINDINGS: The heart size and mediastinal contours are within normal limits. Both lungs are clear. No pneumothorax or pleural effusion is noted. The visualized skeletal structures are unremarkable. IMPRESSION: No active cardiopulmonary disease.  Electronically Signed   By: Marijo Conception, M.D.   On: 04/25/2018 14:45   Dg Chest 2 View  Result Date: 04/19/2018 CLINICAL DATA:  Fall. EXAM: CHEST - 2 VIEW COMPARISON:  Apr 11, 2018 FINDINGS: The heart size and mediastinal contours are within normal limits. Both lungs are clear. The visualized skeletal structures are unremarkable. IMPRESSION: No active cardiopulmonary disease. Electronically Signed   By: Dorise Bullion III M.D   On: 04/19/2018 09:11   Dg Chest 2 View  Result Date: 04/11/2018 CLINICAL DATA:  Confusion EXAM: CHEST - 2 VIEW COMPARISON:  January 21, 2018 FINDINGS: There is no edema or consolidation. Heart is upper normal in size with pulmonary vascularity within normal limits. No adenopathy. No evident bone lesions. IMPRESSION: No edema or consolidation. Electronically Signed   By: Lowella Grip III M.D.   On: 04/11/2018 15:12   Dg Lumbar Spine Complete  Result Date: 04/19/2018 CLINICAL DATA:  Fall with subsequent low back pain. EXAM: LUMBAR SPINE - COMPLETE 4+ VIEW COMPARISON:  CT 12/12/2015 FINDINGS: Five lumbar type vertebral bodies show normal alignment. Question mild superior endplate fracture at L4. This is not definite. No other fracture seen or suspected. Chronic disc space narrowing in the lower lumbar spine. Chronic lower lumbar facet arthritis. Aortic atherosclerosis. Right renal stone. IMPRESSION: Question minor superior endplate fracture at L4. This is not definite. Ordinary lower lumbar degenerative changes otherwise. Electronically Signed   By: Nelson Chimes M.D.   On: 04/19/2018 09:16   Ct Head Wo Contrast  Result Date: 04/19/2018 CLINICAL DATA:  Fall this morning EXAM: CT HEAD WITHOUT CONTRAST TECHNIQUE: Contiguous axial images were obtained from the base of the skull through the vertex without intravenous contrast. COMPARISON:  Head CT dated 04/16/2018. FINDINGS: Brain: Ventricles are stable in size and configuration. Confluent chronic small vessel ischemic changes  again noted within the bilateral periventricular and subcortical white matter regions. No mass, hemorrhage, edema or other evidence of acute parenchymal abnormality. No extra-axial hemorrhage. Vascular: Insert atherosclerosis. Skull: Normal. Negative for fracture or focal lesion. Sinuses/Orbits: No acute finding. Other: None. IMPRESSION: 1. No acute findings. No intracranial mass, hemorrhage or edema. No skull fracture. 2. Chronic small vessel ischemic changes throughout the white matter. Electronically Signed   By: Franki Cabot M.D.   On: 04/19/2018 09:03   Ct Head Wo Contrast  Result Date: 04/17/2018 CLINICAL DATA:  Patient status post fall 2 weeks prior.  Dizziness. EXAM: CT HEAD WITHOUT CONTRAST TECHNIQUE: Contiguous axial images were obtained from the base of the skull through the  vertex without intravenous contrast. COMPARISON:  Brain CT 04/11/2018. FINDINGS: Brain: Ventricles and sulci are prominent compatible with atrophy. Periventricular and subcortical white matter hypodensity compatible with chronic microvascular ischemic changes. No evidence for acute cortically based infarct, intracranial hemorrhage, mass lesion or mass-effect. Vascular: Internal carotid arterial vascular calcifications. Skull: Intact. Sinuses/Orbits: Paranasal sinuses are well aerated. Mastoid air cells unremarkable. Other: None. IMPRESSION: No acute intracranial process. Atrophy and chronic microvascular ischemic changes. Electronically Signed   By: Lovey Newcomer M.D.   On: 04/17/2018 08:22   Ct Head Wo Contrast  Result Date: 04/11/2018 CLINICAL DATA:  Confusion.  Hypertension.  Headache. EXAM: CT HEAD WITHOUT CONTRAST TECHNIQUE: Contiguous axial images were obtained from the base of the skull through the vertex without intravenous contrast. COMPARISON:  January 21, 2018 FINDINGS: Brain: There is age related volume loss. There is no intracranial mass, hemorrhage, extra-axial fluid collection, or midline shift. There is patchy  small vessel disease throughout the centra semiovale bilaterally, stable. No new gray-white compartment lesions are identified. There is no evident acute infarct. Vascular: There is no hyperdense vessel. There is calcification in the carotid siphon regions bilaterally. Skull: Bony calvarium appears intact. Sinuses/Orbits: There is mucosal thickening in several ethmoid air cells bilaterally. Other visualized paranasal sinuses are clear. Visualized orbits appear symmetric bilaterally. Other: Mastoid air cells are clear. IMPRESSION: Age related volume loss with patchy supratentorial small vessel disease, stable. No acute infarct evident. No mass or hemorrhage. There are foci of arterial vascular calcification. There is mucosal thickening in several ethmoid air cells. Electronically Signed   By: Lowella Grip III M.D.   On: 04/11/2018 14:39   Mr [name] Head Wo Contrast  Result Date: 04/25/2018 CLINICAL DATA:  82 year old female with ataxia and weakness. EXAM: MRI HEAD WITHOUT CONTRAST MRA HEAD WITHOUT CONTRAST TECHNIQUE: Multiplanar, multiecho pulse sequences of the brain and surrounding structures were obtained without intravenous contrast. Angiographic images of the head were obtained using MRA technique without contrast. COMPARISON:  Head CTs without contrast 04/19/2018 and earlier. FINDINGS: MRI HEAD FINDINGS Brain: There is a linear focus of restricted diffusion in the left hemisphere periatrial white matter tracking to the subcortical white matter of the posterior left temporal lobe (series 5, image 45 and series 6, image 11). No associated hemorrhage or mass effect. There is a small round 4 millimeter focus of abnormal diffusion in the contralateral right posterior hemisphere white matter (series 3, image 73) which is not as clearly restricted on ADC. No other abnormal diffusion, but widespread and confluent bilateral cerebral white matter T2 and FLAIR hyperintensity. No cortical encephalomalacia or chronic  cerebral blood products. The deep gray matter nuclei, brainstem, and cerebellum appear normal for age. Vascular: Major intracranial vascular flow voids are preserved. Skull and upper cervical spine: Negative visible cervical spine. Visualized bone marrow signal is within normal limits. Sinuses/Orbits: Orbit motion artifact, orbits soft tissues appear within normal limits. Paranasal sinuses and mastoids are stable and well pneumatized. Other: Grossly normal visible internal auditory structures. Scalp and face soft tissues appear negative. MRA HEAD FINDINGS Antegrade flow in the posterior circulation with codominant distal vertebral arteries. Patent PICA origins and vertebrobasilar junction. Patent basilar artery without stenosis. Normal SCA and PCA origins. Posterior communicating arteries are diminutive or absent. Normal bilateral PCA branches. Antegrade flow in both ICA siphons. No siphon stenosis. Patent carotid termini. Normal MCA and ACA origins. Diminutive or absent anterior communicating artery. Visible ACA branches are within normal limits. MCA M1 segments, MCA bi/trifurcations, and visible MCA branches  are within normal limits. IMPRESSION: 1. Small linear acute lacunar infarct in the white matter of the posterior left temporal lobe. No associated hemorrhage or mass effect. 2. Small subacute appearing white matter lacune in the contralateral right hemisphere. Burtis Junes this reflects synchronous small vessel disease (see #3). 3. Confluent bilateral cerebral white matter T2 and FLAIR hyperintensity is nonspecific but most commonly due to chronic small vessel disease. 4. Normal for age intracranial MRA. Electronically Signed   By: Genevie Ann M.D.   On: 04/25/2018 14:43   Mr Brain Wo Contrast  Result Date: 04/25/2018 CLINICAL DATA:  82 year old female with ataxia and weakness. EXAM: MRI HEAD WITHOUT CONTRAST MRA HEAD WITHOUT CONTRAST TECHNIQUE: Multiplanar, multiecho pulse sequences of the brain and surrounding  structures were obtained without intravenous contrast. Angiographic images of the head were obtained using MRA technique without contrast. COMPARISON:  Head CTs without contrast 04/19/2018 and earlier. FINDINGS: MRI HEAD FINDINGS Brain: There is a linear focus of restricted diffusion in the left hemisphere periatrial white matter tracking to the subcortical white matter of the posterior left temporal lobe (series 5, image 45 and series 6, image 11). No associated hemorrhage or mass effect. There is a small round 4 millimeter focus of abnormal diffusion in the contralateral right posterior hemisphere white matter (series 3, image 73) which is not as clearly restricted on ADC. No other abnormal diffusion, but widespread and confluent bilateral cerebral white matter T2 and FLAIR hyperintensity. No cortical encephalomalacia or chronic cerebral blood products. The deep gray matter nuclei, brainstem, and cerebellum appear normal for age. Vascular: Major intracranial vascular flow voids are preserved. Skull and upper cervical spine: Negative visible cervical spine. Visualized bone marrow signal is within normal limits. Sinuses/Orbits: Orbit motion artifact, orbits soft tissues appear within normal limits. Paranasal sinuses and mastoids are stable and well pneumatized. Other: Grossly normal visible internal auditory structures. Scalp and face soft tissues appear negative. MRA HEAD FINDINGS Antegrade flow in the posterior circulation with codominant distal vertebral arteries. Patent PICA origins and vertebrobasilar junction. Patent basilar artery without stenosis. Normal SCA and PCA origins. Posterior communicating arteries are diminutive or absent. Normal bilateral PCA branches. Antegrade flow in both ICA siphons. No siphon stenosis. Patent carotid termini. Normal MCA and ACA origins. Diminutive or absent anterior communicating artery. Visible ACA branches are within normal limits. MCA M1 segments, MCA bi/trifurcations, and  visible MCA branches are within normal limits. IMPRESSION: 1. Small linear acute lacunar infarct in the white matter of the posterior left temporal lobe. No associated hemorrhage or mass effect. 2. Small subacute appearing white matter lacune in the contralateral right hemisphere. Burtis Junes this reflects synchronous small vessel disease (see #3). 3. Confluent bilateral cerebral white matter T2 and FLAIR hyperintensity is nonspecific but most commonly due to chronic small vessel disease. 4. Normal for age intracranial MRA. Electronically Signed   By: Genevie Ann M.D.   On: 04/25/2018 14:43       Subjective: Reports feeling tired. No chest pain or sob, no headache, nausea or vomiting.   Discharge Exam: Vitals:   04/28/18 0543 04/28/18 0755  BP: (!) 150/69 (!) 166/52  Pulse: 88 91  Resp:    Temp:  98.3 F (36.8 C)  SpO2:  98%   Vitals:   04/28/18 0352 04/28/18 0354 04/28/18 0543 04/28/18 0755  BP: (!) 185/85 (!) 176/102 (!) 150/69 (!) 166/52  Pulse: (!) 48 (!) 46 88 91  Resp: 16     Temp: 97.6 F (36.4 C)   98.3  F (36.8 C)  TempSrc: Oral   Oral  SpO2: 97% 97%  98%  Weight:      Height:        General: Pt is alert, awake, not in acute distress Cardiovascular: RRR, S1/S2 +, no rubs, no gallops Respiratory: CTA bilaterally, no wheezing, no rhonchi Abdominal: Soft, NT, ND, bowel sounds + Extremities: no edema, no cyanosis    The results of significant diagnostics from this hospitalization (including imaging, microbiology, ancillary and laboratory) are listed below for reference.     Microbiology: Recent Results (from the past 240 hour(s))  Urine culture     Status: None   Collection Time: 04/19/18  8:01 AM  Result Value Ref Range Status   Specimen Description   Final    URINE, CATHETERIZED Performed at [facility], [address] [place], [place] [address]    Special Requests   Final    NONE Performed at [facility], [address] [place], [place] [address]    Culture    Final    NO GROWTH Performed at [facility], [place] [address] [place], [place] [address]    Report Status 04/20/2018 FINAL  Final     Labs: BNP (last 3 results) Recent Labs    04/25/18 1319  BNP 166.0*   Basic Metabolic Panel: Recent Labs  Lab 04/25/18 1319 04/27/18 1246  NA 139 135  K 4.1 4.0  CL 97* 95*  CO2 34* 29  GLUCOSE 282* 204*  BUN 25* 26*  CREATININE 2.28* 2.80*  CALCIUM 9.1 9.1   Liver Function Tests: Recent Labs  Lab 04/25/18 1319  AST 26  ALT 20  ALKPHOS 53  BILITOT 0.7  PROT 6.4*  ALBUMIN 3.1*   No results for input(s): LIPASE, AMYLASE in the last 168 hours. No results for input(s): AMMONIA in the last 168 hours. CBC: Recent Labs  Lab 04/25/18 1319  WBC 6.9  NEUTROABS 3.7  HGB 10.0*  HCT 31.9*  MCV 76.3*  PLT 225   Cardiac Enzymes: Recent Labs  Lab 04/25/18 1319 04/27/18 1354  CKTOTAL  --  477*  TROPONINI 0.05*  --    BNP: Invalid input(s): POCBNP CBG: Recent Labs  Lab 04/27/18 0616 04/27/18 1134 04/27/18 1609 04/27/18 2104 04/28/18 0632  GLUCAP 94 114* 158* 135* 100*   D-Dimer No results for input(s): DDIMER in the last 72 hours. Hgb A1c Recent Labs    04/26/18 0537  HGBA1C 10.9*   Lipid Profile Recent Labs    04/26/18 0537  CHOL 132  HDL 30*  LDLCALC 81  TRIG 105  CHOLHDL 4.4   Thyroid function studies No results for input(s): TSH, T4TOTAL, T3FREE, THYROIDAB in the last 72 hours.  Invalid input(s): FREET3 Anemia work up No results for input(s): VITAMINB12, FOLATE, FERRITIN, TIBC, IRON, RETICCTPCT in the last 72 hours. Urinalysis    Component Value Date/Time   COLORURINE YELLOW 04/25/2018 1331   APPEARANCEUR CLEAR 04/25/2018 1331   LABSPEC 1.008 04/25/2018 1331   PHURINE 6.0 04/25/2018 1331   GLUCOSEU 50 (A) 04/25/2018 1331   HGBUR SMALL (A) 04/25/2018 1331   HGBUR negative 04/14/2009 1408   BILIRUBINUR NEGATIVE 04/25/2018 1331   KETONESUR NEGATIVE 04/25/2018 1331   PROTEINUR 100 (A)  04/25/2018 1331   UROBILINOGEN 0.2 03/05/2014 1106   NITRITE NEGATIVE 04/25/2018 1331   LEUKOCYTESUR SMALL (A) 04/25/2018 1331   Sepsis Labs Invalid input(s): PROCALCITONIN,  WBC,  LACTICIDVEN Microbiology Recent Results (from the past 240 hour(s))  Urine culture  Status: None   Collection Time: 04/19/18  8:01 AM  Result Value Ref Range Status   Specimen Description   Final    URINE, CATHETERIZED Performed at [facility], [address], [place], [place] [address]    Special Requests   Final    NONE Performed at [facility], [address] [place],  [place] [address]    Culture   Final    NO GROWTH Performed at [facility], [place] [address] [place], [place] [address]    Report Status 04/20/2018 FINAL  Final     Time coordinating discharge: 32 minutes  SIGNED:   Hosie Poisson, MD  [facility] 04/28/2018, 9:33 AM Pager   If 7PM-7AM, please contact night-coverage [email or web address] Password [email or web address]

## 2018-04-28 NOTE — Progress Notes (Signed)
PT Cancellation Note  Patient Details Name: Krystal [name] MRN: [id number] DOB: [birth year]   Cancelled Treatment:    Reason Eval/Treat Not Completed: Patient at procedure or test/unavailable   Duncan Dull 04/28/2018, 9:58 AM

## 2018-04-28 NOTE — Clinical Social Work Placement (Addendum)
Nurse to call report to [facility] at [phone], Room 145-1  Transport set for 3:30 PM     CLINICAL SOCIAL WORK PLACEMENT  NOTE  Date:  04/28/2018  Patient Details  Name: Krystal [name] MRN: [id number] Date of Birth: 1936/10/28  Clinical Social Work is seeking post-discharge placement for this patient at the [place] level of care (*CSW will initial, date and re-position this form in  chart as items are completed):  Yes   Patient/family provided with [place] Work Department's list of facilities offering this level of care within the geographic area requested by the patient (or if unable, by the patient's family).  Yes   Patient/family informed of their freedom to choose among providers that offer the needed level of care, that participate in Medicare, Medicaid or managed care program needed by the patient, have an available bed and are willing to accept the patient.  Yes   Patient/family informed of [facility]'s ownership interest in [facility] and [facility], as well as of the fact that they are under no obligation to receive care at these facilities.  PASRR submitted to EDS on       PASRR number received on       Existing PASRR number confirmed on       FL2 transmitted to all facilities in geographic area requested by pt/family on       FL2 transmitted to all facilities within larger geographic area on       Patient informed that his/her managed care company has contracts with or will negotiate with certain facilities, including the following:        Yes   Patient/family informed of bed offers received.  Patient chooses bed at [facility]     Physician recommends and patient chooses bed at      Patient to be transferred to [facility] on 04/28/18.  Patient to be transferred to facility by [facility]     Patient family notified on 04/28/18 of transfer.  Name of family member notified:  Dorothy      PHYSICIAN       Additional Comment:    _______________________________________________ Geralynn Ochs, [place] 04/28/2018, 2:23 PM

## 2018-04-28 NOTE — Progress Notes (Signed)
Patient discharging to [facility] nursing center [facility]. All belonging sent with patient. Nurse will call report.

## 2018-04-28 NOTE — Progress Notes (Addendum)
VASCULAR LAB PRELIMINARY  PRELIMINARY  PRELIMINARY  PRELIMINARY  Carotid duplex completed.    Preliminary report:  1-39% ICA stenosis. Vertebral artery flow is antegrade.   Denson Niccoli, RVT 04/28/2018, 10:26 AM

## 2018-05-16 ENCOUNTER — Other Ambulatory Visit: Payer: Self-pay | Admitting: Cardiovascular Disease

## 2018-05-29 ENCOUNTER — Ambulatory Visit (INDEPENDENT_AMBULATORY_CARE_PROVIDER_SITE_OTHER): Payer: Medicare Other | Admitting: Adult Health

## 2018-05-29 ENCOUNTER — Encounter: Payer: Self-pay | Admitting: Adult Health

## 2018-05-29 VITALS — BP 137/92 | HR 72 | Ht 67.0 in

## 2018-05-29 DIAGNOSIS — E1122 Type 2 diabetes mellitus with diabetic chronic kidney disease: Secondary | ICD-10-CM | POA: Diagnosis not present

## 2018-05-29 DIAGNOSIS — I639 Cerebral infarction, unspecified: Secondary | ICD-10-CM | POA: Diagnosis not present

## 2018-05-29 DIAGNOSIS — I10 Essential (primary) hypertension: Secondary | ICD-10-CM

## 2018-05-29 DIAGNOSIS — E782 Mixed hyperlipidemia: Secondary | ICD-10-CM

## 2018-05-29 DIAGNOSIS — Z794 Long term (current) use of insulin: Secondary | ICD-10-CM | POA: Diagnosis not present

## 2018-05-29 DIAGNOSIS — N184 Chronic kidney disease, stage 4 (severe): Secondary | ICD-10-CM

## 2018-05-29 MED ORDER — GABAPENTIN 300 MG PO CAPS: 300.0000 mg | ORAL_CAPSULE | Freq: Every day | ORAL | 3 refills | Status: AC

## 2018-05-29 NOTE — Patient Instructions (Signed)
Continue aspirin 81 mg daily and Xarelto (rivaroxaban) daily  and lipitor  for secondary stroke prevention  Start gabapentin 300mg  nightly to help with nerve pain  Continue to follow up with PCP regarding cholesterol, diabetes and blood pressure management   Continue current therapies   Continue to monitor blood pressure at home  Maintain strict control of hypertension with blood pressure goal below 130/90, diabetes with hemoglobin A1c goal below 6.5% and cholesterol with LDL cholesterol (bad cholesterol) goal below 70 mg/dL. I also advised the patient to eat a healthy diet with plenty of whole grains, cereals, fruits and vegetables, exercise regularly and maintain ideal body weight.  Followup in the future with me in 3 months or call earlier if needed       Thank you for coming to see [place] at [facility]. I hope we have been able to provide you high quality care today.  You may receive a patient satisfaction survey over the next few weeks. We would appreciate your feedback and comments so that we may continue to improve ourselves and the health of our patients.

## 2018-05-29 NOTE — Progress Notes (Signed)
I agree with the above plan 

## 2018-05-29 NOTE — Progress Notes (Signed)
[facility] [address] [place]. [place] [address] [phone]       OFFICE FOLLOW UP NOTE  [name] Date of Birth:  05/13/1936 Medical Record Number:  [id number]   Reason for Referral:  hospital stroke follow up  CHIEF COMPLAINT:  Chief Complaint  Patient presents with  . Follow-up    Hospital Stroke follow up from June 2019, Pt saw Dr.SEthi in hospital, PT is at [facility] facility pt is alone no family present, pt in wheelchair    HPI: Krystal [name] is being seen today for initial visit in the office for small acute lacunar infarct on 04/27/2018. History obtained from patient and chart review. Reviewed all radiology images and labs personally.  Krystal [name] is an 82 y.o. female with a past medical history of stage IV chronic kidney disease, uncontrolled diabetes mellitus-insulin-dependent, GERD, coronary artery disease on low-dose Xarelto, hypertension, chronic diastolic heart failure, dyslipidemia as well as a recent hospitalization, with discharge 04/21/18 with syncope and rhabdomyolysis. Patient came to the ED with complaints of 2 months of dizziness which she described as the room spinning with intermittent nausea as well as right lower extremity weakness. Blood glucose on admission at 37.On admission, patient's creatinine was at baseline, chest x-ray was within normal limits, MRI of the brain showed a small linear acute lacunar infarct in the left temporal lobe as well as a subacute lacunar infarct in the right hemisphere as well as a white matterlesionsdue to chronic small vessel disease.  MRA head was normal.  Carotid Doppler showed no significant extracranial stenosis bilaterally.  2D echo showed an EF of 60 to 65% without cardiac source of embolus.  LDL 81 and recommended for patient to continue Crestor 20 mg daily along with Zetia 10 mg daily.  A1c elevated at 10.9 recommended tight glycemic control with close PCP follow-up.  Patient was  on Xarelto for CAD and aspirin 81 mg daily PTA and this was recommended to continue at discharge.  Therapies recommended SNF for continued therapies.  Patient is being seen today for hospital follow-up and is still continue to complain of dizziness while standing but this has been improving.  She is currently residing at [facility] where she continues to receive PT/OT/ST.  Rehab and nursing care center she continues to take her also on aspirin without side effects of bleeding or bruising.  Continues to take Crestor and Zetia without side effects myalgias.  Blood pressure today satisfactory 137/92.  She also has complaints of discrete sensation on her left side where she does feel a burning sensation along with radiating electrical shocks.  Denies new or worsening stroke/TIA symptoms at this time.   ROS:   14 system review of systems performed and negative with exception of swelling in legs, spinning sensation, rash, constipation, allergies, confusion, headache, dizziness, passing out, change in appetite, and snoring  PMH:  Past Medical History:  Diagnosis Date  . Allergic rhinitis   . CAD (coronary artery disease) 2009   5 stents- #3 in RCA, #1 each in LAD and AVG  . CHF (congestive heart failure) ([place])   . CKD (chronic kidney disease) stage 3, GFR 30-59 ml/min (HCC)   . Complete heart block, transient 2014  . Diastolic dysfunction, left ventricle   . DJD (degenerative joint disease)   . Emphysema lung (HCC)    2L N/C continuously  . Essential hypertension   . GERD (gastroesophageal reflux disease)   . Glaucoma   .  Gout   . History of uterine cancer   . HOH (hard of hearing)   . Hyperlipidemia   . MI (myocardial infarction) ([place]) 1998  . Microcytic anemia    History of occult blood in stool  . Myalgia   . Osteoarthritis   . Palpitations   . Personal history of colonic polyps   . Sleep apnea    CPAP  . Stroke ([place])   . Type II diabetes mellitus with nephropathy (HCC)      PSH:  Past Surgical History:  Procedure Laterality Date  . ABDOMINAL HYSTERECTOMY  04/2010   Uterine cancer,TAHBSO  . CERVICAL DISCECTOMY     L5 left/hemilminectomy  . COLONOSCOPY  09/2006   Int hemmorhoids, COMPLICATED BY CARDIOPULMONARY COMPLICATIONS  . CORONARY ANGIOPLASTY  1/99, 1/07, 1/08, 4/09   5 cardiac stents total  . ECTOPIC PREGNANCY SURGERY    . FOOT SURGERY     Left and right for callous  . LEFT AND RIGHT HEART CATHETERIZATION WITH CORONARY ANGIOGRAM N/A 09/12/2012   Procedure: LEFT AND RIGHT HEART CATHETERIZATION WITH CORONARY ANGIOGRAM;  Surgeon: Sanda Klein, MD;  Location: [facility] CATH [facility];  Service: Cardiovascular;  Laterality: N/A;  . POLYPECTOMY  10/22/2011   Internal hemorrhoids/sessile polyp    Social History:  Social History   Socioeconomic History  . Marital status: Widowed    Spouse name: Not on file  . Number of children: 0  . Years of education: 69  . Highest education level: Not on file  Occupational History    Comment: retired  Scientific laboratory technician  . Financial resource strain: Not on file  . Food insecurity:    Worry: Not on file    Inability: Not on file  . Transportation needs:    Medical: Not on file    Non-medical: Not on file  Tobacco Use  . Smoking status: Former Smoker    Packs/day: 0.40    Years: 51.00    Pack years: 20.40    Types: Cigarettes    Start date: 04/22/1955    Last attempt to quit: 10/18/2006    Years since quitting: 11.6  . Smokeless tobacco: Never Used  . Tobacco comment: quit about 4 yrs ago  Substance and Sexual Activity  . Alcohol use: No    Alcohol/week: 0.0 oz  . Drug use: No  . Sexual activity: Never    Birth control/protection: Surgical  Lifestyle  . Physical activity:    Days per week: Not on file    Minutes per session: Not on file  . Stress: Not on file  Relationships  . Social connections:    Talks on phone: Not on file    Gets together: Not on file    Attends religious service: Not on file     Active member of club or organization: Not on file    Attends meetings of clubs or organizations: Not on file    Relationship status: Not on file  . Intimate partner violence:    Fear of current or ex partner: Not on file    Emotionally abused: Not on file    Physically abused: Not on file    Forced sexual activity: Not on file  Other Topics Concern  . Not on file  Social History Narrative   Consumes 2 cups of caffeine daily    Family History:  Family History  Problem Relation Age of Onset  . Anesthesia problems Neg Hx   . Hypotension Neg Hx   . Malignant hyperthermia  Neg Hx   . Pseudochol deficiency Neg Hx     Medications:   Current Outpatient Medications on File Prior to Visit  Medication Sig Dispense Refill  . ACCU-CHEK FASTCLIX LANCETS MISC USE AS DIRECTED TWICE DAILY. 102 each 11  . aspirin 81 MG EC tablet Take 81 mg by mouth every morning.     . BD PEN NEEDLE NANO U/F 32G X 4 MM MISC USE AS DIRECTED TWICE DAILY. 60 each 10  . budesonide-formoterol (SYMBICORT) 160-4.5 MCG/ACT inhaler Inhale 2 puffs into the lungs 2 (two) times daily.    . calcitRIOL (ROCALTROL) 0.25 MCG capsule TAKE 1 CAPSULE BY MOUTH ONCE A DAY. 30 capsule 11  . Continuous Blood Gluc Sensor (FREESTYLE LIBRE SENSOR SYSTEM) MISC Use one sensor every 10 days. 3 each 2  . ergocalciferol (VITAMIN D2) 50000 units capsule Take 50,000 Units by mouth once a week.    . ezetimibe (ZETIA) 10 MG tablet TAKE 1 TABLET BY MOUTH EVERY MORNING. 30 tablet 6  . famotidine (PEPCID) 20 MG tablet Take 1 tablet (20 mg total) by mouth daily. 30 tablet 1  . glucose blood (ACCU-CHEK GUIDE) test strip Use as instructed 4 x daily 150 each 2  . insulin aspart protamine - aspart (NOVOLOG MIX 70/30 FLEXPEN) (70-30) 100 UNIT/ML FlexPen 60 units in the a.m. And 40 units at night 30 mL 2  . Insulin Pen Needle (B-D ULTRAFINE III SHORT PEN) 31G X 8 MM MISC 1 each by Does not apply route as directed. 100 each 3  . isosorbide mononitrate  (IMDUR) 60 MG 24 hr tablet TAKE (1) TABLET BY MOUTH ONCE DAILY. 28 tablet 11  . lisinopril (PRINIVIL,ZESTRIL) 20 MG tablet TAKE (1) TABLET BY MOUTH ONCE DAILY. 30 tablet 11  . loratadine (CLARITIN) 10 MG tablet Take 10 mg by mouth daily.    . meclizine (ANTIVERT) 25 MG tablet Take 25 mg by mouth 4 (four) times daily as needed for dizziness.    . metoprolol tartrate (LOPRESSOR) 25 MG tablet Take 1 tablet (25 mg total) by mouth 2 (two) times daily. 180 tablet 3  . nitroGLYCERIN (NITROSTAT) 0.4 MG SL tablet Place 1 tablet (0.4 mg total) under the tongue every 5 (five) minutes as needed for chest pain. 25 tablet 3  . ONETOUCH DELICA LANCETS 73Z MISC USE AS DIRECTED THREE TIMES DAILY. 100 each 5  . polyethylene glycol (MIRALAX / GLYCOLAX) packet Take 17 g by mouth daily as needed for mild constipation. 30 each 0  . RANEXA 500 MG 12 hr tablet TAKE (2) TABLETS BY MOUTH TWICE DAILY. 64 tablet 11  . rivaroxaban (XARELTO) 2.5 MG TABS tablet Take 1 tablet (2.5 mg total) by mouth 2 (two) times daily. 60 tablet 6  . rosuvastatin (CRESTOR) 20 MG tablet Take 1 tablet (20 mg total) by mouth every evening. 30 tablet 1  . sennosides-docusate sodium (SENOKOT-S) 8.6-50 MG tablet Take 1 tablet by mouth daily as needed for constipation.     . traMADol (ULTRAM) 50 MG tablet Take 1 tablet (50 mg total) by mouth every 6 (six) hours as needed for moderate pain. 4 tablet 0   No current facility-administered medications on file prior to visit.     Allergies:   Allergies  Allergen Reactions  . Morphine Shortness Of Breath and Swelling  . Penicillins Shortness Of Breath and Swelling    Has patient had a PCN reaction causing immediate rash, facial/tongue/throat swelling, SOB or lightheadedness with hypotension: No Has patient had a PCN  reaction causing severe rash involving mucus membranes or skin necrosis: No Has patient had a PCN reaction that required hospitalization Yes Has patient had a PCN reaction occurring within  the last 10 years: No If all of the above answers are "NO", then may proceed with Cephalosporin use.   . Shellfish Allergy      Physical Exam  Vitals:   05/29/18 1319  BP: (!) 137/92  Pulse: 72  Height: 5\' 7"  (1.702 m)   Body mass index is 26.55 kg/m. No exam data present  General: Frail pleasant African-American female, seated, in no evident distress Head: head normocephalic and atraumatic.   Neck: supple with no carotid or supraclavicular bruits Cardiovascular: regular rate and rhythm, no murmurs Musculoskeletal: no deformity Skin:  no rash/petichiae Vascular:  Normal pulses all extremities  Neurologic Exam Mental Status: Awake and fully alert. Oriented to place and time. Recent and remote memory intact. Attention span, concentration and fund of knowledge appropriate. Mood and affect appropriate.  Cranial Nerves: Fundoscopic exam reveals sharp disc margins. Pupils equal, briskly reactive to light. Extraocular movements full without nystagmus. Visual fields full to confrontation. Hearing intact. Facial sensation intact. Face, tongue, palate moves normally and symmetrically.  Motor: Normal bulk and tone. Normal strength in all tested extremity muscles. Sensory.:  Decreased sensation left upper and lower extremity compared to right upper and lower extremity Coordination: Rapid alternating movements normal in all extremities. Finger-to-nose and heel-to-shin performed accurately bilaterally. Gait and Station: Arises from chair without difficulty. Stance is normal. Gait demonstrates normal stride length and balance . Able to heel, toe and tandem walk without difficulty.  Reflexes: 1+ and symmetric. Toes downgoing.    NIHSS  1 Modified Rankin  3   Diagnostic Data (Labs, Imaging, Testing)  CT HEAD WO CONTRAST 04/19/18 IMPRESSION: 1. No acute findings. No intracranial mass, hemorrhage or edema. No skull fracture. 2. Chronic small vessel ischemic changes throughout the  white Matter.  MR MRA HEAD WO CONTRAST MR BRAIN WO CONTRAST 04/25/18 IMPRESSION: 1. Small linear acute lacunar infarct in the white matter of the posterior left temporal lobe. No associated hemorrhage or mass effect. 2. Small subacute appearing white matter lacune in the contralateral right hemisphere. Burtis Junes this reflects synchronous small vessel disease (see #3). 3. Confluent bilateral cerebral white matter T2 and FLAIR hyperintensity is nonspecific but most commonly due to chronic small vessel disease. 4. Normal for age intracranial MRA.      ASSESSMENT: Krystal [name] is a 82 y.o. year old female here with left lacunar infarct on 04/25/2018 secondary to small vessel disease. Vascular risk factors include HTN and HLD.     PLAN: -Continue aspirin 81 mg daily and Xarelto (rivaroxaban) daily  and Lipitor for secondary stroke prevention -Start gabapentin 300mg  qHS (CrCl 20) for nerve pain -F/u with PCP regarding your HLD and HTN management -continue to monitor BP at home -Continue PT/OT/ST at current facility -Maintain strict control of hypertension with blood pressure goal below 130/90, diabetes with hemoglobin A1c goal below 6.5% and cholesterol with LDL cholesterol (bad cholesterol) goal below 70 mg/dL. I also advised the patient to eat a healthy diet with plenty of whole grains, cereals, fruits and vegetables, exercise regularly and maintain ideal body weight.  Follow up in 3 months or call earlier if needed   Greater than 50% of time during this 25 minute visit was spent on counseling,explanation of diagnosis of left lacunar infarct, reviewing risk factor management of HLD and HTN, planning of further management, discussion  with patient and family and coordination of care    Venancio Poisson, [facility]  [facility] [address] [place], [place] [address]  Phone [phone] Fax [phone]

## 2018-06-04 DIAGNOSIS — R251 Tremor, unspecified: Secondary | ICD-10-CM | POA: Diagnosis not present

## 2018-06-04 DIAGNOSIS — I700 Atherosclerosis of aorta: Secondary | ICD-10-CM | POA: Diagnosis not present

## 2018-06-04 DIAGNOSIS — R079 Chest pain, unspecified: Secondary | ICD-10-CM | POA: Diagnosis not present

## 2018-06-04 DIAGNOSIS — R296 Repeated falls: Secondary | ICD-10-CM | POA: Diagnosis not present

## 2018-06-04 DIAGNOSIS — J449 Chronic obstructive pulmonary disease, unspecified: Secondary | ICD-10-CM | POA: Diagnosis not present

## 2018-06-04 DIAGNOSIS — N390 Urinary tract infection, site not specified: Secondary | ICD-10-CM | POA: Diagnosis not present

## 2018-06-07 DIAGNOSIS — K210 Gastro-esophageal reflux disease with esophagitis: Secondary | ICD-10-CM | POA: Diagnosis not present

## 2018-06-07 DIAGNOSIS — J441 Chronic obstructive pulmonary disease with (acute) exacerbation: Secondary | ICD-10-CM | POA: Diagnosis not present

## 2018-06-07 DIAGNOSIS — I10 Essential (primary) hypertension: Secondary | ICD-10-CM | POA: Diagnosis not present

## 2018-06-07 DIAGNOSIS — N184 Chronic kidney disease, stage 4 (severe): Secondary | ICD-10-CM | POA: Diagnosis not present

## 2018-06-07 DIAGNOSIS — I5032 Chronic diastolic (congestive) heart failure: Secondary | ICD-10-CM | POA: Diagnosis not present

## 2018-06-07 DIAGNOSIS — R55 Syncope and collapse: Secondary | ICD-10-CM | POA: Diagnosis not present

## 2018-06-12 DIAGNOSIS — I672 Cerebral atherosclerosis: Secondary | ICD-10-CM | POA: Diagnosis not present

## 2018-06-12 DIAGNOSIS — G459 Transient cerebral ischemic attack, unspecified: Secondary | ICD-10-CM | POA: Diagnosis not present

## 2018-06-12 DIAGNOSIS — R250 Abnormal head movements: Secondary | ICD-10-CM | POA: Diagnosis not present

## 2018-06-12 DIAGNOSIS — G319 Degenerative disease of nervous system, unspecified: Secondary | ICD-10-CM | POA: Diagnosis not present

## 2018-06-12 DIAGNOSIS — R296 Repeated falls: Secondary | ICD-10-CM | POA: Diagnosis not present

## 2018-07-02 DIAGNOSIS — N189 Chronic kidney disease, unspecified: Secondary | ICD-10-CM | POA: Diagnosis not present

## 2018-07-02 DIAGNOSIS — I130 Hypertensive heart and chronic kidney disease with heart failure and stage 1 through stage 4 chronic kidney disease, or unspecified chronic kidney disease: Secondary | ICD-10-CM | POA: Diagnosis not present

## 2018-07-02 DIAGNOSIS — I110 Hypertensive heart disease with heart failure: Secondary | ICD-10-CM | POA: Diagnosis not present

## 2018-07-02 DIAGNOSIS — Z87891 Personal history of nicotine dependence: Secondary | ICD-10-CM | POA: Diagnosis not present

## 2018-07-02 DIAGNOSIS — J449 Chronic obstructive pulmonary disease, unspecified: Secondary | ICD-10-CM | POA: Diagnosis not present

## 2018-07-02 DIAGNOSIS — R0602 Shortness of breath: Secondary | ICD-10-CM | POA: Diagnosis not present

## 2018-07-02 DIAGNOSIS — I509 Heart failure, unspecified: Secondary | ICD-10-CM | POA: Diagnosis not present

## 2018-07-10 DIAGNOSIS — Z7982 Long term (current) use of aspirin: Secondary | ICD-10-CM | POA: Diagnosis not present

## 2018-07-10 DIAGNOSIS — I10 Essential (primary) hypertension: Secondary | ICD-10-CM | POA: Diagnosis not present

## 2018-07-10 DIAGNOSIS — R51 Headache: Secondary | ICD-10-CM | POA: Diagnosis not present

## 2018-07-10 DIAGNOSIS — N189 Chronic kidney disease, unspecified: Secondary | ICD-10-CM | POA: Diagnosis not present

## 2018-07-10 DIAGNOSIS — I509 Heart failure, unspecified: Secondary | ICD-10-CM | POA: Diagnosis not present

## 2018-07-10 DIAGNOSIS — Z87891 Personal history of nicotine dependence: Secondary | ICD-10-CM | POA: Diagnosis not present

## 2018-07-10 DIAGNOSIS — I130 Hypertensive heart and chronic kidney disease with heart failure and stage 1 through stage 4 chronic kidney disease, or unspecified chronic kidney disease: Secondary | ICD-10-CM | POA: Diagnosis not present

## 2018-07-10 DIAGNOSIS — Z79899 Other long term (current) drug therapy: Secondary | ICD-10-CM | POA: Diagnosis not present

## 2018-07-14 DIAGNOSIS — D649 Anemia, unspecified: Secondary | ICD-10-CM | POA: Diagnosis not present

## 2018-07-15 DIAGNOSIS — D649 Anemia, unspecified: Secondary | ICD-10-CM | POA: Diagnosis not present

## 2018-07-25 DIAGNOSIS — N184 Chronic kidney disease, stage 4 (severe): Secondary | ICD-10-CM | POA: Diagnosis not present

## 2018-07-25 DIAGNOSIS — K210 Gastro-esophageal reflux disease with esophagitis: Secondary | ICD-10-CM | POA: Diagnosis not present

## 2018-07-25 DIAGNOSIS — I10 Essential (primary) hypertension: Secondary | ICD-10-CM | POA: Diagnosis not present

## 2018-07-25 DIAGNOSIS — R55 Syncope and collapse: Secondary | ICD-10-CM | POA: Diagnosis not present

## 2018-07-25 DIAGNOSIS — I5032 Chronic diastolic (congestive) heart failure: Secondary | ICD-10-CM | POA: Diagnosis not present

## 2018-07-25 DIAGNOSIS — J441 Chronic obstructive pulmonary disease with (acute) exacerbation: Secondary | ICD-10-CM | POA: Diagnosis not present

## 2018-07-28 NOTE — Progress Notes (Deleted)
Cardiology Office Note    Date:  07/28/2018   ID:  Krystal [name], DOB 23-Jun-1936, MRN [id number]  PCP:  Iona Beard, MD  Cardiologist: Kate Sable, MD    No chief complaint on file.   History of Present Illness:    Krystal [name] is a 82 y.o. female with past medical history of CAD (s/p prior stenting of RCA and LAD), chronic diastolic CHF, COPD (on 2L [place] at baseline), HTN, HLD, and IDDM who presents to the office today for 80-month follow-up.   She was last examined by Dr. Bronson Ing in 03/2018 and denied any recurrent chest pain since having been started on Lopressor at her last office visit. She was continued on ASA along with Xarelto 2.5mg  BID, Zetia, Imdur, Ranexa, and Crestor.  In the interim, she was admitted to [facility] in 04/2018 for a left temporal ischemic CVA. Echocardiogram was performed during admission and showed a preserved EF of 60-65% with Grade 1 DD and no cardiac source of embolus. Her event was thought to be most consistent with a lacunar CVA, therefore a TEE and ILR placement were not pursued.    Past Medical History:  Diagnosis Date  . Allergic rhinitis   . CAD (coronary artery disease) 2009   5 stents- #3 in RCA, #1 each in LAD and AVG  . CHF (congestive heart failure) ([place])   . CKD (chronic kidney disease) stage 3, GFR 30-59 ml/min (HCC)   . Complete heart block, transient 2014  . Diastolic dysfunction, left ventricle   . DJD (degenerative joint disease)   . Emphysema lung (HCC)    2L N/C continuously  . Essential hypertension   . GERD (gastroesophageal reflux disease)   . Glaucoma   . Gout   . History of uterine cancer   . HOH (hard of hearing)   . Hyperlipidemia   . MI (myocardial infarction) ([place]) 1998  . Microcytic anemia    History of occult blood in stool  . Myalgia   . Osteoarthritis   . Palpitations   . Personal history of colonic polyps   . Sleep apnea    CPAP  . Stroke ([place])   . Type II diabetes mellitus with  nephropathy [facility])     Past Surgical History:  Procedure Laterality Date  . ABDOMINAL HYSTERECTOMY  04/2010   Uterine cancer,TAHBSO  . CERVICAL DISCECTOMY     L5 left/hemilminectomy  . COLONOSCOPY  09/2006   Int hemmorhoids, COMPLICATED BY CARDIOPULMONARY COMPLICATIONS  . CORONARY ANGIOPLASTY  1/99, 1/07, 1/08, 4/09   5 cardiac stents total  . ECTOPIC PREGNANCY SURGERY    . FOOT SURGERY     Left and right for callous  . LEFT AND RIGHT HEART CATHETERIZATION WITH CORONARY ANGIOGRAM N/A 09/12/2012   Procedure: LEFT AND RIGHT HEART CATHETERIZATION WITH CORONARY ANGIOGRAM;  Surgeon: Sanda Klein, MD;  Location: [facility] CATH [facility];  Service: Cardiovascular;  Laterality: N/A;  . POLYPECTOMY  10/22/2011   Internal hemorrhoids/sessile polyp    Current Medications: Outpatient Medications Prior to Visit  Medication Sig Dispense Refill  . ACCU-CHEK FASTCLIX LANCETS MISC USE AS DIRECTED TWICE DAILY. 102 each 11  . aspirin 81 MG EC tablet Take 81 mg by mouth every morning.     . BD PEN NEEDLE NANO U/F 32G X 4 MM MISC USE AS DIRECTED TWICE DAILY. 60 each 10  . budesonide-formoterol (SYMBICORT) 160-4.5 MCG/ACT inhaler Inhale 2 puffs into the lungs 2 (two) times daily.    . calcitRIOL (  ROCALTROL) 0.25 MCG capsule TAKE 1 CAPSULE BY MOUTH ONCE A DAY. 30 capsule 11  . Continuous Blood Gluc Sensor (FREESTYLE LIBRE SENSOR SYSTEM) MISC Use one sensor every 10 days. 3 each 2  . ergocalciferol (VITAMIN D2) 50000 units capsule Take 50,000 Units by mouth once a week.    . ezetimibe (ZETIA) 10 MG tablet TAKE 1 TABLET BY MOUTH EVERY MORNING. 30 tablet 6  . famotidine (PEPCID) 20 MG tablet Take 1 tablet (20 mg total) by mouth daily. 30 tablet 1  . gabapentin (NEURONTIN) 300 MG capsule Take 1 capsule (300 mg total) by mouth at bedtime. 90 capsule 3  . glucose blood (ACCU-CHEK GUIDE) test strip Use as instructed 4 x daily 150 each 2  . insulin aspart protamine - aspart (NOVOLOG MIX 70/30 FLEXPEN) (70-30) 100 UNIT/ML  FlexPen 60 units in the a.m. And 40 units at night 30 mL 2  . Insulin Pen Needle (B-D ULTRAFINE III SHORT PEN) 31G X 8 MM MISC 1 each by Does not apply route as directed. 100 each 3  . isosorbide mononitrate (IMDUR) 60 MG 24 hr tablet TAKE (1) TABLET BY MOUTH ONCE DAILY. 28 tablet 11  . lisinopril (PRINIVIL,ZESTRIL) 20 MG tablet TAKE (1) TABLET BY MOUTH ONCE DAILY. 30 tablet 11  . loratadine (CLARITIN) 10 MG tablet Take 10 mg by mouth daily.    . meclizine (ANTIVERT) 25 MG tablet Take 25 mg by mouth 4 (four) times daily as needed for dizziness.    . metoprolol tartrate (LOPRESSOR) 25 MG tablet Take 1 tablet (25 mg total) by mouth 2 (two) times daily. 180 tablet 3  . nitroGLYCERIN (NITROSTAT) 0.4 MG SL tablet Place 1 tablet (0.4 mg total) under the tongue every 5 (five) minutes as needed for chest pain. 25 tablet 3  . ONETOUCH DELICA LANCETS 59R MISC USE AS DIRECTED THREE TIMES DAILY. 100 each 5  . polyethylene glycol (MIRALAX / GLYCOLAX) packet Take 17 g by mouth daily as needed for mild constipation. 30 each 0  . RANEXA 500 MG 12 hr tablet TAKE (2) TABLETS BY MOUTH TWICE DAILY. 64 tablet 11  . rivaroxaban (XARELTO) 2.5 MG TABS tablet Take 1 tablet (2.5 mg total) by mouth 2 (two) times daily. 60 tablet 6  . rosuvastatin (CRESTOR) 20 MG tablet Take 1 tablet (20 mg total) by mouth every evening. 30 tablet 1  . sennosides-docusate sodium (SENOKOT-S) 8.6-50 MG tablet Take 1 tablet by mouth daily as needed for constipation.     . traMADol (ULTRAM) 50 MG tablet Take 1 tablet (50 mg total) by mouth every 6 (six) hours as needed for moderate pain. 4 tablet 0   No facility-administered medications prior to visit.      Allergies:   Morphine; Penicillins; and Shellfish allergy   Social History   Socioeconomic History  . Marital status: Widowed    Spouse name: Not on file  . Number of children: 0  . Years of education: 53  . Highest education level: Not on file  Occupational History    Comment:  retired  Scientific laboratory technician  . Financial resource strain: Not on file  . Food insecurity:    Worry: Not on file    Inability: Not on file  . Transportation needs:    Medical: Not on file    Non-medical: Not on file  Tobacco Use  . Smoking status: Former Smoker    Packs/day: 0.40    Years: 51.00    Pack years: 20.40  Types: Cigarettes    Start date: 04/22/1955    Last attempt to quit: 10/18/2006    Years since quitting: 11.7  . Smokeless tobacco: Never Used  . Tobacco comment: quit about 4 yrs ago  Substance and Sexual Activity  . Alcohol use: No    Alcohol/week: 0.0 standard drinks  . Drug use: No  . Sexual activity: Never    Birth control/protection: Surgical  Lifestyle  . Physical activity:    Days per week: Not on file    Minutes per session: Not on file  . Stress: Not on file  Relationships  . Social connections:    Talks on phone: Not on file    Gets together: Not on file    Attends religious service: Not on file    Active member of club or organization: Not on file    Attends meetings of clubs or organizations: Not on file    Relationship status: Not on file  Other Topics Concern  . Not on file  Social History Narrative   Consumes 2 cups of caffeine daily     Family History:  The patient's ***family history is not on file.   Review of Systems:   Please see the history of present illness.     General:  No chills, fever, night sweats or weight changes.  Cardiovascular:  No chest pain, dyspnea on exertion, edema, orthopnea, palpitations, paroxysmal nocturnal dyspnea. Dermatological: No rash, lesions/masses Respiratory: No cough, dyspnea Urologic: No hematuria, dysuria Abdominal:   No nausea, vomiting, diarrhea, bright red blood per rectum, melena, or hematemesis Neurologic:  No visual changes, wkns, changes in mental status. All other systems reviewed and are otherwise negative except as noted above.   Physical Exam:    VS:  There were no vitals taken for  this visit.   General: Well developed, well nourished,female appearing in no acute distress. Head: Normocephalic, atraumatic, sclera non-icteric, no xanthomas, nares are without discharge.  Neck: No carotid bruits. JVD not elevated.  Lungs: Respirations regular and unlabored, without wheezes or rales.  Heart: ***Regular rate and rhythm. No S3 or S4.  No murmur, no rubs, or gallops appreciated. Abdomen: Soft, non-tender, non-distended with normoactive bowel sounds. No hepatomegaly. No rebound/guarding. No obvious abdominal masses. Msk:  Strength and tone appear normal for age. No joint deformities or effusions. Extremities: No clubbing or cyanosis. No edema.  Distal pedal pulses are 2+ bilaterally. Neuro: Alert and oriented X 3. Moves all extremities spontaneously. No focal deficits noted. Psych:  Responds to questions appropriately with a normal affect. Skin: No rashes or lesions noted  Wt Readings from Last 3 Encounters:  04/25/18 169 lb 8.5 oz (76.9 kg)  04/19/18 171 lb (77.6 kg)  04/11/18 171 lb (77.6 kg)        Studies/Labs Reviewed:   EKG:  EKG is*** ordered today.  The ekg ordered today demonstrates ***  Recent Labs: 04/19/2018: TSH 1.783 04/25/2018: ALT 20; B Natriuretic Peptide 739.0; Hemoglobin 10.0; Platelets 225 04/27/2018: BUN 26; Creatinine, Ser 2.80; Potassium 4.0; Sodium 135   Lipid Panel    Component Value Date/Time   CHOL 132 04/26/2018 0537   TRIG 105 04/26/2018 0537   HDL 30 (L) 04/26/2018 0537   CHOLHDL 4.4 04/26/2018 0537   VLDL 21 04/26/2018 0537   LDLCALC 81 04/26/2018 0537   LDLDIRECT 118.4 08/13/2013 1502    Additional studies/ records that were reviewed today include:   Echocardiogram: 04/26/2018 Study Conclusions  - Left ventricle: The cavity size was  normal. There was moderate   concentric hypertrophy. Systolic function was normal. The   estimated ejection fraction was in the range of 60% to 65%. Wall   motion was normal; there were no  regional wall motion   abnormalities. Doppler parameters are consistent with abnormal   left ventricular relaxation (grade 1 diastolic dysfunction).   Doppler parameters are consistent with elevated mean left atrial   filling pressure.  Assessment:    No diagnosis found.   Plan:   In order of problems listed above:  1. CAD - s/p prior stenting of RCA and LAD with patent stents by catheterization in 2013.  2. Chronic Diastolic CHF - ***  3. HTN - ***  4. HLD - FLP in 04/2018 showed total cholesterol of 132, HDL 30, and LDL 81. Has remained on Crestor 20mg  daily and Zetia 10mg  daily.   5. COPD - on 2L [place] at baseline  6. Recent CVA - ***   Medication Adjustments/Labs and Tests Ordered: Current medicines are reviewed at length with the patient today.  Concerns regarding medicines are outlined above.  Medication changes, Labs and Tests ordered today are listed in the Patient Instructions below. There are no Patient Instructions on file for this visit.   Signed, Erma Heritage, PA-C  07/28/2018 8:26 AM    [place] [address] [place], [place] [address] Phone: [phone]

## 2018-07-29 ENCOUNTER — Ambulatory Visit: Payer: Medicare Other | Admitting: Student

## 2018-07-30 ENCOUNTER — Encounter: Payer: Self-pay | Admitting: Student

## 2018-08-19 DIAGNOSIS — I69898 Other sequelae of other cerebrovascular disease: Secondary | ICD-10-CM | POA: Diagnosis not present

## 2018-08-19 DIAGNOSIS — J9611 Chronic respiratory failure with hypoxia: Secondary | ICD-10-CM | POA: Diagnosis not present

## 2018-08-19 DIAGNOSIS — M6281 Muscle weakness (generalized): Secondary | ICD-10-CM | POA: Diagnosis not present

## 2018-08-19 DIAGNOSIS — Z23 Encounter for immunization: Secondary | ICD-10-CM | POA: Diagnosis not present

## 2018-08-25 DIAGNOSIS — M6281 Muscle weakness (generalized): Secondary | ICD-10-CM | POA: Diagnosis not present

## 2018-08-25 DIAGNOSIS — I69898 Other sequelae of other cerebrovascular disease: Secondary | ICD-10-CM | POA: Diagnosis not present

## 2018-08-25 DIAGNOSIS — J9611 Chronic respiratory failure with hypoxia: Secondary | ICD-10-CM | POA: Diagnosis not present

## 2018-08-26 DIAGNOSIS — J9611 Chronic respiratory failure with hypoxia: Secondary | ICD-10-CM | POA: Diagnosis not present

## 2018-08-26 DIAGNOSIS — M6281 Muscle weakness (generalized): Secondary | ICD-10-CM | POA: Diagnosis not present

## 2018-08-26 DIAGNOSIS — I69898 Other sequelae of other cerebrovascular disease: Secondary | ICD-10-CM | POA: Diagnosis not present

## 2018-08-27 DIAGNOSIS — M6281 Muscle weakness (generalized): Secondary | ICD-10-CM | POA: Diagnosis not present

## 2018-08-27 DIAGNOSIS — I69898 Other sequelae of other cerebrovascular disease: Secondary | ICD-10-CM | POA: Diagnosis not present

## 2018-08-27 DIAGNOSIS — J9611 Chronic respiratory failure with hypoxia: Secondary | ICD-10-CM | POA: Diagnosis not present

## 2018-08-28 DIAGNOSIS — M6281 Muscle weakness (generalized): Secondary | ICD-10-CM | POA: Diagnosis not present

## 2018-08-28 DIAGNOSIS — I69898 Other sequelae of other cerebrovascular disease: Secondary | ICD-10-CM | POA: Diagnosis not present

## 2018-08-28 DIAGNOSIS — J9611 Chronic respiratory failure with hypoxia: Secondary | ICD-10-CM | POA: Diagnosis not present

## 2018-08-29 ENCOUNTER — Ambulatory Visit: Payer: Medicare Other | Admitting: Adult Health

## 2018-08-29 DIAGNOSIS — J9611 Chronic respiratory failure with hypoxia: Secondary | ICD-10-CM | POA: Diagnosis not present

## 2018-08-29 DIAGNOSIS — I69898 Other sequelae of other cerebrovascular disease: Secondary | ICD-10-CM | POA: Diagnosis not present

## 2018-08-29 DIAGNOSIS — M6281 Muscle weakness (generalized): Secondary | ICD-10-CM | POA: Diagnosis not present

## 2018-08-29 NOTE — Progress Notes (Deleted)
[facility] [address] [place]. [place] [address] [phone]       OFFICE FOLLOW UP NOTE  [name] Date of Birth:  29-Aug-1936 Medical Record Number:  [id number]   Reason for Referral:  hospital stroke follow up  CHIEF COMPLAINT:  No chief complaint on file.   HPI: Krystal [name] is being seen today in the office for small acute lacunar infarct on 04/27/2018. History obtained from patient and chart review. Reviewed all radiology images and labs personally.  Krystal [name] is an 82 y.o. female with a past medical history of stage IV chronic kidney disease, uncontrolled diabetes mellitus-insulin-dependent, GERD, coronary artery disease on low-dose Xarelto, hypertension, chronic diastolic heart failure, dyslipidemia as well as a recent hospitalization, with discharge 04/21/18 with syncope and rhabdomyolysis. Patient came to the ED with complaints of 2 months of dizziness which she described as the room spinning with intermittent nausea as well as right lower extremity weakness. Blood glucose on admission at 37.On admission, patient's creatinine was at baseline, chest x-ray was within normal limits, MRI of the brain showed a small linear acute lacunar infarct in the left temporal lobe as well as a subacute lacunar infarct in the right hemisphere as well as a white matterlesionsdue to chronic small vessel disease.  MRA head was normal.  Carotid Doppler showed no significant extracranial stenosis bilaterally.  2D echo showed an EF of 60 to 65% without cardiac source of embolus.  LDL 81 and recommended for patient to continue Crestor 20 mg daily along with Zetia 10 mg daily.  A1c elevated at 10.9 recommended tight glycemic control with close PCP follow-up.  Patient was on Xarelto for CAD and aspirin 81 mg daily PTA and this was recommended to continue at discharge.  Therapies recommended SNF for continued therapies.  05/29/2018 visit: Patient is being seen today  for hospital follow-up and is still continue to complain of dizziness while standing but this has been improving.  She is currently residing at [facility] where she continues to receive PT/OT/ST.  Rehab and nursing care center she continues to take her also on aspirin without side effects of bleeding or bruising.  Continues to take Crestor and Zetia without side effects myalgias.  Blood pressure today satisfactory 137/92.  She also has complaints of discrete sensation on her left side where she does feel a burning sensation along with radiating electrical shocks.  Denies new or worsening stroke/TIA symptoms at this time.  Interval history 08/29/2018:   ROS:   14 system review of systems performed and negative with exception of swelling in legs, spinning sensation, rash, constipation, allergies, confusion, headache, dizziness, passing out, change in appetite, and snoring  PMH:  Past Medical History:  Diagnosis Date  . Allergic rhinitis   . CAD (coronary artery disease) 2009   5 stents- #3 in RCA, #1 each in LAD and AVG  . CHF (congestive heart failure) ([place])   . CKD (chronic kidney disease) stage 3, GFR 30-59 ml/min (HCC)   . Complete heart block, transient 2014  . Diastolic dysfunction, left ventricle   . DJD (degenerative joint disease)   . Emphysema lung (HCC)    2L N/C continuously  . Essential hypertension   . GERD (gastroesophageal reflux disease)   . Glaucoma   . Gout   . History of uterine cancer   . HOH (hard of hearing)   . Hyperlipidemia   . MI (myocardial infarction) ([place]) 1998  . Microcytic anemia  History of occult blood in stool  . Myalgia   . Osteoarthritis   . Palpitations   . Personal history of colonic polyps   . Sleep apnea    CPAP  . Stroke ([place])   . Type II diabetes mellitus with nephropathy (HCC)     PSH:  Past Surgical History:  Procedure Laterality Date  . ABDOMINAL HYSTERECTOMY  04/2010   Uterine cancer,TAHBSO  . CERVICAL DISCECTOMY     L5  left/hemilminectomy  . COLONOSCOPY  09/2006   Int hemmorhoids, COMPLICATED BY CARDIOPULMONARY COMPLICATIONS  . CORONARY ANGIOPLASTY  1/99, 1/07, 1/08, 4/09   5 cardiac stents total  . ECTOPIC PREGNANCY SURGERY    . FOOT SURGERY     Left and right for callous  . LEFT AND RIGHT HEART CATHETERIZATION WITH CORONARY ANGIOGRAM N/A 09/12/2012   Procedure: LEFT AND RIGHT HEART CATHETERIZATION WITH CORONARY ANGIOGRAM;  Surgeon: Sanda Klein, MD;  Location: [facility];  Service: Cardiovascular;  Laterality: N/A;  . POLYPECTOMY  10/22/2011   Internal hemorrhoids/sessile polyp    Social History:  Social History   Socioeconomic History  . Marital status: Widowed    Spouse name: Not on file  . Number of children: 0  . Years of education: 81  . Highest education level: Not on file  Occupational History    Comment: retired  Scientific laboratory technician  . Financial resource strain: Not on file  . Food insecurity:    Worry: Not on file    Inability: Not on file  . Transportation needs:    Medical: Not on file    Non-medical: Not on file  Tobacco Use  . Smoking status: Former Smoker    Packs/day: 0.40    Years: 51.00    Pack years: 20.40    Types: Cigarettes    Start date: 04/22/1955    Last attempt to quit: 10/18/2006    Years since quitting: 11.8  . Smokeless tobacco: Never Used  . Tobacco comment: quit about 4 yrs ago  Substance and Sexual Activity  . Alcohol use: No    Alcohol/week: 0.0 standard drinks  . Drug use: No  . Sexual activity: Never    Birth control/protection: Surgical  Lifestyle  . Physical activity:    Days per week: Not on file    Minutes per session: Not on file  . Stress: Not on file  Relationships  . Social connections:    Talks on phone: Not on file    Gets together: Not on file    Attends religious service: Not on file    Active member of club or organization: Not on file    Attends meetings of clubs or organizations: Not on file    Relationship status: Not on file   . Intimate partner violence:    Fear of current or ex partner: Not on file    Emotionally abused: Not on file    Physically abused: Not on file    Forced sexual activity: Not on file  Other Topics Concern  . Not on file  Social History Narrative   Consumes 2 cups of caffeine daily    Family History:  Family History  Problem Relation Age of Onset  . Anesthesia problems Neg Hx   . Hypotension Neg Hx   . Malignant hyperthermia Neg Hx   . Pseudochol deficiency Neg Hx     Medications:   Current Outpatient Medications on File Prior to Visit  Medication Sig Dispense Refill  . ACCU-CHEK FASTCLIX  LANCETS MISC USE AS DIRECTED TWICE DAILY. 102 each 11  . aspirin 81 MG EC tablet Take 81 mg by mouth every morning.     . BD PEN NEEDLE NANO U/F 32G X 4 MM MISC USE AS DIRECTED TWICE DAILY. 60 each 10  . budesonide-formoterol (SYMBICORT) 160-4.5 MCG/ACT inhaler Inhale 2 puffs into the lungs 2 (two) times daily.    . calcitRIOL (ROCALTROL) 0.25 MCG capsule TAKE 1 CAPSULE BY MOUTH ONCE A DAY. 30 capsule 11  . Continuous Blood Gluc Sensor (FREESTYLE LIBRE SENSOR SYSTEM) MISC Use one sensor every 10 days. 3 each 2  . ergocalciferol (VITAMIN D2) 50000 units capsule Take 50,000 Units by mouth once a week.    . ezetimibe (ZETIA) 10 MG tablet TAKE 1 TABLET BY MOUTH EVERY MORNING. 30 tablet 6  . famotidine (PEPCID) 20 MG tablet Take 1 tablet (20 mg total) by mouth daily. 30 tablet 1  . gabapentin (NEURONTIN) 300 MG capsule Take 1 capsule (300 mg total) by mouth at bedtime. 90 capsule 3  . glucose blood (ACCU-CHEK GUIDE) test strip Use as instructed 4 x daily 150 each 2  . insulin aspart protamine - aspart (NOVOLOG MIX 70/30 FLEXPEN) (70-30) 100 UNIT/ML FlexPen 60 units in the a.m. And 40 units at night 30 mL 2  . Insulin Pen Needle (B-D ULTRAFINE III SHORT PEN) 31G X 8 MM MISC 1 each by Does not apply route as directed. 100 each 3  . isosorbide mononitrate (IMDUR) 60 MG 24 hr tablet TAKE (1) TABLET BY  MOUTH ONCE DAILY. 28 tablet 11  . lisinopril (PRINIVIL,ZESTRIL) 20 MG tablet TAKE (1) TABLET BY MOUTH ONCE DAILY. 30 tablet 11  . loratadine (CLARITIN) 10 MG tablet Take 10 mg by mouth daily.    . meclizine (ANTIVERT) 25 MG tablet Take 25 mg by mouth 4 (four) times daily as needed for dizziness.    . metoprolol tartrate (LOPRESSOR) 25 MG tablet Take 1 tablet (25 mg total) by mouth 2 (two) times daily. 180 tablet 3  . nitroGLYCERIN (NITROSTAT) 0.4 MG SL tablet Place 1 tablet (0.4 mg total) under the tongue every 5 (five) minutes as needed for chest pain. 25 tablet 3  . ONETOUCH DELICA LANCETS 89Q MISC USE AS DIRECTED THREE TIMES DAILY. 100 each 5  . polyethylene glycol (MIRALAX / GLYCOLAX) packet Take 17 g by mouth daily as needed for mild constipation. 30 each 0  . RANEXA 500 MG 12 hr tablet TAKE (2) TABLETS BY MOUTH TWICE DAILY. 64 tablet 11  . rivaroxaban (XARELTO) 2.5 MG TABS tablet Take 1 tablet (2.5 mg total) by mouth 2 (two) times daily. 60 tablet 6  . rosuvastatin (CRESTOR) 20 MG tablet Take 1 tablet (20 mg total) by mouth every evening. 30 tablet 1  . sennosides-docusate sodium (SENOKOT-S) 8.6-50 MG tablet Take 1 tablet by mouth daily as needed for constipation.     . traMADol (ULTRAM) 50 MG tablet Take 1 tablet (50 mg total) by mouth every 6 (six) hours as needed for moderate pain. 4 tablet 0   No current facility-administered medications on file prior to visit.     Allergies:   Allergies  Allergen Reactions  . Morphine Shortness Of Breath and Swelling  . Penicillins Shortness Of Breath and Swelling    Has patient had a PCN reaction causing immediate rash, facial/tongue/throat swelling, SOB or lightheadedness with hypotension: No Has patient had a PCN reaction causing severe rash involving mucus membranes or skin necrosis: No Has patient  had a PCN reaction that required hospitalization Yes Has patient had a PCN reaction occurring within the last 10 years: No If all of the above  answers are "NO", then may proceed with Cephalosporin use.   . Shellfish Allergy      Physical Exam  There were no vitals filed for this visit. There is no height or weight on file to calculate BMI. No exam data present  General: Frail pleasant African-American female, seated, in no evident distress Head: head normocephalic and atraumatic.   Neck: supple with no carotid or supraclavicular bruits Cardiovascular: regular rate and rhythm, no murmurs Musculoskeletal: no deformity Skin:  no rash/petichiae Vascular:  Normal pulses all extremities  Neurologic Exam Mental Status: Awake and fully alert. Oriented to place and time. Recent and remote memory intact. Attention span, concentration and fund of knowledge appropriate. Mood and affect appropriate.  Cranial Nerves: Fundoscopic exam reveals sharp disc margins. Pupils equal, briskly reactive to light. Extraocular movements full without nystagmus. Visual fields full to confrontation. Hearing intact. Facial sensation intact. Face, tongue, palate moves normally and symmetrically.  Motor: Normal bulk and tone. Normal strength in all tested extremity muscles. Sensory.:  Decreased sensation left upper and lower extremity compared to right upper and lower extremity Coordination: Rapid alternating movements normal in all extremities. Finger-to-nose and heel-to-shin performed accurately bilaterally. Gait and Station: Arises from chair without difficulty. Stance is normal. Gait demonstrates normal stride length and balance . Able to heel, toe and tandem walk without difficulty.  Reflexes: 1+ and symmetric. Toes downgoing.    NIHSS  1 Modified Rankin  3   Diagnostic Data (Labs, Imaging, Testing)  CT HEAD WO CONTRAST 04/19/18 IMPRESSION: 1. No acute findings. No intracranial mass, hemorrhage or edema. No skull fracture. 2. Chronic small vessel ischemic changes throughout the white Matter.  MR MRA HEAD WO CONTRAST MR BRAIN WO  CONTRAST 04/25/18 IMPRESSION: 1. Small linear acute lacunar infarct in the white matter of the posterior left temporal lobe. No associated hemorrhage or mass effect. 2. Small subacute appearing white matter lacune in the contralateral right hemisphere. Burtis Junes this reflects synchronous small vessel disease (see #3). 3. Confluent bilateral cerebral white matter T2 and FLAIR hyperintensity is nonspecific but most commonly due to chronic small vessel disease. 4. Normal for age intracranial MRA.      ASSESSMENT: FELICIDAD SUGARMAN is a 82 y.o. year old female here with left lacunar infarct on 04/25/2018 secondary to small vessel disease. Vascular risk factors include HTN and HLD.     PLAN: -Continue aspirin 81 mg daily and Xarelto (rivaroxaban) daily  and Lipitor for secondary stroke prevention -Start gabapentin 300mg  qHS (CrCl 20) for nerve pain -F/u with PCP regarding your HLD and HTN management -continue to monitor BP at home -Continue PT/OT/ST at current facility -Maintain strict control of hypertension with blood pressure goal below 130/90, diabetes with hemoglobin A1c goal below 6.5% and cholesterol with LDL cholesterol (bad cholesterol) goal below 70 mg/dL. I also advised the patient to eat a healthy diet with plenty of whole grains, cereals, fruits and vegetables, exercise regularly and maintain ideal body weight.  Follow up in 3 months or call earlier if needed   Greater than 50% of time during this 25 minute visit was spent on counseling,explanation of diagnosis of left lacunar infarct, reviewing risk factor management of HLD and HTN, planning of further management, discussion with patient and family and coordination of care    Venancio Poisson, AGNP-BC  [facility] [phone] [address] [place], [place] [address]  Phone [phone] Fax [phone]

## 2018-09-01 ENCOUNTER — Encounter: Payer: Self-pay | Admitting: Adult Health

## 2018-09-01 ENCOUNTER — Telehealth: Payer: Self-pay

## 2018-09-01 DIAGNOSIS — M6281 Muscle weakness (generalized): Secondary | ICD-10-CM | POA: Diagnosis not present

## 2018-09-01 DIAGNOSIS — J9611 Chronic respiratory failure with hypoxia: Secondary | ICD-10-CM | POA: Diagnosis not present

## 2018-09-01 DIAGNOSIS — I69898 Other sequelae of other cerebrovascular disease: Secondary | ICD-10-CM | POA: Diagnosis not present

## 2018-09-01 NOTE — Telephone Encounter (Signed)
Patient no show for appt on 08/29/2018.

## 2018-09-02 DIAGNOSIS — J9611 Chronic respiratory failure with hypoxia: Secondary | ICD-10-CM | POA: Diagnosis not present

## 2018-09-02 DIAGNOSIS — M6281 Muscle weakness (generalized): Secondary | ICD-10-CM | POA: Diagnosis not present

## 2018-09-02 DIAGNOSIS — I69898 Other sequelae of other cerebrovascular disease: Secondary | ICD-10-CM | POA: Diagnosis not present

## 2018-09-03 DIAGNOSIS — I69898 Other sequelae of other cerebrovascular disease: Secondary | ICD-10-CM | POA: Diagnosis not present

## 2018-09-03 DIAGNOSIS — M6281 Muscle weakness (generalized): Secondary | ICD-10-CM | POA: Diagnosis not present

## 2018-09-03 DIAGNOSIS — J9611 Chronic respiratory failure with hypoxia: Secondary | ICD-10-CM | POA: Diagnosis not present

## 2018-09-04 DIAGNOSIS — M6281 Muscle weakness (generalized): Secondary | ICD-10-CM | POA: Diagnosis not present

## 2018-09-04 DIAGNOSIS — J9611 Chronic respiratory failure with hypoxia: Secondary | ICD-10-CM | POA: Diagnosis not present

## 2018-09-04 DIAGNOSIS — I69898 Other sequelae of other cerebrovascular disease: Secondary | ICD-10-CM | POA: Diagnosis not present

## 2018-09-05 DIAGNOSIS — J9611 Chronic respiratory failure with hypoxia: Secondary | ICD-10-CM | POA: Diagnosis not present

## 2018-09-05 DIAGNOSIS — M6281 Muscle weakness (generalized): Secondary | ICD-10-CM | POA: Diagnosis not present

## 2018-09-05 DIAGNOSIS — I69898 Other sequelae of other cerebrovascular disease: Secondary | ICD-10-CM | POA: Diagnosis not present

## 2018-09-07 DIAGNOSIS — Z79899 Other long term (current) drug therapy: Secondary | ICD-10-CM | POA: Diagnosis not present

## 2018-09-07 DIAGNOSIS — Z794 Long term (current) use of insulin: Secondary | ICD-10-CM | POA: Diagnosis not present

## 2018-09-07 DIAGNOSIS — Z7982 Long term (current) use of aspirin: Secondary | ICD-10-CM | POA: Diagnosis not present

## 2018-09-07 DIAGNOSIS — Z7901 Long term (current) use of anticoagulants: Secondary | ICD-10-CM | POA: Diagnosis not present

## 2018-09-07 DIAGNOSIS — Z5181 Encounter for therapeutic drug level monitoring: Secondary | ICD-10-CM | POA: Diagnosis not present

## 2018-09-08 DIAGNOSIS — M6281 Muscle weakness (generalized): Secondary | ICD-10-CM | POA: Diagnosis not present

## 2018-09-08 DIAGNOSIS — J9611 Chronic respiratory failure with hypoxia: Secondary | ICD-10-CM | POA: Diagnosis not present

## 2018-09-08 DIAGNOSIS — I69898 Other sequelae of other cerebrovascular disease: Secondary | ICD-10-CM | POA: Diagnosis not present

## 2018-09-09 DIAGNOSIS — J9611 Chronic respiratory failure with hypoxia: Secondary | ICD-10-CM | POA: Diagnosis not present

## 2018-09-09 DIAGNOSIS — I5032 Chronic diastolic (congestive) heart failure: Secondary | ICD-10-CM | POA: Diagnosis not present

## 2018-09-09 DIAGNOSIS — K210 Gastro-esophageal reflux disease with esophagitis: Secondary | ICD-10-CM | POA: Diagnosis not present

## 2018-09-09 DIAGNOSIS — I69898 Other sequelae of other cerebrovascular disease: Secondary | ICD-10-CM | POA: Diagnosis not present

## 2018-09-09 DIAGNOSIS — M6281 Muscle weakness (generalized): Secondary | ICD-10-CM | POA: Diagnosis not present

## 2018-09-09 DIAGNOSIS — N184 Chronic kidney disease, stage 4 (severe): Secondary | ICD-10-CM | POA: Diagnosis not present

## 2018-09-09 DIAGNOSIS — I10 Essential (primary) hypertension: Secondary | ICD-10-CM | POA: Diagnosis not present

## 2018-09-10 DIAGNOSIS — M6281 Muscle weakness (generalized): Secondary | ICD-10-CM | POA: Diagnosis not present

## 2018-09-10 DIAGNOSIS — I509 Heart failure, unspecified: Secondary | ICD-10-CM | POA: Diagnosis not present

## 2018-09-10 DIAGNOSIS — J9611 Chronic respiratory failure with hypoxia: Secondary | ICD-10-CM | POA: Diagnosis not present

## 2018-09-10 DIAGNOSIS — D649 Anemia, unspecified: Secondary | ICD-10-CM | POA: Diagnosis not present

## 2018-09-10 DIAGNOSIS — I69898 Other sequelae of other cerebrovascular disease: Secondary | ICD-10-CM | POA: Diagnosis not present

## 2018-09-10 NOTE — Telephone Encounter (Signed)
Patient's cousin Letta Median (on [place]) says patient missed appointment on 09-01-18 because she is now in a nursing home. She will call back if a future appointment is needed.

## 2018-09-11 DIAGNOSIS — I69898 Other sequelae of other cerebrovascular disease: Secondary | ICD-10-CM | POA: Diagnosis not present

## 2018-09-11 DIAGNOSIS — J9611 Chronic respiratory failure with hypoxia: Secondary | ICD-10-CM | POA: Diagnosis not present

## 2018-09-11 DIAGNOSIS — M6281 Muscle weakness (generalized): Secondary | ICD-10-CM | POA: Diagnosis not present

## 2018-09-12 DIAGNOSIS — I69898 Other sequelae of other cerebrovascular disease: Secondary | ICD-10-CM | POA: Diagnosis not present

## 2018-09-12 DIAGNOSIS — J9611 Chronic respiratory failure with hypoxia: Secondary | ICD-10-CM | POA: Diagnosis not present

## 2018-09-12 DIAGNOSIS — M6281 Muscle weakness (generalized): Secondary | ICD-10-CM | POA: Diagnosis not present

## 2018-09-15 DIAGNOSIS — I69898 Other sequelae of other cerebrovascular disease: Secondary | ICD-10-CM | POA: Diagnosis not present

## 2018-09-15 DIAGNOSIS — J9611 Chronic respiratory failure with hypoxia: Secondary | ICD-10-CM | POA: Diagnosis not present

## 2018-09-15 DIAGNOSIS — M6281 Muscle weakness (generalized): Secondary | ICD-10-CM | POA: Diagnosis not present

## 2018-09-16 DIAGNOSIS — J9611 Chronic respiratory failure with hypoxia: Secondary | ICD-10-CM | POA: Diagnosis not present

## 2018-09-16 DIAGNOSIS — M6281 Muscle weakness (generalized): Secondary | ICD-10-CM | POA: Diagnosis not present

## 2018-09-16 DIAGNOSIS — I69898 Other sequelae of other cerebrovascular disease: Secondary | ICD-10-CM | POA: Diagnosis not present

## 2018-09-17 DIAGNOSIS — I69898 Other sequelae of other cerebrovascular disease: Secondary | ICD-10-CM | POA: Diagnosis not present

## 2018-09-17 DIAGNOSIS — M6281 Muscle weakness (generalized): Secondary | ICD-10-CM | POA: Diagnosis not present

## 2018-09-17 DIAGNOSIS — J9611 Chronic respiratory failure with hypoxia: Secondary | ICD-10-CM | POA: Diagnosis not present

## 2018-09-18 DIAGNOSIS — M6281 Muscle weakness (generalized): Secondary | ICD-10-CM | POA: Diagnosis not present

## 2018-09-18 DIAGNOSIS — I69898 Other sequelae of other cerebrovascular disease: Secondary | ICD-10-CM | POA: Diagnosis not present

## 2018-09-18 DIAGNOSIS — J9611 Chronic respiratory failure with hypoxia: Secondary | ICD-10-CM | POA: Diagnosis not present

## 2018-09-19 DIAGNOSIS — I69898 Other sequelae of other cerebrovascular disease: Secondary | ICD-10-CM | POA: Diagnosis not present

## 2018-09-19 DIAGNOSIS — M6281 Muscle weakness (generalized): Secondary | ICD-10-CM | POA: Diagnosis not present

## 2018-09-19 DIAGNOSIS — J9611 Chronic respiratory failure with hypoxia: Secondary | ICD-10-CM | POA: Diagnosis not present

## 2018-09-22 DIAGNOSIS — J9611 Chronic respiratory failure with hypoxia: Secondary | ICD-10-CM | POA: Diagnosis not present

## 2018-09-22 DIAGNOSIS — I69898 Other sequelae of other cerebrovascular disease: Secondary | ICD-10-CM | POA: Diagnosis not present

## 2018-09-22 DIAGNOSIS — M6281 Muscle weakness (generalized): Secondary | ICD-10-CM | POA: Diagnosis not present

## 2018-09-23 DIAGNOSIS — R55 Syncope and collapse: Secondary | ICD-10-CM | POA: Diagnosis not present

## 2018-09-23 DIAGNOSIS — J9611 Chronic respiratory failure with hypoxia: Secondary | ICD-10-CM | POA: Diagnosis not present

## 2018-09-23 DIAGNOSIS — I69898 Other sequelae of other cerebrovascular disease: Secondary | ICD-10-CM | POA: Diagnosis not present

## 2018-09-23 DIAGNOSIS — M6281 Muscle weakness (generalized): Secondary | ICD-10-CM | POA: Diagnosis not present

## 2018-09-23 DIAGNOSIS — I5032 Chronic diastolic (congestive) heart failure: Secondary | ICD-10-CM | POA: Diagnosis not present

## 2018-09-23 DIAGNOSIS — I110 Hypertensive heart disease with heart failure: Secondary | ICD-10-CM | POA: Diagnosis not present

## 2018-09-24 DIAGNOSIS — M6281 Muscle weakness (generalized): Secondary | ICD-10-CM | POA: Diagnosis not present

## 2018-09-24 DIAGNOSIS — J9611 Chronic respiratory failure with hypoxia: Secondary | ICD-10-CM | POA: Diagnosis not present

## 2018-09-24 DIAGNOSIS — I69898 Other sequelae of other cerebrovascular disease: Secondary | ICD-10-CM | POA: Diagnosis not present

## 2018-09-25 DIAGNOSIS — J9611 Chronic respiratory failure with hypoxia: Secondary | ICD-10-CM | POA: Diagnosis not present

## 2018-09-25 DIAGNOSIS — I69898 Other sequelae of other cerebrovascular disease: Secondary | ICD-10-CM | POA: Diagnosis not present

## 2018-09-25 DIAGNOSIS — M6281 Muscle weakness (generalized): Secondary | ICD-10-CM | POA: Diagnosis not present

## 2018-09-26 DIAGNOSIS — I69898 Other sequelae of other cerebrovascular disease: Secondary | ICD-10-CM | POA: Diagnosis not present

## 2018-09-26 DIAGNOSIS — M6281 Muscle weakness (generalized): Secondary | ICD-10-CM | POA: Diagnosis not present

## 2018-09-26 DIAGNOSIS — J9611 Chronic respiratory failure with hypoxia: Secondary | ICD-10-CM | POA: Diagnosis not present

## 2018-09-29 DIAGNOSIS — J9611 Chronic respiratory failure with hypoxia: Secondary | ICD-10-CM | POA: Diagnosis not present

## 2018-09-29 DIAGNOSIS — M6281 Muscle weakness (generalized): Secondary | ICD-10-CM | POA: Diagnosis not present

## 2018-09-29 DIAGNOSIS — I69898 Other sequelae of other cerebrovascular disease: Secondary | ICD-10-CM | POA: Diagnosis not present

## 2018-09-30 DIAGNOSIS — M6281 Muscle weakness (generalized): Secondary | ICD-10-CM | POA: Diagnosis not present

## 2018-09-30 DIAGNOSIS — J9611 Chronic respiratory failure with hypoxia: Secondary | ICD-10-CM | POA: Diagnosis not present

## 2018-09-30 DIAGNOSIS — I69898 Other sequelae of other cerebrovascular disease: Secondary | ICD-10-CM | POA: Diagnosis not present

## 2018-10-01 DIAGNOSIS — J9611 Chronic respiratory failure with hypoxia: Secondary | ICD-10-CM | POA: Diagnosis not present

## 2018-10-01 DIAGNOSIS — I69898 Other sequelae of other cerebrovascular disease: Secondary | ICD-10-CM | POA: Diagnosis not present

## 2018-10-01 DIAGNOSIS — M6281 Muscle weakness (generalized): Secondary | ICD-10-CM | POA: Diagnosis not present

## 2018-10-02 DIAGNOSIS — M6281 Muscle weakness (generalized): Secondary | ICD-10-CM | POA: Diagnosis not present

## 2018-10-02 DIAGNOSIS — I69898 Other sequelae of other cerebrovascular disease: Secondary | ICD-10-CM | POA: Diagnosis not present

## 2018-10-02 DIAGNOSIS — J9611 Chronic respiratory failure with hypoxia: Secondary | ICD-10-CM | POA: Diagnosis not present

## 2018-10-03 DIAGNOSIS — M6281 Muscle weakness (generalized): Secondary | ICD-10-CM | POA: Diagnosis not present

## 2018-10-03 DIAGNOSIS — J9611 Chronic respiratory failure with hypoxia: Secondary | ICD-10-CM | POA: Diagnosis not present

## 2018-10-03 DIAGNOSIS — I69898 Other sequelae of other cerebrovascular disease: Secondary | ICD-10-CM | POA: Diagnosis not present

## 2018-10-06 DIAGNOSIS — I69898 Other sequelae of other cerebrovascular disease: Secondary | ICD-10-CM | POA: Diagnosis not present

## 2018-10-06 DIAGNOSIS — J9611 Chronic respiratory failure with hypoxia: Secondary | ICD-10-CM | POA: Diagnosis not present

## 2018-10-06 DIAGNOSIS — M6281 Muscle weakness (generalized): Secondary | ICD-10-CM | POA: Diagnosis not present

## 2018-10-07 DIAGNOSIS — M6281 Muscle weakness (generalized): Secondary | ICD-10-CM | POA: Diagnosis not present

## 2018-10-07 DIAGNOSIS — I69898 Other sequelae of other cerebrovascular disease: Secondary | ICD-10-CM | POA: Diagnosis not present

## 2018-10-07 DIAGNOSIS — J9611 Chronic respiratory failure with hypoxia: Secondary | ICD-10-CM | POA: Diagnosis not present

## 2018-10-08 DIAGNOSIS — I69898 Other sequelae of other cerebrovascular disease: Secondary | ICD-10-CM | POA: Diagnosis not present

## 2018-10-08 DIAGNOSIS — M6281 Muscle weakness (generalized): Secondary | ICD-10-CM | POA: Diagnosis not present

## 2018-10-08 DIAGNOSIS — J9611 Chronic respiratory failure with hypoxia: Secondary | ICD-10-CM | POA: Diagnosis not present

## 2018-10-09 DIAGNOSIS — I69898 Other sequelae of other cerebrovascular disease: Secondary | ICD-10-CM | POA: Diagnosis not present

## 2018-10-09 DIAGNOSIS — R531 Weakness: Secondary | ICD-10-CM | POA: Diagnosis not present

## 2018-10-09 DIAGNOSIS — M6281 Muscle weakness (generalized): Secondary | ICD-10-CM | POA: Diagnosis not present

## 2018-10-09 DIAGNOSIS — R110 Nausea: Secondary | ICD-10-CM | POA: Diagnosis not present

## 2018-10-09 DIAGNOSIS — D649 Anemia, unspecified: Secondary | ICD-10-CM | POA: Diagnosis not present

## 2018-10-09 DIAGNOSIS — J9611 Chronic respiratory failure with hypoxia: Secondary | ICD-10-CM | POA: Diagnosis not present

## 2018-10-10 DIAGNOSIS — D649 Anemia, unspecified: Secondary | ICD-10-CM | POA: Diagnosis not present

## 2018-10-10 DIAGNOSIS — R531 Weakness: Secondary | ICD-10-CM | POA: Diagnosis not present

## 2018-10-10 DIAGNOSIS — R110 Nausea: Secondary | ICD-10-CM | POA: Diagnosis not present

## 2018-10-10 DIAGNOSIS — M6281 Muscle weakness (generalized): Secondary | ICD-10-CM | POA: Diagnosis not present

## 2018-10-10 DIAGNOSIS — I69898 Other sequelae of other cerebrovascular disease: Secondary | ICD-10-CM | POA: Diagnosis not present

## 2018-10-10 DIAGNOSIS — J9611 Chronic respiratory failure with hypoxia: Secondary | ICD-10-CM | POA: Diagnosis not present

## 2018-10-13 DIAGNOSIS — M6281 Muscle weakness (generalized): Secondary | ICD-10-CM | POA: Diagnosis not present

## 2018-10-13 DIAGNOSIS — I69898 Other sequelae of other cerebrovascular disease: Secondary | ICD-10-CM | POA: Diagnosis not present

## 2018-10-13 DIAGNOSIS — J9611 Chronic respiratory failure with hypoxia: Secondary | ICD-10-CM | POA: Diagnosis not present

## 2018-10-14 DIAGNOSIS — E1151 Type 2 diabetes mellitus with diabetic peripheral angiopathy without gangrene: Secondary | ICD-10-CM | POA: Diagnosis not present

## 2018-10-14 DIAGNOSIS — I69898 Other sequelae of other cerebrovascular disease: Secondary | ICD-10-CM | POA: Diagnosis not present

## 2018-10-14 DIAGNOSIS — J9611 Chronic respiratory failure with hypoxia: Secondary | ICD-10-CM | POA: Diagnosis not present

## 2018-10-14 DIAGNOSIS — L603 Nail dystrophy: Secondary | ICD-10-CM | POA: Diagnosis not present

## 2018-10-14 DIAGNOSIS — B351 Tinea unguium: Secondary | ICD-10-CM | POA: Diagnosis not present

## 2018-10-14 DIAGNOSIS — M6281 Muscle weakness (generalized): Secondary | ICD-10-CM | POA: Diagnosis not present

## 2018-10-15 DIAGNOSIS — I69898 Other sequelae of other cerebrovascular disease: Secondary | ICD-10-CM | POA: Diagnosis not present

## 2018-10-15 DIAGNOSIS — J9611 Chronic respiratory failure with hypoxia: Secondary | ICD-10-CM | POA: Diagnosis not present

## 2018-10-15 DIAGNOSIS — M6281 Muscle weakness (generalized): Secondary | ICD-10-CM | POA: Diagnosis not present

## 2018-10-16 DIAGNOSIS — I10 Essential (primary) hypertension: Secondary | ICD-10-CM | POA: Diagnosis not present

## 2018-10-16 DIAGNOSIS — I5032 Chronic diastolic (congestive) heart failure: Secondary | ICD-10-CM | POA: Diagnosis not present

## 2018-10-16 DIAGNOSIS — N184 Chronic kidney disease, stage 4 (severe): Secondary | ICD-10-CM | POA: Diagnosis not present

## 2018-10-16 DIAGNOSIS — K210 Gastro-esophageal reflux disease with esophagitis: Secondary | ICD-10-CM | POA: Diagnosis not present

## 2018-10-16 DIAGNOSIS — J9611 Chronic respiratory failure with hypoxia: Secondary | ICD-10-CM | POA: Diagnosis not present

## 2018-10-16 DIAGNOSIS — M6281 Muscle weakness (generalized): Secondary | ICD-10-CM | POA: Diagnosis not present

## 2018-10-16 DIAGNOSIS — I69898 Other sequelae of other cerebrovascular disease: Secondary | ICD-10-CM | POA: Diagnosis not present

## 2018-10-17 DIAGNOSIS — M6281 Muscle weakness (generalized): Secondary | ICD-10-CM | POA: Diagnosis not present

## 2018-10-17 DIAGNOSIS — J9611 Chronic respiratory failure with hypoxia: Secondary | ICD-10-CM | POA: Diagnosis not present

## 2018-10-17 DIAGNOSIS — I69898 Other sequelae of other cerebrovascular disease: Secondary | ICD-10-CM | POA: Diagnosis not present

## 2018-10-20 DIAGNOSIS — J9611 Chronic respiratory failure with hypoxia: Secondary | ICD-10-CM | POA: Diagnosis not present

## 2018-10-20 DIAGNOSIS — M6281 Muscle weakness (generalized): Secondary | ICD-10-CM | POA: Diagnosis not present

## 2018-10-20 DIAGNOSIS — I69898 Other sequelae of other cerebrovascular disease: Secondary | ICD-10-CM | POA: Diagnosis not present

## 2018-10-21 DIAGNOSIS — M6281 Muscle weakness (generalized): Secondary | ICD-10-CM | POA: Diagnosis not present

## 2018-10-21 DIAGNOSIS — I69898 Other sequelae of other cerebrovascular disease: Secondary | ICD-10-CM | POA: Diagnosis not present

## 2018-10-21 DIAGNOSIS — J9611 Chronic respiratory failure with hypoxia: Secondary | ICD-10-CM | POA: Diagnosis not present

## 2018-10-22 DIAGNOSIS — I69898 Other sequelae of other cerebrovascular disease: Secondary | ICD-10-CM | POA: Diagnosis not present

## 2018-10-22 DIAGNOSIS — M6281 Muscle weakness (generalized): Secondary | ICD-10-CM | POA: Diagnosis not present

## 2018-10-22 DIAGNOSIS — J9611 Chronic respiratory failure with hypoxia: Secondary | ICD-10-CM | POA: Diagnosis not present

## 2018-10-23 DIAGNOSIS — I69898 Other sequelae of other cerebrovascular disease: Secondary | ICD-10-CM | POA: Diagnosis not present

## 2018-10-23 DIAGNOSIS — J9611 Chronic respiratory failure with hypoxia: Secondary | ICD-10-CM | POA: Diagnosis not present

## 2018-10-23 DIAGNOSIS — M6281 Muscle weakness (generalized): Secondary | ICD-10-CM | POA: Diagnosis not present

## 2018-10-24 DIAGNOSIS — I69898 Other sequelae of other cerebrovascular disease: Secondary | ICD-10-CM | POA: Diagnosis not present

## 2018-10-24 DIAGNOSIS — J9611 Chronic respiratory failure with hypoxia: Secondary | ICD-10-CM | POA: Diagnosis not present

## 2018-10-24 DIAGNOSIS — M6281 Muscle weakness (generalized): Secondary | ICD-10-CM | POA: Diagnosis not present

## 2018-10-26 DIAGNOSIS — R4182 Altered mental status, unspecified: Secondary | ICD-10-CM | POA: Diagnosis not present

## 2018-10-26 DIAGNOSIS — R0989 Other specified symptoms and signs involving the circulatory and respiratory systems: Secondary | ICD-10-CM | POA: Diagnosis not present

## 2018-10-27 DIAGNOSIS — I69898 Other sequelae of other cerebrovascular disease: Secondary | ICD-10-CM | POA: Diagnosis not present

## 2018-10-27 DIAGNOSIS — J9611 Chronic respiratory failure with hypoxia: Secondary | ICD-10-CM | POA: Diagnosis not present

## 2018-10-27 DIAGNOSIS — M6281 Muscle weakness (generalized): Secondary | ICD-10-CM | POA: Diagnosis not present

## 2018-10-28 DIAGNOSIS — S8991XA Unspecified injury of right lower leg, initial encounter: Secondary | ICD-10-CM | POA: Diagnosis not present

## 2018-10-28 DIAGNOSIS — S0990XA Unspecified injury of head, initial encounter: Secondary | ICD-10-CM | POA: Diagnosis not present

## 2018-10-28 DIAGNOSIS — N189 Chronic kidney disease, unspecified: Secondary | ICD-10-CM | POA: Diagnosis not present

## 2018-10-28 DIAGNOSIS — Z794 Long term (current) use of insulin: Secondary | ICD-10-CM | POA: Diagnosis not present

## 2018-10-28 DIAGNOSIS — W07XXXA Fall from chair, initial encounter: Secondary | ICD-10-CM | POA: Diagnosis not present

## 2018-10-28 DIAGNOSIS — S199XXA Unspecified injury of neck, initial encounter: Secondary | ICD-10-CM | POA: Diagnosis not present

## 2018-10-28 DIAGNOSIS — R079 Chest pain, unspecified: Secondary | ICD-10-CM | POA: Diagnosis not present

## 2018-10-28 DIAGNOSIS — Z79899 Other long term (current) drug therapy: Secondary | ICD-10-CM | POA: Diagnosis not present

## 2018-10-28 DIAGNOSIS — I509 Heart failure, unspecified: Secondary | ICD-10-CM | POA: Diagnosis not present

## 2018-10-28 DIAGNOSIS — M25561 Pain in right knee: Secondary | ICD-10-CM | POA: Diagnosis not present

## 2018-10-28 DIAGNOSIS — I69898 Other sequelae of other cerebrovascular disease: Secondary | ICD-10-CM | POA: Diagnosis not present

## 2018-10-28 DIAGNOSIS — M542 Cervicalgia: Secondary | ICD-10-CM | POA: Diagnosis not present

## 2018-10-28 DIAGNOSIS — S72009A Fracture of unspecified part of neck of unspecified femur, initial encounter for closed fracture: Secondary | ICD-10-CM | POA: Diagnosis not present

## 2018-10-28 DIAGNOSIS — Z885 Allergy status to narcotic agent status: Secondary | ICD-10-CM | POA: Diagnosis not present

## 2018-10-28 DIAGNOSIS — M79604 Pain in right leg: Secondary | ICD-10-CM | POA: Diagnosis not present

## 2018-10-28 DIAGNOSIS — Z7901 Long term (current) use of anticoagulants: Secondary | ICD-10-CM | POA: Diagnosis not present

## 2018-10-28 DIAGNOSIS — I130 Hypertensive heart and chronic kidney disease with heart failure and stage 1 through stage 4 chronic kidney disease, or unspecified chronic kidney disease: Secondary | ICD-10-CM | POA: Diagnosis not present

## 2018-10-28 DIAGNOSIS — M6281 Muscle weakness (generalized): Secondary | ICD-10-CM | POA: Diagnosis not present

## 2018-10-28 DIAGNOSIS — S299XXA Unspecified injury of thorax, initial encounter: Secondary | ICD-10-CM | POA: Diagnosis not present

## 2018-10-28 DIAGNOSIS — M4802 Spinal stenosis, cervical region: Secondary | ICD-10-CM | POA: Diagnosis not present

## 2018-10-28 DIAGNOSIS — M25551 Pain in right hip: Secondary | ICD-10-CM | POA: Diagnosis not present

## 2018-10-28 DIAGNOSIS — I10 Essential (primary) hypertension: Secondary | ICD-10-CM | POA: Diagnosis not present

## 2018-10-28 DIAGNOSIS — Z880 Allergy status to penicillin: Secondary | ICD-10-CM | POA: Diagnosis not present

## 2018-10-28 DIAGNOSIS — Z7982 Long term (current) use of aspirin: Secondary | ICD-10-CM | POA: Diagnosis not present

## 2018-10-28 DIAGNOSIS — M1711 Unilateral primary osteoarthritis, right knee: Secondary | ICD-10-CM | POA: Diagnosis not present

## 2018-10-28 DIAGNOSIS — I6782 Cerebral ischemia: Secondary | ICD-10-CM | POA: Diagnosis not present

## 2018-10-28 DIAGNOSIS — I700 Atherosclerosis of aorta: Secondary | ICD-10-CM | POA: Diagnosis not present

## 2018-10-28 DIAGNOSIS — N3001 Acute cystitis with hematuria: Secondary | ICD-10-CM | POA: Diagnosis not present

## 2018-10-28 DIAGNOSIS — G8911 Acute pain due to trauma: Secondary | ICD-10-CM | POA: Diagnosis not present

## 2018-10-28 DIAGNOSIS — Z87891 Personal history of nicotine dependence: Secondary | ICD-10-CM | POA: Diagnosis not present

## 2018-10-28 DIAGNOSIS — J449 Chronic obstructive pulmonary disease, unspecified: Secondary | ICD-10-CM | POA: Diagnosis not present

## 2018-10-28 DIAGNOSIS — J9611 Chronic respiratory failure with hypoxia: Secondary | ICD-10-CM | POA: Diagnosis not present

## 2018-10-28 DIAGNOSIS — R0689 Other abnormalities of breathing: Secondary | ICD-10-CM | POA: Diagnosis not present

## 2018-10-28 DIAGNOSIS — R52 Pain, unspecified: Secondary | ICD-10-CM | POA: Diagnosis not present

## 2018-10-28 DIAGNOSIS — S79911A Unspecified injury of right hip, initial encounter: Secondary | ICD-10-CM | POA: Diagnosis not present

## 2018-10-29 DIAGNOSIS — Z7401 Bed confinement status: Secondary | ICD-10-CM | POA: Diagnosis not present

## 2018-10-29 DIAGNOSIS — W19XXXA Unspecified fall, initial encounter: Secondary | ICD-10-CM | POA: Diagnosis not present

## 2018-10-29 DIAGNOSIS — R404 Transient alteration of awareness: Secondary | ICD-10-CM | POA: Diagnosis not present

## 2018-10-29 DIAGNOSIS — J9611 Chronic respiratory failure with hypoxia: Secondary | ICD-10-CM | POA: Diagnosis not present

## 2018-10-29 DIAGNOSIS — R52 Pain, unspecified: Secondary | ICD-10-CM | POA: Diagnosis not present

## 2018-10-29 DIAGNOSIS — M6281 Muscle weakness (generalized): Secondary | ICD-10-CM | POA: Diagnosis not present

## 2018-10-29 DIAGNOSIS — I69898 Other sequelae of other cerebrovascular disease: Secondary | ICD-10-CM | POA: Diagnosis not present

## 2018-10-31 DIAGNOSIS — M6281 Muscle weakness (generalized): Secondary | ICD-10-CM | POA: Diagnosis not present

## 2018-10-31 DIAGNOSIS — I69898 Other sequelae of other cerebrovascular disease: Secondary | ICD-10-CM | POA: Diagnosis not present

## 2018-10-31 DIAGNOSIS — J9611 Chronic respiratory failure with hypoxia: Secondary | ICD-10-CM | POA: Diagnosis not present

## 2018-11-01 DIAGNOSIS — Z66 Do not resuscitate: Secondary | ICD-10-CM | POA: Diagnosis not present

## 2018-11-01 DIAGNOSIS — Z7982 Long term (current) use of aspirin: Secondary | ICD-10-CM | POA: Diagnosis not present

## 2018-11-01 DIAGNOSIS — Z87891 Personal history of nicotine dependence: Secondary | ICD-10-CM | POA: Diagnosis not present

## 2018-11-01 DIAGNOSIS — R0902 Hypoxemia: Secondary | ICD-10-CM | POA: Diagnosis not present

## 2018-11-01 DIAGNOSIS — I2510 Atherosclerotic heart disease of native coronary artery without angina pectoris: Secondary | ICD-10-CM | POA: Diagnosis present

## 2018-11-01 DIAGNOSIS — Z794 Long term (current) use of insulin: Secondary | ICD-10-CM | POA: Diagnosis not present

## 2018-11-01 DIAGNOSIS — M6281 Muscle weakness (generalized): Secondary | ICD-10-CM | POA: Diagnosis not present

## 2018-11-01 DIAGNOSIS — I69898 Other sequelae of other cerebrovascular disease: Secondary | ICD-10-CM | POA: Diagnosis not present

## 2018-11-01 DIAGNOSIS — K219 Gastro-esophageal reflux disease without esophagitis: Secondary | ICD-10-CM | POA: Diagnosis present

## 2018-11-01 DIAGNOSIS — R404 Transient alteration of awareness: Secondary | ICD-10-CM | POA: Diagnosis not present

## 2018-11-01 DIAGNOSIS — I10 Essential (primary) hypertension: Secondary | ICD-10-CM | POA: Diagnosis not present

## 2018-11-01 DIAGNOSIS — J449 Chronic obstructive pulmonary disease, unspecified: Secondary | ICD-10-CM | POA: Diagnosis present

## 2018-11-01 DIAGNOSIS — I5043 Acute on chronic combined systolic (congestive) and diastolic (congestive) heart failure: Secondary | ICD-10-CM | POA: Diagnosis present

## 2018-11-01 DIAGNOSIS — N184 Chronic kidney disease, stage 4 (severe): Secondary | ICD-10-CM | POA: Diagnosis present

## 2018-11-01 DIAGNOSIS — R05 Cough: Secondary | ICD-10-CM | POA: Diagnosis not present

## 2018-11-01 DIAGNOSIS — J9611 Chronic respiratory failure with hypoxia: Secondary | ICD-10-CM | POA: Diagnosis not present

## 2018-11-01 DIAGNOSIS — I130 Hypertensive heart and chronic kidney disease with heart failure and stage 1 through stage 4 chronic kidney disease, or unspecified chronic kidney disease: Secondary | ICD-10-CM | POA: Diagnosis present

## 2018-11-01 DIAGNOSIS — N959 Unspecified menopausal and perimenopausal disorder: Secondary | ICD-10-CM | POA: Diagnosis present

## 2018-11-01 DIAGNOSIS — Z515 Encounter for palliative care: Secondary | ICD-10-CM | POA: Diagnosis not present

## 2018-11-01 DIAGNOSIS — Z7901 Long term (current) use of anticoagulants: Secondary | ICD-10-CM | POA: Diagnosis not present

## 2018-11-01 DIAGNOSIS — I161 Hypertensive emergency: Secondary | ICD-10-CM | POA: Diagnosis not present

## 2018-11-01 DIAGNOSIS — E1122 Type 2 diabetes mellitus with diabetic chronic kidney disease: Secondary | ICD-10-CM | POA: Diagnosis present

## 2018-11-01 DIAGNOSIS — E785 Hyperlipidemia, unspecified: Secondary | ICD-10-CM | POA: Diagnosis present

## 2018-11-07 DIAGNOSIS — R531 Weakness: Secondary | ICD-10-CM | POA: Diagnosis not present

## 2018-11-07 DIAGNOSIS — R0602 Shortness of breath: Secondary | ICD-10-CM | POA: Diagnosis not present

## 2018-11-07 DIAGNOSIS — E118 Type 2 diabetes mellitus with unspecified complications: Secondary | ICD-10-CM | POA: Diagnosis not present

## 2018-11-07 DIAGNOSIS — R52 Pain, unspecified: Secondary | ICD-10-CM | POA: Diagnosis not present

## 2018-11-07 DIAGNOSIS — I509 Heart failure, unspecified: Secondary | ICD-10-CM | POA: Diagnosis not present

## 2018-11-07 DIAGNOSIS — J449 Chronic obstructive pulmonary disease, unspecified: Secondary | ICD-10-CM | POA: Diagnosis not present

## 2018-11-07 DIAGNOSIS — I5022 Chronic systolic (congestive) heart failure: Secondary | ICD-10-CM | POA: Diagnosis not present

## 2018-11-07 DIAGNOSIS — J441 Chronic obstructive pulmonary disease with (acute) exacerbation: Secondary | ICD-10-CM | POA: Diagnosis not present

## 2018-11-07 DIAGNOSIS — I519 Heart disease, unspecified: Secondary | ICD-10-CM | POA: Diagnosis not present

## 2018-11-07 DIAGNOSIS — E785 Hyperlipidemia, unspecified: Secondary | ICD-10-CM | POA: Diagnosis not present

## 2018-11-07 DIAGNOSIS — I161 Hypertensive emergency: Secondary | ICD-10-CM | POA: Diagnosis not present

## 2018-11-07 DIAGNOSIS — E119 Type 2 diabetes mellitus without complications: Secondary | ICD-10-CM | POA: Diagnosis not present

## 2018-11-09 DIAGNOSIS — R52 Pain, unspecified: Secondary | ICD-10-CM | POA: Diagnosis not present

## 2018-11-09 DIAGNOSIS — I509 Heart failure, unspecified: Secondary | ICD-10-CM | POA: Diagnosis not present

## 2018-11-09 DIAGNOSIS — I519 Heart disease, unspecified: Secondary | ICD-10-CM | POA: Diagnosis not present

## 2018-11-09 DIAGNOSIS — E785 Hyperlipidemia, unspecified: Secondary | ICD-10-CM | POA: Diagnosis not present

## 2018-11-09 DIAGNOSIS — J449 Chronic obstructive pulmonary disease, unspecified: Secondary | ICD-10-CM | POA: Diagnosis not present

## 2018-11-09 DIAGNOSIS — E118 Type 2 diabetes mellitus with unspecified complications: Secondary | ICD-10-CM | POA: Diagnosis not present

## 2018-11-10 DIAGNOSIS — I509 Heart failure, unspecified: Secondary | ICD-10-CM | POA: Diagnosis not present

## 2018-11-10 DIAGNOSIS — E785 Hyperlipidemia, unspecified: Secondary | ICD-10-CM | POA: Diagnosis not present

## 2018-11-10 DIAGNOSIS — E118 Type 2 diabetes mellitus with unspecified complications: Secondary | ICD-10-CM | POA: Diagnosis not present

## 2018-11-10 DIAGNOSIS — R52 Pain, unspecified: Secondary | ICD-10-CM | POA: Diagnosis not present

## 2018-11-10 DIAGNOSIS — I519 Heart disease, unspecified: Secondary | ICD-10-CM | POA: Diagnosis not present

## 2018-11-10 DIAGNOSIS — J449 Chronic obstructive pulmonary disease, unspecified: Secondary | ICD-10-CM | POA: Diagnosis not present

## 2018-11-19 DIAGNOSIS — R531 Weakness: Secondary | ICD-10-CM | POA: Diagnosis not present

## 2018-11-19 DIAGNOSIS — R52 Pain, unspecified: Secondary | ICD-10-CM | POA: Diagnosis not present

## 2018-11-19 DIAGNOSIS — J449 Chronic obstructive pulmonary disease, unspecified: Secondary | ICD-10-CM | POA: Diagnosis not present

## 2018-11-19 DIAGNOSIS — E785 Hyperlipidemia, unspecified: Secondary | ICD-10-CM | POA: Diagnosis not present

## 2018-11-19 DIAGNOSIS — R0602 Shortness of breath: Secondary | ICD-10-CM | POA: Diagnosis not present

## 2018-11-19 DIAGNOSIS — I509 Heart failure, unspecified: Secondary | ICD-10-CM | POA: Diagnosis not present

## 2018-11-19 DIAGNOSIS — E118 Type 2 diabetes mellitus with unspecified complications: Secondary | ICD-10-CM | POA: Diagnosis not present

## 2018-11-19 DIAGNOSIS — I519 Heart disease, unspecified: Secondary | ICD-10-CM | POA: Diagnosis not present

## 2018-11-25 DIAGNOSIS — I509 Heart failure, unspecified: Secondary | ICD-10-CM | POA: Diagnosis not present

## 2018-11-25 DIAGNOSIS — E118 Type 2 diabetes mellitus with unspecified complications: Secondary | ICD-10-CM | POA: Diagnosis not present

## 2018-11-25 DIAGNOSIS — R52 Pain, unspecified: Secondary | ICD-10-CM | POA: Diagnosis not present

## 2018-11-25 DIAGNOSIS — J449 Chronic obstructive pulmonary disease, unspecified: Secondary | ICD-10-CM | POA: Diagnosis not present

## 2018-11-25 DIAGNOSIS — E785 Hyperlipidemia, unspecified: Secondary | ICD-10-CM | POA: Diagnosis not present

## 2018-11-25 DIAGNOSIS — I519 Heart disease, unspecified: Secondary | ICD-10-CM | POA: Diagnosis not present

## 2018-11-27 ENCOUNTER — Other Ambulatory Visit: Payer: Self-pay | Admitting: *Deleted

## 2018-11-27 NOTE — Patient Outreach (Signed)
[facility]) Care Management  11/27/2018  Krystal [name] 05/26/1936 [id number]   Spoke with Roanna Epley, discharge planner at facility.  Patient is there under Hospice care.  No plans to discharge at this time. Plan to sign off as not [facility] care management needs identified.  [name]. [name], MSN, RN, AGPC-N, [place] [phone]) Business Cell  [phone]) Toll Free Office

## 2018-11-28 DIAGNOSIS — E118 Type 2 diabetes mellitus with unspecified complications: Secondary | ICD-10-CM | POA: Diagnosis not present

## 2018-11-28 DIAGNOSIS — I509 Heart failure, unspecified: Secondary | ICD-10-CM | POA: Diagnosis not present

## 2018-11-28 DIAGNOSIS — J449 Chronic obstructive pulmonary disease, unspecified: Secondary | ICD-10-CM | POA: Diagnosis not present

## 2018-11-28 DIAGNOSIS — I519 Heart disease, unspecified: Secondary | ICD-10-CM | POA: Diagnosis not present

## 2018-11-28 DIAGNOSIS — E785 Hyperlipidemia, unspecified: Secondary | ICD-10-CM | POA: Diagnosis not present

## 2018-11-28 DIAGNOSIS — R52 Pain, unspecified: Secondary | ICD-10-CM | POA: Diagnosis not present

## 2018-12-02 DIAGNOSIS — E118 Type 2 diabetes mellitus with unspecified complications: Secondary | ICD-10-CM | POA: Diagnosis not present

## 2018-12-02 DIAGNOSIS — I509 Heart failure, unspecified: Secondary | ICD-10-CM | POA: Diagnosis not present

## 2018-12-02 DIAGNOSIS — J449 Chronic obstructive pulmonary disease, unspecified: Secondary | ICD-10-CM | POA: Diagnosis not present

## 2018-12-02 DIAGNOSIS — E785 Hyperlipidemia, unspecified: Secondary | ICD-10-CM | POA: Diagnosis not present

## 2018-12-02 DIAGNOSIS — I519 Heart disease, unspecified: Secondary | ICD-10-CM | POA: Diagnosis not present

## 2018-12-02 DIAGNOSIS — R52 Pain, unspecified: Secondary | ICD-10-CM | POA: Diagnosis not present

## 2018-12-03 DIAGNOSIS — R52 Pain, unspecified: Secondary | ICD-10-CM | POA: Diagnosis not present

## 2018-12-03 DIAGNOSIS — I509 Heart failure, unspecified: Secondary | ICD-10-CM | POA: Diagnosis not present

## 2018-12-03 DIAGNOSIS — E118 Type 2 diabetes mellitus with unspecified complications: Secondary | ICD-10-CM | POA: Diagnosis not present

## 2018-12-03 DIAGNOSIS — J449 Chronic obstructive pulmonary disease, unspecified: Secondary | ICD-10-CM | POA: Diagnosis not present

## 2018-12-03 DIAGNOSIS — I519 Heart disease, unspecified: Secondary | ICD-10-CM | POA: Diagnosis not present

## 2018-12-03 DIAGNOSIS — E785 Hyperlipidemia, unspecified: Secondary | ICD-10-CM | POA: Diagnosis not present

## 2018-12-04 DIAGNOSIS — I509 Heart failure, unspecified: Secondary | ICD-10-CM | POA: Diagnosis not present

## 2018-12-04 DIAGNOSIS — I519 Heart disease, unspecified: Secondary | ICD-10-CM | POA: Diagnosis not present

## 2018-12-04 DIAGNOSIS — E118 Type 2 diabetes mellitus with unspecified complications: Secondary | ICD-10-CM | POA: Diagnosis not present

## 2018-12-04 DIAGNOSIS — J449 Chronic obstructive pulmonary disease, unspecified: Secondary | ICD-10-CM | POA: Diagnosis not present

## 2018-12-04 DIAGNOSIS — R52 Pain, unspecified: Secondary | ICD-10-CM | POA: Diagnosis not present

## 2018-12-04 DIAGNOSIS — E785 Hyperlipidemia, unspecified: Secondary | ICD-10-CM | POA: Diagnosis not present

## 2018-12-05 DIAGNOSIS — E785 Hyperlipidemia, unspecified: Secondary | ICD-10-CM | POA: Diagnosis not present

## 2018-12-05 DIAGNOSIS — I519 Heart disease, unspecified: Secondary | ICD-10-CM | POA: Diagnosis not present

## 2018-12-05 DIAGNOSIS — R52 Pain, unspecified: Secondary | ICD-10-CM | POA: Diagnosis not present

## 2018-12-05 DIAGNOSIS — J449 Chronic obstructive pulmonary disease, unspecified: Secondary | ICD-10-CM | POA: Diagnosis not present

## 2018-12-05 DIAGNOSIS — I509 Heart failure, unspecified: Secondary | ICD-10-CM | POA: Diagnosis not present

## 2018-12-05 DIAGNOSIS — E118 Type 2 diabetes mellitus with unspecified complications: Secondary | ICD-10-CM | POA: Diagnosis not present

## 2018-12-06 DIAGNOSIS — J449 Chronic obstructive pulmonary disease, unspecified: Secondary | ICD-10-CM | POA: Diagnosis not present

## 2018-12-06 DIAGNOSIS — R52 Pain, unspecified: Secondary | ICD-10-CM | POA: Diagnosis not present

## 2018-12-06 DIAGNOSIS — I509 Heart failure, unspecified: Secondary | ICD-10-CM | POA: Diagnosis not present

## 2018-12-06 DIAGNOSIS — I519 Heart disease, unspecified: Secondary | ICD-10-CM | POA: Diagnosis not present

## 2018-12-06 DIAGNOSIS — E785 Hyperlipidemia, unspecified: Secondary | ICD-10-CM | POA: Diagnosis not present

## 2018-12-06 DIAGNOSIS — E118 Type 2 diabetes mellitus with unspecified complications: Secondary | ICD-10-CM | POA: Diagnosis not present

## 2018-12-07 DIAGNOSIS — I519 Heart disease, unspecified: Secondary | ICD-10-CM | POA: Diagnosis not present

## 2018-12-07 DIAGNOSIS — E118 Type 2 diabetes mellitus with unspecified complications: Secondary | ICD-10-CM | POA: Diagnosis not present

## 2018-12-07 DIAGNOSIS — I509 Heart failure, unspecified: Secondary | ICD-10-CM | POA: Diagnosis not present

## 2018-12-07 DIAGNOSIS — J449 Chronic obstructive pulmonary disease, unspecified: Secondary | ICD-10-CM | POA: Diagnosis not present

## 2018-12-07 DIAGNOSIS — E785 Hyperlipidemia, unspecified: Secondary | ICD-10-CM | POA: Diagnosis not present

## 2018-12-07 DIAGNOSIS — R52 Pain, unspecified: Secondary | ICD-10-CM | POA: Diagnosis not present

## 2018-12-08 DIAGNOSIS — E785 Hyperlipidemia, unspecified: Secondary | ICD-10-CM | POA: Diagnosis not present

## 2018-12-08 DIAGNOSIS — J449 Chronic obstructive pulmonary disease, unspecified: Secondary | ICD-10-CM | POA: Diagnosis not present

## 2018-12-08 DIAGNOSIS — I509 Heart failure, unspecified: Secondary | ICD-10-CM | POA: Diagnosis not present

## 2018-12-08 DIAGNOSIS — I519 Heart disease, unspecified: Secondary | ICD-10-CM | POA: Diagnosis not present

## 2018-12-08 DIAGNOSIS — E118 Type 2 diabetes mellitus with unspecified complications: Secondary | ICD-10-CM | POA: Diagnosis not present

## 2018-12-08 DIAGNOSIS — R52 Pain, unspecified: Secondary | ICD-10-CM | POA: Diagnosis not present

## 2018-12-20 DEATH — deceased

## 2021-09-19 ENCOUNTER — Encounter: Payer: Self-pay | Admitting: *Deleted
